# Patient Record
Sex: Female | Born: 1945 | Race: Black or African American | Hispanic: No | Smoking: Never smoker
Health system: Southern US, Community
[De-identification: ages and names within clinical notes are randomized; demographics above are authoritative.]

## PROBLEM LIST (undated history)

## (undated) DIAGNOSIS — I493 Ventricular premature depolarization: Secondary | ICD-10-CM

## (undated) DIAGNOSIS — I491 Atrial premature depolarization: Secondary | ICD-10-CM

## (undated) DIAGNOSIS — Z9289 Personal history of other medical treatment: Secondary | ICD-10-CM

## (undated) DIAGNOSIS — E785 Hyperlipidemia, unspecified: Secondary | ICD-10-CM

## (undated) DIAGNOSIS — I050 Rheumatic mitral stenosis: Secondary | ICD-10-CM

## (undated) DIAGNOSIS — M1990 Unspecified osteoarthritis, unspecified site: Secondary | ICD-10-CM

## (undated) DIAGNOSIS — I340 Nonrheumatic mitral (valve) insufficiency: Secondary | ICD-10-CM

## (undated) DIAGNOSIS — E11319 Type 2 diabetes mellitus with unspecified diabetic retinopathy without macular edema: Secondary | ICD-10-CM

## (undated) DIAGNOSIS — R937 Abnormal findings on diagnostic imaging of other parts of musculoskeletal system: Secondary | ICD-10-CM

## (undated) DIAGNOSIS — N200 Calculus of kidney: Secondary | ICD-10-CM

## (undated) DIAGNOSIS — E669 Obesity, unspecified: Secondary | ICD-10-CM

## (undated) DIAGNOSIS — I421 Obstructive hypertrophic cardiomyopathy: Secondary | ICD-10-CM

## (undated) DIAGNOSIS — E1165 Type 2 diabetes mellitus with hyperglycemia: Secondary | ICD-10-CM

## (undated) DIAGNOSIS — I5032 Chronic diastolic (congestive) heart failure: Secondary | ICD-10-CM

## (undated) DIAGNOSIS — K219 Gastro-esophageal reflux disease without esophagitis: Secondary | ICD-10-CM

## (undated) DIAGNOSIS — I10 Essential (primary) hypertension: Secondary | ICD-10-CM

## (undated) DIAGNOSIS — J309 Allergic rhinitis, unspecified: Secondary | ICD-10-CM

## (undated) DIAGNOSIS — C539 Malignant neoplasm of cervix uteri, unspecified: Secondary | ICD-10-CM

## (undated) HISTORY — DX: Obesity, unspecified: E66.9

## (undated) HISTORY — DX: Personal history of other medical treatment: Z92.89

## (undated) HISTORY — DX: Abnormal findings on diagnostic imaging of other parts of musculoskeletal system: R93.7

## (undated) HISTORY — DX: Hyperlipidemia, unspecified: E78.5

## (undated) HISTORY — PX: OTHER SURGICAL HISTORY: SHX169

## (undated) HISTORY — DX: Allergic rhinitis, unspecified: J30.9

## (undated) HISTORY — DX: Gastro-esophageal reflux disease without esophagitis: K21.9

## (undated) HISTORY — DX: Chronic diastolic (congestive) heart failure: I50.32

## (undated) HISTORY — DX: Essential (primary) hypertension: I10

## (undated) HISTORY — DX: Obstructive hypertrophic cardiomyopathy: I42.1

## (undated) HISTORY — PX: JOINT REPLACEMENT: SHX530

## (undated) HISTORY — DX: Unspecified osteoarthritis, unspecified site: M19.90

## (undated) HISTORY — DX: Calculus of kidney: N20.0

## (undated) HISTORY — PX: ABDOMINAL HYSTERECTOMY: SHX81

## (undated) HISTORY — DX: Type 2 diabetes mellitus with hyperglycemia: E11.65

---

## 1978-05-15 DIAGNOSIS — C539 Malignant neoplasm of cervix uteri, unspecified: Secondary | ICD-10-CM

## 1978-05-15 HISTORY — DX: Malignant neoplasm of cervix uteri, unspecified: C53.9

## 2011-07-24 ENCOUNTER — Ambulatory Visit: Payer: Self-pay | Admitting: Internal Medicine

## 2011-09-10 ENCOUNTER — Encounter: Payer: Self-pay | Admitting: Internal Medicine

## 2011-09-10 DIAGNOSIS — Z0001 Encounter for general adult medical examination with abnormal findings: Secondary | ICD-10-CM | POA: Insufficient documentation

## 2011-09-13 ENCOUNTER — Other Ambulatory Visit (INDEPENDENT_AMBULATORY_CARE_PROVIDER_SITE_OTHER): Payer: Medicare HMO

## 2011-09-13 ENCOUNTER — Encounter: Payer: Self-pay | Admitting: Internal Medicine

## 2011-09-13 ENCOUNTER — Ambulatory Visit (INDEPENDENT_AMBULATORY_CARE_PROVIDER_SITE_OTHER)
Admission: RE | Admit: 2011-09-13 | Discharge: 2011-09-13 | Disposition: A | Discharge status: Home / Self Care | Payer: Medicare HMO | Source: Ambulatory Visit | Attending: Internal Medicine | Admitting: Internal Medicine

## 2011-09-13 ENCOUNTER — Ambulatory Visit (INDEPENDENT_AMBULATORY_CARE_PROVIDER_SITE_OTHER): Payer: Medicare HMO | Admitting: Internal Medicine

## 2011-09-13 ENCOUNTER — Other Ambulatory Visit: Payer: Self-pay | Admitting: Internal Medicine

## 2011-09-13 VITALS — BP 112/82 | HR 94 | Temp 98.3°F | Ht 60.0 in | Wt 226.0 lb

## 2011-09-13 DIAGNOSIS — M1990 Unspecified osteoarthritis, unspecified site: Secondary | ICD-10-CM | POA: Insufficient documentation

## 2011-09-13 DIAGNOSIS — R059 Cough, unspecified: Secondary | ICD-10-CM

## 2011-09-13 DIAGNOSIS — Z0000 Encounter for general adult medical examination without abnormal findings: Secondary | ICD-10-CM

## 2011-09-13 DIAGNOSIS — J4530 Mild persistent asthma, uncomplicated: Secondary | ICD-10-CM | POA: Insufficient documentation

## 2011-09-13 DIAGNOSIS — I10 Essential (primary) hypertension: Secondary | ICD-10-CM

## 2011-09-13 DIAGNOSIS — R05 Cough: Secondary | ICD-10-CM

## 2011-09-13 DIAGNOSIS — I509 Heart failure, unspecified: Secondary | ICD-10-CM

## 2011-09-13 DIAGNOSIS — N200 Calculus of kidney: Secondary | ICD-10-CM | POA: Insufficient documentation

## 2011-09-13 DIAGNOSIS — C539 Malignant neoplasm of cervix uteri, unspecified: Secondary | ICD-10-CM | POA: Insufficient documentation

## 2011-09-13 DIAGNOSIS — M25561 Pain in right knee: Secondary | ICD-10-CM

## 2011-09-13 DIAGNOSIS — K219 Gastro-esophageal reflux disease without esophagitis: Secondary | ICD-10-CM | POA: Insufficient documentation

## 2011-09-13 DIAGNOSIS — I519 Heart disease, unspecified: Secondary | ICD-10-CM | POA: Insufficient documentation

## 2011-09-13 DIAGNOSIS — M25569 Pain in unspecified knee: Secondary | ICD-10-CM

## 2011-09-13 LAB — HEPATIC FUNCTION PANEL
ALT: 20 U/L (ref 0–35)
AST: 18 U/L (ref 0–37)
Albumin: 4.2 g/dL (ref 3.5–5.2)
Alkaline Phosphatase: 66 U/L (ref 39–117)
Total Bilirubin: 0.7 mg/dL (ref 0.3–1.2)

## 2011-09-13 LAB — URINALYSIS, ROUTINE W REFLEX MICROSCOPIC
Bilirubin Urine: NEGATIVE
Hgb urine dipstick: NEGATIVE
Ketones, ur: NEGATIVE
Leukocytes, UA: NEGATIVE
Nitrite: NEGATIVE
Urobilinogen, UA: 0.2 (ref 0.0–1.0)

## 2011-09-13 LAB — CBC WITH DIFFERENTIAL/PLATELET
Basophils Absolute: 0 10*3/uL (ref 0.0–0.1)
Eosinophils Relative: 2.5 % (ref 0.0–5.0)
HCT: 36.7 % (ref 36.0–46.0)
Lymphs Abs: 1.6 10*3/uL (ref 0.7–4.0)
MCV: 83.2 fl (ref 78.0–100.0)
Monocytes Absolute: 0.4 10*3/uL (ref 0.1–1.0)
Neutrophils Relative %: 61.9 % (ref 43.0–77.0)
Platelets: 235 10*3/uL (ref 150.0–400.0)
RDW: 16.6 % — ABNORMAL HIGH (ref 11.5–14.6)
WBC: 5.8 10*3/uL (ref 4.5–10.5)

## 2011-09-13 LAB — LIPID PANEL
Cholesterol: 198 mg/dL (ref 0–200)
HDL: 60.6 mg/dL (ref >39.00)
LDL Cholesterol: 124 mg/dL — ABNORMAL HIGH (ref 0–99)
Total CHOL/HDL Ratio: 3
Triglycerides: 69 mg/dL (ref 0.0–149.0)
VLDL: 13.8 mg/dL (ref 0.0–40.0)

## 2011-09-13 LAB — BASIC METABOLIC PANEL
BUN: 12 mg/dL (ref 6–23)
CO2: 28 mEq/L (ref 19–32)
Chloride: 108 mEq/L (ref 96–112)
Creatinine, Ser: 0.8 mg/dL (ref 0.4–1.2)
Glucose, Bld: 103 mg/dL — ABNORMAL HIGH (ref 70–99)

## 2011-09-13 LAB — TSH: TSH: 0.61 u[IU]/mL (ref 0.35–5.50)

## 2011-09-13 MED ORDER — LOVASTATIN 40 MG PO TABS: 40.0000 mg | ORAL_TABLET | Freq: Every day | ORAL | Status: DC

## 2011-09-13 MED ORDER — MECLIZINE HCL 25 MG PO TABS: 25.0000 mg | ORAL_TABLET | Freq: Three times a day (TID) | ORAL | Status: DC | PRN

## 2011-09-13 MED ORDER — METOPROLOL TARTRATE 50 MG PO TABS: 50.0000 mg | ORAL_TABLET | Freq: Two times a day (BID) | ORAL | Status: DC

## 2011-09-13 MED ORDER — FUROSEMIDE 40 MG PO TABS: 40.0000 mg | ORAL_TABLET | Freq: Two times a day (BID) | ORAL | Status: DC

## 2011-09-13 MED ORDER — ALBUTEROL SULFATE HFA 108 (90 BASE) MCG/ACT IN AERS: 2.0000 | INHALATION_SPRAY | Freq: Two times a day (BID) | RESPIRATORY_TRACT | Status: DC

## 2011-09-13 MED ORDER — VALSARTAN 320 MG PO TABS: 320.0000 mg | ORAL_TABLET | Freq: Every day | ORAL | Status: DC

## 2011-09-13 NOTE — Assessment & Plan Note (Addendum)
Unclear etiology, for cxr today, exam benign

## 2011-09-13 NOTE — Assessment & Plan Note (Signed)
S/p right TKR, last seen per ortho about 2009, severe limp to walk with cane, for ortho eval as well

## 2011-09-13 NOTE — Assessment & Plan Note (Signed)

## 2011-09-13 NOTE — Patient Instructions (Addendum)
Please remember to followup with your GYN for the yearly pap smear and/or mammogram - please consider [facility] (Dr Audie Box and others) as well as [facility] on [address] for the mammogram You will be contacted regarding the referral for: cardiology, orthopedic, and colonoscopy Please go to XRAY in the Basement for the x-ray test Please go to LAB in the Basement for the blood and/or urine tests to be done today You will be contacted by phone if any changes need to be made immediately.  Otherwise, you will receive a letter about your results with an explanation. OK to stop the benicar Please start the diovan 320 mg per day Please increase the lasix to 40 mg twice per day, with the second dose being as needed for persistent swelling Continue all other medications as before You are given the refills today - all sent to the pharmacy Please also consider getting compression stockings at the [facility] on [address], to help the leg swelling Please return in 6 months, or sooner if needed

## 2011-09-13 NOTE — Assessment & Plan Note (Signed)
benicar too expensive - to change to diovan 320 qd, o/w stable BP Readings from Last 3 Encounters:  09/13/11 112/82

## 2011-09-13 NOTE — Assessment & Plan Note (Signed)
With periph edema  - to change the lasix 40 bid with second dose per day prn; refer cardiology

## 2011-09-13 NOTE — Progress Notes (Signed)
Subjective:    Patient ID: Alyssa Ingram, female    DOB: Jul 30, 1945, 66 y.o.   MRN: [id number]  HPI  Here for wellness and to establish as new pt, moved from [facility] [place], there had PCP, cardiologist;  Overall doing ok;  Pt denies CP, worsening SOB, DOE, wheezing, orthopnea, PND, worsening LE edema, palpitations, dizziness or syncope, except has bilat persistent LE swelling.  Pt denies neurological change such as new Headache, facial or extremity weakness.  Pt denies polydipsia, polyuria, or low sugar symptoms. Pt states overall good compliance with treatment and medications, good tolerability, and trying to follow lower cholesterol diet.  Pt denies worsening depressive symptoms, suicidal ideation or panic. No fever, wt loss, night sweats, loss of appetite, or other constitutional symptoms.  Pt states good ability with ADL's, low fall risk, home safety reviewed and adequate, no significant changes in hearing or vision, and occasionally active with exercise. Walks with cane. No other acute complaints.  Pt denies fever, wt loss, night sweats, loss of appetite, or other constitutional . Benicar too expensive - asks for change.   Has hx of GERD but Denies worsening reflux, dysphagia, abd pain, n/v, bowel change or blood.  Does have ongoing nonprod cough however, not clear if related.  No worsening allergy symptoms in recent weeks.   Past Medical History  Diagnosis Date  . Blood in stool   . Heart disease   . Heart murmur   . Arthritis   . Asthma   . Cancer   . GERD (gastroesophageal reflux disease)   . Hypertension   . Kidney stones   . CHF (congestive heart failure)    Past Surgical History  Procedure Date  . Abdominal hysterectomy   . Rotater cuff     right, 2005  . Joint replacement     right knee 2006    reports that she has never smoked. She does not have any smokeless tobacco history on file. She reports that she does not drink alcohol or use illicit drugs. family history includes Alcohol  abuse in her other; Arthritis in her others; Cancer in her other; Diabetes in her others; Heart disease in her others; Hypertension in her others; Kidney disease in her other; Mental illness in her other; and Sudden death in her other. No Known Allergies Current Outpatient Prescriptions on File Prior to Visit  Medication Sig Dispense Refill  . albuterol (VENTOLIN HFA) 108 (90 BASE) MCG/ACT inhaler Inhale 2 puffs into the lungs 2 (two) times daily.  3 Inhaler  3  . furosemide (LASIX) 40 MG tablet Take 1 tablet (40 mg total) by mouth 2 (two) times daily. With second dose prn persistent swelling  180 tablet  3  . metoprolol (LOPRESSOR) 50 MG tablet Take 1 tablet (50 mg total) by mouth 2 (two) times daily.  180 tablet  3  . valsartan (DIOVAN) 320 MG tablet Take 1 tablet (320 mg total) by mouth daily.  90 tablet  3   Review of Systems Review of Systems  Constitutional: Negative for diaphoresis, activity change, appetite change and unexpected weight change.  HENT: Negative for hearing loss, ear pain, facial swelling, mouth sores and neck stiffness.   Eyes: Negative for pain, redness and visual disturbance.  Respiratory: Negative for shortness of breath and wheezing.   Cardiovascular: Negative for chest pain and palpitations.  Gastrointestinal: Negative for diarrhea, blood in stool, abdominal distention and rectal pain.  Genitourinary: Negative for hematuria, flank pain and decreased urine volume.  Musculoskeletal: Negative  for myalgias and joint swelling.  Skin: Negative for color change and wound.  Neurological: Negative for syncope and numbness.  Hematological: Negative for adenopathy.  Psychiatric/Behavioral: Negative for hallucinations, self-injury, decreased concentration and agitation.     Objective:   Physical Exam BP 112/82  Pulse 94  Temp(Src) 98.3 F (36.8 C) (Oral)  Ht 5' (1.524 m)  Wt 226 lb (102.513 kg)  BMI 44.14 kg/m2  SpO2 95% Physical Exam  VS noted Constitutional: Pt  appears well-developed and well-nourished.  HENT: Head: Normocephalic.  Right Ear: External ear normal.  Left Ear: External ear normal.  Eyes: Conjunctivae and EOM are normal. Pupils are equal, round, and reactive to light.  Neck: Normal range of motion. Neck supple.  Cardiovascular: Normal rate and regular rhythm.   Pulmonary/Chest: Effort normal and breath sounds normal.  Abd:  Soft, NT, non-distended, + BS Neurological: Pt is alert. Not confused, motor/sens/dtr intact Skin: Skin is warm. No erythema. Bilat 1+ edema to mid calves Right knee with small effusion, and decreased ROM, very difficult to bear wt and ascend exam table step Psychiatric: Pt behavior is normal. Thought content normal. not overly nervous or depressed affect    Assessment & Plan:

## 2011-10-05 ENCOUNTER — Ambulatory Visit (INDEPENDENT_AMBULATORY_CARE_PROVIDER_SITE_OTHER): Payer: Medicare HMO | Admitting: Cardiology

## 2011-10-05 ENCOUNTER — Encounter: Payer: Self-pay | Admitting: Cardiology

## 2011-10-05 VITALS — BP 116/79 | HR 61 | Wt 220.0 lb

## 2011-10-05 DIAGNOSIS — R0602 Shortness of breath: Secondary | ICD-10-CM

## 2011-10-05 DIAGNOSIS — I10 Essential (primary) hypertension: Secondary | ICD-10-CM

## 2011-10-05 DIAGNOSIS — I509 Heart failure, unspecified: Secondary | ICD-10-CM

## 2011-10-05 MED ORDER — FUROSEMIDE 20 MG PO TABS: 20.0000 mg | ORAL_TABLET | Freq: Two times a day (BID) | ORAL | Status: DC

## 2011-10-05 MED ORDER — FUROSEMIDE 40 MG PO TABS: ORAL_TABLET | ORAL | Status: DC

## 2011-10-05 MED ORDER — FUROSEMIDE 40 MG PO TABS: 40.0000 mg | ORAL_TABLET | Freq: Two times a day (BID) | ORAL | Status: DC

## 2011-10-05 NOTE — Assessment & Plan Note (Signed)
Patient carries the diagnosis of congestive heart failure. We will have all of her previous records forwarded to [place] for review. Continue present medications. She appears mildly volume overloaded. Increase Lasix to 60 mg by mouth twice a day. Check potassium, renal function and BNP in one week.

## 2011-10-05 NOTE — Assessment & Plan Note (Signed)
Blood pressure controlled. Continue present medications. 

## 2011-10-05 NOTE — Patient Instructions (Addendum)
Your physician recommends that you schedule a follow-up appointment in: 8 WEEKS  FUROSEMIDE 40 MG ONE TABLET TWICE DAILY  FUROSEMIDE 20 MG ONE TABLET TWICE DAILY  Your physician recommends that you return for lab work in: ONE WEEK

## 2011-10-05 NOTE — Progress Notes (Signed)
HPI: 66 year old female for evaluation of congestive heart failure. Laboratories May 1 of 2013 revealed normal renal function, potassium 4.2, normal liver functions, hemoglobin 11.8. TSH normal at 0.61. Chest x-ray showed cardiac enlargement and pulmonary vascular congestion. Patient previously lived in [place]. She moved here approximately one month ago. She carries the diagnosis of congestive heart failure. I do not have those records available. She states she had a weak heart muscle previously. She has not had a cardiac catheterization by her report. She has mild dyspnea on exertion but no orthopnea or PND. She has chronic pedal edema which is slightly worse recently. She occasionally has brief chest pain for which she takes an aspirin. No exertional symptoms.  Current Outpatient Prescriptions  Medication Sig Dispense Refill  . albuterol (VENTOLIN HFA) 108 (90 BASE) MCG/ACT inhaler Inhale 2 puffs into the lungs 2 (two) times daily.  3 Inhaler  3  . aspirin 81 MG tablet Take 81 mg by mouth daily.      . furosemide (LASIX) 40 MG tablet Take 1 tablet (40 mg total) by mouth 2 (two) times daily. With second dose prn persistent swelling  180 tablet  3  . lovastatin (MEVACOR) 20 MG tablet Take 40 mg by mouth at bedtime.      . meclizine (ANTIVERT) 25 MG tablet Take 1 tablet (25 mg total) by mouth 3 (three) times daily as needed.  60 tablet  2  . metoprolol (LOPRESSOR) 50 MG tablet Take 1 tablet (50 mg total) by mouth 2 (two) times daily.  180 tablet  3  . olmesartan (BENICAR) 40 MG tablet Take 40 mg by mouth daily.        No Known Allergies  Past Medical History  Diagnosis Date  . Arthritis   . Asthma   . Cancer   . GERD (gastroesophageal reflux disease)   . Hypertension   . Kidney stones   . CHF (congestive heart failure)   . Hyperlipidemia     Past Surgical History  Procedure Date  . Abdominal hysterectomy   . Rotater cuff     right, 2005  . Joint replacement     right knee 2006    . Nephrolithiasis     History   Social History  . Marital Status: Married    Spouse Name: N/A    Number of Children: 5  . Years of Education: 14   Occupational History  . Retired Airline pilot    Social History Main Topics  . Smoking status: Never Smoker   . Smokeless tobacco: Not on file  . Alcohol Use: No  . Drug Use: No  . Sexually Active: Not on file   Other Topics Concern  . Not on file   Social History Narrative  . No narrative on file    Family History  Problem Relation Age of Onset  . Arthritis Other   . Heart disease Other   . Hypertension Other   . Diabetes Other   . Alcohol abuse Other   . Arthritis Other   . Cancer Other     Lung Cancer  . Heart disease Other   . Hypertension Other   . Sudden death Other   . Kidney disease Other   . Mental illness Other   . Diabetes Other     ROS: no fevers or chills, productive cough, hemoptysis, dysphasia, odynophagia, melena, hematochezia, dysuria, hematuria, rash, seizure activity, orthopnea, PND, claudication. Remaining systems are negative.  Physical Exam:   Blood pressure 116/79, pulse  61, weight 99.791 kg (220 lb).  General:  Well developed/obese in NAD Skin warm/dry Patient not depressed No peripheral clubbing Back-normal HEENT-normal/normal eyelids Neck supple/normal carotid upstroke bilaterally; no bruits; no JVD; no thyromegaly chest - CTA/ normal expansion CV - RRR/normal S1 and S2; no rubs or gallops;  PMI nondisplaced, 2/6 systolic murmur left sternal border. Increases with Valsalva. Abdomen -NT/ND, no HSM, no mass, + bowel sounds, no bruit 2+ femoral pulses, no bruits Ext-no chords, 2+ DP, 1+ pedal edema bilaterally, chronic skin discoloration. Neuro-grossly nonfocal  ECG normal sinus rhythm at a rate of 61. Right axis deviation. Left ventricular hypertrophy with repolarization abnormality. Inferior T-wave inversion.

## 2011-10-12 ENCOUNTER — Other Ambulatory Visit (INDEPENDENT_AMBULATORY_CARE_PROVIDER_SITE_OTHER): Payer: Medicare HMO

## 2011-10-12 DIAGNOSIS — R0602 Shortness of breath: Secondary | ICD-10-CM

## 2011-10-12 LAB — BASIC METABOLIC PANEL
BUN: 33 mg/dL — ABNORMAL HIGH (ref 6–23)
CO2: 28 mEq/L (ref 19–32)
Chloride: 105 mEq/L (ref 96–112)
Creatinine, Ser: 1.1 mg/dL (ref 0.4–1.2)
Potassium: 4.3 mEq/L (ref 3.5–5.1)

## 2011-10-12 LAB — BRAIN NATRIURETIC PEPTIDE: Pro B Natriuretic peptide (BNP): 167 pg/mL — ABNORMAL HIGH (ref 0.0–100.0)

## 2011-10-17 NOTE — Progress Notes (Signed)
Addended by: Reine Just on: 10/17/2011 07:45 PM   Modules accepted: Orders

## 2011-10-20 ENCOUNTER — Encounter: Payer: Self-pay | Admitting: Internal Medicine

## 2011-11-13 DIAGNOSIS — R937 Abnormal findings on diagnostic imaging of other parts of musculoskeletal system: Secondary | ICD-10-CM

## 2011-11-13 HISTORY — DX: Abnormal findings on diagnostic imaging of other parts of musculoskeletal system: R93.7

## 2011-11-14 ENCOUNTER — Ambulatory Visit (AMBULATORY_SURGERY_CENTER): Payer: Medicare HMO

## 2011-11-14 VITALS — Ht 60.0 in | Wt 218.5 lb

## 2011-11-14 DIAGNOSIS — Z1211 Encounter for screening for malignant neoplasm of colon: Secondary | ICD-10-CM

## 2011-11-14 MED ORDER — MOVIPREP 100 G PO SOLR: ORAL | Status: DC

## 2011-11-14 NOTE — Progress Notes (Signed)
Pt came into office for her PV today.I advised pt to bring her inhaler on the day of her procedure. She understood.Ulis Rias RN

## 2011-11-15 ENCOUNTER — Encounter: Payer: Self-pay | Admitting: Internal Medicine

## 2011-11-20 ENCOUNTER — Telehealth: Payer: Self-pay | Admitting: Internal Medicine

## 2011-11-20 NOTE — Telephone Encounter (Signed)
Caller: Alyssa Ingram/Patient; PCP: Oliver Barre; CB#: [phone];  Call regarding Aching Pain L Lower back Side above hip- onset 11/18/11. Afebrile. No urinary sx. She is taking Tylenol 2 tab PO every 4 hours during the day but not really helping. Only thing that helps is to lay down. Pain is not waking her up at night. Started on Lovastatin- 2 months ago. Pain is worse with standing or walking and pain is severe when she gets up and sits down - low back. SHE IS WONDERING IF PAIN MED CAN BE CALLED IN USES [facility] ON [address]. Advised that MD would want to see her first. Triage per Back Sx Protocol and appnt advised within 72 hours "for Moderate pain in the back with normal activity or rest AND not responding to 72 hours of home care". Appnt scheduled for 11/22/11 @ 1600. Advised that if pain worsens to call back.

## 2011-11-20 NOTE — Telephone Encounter (Signed)
Agree with need for OV. 

## 2011-11-21 NOTE — Telephone Encounter (Signed)
Called the patient and she is scheduled on July 10 at 4.  Informed would call if any canceled appts. Today or tomorrow in order to be seen sooner.

## 2011-11-22 ENCOUNTER — Ambulatory Visit (INDEPENDENT_AMBULATORY_CARE_PROVIDER_SITE_OTHER): Payer: Medicare HMO | Admitting: Internal Medicine

## 2011-11-22 ENCOUNTER — Encounter: Payer: Self-pay | Admitting: Internal Medicine

## 2011-11-22 VITALS — BP 114/78 | HR 64 | Temp 101.3°F | Wt 215.0 lb

## 2011-11-22 DIAGNOSIS — IMO0002 Reserved for concepts with insufficient information to code with codable children: Secondary | ICD-10-CM

## 2011-11-22 DIAGNOSIS — I10 Essential (primary) hypertension: Secondary | ICD-10-CM

## 2011-11-22 DIAGNOSIS — M5416 Radiculopathy, lumbar region: Secondary | ICD-10-CM

## 2011-11-22 DIAGNOSIS — K219 Gastro-esophageal reflux disease without esophagitis: Secondary | ICD-10-CM

## 2011-11-22 MED ORDER — CYCLOBENZAPRINE HCL 5 MG PO TABS: 5.0000 mg | ORAL_TABLET | Freq: Three times a day (TID) | ORAL | Status: AC | PRN

## 2011-11-22 MED ORDER — OXYCODONE HCL 5 MG PO TABS: ORAL_TABLET | ORAL | Status: DC

## 2011-11-22 MED ORDER — PREDNISONE 10 MG PO TABS: ORAL_TABLET | ORAL | Status: DC

## 2011-11-22 MED ORDER — ESOMEPRAZOLE MAGNESIUM 40 MG PO CPDR: 40.0000 mg | DELAYED_RELEASE_CAPSULE | Freq: Every day | ORAL | Status: DC

## 2011-11-22 NOTE — Assessment & Plan Note (Signed)
Ok for PPI - tx with nexium per pt preference,  to f/u any worsening symptoms or concerns

## 2011-11-22 NOTE — Progress Notes (Addendum)
Subjective:    Patient ID: Alyssa Ingram, female    DOB: [birth year], 66 y.o.   MRN: [id number]  HPI Here to f/u with acute onset 4 day severe persistent left lower back pain, may have started after moving mattress at home;  With any little movement such as shifting in the chair while sitting causes severe flare, with radiation of pain to below the left knee, with weakness and tingling to the foot, but no bowel or bladder change, fever, wt loss, or falls.  Friend drove her her.  A bit desparate today for pain control and further eval and tx asap.  Pain 10/10 at worst, only less pain to sitting as still as possible.  Nothing else makes better or worse.  No prior hx of lumbar problem.  Has seen cardiology, edema now resolved, no orthostasis symptoms.  Has colonoscopy scheduled soon. Past Medical History  Diagnosis Date  . Arthritis   . Asthma   . Cancer   . GERD (gastroesophageal reflux disease)   . Hypertension   . Kidney stones   . CHF (congestive heart failure)   . Hyperlipidemia    Past Surgical History  Procedure Date  . Abdominal hysterectomy   . Rotater cuff     right, 2005  . Joint replacement     right knee 2006  . Nephrolithiasis     reports that she has never smoked. She has never used smokeless tobacco. She reports that she does not drink alcohol or use illicit drugs. family history includes Alcohol abuse in her other; Arthritis in her others; Cancer in her other; Diabetes in her others; Heart disease in her father and others; Hypertension in her others; Kidney disease in her other; Mental illness in her other; and Sudden death in her other. No Known Allergies Current Outpatient Prescriptions on File Prior to Visit  Medication Sig Dispense Refill  . albuterol (VENTOLIN HFA) 108 (90 BASE) MCG/ACT inhaler Inhale 2 puffs into the lungs 2 (two) times daily.  3 Inhaler  3  . aspirin 81 MG tablet Take 81 mg by mouth. Take 2 pills daily      . furosemide (LASIX) 20 MG tablet Take 1  tablet (20 mg total) by mouth 2 (two) times daily.  60 tablet  12  . furosemide (LASIX) 40 MG tablet Take 40 mg by mouth 2 (two) times daily.      Marland Kitchen lovastatin (MEVACOR) 20 MG tablet Take 40 mg by mouth at bedtime.      . meclizine (ANTIVERT) 25 MG tablet Take by mouth. Take 2 tablets tid prn      . metoprolol (LOPRESSOR) 50 MG tablet Take 1 tablet (50 mg total) by mouth 2 (two) times daily.  180 tablet  3  . MOVIPREP 100 G SOLR Moviprep as directed /no substitutions  1 kit  0  . valsartan (DIOVAN) 320 MG tablet Take 320 mg by mouth daily.      Marland Kitchen esomeprazole (NEXIUM) 40 MG capsule Take 1 capsule (40 mg total) by mouth daily.  90 capsule  3  . olmesartan (BENICAR) 40 MG tablet Take 40 mg by mouth daily.       Review of Systems Review of Systems  Constitutional: Negative for diaphoresis and unexpected weight change.  HENT: Negative for drooling and tinnitus.   Eyes: Negative for photophobia and visual disturbance.  Respiratory: Negative for choking and stridor.   Gastrointestinal: Negative for vomiting and blood in stool. , but has had worsening reflux in  the past month, but Denies dysphagia, abd pain, n/v, bowel change or blood. Genitourinary: Negative for hematuria and decreased urine volume.  Musculoskeletal: Negative for acute joint swelling Skin: Negative for color change and wound.  Neurological: Negative for tremor.  Psychiatric/Behavioral: Negative for decreased concentration. The patient is not hyperactive.      Objective:   Physical Exam BP 114/78  Pulse 64  Temp 101.3 F (38.5 C) (Oral)  Wt 215 lb (97.523 kg)  SpO2 93% Physical Exam  VS noted, not ill appearing but in pain sitting in wheelchair to avoid walking or standing Constitutional: Pt appears well-developed and well-nourished.  HENT: Head: Normocephalic.  Right Ear: External ear normal.  Left Ear: External ear normal.  Eyes: Conjunctivae and EOM are normal. Pupils are equal, round, and reactive to light.  Neck:  Normal range of motion. Neck supple.  Cardiovascular: Normal rate and regular rhythm.   Pulmonary/Chest: Effort normal and breath sounds normal.  Abd:  Soft, NT, non-distended, + BS Spine: nontender, no lumbar paravertebral tender Neurological: Pt is alert. No cranial nerve deficit. motor/dtr/sens intact except for LLE 4+/5 distal LLE weakness; slr and gait not tested  - deferred per pt Skin: Skin is warm. No erythema. No rash LE edema  - none Psychiatric: Pt behavior is normal. Thought content normal. Not depressed affect     Assessment & Plan:

## 2011-11-22 NOTE — Progress Notes (Deleted)
  Subjective:    Patient ID: Alyssa Ingram, female    DOB: 27-Dec-1945, 66 y.o.   MRN: [id number]  HPI    Review of Systems     Objective:   Physical Exam        Assessment & Plan:

## 2011-11-22 NOTE — Patient Instructions (Addendum)
Take all new medications as prescribed - the oxycodone, muscle relaxer, prednisone, and nexium Continue all other medications as before You will be contacted regarding the referral for: MRI for the lower back, and Neurosurgury referral You will probably want to re-schedule the Colonoscopy, unless your symptoms rapidly improve

## 2011-11-22 NOTE — Assessment & Plan Note (Signed)
stable overall by hx and exam, most recent data reviewed with pt, and pt to continue medical treatment as before BP Readings from Last 3 Encounters:  11/22/11 114/78  10/05/11 116/79  09/13/11 112/82

## 2011-11-22 NOTE — Assessment & Plan Note (Signed)
Acute new onset with severe pain, for pain control, empiric muscle relaxer prn, predpack trial, MRI LS spine and NS referral,  to f/u any worsening symptoms or concerns

## 2011-11-24 ENCOUNTER — Telehealth: Payer: Self-pay | Admitting: Internal Medicine

## 2011-11-24 DIAGNOSIS — M5416 Radiculopathy, lumbar region: Secondary | ICD-10-CM

## 2011-11-24 NOTE — Telephone Encounter (Signed)
Called the patient informed of all information.  She is agreeable with an ortho referral.

## 2011-11-24 NOTE — Telephone Encounter (Signed)
Robin to let pt know -  Her insurance is not accepted by the [facility] group in town (the only group in town), and we may have problem getting the MRI due to her insurance is based in [place] and we are "out of network" with her evaluation and tx here  I will refer to Orthopedic, who much more likely to accept her insurance, even if she is unable to have the MRI first - new order done

## 2011-11-28 ENCOUNTER — Encounter: Payer: Medicare HMO | Admitting: Internal Medicine

## 2011-12-05 ENCOUNTER — Ambulatory Visit (HOSPITAL_COMMUNITY)
Admission: RE | Admit: 2011-12-05 | Discharge: 2011-12-05 | Disposition: A | Discharge status: Home / Self Care | Payer: Medicare HMO | Source: Ambulatory Visit | Attending: Internal Medicine | Admitting: Internal Medicine

## 2011-12-05 ENCOUNTER — Other Ambulatory Visit: Payer: Self-pay | Admitting: Internal Medicine

## 2011-12-05 DIAGNOSIS — R29898 Other symptoms and signs involving the musculoskeletal system: Secondary | ICD-10-CM | POA: Insufficient documentation

## 2011-12-05 DIAGNOSIS — M4646 Discitis, unspecified, lumbar region: Secondary | ICD-10-CM

## 2011-12-05 DIAGNOSIS — Q762 Congenital spondylolisthesis: Secondary | ICD-10-CM | POA: Insufficient documentation

## 2011-12-05 DIAGNOSIS — M48061 Spinal stenosis, lumbar region without neurogenic claudication: Secondary | ICD-10-CM | POA: Insufficient documentation

## 2011-12-05 DIAGNOSIS — M5416 Radiculopathy, lumbar region: Secondary | ICD-10-CM

## 2011-12-05 DIAGNOSIS — M545 Low back pain, unspecified: Secondary | ICD-10-CM | POA: Insufficient documentation

## 2011-12-05 DIAGNOSIS — I509 Heart failure, unspecified: Secondary | ICD-10-CM | POA: Insufficient documentation

## 2011-12-05 NOTE — Telephone Encounter (Signed)
MRI with prob l5-S1 infectious discitis, also ? Abnormal marrow reaction                                            1) blood cultures x 2 bottles  - NOW                                           2)  Blood tests  - esr, crp, cbc, bmet, lft's, Urine with cx, spep                                           3)  CT guided needle biopsy per IR                                            3)  ID consult;  Hold IV or oral antibx at this time pending results Robin to inform pt, I will do orders

## 2011-12-06 ENCOUNTER — Other Ambulatory Visit: Payer: Self-pay | Admitting: Internal Medicine

## 2011-12-06 ENCOUNTER — Telehealth (HOSPITAL_COMMUNITY): Payer: Self-pay

## 2011-12-06 DIAGNOSIS — M4646 Discitis, unspecified, lumbar region: Secondary | ICD-10-CM

## 2011-12-06 NOTE — Telephone Encounter (Signed)
Received a call from [facility] in regards to scheduling pt in IR for Bone bx.  Dr. Corliss Skains agreed to proceed with procedure// I have called all the numbers listed for the pt. I left a vm on her cell asking to call me back.  I have scheduled for Friday

## 2011-12-07 ENCOUNTER — Other Ambulatory Visit: Payer: Self-pay | Admitting: Internal Medicine

## 2011-12-07 ENCOUNTER — Telehealth (HOSPITAL_COMMUNITY): Payer: Self-pay

## 2011-12-07 ENCOUNTER — Encounter: Payer: Self-pay | Admitting: Internal Medicine

## 2011-12-07 ENCOUNTER — Encounter: Payer: Self-pay | Admitting: Cardiology

## 2011-12-07 ENCOUNTER — Ambulatory Visit: Payer: Medicare HMO

## 2011-12-07 ENCOUNTER — Ambulatory Visit (INDEPENDENT_AMBULATORY_CARE_PROVIDER_SITE_OTHER): Payer: Medicare HMO | Admitting: Cardiology

## 2011-12-07 VITALS — BP 104/72 | HR 66 | Wt 217.0 lb

## 2011-12-07 DIAGNOSIS — M4646 Discitis, unspecified, lumbar region: Secondary | ICD-10-CM

## 2011-12-07 DIAGNOSIS — I509 Heart failure, unspecified: Secondary | ICD-10-CM

## 2011-12-07 DIAGNOSIS — I10 Essential (primary) hypertension: Secondary | ICD-10-CM

## 2011-12-07 LAB — CBC WITH DIFFERENTIAL/PLATELET
Basophils Absolute: 0 10*3/uL (ref 0.0–0.1)
Eosinophils Absolute: 0.2 10*3/uL (ref 0.0–0.7)
Lymphocytes Relative: 30.9 % (ref 12.0–46.0)
MCHC: 32 g/dL (ref 30.0–36.0)
Neutro Abs: 3.2 10*3/uL (ref 1.4–7.7)
Neutrophils Relative %: 59.3 % (ref 43.0–77.0)
RDW: 15.6 % — ABNORMAL HIGH (ref 11.5–14.6)

## 2011-12-07 LAB — URINALYSIS, ROUTINE W REFLEX MICROSCOPIC
Hgb urine dipstick: NEGATIVE
Total Protein, Urine: NEGATIVE
Urine Glucose: NEGATIVE

## 2011-12-07 LAB — HEPATIC FUNCTION PANEL
Albumin: 3.8 g/dL (ref 3.5–5.2)
Alkaline Phosphatase: 64 U/L (ref 39–117)
Bilirubin, Direct: 0 mg/dL (ref 0.0–0.3)

## 2011-12-07 LAB — BASIC METABOLIC PANEL
CO2: 26 mEq/L (ref 19–32)
Chloride: 103 mEq/L (ref 96–112)
Creatinine, Ser: 0.9 mg/dL (ref 0.4–1.2)

## 2011-12-07 NOTE — Patient Instructions (Addendum)
Your physician recommends that you schedule a follow-up appointment in: 3 MONTHS WITH DR CRENSHAW  Your physician has requested that you have an echocardiogram. Echocardiography is a painless test that uses sound waves to create images of your heart. It provides your doctor with information about the size and shape of your heart and how well your heart's chambers and valves are working. This procedure takes approximately one hour. There are no restrictions for this procedure.    

## 2011-12-07 NOTE — Telephone Encounter (Signed)
I have called the pt again in regards to setting up the apt.

## 2011-12-07 NOTE — Progress Notes (Signed)
HPI: 66 year old female I initially saw in May of 2013 for evaluation of congestive heart failure. Laboratories May 1 of 2013 revealed normal renal function, potassium 4.2, normal liver functions, hemoglobin 11.8. TSH normal at 0.61. Chest x-ray showed cardiac enlargement and pulmonary vascular congestion. Patient previously lived in [place]. Records are now available for review. Nuclear study in November of 2011 showed an ejection fraction of 62% and normal perfusion. Last echocardiogram in May 2012 showed mild left atrial enlargement, asymmetric septal hypertrophy with normal LV function, and mild mitral regurgitation. There was 2+ tricuspid regurgitation and moderate pulmonary hypertension. There was mild LV dysfunction. There is a sub-basilar aortic outflow gradient of 35-40 mm of mercury. She carries a diagnosis of hypertrophic nonobstructive cardiomyopathy. When I saw her previously we increased her Lasix to 60 mg by mouth twice a day. Since then, she has some dyspnea on exertion but improved. No orthopnea or PND. No chest pain. Her pedal edema has improved.   Current Outpatient Prescriptions  Medication Sig Dispense Refill  . albuterol (VENTOLIN HFA) 108 (90 BASE) MCG/ACT inhaler Inhale 2 puffs into the lungs 2 (two) times daily.  3 Inhaler  3  . aspirin 81 MG tablet Take 81 mg by mouth. Take 2 pills daily      . esomeprazole (NEXIUM) 40 MG capsule Take 1 capsule (40 mg total) by mouth daily.  90 capsule  3  . furosemide (LASIX) 20 MG tablet Take 1 tablet (20 mg total) by mouth 2 (two) times daily.  60 tablet  12  . furosemide (LASIX) 40 MG tablet Take 40 mg by mouth 2 (two) times daily.      Marland Kitchen lovastatin (MEVACOR) 20 MG tablet Take 40 mg by mouth at bedtime.      . meclizine (ANTIVERT) 25 MG tablet Take by mouth. Take 2 tablets tid prn      . metoprolol (LOPRESSOR) 50 MG tablet Take 1 tablet (50 mg total) by mouth 2 (two) times daily.  180 tablet  3  . olmesartan (BENICAR) 40 MG tablet  Take 40 mg by mouth daily.      Marland Kitchen oxyCODONE (OXY IR/ROXICODONE) 5 MG immediate release tablet 1-2 tabs by mouth every 4-6 hours, with maximum 8 tabs per day  60 tablet  0  . valsartan (DIOVAN) 320 MG tablet Take 320 mg by mouth daily.         Past Medical History  Diagnosis Date  . Arthritis   . Asthma   . Cancer   . GERD (gastroesophageal reflux disease)   . Hypertension   . Kidney stones   . CHF (congestive heart failure)   . Hyperlipidemia   . Hypertrophic nonobstructive cardiomyopathy     Past Surgical History  Procedure Date  . Abdominal hysterectomy   . Rotater cuff     right, 2005  . Joint replacement     right knee 2006  . Nephrolithiasis     History   Social History  . Marital Status: Married    Spouse Name: N/A    Number of Children: 5  . Years of Education: 14   Occupational History  . Retired Airline pilot    Social History Main Topics  . Smoking status: Never Smoker   . Smokeless tobacco: Never Used  . Alcohol Use: No  . Drug Use: No  . Sexually Active: Not on file   Other Topics Concern  . Not on file   Social History Narrative  . No narrative on  file    ROS: patient having problems with back pain which is presently being evaluated but no fevers or chills, productive cough, hemoptysis, dysphasia, odynophagia, melena, hematochezia, dysuria, hematuria, rash, seizure activity, orthopnea, PND,  claudication. Remaining systems are negative.  Physical Exam: Well-developed obese in no acute distress.  Skin is warm and dry.  HEENT is normal.  Neck is supple.  Chest is clear to auscultation with normal expansion.  Cardiovascular exam is regular rate and rhythm. 2/6 systolic murmur left sternal border. Abdominal exam nontender or distended. No masses palpated. Extremities show trace edema. neuro grossly intact

## 2011-12-07 NOTE — Assessment & Plan Note (Signed)
Blood pressure controlled. Continue present medications. 

## 2011-12-07 NOTE — Assessment & Plan Note (Addendum)
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 Plan repeat echocardiogram. Her symptoms are much improved on higher dose Lasix. Continue 60 mg twice a day. Check potassium and renal function. Continue beta blocker. If she does have hypertrophic cardiomyopathy she will need further risk stratification for sudden death such as ETT, holter, etc.

## 2011-12-07 NOTE — Telephone Encounter (Signed)
I checked the pt's chart to get any other info to contact her.  I saw that she has an apt at [facility] this morning.  I called and spoke to [place].  They are going to ask the pt to give me a call if she shows up there in order to get the apt scheduled for Friday morning.

## 2011-12-08 ENCOUNTER — Telehealth (HOSPITAL_COMMUNITY): Payer: Self-pay

## 2011-12-08 ENCOUNTER — Other Ambulatory Visit: Payer: Self-pay | Admitting: Radiology

## 2011-12-08 NOTE — Telephone Encounter (Signed)
I just tried to call Alyssa Ingram again at 9:29.  I got her VM.  I left another message for her to call me.

## 2011-12-08 NOTE — Telephone Encounter (Signed)
Alyssa Ingram finally called me back.  I have her scheduled for Tuesday.  She agreed.  She asked me to call Dr. Tresa Endo office to find out if she needed to go to the ortho apt today.  i called the office and was transferred to Renee's vm.  I left a vm in regards to this.

## 2011-12-08 NOTE — Telephone Encounter (Signed)
I rec'd a vm from [place] with [facility] in regards to pt.  She wanted to know if I was handling the BX.  I called her back and left her a message stating that I have been trying to reach the pt.  Please the the chart notes in regards to the steps taken to reach her.  Corrie Dandy just called back and I let her know the same info as the vm.

## 2011-12-11 LAB — SPEP & IFE WITH QIG
Albumin ELP: 53.8 % — ABNORMAL LOW (ref 55.8–66.1)
Beta Globulin: 5.4 % (ref 4.7–7.2)
IgA: 404 mg/dL — ABNORMAL HIGH (ref 69–380)
IgG (Immunoglobin G), Serum: 1460 mg/dL (ref 690–1700)
IgM, Serum: 81 mg/dL (ref 52–322)
Total Protein, Serum Electrophoresis: 7.1 g/dL (ref 6.0–8.3)

## 2011-12-12 ENCOUNTER — Ambulatory Visit (HOSPITAL_COMMUNITY)
Admission: RE | Admit: 2011-12-12 | Discharge: 2011-12-12 | Disposition: A | Discharge status: Home / Self Care | Payer: Medicare HMO | Source: Ambulatory Visit | Attending: Internal Medicine | Admitting: Internal Medicine

## 2011-12-12 ENCOUNTER — Encounter (HOSPITAL_COMMUNITY): Payer: Self-pay

## 2011-12-12 DIAGNOSIS — M4646 Discitis, unspecified, lumbar region: Secondary | ICD-10-CM

## 2011-12-12 DIAGNOSIS — E785 Hyperlipidemia, unspecified: Secondary | ICD-10-CM | POA: Insufficient documentation

## 2011-12-12 DIAGNOSIS — M545 Low back pain, unspecified: Secondary | ICD-10-CM | POA: Insufficient documentation

## 2011-12-12 DIAGNOSIS — K219 Gastro-esophageal reflux disease without esophagitis: Secondary | ICD-10-CM | POA: Insufficient documentation

## 2011-12-12 DIAGNOSIS — I10 Essential (primary) hypertension: Secondary | ICD-10-CM | POA: Insufficient documentation

## 2011-12-12 LAB — CBC
MCV: 82.4 fL (ref 78.0–100.0)
Platelets: 209 10*3/uL (ref 150–400)
RBC: 4.72 MIL/uL (ref 3.87–5.11)
WBC: 5.7 10*3/uL (ref 4.0–10.5)

## 2011-12-12 LAB — PROTIME-INR
INR: 1.03 (ref 0.00–1.49)
Prothrombin Time: 13.7 seconds (ref 11.6–15.2)

## 2011-12-12 LAB — APTT: aPTT: 32 seconds (ref 24–37)

## 2011-12-12 MED ORDER — MIDAZOLAM HCL 5 MG/5ML IJ SOLN
INTRAMUSCULAR | Status: AC | PRN
  Administered 2011-12-12 (×3): 1 mg via INTRAVENOUS

## 2011-12-12 MED ORDER — ONDANSETRON HCL 4 MG/2ML IJ SOLN
INTRAMUSCULAR | Status: AC
  Administered 2011-12-12: 4 mg via INTRAVENOUS
  Filled 2011-12-12: qty 2

## 2011-12-12 MED ORDER — SODIUM CHLORIDE 0.9 % IV SOLN
Freq: Once | INTRAVENOUS | Status: AC
  Administered 2011-12-12: 20 mL/h via INTRAVENOUS

## 2011-12-12 MED ORDER — PROMETHAZINE HCL 25 MG/ML IJ SOLN
12.5000 mg | Freq: Once | INTRAMUSCULAR | Status: AC
  Administered 2011-12-12: 12.5 mg via INTRAVENOUS
  Filled 2011-12-12: qty 1

## 2011-12-12 MED ORDER — NALOXONE HCL 0.4 MG/ML IJ SOLN
INTRAMUSCULAR | Status: AC
  Filled 2011-12-12: qty 1

## 2011-12-12 MED ORDER — FLUMAZENIL 0.5 MG/5ML IV SOLN
INTRAVENOUS | Status: AC
  Filled 2011-12-12: qty 5

## 2011-12-12 MED ORDER — FENTANYL CITRATE 0.05 MG/ML IJ SOLN
INTRAMUSCULAR | Status: AC | PRN
  Administered 2011-12-12 (×3): 25 ug via INTRAVENOUS

## 2011-12-12 MED ORDER — HYDROMORPHONE HCL PF 1 MG/ML IJ SOLN
INTRAMUSCULAR | Status: AC
  Filled 2011-12-12: qty 1

## 2011-12-12 MED ORDER — HYDROMORPHONE HCL PF 1 MG/ML IJ SOLN: 1.0000 mg | Freq: Once | INTRAMUSCULAR | Status: DC

## 2011-12-12 MED ORDER — MIDAZOLAM HCL 2 MG/2ML IJ SOLN
INTRAMUSCULAR | Status: AC
  Filled 2011-12-12: qty 6

## 2011-12-12 MED ORDER — SODIUM CHLORIDE 0.9 % IV SOLN: INTRAVENOUS | Status: AC

## 2011-12-12 MED ORDER — HYDROMORPHONE HCL PF 1 MG/ML IJ SOLN
INTRAMUSCULAR | Status: AC | PRN
  Administered 2011-12-12: 1 mg via INTRAVENOUS
  Administered 2011-12-12: 1 mg

## 2011-12-12 MED ORDER — PANTOPRAZOLE SODIUM 40 MG PO TBEC
40.0000 mg | DELAYED_RELEASE_TABLET | ORAL | Status: AC
  Administered 2011-12-12: 40 mg via ORAL
  Filled 2011-12-12: qty 1

## 2011-12-12 MED ORDER — NALOXONE HCL 1 MG/ML IJ SOLN
INTRAMUSCULAR | Status: AC
  Filled 2011-12-12: qty 2

## 2011-12-12 MED ORDER — ONDANSETRON HCL 4 MG/2ML IJ SOLN
4.0000 mg | Freq: Once | INTRAMUSCULAR | Status: AC
  Administered 2011-12-12: 4 mg via INTRAVENOUS

## 2011-12-12 MED ORDER — FENTANYL CITRATE 0.05 MG/ML IJ SOLN
INTRAMUSCULAR | Status: AC
  Filled 2011-12-12: qty 6

## 2011-12-12 NOTE — H&P (Signed)
Chief Complaint: BAck pain Referring Physician:James John HPI: Alyssa Ingram is an 66 y.o. female who has been worked up for back pain. She had an MRI which showed evidence of L5-S1 diskitis/osteomyelitis and disc degeneration. She has been referred for disc aspiration/biopsy. She has also been seen by Cardiology for cardiomyopathy and CHF and has an Echo next week. She denies current CP or SOB. She does have some DOE. She reports a mild intermittent dry cough recently but no fever or associated sxs. She otherwise has no c/o this am.  Past Medical History:  Past Medical History  Diagnosis Date  . Arthritis   . Asthma   . Cancer   . GERD (gastroesophageal reflux disease)   . Hypertension   . Kidney stones   . CHF (congestive heart failure)   . Hyperlipidemia   . Hypertrophic nonobstructive cardiomyopathy     Past Surgical History:  Past Surgical History  Procedure Date  . Abdominal hysterectomy   . Rotater cuff     right, 2005  . Joint replacement     right knee 2006  . Nephrolithiasis     Family History:  Family History  Problem Relation Age of Onset  . Arthritis Other   . Heart disease Other   . Hypertension Other   . Diabetes Other   . Alcohol abuse Other   . Arthritis Other   . Cancer Other     Lung Cancer  . Heart disease Other   . Hypertension Other   . Sudden death Other   . Kidney disease Other   . Mental illness Other   . Diabetes Other   . Heart disease Father     Social History:  reports that she has never smoked. She has never used smokeless tobacco. She reports that she does not drink alcohol or use illicit drugs.  Allergies: No Known Allergies  Medications: Current Outpatient Prescriptions   Medication  Sig  Dispense  Refill   .  albuterol (VENTOLIN HFA) 108 (90 BASE) MCG/ACT inhaler  Inhale 2 puffs into the lungs 2 (two) times daily.  3 Inhaler  3   .  aspirin 81 MG tablet  Take 81 mg by mouth. Take 2 pills daily     .  esomeprazole (NEXIUM)  40 MG capsule  Take 1 capsule (40 mg total) by mouth daily.  90 capsule  3   .  furosemide (LASIX) 20 MG tablet  Take 1 tablet (20 mg total) by mouth 2 (two) times daily.  60 tablet  12   .  furosemide (LASIX) 40 MG tablet  Take 40 mg by mouth 2 (two) times daily.     Marland Kitchen  lovastatin (MEVACOR) 20 MG tablet  Take 40 mg by mouth at bedtime.     .  meclizine (ANTIVERT) 25 MG tablet  Take by mouth. Take 2 tablets tid prn     .  metoprolol (LOPRESSOR) 50 MG tablet  Take 1 tablet (50 mg total) by mouth 2 (two) times daily.  180 tablet  3   .  olmesartan (BENICAR) 40 MG tablet  Take 40 mg by mouth daily.     Marland Kitchen  oxyCODONE (OXY IR/ROXICODONE) 5 MG immediate release tablet  1-2 tabs by mouth every 4-6 hours, with maximum 8 tabs per day  60 tablet  0   .  valsartan (DIOVAN) 320 MG tablet  Take 320 mg by mouth daily.        Please HPI for pertinent positives,  otherwise complete 10 system ROS negative.  Physical Exam: Blood pressure 146/82, pulse 92, temperature 98 F (36.7 C), temperature source Oral, resp. rate 18, SpO2 93.00%. There is no height or weight on file to calculate BMI.   General Appearance:  Alert, cooperative, no distress, appears stated age, obese  Head:  Normocephalic, without obvious abnormality, atraumatic  ENT: Unremarkable  Neck: Supple, symmetrical, trachea midline, no adenopathy, thyroid: not enlarged, symmetric, no tenderness/mass/nodules  Lungs:   Clear to auscultation bilaterally, no w/r/r, respirations unlabored without use of accessory muscles.  Heart:  Regular rate and rhythm, 2/6 systolic murmur  Abdomen:   Soft, non-tender, non distended. Bowel sounds active all four quadrants,  no masses, no organomegaly.  Extremities: Extremities normal, atraumatic, no cyanosis or edema  Neurologic: Normal affect, no gross deficits.   Results for orders placed in visit on 12/07/11 (from the past 48 hour(s))  SPEP & IFE WITH QIG     Status: Abnormal   Collection Time   12/11/11  2:03  PM      Component Value Range Comment   IgG (Immunoglobin G), Serum 1460  690 - 1700 mg/dL    IgA 401 (*) 69 - 027 mg/dL    IgM, Serum 81  52 - 322 mg/dL    Immunofix Electr Int        Total Protein, serum electrophor 7.1  6.0 - 8.3 g/dL    Albumin ELP 25.3 (*) 55.8 - 66.1 %    Alpha-1-Globulin 4.4  2.9 - 4.9 %    Alpha-2-Globulin 9.9  7.1 - 11.8 %    Beta Globulin 5.4  4.7 - 7.2 %    Beta 2 6.9 (*) 3.2 - 6.5 %    Gamma Globulin 19.6 (*) 11.1 - 18.8 %    M-Spike, % NOT DET      SPE Interp.        COMMENT (PROTEIN ELECTROPHOR)        No results found.  Assessment/Plan Back pain secondary to L5-S1 discitis/osteomyelitis Labs pending For L5-S1 disc aspiration/possible bone biopsy. Discussed procedure as well as risks involved including sedation. Consent signed in chart.  Brayton El PA-C 12/12/2011, 9:03 AM

## 2011-12-12 NOTE — Progress Notes (Addendum)
Patient c/o nausea. PA called. Order received.

## 2011-12-12 NOTE — Progress Notes (Signed)
C/O NAUSEA AND VOMITED 300CC CLEAR LIQUIDS; DR DEVESHWAR NOTIFIED OF N&V AND ORDERS NOTED

## 2011-12-12 NOTE — Progress Notes (Signed)
STATES FEELS BETTER NOW STATES NO NAUSEA  AND STOMACH NOT HURTING NOW

## 2011-12-12 NOTE — Procedures (Signed)
S/P L5/S1 fluoro guided disc aspiration. 

## 2011-12-12 NOTE — Progress Notes (Signed)
CALLED CLIENT AT HOME AND GIVEN D/C INSTRUCTIONS AND VOICED UNDERSTANDING

## 2011-12-13 ENCOUNTER — Telehealth (HOSPITAL_COMMUNITY): Payer: Self-pay | Admitting: *Deleted

## 2011-12-13 ENCOUNTER — Ambulatory Visit: Payer: Medicare HMO | Admitting: Internal Medicine

## 2011-12-13 NOTE — Telephone Encounter (Signed)
Post procedure phone call attempted.  Left message to call for any problems or questions.

## 2011-12-15 ENCOUNTER — Ambulatory Visit (INDEPENDENT_AMBULATORY_CARE_PROVIDER_SITE_OTHER): Payer: Medicare HMO | Admitting: Internal Medicine

## 2011-12-15 ENCOUNTER — Encounter: Payer: Self-pay | Admitting: Internal Medicine

## 2011-12-15 VITALS — BP 155/88 | HR 60 | Temp 98.6°F | Resp 16 | Wt 210.0 lb

## 2011-12-15 DIAGNOSIS — M4647 Discitis, unspecified, lumbosacral region: Secondary | ICD-10-CM

## 2011-12-15 DIAGNOSIS — M545 Low back pain: Secondary | ICD-10-CM

## 2011-12-15 DIAGNOSIS — M519 Unspecified thoracic, thoracolumbar and lumbosacral intervertebral disc disorder: Secondary | ICD-10-CM

## 2011-12-15 LAB — WOUND CULTURE: Culture: NO GROWTH

## 2011-12-15 MED ORDER — OXYCODONE HCL 5 MG PO TABS: ORAL_TABLET | ORAL | Status: DC

## 2011-12-15 NOTE — Progress Notes (Signed)
Subjective:    Patient ID: Alyssa Ingram, female    DOB: Oct 02, 1945, 66 y.o.   MRN: [id number]  HPI  Here for NEW R side buttock pain Onset 3 days ago - day after CT bx for eval of ?L5-S1 discitis No fall or new injury Different than prior kidney stone pain and L side back pain earlier this month Completed pred and oxycodone last week - tx for radicular symptoms  Little relief with muscle relaxer robaxin  Past Medical History  Diagnosis Date  . Arthritis   . Asthma   . Cancer   . GERD (gastroesophageal reflux disease)   . Hypertension   . Kidney stones   . CHF (congestive heart failure)   . Hyperlipidemia   . Hypertrophic nonobstructive cardiomyopathy   . Abnormal MRI, spine 11/2011    ?discitis L5-S1, s/p CT bx 12/12/11     Review of Systems  Constitutional: Negative for fever and fatigue.  Respiratory: Negative for cough and shortness of breath.   Cardiovascular: Negative for chest pain and palpitations.  Gastrointestinal: Positive for constipation. Negative for abdominal pain and blood in stool.  Musculoskeletal: Positive for back pain.       Objective:   Physical Exam BP 155/88  Pulse 60  Temp 98.6 F (37 C) (Oral)  Resp 16  Wt 210 lb (95.255 kg)  SpO2 96% Constitutional: She is obese, but appears well-developed and well-nourished. No distress.  Neck: Normal range of motion. Neck supple. No JVD present. No thyromegaly present.  Cardiovascular: Normal rate, regular rhythm and normal heart sounds.  No murmur heard. No BLE edema. Pulmonary/Chest: Effort normal and breath sounds normal. No respiratory distress. She has no wheezes.  Abdominal: Soft. Bowel sounds are normal. She exhibits no distension. There is no tenderness. no masses Musculoskeletal: Back: full range of motion of thoracic and lumbar spine.  tender to palpation over L spine and R paraspinal region. Positive ipsilateral straight leg raise. DTR's are symmetrically intact. Sensation intact in all  dermatomes of the lower extremities. Full strength to manual muscle testing. patient is able to heel toe walk without difficulty and ambulates with antalgic gait.  Skin: Skin is warm and dry. No rash noted. No erythema.  Psychiatric: She has a normal mood and affect. Her behavior is normal. Judgment and thought content normal.   Lab Results  Component Value Date   WBC 5.7 12/12/2011   HGB 12.7 12/12/2011   HCT 38.9 12/12/2011   PLT 209 12/12/2011   GLUCOSE 109* 12/07/2011   CHOL 198 09/13/2011   TRIG 69.0 09/13/2011   HDL 60.60 09/13/2011   LDLCALC 124* 09/13/2011   ALT 12 12/07/2011   AST 15 12/07/2011   NA 139 12/07/2011   K 3.8 12/07/2011   CL 103 12/07/2011   CREATININE 0.9 12/07/2011   BUN 27* 12/07/2011   CO2 26 12/07/2011   TSH 0.61 09/13/2011   INR 1.03 12/12/2011   No results found for this basename: ESRSEDRATE, SEDRATE, POCTSEDRATE   No results found for this basename: CRP   Ir Fluoro Guide Ndl Plmt / Bx  12/13/2011  *RADIOLOGY REPORT*  Clinical Data: Patient with low back pain.  Abnormal MRI scan at L5- S1 suggestive of diskitis.  FLUOROSCOPIC-GUIDED NEEDLE PLACEMENT FOR DISK ASPIRATION AT L5-S1:  Comparison: MRI of the lumbosacral spine of 12/05/2011.  Following a full explanation of the procedure along with the potential associated complications, an informed witnessed consent was obtained.  The patient was placed prone on the fluoroscopic  table.  The skin overlying the lumbosacral region was then prepped and draped in the usual sterile fashion.  The skin entry site in the right paramedian region was then infiltrated with 0.25% bupivacaine, and extended as deep as possible.  Using biplane intermittent fluoroscopy, a 20-gauge Franseen biopsy needle was then advanced into the L5-S1 disc space into the junction of the posterior and middle one thirds to the right of midline.  Using a 20 ml syringe, approximately 1 IU of blood stained fluid was obtained and sent for microbiological analysis.  A second  pass also resulted in a similar amount of blood stained fluid obtained.  The needle was then removed.  Hemostasis was achieved at the skin entry site.  The patient tolerated the procedure well.  There were no acute complications.  Medications utilized: Versed 3 mg IV.  Fentanyl 75 mcg IV.  IMPRESSION: Status post fluoroscopic-guided needle placement at L5-S1 for disk aspiration for diskitis.  Original Report Authenticated By: Oneal Grout, M.D.      Assessment & Plan:  New R buttock pain - ?referred from ongoing problems -  Pt feels this new pain related to "usual arthritis" ?Discitis L5-S1 - interval hx and MRI reviewed  Check ESR and CRP - but pt declines labs today prelim bx results appear not infx but will hold on additional pred Resume oxycodone prn and continue robaxin

## 2011-12-15 NOTE — Patient Instructions (Signed)
It was good to see you today. We have reviewed your prior records including labs and tests today Consider having additional blood tests as discussed Refill on your pain pills as requested Continue to work with Dr Jonny Ruiz and your other specialists as ongoing, call if pain worse or unimproved

## 2011-12-19 ENCOUNTER — Ambulatory Visit (HOSPITAL_COMMUNITY): Payer: Medicare HMO | Attending: Cardiology

## 2011-12-19 DIAGNOSIS — I509 Heart failure, unspecified: Secondary | ICD-10-CM | POA: Insufficient documentation

## 2011-12-19 DIAGNOSIS — I517 Cardiomegaly: Secondary | ICD-10-CM | POA: Insufficient documentation

## 2011-12-19 DIAGNOSIS — I10 Essential (primary) hypertension: Secondary | ICD-10-CM | POA: Insufficient documentation

## 2011-12-19 DIAGNOSIS — I421 Obstructive hypertrophic cardiomyopathy: Secondary | ICD-10-CM | POA: Insufficient documentation

## 2011-12-19 DIAGNOSIS — I059 Rheumatic mitral valve disease, unspecified: Secondary | ICD-10-CM | POA: Insufficient documentation

## 2011-12-19 NOTE — Progress Notes (Signed)
Echocardiogram performed.  

## 2011-12-20 ENCOUNTER — Other Ambulatory Visit: Payer: Self-pay | Admitting: *Deleted

## 2011-12-20 DIAGNOSIS — I421 Obstructive hypertrophic cardiomyopathy: Secondary | ICD-10-CM

## 2011-12-20 NOTE — Progress Notes (Signed)
Spoke with pt, aware of echo results. Orders placed for further testing per dr Jens Som

## 2011-12-26 ENCOUNTER — Ambulatory Visit: Payer: Medicare HMO | Admitting: Internal Medicine

## 2011-12-27 ENCOUNTER — Telehealth: Payer: Self-pay | Admitting: *Deleted

## 2011-12-27 NOTE — Telephone Encounter (Signed)
Called patient to try and reschedule her missed appt and she advised that she was not sure she needed to come here, she has to many doctors to keep up with. Advised she will call her referring provider and check and give [place] a call back. Gave her the number and advised her to call if she finds she still needs the appt.

## 2012-01-10 ENCOUNTER — Encounter: Payer: Medicare HMO | Admitting: Physician Assistant

## 2012-01-23 ENCOUNTER — Ambulatory Visit (INDEPENDENT_AMBULATORY_CARE_PROVIDER_SITE_OTHER): Payer: Medicare HMO | Admitting: Physician Assistant

## 2012-01-23 ENCOUNTER — Encounter: Payer: Self-pay | Admitting: Physician Assistant

## 2012-01-23 ENCOUNTER — Encounter (INDEPENDENT_AMBULATORY_CARE_PROVIDER_SITE_OTHER): Payer: Medicare HMO

## 2012-01-23 VITALS — BP 160/78 | HR 48 | Ht 60.0 in | Wt 212.6 lb

## 2012-01-23 DIAGNOSIS — I421 Obstructive hypertrophic cardiomyopathy: Secondary | ICD-10-CM

## 2012-01-23 DIAGNOSIS — I5033 Acute on chronic diastolic (congestive) heart failure: Secondary | ICD-10-CM

## 2012-01-23 MED ORDER — FUROSEMIDE 20 MG PO TABS: ORAL_TABLET | ORAL | Status: DC

## 2012-01-23 NOTE — Patient Instructions (Addendum)
Your physician has requested that you have an exercise tolerance test DX HEART FAILURE, HOCM. For further information please visit [email or web address] Please also follow instruction sheet, as given.  Your physician recommends that you return for lab work in: 1 WEEK FOR BMET  Your physician has recommended you make the following change in your medication: INCREASE LASIX TO 80 MG TWICE DAILY FOR 3 DAYS THEN RESUME PREVIOUS DOSE FROM BEFORE; PLEASE CALL [phone] [facility], PAC IF YOUR SWELLING IS NOT BETTER IN 3 DAYS WITH THE INCREASED DOSE OF LASIX.  KEEP LEGS ELEVATED   DECREASE SALT INTAKE

## 2012-01-23 NOTE — Progress Notes (Signed)
[address] [place], [place]  [address] Phone: [phone] Fax:  [phone]  Date:  01/23/2012   Name:  Alyssa Ingram   DOB:  Jul 31, 1945   MRN:  [id number]  PCP:  Oliver Barre, MD  Primary Cardiologist:  Dr. Olga Millers  Primary Electrophysiologist:  None    History of Present Illness: Alyssa Ingram is a 66 y.o. female who presents to the office today for an exercise treadmill test. She saw Dr. Jens Som in July. She has a history of CHF with preserved LV function. Nuclear study in 2011 was negative for ischemia. She has had asymmetric septal hypertrophy on echocardiogram. She is on chronic furosemide therapy at 60 mg twice a day. When she last saw Dr. Jens Som, an echocardiogram was scheduled. Echo 12/19/11: Severe LVH, EF 55-65%, dynamic obstruction, wall motion normal, grade 1 diastolic dysfunction, systolic anterior motion of the mitral valve with mild MS and mild MR, mild LAE, PASP 34. Patient presents today with increasing lower extremity edema. This is limiting her mobility quite a bit. She uses a cane to walk.  She does not feel that she can walk on the treadmill. She denies chest pain. Her dyspnea is fairly stable. She denies orthopnea or PND. She denies syncope. She does admit to some increased salt in her diet recently.   Past Medical History  Diagnosis Date  . Arthritis   . Asthma   . Cancer   . GERD (gastroesophageal reflux disease)   . Hypertension   . Kidney stones   . Chronic diastolic heart failure   . Hyperlipidemia   . HOCM (hypertrophic obstructive cardiomyopathy)     Echo 12/19/11: Severe LVH, EF 55-65%, dynamic obstruction, wall motion normal, grade 1 diastolic dysfunction, systolic anterior motion of the mitral valve with mild MS and mild MR, mild LAE, PASP 34  . Abnormal MRI, spine 11/2011    ?discitis L5-S1, s/p CT bx 12/12/11  . H/O cardiovascular stress test     Nuclear study in 2011 normal perfusion    Current Outpatient  Prescriptions  Medication Sig Dispense Refill  . albuterol (VENTOLIN HFA) 108 (90 BASE) MCG/ACT inhaler Inhale 2 puffs into the lungs 2 (two) times daily.  3 Inhaler  3  . aspirin 81 MG tablet Take 81 mg by mouth. Take 2 pills daily      . esomeprazole (NEXIUM) 40 MG capsule Take 1 capsule (40 mg total) by mouth daily.  90 capsule  3  . furosemide (LASIX) 20 MG tablet Take 1 tablet (20 mg total) by mouth 2 (two) times daily.  60 tablet  12  . furosemide (LASIX) 40 MG tablet Take 40 mg by mouth 2 (two) times daily.      Marland Kitchen lovastatin (MEVACOR) 20 MG tablet Take 40 mg by mouth at bedtime.      . meclizine (ANTIVERT) 25 MG tablet Take by mouth. Take 2 tablets tid prn      . metoprolol (LOPRESSOR) 50 MG tablet Take 1 tablet (50 mg total) by mouth 2 (two) times daily.  180 tablet  3  . oxyCODONE (OXY IR/ROXICODONE) 5 MG immediate release tablet 1-2 tabs by mouth every 4-6 hours, with maximum 8 tabs per day  60 tablet  0  . valsartan (DIOVAN) 320 MG tablet Take 320 mg by mouth daily.        Allergies: No Known Allergies  History  Substance Use Topics  . Smoking status: Never Smoker   . Smokeless tobacco: Never  Used  . Alcohol Use: No     ROS:  Please see the history of present illness.   No fevers, chills. She does have a nonproductive cough.  All other systems reviewed and negative.   PHYSICAL EXAM: VS:  There were no vitals taken for this visit. Well nourished, well developed, in no acute distress HEENT: normal Neck: no JVD at 90 Cardiac:  normal S1, S2; RRR Lungs:  clear to auscultation bilaterally, no wheezing, rhonchi or rales Abd: soft, nontender, no hepatomegaly Ext: 1-2+ bilateral LE edema Skin: warm and dry Neuro:  CNs 2-12 intact, no focal abnormalities noted   ASSESSMENT AND PLAN:  1. Acute on Chronic Diastolic CHF: She has increasing lower extremity edema. She does not feel that she can walk on the treadmill safely today. Her stress test was arranged for risk  stratification in the setting of HOCM. She will have her Lasix increased to 80 mg twice a day for 3 days. We will check a basic metabolic panel in one week. She will contact us if her edema is not improving. We discussed limiting her salt and keeping her legs elevated.  2. Hypertrophic Obstructive Cardiomyopathy: Holter monitor was placed today. As noted, her ETT will be rescheduled for risk stratification.  Luna Glasgow, PA-C  11:38 AM 01/23/2012

## 2012-01-24 LAB — AFB CULTURE WITH SMEAR (NOT AT ARMC)

## 2012-01-26 ENCOUNTER — Telehealth: Payer: Self-pay | Admitting: *Deleted

## 2012-01-26 NOTE — Telephone Encounter (Signed)
Spoke with pt, aware monitor shows sinus with PAC's and PVC's

## 2012-02-05 ENCOUNTER — Encounter: Payer: Medicare HMO | Admitting: Physician Assistant

## 2012-02-05 ENCOUNTER — Other Ambulatory Visit: Payer: Medicare HMO

## 2012-02-21 ENCOUNTER — Other Ambulatory Visit: Payer: Medicare HMO

## 2012-02-21 ENCOUNTER — Encounter: Payer: Medicare HMO | Admitting: Physician Assistant

## 2012-02-26 ENCOUNTER — Ambulatory Visit: Payer: Medicare HMO | Admitting: Cardiology

## 2012-03-07 ENCOUNTER — Other Ambulatory Visit: Payer: Medicare HMO

## 2012-03-07 ENCOUNTER — Encounter: Payer: Medicare HMO | Admitting: Physician Assistant

## 2012-03-15 ENCOUNTER — Ambulatory Visit: Payer: Medicare Other | Admitting: Internal Medicine

## 2012-03-15 DIAGNOSIS — Z0289 Encounter for other administrative examinations: Secondary | ICD-10-CM

## 2012-04-16 ENCOUNTER — Encounter: Payer: Self-pay | Admitting: Internal Medicine

## 2012-04-16 ENCOUNTER — Ambulatory Visit: Payer: Medicare HMO | Admitting: Internal Medicine

## 2012-04-16 ENCOUNTER — Ambulatory Visit (INDEPENDENT_AMBULATORY_CARE_PROVIDER_SITE_OTHER): Payer: Medicare HMO | Admitting: Internal Medicine

## 2012-04-16 VITALS — BP 138/84 | HR 66 | Temp 97.0°F | Ht 60.0 in | Wt 232.0 lb

## 2012-04-16 DIAGNOSIS — L0390 Cellulitis, unspecified: Secondary | ICD-10-CM

## 2012-04-16 DIAGNOSIS — M5380 Other specified dorsopathies, site unspecified: Secondary | ICD-10-CM

## 2012-04-16 DIAGNOSIS — M62830 Muscle spasm of back: Secondary | ICD-10-CM

## 2012-04-16 DIAGNOSIS — M25519 Pain in unspecified shoulder: Secondary | ICD-10-CM

## 2012-04-16 MED ORDER — CEPHALEXIN 500 MG PO CAPS: 500.0000 mg | ORAL_CAPSULE | Freq: Two times a day (BID) | ORAL | Status: DC

## 2012-04-16 MED ORDER — DICLOFENAC SODIUM 75 MG PO TBEC: 75.0000 mg | DELAYED_RELEASE_TABLET | Freq: Two times a day (BID) | ORAL | Status: DC

## 2012-04-16 MED ORDER — CYCLOBENZAPRINE HCL 10 MG PO TABS: 10.0000 mg | ORAL_TABLET | Freq: Three times a day (TID) | ORAL | Status: DC | PRN

## 2012-04-16 NOTE — Patient Instructions (Addendum)
Shoulder Exercises  EXERCISES   RANGE OF MOTION (ROM) AND STRETCHING EXERCISES  These exercises may help you when beginning to rehabilitate your injury. Your symptoms may resolve with or without further involvement from your physician, physical therapist or athletic trainer. While completing these exercises, remember:   · Restoring tissue flexibility helps normal motion to return to the joints. This allows healthier, less painful movement and activity.  · An effective stretch should be held for at least 30 seconds.  · A stretch should never be painful. You should only feel a gentle lengthening or release in the stretched tissue.  ROM - Pendulum  · Bend at the waist so that your right / left arm falls away from your body. Support yourself with your opposite hand on a solid surface, such as a table or a countertop.  · Your right / left arm should be perpendicular to the ground. If it is not perpendicular, you need to lean over farther. Relax the muscles in your right / left arm and shoulder as much as possible.  · Gently sway your hips and trunk so they move your right / left arm without any use of your right / left shoulder muscles.  · Progress your movements so that your right / left arm moves side to side, then forward and backward, and finally, both clockwise and counterclockwise.  · Complete __________ repetitions in each direction. Many people use this exercise to relieve discomfort in their shoulder as well as to gain range of motion.  Repeat __________ times. Complete this exercise __________ times per day.  STRETCH - Flexion, Standing  · Stand with good posture. With an underhand grip on your right / left hand and an overhand grip on the opposite hand, grasp a broomstick or cane so that your hands are a little more than shoulder-width apart.  · Keeping your right / left elbow straight and shoulder muscles relaxed, push the stick with your opposite hand to raise your right / left arm in front of your body and  then overhead. Raise your arm until you feel a stretch in your right / left shoulder, but before you have increased shoulder pain.  · Try to avoid shrugging your right / left shoulder as your arm rises by keeping your shoulder blade tucked down and toward your mid-back spine. Hold __________ seconds.  · Slowly return to the starting position.  Repeat __________ times. Complete this exercise __________ times per day.  STRETCH - Internal Rotation  · Place your right / left hand behind your back, palm-up.  · Throw a towel or belt over your opposite shoulder. Grasp the towel/belt with your right / left hand.  · While keeping an upright posture, gently pull up on the towel/belt until you feel a stretch in the front of your right / left shoulder.  · Avoid shrugging your right / left shoulder as your arm rises by keeping your shoulder blade tucked down and toward your mid-back spine.  · Hold __________. Release the stretch by lowering your opposite hand.  Repeat __________ times. Complete this exercise __________ times per day.  STRETCH - External Rotation and Abduction  · Stagger your stance through a doorframe. It does not matter which foot is forward.  · As instructed by your physician, physical therapist or athletic trainer, place your hands:  · And forearms above your head and on the door frame.  · And forearms at head-height and on the door frame.  · At elbow-height and   on the door frame.  · Keeping your head and chest upright and your stomach muscles tight to prevent over-extending your low-back, slowly shift your weight onto your front foot until you feel a stretch across your chest and/or in the front of your shoulders.  · Hold __________ seconds. Shift your weight to your back foot to release the stretch.  Repeat __________ times. Complete this stretch __________ times per day.   STRENGTHENING EXERCISES   These exercises may help you when beginning to rehabilitate your injury. They may resolve your symptoms with  or without further involvement from your physician, physical therapist or athletic trainer. While completing these exercises, remember:   · Muscles can gain both the endurance and the strength needed for everyday activities through controlled exercises.  · Complete these exercises as instructed by your physician, physical therapist or athletic trainer. Progress the resistance and repetitions only as guided.  · You may experience muscle soreness or fatigue, but the pain or discomfort you are trying to eliminate should never worsen during these exercises. If this pain does worsen, stop and make certain you are following the directions exactly. If the pain is still present after adjustments, discontinue the exercise until you can discuss the trouble with your clinician.  · If advised by your physician, during your recovery, avoid activity or exercises which involve actions that place your right / left hand or elbow above your head or behind your back or head. These positions stress the tissues which are trying to heal.  STRENGTH - Scapular Depression and Adduction  · With good posture, sit on a firm chair. Supported your arms in front of you with pillows, arm rests or a table top. Have your elbows in line with the sides of your body.  · Gently draw your shoulder blades down and toward your mid-back spine. Gradually increase the tension without tensing the muscles along the top of your shoulders and the back of your neck.  · Hold for __________ seconds. Slowly release the tension and relax your muscles completely before completing the next repetition.  · After you have practiced this exercise, remove the arm support and complete it in standing as well as sitting.  Repeat __________ times. Complete this exercise __________ times per day.   STRENGTH - External Rotators  · Secure a rubber exercise band/tubing to a fixed object so that it is at the same height as your right / left elbow when you are standing or sitting on a  firm surface.  · Stand or sit so that the secured exercise band/tubing is at your side that is not injured.  · Bend your elbow 90 degrees. Place a folded towel or small pillow under your right / left arm so that your elbow is a few inches away from your side.  · Keeping the tension on the exercise band/tubing, pull it away from your body, as if pivoting on your elbow. Be sure to keep your body steady so that the movement is only coming from your shoulder rotating.  · Hold __________ seconds. Release the tension in a controlled manner as you return to the starting position.  Repeat __________ times. Complete this exercise __________ times per day.   STRENGTH - Supraspinatus  · Stand or sit with good posture. Grasp a __________ weight or an exercise band/tubing so that your hand is "thumbs-up," like when you shake hands.  · Slowly lift your right / left hand from your thigh into the air, traveling about 30   degrees from straight out at your side. Lift your hand to shoulder height or as far as you can without increasing any shoulder pain. Initially, many people do not lift their hands above shoulder height.  · Avoid shrugging your right / left shoulder as your arm rises by keeping your shoulder blade tucked down and toward your mid-back spine.  · Hold for __________ seconds. Control the descent of your hand as you slowly return to your starting position.  Repeat __________ times. Complete this exercise __________ times per day.   STRENGTH - Shoulder Extensors  · Secure a rubber exercise band/tubing so that it is at the height of your shoulders when you are either standing or sitting on a firm arm-less chair.  · With a thumbs-up grip, grasp an end of the band/tubing in each hand. Straighten your elbows and lift your hands straight in front of you at shoulder height. Step back away from the secured end of band/tubing until it becomes tense.  · Squeezing your shoulder blades together, pull your hands down to the sides of  your thighs. Do not allow your hands to go behind you.  · Hold for __________ seconds. Slowly ease the tension on the band/tubing as you reverse the directions and return to the starting position.  Repeat __________ times. Complete this exercise __________ times per day.   STRENGTH - Scapular Retractors  · Secure a rubber exercise band/tubing so that it is at the height of your shoulders when you are either standing or sitting on a firm arm-less chair.  · With a palm-down grip, grasp an end of the band/tubing in each hand. Straighten your elbows and lift your hands straight in front of you at shoulder height. Step back away from the secured end of band/tubing until it becomes tense.  · Squeezing your shoulder blades together, draw your elbows back as you bend them. Keep your upper arm lifted away from your body throughout the exercise.  · Hold __________ seconds. Slowly ease the tension on the band/tubing as you reverse the directions and return to the starting position.  Repeat __________ times. Complete this exercise __________ times per day.  STRENGTH - Scapular Depressors  · Find a sturdy chair without wheels, such as a from a dining room table.  · Keeping your feet on the floor, lift your bottom from the seat and lock your elbows.  · Keeping your elbows straight, allow gravity to pull your body weight down. Your shoulders will rise toward your ears.  · Raise your body against gravity by drawing your shoulder blades down your back, shortening the distance between your shoulders and ears. Although your feet should always maintain contact with the floor, your feet should progressively support less body weight as you get stronger.  · Hold __________ seconds. In a controlled and slow manner, lower your body weight to begin the next repetition.  Repeat __________ times. Complete this exercise __________ times per day.   Document Released: 03/15/2005 Document Revised: 07/24/2011 Document Reviewed:  08/13/2008  [employer]® Patient Information ©2013 [employer].

## 2012-04-16 NOTE — Progress Notes (Signed)
HPI  Pt presents to the clinic today with c/o muscle spasms in her back. This is an ongoing problem for her. She has taken a muscle relaxer in the past but has ran out of her medication. Addtionally, she c/o right shoulder pain. This is new. It hurts worse when she tries to bend her arm up or lit it above her head. She also c/o redness of both of her lower extremeties. This is an ongoing problem an she if frequently treated for cellulitis of the lower extrememtis.  Past Medical History  Diagnosis Date  . Arthritis   . Asthma   . Cancer   . GERD (gastroesophageal reflux disease)   . Hypertension   . Kidney stones   . Chronic diastolic heart failure   . Hyperlipidemia   . HOCM (hypertrophic obstructive cardiomyopathy)     Echo 12/19/11: Severe LVH, EF 55-65%, dynamic obstruction, wall motion normal, grade 1 diastolic dysfunction, systolic anterior motion of the mitral valve with mild MS and mild MR, mild LAE, PASP 34  . Abnormal MRI, spine 11/2011    ?discitis L5-S1, s/p CT bx 12/12/11  . H/O cardiovascular stress test     Nuclear study in 2011 normal perfusion    Current Outpatient Prescriptions  Medication Sig Dispense Refill  . albuterol (VENTOLIN HFA) 108 (90 BASE) MCG/ACT inhaler Inhale 2 puffs into the lungs 2 (two) times daily.  3 Inhaler  3  . aspirin 81 MG tablet Take 81 mg by mouth. Take 2 pills daily      . esomeprazole (NEXIUM) 40 MG capsule Take 1 capsule (40 mg total) by mouth daily.  90 capsule  3  . furosemide (LASIX) 20 MG tablet TAKE 80 MG TWICE DAILY FOR 3 DAYS THE RESUME PREVIOUS DOSE  90 tablet  12  . furosemide (LASIX) 40 MG tablet Take 40 mg by mouth 2 (two) times daily.      Marland Kitchen lovastatin (MEVACOR) 20 MG tablet Take 40 mg by mouth at bedtime.      . meclizine (ANTIVERT) 25 MG tablet Take by mouth. Take 2 tablets tid prn      . valsartan (DIOVAN) 320 MG tablet Take 320 mg by mouth daily.      . cephALEXin (KEFLEX) 500 MG capsule Take 1 capsule (500 mg total) by mouth 2  (two) times daily.  20 capsule  0  . cyclobenzaprine (FLEXERIL) 10 MG tablet Take 1 tablet (10 mg total) by mouth 3 (three) times daily as needed for muscle spasms.  30 tablet  1  . diclofenac (VOLTAREN) 75 MG EC tablet Take 1 tablet (75 mg total) by mouth 2 (two) times daily.  30 tablet  1  . metoprolol (LOPRESSOR) 50 MG tablet Take 1 tablet (50 mg total) by mouth 2 (two) times daily.  180 tablet  3    No Known Allergies  Family History  Problem Relation Age of Onset  . Arthritis Other   . Heart disease Other   . Hypertension Other   . Diabetes Other   . Alcohol abuse Other   . Arthritis Other   . Cancer Other     Lung Cancer  . Heart disease Other   . Hypertension Other   . Sudden death Other   . Kidney disease Other   . Mental illness Other   . Diabetes Other   . Heart disease Father     History   Social History  . Marital Status: Married    Spouse  Name: N/A    Number of Children: 5  . Years of Education: 14   Occupational History  . Retired Airline pilot    Social History Main Topics  . Smoking status: Never Smoker   . Smokeless tobacco: Never Used  . Alcohol Use: No  . Drug Use: No  . Sexually Active: Not on file   Other Topics Concern  . Not on file   Social History Narrative  . No narrative on file     Constitutional: Denies fever, malaise, fatigue, headache or abrupt weight changes.  Respiratory: Denies difficulty breathing, shortness of breath, cough or sputum production.   Cardiovascular: Pt reports bilateral feet and ankle swelling. Denies chest pain, chest tightness, palpitations or swelling in the hands.  Musculoskeletal: Pt reports right shoulder pain and muscle spasm in her back. Denies decrease in range of motion, difficulty with gait,  or joint pain and swelling.  Skin: Denies redness, rashes, lesions or ulcercations.  Neurological: Denies dizziness, difficulty with memory, difficulty with speech or problems with balance and coordination.   No  other specific complaints in a complete review of systems (except as listed in HPI above).  BP 138/84  Pulse 66  Temp 97 F (36.1 C) (Oral)  Ht 5' (1.524 m)  Wt 232 lb (105.235 kg)  BMI 45.31 kg/m2  SpO2 95% Wt Readings from Last 3 Encounters:  04/16/12 232 lb (105.235 kg)  01/23/12 212 lb 9.6 oz (96.435 kg)  12/15/11 210 lb (95.255 kg)    General: Appears her stated age, obese butwell developed, well nourished in NAD. Skin: Cellulitis noted on bilateral lower extremeties. Warm to touch but skin intact.   present.  Cardiovascular: Normal rate and rhythm. S1,S2 noted.  No murmur, rubs or gallops noted. No JVD or BLE edema. No carotid bruits noted. Pulmonary/Chest: Normal effort and positive vesicular breath sounds. No respiratory distress. No wheezes, rales or ronchi noted.  Musculoskeletal: Normal range of motion. No signs of joint swelling. No difficulty with gait.  Neurological: Alert and oriented. Cranial nerves II-XII intact. Coordination normal. +DTRs bilaterally. Psychiatric: Mood and affect normal. Behavior is normal. Judgment and thought content normal.        Assessment & Plan:  Muscle spasms:  eRx given for flexeril.  Stretching exercises given per patient handout.  Shoulder pain, likely due to bursitis:  eRx given for doclofenac Stretching exercises given per patient handout  Cellulitis bilateral lower extremeties:  eRx given for Keflex x 10 days  Follow up with Dr. Jonny Ruiz at your next scheduled visit

## 2012-04-29 ENCOUNTER — Telehealth: Payer: Self-pay | Admitting: Cardiology

## 2012-04-29 NOTE — Telephone Encounter (Signed)
New Problem:    Patient's daughter called in wanting to have her seen sometime this week because she had a stroke in her left eye.  Please call back.

## 2012-04-29 NOTE — Telephone Encounter (Signed)
appt made

## 2012-05-03 ENCOUNTER — Encounter: Payer: Self-pay | Admitting: Cardiology

## 2012-05-03 ENCOUNTER — Ambulatory Visit (INDEPENDENT_AMBULATORY_CARE_PROVIDER_SITE_OTHER): Payer: Medicare HMO | Admitting: Cardiology

## 2012-05-03 VITALS — BP 154/79 | HR 93 | Wt 232.0 lb

## 2012-05-03 DIAGNOSIS — I6350 Cerebral infarction due to unspecified occlusion or stenosis of unspecified cerebral artery: Secondary | ICD-10-CM

## 2012-05-03 DIAGNOSIS — I10 Essential (primary) hypertension: Secondary | ICD-10-CM

## 2012-05-03 DIAGNOSIS — I639 Cerebral infarction, unspecified: Secondary | ICD-10-CM | POA: Insufficient documentation

## 2012-05-03 DIAGNOSIS — I421 Obstructive hypertrophic cardiomyopathy: Secondary | ICD-10-CM

## 2012-05-03 LAB — SEDIMENTATION RATE: Sed Rate: 34 mm/hr — ABNORMAL HIGH (ref 0–22)

## 2012-05-03 LAB — C-REACTIVE PROTEIN: CRP: <0.5 mg/dL (ref 0.5–20.0)

## 2012-05-03 NOTE — Assessment & Plan Note (Addendum)
Previous monitor showed no nonsustained ventricular tachycardia. No history of syncope. No family history of some death. Schedule exercise treadmill to evaluate systolic pressure with exercise. Continue beta blocker. Patient instructed that she must have her children screened. She appears to be euvolemic on examination.continue present dose of Lasix.

## 2012-05-03 NOTE — Patient Instructions (Addendum)
Your physician wants you to follow-up in: 6 MONTHS WITH DR Jens Som You will receive a reminder letter in the mail two months in advance. If you don't receive a letter, please call our office to schedule the follow-up appointment.   Your physician has requested that you have an echocardiogram. Echocardiography is a painless test that uses sound waves to create images of your heart. It provides your doctor with information about the size and shape of your heart and how well your heart's chambers and valves are working. This procedure takes approximately one hour. There are no restrictions for this procedure.   Your physician has requested that you have a carotid duplex. This test is an ultrasound of the carotid arteries in your neck. It looks at blood flow through these arteries that supply the brain with blood. Allow one hour for this exam. There are no restrictions or special instructions.   Your physician recommends that you HAVE LAB WORK TODAY  Your physician has requested that you have an exercise tolerance test. For further information please visit [email or web address] Please also follow instruction sheet, as given.

## 2012-05-03 NOTE — Assessment & Plan Note (Signed)
Patient's ophthalmologist is concerned that she may have had an embolic event to her left eye. However it may also be a retinal problem. They have asked to rule out source of embolus. Plan repeat echocardiogram and check carotid Dopplers. They have also asked for sedimentation rate and CRP.

## 2012-05-03 NOTE — Assessment & Plan Note (Signed)
Continue present blood pressure medications. 

## 2012-05-03 NOTE — Progress Notes (Signed)
HPI: Pleasant female for fu of HCM. Laboratories May 1 of 2013 revealed normal renal function, potassium 4.2, normal liver functions, hemoglobin 11.8. TSH normal at 0.61. Chest x-ray showed cardiac enlargement and pulmonary vascular congestion. Patient previously lived in [place]. Records from there include: Nuclear study in November of 2011 showed an ejection fraction of 62% and normal perfusion. She carries a diagnosis of hypertrophic nonobstructive cardiomyopathy. Repeat echocardiogram in August of 2013 showed normal LV function, severe left ventricular hypertrophy and grade 1 diastolic dysfunction. There was systolic anterior motion with mild mitral regurgitation. Mild left atrial enlargement. There was dynamic obstruction. Holter monitor in September 2013 showed sinus rhythm with PAC and PVC. Exercise treadmill for risk stratification was arranged but patient canceled because of lower extremity edema. Since I last saw her in July of 2013, she has some dyspnea on exertion but no orthopnea or PND. She has chronic pedal edema. No chest pain or syncope. 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 There was concern this may be a CVA when she saw her ophthalmologist. However it may also be a retinal problem.  Current Outpatient Prescriptions  Medication Sig Dispense Refill  . albuterol (VENTOLIN HFA) 108 (90 BASE) MCG/ACT inhaler Inhale 2 puffs into the lungs 2 (two) times daily.  3 Inhaler  3  . aspirin 81 MG tablet Take 81 mg by mouth. Take 2 pills daily      . cyclobenzaprine (FLEXERIL) 10 MG tablet Take 1 tablet (10 mg total) by mouth 3 (three) times daily as needed for muscle spasms.  30 tablet  1  . furosemide (LASIX) 40 MG tablet Take 40 mg by mouth 2 (two) times daily.      Marland Kitchen lovastatin (MEVACOR) 20 MG tablet Take 40 mg by mouth at bedtime.      . meclizine (ANTIVERT) 25 MG tablet Take by mouth. Take 2 tablets tid prn      . metoprolol (LOPRESSOR) 50 MG tablet Take 1 tablet  (50 mg total) by mouth 2 (two) times daily.  180 tablet  3  . valsartan (DIOVAN) 320 MG tablet Take 320 mg by mouth daily.         Past Medical History  Diagnosis Date  . Arthritis   . Asthma   . Cancer   . GERD (gastroesophageal reflux disease)   . Hypertension   . Kidney stones   . Chronic diastolic heart failure   . Hyperlipidemia   . HOCM (hypertrophic obstructive cardiomyopathy)     Echo 12/19/11: Severe LVH, EF 55-65%, dynamic obstruction, wall motion normal, grade 1 diastolic dysfunction, systolic anterior motion of the mitral valve with mild MS and mild MR, mild LAE, PASP 34  . Abnormal MRI, spine 11/2011    ?discitis L5-S1, s/p CT bx 12/12/11  . H/O cardiovascular stress test     Nuclear study in 2011 normal perfusion    Past Surgical History  Procedure Date  . Abdominal hysterectomy   . Rotater cuff     right, 2005  . Joint replacement     right knee 2006  . Nephrolithiasis     History   Social History  . Marital Status: Married    Spouse Name: N/A    Number of Children: 5  . Years of Education: 14   Occupational History  . Retired Airline pilot    Social History Main Topics  . Smoking status: Never Smoker   . Smokeless tobacco: Never Used  . Alcohol Use:  No  . Drug Use: No  . Sexually Active: Not on file   Other Topics Concern  . Not on file   Social History Narrative  . No narrative on file    ROS: no fevers or chills, productive cough, hemoptysis, dysphasia, odynophagia, melena, hematochezia, dysuria, hematuria, rash, seizure activity, orthopnea, PND, pedal edema, claudication. Remaining systems are negative.  Physical Exam: Well-developed well-nourished in no acute distress.  Skin is warm and dry.  HEENT is normal.  Neck is supple.  Chest is clear to auscultation with normal expansion.  Cardiovascular exam is regular rate and rhythm. 2/6 systolic murmur left sternal border. Increases with Valsalva. Abdominal exam nontender or distended. No  masses palpated. Extremities show 1+ edema. neuro grossly intact  ECG sinus rhythm at a rate of 93, left anterior fascicular block, no ST changes.

## 2012-05-14 ENCOUNTER — Encounter (HOSPITAL_COMMUNITY): Payer: Medicare HMO

## 2012-05-17 ENCOUNTER — Other Ambulatory Visit (HOSPITAL_COMMUNITY): Payer: Medicare HMO

## 2012-05-20 ENCOUNTER — Ambulatory Visit (HOSPITAL_COMMUNITY): Payer: Medicare Other | Attending: Cardiology | Admitting: Radiology

## 2012-05-20 DIAGNOSIS — I059 Rheumatic mitral valve disease, unspecified: Secondary | ICD-10-CM | POA: Insufficient documentation

## 2012-05-20 DIAGNOSIS — I369 Nonrheumatic tricuspid valve disorder, unspecified: Secondary | ICD-10-CM | POA: Insufficient documentation

## 2012-05-20 DIAGNOSIS — I509 Heart failure, unspecified: Secondary | ICD-10-CM | POA: Insufficient documentation

## 2012-05-20 DIAGNOSIS — R609 Edema, unspecified: Secondary | ICD-10-CM | POA: Insufficient documentation

## 2012-05-20 DIAGNOSIS — I639 Cerebral infarction, unspecified: Secondary | ICD-10-CM

## 2012-05-20 DIAGNOSIS — I10 Essential (primary) hypertension: Secondary | ICD-10-CM | POA: Insufficient documentation

## 2012-05-20 NOTE — Progress Notes (Signed)
Echocardiogram performed.  

## 2012-05-24 ENCOUNTER — Encounter (INDEPENDENT_AMBULATORY_CARE_PROVIDER_SITE_OTHER): Payer: Medicare Other

## 2012-05-24 DIAGNOSIS — I6350 Cerebral infarction due to unspecified occlusion or stenosis of unspecified cerebral artery: Secondary | ICD-10-CM

## 2012-05-24 DIAGNOSIS — I639 Cerebral infarction, unspecified: Secondary | ICD-10-CM

## 2012-05-29 ENCOUNTER — Encounter: Payer: Medicare HMO | Admitting: Physician Assistant

## 2012-06-11 ENCOUNTER — Ambulatory Visit: Payer: Medicare Other | Admitting: Internal Medicine

## 2012-06-11 DIAGNOSIS — Z0289 Encounter for other administrative examinations: Secondary | ICD-10-CM

## 2012-06-14 ENCOUNTER — Ambulatory Visit (INDEPENDENT_AMBULATORY_CARE_PROVIDER_SITE_OTHER)
Admission: RE | Admit: 2012-06-14 | Discharge: 2012-06-14 | Disposition: A | Discharge status: Home / Self Care | Payer: Medicare Other | Source: Ambulatory Visit | Attending: Internal Medicine | Admitting: Internal Medicine

## 2012-06-14 ENCOUNTER — Encounter: Payer: Self-pay | Admitting: Internal Medicine

## 2012-06-14 ENCOUNTER — Ambulatory Visit (INDEPENDENT_AMBULATORY_CARE_PROVIDER_SITE_OTHER): Payer: Medicare Other | Admitting: Internal Medicine

## 2012-06-14 ENCOUNTER — Other Ambulatory Visit (INDEPENDENT_AMBULATORY_CARE_PROVIDER_SITE_OTHER): Payer: Medicare Other

## 2012-06-14 VITALS — BP 112/68 | HR 66 | Temp 97.1°F | Ht 60.0 in | Wt 228.0 lb

## 2012-06-14 DIAGNOSIS — M25519 Pain in unspecified shoulder: Secondary | ICD-10-CM

## 2012-06-14 DIAGNOSIS — M25511 Pain in right shoulder: Secondary | ICD-10-CM

## 2012-06-14 DIAGNOSIS — R7309 Other abnormal glucose: Secondary | ICD-10-CM

## 2012-06-14 DIAGNOSIS — R739 Hyperglycemia, unspecified: Secondary | ICD-10-CM

## 2012-06-14 MED ORDER — PREDNISONE 20 MG PO TABS: ORAL_TABLET | ORAL | Status: DC

## 2012-06-14 NOTE — Progress Notes (Signed)
Subjective:    Patient ID: Unnamed Hino, female    DOB: Jul 17, 1945, 67 y.o.   MRN: [id number]  HPI  Pt presents to the clinic today with c/o right lower leg pain and right shoulder pain. This is chronic for her. She was seen about a month ago for the same. She was given diclofenac which she feels did help. She is still having trouble lifting her right are above her hand. She denies numbness or tingling. She also would like a blood test to see if she is a diabetic.She states that she has put on a low tof weight and that diabetes runs in her family and she is concerned she may have diabetes. She would also like a flu shot today.  Review of Systems      Past Medical History  Diagnosis Date  . Arthritis   . Asthma   . Cancer   . GERD (gastroesophageal reflux disease)   . Hypertension   . Kidney stones   . Chronic diastolic heart failure   . Hyperlipidemia   . HOCM (hypertrophic obstructive cardiomyopathy)     Echo 12/19/11: Severe LVH, EF 55-65%, dynamic obstruction, wall motion normal, grade 1 diastolic dysfunction, systolic anterior motion of the mitral valve with mild MS and mild MR, mild LAE, PASP 34  . Abnormal MRI, spine 11/2011    ?discitis L5-S1, s/p CT bx 12/12/11  . H/O cardiovascular stress test     Nuclear study in 2011 normal perfusion    Current Outpatient Prescriptions  Medication Sig Dispense Refill  . albuterol (VENTOLIN HFA) 108 (90 BASE) MCG/ACT inhaler Inhale 2 puffs into the lungs 2 (two) times daily.  3 Inhaler  3  . aspirin 81 MG tablet Take 81 mg by mouth. Take 2 pills daily      . cyclobenzaprine (FLEXERIL) 10 MG tablet Take 1 tablet (10 mg total) by mouth 3 (three) times daily as needed for muscle spasms.  30 tablet  1  . furosemide (LASIX) 40 MG tablet Take 40 mg by mouth 2 (two) times daily.      Marland Kitchen lovastatin (MEVACOR) 20 MG tablet Take 40 mg by mouth at bedtime.      . meclizine (ANTIVERT) 25 MG tablet Take by mouth. Take 2 tablets tid prn      .  metoprolol (LOPRESSOR) 50 MG tablet Take 1 tablet (50 mg total) by mouth 2 (two) times daily.  180 tablet  3  . valsartan (DIOVAN) 320 MG tablet Take 320 mg by mouth daily.        No Known Allergies  Family History  Problem Relation Age of Onset  . Arthritis Other   . Heart disease Other   . Hypertension Other   . Diabetes Other   . Alcohol abuse Other   . Arthritis Other   . Cancer Other     Lung Cancer  . Heart disease Other   . Hypertension Other   . Sudden death Other   . Kidney disease Other   . Mental illness Other   . Diabetes Other   . Heart disease Father     History   Social History  . Marital Status: Married    Spouse Name: N/A    Number of Children: 5  . Years of Education: 14   Occupational History  . Retired Airline pilot    Social History Main Topics  . Smoking status: Never Smoker   . Smokeless tobacco: Never Used  . Alcohol Use: No  .  Drug Use: No  . Sexually Active: Not on file   Other Topics Concern  . Not on file   Social History Narrative  . No narrative on file     Constitutional: Denies fever, malaise, fatigue, headache or abrupt weight changes.  Musculoskeletal: Pt reports right shoulder pain and right leg pain. Denies decrease in range of motion, difficulty with gait, muscle pain or joint swelling.  Skin: Denies redness, rashes, lesions or ulcercations.  Neurological: Denies dizziness, difficulty with memory, difficulty with speech or problems with balance and coordination.   No other specific complaints in a complete review of systems (except as listed in HPI above).  Objective:   Physical Exam  BP 112/68  Pulse 66  Temp 97.1 F (36.2 C) (Oral)  Ht 5' (1.524 m)  Wt 228 lb (103.42 kg)  BMI 44.53 kg/m2  SpO2 95% Wt Readings from Last 3 Encounters:  06/14/12 228 lb (103.42 kg)  05/03/12 232 lb (105.235 kg)  04/16/12 232 lb (105.235 kg)    General: Appears her stated age, well developed, well nourished in  NAD. Cardiovascular: Normal rate and rhythm. S1,S2 noted.  No murmur, rubs or gallops noted. No JVD or BLE edema. No carotid bruits noted. Pulmonary/Chest: Normal effort and positive vesicular breath sounds. No respiratory distress. Musculoskeletal: decreased shoulder range of motion. No signs of joint swelling. No difficulty with gait.  Neurological: Alert and oriented. Cranial nerves II-XII intact. Coordination normal. +DTRs bilaterally.        Assessment & Plan:   Shoulder pain, new onset with additional workup required:  eRx for pred taper Will obtain xray of right shoulder  Will check Hgb a1c per patient request Flu shot today  RTC as needed or if symptoms persist

## 2012-06-14 NOTE — Patient Instructions (Signed)
Impingement Syndrome, Rotator Cuff, Bursitis with Rehab Impingement syndrome is a condition that involves inflammation of the tendons of the rotator cuff and the subacromial bursa, that causes pain in the shoulder. The rotator cuff consists of four tendons and muscles that control much of the shoulder and upper arm function. The subacromial bursa is a fluid filled sac that helps reduce friction between the rotator cuff and one of the bones of the shoulder (acromion). Impingement syndrome is usually an overuse injury that causes swelling of the bursa (bursitis), swelling of the tendon (tendonitis), and/or a tear of the tendon (strain). Strains are classified into three categories. Grade 1 strains cause pain, but the tendon is not lengthened. Grade 2 strains include a lengthened ligament, due to the ligament being stretched or partially ruptured. With grade 2 strains there is still function, although the function may be decreased. Grade 3 strains include a complete tear of the tendon or muscle, and function is usually impaired. SYMPTOMS   Pain around the shoulder, often at the outer portion of the upper arm.  Pain that gets worse with shoulder function, especially when reaching overhead or lifting.  Sometimes, aching when not using the arm.  Pain that wakes you up at night.  Sometimes, tenderness, swelling, warmth, or redness over the affected area.  Loss of strength.  Limited motion of the shoulder, especially reaching behind the back (to the back pocket or to unhook bra) or across your body.  Crackling sound (crepitation) when moving the arm.  Biceps tendon pain and inflammation (in the front of the shoulder). Worse when bending the elbow or lifting. CAUSES  Impingement syndrome is often an overuse injury, in which chronic (repetitive) motions cause the tendons or bursa to become inflamed. A strain occurs when a force is paced on the tendon or muscle that is greater than it can withstand.  Common mechanisms of injury include: Stress from sudden increase in duration, frequency, or intensity of training.  Direct hit (trauma) to the shoulder.  Aging, erosion of the tendon with normal use.  Bony bump on shoulder (acromial spur). RISK INCREASES WITH:  Contact sports (football, wrestling, boxing).  Throwing sports (baseball, tennis, volleyball).  Weightlifting and bodybuilding.  Heavy labor.  Previous injury to the rotator cuff, including impingement.  Poor shoulder strength and flexibility.  Failure to warm up properly before activity.  Inadequate protective equipment.  Old age.  Bony bump on shoulder (acromial spur). PREVENTION   Warm up and stretch properly before activity.  Allow for adequate recovery between workouts.  Maintain physical fitness:  Strength, flexibility, and endurance.  Cardiovascular fitness.  Learn and use proper exercise technique. PROGNOSIS  If treated properly, impingement syndrome usually goes away within 6 weeks. Sometimes surgery is required.  RELATED COMPLICATIONS   Longer healing time if not properly treated, or if not given enough time to heal.  Recurring symptoms, that result in a chronic condition.  Shoulder stiffness, frozen shoulder, or loss of motion.  Rotator cuff tendon tear.  Recurring symptoms, especially if activity is resumed too soon, with overuse, with a direct blow, or when using poor technique. TREATMENT  Treatment first involves the use of ice and medicine, to reduce pain and inflammation. The use of strengthening and stretching exercises may help reduce pain with activity. These exercises may be performed at home or with a therapist. If non-surgical treatment is unsuccessful after more than 6 months, surgery may be advised. After surgery and rehabilitation, activity is usually possible in 3 months.    MEDICATION  If pain medicine is needed, nonsteroidal anti-inflammatory medicines (aspirin and  ibuprofen), or other minor pain relievers (acetaminophen), are often advised.  Do not take pain medicine for 7 days before surgery.  Prescription pain relievers may be given, if your caregiver thinks they are needed. Use only as directed and only as much as you need.  Corticosteroid injections may be given by your caregiver. These injections should be reserved for the most serious cases, because they may only be given a certain number of times. HEAT AND COLD  Cold treatment (icing) should be applied for 10 to 15 minutes every 2 to 3 hours for inflammation and pain, and immediately after activity that aggravates your symptoms. Use ice packs or an ice massage.  Heat treatment may be used before performing stretching and strengthening activities prescribed by your caregiver, physical therapist, or athletic trainer. Use a heat pack or a warm water soak. SEEK MEDICAL CARE IF:   Symptoms get worse or do not improve in 4 to 6 weeks, despite treatment.  New, unexplained symptoms develop. (Drugs used in treatment may produce side effects.) EXERCISES  RANGE OF MOTION (ROM) AND STRETCHING EXERCISES - Impingement Syndrome (Rotator Cuff  Tendinitis, Bursitis) These exercises may help you when beginning to rehabilitate your injury. Your symptoms may go away with or without further involvement from your physician, physical therapist or athletic trainer. While completing these exercises, remember:   Restoring tissue flexibility helps normal motion to return to the joints. This allows healthier, less painful movement and activity.  An effective stretch should be held for at least 30 seconds.  A stretch should never be painful. You should only feel a gentle lengthening or release in the stretched tissue. STRETCH  Flexion, Standing  Stand with good posture. With an underhand grip on your right / left hand, and an overhand grip on the opposite hand, grasp a broomstick or cane so that your hands are a little  more than shoulder width apart.  Keeping your right / left elbow straight and shoulder muscles relaxed, push the stick with your opposite hand, to raise your right / left arm in front of your body and then overhead. Raise your arm until you feel a stretch in your right / left shoulder, but before you have increased shoulder pain.  Try to avoid shrugging your right / left shoulder as your arm rises, by keeping your shoulder blade tucked down and toward your mid-back spine. Hold for __________ seconds.  Slowly return to the starting position. Repeat __________ times. Complete this exercise __________ times per day. STRETCH  Abduction, Supine  Lie on your back. With an underhand grip on your right / left hand and an overhand grip on the opposite hand, grasp a broomstick or cane so that your hands are a little more than shoulder width apart.  Keeping your right / left elbow straight and your shoulder muscles relaxed, push the stick with your opposite hand, to raise your right / left arm out to the side of your body and then overhead. Raise your arm until you feel a stretch in your right / left shoulder, but before you have increased shoulder pain.  Try to avoid shrugging your right / left shoulder as your arm rises, by keeping your shoulder blade tucked down and toward your mid-back spine. Hold for __________ seconds.  Slowly return to the starting position. Repeat __________ times. Complete this exercise __________ times per day. ROM  Flexion, Active-Assisted  Lie on your back.   You may bend your knees for comfort.  Grasp a broomstick or cane so your hands are about shoulder width apart. Your right / left hand should grip the end of the stick, so that your hand is positioned "thumbs-up," as if you were about to shake hands.  Using your healthy arm to lead, raise your right / left arm overhead, until you feel a gentle stretch in your shoulder. Hold for __________ seconds.  Use the stick to  assist in returning your right / left arm to its starting position. Repeat __________ times. Complete this exercise __________ times per day.  ROM - Internal Rotation, Supine   Lie on your back on a firm surface. Place your right / left elbow about 60 degrees away from your side. Elevate your elbow with a folded towel, so that the elbow and shoulder are the same height.  Using a broomstick or cane and your strong arm, pull your right / left hand toward your body until you feel a gentle stretch, but no increase in your shoulder pain. Keep your shoulder and elbow in place throughout the exercise.  Hold for __________ seconds. Slowly return to the starting position. Repeat __________ times. Complete this exercise __________ times per day. STRETCH - Internal Rotation  Place your right / left hand behind your back, palm up.  Throw a towel or belt over your opposite shoulder. Grasp the towel with your right / left hand.  While keeping an upright posture, gently pull up on the towel, until you feel a stretch in the front of your right / left shoulder.  Avoid shrugging your right / left shoulder as your arm rises, by keeping your shoulder blade tucked down and toward your mid-back spine.  Hold for __________ seconds. Release the stretch, by lowering your healthy hand. Repeat __________ times. Complete this exercise __________ times per day. ROM - Internal Rotation   Using an underhand grip, grasp a stick behind your back with both hands.  While standing upright with good posture, slide the stick up your back until you feel a mild stretch in the front of your shoulder.  Hold for __________ seconds. Slowly return to your starting position. Repeat __________ times. Complete this exercise __________ times per day.  STRETCH  Posterior Shoulder Capsule   Stand or sit with good posture. Grasp your right / left elbow and draw it across your chest, keeping it at the same height as your  shoulder.  Pull your elbow, so your upper arm comes in closer to your chest. Pull until you feel a gentle stretch in the back of your shoulder.  Hold for __________ seconds. Repeat __________ times. Complete this exercise __________ times per day. STRENGTHENING EXERCISES - Impingement Syndrome (Rotator Cuff Tendinitis, Bursitis) These exercises may help you when beginning to rehabilitate your injury. They may resolve your symptoms with or without further involvement from your physician, physical therapist or athletic trainer. While completing these exercises, remember:  Muscles can gain both the endurance and the strength needed for everyday activities through controlled exercises.  Complete these exercises as instructed by your physician, physical therapist or athletic trainer. Increase the resistance and repetitions only as guided.  You may experience muscle soreness or fatigue, but the pain or discomfort you are trying to eliminate should never worsen during these exercises. If this pain does get worse, stop and make sure you are following the directions exactly. If the pain is still present after adjustments, discontinue the exercise until you can discuss   the trouble with your clinician.  During your recovery, avoid activity or exercises which involve actions that place your injured hand or elbow above your head or behind your back or head. These positions stress the tissues which you are trying to heal. STRENGTH - Scapular Depression and Adduction   With good posture, sit on a firm chair. Support your arms in front of you, with pillows, arm rests, or on a table top. Have your elbows in line with the sides of your body.  Gently draw your shoulder blades down and toward your mid-back spine. Gradually increase the tension, without tensing the muscles along the top of your shoulders and the back of your neck.  Hold for __________ seconds. Slowly release the tension and relax your muscles  completely before starting the next repetition.  After you have practiced this exercise, remove the arm support and complete the exercise in standing as well as sitting position. Repeat __________ times. Complete this exercise __________ times per day.  STRENGTH - Shoulder Abductors, Isometric  With good posture, stand or sit about 4-6 inches from a wall, with your right / left side facing the wall.  Bend your right / left elbow. Gently press your right / left elbow into the wall. Increase the pressure gradually, until you are pressing as hard as you can, without shrugging your shoulder or increasing any shoulder discomfort.  Hold for __________ seconds.  Release the tension slowly. Relax your shoulder muscles completely before you begin the next repetition. Repeat __________ times. Complete this exercise __________ times per day.  STRENGTH - External Rotators, Isometric  Keep your right / left elbow at your side and bend it 90 degrees.  Step into a door frame so that the outside of your right / left wrist can press against the door frame without your upper arm leaving your side.  Gently press your right / left wrist into the door frame, as if you were trying to swing the back of your hand away from your stomach. Gradually increase the tension, until you are pressing as hard as you can, without shrugging your shoulder or increasing any shoulder discomfort.  Hold for __________ seconds.  Release the tension slowly. Relax your shoulder muscles completely before you begin the next repetition. Repeat __________ times. Complete this exercise __________ times per day.  STRENGTH - Supraspinatus   Stand or sit with good posture. Grasp a __________ weight, or an exercise band or tubing, so that your hand is "thumbs-up," like you are shaking hands.  Slowly lift your right / left arm in a "V" away from your thigh, diagonally into the space between your side and straight ahead. Lift your hand to  shoulder height or as far as you can, without increasing any shoulder pain. At first, many people do not lift their hands above shoulder height.  Avoid shrugging your right / left shoulder as your arm rises, by keeping your shoulder blade tucked down and toward your mid-back spine.  Hold for __________ seconds. Control the descent of your hand, as you slowly return to your starting position. Repeat __________ times. Complete this exercise __________ times per day.  STRENGTH - External Rotators  Secure a rubber exercise band or tubing to a fixed object (table, pole) so that it is at the same height as your right / left elbow when you are standing or sitting on a firm surface.  Stand or sit so that the secured exercise band is at your uninjured side.  Bend   your right / left elbow 90 degrees. Place a folded towel or small pillow under your right / left arm, so that your elbow is a few inches away from your side.  Keeping the tension on the exercise band, pull it away from your body, as if pivoting on your elbow. Be sure to keep your body steady, so that the movement is coming only from your rotating shoulder.  Hold for __________ seconds. Release the tension in a controlled manner, as you return to the starting position. Repeat __________ times. Complete this exercise __________ times per day.  STRENGTH - Internal Rotators   Secure a rubber exercise band or tubing to a fixed object (table, pole) so that it is at the same height as your right / left elbow when you are standing or sitting on a firm surface.  Stand or sit so that the secured exercise band is at your right / left side.  Bend your elbow 90 degrees. Place a folded towel or small pillow under your right / left arm so that your elbow is a few inches away from your side.  Keeping the tension on the exercise band, pull it across your body, toward your stomach. Be sure to keep your body steady, so that the movement is coming only from  your rotating shoulder.  Hold for __________ seconds. Release the tension in a controlled manner, as you return to the starting position. Repeat __________ times. Complete this exercise __________ times per day.  STRENGTH - Scapular Protractors, Standing   Stand arms length away from a wall. Place your hands on the wall, keeping your elbows straight.  Begin by dropping your shoulder blades down and toward your mid-back spine.  To strengthen your protractors, keep your shoulder blades down, but slide them forward on your rib cage. It will feel as if you are lifting the back of your rib cage away from the wall. This is a subtle motion and can be challenging to complete. Ask your caregiver for further instruction, if you are not sure you are doing the exercise correctly.  Hold for __________ seconds. Slowly return to the starting position, resting the muscles completely before starting the next repetition. Repeat __________ times. Complete this exercise __________ times per day. STRENGTH - Scapular Protractors, Supine  Lie on your back on a firm surface. Extend your right / left arm straight into the air while holding a __________ weight in your hand.  Keeping your head and back in place, lift your shoulder off the floor.  Hold for __________ seconds. Slowly return to the starting position, and allow your muscles to relax completely before starting the next repetition. Repeat __________ times. Complete this exercise __________ times per day. STRENGTH - Scapular Protractors, Quadruped  Get onto your hands and knees, with your shoulders directly over your hands (or as close as you can be, comfortably).  Keeping your elbows locked, lift the back of your rib cage up into your shoulder blades, so your mid-back rounds out. Keep your neck muscles relaxed.  Hold this position for __________ seconds. Slowly return to the starting position and allow your muscles to relax completely before starting the  next repetition. Repeat __________ times. Complete this exercise __________ times per day.  STRENGTH - Scapular Retractors  Secure a rubber exercise band or tubing to a fixed object (table, pole), so that it is at the height of your shoulders when you are either standing, or sitting on a firm armless chair.  With a   palm down grip, grasp an end of the band in each hand. Straighten your elbows and lift your hands straight in front of you, at shoulder height. Step back, away from the secured end of the band, until it becomes tense.  Squeezing your shoulder blades together, draw your elbows back toward your sides, as you bend them. Keep your upper arms lifted away from your body throughout the exercise.  Hold for __________ seconds. Slowly ease the tension on the band, as you reverse the directions and return to the starting position. Repeat __________ times. Complete this exercise __________ times per day. STRENGTH - Shoulder Extensors   Secure a rubber exercise band or tubing to a fixed object (table, pole) so that it is at the height of your shoulders when you are either standing, or sitting on a firm armless chair.  With a thumbs-up grip, grasp an end of the band in each hand. Straighten your elbows and lift your hands straight in front of you, at shoulder height. Step back, away from the secured end of the band, until it becomes tense.  Squeezing your shoulder blades together, pull your hands down to the sides of your thighs. Do not allow your hands to go behind you.  Hold for __________ seconds. Slowly ease the tension on the band, as you reverse the directions and return to the starting position. Repeat __________ times. Complete this exercise __________ times per day.  STRENGTH - Scapular Retractors and External Rotators   Secure a rubber exercise band or tubing to a fixed object (table, pole) so that it is at the height as your shoulders, when you are either standing, or sitting on a  firm armless chair.  With a palm down grip, grasp an end of the band in each hand. Bend your elbows 90 degrees and lift your elbows to shoulder height, at your sides. Step back, away from the secured end of the band, until it becomes tense.  Squeezing your shoulder blades together, rotate your shoulders so that your upper arms and elbows remain stationary, but your fists travel upward to head height.  Hold for __________ seconds. Slowly ease the tension on the band, as you reverse the directions and return to the starting position. Repeat __________ times. Complete this exercise __________ times per day.  STRENGTH - Scapular Retractors and External Rotators, Rowing   Secure a rubber exercise band or tubing to a fixed object (table, pole) so that it is at the height of your shoulders, when you are either standing, or sitting on a firm armless chair.  With a palm down grip, grasp an end of the band in each hand. Straighten your elbows and lift your hands straight in front of you, at shoulder height. Step back, away from the secured end of the band, until it becomes tense.  Step 1: Squeeze your shoulder blades together. Bending your elbows, draw your hands to your chest, as if you are rowing a boat. At the end of this motion, your hands and elbow should be at shoulder height and your elbows should be out to your sides.  Step 2: Rotate your shoulders, to raise your hands above your head. Your forearms should be vertical and your upper arms should be horizontal.  Hold for __________ seconds. Slowly ease the tension on the band, as you reverse the directions and return to the starting position. Repeat __________ times. Complete this exercise __________ times per day.  STRENGTH  Scapular Depressors  Find a sturdy chair   without wheels, such as a dining room chair.  Keeping your feet on the floor, and your hands on the chair arms, lift your bottom up from the seat, and lock your elbows.  Keeping  your elbows straight, allow gravity to pull your body weight down. Your shoulders will rise toward your ears.  Raise your body against gravity by drawing your shoulder blades down your back, shortening the distance between your shoulders and ears. Although your feet should always maintain contact with the floor, your feet should progressively support less body weight, as you get stronger.  Hold for __________ seconds. In a controlled and slow manner, lower your body weight to begin the next repetition. Repeat __________ times. Complete this exercise __________ times per day.  Document Released: 05/01/2005 Document Revised: 07/24/2011 Document Reviewed: 08/13/2008 [employer] Patient Information 2013 [employer].  

## 2012-06-17 ENCOUNTER — Encounter: Payer: Self-pay | Admitting: Physician Assistant

## 2012-06-17 ENCOUNTER — Encounter: Payer: Medicare Other | Admitting: Physician Assistant

## 2012-06-17 ENCOUNTER — Other Ambulatory Visit: Payer: Self-pay | Admitting: Internal Medicine

## 2012-06-17 DIAGNOSIS — E1165 Type 2 diabetes mellitus with hyperglycemia: Secondary | ICD-10-CM

## 2012-06-17 DIAGNOSIS — IMO0002 Reserved for concepts with insufficient information to code with codable children: Secondary | ICD-10-CM

## 2012-06-17 MED ORDER — METFORMIN HCL 500 MG PO TABS: 500.0000 mg | ORAL_TABLET | Freq: Two times a day (BID) | ORAL | Status: DC

## 2012-07-01 ENCOUNTER — Ambulatory Visit (INDEPENDENT_AMBULATORY_CARE_PROVIDER_SITE_OTHER): Payer: Medicare Other | Admitting: Physician Assistant

## 2012-07-01 DIAGNOSIS — I421 Obstructive hypertrophic cardiomyopathy: Secondary | ICD-10-CM

## 2012-07-01 NOTE — Progress Notes (Signed)
Exercise Treadmill Test  Pre-Exercise Testing Evaluation Rhythm: normal sinus  Rate: 72     Test  Exercise Tolerance Test Ordering MD: Olga Millers, MD  Interpreting MD: Tereso Newcomer, PA-C  Unique Test No: 1  Treadmill:  1  Indication for ETT: HOCM  Contraindication to ETT: No   Stress Modality: exercise - treadmill  Cardiac Imaging Performed: non   Protocol: Naughton - symptom limited  Max BP:  204/79  Max MPHR (bpm):  154 85% MPR (bpm):  131  MPHR obtained (bpm):  102 % MPHR obtained:  66%  Reached 85% MPHR (min:sec):  n/a Total Exercise Time (min-sec):  1:43  Workload in METS:  1.5 Borg Scale: 13  Reason ETT Terminated:  patient's desire to stop    ST Segment Analysis At Rest: LVH with repolarization abnormality With Exercise: No change from baseline  Other Information Arrhythmia:  No Angina during ETT:  absent (0) Quality of ETT:  non-diagnostic  ETT Interpretation:  Non-Diagnostic for ischemia.  Comments: Poor exercise tolerance. No chest pain. Patient with significantly impaired mobility. Walks with a cane and has decreased vision from diabetic retinopathy. We attempted exercise with Naughton protocol. She was quite unsteady and required my assistance to keep her upright.  We stopped the test due to concerns of increased risk of patient falling on the treadmill. BP at one minute was not obtained due to artifact.  Immediate BP in first minute of recovery was elevated (165/99 pre-exercise => 204/79 in 1st minute of recovery).  Not certain this truly documents hypertensive response to exercise.  Recommendations: Will review with Dr. Olga Millers. Follow up as planned. Luna Glasgow, PA-C  1:15 PM 07/01/2012

## 2012-07-05 ENCOUNTER — Telehealth: Payer: Self-pay | Admitting: Internal Medicine

## 2012-07-05 NOTE — Telephone Encounter (Signed)
Patient is requesting a call back to discuss some concerns she has about her diabetes testing

## 2012-07-05 NOTE — Telephone Encounter (Signed)
Pt is calling to follow up on referral for nutritionist for new dx of diabetes, she has never heard anything. Pt also wants to know if she needs to come in to have her glucose checked since she is having eye surgery soon and MD wants to know how her CBG's are before her surgery. Pt also inquired about an rx for a glucometer and testing supplies to check her blood sugar.

## 2012-07-08 ENCOUNTER — Other Ambulatory Visit: Payer: Self-pay | Admitting: Internal Medicine

## 2012-07-08 DIAGNOSIS — E119 Type 2 diabetes mellitus without complications: Secondary | ICD-10-CM

## 2012-07-08 MED ORDER — ONETOUCH DELICA LANCETS 33G MISC: 1.0000 | Freq: Every day | Status: DC

## 2012-07-08 MED ORDER — GLUCOSE BLOOD VI STRP: ORAL_STRIP | Status: DC

## 2012-07-08 NOTE — Telephone Encounter (Signed)
Ash, i have replaced the order for diabetes education. Hopefully she will hear about it this week. Also, it is ok to give her a meter and leave it for her at the front desk. Also could you call her in strips and lancets for whichever meter you choose. This way she can check her blood sugar to let her eye surgeon know how her sugars are running. Rene Kocher

## 2012-07-08 NOTE — Telephone Encounter (Signed)
Pt informed of referral and to stop by office to pick up meter to test blood sugar.

## 2012-07-08 NOTE — Telephone Encounter (Signed)
Left message for pt to callback office.  

## 2012-09-13 ENCOUNTER — Telehealth: Payer: Self-pay | Admitting: Internal Medicine

## 2012-09-13 DIAGNOSIS — M62830 Muscle spasm of back: Secondary | ICD-10-CM

## 2012-09-13 MED ORDER — CYCLOBENZAPRINE HCL 10 MG PO TABS: 10.0000 mg | ORAL_TABLET | Freq: Three times a day (TID) | ORAL | Status: DC | PRN

## 2012-09-13 NOTE — Telephone Encounter (Signed)
Pt req refill for Cyclobenzaprine 10mg  to be send to [facility]. Please advise

## 2012-09-13 NOTE — Telephone Encounter (Signed)
Rx sent to [facility].

## 2012-09-27 ENCOUNTER — Other Ambulatory Visit: Payer: Self-pay | Admitting: Internal Medicine

## 2012-11-27 ENCOUNTER — Other Ambulatory Visit: Payer: Self-pay | Admitting: Cardiology

## 2012-11-27 ENCOUNTER — Other Ambulatory Visit: Payer: Self-pay | Admitting: *Deleted

## 2012-11-27 ENCOUNTER — Other Ambulatory Visit: Payer: Self-pay | Admitting: Internal Medicine

## 2012-11-27 MED ORDER — ONETOUCH DELICA LANCETS 33G MISC: 1.0000 | Freq: Every day | Status: DC

## 2012-11-28 ENCOUNTER — Encounter: Payer: Medicare Other | Attending: Internal Medicine | Admitting: Dietician

## 2012-11-28 ENCOUNTER — Encounter: Payer: Self-pay | Admitting: Dietician

## 2012-11-28 VITALS — Ht 60.0 in | Wt 217.7 lb

## 2012-11-28 DIAGNOSIS — E119 Type 2 diabetes mellitus without complications: Secondary | ICD-10-CM | POA: Insufficient documentation

## 2012-11-28 DIAGNOSIS — Z713 Dietary counseling and surveillance: Secondary | ICD-10-CM | POA: Insufficient documentation

## 2012-11-28 NOTE — Progress Notes (Signed)
  Patient was seen on 11/28/2012 for the first of a series of three diabetes self-management courses at the [facility]. The following learning objectives were met by the patient during this course:   Defines the role of glucose and insulin  Identifies type of diabetes and pathophysiology  Defines the diagnostic criteria for diabetes and prediabetes  States the risk factors for Type 2 Diabetes  States the symptoms of Type 2 Diabetes  Defines Type 2 Diabetes treatment goals  Defines Type 2 Diabetes treatment options  States the rationale for glucose monitoring  Identifies A1C, glucose targets, and testing times  Identifies proper sharps disposal  Defines the purpose of a diabetes food plan  Identifies carbohydrate food groups  Defines effects of carbohydrate foods on glucose levels  Identifies carbohydrate choices/grams/food labels  States benefits of physical activity and effect on glucose  Review of suggested activity guidelines  Handouts given during class include:  Type 2 Diabetes: Basics Book  My Food Plan Book  Food and Activity Log  Follow-Up Plan: Core 2 Class

## 2013-01-01 ENCOUNTER — Ambulatory Visit: Payer: Medicare Other

## 2013-01-29 ENCOUNTER — Other Ambulatory Visit: Payer: Self-pay | Admitting: Cardiology

## 2013-01-29 ENCOUNTER — Other Ambulatory Visit: Payer: Self-pay | Admitting: Internal Medicine

## 2013-01-30 ENCOUNTER — Ambulatory Visit: Payer: Medicare Other

## 2013-01-30 MED ORDER — FUROSEMIDE 40 MG PO TABS: 40.0000 mg | ORAL_TABLET | Freq: Two times a day (BID) | ORAL | Status: DC

## 2013-01-30 NOTE — Addendum Note (Signed)
Addended by: Carmela Hurt on: 01/30/2013 03:58 PM   Modules accepted: Orders, Medications

## 2013-01-31 ENCOUNTER — Other Ambulatory Visit (INDEPENDENT_AMBULATORY_CARE_PROVIDER_SITE_OTHER): Payer: Medicare Other

## 2013-01-31 ENCOUNTER — Ambulatory Visit (INDEPENDENT_AMBULATORY_CARE_PROVIDER_SITE_OTHER): Payer: Medicare Other | Admitting: Internal Medicine

## 2013-01-31 ENCOUNTER — Encounter: Payer: Self-pay | Admitting: Internal Medicine

## 2013-01-31 VITALS — BP 110/70 | HR 66 | Temp 97.2°F | Ht 60.0 in | Wt 217.0 lb

## 2013-01-31 DIAGNOSIS — G8929 Other chronic pain: Secondary | ICD-10-CM

## 2013-01-31 DIAGNOSIS — I809 Phlebitis and thrombophlebitis of unspecified site: Secondary | ICD-10-CM

## 2013-01-31 DIAGNOSIS — J309 Allergic rhinitis, unspecified: Secondary | ICD-10-CM

## 2013-01-31 DIAGNOSIS — Z0000 Encounter for general adult medical examination without abnormal findings: Secondary | ICD-10-CM

## 2013-01-31 DIAGNOSIS — M25569 Pain in unspecified knee: Secondary | ICD-10-CM

## 2013-01-31 DIAGNOSIS — E119 Type 2 diabetes mellitus without complications: Secondary | ICD-10-CM | POA: Insufficient documentation

## 2013-01-31 DIAGNOSIS — IMO0001 Reserved for inherently not codable concepts without codable children: Secondary | ICD-10-CM

## 2013-01-31 DIAGNOSIS — Z23 Encounter for immunization: Secondary | ICD-10-CM

## 2013-01-31 HISTORY — DX: Allergic rhinitis, unspecified: J30.9

## 2013-01-31 HISTORY — DX: Reserved for inherently not codable concepts without codable children: IMO0001

## 2013-01-31 LAB — LIPID PANEL
HDL: 48.1 mg/dL (ref >39.00)
LDL Cholesterol: 108 mg/dL — ABNORMAL HIGH (ref 0–99)
Total CHOL/HDL Ratio: 4
VLDL: 18.2 mg/dL (ref 0.0–40.0)

## 2013-01-31 LAB — CBC WITH DIFFERENTIAL/PLATELET
Basophils Absolute: 0 10*3/uL (ref 0.0–0.1)
Lymphocytes Relative: 31.4 % (ref 12.0–46.0)
Lymphs Abs: 1.4 10*3/uL (ref 0.7–4.0)
Monocytes Relative: 7.8 % (ref 3.0–12.0)
Platelets: 228 10*3/uL (ref 150.0–400.0)
RDW: 15.4 % — ABNORMAL HIGH (ref 11.5–14.6)

## 2013-01-31 LAB — HEPATIC FUNCTION PANEL
AST: 14 U/L (ref 0–37)
Albumin: 4 g/dL (ref 3.5–5.2)
Alkaline Phosphatase: 51 U/L (ref 39–117)
Total Protein: 7.2 g/dL (ref 6.0–8.3)

## 2013-01-31 LAB — BASIC METABOLIC PANEL
CO2: 26 mEq/L (ref 19–32)
Glucose, Bld: 96 mg/dL (ref 70–99)
Potassium: 3.8 mEq/L (ref 3.5–5.1)
Sodium: 137 mEq/L (ref 135–145)

## 2013-01-31 LAB — MICROALBUMIN / CREATININE URINE RATIO
Creatinine,U: 148.6 mg/dL
Microalb, Ur: 1.1 mg/dL (ref 0.0–1.9)

## 2013-01-31 LAB — URINALYSIS, ROUTINE W REFLEX MICROSCOPIC
Hgb urine dipstick: NEGATIVE
Ketones, ur: NEGATIVE
Leukocytes, UA: NEGATIVE
RBC / HPF: NONE SEEN (ref 0–?)
Specific Gravity, Urine: 1.025 (ref 1.000–1.030)
Urobilinogen, UA: 0.2 (ref 0.0–1.0)

## 2013-01-31 LAB — TSH: TSH: 0.34 u[IU]/mL — ABNORMAL LOW (ref 0.35–5.50)

## 2013-01-31 MED ORDER — CYCLOBENZAPRINE HCL 10 MG PO TABS: 10.0000 mg | ORAL_TABLET | Freq: Three times a day (TID) | ORAL | Status: DC | PRN

## 2013-01-31 MED ORDER — ALBUTEROL SULFATE HFA 108 (90 BASE) MCG/ACT IN AERS: 2.0000 | INHALATION_SPRAY | Freq: Two times a day (BID) | RESPIRATORY_TRACT | Status: DC

## 2013-01-31 MED ORDER — FLUTICASONE PROPIONATE 50 MCG/ACT NA SUSP: 2.0000 | Freq: Every day | NASAL | Status: DC

## 2013-01-31 MED ORDER — ACETAMINOPHEN-CODEINE 300-30 MG PO TABS: 1.0000 | ORAL_TABLET | Freq: Four times a day (QID) | ORAL | Status: DC | PRN

## 2013-01-31 NOTE — Progress Notes (Signed)
Subjective:    Patient ID: Alyssa Ingram, female    DOB: 04-Aug-1945, 67 y.o.   MRN: [id number]  HPI  Here for wellness and f/u;  Overall doing ok;  Pt denies CP, worsening SOB, DOE, wheezing, orthopnea, PND, worsening LE edema, palpitations, dizziness or syncope.  Pt denies neurological change such as new headache, facial or extremity weakness.  Pt denies polydipsia, polyuria, or low sugar symptoms. Pt states overall good compliance with treatment and medications, good tolerability, and has been trying to follow lower cholesterol diet.  Pt denies worsening depressive symptoms, suicidal ideation or panic. No fever, night sweats, wt loss, loss of appetite, or other constitutional symptoms.  Pt states good ability with ADL's, has low fall risk, home safety reviewed and adequate, no other significant changes in hearing or vision, and only occasionally active with exercise.Has ongoing chronic left knee pain, but also a 1 wk onset tender warm area to left mid post calf.  Also with some throat discomfort assoc with allergies and post nasal gtt.   Past Medical History  Diagnosis Date  . Arthritis   . Asthma   . Cancer   . GERD (gastroesophageal reflux disease)   . Hypertension   . Kidney stones   . Chronic diastolic heart failure   . Hyperlipidemia   . HOCM (hypertrophic obstructive cardiomyopathy)     Echo 12/19/11: Severe LVH, EF 55-65%, dynamic obstruction, wall motion normal, grade 1 diastolic dysfunction, systolic anterior motion of the mitral valve with mild MS and mild MR, mild LAE, PASP 34  . Abnormal MRI, spine 11/2011    ?discitis L5-S1, s/p CT bx 12/12/11  . H/O cardiovascular stress test     Nuclear study in 2011 normal perfusion  . Obesity   . Type II or unspecified type diabetes mellitus without mention of complication, uncontrolled 01/31/2013  . Allergic rhinitis, cause unspecified 01/31/2013   Past Surgical History  Procedure Laterality Date  . Abdominal hysterectomy    . Rotater cuff       right, 2005  . Joint replacement      right knee 2006  . Nephrolithiasis      reports that she has never smoked. She has never used smokeless tobacco. She reports that she does not drink alcohol or use illicit drugs. family history includes Alcohol abuse in her other; Arthritis in her other and other; Cancer in her other; Diabetes in her other and other; Heart disease in her father, other, and other; Hypertension in her other and other; Kidney disease in her other; Mental illness in her other; Sudden death in her other. No Known Allergies Current Outpatient Prescriptions on File Prior to Visit  Medication Sig Dispense Refill  . aspirin 81 MG tablet Take 81 mg by mouth. Take 2 pills daily      . DIOVAN 320 MG tablet TAKE ONE TABLET BY MOUTH EVERY DAY  90 tablet  0  . furosemide (LASIX) 40 MG tablet Take 1 tablet (40 mg total) by mouth 2 (two) times daily.  60 tablet  0  . glucose blood (ONETOUCH VERIO) test strip Use as instructed to check blood sugar once daily  250.00  100 each  1  . lovastatin (MEVACOR) 20 MG tablet Take 40 mg by mouth at bedtime.      . lovastatin (MEVACOR) 40 MG tablet TAKE TWO TABLETS BY MOUTH EVERY DAY AT BEDTIME  60 tablet  0  . meclizine (ANTIVERT) 25 MG tablet TAKE ONE TABLET BY MOUTH THREE TIMES  DAILY AS NEEDED  60 tablet  0  . metFORMIN (GLUCOPHAGE) 500 MG tablet TAKE ONE TABLET BY MOUTH TWICE DAILY WITH MEALS  180 tablet  0  . metoprolol (LOPRESSOR) 50 MG tablet TAKE ONE TABLET BY MOUTH TWICE DAILY  60 tablet  0  . ONETOUCH DELICA LANCETS 33G MISC 1 each by Other route daily. Use as directed to test blood sugar daily dx 250.00  100 each  3   No current facility-administered medications on file prior to visit.   Review of Systems Constitutional: Negative for diaphoresis, activity change, appetite change or unexpected weight change.  HENT: Negative for hearing loss, ear pain, facial swelling, mouth sores and neck stiffness.   Eyes: Negative for pain, redness  and visual disturbance.  Respiratory: Negative for shortness of breath and wheezing.   Cardiovascular: Negative for chest pain and palpitations.  Gastrointestinal: Negative for diarrhea, blood in stool, abdominal distention or other pain Genitourinary: Negative for hematuria, flank pain or change in urine volume.  Musculoskeletal: Negative for myalgias and joint swelling.  Skin: Negative for color change and wound.  Neurological: Negative for syncope and numbness. other than noted Hematological: Negative for adenopathy.  Psychiatric/Behavioral: Negative for hallucinations, self-injury, decreased concentration and agitation.      Objective:   Physical Exam BP 110/70  Pulse 66  Temp(Src) 97.2 F (36.2 C) (Oral)  Ht 5' (1.524 m)  Wt 217 lb (98.431 kg)  BMI 42.38 kg/m2  SpO2 95% VS noted,  Constitutional: Pt is oriented to person, place, and time. Appears well-developed and well-nourished.  Head: Normocephalic and atraumatic.  Right Ear: External ear normal.  Left Ear: External ear normal.  Nose: Nose normal.  Mouth/Throat: Oropharynx is clear and moist.  Eyes: Conjunctivae and EOM are normal. Pupils are equal, round, and reactive to light.  Bilat tm's with mild erythema.  Max sinus areas non tender.  Pharynx with mild erythema, no exudate Neck: Normal range of motion. Neck supple. No JVD present. No tracheal deviation present.  Cardiovascular: Normal rate, regular rhythm, normal heart sounds and intact distal pulses.   Pulmonary/Chest: Effort normal and breath sounds normal.  Abdominal: Soft. Bowel sounds are normal. There is no tenderness. No HSM  Musculoskeletal: Normal range of motion. Exhibits no edema.  Lymphadenopathy:  Has no cervical adenopathy.  Neurological: Pt is alert and oriented to person, place, and time. Pt has normal reflexes. No cranial nerve deficit.  Skin: Skin is warm and dry. No rash noted. Left post calf with 2 cm area post left calf warm/tender phlebitic  area Psychiatric:  Has  normal mood and affect. Behavior is normal.     Assessment & Plan:

## 2013-01-31 NOTE — Patient Instructions (Addendum)
You had the flu shot and Prevnar (pneumonia) shots today Please take all new medication as prescribed  - the pain medication, and the flonase for the allergies Please continue all other medications as before, and refills have been done if requested, including the inhaler and muscle relaxer Please go to the LAB in the Basement (turn left off the elevator) for the tests to be done today You will be contacted by phone if any changes need to be made immediately.  Otherwise, you will receive a letter about your results with an explanation, but please check with MyChart first. You will be contacted regarding the referral for: colonoscopy Please continue your efforts at being more active, low cholesterol diet, and weight control. You are otherwise up to date with prevention measures today.  Please remember to sign up for My Chart if you have not done so, as this will be important to you in the future with finding out test results, communicating by private email, and scheduling acute appointments online when needed.  Please return in 6 months, or sooner if needed, with Lab testing done 3-5 days before

## 2013-02-02 NOTE — Assessment & Plan Note (Signed)

## 2013-02-02 NOTE — Assessment & Plan Note (Signed)
For flonase asd,  to f/u any worsening symptoms or concerns  

## 2013-02-02 NOTE — Assessment & Plan Note (Signed)
D/w pt, cont asa, hot compress prn

## 2013-02-02 NOTE — Assessment & Plan Note (Signed)
Declines further ortho eval

## 2013-02-02 NOTE — Assessment & Plan Note (Signed)
stable overall by history and exam, recent data reviewed with pt, and pt to continue medical treatment as before,  to f/u any worsening symptoms or concerns Lab Results  Component Value Date   HGBA1C 6.4 01/31/2013

## 2013-02-11 ENCOUNTER — Encounter: Payer: Medicare Other | Attending: Internal Medicine

## 2013-02-11 DIAGNOSIS — Z713 Dietary counseling and surveillance: Secondary | ICD-10-CM | POA: Insufficient documentation

## 2013-02-11 DIAGNOSIS — E119 Type 2 diabetes mellitus without complications: Secondary | ICD-10-CM | POA: Insufficient documentation

## 2013-02-12 NOTE — Progress Notes (Signed)
  Patient was seen on 02/11/13 for the second of a series of three diabetes self-management courses at the [facility]. The following learning objectives were met by the patient during this course:   Explain basic nutrition maintenance and quality assurance  Describe causes, symptoms and treatment of hypoglycemia and hyperglycemia  Explain how to manage diabetes during illness  Describe the importance of good nutrition for health and healthy eating strategies  List strategies to follow meal plan when dining out  Describe the effects of alcohol on glucose and how to use it safely  Describe problem solving skills for day-to-day glucose challenges  Describe strategies to use when treatment plan needs to change  Identify important factors involved in successful weight loss  Describe ways to remain physically active  Describe the impact of regular activity on insulin resistance   Follow-Up Plan: Patient will attend the final class of the ADA Diabetes Self-Care Education.    

## 2013-02-18 ENCOUNTER — Other Ambulatory Visit: Payer: Self-pay | Admitting: Internal Medicine

## 2013-02-19 ENCOUNTER — Encounter: Payer: Medicare Other | Attending: Internal Medicine

## 2013-02-19 DIAGNOSIS — Z713 Dietary counseling and surveillance: Secondary | ICD-10-CM | POA: Insufficient documentation

## 2013-02-19 DIAGNOSIS — E119 Type 2 diabetes mellitus without complications: Secondary | ICD-10-CM | POA: Insufficient documentation

## 2013-02-19 NOTE — Progress Notes (Signed)
Patient was seen on 02/19/13 for the third of a series of three diabetes self-management courses at the [facility]. The following learning objectives were met by the patient during this course:    Describe how diabetes changes over time   Identify diabetes complications and ways to prevent them   Describe strategies that can promote heart health including lowering blood pressure and cholesterol   Describe strategies to lower dietary fat and sodium in the diet   Identify physical activities that benefit cardiovascular health   Describe role of stress on blood glucose and develop strategies to address psychosocial issues   Evaluate success in meeting personal goal   Describe the belief that they can live successfully with diabetes day to day   Establish 2-3 goals that they will plan to diligently work on until they return for the free 70-month follow-up visit  The following handouts were given in class:  Goal setting handout  Class evaluation form  Low-sodium seasoning tips  Stress management handout  Your patient has established the following 4 month goals for diabetes self-care:  Improve sleep habits  Test glucose qd  Your patient has identified these potential barriers to change:  inconsistency  Your patient has identified their diabetes self-care support plan as:  [facility] support group  family   Follow-Up Plan: Patient was offered a 4 month follow-up visit for diabetes self-management education.

## 2013-02-28 ENCOUNTER — Telehealth: Payer: Self-pay

## 2013-02-28 MED ORDER — LOVASTATIN 40 MG PO TABS: 40.0000 mg | ORAL_TABLET | Freq: Every day | ORAL | Status: DC

## 2013-02-28 NOTE — Telephone Encounter (Signed)
Pharmacy requested a 90 day refill

## 2013-03-03 ENCOUNTER — Other Ambulatory Visit: Payer: Self-pay | Admitting: *Deleted

## 2013-03-03 MED ORDER — METFORMIN HCL 500 MG PO TABS: ORAL_TABLET | ORAL | Status: DC

## 2013-03-11 ENCOUNTER — Other Ambulatory Visit: Payer: Self-pay | Admitting: Internal Medicine

## 2013-04-01 ENCOUNTER — Encounter: Payer: Self-pay | Admitting: Internal Medicine

## 2013-05-30 ENCOUNTER — Other Ambulatory Visit: Payer: Self-pay | Admitting: Internal Medicine

## 2013-06-09 ENCOUNTER — Other Ambulatory Visit: Payer: Self-pay | Admitting: Internal Medicine

## 2013-06-19 ENCOUNTER — Ambulatory Visit: Payer: Medicare Other | Admitting: Internal Medicine

## 2013-06-23 ENCOUNTER — Encounter: Payer: Self-pay | Admitting: Physician Assistant

## 2013-06-23 ENCOUNTER — Ambulatory Visit (INDEPENDENT_AMBULATORY_CARE_PROVIDER_SITE_OTHER): Payer: Medicare Other | Admitting: Physician Assistant

## 2013-06-23 VITALS — BP 124/80 | HR 85 | Temp 98.5°F | Ht 60.0 in | Wt 215.0 lb

## 2013-06-23 DIAGNOSIS — J329 Chronic sinusitis, unspecified: Secondary | ICD-10-CM

## 2013-06-23 MED ORDER — AMOXICILLIN-POT CLAVULANATE 875-125 MG PO TABS: 1.0000 | ORAL_TABLET | Freq: Two times a day (BID) | ORAL | Status: DC

## 2013-06-23 MED ORDER — PROMETHAZINE-DM 6.25-15 MG/5ML PO SYRP: 5.0000 mL | ORAL_SOLUTION | Freq: Four times a day (QID) | ORAL | Status: DC | PRN

## 2013-06-23 NOTE — Progress Notes (Signed)
Pre visit review using our clinic review tool, if applicable. No additional management support is needed unless otherwise documented below in the visit note. 

## 2013-06-23 NOTE — Patient Instructions (Signed)
It was great to meet you today Alyssa Ingram!  Prescriptions have been sent to your pharmacy, please take as directed.  If no improvement of symptoms please return to office.  Sinusitis Sinusitis is redness, soreness, and puffiness (inflammation) of the air pockets in the bones of your face (sinuses). The redness, soreness, and puffiness can cause air and mucus to get trapped in your sinuses. This can allow germs to grow and cause an infection.  HOME CARE   Drink enough fluids to keep your pee (urine) clear or pale yellow.  Use a humidifier in your home.  Run a hot shower to create steam in the bathroom. Sit in the bathroom with the door closed. Breathe in the steam 3 4 times a day.  Put a warm, moist washcloth on your face 3 4 times a day, or as told by your doctor.  Use salt water sprays (saline sprays) to wet the thick fluid in your nose. This can help the sinuses drain.  Only take medicine as told by your doctor. GET HELP RIGHT AWAY IF:   Your pain gets worse.  You have very bad headaches.  You are sick to your stomach (nauseous).  You throw up (vomit).  You are very sleepy (drowsy) all the time.  Your face is puffy (swollen).  Your vision changes.  You have a stiff neck.  You have trouble breathing. MAKE SURE YOU:   Understand these instructions.  Will watch your condition.  Will get help right away if you are not doing well or get worse. Document Released: 10/18/2007 Document Revised: 01/24/2012 Document Reviewed: 12/05/2011 [facility] Patient Information 2014 [place].

## 2013-06-23 NOTE — Progress Notes (Signed)
Subjective:     Patient ID: Alyssa Ingram is a 68 y.o. female DOB: [phone] MRN [id number]    HPI  complains of head cold symptoms, ?sinusitus Onset >1 week ago, initially improved then relapsing and worse symptoms  First associated with rhinorrhea, sneezing, sore throat, mild headache and low grade subjective fever Now sinus pressure and mild-mod nasal congestion, yellow-green discharge No relief with OTC meds Precipitated by sick contacts and weather change  Past Medical History  Diagnosis Date  . Arthritis   . Asthma   . Cancer   . GERD (gastroesophageal reflux disease)   . Hypertension   . Kidney stones   . Chronic diastolic heart failure   . Hyperlipidemia   . HOCM (hypertrophic obstructive cardiomyopathy)     Echo 12/19/11: Severe LVH, EF 55-65%, dynamic obstruction, wall motion normal, grade 1 diastolic dysfunction, systolic anterior motion of the mitral valve with mild MS and mild MR, mild LAE, PASP 34  . Abnormal MRI, spine 11/2011    ?discitis L5-S1, s/p CT bx 12/12/11  . H/O cardiovascular stress test     Nuclear study in 2011 normal perfusion  . Obesity   . Type II or unspecified type diabetes mellitus without mention of complication, uncontrolled 01/31/2013  . Allergic rhinitis, cause unspecified 01/31/2013    Review of Systems Constitutional: No night sweats, no unexpected weight change Pulmonary: No pleurisy or hemoptysis Abdomen: no N/V/D or constipation Cardiovascular: No chest pain or palpitations, no new edema     Objective:   Physical Exam BP 124/80  Pulse 85  Temp(Src) 98.5 F (36.9 C) (Oral)  Ht 5' (1.524 m)  Wt 215 lb (97.523 kg)  BMI 41.99 kg/m2  SpO2 90% GEN: mildly ill appearing and audible head/chest congestion HENT: NCAT, mild sinus tenderness bilaterally, nares with thick discharge and turbinate swelling, oropharynx mild erythema and PND, no exudate Eyes: Vision grossly intact, no conjunctivitis Lungs: Clear to auscultation without  rhonchi or wheeze, no increased work of breathing Cardiovascular: Regular rate and rhythm, no bilateral edema  Lab Results  Component Value Date   WBC 4.5 01/31/2013   HGB 11.0* 01/31/2013   HCT 33.6* 01/31/2013   PLT 228.0 01/31/2013   GLUCOSE 96 01/31/2013   CHOL 174 01/31/2013   TRIG 91.0 01/31/2013   HDL 48.10 01/31/2013   LDLCALC 108* 01/31/2013   ALT 11 01/31/2013   AST 14 01/31/2013   NA 137 01/31/2013   K 3.8 01/31/2013   CL 105 01/31/2013   CREATININE 0.8 01/31/2013   BUN 20 01/31/2013   CO2 26 01/31/2013   TSH 0.34* 01/31/2013   INR 1.03 12/12/2011   HGBA1C 6.4 01/31/2013   MICROALBUR 1.1 01/31/2013      Assessment & Plan:  Viral URI > progression to acute sinusitis Cough, postnasal drip related to above   Empiric antibiotics prescribed due to symptom duration greater than 7 days and progression despite OTC symptomatic care Prescription cough suppression - Symptomatic care with Tylenol or Advil, hydration and rest -   Patient to RTO if no improvement of symptoms

## 2013-06-24 ENCOUNTER — Ambulatory Visit: Payer: Medicare Other | Admitting: Internal Medicine

## 2013-06-24 ENCOUNTER — Ambulatory Visit: Payer: Medicare Other | Admitting: *Deleted

## 2013-07-02 ENCOUNTER — Other Ambulatory Visit: Payer: Self-pay | Admitting: Cardiology

## 2013-07-14 ENCOUNTER — Other Ambulatory Visit: Payer: Self-pay | Admitting: Internal Medicine

## 2013-07-31 ENCOUNTER — Ambulatory Visit (INDEPENDENT_AMBULATORY_CARE_PROVIDER_SITE_OTHER): Payer: Medicare Other | Admitting: Internal Medicine

## 2013-07-31 ENCOUNTER — Encounter: Payer: Self-pay | Admitting: Internal Medicine

## 2013-07-31 ENCOUNTER — Other Ambulatory Visit (INDEPENDENT_AMBULATORY_CARE_PROVIDER_SITE_OTHER): Payer: Medicare Other

## 2013-07-31 VITALS — BP 120/70 | HR 78 | Temp 98.0°F | Wt 222.0 lb

## 2013-07-31 DIAGNOSIS — K219 Gastro-esophageal reflux disease without esophagitis: Secondary | ICD-10-CM

## 2013-07-31 DIAGNOSIS — I10 Essential (primary) hypertension: Secondary | ICD-10-CM

## 2013-07-31 DIAGNOSIS — J4530 Mild persistent asthma, uncomplicated: Secondary | ICD-10-CM

## 2013-07-31 DIAGNOSIS — Z0000 Encounter for general adult medical examination without abnormal findings: Secondary | ICD-10-CM

## 2013-07-31 DIAGNOSIS — I509 Heart failure, unspecified: Secondary | ICD-10-CM

## 2013-07-31 DIAGNOSIS — IMO0001 Reserved for inherently not codable concepts without codable children: Secondary | ICD-10-CM

## 2013-07-31 DIAGNOSIS — M7650 Patellar tendinitis, unspecified knee: Secondary | ICD-10-CM

## 2013-07-31 DIAGNOSIS — J45909 Unspecified asthma, uncomplicated: Secondary | ICD-10-CM

## 2013-07-31 DIAGNOSIS — E1165 Type 2 diabetes mellitus with hyperglycemia: Secondary | ICD-10-CM

## 2013-07-31 DIAGNOSIS — M7652 Patellar tendinitis, left knee: Secondary | ICD-10-CM | POA: Insufficient documentation

## 2013-07-31 LAB — BASIC METABOLIC PANEL
BUN: 30 mg/dL — ABNORMAL HIGH (ref 6–23)
CO2: 29 meq/L (ref 19–32)
CREATININE: 1 mg/dL (ref 0.4–1.2)
Calcium: 9.6 mg/dL (ref 8.4–10.5)
Chloride: 100 mEq/L (ref 96–112)
GFR: 62.19 mL/min (ref >60.00)
GLUCOSE: 100 mg/dL — AB (ref 70–99)
Potassium: 4.2 mEq/L (ref 3.5–5.1)
Sodium: 138 mEq/L (ref 135–145)

## 2013-07-31 LAB — HEMOGLOBIN A1C: HEMOGLOBIN A1C: 6.3 % (ref 4.6–6.5)

## 2013-07-31 LAB — HEPATIC FUNCTION PANEL
ALT: 10 U/L (ref 0–35)
AST: 14 U/L (ref 0–37)
Albumin: 4.2 g/dL (ref 3.5–5.2)
Alkaline Phosphatase: 53 U/L (ref 39–117)
Bilirubin, Direct: 0 mg/dL (ref 0.0–0.3)
TOTAL PROTEIN: 8 g/dL (ref 6.0–8.3)
Total Bilirubin: 0.6 mg/dL (ref 0.3–1.2)

## 2013-07-31 LAB — LIPID PANEL
Cholesterol: 180 mg/dL (ref 0–200)
HDL: 52.1 mg/dL (ref >39.00)
LDL Cholesterol: 108 mg/dL — ABNORMAL HIGH (ref 0–99)
TRIGLYCERIDES: 101 mg/dL (ref 0.0–149.0)
Total CHOL/HDL Ratio: 3
VLDL: 20.2 mg/dL (ref 0.0–40.0)

## 2013-07-31 LAB — URINALYSIS, ROUTINE W REFLEX MICROSCOPIC
BILIRUBIN URINE: NEGATIVE
Hgb urine dipstick: NEGATIVE
Ketones, ur: NEGATIVE
Leukocytes, UA: NEGATIVE
NITRITE: NEGATIVE
Specific Gravity, Urine: 1.015 (ref 1.000–1.030)
Total Protein, Urine: NEGATIVE
URINE GLUCOSE: NEGATIVE
Urobilinogen, UA: 0.2 (ref 0.0–1.0)
pH: 6 (ref 5.0–8.0)

## 2013-07-31 LAB — TSH: TSH: 0.66 u[IU]/mL (ref 0.35–5.50)

## 2013-07-31 MED ORDER — ALBUTEROL SULFATE HFA 108 (90 BASE) MCG/ACT IN AERS
2.0000 | INHALATION_SPRAY | Freq: Two times a day (BID) | RESPIRATORY_TRACT | Status: DC
Start: 2013-07-31 — End: 2014-10-07

## 2013-07-31 MED ORDER — MECLIZINE HCL 25 MG PO TABS: 25.0000 mg | ORAL_TABLET | Freq: Three times a day (TID) | ORAL | Status: DC

## 2013-07-31 MED ORDER — FUROSEMIDE 40 MG PO TABS: ORAL_TABLET | ORAL | Status: DC

## 2013-07-31 MED ORDER — PANTOPRAZOLE SODIUM 40 MG PO TBEC: 40.0000 mg | DELAYED_RELEASE_TABLET | Freq: Every day | ORAL | Status: DC

## 2013-07-31 MED ORDER — BUDESONIDE-FORMOTEROL FUMARATE 160-4.5 MCG/ACT IN AERO: 2.0000 | INHALATION_SPRAY | Freq: Two times a day (BID) | RESPIRATORY_TRACT | Status: DC

## 2013-07-31 NOTE — Patient Instructions (Addendum)
Please take all new medication as prescribed   - the steroid inhaler, and the protonix 40 mg per day  Use the symbicort at 2 puffs twice per day (starting with the sample)  OK to incr the lasix to 60 mg twice per day, until the swelling is improved, then cut back to 40 mg twice per day again  You will be contacted regarding the referral for: mammogram, and the colonoscopy  Please continue all other medications as before, and refills have been done if requested. Please have the pharmacy call with any other refills you may need.  Please continue your efforts at being more active, low cholesterol diet, and weight control.  Please go to the LAB in the Basement (turn left off the elevator) for the tests to be done today You will be contacted by phone if any changes need to be made immediately.  Otherwise, you will receive a letter about your results with an explanation, but please check with MyChart first.  Please return in 6 months, or sooner if needed, with Lab testing done 3-5 days before

## 2013-07-31 NOTE — Assessment & Plan Note (Signed)
stable overall by history and exam, recent data reviewed with pt, and pt to continue medical treatment as before,  to f/u any worsening symptoms or concerns BP Readings from Last 3 Encounters:  07/31/13 120/70  06/23/13 124/80  01/31/13 110/70

## 2013-07-31 NOTE — Progress Notes (Signed)
Subjective:    Patient ID: Alyssa Ingram, female    DOB: 05-Apr-1946, 68 y.o.   MRN: [id number]  HPI  Here to f/u, c/o tenderness and pain to area ant knee just below the patella without swelling or recent fall or trauma, worse to stand, better to sit, mild for 1-2 wks. Also overall otherwise doing ok,  Pt denies chest pain, increased sob or doe, wheezing, orthopnea, PND, increased LE swelling, palpitations, dizziness or syncope.  Pt denies polydipsia, polyuria, or low sugar symptoms such as weakness or confusion improved with po intake.  Pt denies new neurological symptoms such as new headache, or facial or extremity weakness or numbness.   Pt states overall good compliance with meds, has been trying to follow lower cholesterol, diabetic diet, with wt overall stable,  but little exercise however. Has not ever had colonoscopy to date, willing to do now.  Past Medical History  Diagnosis Date  . Arthritis   . Asthma   . Cancer   . GERD (gastroesophageal reflux disease)   . Hypertension   . Kidney stones   . Chronic diastolic heart failure   . Hyperlipidemia   . HOCM (hypertrophic obstructive cardiomyopathy)     Echo 12/19/11: Severe LVH, EF 55-65%, dynamic obstruction, wall motion normal, grade 1 diastolic dysfunction, systolic anterior motion of the mitral valve with mild MS and mild MR, mild LAE, PASP 34  . Abnormal MRI, spine 11/2011    ?discitis L5-S1, s/p CT bx 12/12/11  . H/O cardiovascular stress test     Nuclear study in 2011 normal perfusion  . Obesity   . Type II or unspecified type diabetes mellitus without mention of complication, uncontrolled 01/31/2013  . Allergic rhinitis, cause unspecified 01/31/2013   Past Surgical History  Procedure Laterality Date  . Abdominal hysterectomy    . Rotater cuff      right, 2005  . Joint replacement      right knee 2006  . Nephrolithiasis      reports that she has never smoked. She has never used smokeless tobacco. She reports that she does  not drink alcohol or use illicit drugs. family history includes Alcohol abuse in her other; Arthritis in her other and other; Cancer in her other; Diabetes in her other and other; Heart disease in her father, other, and other; Hypertension in her other and other; Kidney disease in her other; Mental illness in her other; Sudden death in her other. No Known Allergies Current Outpatient Prescriptions on File Prior to Visit  Medication Sig Dispense Refill  . Acetaminophen-Codeine (TYLENOL/CODEINE #3) 300-30 MG per tablet Take 1 tablet by mouth every 6 (six) hours as needed for pain.  60 tablet  0  . aspirin 81 MG tablet Take 81 mg by mouth. Take 2 pills daily      . cyclobenzaprine (FLEXERIL) 10 MG tablet Take 1 tablet (10 mg total) by mouth 3 (three) times daily as needed for muscle spasms.  90 tablet  6  . DIOVAN 320 MG tablet TAKE ONE TABLET BY MOUTH ONCE DAILY  90 tablet  3  . fluticasone (FLONASE) 50 MCG/ACT nasal spray Place 2 sprays into the nose daily.  16 g  2  . glucose blood (ONETOUCH VERIO) test strip Use as instructed to check blood sugar once daily  250.00  100 each  1  . lovastatin (MEVACOR) 20 MG tablet Take 40 mg by mouth at bedtime.      . lovastatin (MEVACOR) 40 MG tablet  Take 1 tablet (40 mg total) by mouth at bedtime.  180 tablet  3  . metFORMIN (GLUCOPHAGE) 500 MG tablet TAKE ONE TABLET BY MOUTH TWICE DAILY WITH MEALS  180 tablet  3  . metoprolol (LOPRESSOR) 50 MG tablet TAKE ONE TABLET BY MOUTH TWICE DAILY  60 tablet  11  . ONETOUCH DELICA LANCETS 09T MISC 1 each by Other route daily. Use as directed to test blood sugar daily dx 250.00  100 each  3   No current facility-administered medications on file prior to visit.     Review of Systems  Constitutional: Negative for unexpected weight change, or unusual diaphoresis  HENT: Negative for tinnitus.   Eyes: Negative for photophobia and visual disturbance.  Respiratory: Negative for choking and stridor.   Gastrointestinal:  Negative for vomiting and blood in stool.  Genitourinary: Negative for hematuria and decreased urine volume.  Musculoskeletal: Negative for acute joint swelling Skin: Negative for color change and wound.  Neurological: Negative for tremors and numbness other than noted  Psychiatric/Behavioral: Negative for decreased concentration or  hyperactivity.       Objective:   Physical Exam BP 120/70  Pulse 78  Temp(Src) 98 F (36.7 C) (Oral)  Wt 222 lb (100.699 kg)  SpO2 90% VS noted,  Constitutional: Pt appears well-developed and well-nourished.  HENT: Head: NCAT.  Right Ear: External ear normal.  Left Ear: External ear normal.  Eyes: Conjunctivae and EOM are normal. Pupils are equal, round, and reactive to light.  Neck: Normal range of motion. Neck supple.  Cardiovascular: Normal rate and regular rhythm.   Pulmonary/Chest: Effort normal and breath sounds normal.  - no rales or wheezing Abd:  Soft, NT, non-distended, + BS Neurological: Pt is alert. Not confused  Skin: Skin is warm. No erythema. 1-2+ edema to knees bilat Psychiatric: Pt behavior is normal. Thought content normal.  Mild tender left patellar tendon    Assessment & Plan:

## 2013-07-31 NOTE — Assessment & Plan Note (Signed)
Mild uncontrolled, to add symbicort asd,  to f/u any worsening symptoms or concerns 

## 2013-07-31 NOTE — Assessment & Plan Note (Signed)
Mild, d/w pt, for tylenol prn

## 2013-07-31 NOTE — Assessment & Plan Note (Addendum)
For incr lasix to 60 bid, but to return to 40 bid with wt loss and edema resolving, o/w stable overall by history and exam, and pt to continue medical treatment as before,  to f/u any worsening symptoms or concerns  Note:  Total time for pt hx, exam, review of record with pt in the room, determination of diagnoses and plan for further eval and tx is > 40 min, with over 50% spent in coordination and counseling of patient

## 2013-07-31 NOTE — Progress Notes (Signed)
Pre visit review using our clinic review tool, if applicable. No additional management support is needed unless otherwise documented below in the visit note. 

## 2013-07-31 NOTE — Assessment & Plan Note (Signed)
Uncontrolled, mild persistent, for protonix 40 qd,  to f/u any worsening symptoms or concerns

## 2013-07-31 NOTE — Assessment & Plan Note (Signed)
stable overall by history and exam, recent data reviewed with pt, and pt to continue medical treatment as before,  to f/u any worsening symptoms or concerns Lab Results  Component Value Date   HGBA1C 6.3 07/31/2013

## 2013-08-01 ENCOUNTER — Encounter: Payer: Self-pay | Admitting: Internal Medicine

## 2013-08-06 ENCOUNTER — Other Ambulatory Visit: Payer: Self-pay | Admitting: Internal Medicine

## 2013-08-06 DIAGNOSIS — Z1231 Encounter for screening mammogram for malignant neoplasm of breast: Secondary | ICD-10-CM

## 2013-08-29 ENCOUNTER — Ambulatory Visit
Admission: RE | Admit: 2013-08-29 | Discharge: 2013-08-29 | Disposition: A | Discharge status: Home / Self Care | Payer: Medicare Other | Source: Ambulatory Visit | Attending: Internal Medicine | Admitting: Internal Medicine

## 2013-08-29 DIAGNOSIS — Z1231 Encounter for screening mammogram for malignant neoplasm of breast: Secondary | ICD-10-CM

## 2013-09-12 ENCOUNTER — Other Ambulatory Visit: Payer: Self-pay | Admitting: Internal Medicine

## 2013-09-12 ENCOUNTER — Other Ambulatory Visit: Payer: Self-pay

## 2013-09-12 MED ORDER — VALSARTAN 320 MG PO TABS: 320.0000 mg | ORAL_TABLET | Freq: Every day | ORAL | Status: DC

## 2013-09-18 ENCOUNTER — Ambulatory Visit (AMBULATORY_SURGERY_CENTER): Payer: Self-pay

## 2013-09-18 VITALS — Ht 59.0 in | Wt 212.0 lb

## 2013-09-18 DIAGNOSIS — Z1211 Encounter for screening for malignant neoplasm of colon: Secondary | ICD-10-CM

## 2013-09-18 MED ORDER — MOVIPREP 100 G PO SOLR: 1.0000 | Freq: Once | ORAL | Status: DC

## 2013-09-18 NOTE — Progress Notes (Signed)
Pt denies hx of colonoscopy in [place].

## 2013-09-18 NOTE — Progress Notes (Signed)
No allergies to eggs or soy No home oxygen No diet/weight loss meds No past problems with anesthesia  Has email  Emmi instructions given for colonoscopy 

## 2013-09-23 ENCOUNTER — Encounter: Payer: Self-pay | Admitting: Internal Medicine

## 2013-09-25 ENCOUNTER — Encounter: Payer: Self-pay | Admitting: Internal Medicine

## 2013-10-01 ENCOUNTER — Encounter: Payer: Medicare Other | Admitting: Internal Medicine

## 2013-11-26 ENCOUNTER — Encounter: Payer: Medicare Other | Admitting: Internal Medicine

## 2013-11-28 ENCOUNTER — Other Ambulatory Visit: Payer: Self-pay | Admitting: Internal Medicine

## 2014-02-04 ENCOUNTER — Encounter: Payer: Medicare Other | Admitting: Internal Medicine

## 2014-02-04 DIAGNOSIS — Z0289 Encounter for other administrative examinations: Secondary | ICD-10-CM

## 2014-02-07 ENCOUNTER — Other Ambulatory Visit: Payer: Self-pay | Admitting: Internal Medicine

## 2014-03-06 ENCOUNTER — Telehealth: Payer: Self-pay | Admitting: Internal Medicine

## 2014-03-06 ENCOUNTER — Ambulatory Visit: Payer: Medicare Other | Admitting: Internal Medicine

## 2014-03-06 ENCOUNTER — Other Ambulatory Visit: Payer: Self-pay | Admitting: Internal Medicine

## 2014-03-06 MED ORDER — GLUCOSE BLOOD VI STRP: 1.0000 | ORAL_STRIP | Freq: Every day | Status: DC

## 2014-03-06 MED ORDER — ONETOUCH ULTRA 2 W/DEVICE KIT: PACK | Status: DC

## 2014-03-06 MED ORDER — ONETOUCH DELICA LANCETS 33G MISC: 1.0000 | Freq: Every day | Status: DC

## 2014-03-06 NOTE — Telephone Encounter (Signed)
Patient would like a Meter, Strips, and Lancets, called into her pharmacy, it doesn't matter what kind she stated.

## 2014-03-06 NOTE — Telephone Encounter (Signed)
Inform pt sent over monitor w/supplies to [facility]...Alyssa Ingram

## 2014-03-10 ENCOUNTER — Ambulatory Visit: Payer: Medicare Other | Admitting: Internal Medicine

## 2014-03-11 ENCOUNTER — Encounter: Payer: Self-pay | Admitting: Internal Medicine

## 2014-03-11 ENCOUNTER — Ambulatory Visit (INDEPENDENT_AMBULATORY_CARE_PROVIDER_SITE_OTHER): Payer: Medicare Other | Admitting: Internal Medicine

## 2014-03-11 VITALS — BP 112/72 | HR 77 | Temp 98.5°F | Wt 217.0 lb

## 2014-03-11 DIAGNOSIS — Z23 Encounter for immunization: Secondary | ICD-10-CM

## 2014-03-11 DIAGNOSIS — I5032 Chronic diastolic (congestive) heart failure: Secondary | ICD-10-CM

## 2014-03-11 DIAGNOSIS — I10 Essential (primary) hypertension: Secondary | ICD-10-CM

## 2014-03-11 DIAGNOSIS — M79605 Pain in left leg: Secondary | ICD-10-CM

## 2014-03-11 MED ORDER — ACETAMINOPHEN-CODEINE 300-30 MG PO TABS: 1.0000 | ORAL_TABLET | Freq: Four times a day (QID) | ORAL | Status: DC | PRN

## 2014-03-11 NOTE — Patient Instructions (Signed)
Please take all new medication as prescribed - the pain medication  You will be contacted regarding the referral for: left leg doppler test to make sure no blood clot  Please avoid drinking excessive fluids  Please continue all other medications as before  Please have the pharmacy call with any other refills you may need.  Please keep your appointments with your specialists as you may have planned

## 2014-03-11 NOTE — Progress Notes (Signed)
Pre visit review using our clinic review tool, if applicable. No additional management support is needed unless otherwise documented below in the visit note. 

## 2014-03-11 NOTE — Progress Notes (Signed)
Subjective:    Patient ID: Alyssa Ingram, female    DOB: 1945-05-24, 68 y.o.   MRN: [id number]  HPI  Here with 2 wks onset worse persistent LLE swelling and discomfort mostly below the knee, mild to mod, but worse in the past few days mostly to the left calf, assoc with distal LLE/foot not able to wear shoe easily; without fever, no hx of dvt or pe.  Has certainly been drinking plenty of fluids daily as she thought this might help.  Concerned about neuropathy or FMS b/c read about this on the internet.  No fever, ulcers, redness or trauma.  Has known HOCM and diast CHF.  Swelling tends to go down about half at night, worse by end of next day.  Has compression stockings but feels she is not able to wear them. Past Medical History  Diagnosis Date  . Arthritis   . Asthma   . Cancer   . GERD (gastroesophageal reflux disease)   . Hypertension   . Kidney stones   . Chronic diastolic heart failure   . Hyperlipidemia   . HOCM (hypertrophic obstructive cardiomyopathy)     Echo 12/19/11: Severe LVH, EF 55-65%, dynamic obstruction, wall motion normal, grade 1 diastolic dysfunction, systolic anterior motion of the mitral valve with mild MS and mild MR, mild LAE, PASP 34  . Abnormal MRI, spine 11/2011    ?discitis L5-S1, s/p CT bx 12/12/11  . H/O cardiovascular stress test     Nuclear study in 2011 normal perfusion  . Obesity   . Type II or unspecified type diabetes mellitus without mention of complication, uncontrolled 01/31/2013  . Allergic rhinitis, cause unspecified 01/31/2013   Past Surgical History  Procedure Laterality Date  . Abdominal hysterectomy    . Rotater cuff      right, 2005  . Joint replacement      right knee 2006  . Nephrolithiasis      reports that she has never smoked. She has never used smokeless tobacco. She reports that she does not drink alcohol or use illicit drugs. family history includes Alcohol abuse in her other; Arthritis in her other and other; Cancer in her other;  Diabetes in her other and other; Heart disease in her father, other, and other; Hypertension in her other and other; Kidney disease in her other; Mental illness in her other; Sudden death in her other. There is no history of Colon cancer. No Known Allergies Current Outpatient Prescriptions on File Prior to Visit  Medication Sig Dispense Refill  . albuterol (VENTOLIN HFA) 108 (90 BASE) MCG/ACT inhaler Inhale 2 puffs into the lungs 2 (two) times daily.  3 Inhaler  3  . aspirin 81 MG tablet Take 81 mg by mouth. Take 2 pills daily      . Blood Glucose Monitoring Suppl (ONE TOUCH ULTRA 2) W/DEVICE KIT Use to check blood sugars daily Dx  1 each  0  . budesonide-formoterol (SYMBICORT) 160-4.5 MCG/ACT inhaler Inhale 2 puffs into the lungs 2 (two) times daily.  1 Inhaler  11  . cyclobenzaprine (FLEXERIL) 10 MG tablet Take 1 tablet (10 mg total) by mouth 3 (three) times daily as needed for muscle spasms.  90 tablet  6  . cyclobenzaprine (FLEXERIL) 10 MG tablet TAKE ONE TABLET BY MOUTH THREE TIMES DAILY AS NEEDED FOR MUSCLE SPASM  30 tablet  0  . fluticasone (FLONASE) 50 MCG/ACT nasal spray Place 2 sprays into the nose daily.  16 g  2  .  furosemide (LASIX) 40 MG tablet TAKE ONE & ONE-HALF TABLETS BY MOUTH TWICE DAILY  90 tablet  3  . glucose blood (ONE TOUCH ULTRA TEST) test strip 1 each by Other route daily. Use as instructed  30 each  11  . lovastatin (MEVACOR) 20 MG tablet Take 40 mg by mouth at bedtime.      . meclizine (ANTIVERT) 25 MG tablet Take 1 tablet (25 mg total) by mouth 3 (three) times daily.  60 tablet  0  . meclizine (ANTIVERT) 25 MG tablet TAKE ONE TABLET BY MOUTH THREE TIMES DAILY AS NEEDED  60 tablet  0  . metFORMIN (GLUCOPHAGE) 500 MG tablet TAKE ONE TABLET BY MOUTH TWICE DAILY WITH MEALS  180 tablet  3  . metoprolol (LOPRESSOR) 50 MG tablet TAKE ONE TABLET BY MOUTH TWICE DAILY  60 tablet  11  . MOVIPREP 100 G SOLR Take 1 kit (200 g total) by mouth once.  1 kit  0  . ONETOUCH DELICA  LANCETS 33A MISC 1 each by Other route daily. Use as directed to test blood sugar daily dx 250.00  100 each  3  . pantoprazole (PROTONIX) 40 MG tablet Take 1 tablet (40 mg total) by mouth daily.  90 tablet  3  . valsartan (DIOVAN) 320 MG tablet Take 1 tablet (320 mg total) by mouth daily.  90 tablet  3   No current facility-administered medications on file prior to visit.   Review of Systems  Constitutional: Negative for unusual diaphoresis or other sweats  HENT: Negative for ringing in ear Eyes: Negative for double vision or worsening visual disturbance.  Respiratory: Negative for choking and stridor.   Gastrointestinal: Negative for vomiting or other signifcant bowel change Genitourinary: Negative for hematuria or decreased urine volume.  Musculoskeletal: Negative for other MSK pain or swelling Skin: Negative for color change and worsening wound.  Neurological: Negative for tremors and numbness other than noted  Psychiatric/Behavioral: Negative for decreased concentration or agitation other than above       Objective:   Physical Exam BP 112/72  Pulse 77  Temp(Src) 98.5 F (36.9 C) (Oral)  Wt 217 lb (98.431 kg)  SpO2 95% VS noted,  Constitutional: Pt appears well-developed, well-nourished.  HENT: Head: NCAT.  Right Ear: External ear normal.  Left Ear: External ear normal.  Eyes: . Pupils are equal, round, and reactive to light. Conjunctivae and EOM are normal Neck: Normal range of motion. Neck supple.  Cardiovascular: Normal rate and regular rhythm.   Pulmonary/Chest: Effort normal and breath sounds normal.  Neurological: Pt is alert. Not confused , motor grossly intact Skin: Skin is warm. No rash  LLE 2-3+ edema below knee, trace to 1+ RLE below knee, no knee effusions, has tender post calf, no cords, equivocal homans sign, o/w neurovasc intact Psychiatric: Pt behavior is normal. No agitation.     Assessment & Plan:

## 2014-03-12 DIAGNOSIS — M79605 Pain in left leg: Secondary | ICD-10-CM | POA: Insufficient documentation

## 2014-03-12 NOTE — Assessment & Plan Note (Signed)
O/w stable overall by history and exam, and pt to continue medical treatment as before,  to f/u any worsening symptoms or concerns, to cont lasix, consider increase for persistent swelling if other tx not helping, for daily wts

## 2014-03-12 NOTE — Assessment & Plan Note (Signed)
stable overall by history and exam, recent data reviewed with pt, and pt to continue medical treatment as before,  to f/u any worsening symptoms or concerns BP Readings from Last 3 Encounters:  03/11/14 112/72  07/31/13 120/70  06/23/13 124/80

## 2014-03-12 NOTE — Assessment & Plan Note (Signed)
Etiology unclear, cant r/o DVT so for LLE venous doppler, though suspect some element of swelling and pain to LE's aggrevated by forced increased po fluid intake, venous insuff, and possibly diast chf.  ; for low salt diet, no further increased po fluids, leg elevation, compression stocking,s and contd diuretic use with dialy wts.

## 2014-03-16 ENCOUNTER — Encounter: Payer: Self-pay | Admitting: Internal Medicine

## 2014-03-16 ENCOUNTER — Ambulatory Visit (HOSPITAL_COMMUNITY): Payer: Medicare Other | Attending: Cardiovascular Disease | Admitting: Cardiology

## 2014-03-16 DIAGNOSIS — M7989 Other specified soft tissue disorders: Secondary | ICD-10-CM | POA: Diagnosis present

## 2014-03-16 DIAGNOSIS — E119 Type 2 diabetes mellitus without complications: Secondary | ICD-10-CM | POA: Diagnosis not present

## 2014-03-16 DIAGNOSIS — E785 Hyperlipidemia, unspecified: Secondary | ICD-10-CM | POA: Insufficient documentation

## 2014-03-16 DIAGNOSIS — I10 Essential (primary) hypertension: Secondary | ICD-10-CM | POA: Diagnosis not present

## 2014-03-16 DIAGNOSIS — M79605 Pain in left leg: Secondary | ICD-10-CM

## 2014-03-16 NOTE — Progress Notes (Signed)
Left lower venous duplex performed  

## 2014-04-14 ENCOUNTER — Encounter: Payer: Self-pay | Admitting: Internal Medicine

## 2014-04-14 ENCOUNTER — Telehealth: Payer: Self-pay | Admitting: *Deleted

## 2014-04-14 ENCOUNTER — Ambulatory Visit (INDEPENDENT_AMBULATORY_CARE_PROVIDER_SITE_OTHER): Payer: Medicare Other | Admitting: Internal Medicine

## 2014-04-14 VITALS — BP 130/80 | HR 97 | Temp 98.1°F | Ht 60.0 in | Wt 211.5 lb

## 2014-04-14 DIAGNOSIS — I872 Venous insufficiency (chronic) (peripheral): Secondary | ICD-10-CM | POA: Insufficient documentation

## 2014-04-14 DIAGNOSIS — Z0000 Encounter for general adult medical examination without abnormal findings: Secondary | ICD-10-CM

## 2014-04-14 DIAGNOSIS — L03116 Cellulitis of left lower limb: Secondary | ICD-10-CM | POA: Insufficient documentation

## 2014-04-14 DIAGNOSIS — E119 Type 2 diabetes mellitus without complications: Secondary | ICD-10-CM

## 2014-04-14 DIAGNOSIS — J209 Acute bronchitis, unspecified: Secondary | ICD-10-CM

## 2014-04-14 MED ORDER — SULFAMETHOXAZOLE-TRIMETHOPRIM 800-160 MG PO TABS
1.0000 | ORAL_TABLET | Freq: Two times a day (BID) | ORAL | Status: DC
Start: 2014-04-14 — End: 2014-07-07

## 2014-04-14 MED ORDER — HYDROCODONE-HOMATROPINE 5-1.5 MG/5ML PO SYRP: 5.0000 mL | ORAL_SOLUTION | Freq: Four times a day (QID) | ORAL | Status: DC | PRN

## 2014-04-14 NOTE — Telephone Encounter (Signed)
[facility] Triage Call Report Triage Record Num: [id number] Operator: Driscilla Grammes Patient Name: Alyssa Ingram Call Date & Time: 04/09/2014 11:57:24AM Patient Phone: [phone] PCP: Cathlean Cower Patient Gender: Female PCP Fax : [phone] Patient DOB: 02/12/1946 Practice Name: [facility] Reason for Call: Caller: [name]/Patient; PCP: Cathlean Cower (Adults only); CB#: [phone]; Call regarding NOSEBLEED; Pt contacted office for a nosebleed x 1 hour. Pt states that when RN got on the phone, the nosebleed stopped. RN started triage by asking pt if she had a way to take her BP, and pt states that she does. Pt then denied triage, stating that she wanted to clean up, take her morning medications and take her BP, and contact office back if nosebleed recurred or if BP was high. Triaged per PCP Calls, No Triage (Adult) Guideline, Provide Information or Advice Only disposition for "Other nonurgent information for PCP AND does not require PCP response". Call back parameters given. Pt verbalized understanding. Protocol(s) Used: PCP Calls, No Triage (Adult) Recommended Outcome per Protocol: Provide Information or Advice Only Reason for Outcome: [1] Other nonurgent information for PCP AND [2] does not require PCP response Care Advice: ~ 11/

## 2014-04-14 NOTE — Progress Notes (Signed)
Subjective:    Patient ID: Alyssa Ingram, female    DOB: 1945-11-02, 68 y.o.   MRN: [id number]  HPI  Here for wellness and f/u;  Overall doing ok;  Pt denies CP, worsening SOB, DOE, wheezing, orthopnea, PND, worsening LE edema, palpitations, dizziness or syncope.  Pt denies neurological change such as new headache, facial or extremity weakness.  Pt denies polydipsia, polyuria, or low sugar symptoms. Pt states overall good compliance with treatment and medications, good tolerability, and has been trying to follow lower cholesterol diet.  Pt denies worsening depressive symptoms, suicidal ideation or panic. No fever, night sweats, wt loss, loss of appetite, or other constitutional symptoms.  Pt states good ability with ADL's, has low fall risk, home safety reviewed and adequate, no other significant changes in hearing or vision, and only occasionally active with exercise.  Incidentally - Here with acute onset mild to mod 2-3 days ST, HA, general weakness and malaise, with nonprod cough clear sputum.  Also with new onset 3 days tender, red, swelling to leg just above the ankle without ulcer, draainage, red streaks or chills.  Does have ongoing chronic mild periph edema, better in the am after lying down at night, then returns by evening.  Due for podiatry exam with DM hx. Past Medical History  Diagnosis Date  . Arthritis   . Asthma   . Cancer   . GERD (gastroesophageal reflux disease)   . Hypertension   . Kidney stones   . Chronic diastolic heart failure   . Hyperlipidemia   . HOCM (hypertrophic obstructive cardiomyopathy)     Echo 12/19/11: Severe LVH, EF 55-65%, dynamic obstruction, wall motion normal, grade 1 diastolic dysfunction, systolic anterior motion of the mitral valve with mild MS and mild MR, mild LAE, PASP 34  . Abnormal MRI, spine 11/2011    ?discitis L5-S1, s/p CT bx 12/12/11  . H/O cardiovascular stress test     Nuclear study in 2011 normal perfusion  . Obesity   . Type II or  unspecified type diabetes mellitus without mention of complication, uncontrolled 01/31/2013  . Allergic rhinitis, cause unspecified 01/31/2013   Past Surgical History  Procedure Laterality Date  . Abdominal hysterectomy    . Rotater cuff      right, 2005  . Joint replacement      right knee 2006  . Nephrolithiasis      reports that she has never smoked. She has never used smokeless tobacco. She reports that she does not drink alcohol or use illicit drugs. family history includes Alcohol abuse in her other; Arthritis in her other and other; Cancer in her other; Diabetes in her other and other; Heart disease in her father, other, and other; Hypertension in her other and other; Kidney disease in her other; Mental illness in her other; Sudden death in her other. There is no history of Colon cancer. No Known Allergies Current Outpatient Prescriptions on File Prior to Visit  Medication Sig Dispense Refill  . albuterol (VENTOLIN HFA) 108 (90 BASE) MCG/ACT inhaler Inhale 2 puffs into the lungs 2 (two) times daily. 3 Inhaler 3  . aspirin 81 MG tablet Take 81 mg by mouth. Take 2 pills daily    . Blood Glucose Monitoring Suppl (ONE TOUCH ULTRA 2) W/DEVICE KIT Use to check blood sugars daily Dx 1 each 0  . budesonide-formoterol (SYMBICORT) 160-4.5 MCG/ACT inhaler Inhale 2 puffs into the lungs 2 (two) times daily. 1 Inhaler 11  . cyclobenzaprine (FLEXERIL) 10 MG tablet  Take 1 tablet (10 mg total) by mouth 3 (three) times daily as needed for muscle spasms. 90 tablet 6  . fluticasone (FLONASE) 50 MCG/ACT nasal spray Place 2 sprays into the nose daily. 16 g 2  . furosemide (LASIX) 40 MG tablet TAKE ONE & ONE-HALF TABLETS BY MOUTH TWICE DAILY 90 tablet 3  . glucose blood (ONE TOUCH ULTRA TEST) test strip 1 each by Other route daily. Use as instructed 30 each 11  . lovastatin (MEVACOR) 20 MG tablet Take 40 mg by mouth at bedtime.    . meclizine (ANTIVERT) 25 MG tablet Take 1 tablet (25 mg total) by mouth 3  (three) times daily. 60 tablet 0  . metFORMIN (GLUCOPHAGE) 500 MG tablet TAKE ONE TABLET BY MOUTH TWICE DAILY WITH MEALS 180 tablet 3  . metoprolol (LOPRESSOR) 50 MG tablet TAKE ONE TABLET BY MOUTH TWICE DAILY 60 tablet 11  . MOVIPREP 100 G SOLR Take 1 kit (200 g total) by mouth once. 1 kit 0  . ONETOUCH DELICA LANCETS 26S MISC 1 each by Other route daily. Use as directed to test blood sugar daily dx 250.00 100 each 3  . pantoprazole (PROTONIX) 40 MG tablet Take 1 tablet (40 mg total) by mouth daily. 90 tablet 3  . valsartan (DIOVAN) 320 MG tablet Take 1 tablet (320 mg total) by mouth daily. 90 tablet 3   No current facility-administered medications on file prior to visit.   Review of Systems Constitutional: Negative for increased diaphoresis, other activity, appetite or other siginficant weight change  HENT: Negative for worsening hearing loss, ear pain, facial swelling, mouth sores and neck stiffness.   Eyes: Negative for other worsening pain, redness or visual disturbance.  Respiratory: Negative for shortness of breath and wheezing.   Cardiovascular: Negative for chest pain and palpitations.  Gastrointestinal: Negative for diarrhea, blood in stool, abdominal distention or other pain Genitourinary: Negative for hematuria, flank pain or change in urine volume.  Musculoskeletal: Negative for myalgias or other joint complaints.  Skin: Negative for color change and wound.  Neurological: Negative for syncope and numbness. other than noted Hematological: Negative for adenopathy. or other swelling Psychiatric/Behavioral: Negative for hallucinations, self-injury, decreased concentration or other worsening agitation.      Objective:   Physical Exam BP 130/80 mmHg  Pulse 97  Temp(Src) 98.1 F (36.7 C) (Oral)  Ht 5' (1.524 m)  Wt 211 lb 8 oz (95.936 kg)  BMI 41.31 kg/m2  SpO2 95% VS noted, mild ill Constitutional: Pt is oriented to person, place, and time. Appears well-developed and  well-nourished.  Head: Normocephalic and atraumatic.  Right Ear: External ear normal.  Left Ear: External ear normal.  Nose: Nose normal.  Mouth/Throat: Oropharynx is clear and moist.  Eyes: Conjunctivae and EOM are normal. Pupils are equal, round, and reactive to light.  Bilat tm's with mild erythema.  Max sinus areas non tender.  Pharynx with mild erythema, no exudate' Neck: Normal range of motion. Neck supple. No JVD present. No tracheal deviation present.  Cardiovascular: Normal rate, regular rhythm, normal heart sounds and intact distal pulses.   Pulmonary/Chest: Effort normal and breath sounds without rales or wheezing  Abdominal: Soft. Bowel sounds are normal. NT. No HSM  Musculoskeletal: Normal range of motion. Exhibits no edema.  Lymphadenopathy:  Has no cervical adenopathy.  Neurological: Pt is alert and oriented to person, place, and time. Pt has normal reflexes. No cranial nerve deficit. Motor grossly intact Skin: Skin is warm and dry. 3 cm area  red/tedner/swelling to distal ant leg just above ankle without drainage  Psychiatric:  Has normal mood and affect. Behavior is normal.     Assessment & Plan:

## 2014-04-14 NOTE — Patient Instructions (Signed)
Please take all new medication as prescribed  - the cough medicine, and the antibiotic  OK to use the prescription for the compression stockings after the infection is improved  Please continue all other medications as before, and refills have been done if requested.  Please have the pharmacy call with any other refills you may need.  Please continue your efforts at being more active, low cholesterol diet, and weight control.  You are otherwise up to date with prevention measures today.  Please keep your appointments with your specialists as you may have planned  Please go to the LAB in the Basement (turn left off the elevator) for the tests to be done today  You will be contacted by phone if any changes need to be made immediately.  Otherwise, you will receive a letter about your results with an explanation, but please check with MyChart first.  Please remember to sign up for MyChart if you have not done so, as this will be important to you in the future with finding out test results, communicating by private email, and scheduling acute appointments online when needed.  You will be contacted regarding the referral for: podiatry  Please return in 6 months, or sooner if needed

## 2014-04-14 NOTE — Progress Notes (Signed)
Pre visit review using our clinic review tool, if applicable. No additional management support is needed unless otherwise documented below in the visit note. 

## 2014-04-15 ENCOUNTER — Telehealth: Payer: Self-pay | Admitting: Internal Medicine

## 2014-04-15 MED ORDER — ACETAMINOPHEN-CODEINE 300-30 MG PO TABS: 1.0000 | ORAL_TABLET | Freq: Four times a day (QID) | ORAL | Status: DC | PRN

## 2014-04-15 NOTE — Telephone Encounter (Signed)
Pt asking what she should do for her foot pain as she waits for podiatrist, pls advise.

## 2014-04-15 NOTE — Telephone Encounter (Signed)
Ok for tyelnol#3 - Done hardcopy to [facility]

## 2014-04-15 NOTE — Telephone Encounter (Signed)
Faxed hardcopy to [facility] [address] per patient request.  Patient contacted script sent in.

## 2014-04-19 NOTE — Assessment & Plan Note (Signed)
For bilateral compression stockings, leg elevation, low salt diet, wt loss ,  to f/u any worsening symptoms or concerns

## 2014-04-19 NOTE — Assessment & Plan Note (Signed)
Incidental, likely viral, for cough med prn,  to f/u any worsening symptoms or concerns

## 2014-04-19 NOTE — Assessment & Plan Note (Signed)

## 2014-04-19 NOTE — Assessment & Plan Note (Signed)
Mild to mod, for antibx course - septra ds,  to f/u any worsening symptoms or concerns 

## 2014-04-22 ENCOUNTER — Ambulatory Visit: Payer: Self-pay | Admitting: Podiatry

## 2014-04-23 ENCOUNTER — Telehealth: Payer: Self-pay | Admitting: *Deleted

## 2014-04-23 NOTE — Telephone Encounter (Signed)
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 Can she take otc medication to help? PreDisposition Call Doctor Nurse Assessment Nurse: Thad Ranger, RN, Langley Gauss Date/Time Eilene Ghazi Time): 04/22/2014 4:47:10 PM Confirm and document reason for call. If symptomatic, describe symptoms. 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 Can she take otc medication to help. States she has eaten pineapple all of her life but today is the first day she has ever had a reaction. States she also just started taking Sulfa approx 3 days ago for cough and sore throat. States she has a rash on her arms, legs, and feet. Denies diff breathing and swelling of face, lips, mouth/tongue. Has the patient traveled out of the country within the last 30 days? ---Not Applicable Does the patient require triage? ---Yes Related visit to physician within the last 2 weeks? ---Yes Does the PT have any chronic conditions? (i.e. diabetes, asthma, etc.) ---Yes List chronic conditions. ---CHF, DM Guidelines Guideline Title Affirmed Question Affirmed Notes Nurse Date/Time (Eastern Time) Rash - Widespread On Drugs Hives or itching Carmon, RN, Langley Gauss 04/22/2014 4:54:12 PM Disp. Time Eilene Ghazi Time) Disposition Final User 04/22/2014 5:08:26 PM Call Completed Thad Ranger, RN, Langley Gauss 04/22/2014 4:59:43 PM See  Physician within 24 Hours Yes Carmon, RN, Yevette Edwards Understands: Yes PLEASE NOTE: All timestamps contained within this report are represented as [place] Standard Time. CONFIDENTIALTY NOTICE: This fax transmission is intended only for the addressee. It contains information that is legally privileged, confidential or otherwise protected from use or disclosure. If you are not the intended recipient, you are strictly prohibited from reviewing, disclosing, copying using or disseminating any of this information or taking any action in reliance on or regarding this information. If you have received this fax in error, please notify us immediately by telephone so that we can arrange for its return to [place]. Phone: [phone], Toll-Free: [phone], Fax: [phone] Page: 2 of 2 Call Id: [id number] Disagree/Comply: Disagree Disagree/Comply Reason: Unable to find transportation Care Advice Given Per Guideline SEE PHYSICIAN WITHIN 24 HOURS: STOP THE MEDICATION: If the patient has severe hives or itching, suspect a drug allergy and stop the medicine until examined. ANTIHISTAMINE FOR HIVES OR SEVERE ITCHING: * Take an antihistamine by mouth. Diphenhydramine (OTC Benadryl) is a good choice. Adult dose is 25-50 mg. Take it up to 4 times a day. CAUTION - ANTIHISTAMINES: * Examples include diphenhydramine (Benadryl) and chlorpheniramine (Chlortrimeton, Chlor-tripolon) * May cause sleepiness. Do not drink alcohol, drive or operate dangerous machinery while taking antihistamines. COOL BATH FOR ITCHING: * For flare-ups of itching, you can try taking a cool bath for 10 minutes. Caution: avoid any chill. Optional: add 2 oz of baking soda to the tub. CALL BACK IF: * You become worse. CARE ADVICE given per Rash - Widespread on Drugs (Adult) guideline After Care Instructions Given Call Event Type User Date / Time Description Comments User: Romeo Apple, RN Date/Time Eilene Ghazi Time): 04/22/2014 5:08:14 PM Pt reports  she is unable  to get transportation to [facility] within 24 hrs, but will call for appt in the am. Advised she will need to f/u d/t allergic rx to the Sulfa and may need to be placed on different abx. Also states she needs a refill on Valsartan 320mg  po qd. States she has been out of the med x 3 days. Advised to call the pharm this eve for a loaner dose, and have her family member pick up the med for her this eve. Advised to call the [facility] in the am during reg bus hrs for the needed Rx refill. Verb understanding. Referrals GO TO FACILITY REFUSED

## 2014-04-27 ENCOUNTER — Telehealth: Payer: Self-pay | Admitting: Internal Medicine

## 2014-04-27 MED ORDER — VALSARTAN 320 MG PO TABS: 320.0000 mg | ORAL_TABLET | Freq: Every day | ORAL | Status: DC

## 2014-04-27 NOTE — Telephone Encounter (Signed)
Pt request refill for Valsartan 90 day supply to [facility] [address], phone [phone].

## 2014-04-27 NOTE — Telephone Encounter (Signed)
To [facility] for routine refills if appropriate

## 2014-04-27 NOTE — Telephone Encounter (Signed)
Not[facility].../[facility] 

## 2014-04-28 MED ORDER — VALSARTAN 320 MG PO TABS: 320.0000 mg | ORAL_TABLET | Freq: Every day | ORAL | Status: DC

## 2014-04-28 NOTE — Addendum Note (Signed)
Addended by: Sharon Seller B on: 04/28/2014 08:14 AM   Modules accepted: Orders

## 2014-05-01 ENCOUNTER — Ambulatory Visit (INDEPENDENT_AMBULATORY_CARE_PROVIDER_SITE_OTHER): Payer: Medicare Other

## 2014-05-01 ENCOUNTER — Encounter: Payer: Self-pay | Admitting: Podiatry

## 2014-05-01 ENCOUNTER — Ambulatory Visit (INDEPENDENT_AMBULATORY_CARE_PROVIDER_SITE_OTHER): Payer: Medicare Other | Admitting: Podiatry

## 2014-05-01 VITALS — BP 96/57 | HR 67 | Resp 16 | Ht 60.0 in | Wt 199.0 lb

## 2014-05-01 DIAGNOSIS — M79671 Pain in right foot: Secondary | ICD-10-CM

## 2014-05-01 DIAGNOSIS — M779 Enthesopathy, unspecified: Secondary | ICD-10-CM

## 2014-05-01 DIAGNOSIS — M79672 Pain in left foot: Secondary | ICD-10-CM

## 2014-05-01 DIAGNOSIS — B351 Tinea unguium: Secondary | ICD-10-CM

## 2014-05-01 NOTE — Progress Notes (Signed)
   Subjective:    Patient ID: Alyssa Ingram, female    DOB: [birth year], 68 y.o.   MRN: [id number]  HPI Comments: 68 year old female presents the office today with her son for diabetic risk assessment and chronic pain to her bilateral lower chemise. She states that she's had pain to her feet for over 30 years and she has pain particularly after sitting or sleeping for period of time. She states that the left greater than right. She also feels like she has pins and needles into her feet. She said no prior treatment. She states that she has chronic swelling to both her legs which has been ongoing for many years. She has recently seen her primary care physician for cellulitis and chronic venous insufficiency and she was placed on Bactrim for the left lower extremity cellulitis. She states that she only took a couple of the pills and then discontinue the medication on her own. She states that her symptoms have greatly improved the left leg. She states that she recently underwent a duplex of her leg to make sure she did not have a blood clot which was negative. She is diabetic and her blood sugars typically run between 117-130. She denies any intermittent claudication symptoms. No other complaints at this time.  Foot Pain Associated symptoms include headaches and numbness.      Review of Systems  Eyes: Positive for visual disturbance.  Cardiovascular: Positive for leg swelling.       Calf pain  Musculoskeletal: Positive for back pain.       Joint pain Muscle pain  Neurological: Positive for numbness and headaches.  All other systems reviewed and are negative.      Objective:   Physical Exam AAO x3, NAD DP/PT pulses 1/4 b/l, CRT less than 3 seconds Protective sensation intact with Simms Weinstein monofilament, vibratory sensation intact, Achilles tendon reflex intact. There is significant decrease in medial arch height upon weightbearing. Equinus bilaterally. There is mild interdigital  maceration without any open lesions at this time. Nails hypertrophic, dystrophic, elongated, brittle, discolored and subjectively the patient has pain over the nails with shoe gear. No swelling erythema or drainage from the nail sites. Bilateral lower extremity chronic edema with left greater than right. Patient states that this is normal for her and typically they become more swollen. No open lesions at this time. No areas of pinpoint bony tenderness or pain with vibratory sensation. There is no overlying erythema on palpation warmth of bilateral lower extremity's. No pain with calf compression, erythema, warmth.        Assessment and Plan:  68 year old female presents for symptomatic onychomycosis, diabetic risk assessment. -X-rays were obtained and reviewed with the patient. -Treatment options were discussed including alternatives, risks, complications. -Nail sharply debrided 10 without complications. -Patient would likely benefit from diabetic shoes. A prescription for shoes were given to the patient to [employer] to have custom molded insert/she is made. -Patient would likely benefit from compression stockings however we'll defer to PCP/cardiologist before starting this. -Currently there is no signs of infection to bilateral lower extremities. -Discussed the importance of daily foot inspection. Dry between the toes daily/after showering. -Follow-up in 3 months or sooner should any problems arise. In the meantime, call the office with any questions, concerns, change in symptoms. Follow up with PCP for other issues mentioned in the ROS.

## 2014-05-01 NOTE — Patient Instructions (Signed)
Diabetes and Foot Care Diabetes may cause you to have problems because of poor blood supply (circulation) to your feet and legs. This may cause the skin on your feet to become thinner, break easier, and heal more slowly. Your skin may become dry, and the skin may peel and crack. You may also have nerve damage in your legs and feet causing decreased feeling in them. You may not notice minor injuries to your feet that could lead to infections or more serious problems. Taking care of your feet is one of the most important things you can do for yourself.  HOME CARE INSTRUCTIONS  Wear shoes at all times, even in the house. Do not go barefoot. Bare feet are easily injured.  Check your feet daily for blisters, cuts, and redness. If you cannot see the bottom of your feet, use a mirror or ask someone for help.  Wash your feet with warm water (do not use hot water) and mild soap. Then pat your feet and the areas between your toes until they are completely dry. Do not soak your feet as this can dry your skin.  Apply a moisturizing lotion or petroleum jelly (that does not contain alcohol and is unscented) to the skin on your feet and to dry, brittle toenails. Do not apply lotion between your toes.  Trim your toenails straight across. Do not dig under them or around the cuticle. File the edges of your nails with an emery board or nail file.  Do not cut corns or calluses or try to remove them with medicine.  Wear clean socks or stockings every day. Make sure they are not too tight. Do not wear knee-high stockings since they may decrease blood flow to your legs.  Wear shoes that fit properly and have enough cushioning. To break in new shoes, wear them for just a few hours a day. This prevents you from injuring your feet. Always look in your shoes before you put them on to be sure there are no objects inside.  Do not cross your legs. This may decrease the blood flow to your feet.  If you find a minor scrape,  cut, or break in the skin on your feet, keep it and the skin around it clean and dry. These areas may be cleansed with mild soap and water. Do not cleanse the area with peroxide, alcohol, or iodine.  When you remove an adhesive bandage, be sure not to damage the skin around it.  If you have a wound, look at it several times a day to make sure it is healing.  Do not use heating pads or hot water bottles. They may burn your skin. If you have lost feeling in your feet or legs, you may not know it is happening until it is too late.  Make sure your health care provider performs a complete foot exam at least annually or more often if you have foot problems. Report any cuts, sores, or bruises to your health care provider immediately. SEEK MEDICAL CARE IF:   You have an injury that is not healing.  You have cuts or breaks in the skin.  You have an ingrown nail.  You notice redness on your legs or feet.  You feel burning or tingling in your legs or feet.  You have pain or cramps in your legs and feet.  Your legs or feet are numb.  Your feet always feel cold. SEEK IMMEDIATE MEDICAL CARE IF:   There is increasing redness,   swelling, or pain in or around a wound.  There is a red line that goes up your leg.  Pus is coming from a wound.  You develop a fever or as directed by your health care provider.  You notice a bad smell coming from an ulcer or wound. Document Released: 04/28/2000 Document Revised: 01/01/2013 Document Reviewed: 10/08/2012 Exit[employer] Patient Information 2015 [employer]. This information is not intended to replace advice given to you by your health care provider. Make sure you discuss any questions you have with your health care provider.  

## 2014-05-27 DIAGNOSIS — H26491 Other secondary cataract, right eye: Secondary | ICD-10-CM | POA: Diagnosis not present

## 2014-06-09 DIAGNOSIS — H26491 Other secondary cataract, right eye: Secondary | ICD-10-CM | POA: Diagnosis not present

## 2014-06-12 ENCOUNTER — Telehealth: Payer: Self-pay | Admitting: Internal Medicine

## 2014-06-12 MED ORDER — ONETOUCH DELICA LANCETS 33G MISC: 1.0000 | Freq: Every day | Status: DC

## 2014-06-12 MED ORDER — GLUCOSE BLOOD VI STRP: 1.0000 | ORAL_STRIP | Freq: Two times a day (BID) | Status: DC

## 2014-06-12 MED ORDER — ONETOUCH VERIO IQ SYSTEM W/DEVICE KIT: PACK | Status: DC

## 2014-06-12 NOTE — Telephone Encounter (Signed)
Sent diabetic monitor & supplies to [facility].../lmb

## 2014-06-12 NOTE — Telephone Encounter (Signed)
Pt called requesting meter, and supplies sent over to [facility] on [address]    One Touch Verio IQ Lantus  Test strips

## 2014-06-15 ENCOUNTER — Telehealth: Payer: Self-pay | Admitting: Internal Medicine

## 2014-06-15 MED ORDER — METOPROLOL TARTRATE 50 MG PO TABS: 50.0000 mg | ORAL_TABLET | Freq: Two times a day (BID) | ORAL | Status: DC

## 2014-06-15 NOTE — Telephone Encounter (Signed)
Pt called in and is needing a refill on her   metoprolol (LOPRESSOR) 50 MG tablet [[id number]]    [facility] [address]

## 2014-07-02 ENCOUNTER — Telehealth: Payer: Self-pay | Admitting: Internal Medicine

## 2014-07-03 ENCOUNTER — Ambulatory Visit: Payer: Self-pay | Admitting: Internal Medicine

## 2014-07-07 ENCOUNTER — Other Ambulatory Visit (INDEPENDENT_AMBULATORY_CARE_PROVIDER_SITE_OTHER): Payer: Medicare Other

## 2014-07-07 ENCOUNTER — Ambulatory Visit (INDEPENDENT_AMBULATORY_CARE_PROVIDER_SITE_OTHER): Payer: Medicare Other | Admitting: Internal Medicine

## 2014-07-07 ENCOUNTER — Encounter: Payer: Self-pay | Admitting: Internal Medicine

## 2014-07-07 VITALS — BP 124/86 | HR 64 | Temp 98.0°F | Resp 18 | Ht 60.0 in | Wt 211.0 lb

## 2014-07-07 DIAGNOSIS — G8929 Other chronic pain: Secondary | ICD-10-CM

## 2014-07-07 DIAGNOSIS — E119 Type 2 diabetes mellitus without complications: Secondary | ICD-10-CM

## 2014-07-07 DIAGNOSIS — Z0000 Encounter for general adult medical examination without abnormal findings: Secondary | ICD-10-CM

## 2014-07-07 DIAGNOSIS — M25561 Pain in right knee: Secondary | ICD-10-CM

## 2014-07-07 DIAGNOSIS — I10 Essential (primary) hypertension: Secondary | ICD-10-CM | POA: Diagnosis not present

## 2014-07-07 DIAGNOSIS — J309 Allergic rhinitis, unspecified: Secondary | ICD-10-CM

## 2014-07-07 LAB — LIPID PANEL
CHOLESTEROL: 197 mg/dL (ref 0–200)
HDL: 49.4 mg/dL (ref >39.00)
LDL Cholesterol: 124 mg/dL — ABNORMAL HIGH (ref 0–99)
NonHDL: 147.6
Total CHOL/HDL Ratio: 4
Triglycerides: 117 mg/dL (ref 0.0–149.0)
VLDL: 23.4 mg/dL (ref 0.0–40.0)

## 2014-07-07 LAB — CBC WITH DIFFERENTIAL/PLATELET
BASOS PCT: 0.4 % (ref 0.0–3.0)
Basophils Absolute: 0 10*3/uL (ref 0.0–0.1)
EOS ABS: 0.2 10*3/uL (ref 0.0–0.7)
Eosinophils Relative: 4.1 % (ref 0.0–5.0)
HCT: 32 % — ABNORMAL LOW (ref 36.0–46.0)
Hemoglobin: 10.4 g/dL — ABNORMAL LOW (ref 12.0–15.0)
Lymphocytes Relative: 28.7 % (ref 12.0–46.0)
Lymphs Abs: 1.6 10*3/uL (ref 0.7–4.0)
MCHC: 32.5 g/dL (ref 30.0–36.0)
MCV: 81 fl (ref 78.0–100.0)
MONO ABS: 0.5 10*3/uL (ref 0.1–1.0)
Monocytes Relative: 8.8 % (ref 3.0–12.0)
NEUTROS PCT: 58 % (ref 43.0–77.0)
Neutro Abs: 3.3 10*3/uL (ref 1.4–7.7)
PLATELETS: 269 10*3/uL (ref 150.0–400.0)
RBC: 3.95 Mil/uL (ref 3.87–5.11)
RDW: 17.5 % — AB (ref 11.5–15.5)
WBC: 5.7 10*3/uL (ref 4.0–10.5)

## 2014-07-07 LAB — URINALYSIS, ROUTINE W REFLEX MICROSCOPIC
Bilirubin Urine: NEGATIVE
HGB URINE DIPSTICK: NEGATIVE
Ketones, ur: NEGATIVE
Leukocytes, UA: NEGATIVE
NITRITE: NEGATIVE
RBC / HPF: NONE SEEN (ref 0–?)
SPECIFIC GRAVITY, URINE: 1.015 (ref 1.000–1.030)
Total Protein, Urine: NEGATIVE
Urine Glucose: NEGATIVE
Urobilinogen, UA: 0.2 (ref 0.0–1.0)
WBC UA: NONE SEEN (ref 0–?)
pH: 6 (ref 5.0–8.0)

## 2014-07-07 LAB — BASIC METABOLIC PANEL
BUN: 35 mg/dL — AB (ref 6–23)
CHLORIDE: 104 meq/L (ref 96–112)
CO2: 32 meq/L (ref 19–32)
Calcium: 9.6 mg/dL (ref 8.4–10.5)
Creatinine, Ser: 0.98 mg/dL (ref 0.40–1.20)
GFR: 59.83 mL/min — ABNORMAL LOW (ref >60.00)
GLUCOSE: 97 mg/dL (ref 70–99)
POTASSIUM: 4.3 meq/L (ref 3.5–5.1)
Sodium: 141 mEq/L (ref 135–145)

## 2014-07-07 LAB — HEPATIC FUNCTION PANEL
ALT: 7 U/L (ref 0–35)
AST: 13 U/L (ref 0–37)
Albumin: 3.9 g/dL (ref 3.5–5.2)
Alkaline Phosphatase: 57 U/L (ref 39–117)
BILIRUBIN TOTAL: 0.4 mg/dL (ref 0.2–1.2)
Bilirubin, Direct: 0.1 mg/dL (ref 0.0–0.3)
Total Protein: 7.8 g/dL (ref 6.0–8.3)

## 2014-07-07 LAB — MICROALBUMIN / CREATININE URINE RATIO
Creatinine,U: 104.2 mg/dL
MICROALB/CREAT RATIO: 0.7 mg/g (ref 0.0–30.0)
Microalb, Ur: <0.7 mg/dL (ref 0.0–1.9)

## 2014-07-07 LAB — TSH: TSH: 0.43 u[IU]/mL (ref 0.35–4.50)

## 2014-07-07 LAB — HEMOGLOBIN A1C: Hgb A1c MFr Bld: 6.5 % (ref 4.6–6.5)

## 2014-07-07 MED ORDER — MECLIZINE HCL 25 MG PO TABS: 25.0000 mg | ORAL_TABLET | Freq: Three times a day (TID) | ORAL | Status: DC

## 2014-07-07 MED ORDER — FLUTICASONE PROPIONATE 50 MCG/ACT NA SUSP: 2.0000 | Freq: Every day | NASAL | Status: DC

## 2014-07-07 MED ORDER — CETIRIZINE HCL 10 MG PO TABS: 10.0000 mg | ORAL_TABLET | Freq: Every day | ORAL | Status: DC

## 2014-07-07 NOTE — Patient Instructions (Signed)
Please take all new medication as prescribed - the zyrtec and flonase  Please continue all other medications as before, and refills have been done if requested.  Please have the pharmacy call with any other refills you may need.  Please continue your efforts at being more active, low cholesterol diet, and weight control.  You are otherwise up to date with prevention measures today.  Please keep your appointments with your specialists as you may have planned  You will be contacted regarding the referral for: orthopedic and Diabetes dietary education  Please go to the LAB in the Basement (turn left off the elevator) for the tests to be done today  You will be contacted by phone if any changes need to be made immediately.  Otherwise, you will receive a letter about your results with an explanation, but please check with MyChart first.  Please remember to sign up for MyChart if you have not done so, as this will be important to you in the future with finding out test results, communicating by private email, and scheduling acute appointments online when needed.  Please return in 6 months, or sooner if needed, with Lab testing done 3-5 days before

## 2014-07-07 NOTE — Progress Notes (Signed)
Subjective:    Patient ID: Alyssa Ingram, female    DOB: 02-Feb-1946, 69 y.o.   MRN: [id number]  HPI  Here for wellness and f/u;  Overall doing ok;  Pt denies CP, worsening SOB, DOE, wheezing, orthopnea, PND, worsening LE edema, palpitations, dizziness or syncope.  Pt denies neurological change such as new headache, facial or extremity weakness.  Pt denies polydipsia, polyuria, or low sugar symptoms. Pt states overall good compliance with treatment and medications, good tolerability, and has been trying to follow lower cholesterol diet.  Pt denies worsening depressive symptoms, suicidal ideation or panic. No fever, night sweats, wt loss, loss of appetite, or other constitutional symptoms.  Pt states good ability with ADL's, has low fall risk, home safety reviewed and adequate, no other significant changes in hearing or vision, and only occasionally active with exercise, mostly due to ongoing gradually worsening right knee pain. Does have several wks ongoing nasal allergy symptoms with clearish congestion, itch and sneezing, without fever, pain, ST, cough, swelling or wheezing.   Past Medical History  Diagnosis Date  . Arthritis   . Asthma   . Cancer   . GERD (gastroesophageal reflux disease)   . Hypertension   . Kidney stones   . Chronic diastolic heart failure   . Hyperlipidemia   . HOCM (hypertrophic obstructive cardiomyopathy)     Echo 12/19/11: Severe LVH, EF 55-65%, dynamic obstruction, wall motion normal, grade 1 diastolic dysfunction, systolic anterior motion of the mitral valve with mild MS and mild MR, mild LAE, PASP 34  . Abnormal MRI, spine 11/2011    ?discitis L5-S1, s/p CT bx 12/12/11  . H/O cardiovascular stress test     Nuclear study in 2011 normal perfusion  . Obesity   . Type II or unspecified type diabetes mellitus without mention of complication, uncontrolled 01/31/2013  . Allergic rhinitis, cause unspecified 01/31/2013   Past Surgical History  Procedure Laterality Date  .  Abdominal hysterectomy    . Rotater cuff      right, 2005  . Joint replacement      right knee 2006  . Nephrolithiasis      reports that she has never smoked. She has never used smokeless tobacco. She reports that she does not drink alcohol or use illicit drugs. family history includes Alcohol abuse in her other; Arthritis in her other and other; Cancer in her other; Diabetes in her other and other; Heart disease in her father, other, and other; Hypertension in her other and other; Kidney disease in her other; Mental illness in her other; Sudden death in her other. There is no history of Colon cancer. Allergies  Allergen Reactions  . Other Hives    Unknown drug   Current Outpatient Prescriptions on File Prior to Visit  Medication Sig Dispense Refill  . Acetaminophen-Codeine (TYLENOL/CODEINE #3) 300-30 MG per tablet Take 1 tablet by mouth every 6 (six) hours as needed for pain. 60 tablet 0  . albuterol (VENTOLIN HFA) 108 (90 BASE) MCG/ACT inhaler Inhale 2 puffs into the lungs 2 (two) times daily. 3 Inhaler 3  . aspirin 81 MG tablet Take 81 mg by mouth. Take 2 pills daily    . Blood Glucose Monitoring Suppl (ONETOUCH VERIO IQ SYSTEM) W/DEVICE KIT Use to check blood sugars daily E11.9 1 kit 0  . budesonide-formoterol (SYMBICORT) 160-4.5 MCG/ACT inhaler Inhale 2 puffs into the lungs 2 (two) times daily. 1 Inhaler 11  . cyclobenzaprine (FLEXERIL) 10 MG tablet Take 1 tablet (10 mg  total) by mouth 3 (three) times daily as needed for muscle spasms. 90 tablet 6  . furosemide (LASIX) 40 MG tablet TAKE ONE & ONE-HALF TABLETS BY MOUTH TWICE DAILY 90 tablet 3  . glucose blood (ONE TOUCH ULTRA TEST) test strip 1 each by Other route daily. Use as instructed 30 each 11  . glucose blood test strip 1 each by Other route 2 (two) times daily. Use to check blood sugars twice a day E11.9 60 each 11  . HYDROcodone-homatropine (HYCODAN) 5-1.5 MG/5ML syrup Take 5 mLs by mouth every 6 (six) hours as needed for  cough. 180 mL 0  . lovastatin (MEVACOR) 20 MG tablet Take 40 mg by mouth at bedtime.    . metFORMIN (GLUCOPHAGE) 500 MG tablet TAKE ONE TABLET BY MOUTH TWICE DAILY WITH MEALS 180 tablet 3  . metoprolol (LOPRESSOR) 50 MG tablet Take 1 tablet (50 mg total) by mouth 2 (two) times daily. 180 tablet 3  . MOVIPREP 100 G SOLR Take 1 kit (200 g total) by mouth once. 1 kit 0  . ONETOUCH DELICA LANCETS 29U MISC 1 each by Other route daily. Use as directed to test blood sugar daily dx 250.00 60 each 11  . pantoprazole (PROTONIX) 40 MG tablet Take 1 tablet (40 mg total) by mouth daily. 90 tablet 3  . valsartan (DIOVAN) 320 MG tablet Take 1 tablet (320 mg total) by mouth daily. 90 tablet 3   No current facility-administered medications on file prior to visit.   Review of Systems Constitutional: Negative for increased diaphoresis, other activity, appetite or other siginficant weight change  HENT: Negative for worsening hearing loss, ear pain, facial swelling, mouth sores and neck stiffness.   Eyes: Negative for other worsening pain, redness or visual disturbance.  Respiratory: Negative for shortness of breath and wheezing.   Cardiovascular: Negative for chest pain and palpitations.  Gastrointestinal: Negative for diarrhea, blood in stool, abdominal distention or other pain Genitourinary: Negative for hematuria, flank pain or change in urine volume.  Musculoskeletal: Negative for myalgias or other joint complaints.  Skin: Negative for color change and wound.  Neurological: Negative for syncope and numbness. other than noted Hematological: Negative for adenopathy. or other swelling Psychiatric/Behavioral: Negative for hallucinations, self-injury, decreased concentration or other worsening agitation.      Objective:   Physical Exam BP 124/86 mmHg  Pulse 64  Temp(Src) 98 F (36.7 C) (Oral)  Resp 18  Ht 5' (1.524 m)  Wt 211 lb 0.6 oz (95.727 kg)  BMI 41.22 kg/m2  SpO2 97% VS noted,    Constitutional: Pt is oriented to person, place, and time. Appears well-developed and well-nourished.  Head: Normocephalic and atraumatic.  Right Ear: External ear normal.  Left Ear: External ear normal.  Nose: Nose normal.  Mouth/Throat: Oropharynx is clear and moist.  Eyes: Conjunctivae and EOM are normal. Pupils are equal, round, and reactive to light.  Bilat tm's with mild erythema.  Max sinus areas non tender.  Pharynx with mild erythema, no exudate Neck: Normal range of motion. Neck supple. No JVD present. No tracheal deviation present.  Cardiovascular: Normal rate, regular rhythm, normal heart sounds and intact distal pulses.   Pulmonary/Chest: Effort normal and breath sounds without rales or wheezing  Abdominal: Soft. Bowel sounds are normal. NT. No HSM  Musculoskeletal: Normal range of motion. Exhibits no edema.  Lymphadenopathy:  Has no cervical adenopathy.  Neurological: Pt is alert and oriented to person, place, and time. Pt has normal reflexes. No cranial nerve  deficit. Motor grossly intact Skin: Skin is warm and dry. No rash noted.  Psychiatric:  Has normal mood and affect. Behavior is normal.  Right knee with bony deg changes, no effusion, NT    Assessment & Plan:

## 2014-07-07 NOTE — Progress Notes (Signed)
Pre visit review using our clinic review tool, if applicable. No additional management support is needed unless otherwise documented below in the visit note. 

## 2014-07-08 ENCOUNTER — Other Ambulatory Visit (INDEPENDENT_AMBULATORY_CARE_PROVIDER_SITE_OTHER): Payer: Medicare Other

## 2014-07-08 DIAGNOSIS — D649 Anemia, unspecified: Secondary | ICD-10-CM

## 2014-07-08 LAB — IBC PANEL
IRON: 63 ug/dL (ref 42–145)
SATURATION RATIOS: 19.1 % — AB (ref 20.0–50.0)
TRANSFERRIN: 235 mg/dL (ref 212.0–360.0)

## 2014-07-09 ENCOUNTER — Encounter: Payer: Self-pay | Admitting: Internal Medicine

## 2014-07-17 NOTE — Assessment & Plan Note (Signed)
For zyrtec/flonase asd

## 2014-07-17 NOTE — Assessment & Plan Note (Signed)
Gradually worening, for sport med referral, pain control,  to f/u any worsening symptoms or concerns

## 2014-07-17 NOTE — Assessment & Plan Note (Signed)

## 2014-07-17 NOTE — Assessment & Plan Note (Signed)
Refer Dm education, o/w stable overall by history and exam, recent data reviewed with pt, and pt to continue medical treatment as before,  to f/u any worsening symptoms or concerns Lab Results  Component Value Date   HGBA1C 6.5 07/07/2014

## 2014-07-31 ENCOUNTER — Ambulatory Visit: Payer: Medicare Other | Admitting: Podiatry

## 2014-08-06 ENCOUNTER — Encounter: Payer: Self-pay | Admitting: Family Medicine

## 2014-08-06 ENCOUNTER — Ambulatory Visit (INDEPENDENT_AMBULATORY_CARE_PROVIDER_SITE_OTHER)
Admission: RE | Admit: 2014-08-06 | Discharge: 2014-08-06 | Disposition: A | Discharge status: Home / Self Care | Payer: Medicare Other | Source: Ambulatory Visit | Attending: Family Medicine | Admitting: Family Medicine

## 2014-08-06 ENCOUNTER — Ambulatory Visit (INDEPENDENT_AMBULATORY_CARE_PROVIDER_SITE_OTHER): Payer: Medicare Other | Admitting: Family Medicine

## 2014-08-06 VITALS — BP 136/84 | HR 66 | Wt 220.0 lb

## 2014-08-06 DIAGNOSIS — M129 Arthropathy, unspecified: Secondary | ICD-10-CM

## 2014-08-06 DIAGNOSIS — M25561 Pain in right knee: Secondary | ICD-10-CM

## 2014-08-06 DIAGNOSIS — Z96651 Presence of right artificial knee joint: Secondary | ICD-10-CM

## 2014-08-06 DIAGNOSIS — M1712 Unilateral primary osteoarthritis, left knee: Secondary | ICD-10-CM | POA: Diagnosis not present

## 2014-08-06 DIAGNOSIS — IMO0002 Reserved for concepts with insufficient information to code with codable children: Secondary | ICD-10-CM

## 2014-08-06 DIAGNOSIS — M25562 Pain in left knee: Secondary | ICD-10-CM

## 2014-08-06 DIAGNOSIS — Z471 Aftercare following joint replacement surgery: Secondary | ICD-10-CM | POA: Diagnosis not present

## 2014-08-06 NOTE — Progress Notes (Signed)
Pre visit review using our clinic review tool, if applicable. No additional management support is needed unless otherwise documented below in the visit note. 

## 2014-08-06 NOTE — Progress Notes (Signed)
[name] [address], [place] [address] Phone: [phone] Subjective:    I'm seeing this patient by the request  of:  Cathlean Cower, MD   CC: bilateral knee pain  CBJ:[id number] Alyssa Ingram is a 69 y.o. female coming in with complaint of right knee pain.patient does have past medical history significant for a knee replacement in 2006. Patient states over the course last 3 years though she has started having pain especially when she puts her knee down on the ground. Patient states certain activities such as going up or downstairs give her some difficulty in this right knee as well. Sometimes feels like there is a clunking sensation. Denies that the knee is ever giving out on her.  Patient is also complaining of left knee pain. Has known arthritis she states in this left knee. Patient states the pain is mostly on the medial and lateral aspects of the knee. Patient thinks she is favoring her right knee saw has been putting more pressure on this knee. Denies any radiation the pain but does have significant swelling of the lower extremities bilaterally and has had cellulitis multiple times. Patient rates the severity of pain a 6 out of 10. Denies any nighttime awakening. Patient sometimes does need pain medication to help her.     Past medical history, social, surgical and family history all reviewed in electronic medical record.   Review of Systems: No headache, visual changes, nausea, vomiting, diarrhea, constipation, dizziness, abdominal pain, skin rash, fevers, chills, night sweats, weight loss, swollen lymph nodes, body aches, joint swelling, muscle aches, chest pain, shortness of breath, mood changes.   Objective Blood pressure 136/84, pulse 66, weight 220 lb (99.791 kg).  General: No apparent distress alert and oriented x3 mood and affect normal, dressed appropriately. obese HEENT: Pupils equal, extraocular movements intact  Respiratory:  Patient's speak in full sentences and does not appear short of breath  Cardiovascular: 2+ lower extremity edemaSymmetric, non tender, no erythema  Skin: Warm dry intact with no signs of infection or rash on extremities or on axial skeleton.  Abdomen: Soft nontender  Neuro: Cranial nerves II through XII are intact, neurovascularly intact in all extremities with 2+ DTRs and 2+ pulses.  Lymph: 2+ pitting edema of the lower Jimmy's bilaterally Gait Antalgic gait.  MSK:  Non tender with full range of motion and good stability and symmetric strength and tone of shoulders, elbows, wrist, hip, and ankles bilaterally.  Right knee exam shows the patient's incision is well-healed with no signs of infection. Patient has almost full extension lacking the last 5 and flexion of 95. Patient does have somewhat of a positive clunk. Minimally tender to palpation over the anterior aspect of the knee.  Left knee shows patient does have severe crepitus with range of motion is lacking the last 10 of extension as well as the last 10 of flexion. Patient is neurovascularly intact distally. Tender to palpation over the medial joint line  procedure After informed written and verbal consent, patient was seated on exam table. Left knee was prepped with alcohol swab and utilizing anterolateral approach, patient's left knee space was injected with 4:1  marcaine 0.5%: Kenalog 40mg /dL. Patient tolerated the procedure well without immediate complications.  Procedure 60737; 15 minutes spent for Therapeutic exercises as stated in above notes.  This included exercises focusing on stretching, strengthening, with significant focus on eccentric aspects.  We discussed range of motion exercises as well as office strengthening of  the VMO as well as hamstring extensions. Proper technique shown and discussed handout in great detail with ATC.  All questions were discussed and answered.      Impression and Recommendations:     This case  required medical decision making of moderate complexity.

## 2014-08-06 NOTE — Assessment & Plan Note (Signed)
She was given an injection today. Patient did work with her trainer exercises in greater detail. Discussed icing as well as over-the-counter natural supplementation but may be beneficial. Patient does fail this I do think that this could supplementation could be a possibility. Patient was somewhat of a difficult injection in ultrasound guidance may be necessary secondary to her edema as well as obesity. Patient follow-up again in 3-4 weeks for further evaluation and treatment. X-rays ordered.

## 2014-08-06 NOTE — Patient Instructions (Addendum)
Good to meet you Ice 20 minutes 2 times daily. Usually after activity and before bed. Exercises 3 times a week.  Take tylenol 650 mg three times a day is the best evidence based medicine we have for arthritis.  Glucosamine sulfate 1500mg  twice a day is a supplement that has been shown to help moderate to severe arthritis. Vitamin D 2000 IU daily Fish oil 2 grams daily.  Tumeric 500mg  twice daily.  Capsaicin topically up to four times a day may also help with pain. Cortisone injections are an option if these interventions do not seem to make a difference or need more relief.  If cortisone injections do not help, there are different types of shots that may help but they take longer to take effect.  We can discuss this at follow up.  It's important that you continue to stay active. Controlling your weight is important.  Consider physical therapy to strengthen muscles around the joint that hurts to take pressure off of the joint itself. Water aerobics and recumbent with low resistance are the best two types of exercise for arthritis. Dr. Mayer Camel office will be calling you.  Come back and see me in 3 weeks.

## 2014-08-06 NOTE — Assessment & Plan Note (Signed)
With patient's knee pain and history of replacement in 2006 patient will be referred to an orthopedic surgeon for further evaluation. We will get x-rays today to see if there is any type of loosening.discussed possible bracing to give her more security which patient declined. Patient though was given some exercises by athletic trainer today.

## 2014-08-13 ENCOUNTER — Other Ambulatory Visit: Payer: Self-pay | Admitting: Internal Medicine

## 2014-08-25 ENCOUNTER — Ambulatory Visit: Payer: Medicare Other | Admitting: Family Medicine

## 2014-08-27 ENCOUNTER — Encounter: Payer: Self-pay | Admitting: Family Medicine

## 2014-08-27 ENCOUNTER — Ambulatory Visit (INDEPENDENT_AMBULATORY_CARE_PROVIDER_SITE_OTHER): Payer: Medicare Other | Admitting: Family Medicine

## 2014-08-27 VITALS — BP 132/84 | HR 77 | Ht 60.0 in | Wt 211.0 lb

## 2014-08-27 DIAGNOSIS — M25561 Pain in right knee: Secondary | ICD-10-CM | POA: Diagnosis not present

## 2014-08-27 DIAGNOSIS — G8929 Other chronic pain: Secondary | ICD-10-CM | POA: Diagnosis not present

## 2014-08-27 DIAGNOSIS — M129 Arthropathy, unspecified: Secondary | ICD-10-CM | POA: Diagnosis not present

## 2014-08-27 DIAGNOSIS — IMO0002 Reserved for concepts with insufficient information to code with codable children: Secondary | ICD-10-CM

## 2014-08-27 NOTE — Patient Instructions (Signed)
You are doing great!!! Ice is your friend Consider signing up for my chart B12 1033mcg daily B6 200mg  daily Iron 325mg  3 times a week Conitnue the other vitamins See me again in 6-8 weeks if you need another injection or see me when you need me.

## 2014-08-27 NOTE — Progress Notes (Signed)
Pre visit review using our clinic review tool, if applicable. No additional management support is needed unless otherwise documented below in the visit note. 

## 2014-08-27 NOTE — Progress Notes (Signed)
  [name] [address], [place] [address] Phone: [phone] Subjective:    I'm seeing this patient by the request  of:  Cathlean Cower, MD   CC: bilateral knee pain  [id number] Alyssa Ingram is a 69 y.o. female coming in with complaint of right knee pain.patient does have past medical history significant for a knee replacement in 2006. She was referred to an orthopedic surgeon for further evaluation. She has not had an appointment yet.  Patient is also complaining of left knee pain. 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 Patient was given a steroid injection at last exam as well as given a brace, home exercises and icing protocol. Patient states she is doing approximate 65-75% better. Patient states that she can do more of her daily activities without pain. Still in With the aid of a cane the more secondary to the pain of the right knee. Patient states that there is some mild radiation down the lateral aspect the leg but this seems to be minimal. Overall very happy with the results.     Past medical history, social, surgical and family history all reviewed in electronic medical record.   Review of Systems: No headache, visual changes, nausea, vomiting, diarrhea, constipation, dizziness, abdominal pain, skin rash, fevers, chills, night sweats, weight loss, swollen lymph nodes, body aches, joint swelling, muscle aches, chest pain, shortness of breath, mood changes.   Objective Blood pressure 132/84, pulse 77, weight 211 lb (95.709 kg), SpO2 96 %.  General: No apparent distress alert and oriented x3 mood and affect normal, dressed appropriately. obese HEENT: Pupils equal, extraocular movements intact  Respiratory: Patient's speak in full sentences and does not appear short of breath  Cardiovascular: 2+ lower extremity edemaSymmetric, non tender, no erythema  Skin: Warm dry intact with no signs of  infection or rash on extremities or on axial skeleton.  Abdomen: Soft nontender  Neuro: Cranial nerves II through XII are intact, neurovascularly intact in all extremities with 2+ DTRs and 2+ pulses.  Lymph: 2+ pitting edema of the lower extremity bilaterally Gait Antalgic gait.  MSK:  Non tender with full range of motion and good stability and symmetric strength and tone of shoulders, elbows, wrist, hip, and ankles bilaterally.  Right knee exam shows the patient's incision is well-healed with no signs of infection. Patient has almost full extension lacking the last 5 and flexion of 95. Patient does have somewhat of a positive clunk.  No change from previous exam.   Left knee shows patient does have severe crepitus with range of motion is lacking the last 10 of extension as well as the last 10 of flexion. Patient is neurovascularly intact distally. Non tender over the knee. Improved.       Impression and Recommendations:     This case required medical decision making of moderate complexity.

## 2014-08-27 NOTE — Assessment & Plan Note (Signed)
Patient is doing much better after the injection. Encourage patient to continue with the conservative therapy. We discussed again the possibility of formal physical therapy but patient declined due to financial as well as unable to get on a regular basis. Patient will continue with conservative therapy and then will see me again in 6-8 weeks for further evaluation.  Spent  25 minutes with patient face-to-face and had greater than 50% of counseling including as described above in assessment and plan.

## 2014-08-27 NOTE — Assessment & Plan Note (Signed)
History of replacement and will be sent to orthopedic surgeon for further evaluation.

## 2014-08-28 ENCOUNTER — Telehealth: Payer: Self-pay | Admitting: Internal Medicine

## 2014-08-28 MED ORDER — PANTOPRAZOLE SODIUM 40 MG PO TBEC: 40.0000 mg | DELAYED_RELEASE_TABLET | Freq: Every day | ORAL | Status: DC

## 2014-08-28 NOTE — Telephone Encounter (Signed)
Refill sent to [facility].../lmb 

## 2014-08-28 NOTE — Telephone Encounter (Signed)
Pt called in and needs refill for pantoprazole (PROTONIX) 40 MG tablet [[id number]]   [facility] [address]

## 2014-09-17 DIAGNOSIS — T8484XA Pain due to internal orthopedic prosthetic devices, implants and grafts, initial encounter: Secondary | ICD-10-CM | POA: Diagnosis not present

## 2014-10-07 ENCOUNTER — Telehealth: Payer: Self-pay | Admitting: Internal Medicine

## 2014-10-07 MED ORDER — ALBUTEROL SULFATE HFA 108 (90 BASE) MCG/ACT IN AERS: 2.0000 | INHALATION_SPRAY | Freq: Two times a day (BID) | RESPIRATORY_TRACT | Status: DC

## 2014-10-07 NOTE — Telephone Encounter (Signed)
Patient requesting refill for albuterol (VENTOLIN HFA) 108 (90 BASE) MCG/ACT inhaler [[id number]] . Pharmacy is [facility] [address].

## 2014-10-07 NOTE — Telephone Encounter (Signed)
Refill [facility] to [facility]../lmb 

## 2014-10-17 ENCOUNTER — Other Ambulatory Visit: Payer: Self-pay | Admitting: Internal Medicine

## 2014-11-05 ENCOUNTER — Other Ambulatory Visit: Payer: Self-pay | Admitting: Internal Medicine

## 2014-11-12 ENCOUNTER — Telehealth: Payer: Self-pay | Admitting: Internal Medicine

## 2014-11-12 NOTE — Telephone Encounter (Signed)
lmovm for pt to return call.  She should be taking B6 200mg , B12 159mcg, & iron 325mg .

## 2014-11-12 NOTE — Telephone Encounter (Signed)
Patient wanted to verify the the amount on vitamins she is taking B6 & B12 are 200mg  each Iron 325mg  Please advise patient at [phone]

## 2015-01-05 ENCOUNTER — Ambulatory Visit: Payer: Medicare Other | Admitting: Internal Medicine

## 2015-01-12 ENCOUNTER — Ambulatory Visit: Payer: Medicare Other | Admitting: Internal Medicine

## 2015-01-20 ENCOUNTER — Ambulatory Visit: Payer: Medicare Other | Admitting: Internal Medicine

## 2015-01-22 ENCOUNTER — Other Ambulatory Visit: Payer: Self-pay

## 2015-01-22 MED ORDER — GLUCOSE BLOOD VI STRP: 1.0000 | ORAL_STRIP | Freq: Three times a day (TID) | Status: DC

## 2015-01-26 ENCOUNTER — Other Ambulatory Visit (INDEPENDENT_AMBULATORY_CARE_PROVIDER_SITE_OTHER): Payer: Medicare Other

## 2015-01-26 ENCOUNTER — Encounter: Payer: Self-pay | Admitting: Internal Medicine

## 2015-01-26 ENCOUNTER — Ambulatory Visit (INDEPENDENT_AMBULATORY_CARE_PROVIDER_SITE_OTHER): Payer: Medicare Other | Admitting: Internal Medicine

## 2015-01-26 VITALS — BP 130/76 | HR 63 | Temp 98.2°F | Ht 60.0 in | Wt 217.0 lb

## 2015-01-26 DIAGNOSIS — I421 Obstructive hypertrophic cardiomyopathy: Secondary | ICD-10-CM | POA: Diagnosis not present

## 2015-01-26 DIAGNOSIS — M79671 Pain in right foot: Secondary | ICD-10-CM

## 2015-01-26 DIAGNOSIS — M79672 Pain in left foot: Secondary | ICD-10-CM

## 2015-01-26 DIAGNOSIS — Z23 Encounter for immunization: Secondary | ICD-10-CM

## 2015-01-26 DIAGNOSIS — J4530 Mild persistent asthma, uncomplicated: Secondary | ICD-10-CM

## 2015-01-26 DIAGNOSIS — E119 Type 2 diabetes mellitus without complications: Secondary | ICD-10-CM | POA: Diagnosis not present

## 2015-01-26 DIAGNOSIS — I10 Essential (primary) hypertension: Secondary | ICD-10-CM

## 2015-01-26 DIAGNOSIS — Z0189 Encounter for other specified special examinations: Secondary | ICD-10-CM

## 2015-01-26 DIAGNOSIS — Z0000 Encounter for general adult medical examination without abnormal findings: Secondary | ICD-10-CM

## 2015-01-26 LAB — LIPID PANEL
Cholesterol: 227 mg/dL — ABNORMAL HIGH (ref 0–200)
HDL: 54.3 mg/dL (ref >39.00)
LDL Cholesterol: 144 mg/dL — ABNORMAL HIGH (ref 0–99)
NonHDL: 172.76
TRIGLYCERIDES: 144 mg/dL (ref 0.0–149.0)
Total CHOL/HDL Ratio: 4
VLDL: 28.8 mg/dL (ref 0.0–40.0)

## 2015-01-26 LAB — HEMOGLOBIN A1C: HEMOGLOBIN A1C: 6 % (ref 4.6–6.5)

## 2015-01-26 LAB — BASIC METABOLIC PANEL
BUN: 34 mg/dL — AB (ref 6–23)
CHLORIDE: 101 meq/L (ref 96–112)
CO2: 32 meq/L (ref 19–32)
CREATININE: 1.48 mg/dL — AB (ref 0.40–1.20)
Calcium: 9.8 mg/dL (ref 8.4–10.5)
GFR: 37.12 mL/min — ABNORMAL LOW (ref >60.00)
Glucose, Bld: 112 mg/dL — ABNORMAL HIGH (ref 70–99)
Potassium: 4.1 mEq/L (ref 3.5–5.1)
Sodium: 141 mEq/L (ref 135–145)

## 2015-01-26 LAB — HEPATIC FUNCTION PANEL
ALT: 9 U/L (ref 0–35)
AST: 13 U/L (ref 0–37)
Albumin: 4.2 g/dL (ref 3.5–5.2)
Alkaline Phosphatase: 70 U/L (ref 39–117)
BILIRUBIN DIRECT: 0.1 mg/dL (ref 0.0–0.3)
TOTAL PROTEIN: 8.3 g/dL (ref 6.0–8.3)
Total Bilirubin: 0.4 mg/dL (ref 0.2–1.2)

## 2015-01-26 MED ORDER — GABAPENTIN 300 MG PO CAPS: ORAL_CAPSULE | ORAL | Status: DC

## 2015-01-26 NOTE — Assessment & Plan Note (Signed)
stable overall by history and exam, recent data reviewed with pt, and pt to continue medical treatment as before,  to f/u any worsening symptoms or concerns BP Readings from Last 3 Encounters:  01/26/15 130/76  08/27/14 132/84  08/06/14 136/84

## 2015-01-26 NOTE — Progress Notes (Signed)
Pre visit review using our clinic review tool, if applicable. No additional management support is needed unless otherwise documented below in the visit note. 

## 2015-01-26 NOTE — Assessment & Plan Note (Signed)
No volume overload, ok for referral for card f/u

## 2015-01-26 NOTE — Assessment & Plan Note (Signed)
stable overall by history and exam, recent data reviewed with pt, and pt to continue medical treatment as before,  to f/u any worsening symptoms or concerns SpO2 Readings from Last 3 Encounters:  01/26/15 96%  08/27/14 96%  07/07/14 97%   Ok to remain off symbicort, consider restart if needed

## 2015-01-26 NOTE — Progress Notes (Signed)
Subjective:    Patient ID: Alyssa Ingram, female    DOB: 06/14/1945, 69 y.o.   MRN: [id number]  HPI  Here to f/u; overall doing ok,  Pt denies chest pain, increasing sob or doe, wheezing, orthopnea, PND, increased LE swelling, palpitations, dizziness or syncope.  Pt denies new neurological symptoms such as new headache, or facial or extremity weakness or numbness.  Pt denies polydipsia, polyuria, or low sugar episode.   Pt denies new neurological symptoms such as new headache, or facial or extremity weakness or numbness.   Pt states overall good compliance with meds, mostly trying to follow appropriate diet, with wt overall stable,  but little exercise however. Not taking the symbicort as seems to not need for now.  Had visit with Dr Tamala Julian with left knee severe DJD, putting off surgury for now. Has not had card f/u since dec 2013. Has bilat feet burning at night, asks for med trial gabapentin (husbnad had lyrica but too expensive). Past Medical History  Diagnosis Date  . Arthritis   . Asthma   . Cancer   . GERD (gastroesophageal reflux disease)   . Hypertension   . Kidney stones   . Chronic diastolic heart failure   . Hyperlipidemia   . HOCM (hypertrophic obstructive cardiomyopathy)     Echo 12/19/11: Severe LVH, EF 55-65%, dynamic obstruction, wall motion normal, grade 1 diastolic dysfunction, systolic anterior motion of the mitral valve with mild MS and mild MR, mild LAE, PASP 34  . Abnormal MRI, spine 11/2011    ?discitis L5-S1, s/p CT bx 12/12/11  . H/O cardiovascular stress test     Nuclear study in 2011 normal perfusion  . Obesity   . Type II or unspecified type diabetes mellitus without mention of complication, uncontrolled 01/31/2013  . Allergic rhinitis, cause unspecified 01/31/2013   Past Surgical History  Procedure Laterality Date  . Abdominal hysterectomy    . Rotater cuff      right, 2005  . Joint replacement      right knee 2006  . Nephrolithiasis      reports that she  has never smoked. She has never used smokeless tobacco. She reports that she does not drink alcohol or use illicit drugs. family history includes Alcohol abuse in her other; Arthritis in her other and other; Cancer in her other; Diabetes in her other and other; Heart disease in her father, other, and other; Hypertension in her other and other; Kidney disease in her other; Mental illness in her other; Sudden death in her other. There is no history of Colon cancer. Allergies  Allergen Reactions  . Sulfa Antibiotics Hives  . Other Hives    Unknown drug   Current Outpatient Prescriptions on File Prior to Visit  Medication Sig Dispense Refill  . Acetaminophen-Codeine (TYLENOL/CODEINE #3) 300-30 MG per tablet Take 1 tablet by mouth every 6 (six) hours as needed for pain. (Patient not taking: Reported on 01/26/2015) 60 tablet 0  . albuterol (VENTOLIN HFA) 108 (90 BASE) MCG/ACT inhaler Inhale 2 puffs into the lungs 2 (two) times daily. 1 Inhaler 3  . aspirin 81 MG tablet Take 81 mg by mouth. Take 2 pills daily    . Blood Glucose Monitoring Suppl (ONETOUCH VERIO IQ SYSTEM) W/DEVICE KIT Use to check blood sugars daily E11.9 1 kit 0  . cetirizine (ZYRTEC) 10 MG tablet Take 1 tablet (10 mg total) by mouth daily. 30 tablet 11  . cyclobenzaprine (FLEXERIL) 10 MG tablet Take 1 tablet (  10 mg total) by mouth 3 (three) times daily as needed for muscle spasms. 90 tablet 6  . fluticasone (FLONASE) 50 MCG/ACT nasal spray Place 2 sprays into both nostrils daily. 16 g 2  . furosemide (LASIX) 40 MG tablet TAKE 1 AND 1/2 TABLETS TWICE DAILY 90 tablet 3  . glucose blood test strip 1 each by Other route 3 (three) times daily. Use to check blood sugars twice a day E11.9 100 each 11  . lovastatin (MEVACOR) 20 MG tablet Take 40 mg by mouth at bedtime.    . meclizine (ANTIVERT) 25 MG tablet TAKE 1 TABLET (25 MG TOTAL) BY MOUTH 3 (THREE) TIMES DAILY. 60 tablet 0  . metFORMIN (GLUCOPHAGE) 500 MG tablet TAKE ONE TABLET BY MOUTH  TWICE DAILY WITH MEALS 180 tablet 3  . metoprolol (LOPRESSOR) 50 MG tablet Take 1 tablet (50 mg total) by mouth 2 (two) times daily. 180 tablet 3  . ONETOUCH DELICA LANCETS 52Y MISC 1 each by Other route daily. Use as directed to test blood sugar daily dx 250.00 60 each 11  . pantoprazole (PROTONIX) 40 MG tablet Take 1 tablet (40 mg total) by mouth daily. 90 tablet 3  . valsartan (DIOVAN) 320 MG tablet Take 1 tablet (320 mg total) by mouth daily. 90 tablet 3   No current facility-administered medications on file prior to visit.    Review of Systems  Constitutional: Negative for unusual diaphoresis or night sweats HENT: Negative for ringing in ear or discharge Eyes: Negative for double vision or worsening visual disturbance.  Respiratory: Negative for choking and stridor.   Gastrointestinal: Negative for vomiting or other signifcant bowel change Genitourinary: Negative for hematuria or change in urine volume.  Musculoskeletal: Negative for other MSK pain or swelling Skin: Negative for color change and worsening wound.  Neurological: Negative for tremors and numbness other than noted  Psychiatric/Behavioral: Negative for decreased concentration or agitation other than above       Objective:   Physical Exam BP 130/76 mmHg  Pulse 63  Temp(Src) 98.2 F (36.8 C) (Oral)  Ht 5' (1.524 m)  Wt 217 lb (98.431 kg)  BMI 42.38 kg/m2  SpO2 96% VS noted,  Constitutional: Pt appears in no significant distress HENT: Head: NCAT.  Right Ear: External ear normal.  Left Ear: External ear normal.  Eyes: . Pupils are equal, round, and reactive to light. Conjunctivae and EOM are normal Neck: Normal range of motion. Neck supple.  Cardiovascular: Normal rate and regular rhythm.  wth 2-3/6 SEM Pulmonary/Chest: Effort normal and breath sounds without rales or wheezing.  Abd:  Soft, NT, ND, + BS Neurological: Pt is alert. Not confused , motor grossly intact Skin: Skin is warm. No rash, chronic left >  right 2+ edema LE's Psychiatric: Pt behavior is normal. No agitation.     Assessment & Plan:

## 2015-01-26 NOTE — Patient Instructions (Signed)
Please take all new medication as prescribed - the gabapentin at bedtime for feet pain  Please continue all other medications as before, and refills have been done if requested.  Please have the pharmacy call with any other refills you may need.  Please continue your efforts at being more active, low cholesterol diet, and weight control.  You are otherwise up to date with prevention measures today.  You will be contacted regarding the referral for: Cardiology  Please keep your other appointments with your specialists as you may have planned  Please go to the LAB in the Basement (turn left off the elevator) for the tests to be done today  You will be contacted by phone if any changes need to be made immediately.  Otherwise, you will receive a letter about your results with an explanation, but please check with MyChart first.  Please remember to sign up for MyChart if you have not done so, as this will be important to you in the future with finding out test results, communicating by private email, and scheduling acute appointments online when needed.  Please return in 6 months, or sooner if needed, with Lab testing done 3-5 days before

## 2015-01-26 NOTE — Assessment & Plan Note (Signed)
stable overall by history and exam, recent data reviewed with pt, and pt to continue medical treatment as before,  to f/u any worsening symptoms or concerns Lab Results  Component Value Date   HGBA1C 6.5 07/07/2014   For /fu labs

## 2015-01-26 NOTE — Assessment & Plan Note (Signed)
Likely neuritic by xam, for trial gabapentin qhs prn,  to f/u any worsening symptoms or concerns. Urged pt to f/u with podiatry yearly

## 2015-01-27 ENCOUNTER — Telehealth: Payer: Self-pay | Admitting: *Deleted

## 2015-01-27 ENCOUNTER — Encounter: Payer: Self-pay | Admitting: Internal Medicine

## 2015-01-27 MED ORDER — GLUCOSE BLOOD VI STRP: 1.0000 | ORAL_STRIP | Freq: Three times a day (TID) | Status: DC

## 2015-01-27 NOTE — Telephone Encounter (Signed)
Left msg on triage pt is needing refills on her one touch testing supplies.Durene Cal electronically....Johny Chess

## 2015-02-04 ENCOUNTER — Encounter: Payer: Self-pay | Admitting: Internal Medicine

## 2015-02-04 ENCOUNTER — Ambulatory Visit (INDEPENDENT_AMBULATORY_CARE_PROVIDER_SITE_OTHER): Payer: Medicare Other | Admitting: Internal Medicine

## 2015-02-04 ENCOUNTER — Other Ambulatory Visit (INDEPENDENT_AMBULATORY_CARE_PROVIDER_SITE_OTHER): Payer: Medicare Other

## 2015-02-04 VITALS — BP 138/88 | HR 73 | Temp 97.9°F | Ht 60.0 in | Wt 219.0 lb

## 2015-02-04 DIAGNOSIS — I10 Essential (primary) hypertension: Secondary | ICD-10-CM

## 2015-02-04 DIAGNOSIS — E119 Type 2 diabetes mellitus without complications: Secondary | ICD-10-CM

## 2015-02-04 DIAGNOSIS — N179 Acute kidney failure, unspecified: Secondary | ICD-10-CM

## 2015-02-04 LAB — BASIC METABOLIC PANEL
BUN: 25 mg/dL — AB (ref 6–23)
CALCIUM: 9.9 mg/dL (ref 8.4–10.5)
CO2: 31 mEq/L (ref 19–32)
Chloride: 101 mEq/L (ref 96–112)
Creatinine, Ser: 0.97 mg/dL (ref 0.40–1.20)
GFR: 60.44 mL/min (ref >60.00)
GLUCOSE: 104 mg/dL — AB (ref 70–99)
Potassium: 4.2 mEq/L (ref 3.5–5.1)
SODIUM: 139 meq/L (ref 135–145)

## 2015-02-04 MED ORDER — DILTIAZEM HCL ER COATED BEADS 180 MG PO CP24: 180.0000 mg | ORAL_CAPSULE | Freq: Every day | ORAL | Status: DC

## 2015-02-04 NOTE — Patient Instructions (Signed)
OK to stay off the diovan as you have  Please take all new medication as prescribed - the lower dose diltiazem (cardizem) for BP  Please continue all other medications as before, and refills have been done if requested.  Please have the pharmacy call with any other refills you may need.  Please continue your efforts at being more active, low cholesterol diet, and weight control.  Please keep your appointments with your specialists as you may have planned  Please go to the LAB in the Basement (turn left off the elevator) for the tests to be done today  You will be contacted by phone if any changes need to be made immediately.  Otherwise, you will receive a letter about your results with an explanation, but please check with MyChart first.  Please remember to sign up for MyChart if you have not done so, as this will be important to you in the future with finding out test results, communicating by private email, and scheduling acute appointments online when needed.  Please return in 3 months, or sooner if needed

## 2015-02-04 NOTE — Progress Notes (Signed)
Subjective:    Patient ID: Alyssa Ingram, female    DOB: 1946/04/11, 69 y.o.   MRN: [id number]  HPI  Here to f/u AKi, overall doing ok,  Pt denies chest pain, increasing sob or doe, wheezing, orthopnea, PND, increased LE swelling, palpitations, dizziness or syncope.  Pt denies new neurological symptoms such as new headache, or facial or extremity weakness or numbness.  Pt denies polydipsia, polyuria, or low sugar episode.   Pt denies new neurological symptoms such as new headache, or facial or extremity weakness or numbness.   Pt states overall good compliance with meds, mostly trying to follow appropriate diet, with wt overall stable,  Remains off the diovan.. BP Readings from Last 3 Encounters:  02/04/15 146/92  01/26/15 130/76  08/27/14 132/84   Wt Readings from Last 3 Encounters:  02/04/15 219 lb (99.338 kg)  01/26/15 217 lb (98.431 kg)  08/27/14 211 lb (95.709 kg)   Past Medical History  Diagnosis Date  . Arthritis   . Asthma   . Cancer   . GERD (gastroesophageal reflux disease)   . Hypertension   . Kidney stones   . Chronic diastolic heart failure   . Hyperlipidemia   . HOCM (hypertrophic obstructive cardiomyopathy)     Echo 12/19/11: Severe LVH, EF 55-65%, dynamic obstruction, wall motion normal, grade 1 diastolic dysfunction, systolic anterior motion of the mitral valve with mild MS and mild MR, mild LAE, PASP 34  . Abnormal MRI, spine 11/2011    ?discitis L5-S1, s/p CT bx 12/12/11  . H/O cardiovascular stress test     Nuclear study in 2011 normal perfusion  . Obesity   . Type II or unspecified type diabetes mellitus without mention of complication, uncontrolled 01/31/2013  . Allergic rhinitis, cause unspecified 01/31/2013   Past Surgical History  Procedure Laterality Date  . Abdominal hysterectomy    . Rotater cuff      right, 2005  . Joint replacement      right knee 2006  . Nephrolithiasis      reports that she has never smoked. She has never used smokeless  tobacco. She reports that she does not drink alcohol or use illicit drugs. family history includes Alcohol abuse in her other; Arthritis in her other and other; Cancer in her other; Diabetes in her other and other; Heart disease in her father, other, and other; Hypertension in her other and other; Kidney disease in her other; Mental illness in her other; Sudden death in her other. There is no history of Colon cancer. Allergies  Allergen Reactions  . Sulfa Antibiotics Hives  . Other Hives    Unknown drug   Current Outpatient Prescriptions on File Prior to Visit  Medication Sig Dispense Refill  . albuterol (VENTOLIN HFA) 108 (90 BASE) MCG/ACT inhaler Inhale 2 puffs into the lungs 2 (two) times daily. 1 Inhaler 3  . aspirin 81 MG tablet Take 81 mg by mouth. Take 2 pills daily    . Blood Glucose Monitoring Suppl (ONETOUCH VERIO IQ SYSTEM) W/DEVICE KIT Use to check blood sugars daily E11.9 1 kit 0  . cetirizine (ZYRTEC) 10 MG tablet Take 1 tablet (10 mg total) by mouth daily. 30 tablet 11  . cyclobenzaprine (FLEXERIL) 10 MG tablet Take 1 tablet (10 mg total) by mouth 3 (three) times daily as needed for muscle spasms. 90 tablet 6  . fluticasone (FLONASE) 50 MCG/ACT nasal spray Place 2 sprays into both nostrils daily. 16 g 2  . furosemide (LASIX)  40 MG tablet TAKE 1 AND 1/2 TABLETS TWICE DAILY 90 tablet 3  . gabapentin (NEURONTIN) 300 MG capsule 1-2 tabs by mouth at bedtime 60 capsule 5  . glucose blood (ONETOUCH VERIO) test strip 1 each by Other route 3 (three) times daily. Use to check blood sugars three times a day Dx E11.9 270 each 3  . glucose blood test strip 1 each by Other route 3 (three) times daily. Use to check blood sugars twice a day E11.9 100 each 11  . lovastatin (MEVACOR) 20 MG tablet Take 40 mg by mouth at bedtime.    . meclizine (ANTIVERT) 25 MG tablet TAKE 1 TABLET (25 MG TOTAL) BY MOUTH 3 (THREE) TIMES DAILY. 60 tablet 0  . metFORMIN (GLUCOPHAGE) 500 MG tablet TAKE ONE TABLET BY  MOUTH TWICE DAILY WITH MEALS 180 tablet 3  . metoprolol (LOPRESSOR) 50 MG tablet Take 1 tablet (50 mg total) by mouth 2 (two) times daily. 180 tablet 3  . ONETOUCH DELICA LANCETS 81E MISC 1 each by Other route daily. Use as directed to test blood sugar daily dx 250.00 60 each 11  . pantoprazole (PROTONIX) 40 MG tablet Take 1 tablet (40 mg total) by mouth daily. 90 tablet 3  . Acetaminophen-Codeine (TYLENOL/CODEINE #3) 300-30 MG per tablet Take 1 tablet by mouth every 6 (six) hours as needed for pain. (Patient not taking: Reported on 01/26/2015) 60 tablet 0   No current facility-administered medications on file prior to visit.     Review of Systems  Constitutional: Negative for unusual diaphoresis or night sweats HENT: Negative for ringing in ear or discharge Eyes: Negative for double vision or worsening visual disturbance.  Respiratory: Negative for choking and stridor.   Gastrointestinal: Negative for vomiting or other signifcant bowel change Genitourinary: Negative for hematuria or change in urine volume.  Musculoskeletal: Negative for other MSK pain or swelling Skin: Negative for color change and worsening wound.  Neurological: Negative for tremors and numbness other than noted  Psychiatric/Behavioral: Negative for decreased concentration or agitation other than above       Objective:   Physical Exam BP 146/92 mmHg  Pulse 73  Temp(Src) 97.9 F (36.6 C) (Oral)  Ht 5' (1.524 m)  Wt 219 lb (99.338 kg)  BMI 42.77 kg/m2  SpO2 98% VS noted,  Constitutional: Pt appears in no significant distress HENT: Head: NCAT.  Right Ear: External ear normal.  Left Ear: External ear normal.  Eyes: . Pupils are equal, round, and reactive to light. Conjunctivae and EOM are normal Neck: Normal range of motion. Neck supple.  Cardiovascular: Normal rate and regular rhythm.   Pulmonary/Chest: Effort normal and breath sounds without rales or wheezing.  Abd:  Soft, NT, ND, + BS Neurological: Pt is  alert. Not confused , motor grossly intact Skin: Skin is warm. No rash, no LE edema Psychiatric: Pt behavior is normal. No agitation.     Assessment & Plan:

## 2015-02-04 NOTE — Progress Notes (Signed)
Pre visit review using our clinic review tool, if applicable. No additional management support is needed unless otherwise documented below in the visit note. 

## 2015-02-06 NOTE — Assessment & Plan Note (Signed)
stable overall by history and exam, recent data reviewed with pt, and pt to continue medical treatment as before,  to f/u any worsening symptoms or concerns Lab Results  Component Value Date   HGBA1C 6.0 01/26/2015

## 2015-02-06 NOTE — Assessment & Plan Note (Signed)
Pt asympt, exam benign, for f/u lab today, with renal u/s and/or renal referral if persistent\ Lab Results  Component Value Date   WBC 5.7 07/07/2014   HGB 10.4* 07/07/2014   HCT 32.0* 07/07/2014   PLT 269.0 07/07/2014   GLUCOSE 104* 02/04/2015   CHOL 227* 01/26/2015   TRIG 144.0 01/26/2015   HDL 54.30 01/26/2015   LDLCALC 144* 01/26/2015   ALT 9 01/26/2015   AST 13 01/26/2015   NA 139 02/04/2015   K 4.2 02/04/2015   CL 101 02/04/2015   CREATININE 0.97 02/04/2015   BUN 25* 02/04/2015   CO2 31 02/04/2015   TSH 0.43 07/07/2014   INR 1.03 12/12/2011   HGBA1C 6.0 01/26/2015   MICROALBUR <0.7 07/07/2014

## 2015-02-06 NOTE — Assessment & Plan Note (Addendum)
For dilt ER 180 qd as diovan stopped, with goal < 130/90, o/w stable overall by history and exam, recent data reviewed with pt, and pt to continue medical treatment as before,  to f/u any worsening symptoms or concerns BP Readings from Last 3 Encounters:  02/04/15 138/88  01/26/15 130/76  08/27/14 132/84

## 2015-02-23 ENCOUNTER — Telehealth: Payer: Self-pay | Admitting: Cardiology

## 2015-02-25 NOTE — Telephone Encounter (Signed)
Close encounter 

## 2015-03-01 ENCOUNTER — Ambulatory Visit: Payer: Medicare Other | Admitting: Cardiology

## 2015-03-12 ENCOUNTER — Telehealth: Payer: Self-pay | Admitting: Internal Medicine

## 2015-03-12 NOTE — Telephone Encounter (Signed)
Pt called in and states that she is really constipation and wants to know what she can take for it.  She has questions about her allergy meds as well

## 2015-03-14 ENCOUNTER — Other Ambulatory Visit: Payer: Self-pay | Admitting: Internal Medicine

## 2015-03-16 ENCOUNTER — Ambulatory Visit: Payer: Medicare Other | Admitting: Cardiology

## 2015-03-17 NOTE — Telephone Encounter (Signed)
First thing that is easy to tolerate is Miralax OTC - 17 gm po daily as needed  Can also try dulcolox prn, or glycerin suppository if needed otc as well  There is also magnesium citrate otc that could be done

## 2015-03-17 NOTE — Telephone Encounter (Signed)
Pt advised.

## 2015-03-19 ENCOUNTER — Other Ambulatory Visit: Payer: Self-pay | Admitting: Emergency Medicine

## 2015-03-19 MED ORDER — FUROSEMIDE 40 MG PO TABS: ORAL_TABLET | ORAL | Status: DC

## 2015-04-07 NOTE — Progress Notes (Signed)
HPI: FU HCM. Last seen 05/03/12. Patient previously lived in [place]. Records from there include: Nuclear study in November of 2011 showed an ejection fraction of 62% and normal perfusion. She carries a diagnosis of hypertrophic nonobstructive cardiomyopathy. Holter monitor in September 2013 showed sinus rhythm with PAC and PVC. Carotid dopplers 1/16 showed no significant stenosis. Echocardiogram January 2014 showed vigorous LV function, severe asymmetric septal hypertrophy, grade 1 diastolic dysfunction, systolic anterior motion of the mitral valve with mild mitral regurgitation, moderate left atrial enlargement and moderate tricuspid regurgitation. ETT February 2014 showed no drop in systolic blood pressure but poor exercise tolerance. Since I last saw her  Current Outpatient Prescriptions  Medication Sig Dispense Refill  . Acetaminophen-Codeine (TYLENOL/CODEINE #3) 300-30 MG per tablet Take 1 tablet by mouth every 6 (six) hours as needed for pain. (Patient not taking: Reported on 01/26/2015) 60 tablet 0  . albuterol (VENTOLIN HFA) 108 (90 BASE) MCG/ACT inhaler Inhale 2 puffs into the lungs 2 (two) times daily. 1 Inhaler 3  . aspirin 81 MG tablet Take 81 mg by mouth. Take 2 pills daily    . Blood Glucose Monitoring Suppl (ONETOUCH VERIO IQ SYSTEM) W/DEVICE KIT Use to check blood sugars daily E11.9 1 kit 0  . cetirizine (ZYRTEC) 10 MG tablet Take 1 tablet (10 mg total) by mouth daily. 30 tablet 11  . cyclobenzaprine (FLEXERIL) 10 MG tablet Take 1 tablet (10 mg total) by mouth 3 (three) times daily as needed for muscle spasms. 90 tablet 6  . diltiazem (CARDIZEM CD) 180 MG 24 hr capsule Take 1 capsule (180 mg total) by mouth daily. 90 capsule 3  . fluticasone (FLONASE) 50 MCG/ACT nasal spray Place 2 sprays into both nostrils daily. 16 g 2  . furosemide (LASIX) 40 MG tablet TAKE 1 AND 1/2 TABLETS TWICE DAILY 90 tablet 3  . gabapentin (NEURONTIN) 300 MG capsule 1-2 tabs by mouth at bedtime 60  capsule 5  . glucose blood (ONETOUCH VERIO) test strip 1 each by Other route 3 (three) times daily. Use to check blood sugars three times a day Dx E11.9 270 each 3  . glucose blood test strip 1 each by Other route 3 (three) times daily. Use to check blood sugars twice a day E11.9 100 each 11  . lovastatin (MEVACOR) 20 MG tablet Take 40 mg by mouth at bedtime.    . meclizine (ANTIVERT) 25 MG tablet TAKE 1 TABLET (25 MG TOTAL) BY MOUTH 3 (THREE) TIMES DAILY. 60 tablet 0  . metFORMIN (GLUCOPHAGE) 500 MG tablet TAKE ONE TABLET BY MOUTH TWICE DAILY WITH MEALS 180 tablet 3  . metoprolol (LOPRESSOR) 50 MG tablet Take 1 tablet (50 mg total) by mouth 2 (two) times daily. 180 tablet 3  . ONETOUCH DELICA LANCETS 89T MISC 1 each by Other route daily. Use as directed to test blood sugar daily dx 250.00 60 each 11  . pantoprazole (PROTONIX) 40 MG tablet Take 1 tablet (40 mg total) by mouth daily. 90 tablet 3   No current facility-administered medications for this visit.    Allergies  Allergen Reactions  . Sulfa Antibiotics Hives  . Other Hives    Unknown drug    Past Medical History  Diagnosis Date  . Arthritis   . Asthma   . Cancer   . GERD (gastroesophageal reflux disease)   . Hypertension   . Kidney stones   . Chronic diastolic heart failure   . Hyperlipidemia   . HOCM (hypertrophic obstructive  cardiomyopathy)     Echo 12/19/11: Severe LVH, EF 55-65%, dynamic obstruction, wall motion normal, grade 1 diastolic dysfunction, systolic anterior motion of the mitral valve with mild MS and mild MR, mild LAE, PASP 34  . Abnormal MRI, spine 11/2011    ?discitis L5-S1, s/p CT bx 12/12/11  . H/O cardiovascular stress test     Nuclear study in 2011 normal perfusion  . Obesity   . Type II or unspecified type diabetes mellitus without mention of complication, uncontrolled 01/31/2013  . Allergic rhinitis, cause unspecified 01/31/2013    Past Surgical History  Procedure Laterality Date  . Abdominal  hysterectomy    . Rotater cuff      right, 2005  . Joint replacement      right knee 2006  . Nephrolithiasis      Social History   Social History  . Marital Status: Married    Spouse Name: N/A  . Number of Children: 5  . Years of Education: 14   Occupational History  . Retired Optometrist    Social History Main Topics  . Smoking status: Never Smoker   . Smokeless tobacco: Never Used  . Alcohol Use: No  . Drug Use: No  . Sexual Activity: Not on file   Other Topics Concern  . Not on file   Social History Narrative    Family History  Problem Relation Age of Onset  . Arthritis Other   . Heart disease Other   . Hypertension Other   . Diabetes Other   . Alcohol abuse Other   . Arthritis Other   . Cancer Other     Lung Cancer  . Heart disease Other   . Hypertension Other   . Sudden death Other   . Kidney disease Other   . Mental illness Other   . Diabetes Other   . Heart disease Father   . Colon cancer Neg Hx     ROS: no fevers or chills, productive cough, hemoptysis, dysphasia, odynophagia, melena, hematochezia, dysuria, hematuria, rash, seizure activity, orthopnea, PND, pedal edema, claudication. Remaining systems are negative.  Physical Exam:   There were no vitals taken for this visit.  General:  Well developed/well nourished in NAD Skin warm/dry Patient not depressed No peripheral clubbing Back-normal HEENT-normal/normal eyelids Neck supple/normal carotid upstroke bilaterally; no bruits; no JVD; no thyromegaly chest - CTA/ normal expansion CV - RRR/normal S1 and S2; no murmurs, rubs or gallops;  PMI nondisplaced Abdomen -NT/ND, no HSM, no mass, + bowel sounds, no bruit 2+ femoral pulses, no bruits Ext-no edema, chords, 2+ DP Neuro-grossly nonfocal  ECG    This encounter was created in error - please disregard.

## 2015-04-16 ENCOUNTER — Encounter: Payer: Medicare Other | Admitting: Cardiology

## 2015-04-16 ENCOUNTER — Telehealth: Payer: Self-pay | Admitting: Family Medicine

## 2015-04-16 NOTE — Telephone Encounter (Signed)
Patient states she has a no show fee from April 12th.  Patient states she always cancels her appointment.  She is requesting to be waived.

## 2015-04-20 NOTE — Telephone Encounter (Signed)
Animal nutritionist

## 2015-04-27 ENCOUNTER — Other Ambulatory Visit: Payer: Self-pay | Admitting: Internal Medicine

## 2015-05-19 NOTE — Progress Notes (Signed)
HPI: Evaluate HCM. Patient previously lived in [place]. Records from there include: Nuclear study in November of 2011 showed an ejection fraction of 62% and normal perfusion. Holter monitor in September 2013 showed sinus rhythm with PAC and PVC. Exercise treadmill February 2014 showed poor exercise capacity and study was technically difficult. However no hypotension noted. Carotid Dopplers January 2014 showed no significant carotid disease. Echocardiogram January 2014 showed vigorous LV function, severe asymmetric septal hypertrophy, grade 1 diastolic dysfunction, systolic anterior motion with mild mitral regurgitation, moderate left atrial enlargement and moderate tricuspid regurgitation. Patient denies dyspnea, chest pain, palpitations or syncope. She does have chronic pedal edema.  Current Outpatient Prescriptions  Medication Sig Dispense Refill  . Acetaminophen-Codeine (TYLENOL/CODEINE #3) 300-30 MG per tablet Take 1 tablet by mouth every 6 (six) hours as needed for pain. 60 tablet 0  . albuterol (VENTOLIN HFA) 108 (90 BASE) MCG/ACT inhaler Inhale 2 puffs into the lungs 2 (two) times daily. 1 Inhaler 3  . aspirin 81 MG tablet Take 81 mg by mouth. Take 2 pills daily    . Blood Glucose Monitoring Suppl (ONETOUCH VERIO IQ SYSTEM) W/DEVICE KIT Use to check blood sugars daily E11.9 1 kit 0  . cetirizine (ZYRTEC) 10 MG tablet Take 1 tablet (10 mg total) by mouth daily. 30 tablet 11  . cyclobenzaprine (FLEXERIL) 10 MG tablet Take 1 tablet (10 mg total) by mouth 3 (three) times daily as needed for muscle spasms. 90 tablet 6  . diltiazem (CARDIZEM CD) 180 MG 24 hr capsule Take 1 capsule (180 mg total) by mouth daily. 90 capsule 3  . fluticasone (FLONASE) 50 MCG/ACT nasal spray Place 2 sprays into both nostrils daily. 16 g 2  . furosemide (LASIX) 40 MG tablet TAKE 1 AND 1/2 TABLETS TWICE DAILY 90 tablet 3  . furosemide (LASIX) 40 MG tablet TAKE 1 AND 1/2 TABLETS TWICE DAILY 90 tablet 2  .  gabapentin (NEURONTIN) 300 MG capsule 1-2 tabs by mouth at bedtime 60 capsule 5  . glucose blood (ONETOUCH VERIO) test strip 1 each by Other route 3 (three) times daily. Use to check blood sugars three times a day Dx E11.9 270 each 3  . glucose blood test strip 1 each by Other route 3 (three) times daily. Use to check blood sugars twice a day E11.9 100 each 11  . lovastatin (MEVACOR) 20 MG tablet Take 40 mg by mouth at bedtime.    . meclizine (ANTIVERT) 25 MG tablet TAKE 1 TABLET (25 MG TOTAL) BY MOUTH 3 (THREE) TIMES DAILY. 60 tablet 0  . metFORMIN (GLUCOPHAGE) 500 MG tablet TAKE ONE TABLET BY MOUTH TWICE DAILY WITH MEALS 180 tablet 3  . metoprolol (LOPRESSOR) 50 MG tablet Take 1 tablet (50 mg total) by mouth 2 (two) times daily. 180 tablet 3  . ONETOUCH DELICA LANCETS 92K MISC 1 each by Other route daily. Use as directed to test blood sugar daily dx 250.00 60 each 11  . pantoprazole (PROTONIX) 40 MG tablet Take 1 tablet (40 mg total) by mouth daily. 90 tablet 3   No current facility-administered medications for this visit.    Allergies  Allergen Reactions  . Sulfa Antibiotics Hives  . Other Hives    Unknown drug     Past Medical History  Diagnosis Date  . Arthritis   . Asthma   . Cancer ([place])   . GERD (gastroesophageal reflux disease)   . Hypertension   . Kidney stones   . Chronic diastolic  heart failure ([place])   . Hyperlipidemia   . HOCM (hypertrophic obstructive cardiomyopathy) ([place])     Echo 12/19/11: Severe LVH, EF 55-65%, dynamic obstruction, wall motion normal, grade 1 diastolic dysfunction, systolic anterior motion of the mitral valve with mild MS and mild MR, mild LAE, PASP 34  . Abnormal MRI, spine 11/2011    ?discitis L5-S1, s/p CT bx 12/12/11  . H/O cardiovascular stress test     Nuclear study in 2011 normal perfusion  . Obesity   . Type II or unspecified type diabetes mellitus without mention of complication, uncontrolled 01/31/2013  . Allergic rhinitis, cause  unspecified 01/31/2013    Past Surgical History  Procedure Laterality Date  . Abdominal hysterectomy    . Rotater cuff      right, 2005  . Joint replacement      right knee 2006  . Nephrolithiasis      Social History   Social History  . Marital Status: Widowed    Spouse Name: N/A  . Number of Children: 5  . Years of Education: 14   Occupational History  . Retired Optometrist    Social History Main Topics  . Smoking status: Never Smoker   . Smokeless tobacco: Never Used  . Alcohol Use: No  . Drug Use: No  . Sexual Activity: Not on file   Other Topics Concern  . Not on file   Social History Narrative    Family History  Problem Relation Age of Onset  . Arthritis Other   . Heart disease Other   . Hypertension Other   . Diabetes Other   . Alcohol abuse Other   . Arthritis Other   . Cancer Other     Lung Cancer  . Heart disease Other   . Hypertension Other   . Sudden death Other   . Kidney disease Other   . Mental illness Other   . Diabetes Other   . Heart disease Father   . Colon cancer Neg Hx     ROS: no fevers or chills, productive cough, hemoptysis, dysphasia, odynophagia, melena, hematochezia, dysuria, hematuria, rash, seizure activity, orthopnea, PND, claudication. Remaining systems are negative.  Physical Exam:   Blood pressure 140/82, pulse 70, height 5' (1.524 m), weight 200 lb (90.719 kg).  General:  Well developed/obese in NAD Skin warm/dry Patient not depressed No peripheral clubbing Back-normal HEENT-normal/normal eyelids Neck supple/normal carotid upstroke bilaterally; no bruits; no JVD; no thyromegaly chest - CTA/ normal expansion CV - RRR/normal S1 and S2; no rubs or gallops;  PMI nondisplaced, 1/6 systolic murmur Abdomen -NT/ND, no HSM, no mass, + bowel sounds, no bruit Ext-1-2+ ankle edema, no chords Neuro-grossly nonfocal  ECG Sinus rhythm at a rate of 70.Nonspecific ST changes.

## 2015-05-21 ENCOUNTER — Ambulatory Visit (INDEPENDENT_AMBULATORY_CARE_PROVIDER_SITE_OTHER): Payer: Commercial Managed Care - HMO | Admitting: Cardiology

## 2015-05-21 ENCOUNTER — Encounter: Payer: Self-pay | Admitting: Cardiology

## 2015-05-21 VITALS — BP 140/82 | HR 70 | Ht 60.0 in | Wt 200.0 lb

## 2015-05-21 DIAGNOSIS — I5032 Chronic diastolic (congestive) heart failure: Secondary | ICD-10-CM | POA: Diagnosis not present

## 2015-05-21 DIAGNOSIS — I421 Obstructive hypertrophic cardiomyopathy: Secondary | ICD-10-CM

## 2015-05-21 DIAGNOSIS — I10 Essential (primary) hypertension: Secondary | ICD-10-CM

## 2015-05-21 NOTE — Assessment & Plan Note (Addendum)
Patient with history of hypertrophic obstructive cardiomyopathy. Continue present medications. Repeat echocardiogram. Check 24-hour Holter monitor for nonsustained ventricular tachycardia. Patient instructed to have her children screened. Note no family history of sudden cardiac deathAnd no history of syncope.

## 2015-05-21 NOTE — Assessment & Plan Note (Signed)
History of diastolic congestive heart failure. Continue present dose of Lasix. Check echocardiogram.

## 2015-05-21 NOTE — Assessment & Plan Note (Signed)
Blood pressure controlled. Continue present medications. 

## 2015-05-21 NOTE — Patient Instructions (Signed)
Medication Instructions:   NO CHANGE  Testing/Procedures:  Your physician has requested that you have an echocardiogram. Echocardiography is a painless test that uses sound waves to create images of your heart. It provides your doctor with information about the size and shape of your heart and how well your heart's chambers and valves are working. This procedure takes approximately one hour. There are no restrictions for this procedure.   Your physician has recommended that you wear a 24 HOUR holter monitor. Holter monitors are medical devices that record the heart's electrical activity. Doctors most often use these monitors to diagnose arrhythmias. Arrhythmias are problems with the speed or rhythm of the heartbeat. The monitor is a small, portable device. You can wear one while you do your normal daily activities. This is usually used to diagnose what is causing palpitations/syncope (passing out).    Follow-Up:  Your physician wants you to follow-up in: [place] will receive a reminder letter in the mail two months in advance. If you don't receive a letter, please call our office to schedule the follow-up appointment.   If you need a refill on your cardiac medications before your next appointment, please call your pharmacy.

## 2015-05-31 ENCOUNTER — Other Ambulatory Visit: Payer: Self-pay | Admitting: Internal Medicine

## 2015-06-01 ENCOUNTER — Other Ambulatory Visit (HOSPITAL_COMMUNITY): Payer: Commercial Managed Care - HMO

## 2015-06-09 ENCOUNTER — Other Ambulatory Visit (HOSPITAL_COMMUNITY): Payer: Commercial Managed Care - HMO

## 2015-06-10 ENCOUNTER — Other Ambulatory Visit: Payer: Self-pay | Admitting: Internal Medicine

## 2015-06-14 ENCOUNTER — Telehealth: Payer: Self-pay | Admitting: Internal Medicine

## 2015-06-14 DIAGNOSIS — H538 Other visual disturbances: Secondary | ICD-10-CM

## 2015-06-14 NOTE — Telephone Encounter (Signed)
Patient is requesting a referral to aphthalmologist.  Patient does not want to go to [facility] eye center.  Patient is requesting an urgent referral because she can not see too good out of one eye.

## 2015-06-15 NOTE — Telephone Encounter (Signed)
referral done.

## 2015-06-15 NOTE — Telephone Encounter (Signed)
Notified patient.

## 2015-06-16 NOTE — Telephone Encounter (Signed)
Referral [id number]  start date 06/24/15, exp 12/21/15 good for 6 visits. Dr Jola Schmidt

## 2015-06-23 ENCOUNTER — Other Ambulatory Visit: Payer: Self-pay | Admitting: Cardiology

## 2015-06-23 DIAGNOSIS — I421 Obstructive hypertrophic cardiomyopathy: Secondary | ICD-10-CM

## 2015-06-23 DIAGNOSIS — I493 Ventricular premature depolarization: Secondary | ICD-10-CM

## 2015-06-24 ENCOUNTER — Ambulatory Visit (INDEPENDENT_AMBULATORY_CARE_PROVIDER_SITE_OTHER): Payer: Commercial Managed Care - HMO

## 2015-06-24 ENCOUNTER — Other Ambulatory Visit: Payer: Self-pay

## 2015-06-24 ENCOUNTER — Ambulatory Visit (HOSPITAL_COMMUNITY): Payer: Commercial Managed Care - HMO | Attending: Cardiology

## 2015-06-24 DIAGNOSIS — I493 Ventricular premature depolarization: Secondary | ICD-10-CM

## 2015-06-24 DIAGNOSIS — I071 Rheumatic tricuspid insufficiency: Secondary | ICD-10-CM | POA: Insufficient documentation

## 2015-06-24 DIAGNOSIS — I421 Obstructive hypertrophic cardiomyopathy: Secondary | ICD-10-CM

## 2015-06-24 DIAGNOSIS — I517 Cardiomegaly: Secondary | ICD-10-CM | POA: Insufficient documentation

## 2015-06-24 DIAGNOSIS — E119 Type 2 diabetes mellitus without complications: Secondary | ICD-10-CM | POA: Insufficient documentation

## 2015-06-24 DIAGNOSIS — I059 Rheumatic mitral valve disease, unspecified: Secondary | ICD-10-CM | POA: Diagnosis not present

## 2015-06-24 DIAGNOSIS — E669 Obesity, unspecified: Secondary | ICD-10-CM | POA: Insufficient documentation

## 2015-06-24 DIAGNOSIS — I10 Essential (primary) hypertension: Secondary | ICD-10-CM | POA: Diagnosis not present

## 2015-06-24 DIAGNOSIS — I358 Other nonrheumatic aortic valve disorders: Secondary | ICD-10-CM | POA: Insufficient documentation

## 2015-06-24 DIAGNOSIS — Z6839 Body mass index (BMI) 39.0-39.9, adult: Secondary | ICD-10-CM | POA: Insufficient documentation

## 2015-06-24 DIAGNOSIS — E785 Hyperlipidemia, unspecified: Secondary | ICD-10-CM | POA: Diagnosis not present

## 2015-06-25 DIAGNOSIS — H349 Unspecified retinal vascular occlusion: Secondary | ICD-10-CM | POA: Diagnosis not present

## 2015-06-25 DIAGNOSIS — Z961 Presence of intraocular lens: Secondary | ICD-10-CM | POA: Diagnosis not present

## 2015-06-25 DIAGNOSIS — Z0100 Encounter for examination of eyes and vision without abnormal findings: Secondary | ICD-10-CM | POA: Diagnosis not present

## 2015-06-25 LAB — HM DIABETES EYE EXAM

## 2015-06-28 ENCOUNTER — Encounter: Payer: Self-pay | Admitting: Internal Medicine

## 2015-07-14 ENCOUNTER — Encounter: Payer: Self-pay | Admitting: *Deleted

## 2015-07-27 ENCOUNTER — Ambulatory Visit (INDEPENDENT_AMBULATORY_CARE_PROVIDER_SITE_OTHER): Payer: Commercial Managed Care - HMO | Admitting: Internal Medicine

## 2015-07-27 ENCOUNTER — Other Ambulatory Visit (INDEPENDENT_AMBULATORY_CARE_PROVIDER_SITE_OTHER): Payer: Commercial Managed Care - HMO

## 2015-07-27 ENCOUNTER — Encounter: Payer: Self-pay | Admitting: Internal Medicine

## 2015-07-27 VITALS — BP 138/84 | HR 103 | Temp 97.6°F | Resp 20 | Wt 230.0 lb

## 2015-07-27 DIAGNOSIS — J4530 Mild persistent asthma, uncomplicated: Secondary | ICD-10-CM | POA: Diagnosis not present

## 2015-07-27 DIAGNOSIS — J309 Allergic rhinitis, unspecified: Secondary | ICD-10-CM | POA: Diagnosis not present

## 2015-07-27 DIAGNOSIS — Z0000 Encounter for general adult medical examination without abnormal findings: Secondary | ICD-10-CM

## 2015-07-27 DIAGNOSIS — E119 Type 2 diabetes mellitus without complications: Secondary | ICD-10-CM

## 2015-07-27 DIAGNOSIS — M129 Arthropathy, unspecified: Secondary | ICD-10-CM

## 2015-07-27 DIAGNOSIS — IMO0002 Reserved for concepts with insufficient information to code with codable children: Secondary | ICD-10-CM

## 2015-07-27 LAB — CBC WITH DIFFERENTIAL/PLATELET
BASOS PCT: 0.5 % (ref 0.0–3.0)
Basophils Absolute: 0 10*3/uL (ref 0.0–0.1)
EOS PCT: 2.8 % (ref 0.0–5.0)
Eosinophils Absolute: 0.1 10*3/uL (ref 0.0–0.7)
HEMATOCRIT: 35.8 % — AB (ref 36.0–46.0)
HEMOGLOBIN: 11.4 g/dL — AB (ref 12.0–15.0)
LYMPHS PCT: 30.9 % (ref 12.0–46.0)
Lymphs Abs: 1.4 10*3/uL (ref 0.7–4.0)
MCHC: 32 g/dL (ref 30.0–36.0)
MCV: 79.3 fl (ref 78.0–100.0)
MONO ABS: 0.3 10*3/uL (ref 0.1–1.0)
MONOS PCT: 5.8 % (ref 3.0–12.0)
Neutro Abs: 2.7 10*3/uL (ref 1.4–7.7)
Neutrophils Relative %: 60 % (ref 43.0–77.0)
Platelets: 313 10*3/uL (ref 150.0–400.0)
RBC: 4.51 Mil/uL (ref 3.87–5.11)
RDW: 18 % — AB (ref 11.5–15.5)
WBC: 4.5 10*3/uL (ref 4.0–10.5)

## 2015-07-27 LAB — BASIC METABOLIC PANEL
BUN: 20 mg/dL (ref 6–23)
CALCIUM: 9.6 mg/dL (ref 8.4–10.5)
CHLORIDE: 107 meq/L (ref 96–112)
CO2: 25 mEq/L (ref 19–32)
CREATININE: 0.78 mg/dL (ref 0.40–1.20)
GFR: 77.63 mL/min (ref >60.00)
Glucose, Bld: 121 mg/dL — ABNORMAL HIGH (ref 70–99)
Potassium: 3.8 mEq/L (ref 3.5–5.1)
Sodium: 142 mEq/L (ref 135–145)

## 2015-07-27 LAB — LIPID PANEL
CHOL/HDL RATIO: 4
Cholesterol: 199 mg/dL (ref 0–200)
HDL: 56.1 mg/dL (ref >39.00)
LDL CALC: 126 mg/dL — AB (ref 0–99)
NONHDL: 142.73
TRIGLYCERIDES: 85 mg/dL (ref 0.0–149.0)
VLDL: 17 mg/dL (ref 0.0–40.0)

## 2015-07-27 LAB — HEPATIC FUNCTION PANEL
ALT: 9 U/L (ref 0–35)
AST: 11 U/L (ref 0–37)
Albumin: 4 g/dL (ref 3.5–5.2)
Alkaline Phosphatase: 71 U/L (ref 39–117)
BILIRUBIN DIRECT: 0.1 mg/dL (ref 0.0–0.3)
BILIRUBIN TOTAL: 0.4 mg/dL (ref 0.2–1.2)
Total Protein: 8.2 g/dL (ref 6.0–8.3)

## 2015-07-27 LAB — HEPATITIS C ANTIBODY: HCV AB: NEGATIVE

## 2015-07-27 LAB — TSH: TSH: 0.81 u[IU]/mL (ref 0.35–4.50)

## 2015-07-27 LAB — HEMOGLOBIN A1C: Hgb A1c MFr Bld: 6.7 % — ABNORMAL HIGH (ref 4.6–6.5)

## 2015-07-27 MED ORDER — BUDESONIDE-FORMOTEROL FUMARATE 160-4.5 MCG/ACT IN AERO: 2.0000 | INHALATION_SPRAY | Freq: Two times a day (BID) | RESPIRATORY_TRACT | Status: DC

## 2015-07-27 MED ORDER — TRIAMCINOLONE ACETONIDE 55 MCG/ACT NA AERO: 2.0000 | INHALATION_SPRAY | Freq: Every day | NASAL | Status: DC

## 2015-07-27 NOTE — Assessment & Plan Note (Signed)

## 2015-07-27 NOTE — Assessment & Plan Note (Signed)
Mild to mod, for change flonase to nasacort to reduce risk of nosebleed,  to f/u any worsening symptoms or concerns

## 2015-07-27 NOTE — Assessment & Plan Note (Signed)
Also with pain to right, as well as pain and swelling to both knees, for f/u sport medicine , cont cane use

## 2015-07-27 NOTE — Patient Instructions (Signed)
You had the steroid shot today  Please take all new medication as prescribed - the symbicort, and the nasacort  OK to stop the flonase  Please continue all other medications as before, and refills have been done if requested.  Please have the pharmacy call with any other refills you may need.  Please continue your efforts at being more active, low cholesterol diet, and weight control.  You are otherwise up to date with prevention measures today.  Please keep your appointments with your specialists as you may have planned  Please go to the LAB in the Basement (turn left off the elevator) for the tests to be done today  You will be contacted by phone if any changes need to be made immediately.  Otherwise, you will receive a letter about your results with an explanation, but please check with MyChart first.  Please remember to sign up for MyChart if you have not done so, as this will be important to you in the future with finding out test results, communicating by private email, and scheduling acute appointments online when needed.  Please return in 6 months, or sooner if needed, with Lab testing done 3-5 days before

## 2015-07-27 NOTE — Progress Notes (Signed)
Subjective:    Patient ID: Alyssa Ingram, female    DOB: 29-Sep-1945, 70 y.o.   MRN: [id number]  HPI  Here for wellness and f/u;  Overall doing ok;  Pt denies Chest pain, worsening SOB, DOE, orthopnea, PND, worsening LE edema, palpitations, dizziness or syncope, but has had worsening wheezing in the past 3 wks with mild sob intermittent, better with Albut MDI but has to use so much..  Does have several wks ongoing nasal allergy symptoms with clearish congestion, itch and sneezing, without fever, pain, ST, cough, swelling or wheezing.  Pt denies neurological change such as new headache, facial or extremity weakness.  Pt denies polydipsia, polyuria, or low sugar symptoms. Pt states overall good compliance with treatment and medications, good tolerability, and has been trying to follow appropriate diet.  Pt denies worsening depressive symptoms, suicidal ideation or panic. No fever, night sweats, wt loss, loss of appetite, or other constitutional symptoms.  Pt states good ability with ADL's, has low fall risk, home safety reviewed and adequate, no other significant changes in hearing or vision, and only occasionally active with exercise, due to bilat knee pain and persistent swelling, required cortisone 3 mo ago, but now worse again, plans to f/u with sports medicine Past Medical History  Diagnosis Date  . Arthritis   . Asthma   . Cancer ([place])   . GERD (gastroesophageal reflux disease)   . Hypertension   . Kidney stones   . Chronic diastolic heart failure ([place])   . Hyperlipidemia   . HOCM (hypertrophic obstructive cardiomyopathy) ([place])     Echo 12/19/11: Severe LVH, EF 55-65%, dynamic obstruction, wall motion normal, grade 1 diastolic dysfunction, systolic anterior motion of the mitral valve with mild MS and mild MR, mild LAE, PASP 34  . Abnormal MRI, spine 11/2011    ?discitis L5-S1, s/p CT bx 12/12/11  . H/O cardiovascular stress test     Nuclear study in 2011 normal perfusion  . Obesity   . Type  II or unspecified type diabetes mellitus without mention of complication, uncontrolled 01/31/2013  . Allergic rhinitis, cause unspecified 01/31/2013   Past Surgical History  Procedure Laterality Date  . Abdominal hysterectomy    . Rotater cuff      right, 2005  . Joint replacement      right knee 2006  . Nephrolithiasis      reports that she has never smoked. She has never used smokeless tobacco. She reports that she does not drink alcohol or use illicit drugs. family history includes Alcohol abuse in her other; Arthritis in her other and other; Cancer in her other; Diabetes in her other and other; Heart disease in her father, other, and other; Hypertension in her other and other; Kidney disease in her other; Mental illness in her other; Sudden death in her other. There is no history of Colon cancer. Allergies  Allergen Reactions  . Sulfa Antibiotics Hives  . Other Hives    Unknown drug   Current Outpatient Prescriptions on File Prior to Visit  Medication Sig Dispense Refill  . Acetaminophen-Codeine (TYLENOL/CODEINE #3) 300-30 MG per tablet Take 1 tablet by mouth every 6 (six) hours as needed for pain. 60 tablet 0  . albuterol (VENTOLIN HFA) 108 (90 BASE) MCG/ACT inhaler Inhale 2 puffs into the lungs 2 (two) times daily. 1 Inhaler 3  . aspirin 81 MG tablet Take 81 mg by mouth. Take 2 pills daily    . Blood Glucose Monitoring Suppl (ONETOUCH VERIO IQ  SYSTEM) W/DEVICE KIT Use to check blood sugars daily E11.9 1 kit 0  . cetirizine (ZYRTEC) 10 MG tablet Take 1 tablet (10 mg total) by mouth daily. 30 tablet 11  . cyclobenzaprine (FLEXERIL) 10 MG tablet Take 1 tablet (10 mg total) by mouth 3 (three) times daily as needed for muscle spasms. 90 tablet 6  . diltiazem (CARDIZEM CD) 180 MG 24 hr capsule Take 1 capsule (180 mg total) by mouth daily. 90 capsule 3  . furosemide (LASIX) 40 MG tablet TAKE 1 AND 1/2 TABLETS TWICE DAILY 90 tablet 3  . furosemide (LASIX) 40 MG tablet TAKE 1 AND 1/2  TABLETS TWICE DAILY 90 tablet 2  . gabapentin (NEURONTIN) 300 MG capsule 1-2 tabs by mouth at bedtime 60 capsule 5  . glucose blood (ONETOUCH VERIO) test strip 1 each by Other route 3 (three) times daily. Use to check blood sugars three times a day Dx E11.9 270 each 3  . glucose blood test strip 1 each by Other route 3 (three) times daily. Use to check blood sugars twice a day E11.9 100 each 11  . lovastatin (MEVACOR) 20 MG tablet Take 40 mg by mouth at bedtime.    . meclizine (ANTIVERT) 25 MG tablet TAKE 1 TABLET (25 MG TOTAL) BY MOUTH 3 (THREE) TIMES DAILY. 60 tablet 0  . metFORMIN (GLUCOPHAGE) 500 MG tablet TAKE 1 TABLET BY MOUTH TWICE A DAY WITH MEALS 180 tablet 3  . metoprolol (LOPRESSOR) 50 MG tablet Take 1 tablet (50 mg total) by mouth 2 (two) times daily. 180 tablet 3  . ONETOUCH DELICA LANCETS 69G MISC 1 each by Other route daily. Use as directed to test blood sugar daily dx 250.00 60 each 11  . pantoprazole (PROTONIX) 40 MG tablet Take 1 tablet (40 mg total) by mouth daily. 90 tablet 3   No current facility-administered medications on file prior to visit.   Review of Systems Constitutional: Negative for increased diaphoresis, other activity, appetite or siginficant weight change other than noted HENT: Negative for worsening hearing loss, ear pain, facial swelling, mouth sores and neck stiffness.   Eyes: Negative for other worsening pain, redness or visual disturbance.  Respiratory: Negative for shortness of breath and wheezing  Cardiovascular: Negative for chest pain and palpitations.  Gastrointestinal: Negative for diarrhea, blood in stool, abdominal distention or other pain Genitourinary: Negative for hematuria, flank pain or change in urine volume.  Musculoskeletal: Negative for myalgias or other joint complaints.  Skin: Negative for color change and wound or drainage.  Neurological: Negative for syncope and numbness. other than noted Hematological: Negative for adenopathy. or  other swelling Psychiatric/Behavioral: Negative for hallucinations, SI, self-injury, decreased concentration or other worsening agitation.      Objective:   Physical Exam BP 138/84 mmHg  Pulse 103  Temp(Src) 97.6 F (36.4 C) (Oral)  Resp 20  Wt 230 lb (104.327 kg)  SpO2 94% VS noted, walks with cane, unable to manage up on exam table at all as unable to step up due to pain and off balance Constitutional: Pt is oriented to person, place, and time. Appears well-developed and well-nourished, in no significant distress Head: Normocephalic and atraumatic.  Right Ear: External ear normal.  Left Ear: External ear normal.  Nose: Nose normal.  Bilat tm's with mild erythema.  Max sinus areas non tender.  Pharynx with mild erythema, no exudate Mouth/Throat: Oropharynx is o/w clear and moist.  Eyes: Conjunctivae and EOM are normal. Pupils are equal, round, and reactive to  light.  Neck: Normal range of motion. Neck supple. No JVD present. No tracheal deviation present or significant neck LA or mass Cardiovascular: Normal rate, regular rhythm, normal heart sounds and intact distal pulses.   Pulmonary/Chest: Effort normal and breath sounds without rales or wheezing  Abdominal: Soft. Bowel sounds are normal. NT. No HSM  Musculoskeletal: Normal range of motion. Exhibits no edema.  Lymphadenopathy:  Has no cervical adenopathy.  Neurological: Pt is alert and oriented to person, place, and time. Pt has normal reflexes. No cranial nerve deficit. Motor grossly intact Skin: Skin is warm and dry. No rash noted.  Psychiatric:  Has normal mood and affect. Behavior is normal.  Bilat knees with mild bilat effusions, NT but reduced ROM left ot 100 degrees    Assessment & Plan:

## 2015-07-27 NOTE — Assessment & Plan Note (Signed)
Mild to mod, uncontrolled, for add symbicort asd, cont albut MDi prn,  to f/u any worsening symptoms or concerns

## 2015-07-27 NOTE — Progress Notes (Signed)
Pre visit review using our clinic review tool, if applicable. No additional management support is needed unless otherwise documented below in the visit note. 

## 2015-07-27 NOTE — Assessment & Plan Note (Signed)
stable overall by history and exam, recent data reviewed with pt, and pt to continue medical treatment as before,  to f/u any worsening symptoms or concerns Lab Results  Component Value Date   HGBA1C 6.0 01/26/2015    

## 2015-07-28 ENCOUNTER — Encounter: Payer: Self-pay | Admitting: Internal Medicine

## 2015-07-28 ENCOUNTER — Other Ambulatory Visit: Payer: Self-pay | Admitting: Internal Medicine

## 2015-07-28 MED ORDER — ROSUVASTATIN CALCIUM 20 MG PO TABS: 20.0000 mg | ORAL_TABLET | Freq: Every day | ORAL | Status: DC

## 2015-08-03 ENCOUNTER — Other Ambulatory Visit: Payer: Self-pay | Admitting: Internal Medicine

## 2015-08-28 ENCOUNTER — Other Ambulatory Visit: Payer: Self-pay | Admitting: Internal Medicine

## 2015-10-20 ENCOUNTER — Ambulatory Visit: Payer: Commercial Managed Care - HMO | Admitting: Family

## 2015-10-28 ENCOUNTER — Ambulatory Visit: Payer: Commercial Managed Care - HMO | Admitting: Internal Medicine

## 2015-11-03 ENCOUNTER — Ambulatory Visit: Payer: Commercial Managed Care - HMO | Admitting: Internal Medicine

## 2015-11-23 ENCOUNTER — Encounter: Payer: Self-pay | Admitting: Family Medicine

## 2015-11-23 ENCOUNTER — Ambulatory Visit (INDEPENDENT_AMBULATORY_CARE_PROVIDER_SITE_OTHER): Payer: Commercial Managed Care - HMO | Admitting: Family Medicine

## 2015-11-23 VITALS — BP 136/82 | HR 82 | Wt 216.0 lb

## 2015-11-23 DIAGNOSIS — I872 Venous insufficiency (chronic) (peripheral): Secondary | ICD-10-CM | POA: Diagnosis not present

## 2015-11-23 DIAGNOSIS — M1712 Unilateral primary osteoarthritis, left knee: Secondary | ICD-10-CM | POA: Diagnosis not present

## 2015-11-23 MED ORDER — ALLOPURINOL 100 MG PO TABS: 100.0000 mg | ORAL_TABLET | Freq: Every day | ORAL | Status: DC

## 2015-11-23 NOTE — Assessment & Plan Note (Signed)
Discussed patient is needing stronger compression socks. Patient well talked her primary care provider. Patient has stopped her Lasix secondary to stating that did not agree with her stomach.

## 2015-11-23 NOTE — Patient Instructions (Addendum)
Good to see you  Please figure out the diuretic.  ONce you get the fluid off it will help Injected knee today  pennsaid pinkie amount topically 2 times daily as needed.  Allopurinol 100mg  daily could help with the joint pains.  Vitamin D 2000 IU daily  They will call you on the brace.  See me again in 4 weeks and if knee is still pain ful then would consider the other injection.  So happy to see you!

## 2015-11-23 NOTE — Progress Notes (Signed)
[name] [address], [place] [address] Phone: [phone] Subjective:    I'm seeing this patient by the request  of:  Cathlean Cower, MD   CC: bilateral knee pain  [id number] Alyssa Ingram is a 70 y.o. female coming in with complaint of right knee pain.patient does have past medical history significant for a knee replacement in 2006. She was referred to an orthopedic surgeon for further evaluation. She has not had an appointment yet.  Patient is also complaining of left knee pain. 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 Last injection was greater than year ago. Patient did respond very well. Having worsening symptoms again. Worsening swelling in the legs as well. Patient is working with her primary care provider for this. Patient has been using a cane to help with ambulation. Noticing more instability of the knee as well. No new injury. Just worsening of previous symptoms.  Past Medical History  Diagnosis Date  . Arthritis   . Asthma   . Cancer ([place])   . GERD (gastroesophageal reflux disease)   . Hypertension   . Kidney stones   . Chronic diastolic heart failure ([place])   . Hyperlipidemia   . HOCM (hypertrophic obstructive cardiomyopathy) ([place])     Echo 12/19/11: Severe LVH, EF 55-65%, dynamic obstruction, wall motion normal, grade 1 diastolic dysfunction, systolic anterior motion of the mitral valve with mild MS and mild MR, mild LAE, PASP 34  . Abnormal MRI, spine 11/2011    ?discitis L5-S1, s/p CT bx 12/12/11  . H/O cardiovascular stress test     Nuclear study in 2011 normal perfusion  . Obesity   . Type II or unspecified type diabetes mellitus without mention of complication, uncontrolled 01/31/2013  . Allergic rhinitis, cause unspecified 01/31/2013   Past Surgical History  Procedure Laterality Date  . Abdominal hysterectomy    . Rotater cuff      right, 2005  . Joint replacement     right knee 2006  . Nephrolithiasis     Social History  Substance Use Topics  . Smoking status: Never Smoker   . Smokeless tobacco: Never Used  . Alcohol Use: No   Allergies  Allergen Reactions  . Sulfa Antibiotics Hives  . Other Hives    Unknown drug   Family History  Problem Relation Age of Onset  . Arthritis Other   . Heart disease Other   . Hypertension Other   . Diabetes Other   . Alcohol abuse Other   . Arthritis Other   . Cancer Other     Lung Cancer  . Heart disease Other   . Hypertension Other   . Sudden death Other   . Kidney disease Other   . Mental illness Other   . Diabetes Other   . Heart disease Father   . Colon cancer Neg Hx    . Review of Systems: No headache, visual changes, nausea, vomiting, diarrhea, constipation, dizziness, abdominal pain, skin rash, fevers, chills, night sweats, weight loss, swollen lymph nodes,  chest pain, shortness of breath, mood changes.   Objective Blood pressure 136/82, pulse 82, weight 216 lb (97.977 kg).  General: No apparent distress alert and oriented x3 mood and affect normal, dressed appropriately. obese HEENT: Pupils equal, extraocular movements intact  Respiratory: Patient's speak in full sentences and does not appear short of breath  Cardiovascular: 2+ lower extremity edemaworse than previous exam Symmetric, non tender,  no erythema  Skin: Warm dry intact with no signs of infection or rash on extremities or on axial skeleton.  Abdomen: Soft nontender  Neuro: Cranial nerves II through XII are intact, neurovascularly intact in all extremities with 2+ DTRs and 2+ pulses.  Lymph: 2+ pitting edema of the lower extremity bilaterally Gait Antalgic gait.  MSK:  Non tender with full range of motion and good stability and symmetric strength and tone of shoulders, elbows, wrist, hip, and ankles bilaterally.  Right knee exam shows the patient's incision is well-healed with no signs of infection. Patient has almost full  extension lacking the last 5 and flexion of 95. Patient does have somewhat of a positive clunk.  No change from previous exam.   Left knee shows significant To try ratio. Instability noted on exam. patient does have severe crepitus with range of motion is lacking the last 10 of extension as well as the last 10 of flexion. Patient is neurovascularly intact distally. Severe tenderness to palpation over the medial joint line  After informed written and verbal consent, patient was seated on exam table. Left knee was prepped with alcohol swab and utilizing anterolateral approach, patient's left knee space was injected with 4:1  marcaine 0.5%: Kenalog 40mg /dL. Patient tolerated the procedure well without immediate complications.      Impression and Recommendations:     This case required medical decision making of moderate complexity.

## 2015-11-23 NOTE — Assessment & Plan Note (Signed)
Patient responded very well to injection today. We discussed icing regimen. Patient is having swelling of multiple different area she states and has had difficulty stenosing improvement. But on a very low dose of allopurinol which will think will be beneficial.kidney functions have been stable. Patient and will come back and see me again in 4 weeks. She would be a candidate for viscous supplementation if necessary. Patient will also be fitted for a stability brace.  Spent  25 minutes with patient face-to-face and had greater than 50% of counseling including as described above in assessment and plan.

## 2015-12-01 ENCOUNTER — Ambulatory Visit: Payer: Commercial Managed Care - HMO | Admitting: Internal Medicine

## 2015-12-07 ENCOUNTER — Ambulatory Visit: Payer: Commercial Managed Care - HMO | Admitting: Internal Medicine

## 2015-12-09 ENCOUNTER — Ambulatory Visit: Payer: Commercial Managed Care - HMO | Admitting: Family

## 2015-12-09 ENCOUNTER — Observation Stay (HOSPITAL_COMMUNITY)
Admission: EM | Admit: 2015-12-09 | Discharge: 2015-12-09 | Disposition: A | Discharge status: Home / Self Care | Payer: Commercial Managed Care - HMO | Attending: Internal Medicine | Admitting: Internal Medicine

## 2015-12-09 ENCOUNTER — Emergency Department (HOSPITAL_COMMUNITY): Payer: Commercial Managed Care - HMO

## 2015-12-09 ENCOUNTER — Encounter (HOSPITAL_COMMUNITY): Payer: Self-pay | Admitting: *Deleted

## 2015-12-09 DIAGNOSIS — M79671 Pain in right foot: Secondary | ICD-10-CM | POA: Insufficient documentation

## 2015-12-09 DIAGNOSIS — Z79899 Other long term (current) drug therapy: Secondary | ICD-10-CM | POA: Diagnosis not present

## 2015-12-09 DIAGNOSIS — I509 Heart failure, unspecified: Secondary | ICD-10-CM | POA: Diagnosis not present

## 2015-12-09 DIAGNOSIS — M1990 Unspecified osteoarthritis, unspecified site: Secondary | ICD-10-CM | POA: Insufficient documentation

## 2015-12-09 DIAGNOSIS — I5032 Chronic diastolic (congestive) heart failure: Secondary | ICD-10-CM

## 2015-12-09 DIAGNOSIS — K219 Gastro-esophageal reflux disease without esophagitis: Secondary | ICD-10-CM | POA: Diagnosis not present

## 2015-12-09 DIAGNOSIS — E669 Obesity, unspecified: Secondary | ICD-10-CM | POA: Insufficient documentation

## 2015-12-09 DIAGNOSIS — Z7982 Long term (current) use of aspirin: Secondary | ICD-10-CM | POA: Diagnosis not present

## 2015-12-09 DIAGNOSIS — I5033 Acute on chronic diastolic (congestive) heart failure: Secondary | ICD-10-CM | POA: Diagnosis not present

## 2015-12-09 DIAGNOSIS — R51 Headache: Secondary | ICD-10-CM | POA: Diagnosis not present

## 2015-12-09 DIAGNOSIS — Z6839 Body mass index (BMI) 39.0-39.9, adult: Secondary | ICD-10-CM | POA: Insufficient documentation

## 2015-12-09 DIAGNOSIS — Z9114 Patient's other noncompliance with medication regimen: Secondary | ICD-10-CM | POA: Insufficient documentation

## 2015-12-09 DIAGNOSIS — E11319 Type 2 diabetes mellitus with unspecified diabetic retinopathy without macular edema: Secondary | ICD-10-CM | POA: Diagnosis not present

## 2015-12-09 DIAGNOSIS — I421 Obstructive hypertrophic cardiomyopathy: Secondary | ICD-10-CM | POA: Diagnosis not present

## 2015-12-09 DIAGNOSIS — Z96651 Presence of right artificial knee joint: Secondary | ICD-10-CM | POA: Diagnosis not present

## 2015-12-09 DIAGNOSIS — Z794 Long term (current) use of insulin: Secondary | ICD-10-CM | POA: Insufficient documentation

## 2015-12-09 DIAGNOSIS — Z8673 Personal history of transient ischemic attack (TIA), and cerebral infarction without residual deficits: Secondary | ICD-10-CM | POA: Insufficient documentation

## 2015-12-09 DIAGNOSIS — M79672 Pain in left foot: Secondary | ICD-10-CM | POA: Diagnosis not present

## 2015-12-09 DIAGNOSIS — I872 Venous insufficiency (chronic) (peripheral): Secondary | ICD-10-CM | POA: Diagnosis not present

## 2015-12-09 DIAGNOSIS — E785 Hyperlipidemia, unspecified: Secondary | ICD-10-CM | POA: Diagnosis not present

## 2015-12-09 DIAGNOSIS — R21 Rash and other nonspecific skin eruption: Secondary | ICD-10-CM | POA: Diagnosis not present

## 2015-12-09 DIAGNOSIS — J4530 Mild persistent asthma, uncomplicated: Secondary | ICD-10-CM | POA: Insufficient documentation

## 2015-12-09 DIAGNOSIS — M7022 Olecranon bursitis, left elbow: Secondary | ICD-10-CM | POA: Insufficient documentation

## 2015-12-09 DIAGNOSIS — R519 Headache, unspecified: Secondary | ICD-10-CM | POA: Diagnosis present

## 2015-12-09 DIAGNOSIS — S5002XA Contusion of left elbow, initial encounter: Secondary | ICD-10-CM | POA: Diagnosis not present

## 2015-12-09 DIAGNOSIS — I110 Hypertensive heart disease with heart failure: Secondary | ICD-10-CM | POA: Diagnosis not present

## 2015-12-09 DIAGNOSIS — Z8541 Personal history of malignant neoplasm of cervix uteri: Secondary | ICD-10-CM | POA: Diagnosis not present

## 2015-12-09 DIAGNOSIS — Z7689 Persons encountering health services in other specified circumstances: Secondary | ICD-10-CM

## 2015-12-09 DIAGNOSIS — R0602 Shortness of breath: Secondary | ICD-10-CM | POA: Diagnosis not present

## 2015-12-09 DIAGNOSIS — M719 Bursopathy, unspecified: Secondary | ICD-10-CM

## 2015-12-09 HISTORY — DX: Malignant neoplasm of cervix uteri, unspecified: C53.9

## 2015-12-09 HISTORY — DX: Type 2 diabetes mellitus with unspecified diabetic retinopathy without macular edema: E11.319

## 2015-12-09 LAB — I-STAT TROPONIN, ED: TROPONIN I, POC: 0.01 ng/mL (ref 0.00–0.08)

## 2015-12-09 LAB — CBC
HEMATOCRIT: 37.7 % (ref 36.0–46.0)
HEMOGLOBIN: 12 g/dL (ref 12.0–15.0)
MCH: 26.2 pg (ref 26.0–34.0)
MCHC: 31.8 g/dL (ref 30.0–36.0)
MCV: 82.3 fL (ref 78.0–100.0)
Platelets: 222 10*3/uL (ref 150–400)
RBC: 4.58 MIL/uL (ref 3.87–5.11)
RDW: 16.2 % — ABNORMAL HIGH (ref 11.5–15.5)
WBC: 5.8 10*3/uL (ref 4.0–10.5)

## 2015-12-09 LAB — BASIC METABOLIC PANEL
ANION GAP: 6 (ref 5–15)
BUN: 15 mg/dL (ref 6–20)
CO2: 27 mmol/L (ref 22–32)
Calcium: 9.2 mg/dL (ref 8.9–10.3)
Chloride: 105 mmol/L (ref 101–111)
Creatinine, Ser: 0.85 mg/dL (ref 0.44–1.00)
GFR calc non Af Amer: >60 mL/min (ref >60)
GLUCOSE: 95 mg/dL (ref 65–99)
POTASSIUM: 4.1 mmol/L (ref 3.5–5.1)
Sodium: 138 mmol/L (ref 135–145)

## 2015-12-09 LAB — BRAIN NATRIURETIC PEPTIDE: B Natriuretic Peptide: 158.3 pg/mL — ABNORMAL HIGH (ref 0.0–100.0)

## 2015-12-09 MED ORDER — ALBUTEROL SULFATE (2.5 MG/3ML) 0.083% IN NEBU
5.0000 mg | INHALATION_SOLUTION | Freq: Once | RESPIRATORY_TRACT | Status: AC
  Administered 2015-12-09: 5 mg via RESPIRATORY_TRACT
  Filled 2015-12-09: qty 6

## 2015-12-09 MED ORDER — FUROSEMIDE 10 MG/ML IJ SOLN
40.0000 mg | Freq: Once | INTRAMUSCULAR | Status: AC
  Administered 2015-12-09: 40 mg via INTRAVENOUS
  Filled 2015-12-09: qty 4

## 2015-12-09 MED ORDER — IBUPROFEN 800 MG PO TABS: 800.0000 mg | ORAL_TABLET | Freq: Three times a day (TID) | ORAL | 0 refills | Status: DC

## 2015-12-09 NOTE — ED Provider Notes (Signed)
[facility] DEPT Provider Note   CSN: [id number] Arrival date & time: 12/09/15  1237  First Provider Contact:  First MD Initiated Contact with Patient 12/09/15 1435        History   Chief Complaint Chief Complaint  Patient presents with  . Shortness of Breath  . Hypertension    HPI Alyssa Ingram is a 70 y.o. female.  The history is provided by the patient. No language interpreter was used.  Shortness of Breath  Associated symptoms include shortness of breath.  Hypertension  Associated symptoms include shortness of breath.    Alyssa Ingram is a 70 y.o. female who presents to the Emergency Department complaining of multiple complaints. She presents for left elbow pain, shortness of breath, leg swelling. She reports progressive bilateral lower extremity swelling for several months. This swelling is waxing and waning. She does state that she is not been compliant with her Lasix and usually takes less than what is prescribes. She also endorses several weeks of shortness of breath, worse over the last 2 days. She has chest pain with coughing. She denies any fevers but does not take her temperature. She also reports left elbow pain for the last month that is worse with movement of the arm. It takes her a long time to get into bed because of the swelling in her elbow.   Past Medical History:  Diagnosis Date  . Abnormal MRI, spine 11/2011   ?discitis L5-S1, s/p CT bx 12/12/11  . Allergic rhinitis, cause unspecified 01/31/2013  . Arthritis   . Asthma   . Cervical cancer ([place])   . Chronic diastolic heart failure ([place])   . Diabetic retinopathy ([place])   . GERD (gastroesophageal reflux disease)   . H/O cardiovascular stress test    Nuclear study in 2011 normal perfusion  . HOCM (hypertrophic obstructive cardiomyopathy) ([place])    Echo 12/19/11: Severe LVH, EF 55-65%, dynamic obstruction, wall motion normal, grade 1 diastolic dysfunction, systolic anterior motion of the mitral valve  with mild MS and mild MR, mild LAE, PASP 34  . Hyperlipidemia   . Hypertension   . Kidney stones   . Obesity   . Type II or unspecified type diabetes mellitus without mention of complication, uncontrolled 01/31/2013    Patient Active Problem List   Diagnosis Date Noted  . Headache 12/09/2015  . Degenerative arthritis of left knee 11/23/2015  . Acute renal failure syndrome ([place]) 02/04/2015  . Bilateral foot pain 01/26/2015  . History of knee replacement procedure of right knee 08/06/2014  . Arthritis of left lower extremity 08/06/2014  . Cellulitis of leg, left 04/14/2014  . Acute bronchitis 04/14/2014  . Chronic venous insufficiency 04/14/2014  . Left leg pain 03/12/2014  . Patellar tendonitis of left knee 07/31/2013  . Superficial phlebitis 01/31/2013  . Chronic pain of right knee 01/31/2013  . Diabetes ([place]) 01/31/2013  . Allergic rhinitis 01/31/2013  . CVA (cerebral vascular accident) ([place]) 05/03/2012  . Hypertrophic obstructive cardiomyopathy ([place]) 05/03/2012  . Left lumbar radiculopathy 11/22/2011  . Right knee pain 09/13/2011  . Cough 09/13/2011  . Arthritis   . Mild persistent asthma   . Cervical cancer ([place])   . GERD (gastroesophageal reflux disease)   . Hypertension   . Kidney stones   . CHF (congestive heart failure) ([place])   . Preventative health care 09/10/2011    Past Surgical History:  Procedure Laterality Date  . ABDOMINAL HYSTERECTOMY    . JOINT REPLACEMENT  right knee 2006  . rotater cuff     right, 2005    OB History    No data available       Home Medications    Prior to Admission medications   Medication Sig Start Date End Date Taking? Authorizing Provider  albuterol (VENTOLIN HFA) 108 (90 BASE) MCG/ACT inhaler Inhale 2 puffs into the lungs 2 (two) times daily. Patient taking differently: Inhale 2 puffs into the lungs every 6 (six) hours as needed for wheezing or shortness of breath.  10/07/14  Yes [name], MD  aspirin 81 MG tablet  Take 162 mg by mouth daily.    Yes Historical Provider, MD  Blood Glucose Monitoring Suppl (ONETOUCH VERIO IQ SYSTEM) W/DEVICE KIT Use to check blood sugars daily E11.9 06/12/14  Yes [name], MD  cetirizine (ZYRTEC) 10 MG tablet TAKE 1 TABLET (10 MG TOTAL) BY MOUTH DAILY. Patient taking differently: TAKE 1 TABLET (10 MG TOTAL) BY MOUTH DAILY AS NEEDED FOR ALLERGIES 08/03/15  Yes [name], MD  diltiazem (CARDIZEM CD) 180 MG 24 hr capsule Take 1 capsule (180 mg total) by mouth daily. 02/04/15  Yes [name], MD  furosemide (LASIX) 40 MG tablet TAKE 1 AND 1/2 TABLETS TWICE DAILY Patient taking differently: TAKE 60 MG BY MOUTH TWICE DAILY AS NEEDED FOR FLUID/SWELLING 04/27/15  Yes [name], MD  gabapentin (NEURONTIN) 300 MG capsule 1-2 tabs by mouth at bedtime Patient taking differently: Take 300-600 mg by mouth at bedtime as needed (PAIN).  01/26/15  Yes [name], MD  glucose blood [facility] VERIO) test strip 1 each by Other route 3 (three) times daily. Use to check blood sugars three times a day Dx E11.9 01/27/15  Yes [name], MD  glucose blood test strip 1 each by Other route 3 (three) times daily. Use to check blood sugars twice a day E11.9 01/22/15  Yes [name], MD  meclizine (ANTIVERT) 25 MG tablet TAKE 1 TABLET (25 MG TOTAL) BY MOUTH 3 (THREE) TIMES DAILY. Patient taking differently: TAKE 1 TABLET (25 MG TOTAL) BY MOUTH 3 (THREE) TIMES DAILY AS NEEDED FOR DIZZINESS 06/10/15  Yes [name], MD  metFORMIN (GLUCOPHAGE) 500 MG tablet TAKE 1 TABLET BY MOUTH TWICE A DAY WITH MEALS Patient taking differently: TAKE 500 MG BY MOUTH TWICE A DAY WITH MEALS 05/31/15  Yes [name], MD  metoprolol (LOPRESSOR) 50 MG tablet Take 1 tablet (50 mg total) by mouth 2 (two) times daily. 06/15/14  Yes [name], MD  [facility] DELICA LANCETS 83G MISC 1 each by Other route daily. Use as directed to test blood sugar daily dx 250.00 06/12/14  Yes [name], MD  pantoprazole (PROTONIX) 40 MG  tablet Take 1 tablet (40 mg total) by mouth daily. Patient taking differently: Take 40 mg by mouth daily as needed (acid reflux).  08/28/14  Yes [name], MD  triamcinolone (NASACORT AQ) 55 MCG/ACT AERO nasal inhaler Place 2 sprays into the nose daily. Patient taking differently: Place 2 sprays into the nose daily as needed (congestion).  07/27/15  Yes [name], MD  allopurinol (ZYLOPRIM) 100 MG tablet Take 1 tablet (100 mg total) by mouth daily. Patient not taking: Reported on 12/09/2015 11/23/15   Lyndal Pulley, DO  budesonide-formoterol [facility]) 160-4.5 MCG/ACT inhaler Inhale 2 puffs into the lungs 2 (two) times daily. Patient not taking: Reported on 12/09/2015 07/27/15   [name],  MD  cyclobenzaprine (FLEXERIL) 10 MG tablet Take 1 tablet (10 mg total) by mouth 3 (three) times daily as needed for muscle spasms. Patient not taking: Reported on 12/09/2015 01/31/13   [name], MD  ibuprofen (ADVIL,MOTRIN) 800 MG tablet Take 1 tablet (800 mg total) by mouth 3 (three) times daily. 12/09/15   Courteney Lyn Mackuen, MD  rosuvastatin (CRESTOR) 20 MG tablet Take 1 tablet (20 mg total) by mouth daily. Patient not taking: Reported on 12/09/2015 07/28/15   [name], MD    Family History Family History  Problem Relation Age of Onset  . Heart disease Father   . Arthritis Other   . Heart disease Other   . Hypertension Other   . Diabetes Other   . Alcohol abuse Other   . Arthritis Other   . Cancer Other     Lung Cancer  . Heart disease Other   . Hypertension Other   . Sudden death Other   . Kidney disease Other   . Mental illness Other   . Diabetes Other   . Colon cancer Neg Hx     Social History Social History  Substance Use Topics  . Smoking status: Never Smoker  . Smokeless tobacco: Never Used  . Alcohol use No     Allergies   Sulfa antibiotics and Other   Review of Systems Review of Systems  Respiratory: Positive for shortness of breath.   All other systems  reviewed and are negative.    Physical Exam Updated Vital Signs BP 173/96   Pulse (!) 55   Temp 97.8 F (36.6 C) (Oral)   Resp 17   Ht 5' (1.524 m)   Wt 200 lb (90.7 kg)   SpO2 100%   BMI 39.06 kg/m   Physical Exam  Constitutional: She is oriented to person, place, and time. She appears well-developed and well-nourished.  HENT:  Head: Normocephalic and atraumatic.  Cardiovascular: Normal rate and regular rhythm.   No murmur heard. Pulmonary/Chest: Effort normal and breath sounds normal. No respiratory distress.  Abdominal: Soft. There is no tenderness. There is no rebound and no guarding.  Musculoskeletal: She exhibits no tenderness.  3+ pitting edema of bilateral lower extremities. There are scattered vesicles on bilateral feet with clear fluid on a pink base. No cellulitis, no abscess.  Left elbow with large, mildly tender bursa.  Flexion/extension intact at the elbow.    Neurological: She is alert and oriented to person, place, and time.  Skin: Skin is warm and dry.  Psychiatric: She has a normal mood and affect. Her behavior is normal.  Nursing note and vitals reviewed.    ED Treatments / Results  Labs (all labs ordered are listed, but only abnormal results are displayed) Labs Reviewed  CBC - Abnormal; Notable for the following:       Result Value   RDW 16.2 (*)    All other components within normal limits  BRAIN NATRIURETIC PEPTIDE - Abnormal; Notable for the following:    B Natriuretic Peptide 158.3 (*)    All other components within normal limits  BASIC METABOLIC PANEL  I-STAT TROPOININ, ED    EKG  EKG Interpretation  Date/Time:  Thursday December 09 2015 13:21:57 EDT Ventricular Rate:  56 PR Interval:    QRS Duration: 88 QT Interval:  484 QTC Calculation: 468 R Axis:   14 Text Interpretation:  Sinus rhythm Probable left atrial enlargement LVH with secondary repolarization abnormality Confirmed by Hazle Coca [phone]) on  12/09/2015 2:31:46 PM        Radiology Dg Chest 2 View  Result Date: 12/09/2015 CLINICAL DATA:  Shortness of breath for 1 week. EXAM: CHEST  2 VIEW COMPARISON:  09/13/2011 FINDINGS: Cardiomegaly. Mild vascular congestion. No overt edema. No confluent opacities or effusions. No acute bony abnormality. IMPRESSION: Cardiomegaly with vascular congestion. Electronically Signed   By: Rolm Baptise M.D.   On: 12/09/2015 15:43   Procedures Procedures (including critical care time)  Medications Ordered in ED Medications  albuterol (PROVENTIL) (2.5 MG/3ML) 0.083% nebulizer solution 5 mg (5 mg Nebulization Given 12/09/15 1431)  furosemide (LASIX) injection 40 mg (40 mg Intravenous Given 12/09/15 1649)     Initial Impression / Assessment and Plan / ED Course  I have reviewed the triage vital signs and the nursing notes.  Pertinent labs & imaging results that were available during my care of the patient were reviewed by me and considered in my medical decision making (see chart for details).  Clinical Course    Patient here with multiple complaints but predominantly increased shortness of breath March edema, noncompliant with her Lasix. The presentation is consistent with mild CHF exacerbation with pulmonary edema and considerable bilateral lower extremity edema. Providing Lasix with observation admission for diuresis. In terms of her elbow pain examination is consistent with bursitis, no evidence of infection.   Final Clinical Impressions(s) / ED Diagnoses   Final diagnoses:  Acute on chronic congestive heart failure, unspecified congestive heart failure type (HCC)  Bursitis  Congestive heart failure, unspecified congestive heart failure chronicity, unspecified congestive heart failure type [facility])    New Prescriptions Discharge Medication List as of 12/09/2015  6:12 PM       Quintella Reichert, MD 12/10/15 [phone]

## 2015-12-09 NOTE — ED Triage Notes (Addendum)
Pt reports SOB for a week, rash and blisters on bila feet.  Pt is diabetic.  Pt is A&O x 4.  Has hx of asthma.  Swelling noted to bila swelling, which is not new per pt.

## 2015-12-09 NOTE — H&P (Signed)
History and Physical    Alyssa Ingram QTM:[id number] DOB: Jul 03, 1945 DOA: 12/09/2015  PCP: Cathlean Cower, MD Consultants:  [name] Breed - cardiologist; ophthalmologist Patient coming from: home; lives with son  Chief Complaint: pain in various places  HPI: Alyssa Ingram is a 70 y.o. female with medical history significant of HTN, HLD, DM, grade 1 diastolic dysfunction.  Patient reports headache, facial swelling, in the process of getting knee braces.  Today, her feet have been very swollen, also with little red spots on her feet.  Breathing was off.  L elbow/forearm pain.  Took over 20 minutes to get into bed.  Breathing problem earlier today but it subsided.  Difficulty lying in any position with arm/elbow hurting.   ED Course: Evaluation of multiple areas of concern; note of non-compliance with medication with possible CHF symptoms so hospitalist called for admission  Review of Systems: As per HPI; otherwise 10 point review of systems reviewed and negative.   Ambulatory Status: Ambulates with cane, mostly  Past Medical History:  Diagnosis Date  . Abnormal MRI, spine 11/2011   ?discitis L5-S1, s/p CT bx 12/12/11  . Allergic rhinitis, cause unspecified 01/31/2013  . Arthritis   . Asthma   . Cervical cancer ([place])   . Chronic diastolic heart failure ([place])   . Diabetic retinopathy ([place])   . GERD (gastroesophageal reflux disease)   . H/O cardiovascular stress test    Nuclear study in 2011 normal perfusion  . HOCM (hypertrophic obstructive cardiomyopathy) ([place])    Echo 12/19/11: Severe LVH, EF 55-65%, dynamic obstruction, wall motion normal, grade 1 diastolic dysfunction, systolic anterior motion of the mitral valve with mild MS and mild MR, mild LAE, PASP 34  . Hyperlipidemia   . Hypertension   . Kidney stones   . Obesity   . Type II or unspecified type diabetes mellitus without mention of complication, uncontrolled 01/31/2013    Past Surgical History:  Procedure Laterality Date  .  ABDOMINAL HYSTERECTOMY    . JOINT REPLACEMENT     right knee 2006  . rotater cuff     right, 2005    Social History   Social History  . Marital status: Widowed    Spouse name: N/A  . Number of children: 5  . Years of education: 14   Occupational History  . Retired Optometrist    Social History Main Topics  . Smoking status: Never Smoker  . Smokeless tobacco: Never Used  . Alcohol use No  . Drug use: No  . Sexual activity: Not on file   Other Topics Concern  . Not on file   Social History Narrative  . No narrative on file    Allergies  Allergen Reactions  . Sulfa Antibiotics Hives  . Other Hives    Unknown drug    Family History  Problem Relation Age of Onset  . Heart disease Father   . Arthritis Other   . Heart disease Other   . Hypertension Other   . Diabetes Other   . Alcohol abuse Other   . Arthritis Other   . Cancer Other     Lung Cancer  . Heart disease Other   . Hypertension Other   . Sudden death Other   . Kidney disease Other   . Mental illness Other   . Diabetes Other   . Colon cancer Neg Hx     Prior to Admission medications   Medication Sig Start Date End Date Taking? Authorizing Provider  albuterol (  VENTOLIN HFA) 108 (90 BASE) MCG/ACT inhaler Inhale 2 puffs into the lungs 2 (two) times daily. Patient taking differently: Inhale 2 puffs into the lungs every 6 (six) hours as needed for wheezing or shortness of breath.  10/07/14  Yes [name], MD  aspirin 81 MG tablet Take 162 mg by mouth daily.    Yes Historical Provider, MD  Blood Glucose Monitoring Suppl (ONETOUCH VERIO IQ SYSTEM) W/DEVICE KIT Use to check blood sugars daily E11.9 06/12/14  Yes [name], MD  cetirizine (ZYRTEC) 10 MG tablet TAKE 1 TABLET (10 MG TOTAL) BY MOUTH DAILY. Patient taking differently: TAKE 1 TABLET (10 MG TOTAL) BY MOUTH DAILY AS NEEDED FOR ALLERGIES 08/03/15  Yes [name], MD  diltiazem (CARDIZEM CD) 180 MG 24 hr capsule Take 1 capsule (180 mg total) by  mouth daily. 02/04/15  Yes [name], MD  furosemide (LASIX) 40 MG tablet TAKE 1 AND 1/2 TABLETS TWICE DAILY Patient taking differently: TAKE 60 MG BY MOUTH TWICE DAILY AS NEEDED FOR FLUID/SWELLING 04/27/15  Yes [name], MD  gabapentin (NEURONTIN) 300 MG capsule 1-2 tabs by mouth at bedtime Patient taking differently: Take 300-600 mg by mouth at bedtime as needed (PAIN).  01/26/15  Yes [name], MD  glucose blood [facility] VERIO) test strip 1 each by Other route 3 (three) times daily. Use to check blood sugars three times a day Dx E11.9 01/27/15  Yes [name], MD  glucose blood test strip 1 each by Other route 3 (three) times daily. Use to check blood sugars twice a day E11.9 01/22/15  Yes [name], MD  ibuprofen (ADVIL,MOTRIN) 200 MG tablet Take 400 mg by mouth every 6 (six) hours as needed for headache, mild pain or moderate pain.   Yes Historical Provider, MD  meclizine (ANTIVERT) 25 MG tablet TAKE 1 TABLET (25 MG TOTAL) BY MOUTH 3 (THREE) TIMES DAILY. Patient taking differently: TAKE 1 TABLET (25 MG TOTAL) BY MOUTH 3 (THREE) TIMES DAILY AS NEEDED FOR DIZZINESS 06/10/15  Yes [name], MD  metFORMIN (GLUCOPHAGE) 500 MG tablet TAKE 1 TABLET BY MOUTH TWICE A DAY WITH MEALS Patient taking differently: TAKE 500 MG BY MOUTH TWICE A DAY WITH MEALS 05/31/15  Yes [name], MD  metoprolol (LOPRESSOR) 50 MG tablet Take 1 tablet (50 mg total) by mouth 2 (two) times daily. 06/15/14  Yes [name], MD  [facility] DELICA LANCETS 69C MISC 1 each by Other route daily. Use as directed to test blood sugar daily dx 250.00 06/12/14  Yes [name], MD  pantoprazole (PROTONIX) 40 MG tablet Take 1 tablet (40 mg total) by mouth daily. Patient taking differently: Take 40 mg by mouth daily as needed (acid reflux).  08/28/14  Yes [name], MD  triamcinolone (NASACORT AQ) 55 MCG/ACT AERO nasal inhaler Place 2 sprays into the nose daily. Patient taking differently: Place 2 sprays into the nose daily  as needed (congestion).  07/27/15  Yes [name], MD  allopurinol (ZYLOPRIM) 100 MG tablet Take 1 tablet (100 mg total) by mouth daily. Patient not taking: Reported on 12/09/2015 11/23/15   Lyndal Pulley, DO  budesonide-formoterol [facility]) 160-4.5 MCG/ACT inhaler Inhale 2 puffs into the lungs 2 (two) times daily. Patient not taking: Reported on 12/09/2015 07/27/15   [name], MD  cyclobenzaprine (FLEXERIL) 10 MG tablet Take 1 tablet (10 mg total) by mouth 3 (three) times daily as needed for  muscle spasms. Patient not taking: Reported on 12/09/2015 01/31/13   [name], MD  rosuvastatin (CRESTOR) 20 MG tablet Take 1 tablet (20 mg total) by mouth daily. Patient not taking: Reported on 12/09/2015 07/28/15   [name], MD    Physical Exam: Vitals:   12/09/15 1453 12/09/15 1455 12/09/15 1711 12/09/15 1818  BP:  180/79 154/91 173/96  Pulse:  64 66 (!) 55  Resp:  '18 18 17  ' Temp:  97.8 F (36.6 C)    TempSrc:  Oral    SpO2: 98% 98% 100% 100%  Weight:      Height:         General:  Appears calm and comfortable and is NAD; morbidly obese, sitting in wheelchair instead of bed Eyes:  PERRL, EOMI, normal lids, iris ENT:  grossly normal hearing, lips & tongue, mmm Neck:  no LAD, masses or thyromegaly Cardiovascular:  RRR, no m/r/g. No LE edema.  Respiratory:  CTA bilaterally, no w/r/r. Normal respiratory effort. Abdomen:  soft, ntnd, NABS Skin:  Scattered small vesicles on bilateral feet without surrounding erythema Musculoskeletal: morbidly obese, venous stasis, chronic 3+ pitting LE edema Psychiatric:  grossly normal mood and affect, speech fluent and appropriate, AOx3 Neurologic:  CN 2-12 grossly intact, moves all extremities in coordinated fashion, sensation intact  Labs on Admission: I have personally reviewed following labs and imaging studies  CBC:  Recent Labs Lab 12/09/15 1309  WBC 5.8  HGB 12.0  HCT 37.7  MCV 82.3  PLT 389   Basic Metabolic Panel:  Recent  Labs Lab 12/09/15 1309  NA 138  K 4.1  CL 105  CO2 27  GLUCOSE 95  BUN 15  CREATININE 0.85  CALCIUM 9.2   GFR: Estimated Creatinine Clearance: 61.8 mL/min (by C-G formula based on SCr of 0.85 mg/dL). Liver Function Tests: No results for input(s): AST, ALT, ALKPHOS, BILITOT, PROT, ALBUMIN in the last 168 hours. No results for input(s): LIPASE, AMYLASE in the last 168 hours. No results for input(s): AMMONIA in the last 168 hours. Coagulation Profile: No results for input(s): INR, PROTIME in the last 168 hours. Cardiac Enzymes: No results for input(s): CKTOTAL, CKMB, CKMBINDEX, TROPONINI in the last 168 hours. BNP (last 3 results) No results for input(s): PROBNP in the last 8760 hours. HbA1C: No results for input(s): HGBA1C in the last 72 hours. CBG: No results for input(s): GLUCAP in the last 168 hours. Lipid Profile: No results for input(s): CHOL, HDL, LDLCALC, TRIG, CHOLHDL, LDLDIRECT in the last 72 hours. Thyroid Function Tests: No results for input(s): TSH, T4TOTAL, FREET4, T3FREE, THYROIDAB in the last 72 hours. Anemia Panel: No results for input(s): VITAMINB12, FOLATE, FERRITIN, TIBC, IRON, RETICCTPCT in the last 72 hours. Urine analysis:    Component Value Date/Time   COLORURINE YELLOW 07/07/2014 1215   APPEARANCEUR CLEAR 07/07/2014 1215   LABSPEC 1.015 07/07/2014 1215   PHURINE 6.0 07/07/2014 1215   GLUCOSEU NEGATIVE 07/07/2014 1215   HGBUR NEGATIVE 07/07/2014 1215   BILIRUBINUR NEGATIVE 07/07/2014 1215   KETONESUR NEGATIVE 07/07/2014 1215   UROBILINOGEN 0.2 07/07/2014 1215   NITRITE NEGATIVE 07/07/2014 1215   LEUKOCYTESUR NEGATIVE 07/07/2014 1215    Creatinine Clearance: Estimated Creatinine Clearance: 61.8 mL/min (by C-G formula based on SCr of 0.85 mg/dL).  Sepsis Labs: '@LABRCNTIP' (procalcitonin:4,lacticidven:4) )No results found for this or any previous visit (from the past 240 hour(s)).   Radiological Exams on Admission: Dg Chest 2 View  Result  Date: 12/09/2015 CLINICAL DATA:  Shortness of breath  for 1 week. EXAM: CHEST  2 VIEW COMPARISON:  09/13/2011 FINDINGS: Cardiomegaly. Mild vascular congestion. No overt edema. No confluent opacities or effusions. No acute bony abnormality. IMPRESSION: Cardiomegaly with vascular congestion. Electronically Signed   By: Rolm Baptise M.D.   On: 12/09/2015 15:43   EKG: Independently reviewed.  NSR with rate 56;  no evidence of acute ischemia  Assessment/Plan Principal Problem:   CHF (congestive heart failure) (HCC) Active Problems:   Arthritis   Chronic venous insufficiency   Bilateral foot pain   Headache    CHF -2/17 Echo showed EF 85% with grade 1 diastolic dysfunction -Current complaints include difficulty with breathing, but this is far down patient's list of complaints -She acknowledges non-compliance, only taking Lasix once every few days because she doesn't think it works -CXR and BNP reassuring, no O2 requirement -Patient appears to be safe for discharge from this perspective -Recommend at least once daily Lasix dosing until PCP follow-up  -Recommend elevation of legs above the level of the heart while at rest (suspect that she does a great deal of sitting while at home)  -Other medical problems - headache, bursitis of elbow (diagnosed by EDP), foot rash also do not require inpatient evaluation and management at this time. -Flow manager notified of decision to discharge patient -Dr. Ralene Bathe was gone by the time the evaluation was complete and so Dr. Julious Oka was notified of plan for discharge and recommendations.  Karmen Bongo MD [facility]  If 7PM-7AM, please contact night-coverage [email or web address] Password [facility]  12/09/2015, 7:03 PM

## 2015-12-09 NOTE — Progress Notes (Signed)
[facility] consulted for pharmacies that deliver.  EDCM went to speak to patient at bedside, however two nurses at bedside.

## 2015-12-09 NOTE — ED Provider Notes (Addendum)
Patietn initially seen by prior provider accepted as admission, now seen by inpatient team who feels discharge is apporpriate. Will follow their recs including encourage patient to take lasix to every day.  Patient had left elbow pain, normal ROM and PE. ? Bursitis, will treat with short course of ibuprofen. Has stable vital signs on discharge, very well appearing. Seen by inpatient and by case management.    Dirck Butch Julio Alm, MD 12/09/15 [place], MD 12/09/15 [id number]

## 2015-12-09 NOTE — Discharge Instructions (Signed)
Take your lasix EVERY DAY.  You have refills on the persciprtion at your pharmacy.  And take ibuprofen for the next week to see if it helps your elbow pain, it should reduce inflammation, take with food.

## 2015-12-09 NOTE — Progress Notes (Signed)
EDCM spoke to patient at bedside.  Patient reports she lives at home with her son.  She reports her son cooks and cleans for her and takes very good care of her.  Patient also has daughter at bedside.  EDCM asked patient if she would be interested in home health services.  Patient politely refused.  EDCM provided patient with list of home health agencies in [facility] and explained services.  EDCM informed patient that she may call her pcp Dr. Cathlean Cower if she had interest in home health services in the future.  Patient verbalized understanding.  Patient requested list of adult day care centers in [place].  [facility] provided patient with this list.  EDCM also provided patient with printed information for [facility] [place].  EDCM also provided patient with contact information for the [facility], as they have a delivery service.  Patient very thankful for resources.  No further EDCM needs at this time.  Discussed with EDP.

## 2015-12-10 ENCOUNTER — Telehealth: Payer: Self-pay | Admitting: Family

## 2015-12-10 NOTE — Telephone Encounter (Signed)
Patient no showed on 7/27 for an acute visit for breathing issues with Marya Amsler.  Please advise.

## 2015-12-30 ENCOUNTER — Telehealth: Payer: Self-pay | Admitting: Internal Medicine

## 2015-12-30 NOTE — Telephone Encounter (Signed)
Pt request to speak to the assistant concern about medication. Please call her back

## 2015-12-31 NOTE — Telephone Encounter (Signed)
Called unable to reach left message to give [place] a call back

## 2016-01-04 ENCOUNTER — Other Ambulatory Visit: Payer: Self-pay | Admitting: Internal Medicine

## 2016-02-02 ENCOUNTER — Ambulatory Visit: Payer: Commercial Managed Care - HMO | Admitting: Internal Medicine

## 2016-02-08 ENCOUNTER — Telehealth: Payer: Self-pay

## 2016-02-08 MED ORDER — GABAPENTIN 300 MG PO CAPS: ORAL_CAPSULE | ORAL | 5 refills | Status: DC

## 2016-02-08 MED ORDER — FUROSEMIDE 40 MG PO TABS: ORAL_TABLET | ORAL | 2 refills | Status: DC

## 2016-02-08 NOTE — Telephone Encounter (Signed)
Medication refills sent to pharmacy 

## 2016-05-16 ENCOUNTER — Ambulatory Visit: Payer: Commercial Managed Care - HMO | Admitting: Internal Medicine

## 2016-05-23 ENCOUNTER — Ambulatory Visit: Payer: Commercial Managed Care - HMO | Admitting: Family Medicine

## 2016-05-28 NOTE — Progress Notes (Deleted)
[name] [address], [place] [address] Phone: [phone] Subjective:    I'm seeing this patient by the request  of:  Cathlean Cower, MD   CC: bilateral knee pain f/u  [id number]  Alyssa Ingram is a 71 y.o. female coming in with complaint of right knee pain.patient does have past medical history significant for a knee replacement in 2006. She was referred to an orthopedic surgeon for further evaluation. She has not had an appointment yet.  Patient was seen for right knee pain and left knee pain. Was given an injection in the left knee nearly 5 months ago. Patient states  Past Medical History:  Diagnosis Date  . Abnormal MRI, spine 11/2011   ?discitis L5-S1, s/p CT bx 12/12/11  . Allergic rhinitis, cause unspecified 01/31/2013  . Arthritis   . Asthma   . Cervical cancer ([place])   . Chronic diastolic heart failure ([place])   . Diabetic retinopathy ([place])   . GERD (gastroesophageal reflux disease)   . H/O cardiovascular stress test    Nuclear study in 2011 normal perfusion  . HOCM (hypertrophic obstructive cardiomyopathy) ([place])    Echo 12/19/11: Severe LVH, EF 55-65%, dynamic obstruction, wall motion normal, grade 1 diastolic dysfunction, systolic anterior motion of the mitral valve with mild MS and mild MR, mild LAE, PASP 34  . Hyperlipidemia   . Hypertension   . Kidney stones   . Obesity   . Type II or unspecified type diabetes mellitus without mention of complication, uncontrolled 01/31/2013   Past Surgical History:  Procedure Laterality Date  . ABDOMINAL HYSTERECTOMY    . JOINT REPLACEMENT     right knee 2006  . rotater cuff     right, 2005   Social History  Substance Use Topics  . Smoking status: Never Smoker  . Smokeless tobacco: Never Used  . Alcohol use No   Allergies  Allergen Reactions  . Sulfa Antibiotics Hives  . Other Hives    Unknown drug   Family History  Problem Relation Age of Onset  . Heart disease Father   .  Arthritis Other   . Heart disease Other   . Hypertension Other   . Diabetes Other   . Alcohol abuse Other   . Arthritis Other   . Cancer Other     Lung Cancer  . Heart disease Other   . Hypertension Other   . Sudden death Other   . Kidney disease Other   . Mental illness Other   . Diabetes Other   . Colon cancer Neg Hx    . Review of Systems: No headache, visual changes, nausea, vomiting, diarrhea, constipation, dizziness, abdominal pain, skin rash, fevers, chills, night sweats, weight loss, swollen lymph nodes,  chest pain, shortness of breath, mood changes.   Objective  There were no vitals taken for this visit.  General: No apparent distress alert and oriented x3 mood and affect normal, dressed appropriately. obese HEENT: Pupils equal, extraocular movements intact  Respiratory: Patient's speak in full sentences and does not appear short of breath  Cardiovascular: 2+ lower extremity edemaworse than previous exam Symmetric, non tender, no erythema  Skin: Warm dry intact with no signs of infection or rash on extremities or on axial skeleton.  Abdomen: Soft nontender  Neuro: Cranial nerves II through XII are intact, neurovascularly intact in all extremities with 2+ DTRs and 2+ pulses.  Lymph: 2+ pitting edema of the lower extremity bilaterally Gait Antalgic gait.  MSK:  Non tender with full range of motion and good stability and symmetric strength and tone of shoulders, elbows, wrist, hip, and ankles bilaterally.  Right knee exam shows the patient's incision is well-healed with no signs of infection. Patient has almost full extension lacking the last 5 and flexion of 95. Patient does have somewhat of a positive clunk.  No change from previous exam.   Left knee shows significant To try ratio. Instability noted on exam. patient does have severe crepitus with range of motion is lacking the last 10 of extension as well as the last 10 of flexion. Patient is neurovascularly intact  distally. Severe tenderness to palpation over the medial joint line  After informed written and verbal consent, patient was seated on exam table. Left knee was prepped with alcohol swab and utilizing anterolateral approach, patient's left knee space was injected with 4:1  marcaine 0.5%: Kenalog 40mg /dL. Patient tolerated the procedure well without immediate complications.      Impression and Recommendations:     This case required medical decision making of moderate complexity.

## 2016-05-29 ENCOUNTER — Ambulatory Visit: Payer: Commercial Managed Care - HMO | Admitting: Family Medicine

## 2016-06-08 ENCOUNTER — Ambulatory Visit: Payer: Commercial Managed Care - HMO | Admitting: Internal Medicine

## 2016-06-13 ENCOUNTER — Ambulatory Visit: Payer: Commercial Managed Care - HMO | Admitting: Internal Medicine

## 2016-06-13 ENCOUNTER — Ambulatory Visit (INDEPENDENT_AMBULATORY_CARE_PROVIDER_SITE_OTHER): Payer: Medicare HMO | Admitting: Internal Medicine

## 2016-06-13 VITALS — BP 140/80 | HR 74 | Temp 98.3°F | Resp 20

## 2016-06-13 DIAGNOSIS — B349 Viral infection, unspecified: Secondary | ICD-10-CM | POA: Diagnosis not present

## 2016-06-13 DIAGNOSIS — I10 Essential (primary) hypertension: Secondary | ICD-10-CM

## 2016-06-13 DIAGNOSIS — Z0001 Encounter for general adult medical examination with abnormal findings: Secondary | ICD-10-CM

## 2016-06-13 DIAGNOSIS — Z0000 Encounter for general adult medical examination without abnormal findings: Secondary | ICD-10-CM

## 2016-06-13 DIAGNOSIS — M25561 Pain in right knee: Secondary | ICD-10-CM | POA: Diagnosis not present

## 2016-06-13 DIAGNOSIS — E119 Type 2 diabetes mellitus without complications: Secondary | ICD-10-CM

## 2016-06-13 DIAGNOSIS — G8929 Other chronic pain: Secondary | ICD-10-CM

## 2016-06-13 MED ORDER — CETIRIZINE HCL 10 MG PO TABS: ORAL_TABLET | ORAL | 3 refills | Status: DC

## 2016-06-13 MED ORDER — GABAPENTIN 300 MG PO CAPS: ORAL_CAPSULE | ORAL | 5 refills | Status: DC

## 2016-06-13 MED ORDER — FUROSEMIDE 40 MG PO TABS: ORAL_TABLET | ORAL | 11 refills | Status: DC

## 2016-06-13 MED ORDER — GLIPIZIDE ER 2.5 MG PO TB24: 2.5000 mg | ORAL_TABLET | Freq: Every day | ORAL | 3 refills | Status: DC

## 2016-06-13 MED ORDER — DILTIAZEM HCL ER COATED BEADS 180 MG PO CP24: 180.0000 mg | ORAL_CAPSULE | Freq: Every day | ORAL | 3 refills | Status: DC

## 2016-06-13 MED ORDER — METOPROLOL TARTRATE 50 MG PO TABS: 50.0000 mg | ORAL_TABLET | Freq: Two times a day (BID) | ORAL | 3 refills | Status: DC

## 2016-06-13 MED ORDER — PANTOPRAZOLE SODIUM 40 MG PO TBEC: 40.0000 mg | DELAYED_RELEASE_TABLET | Freq: Every day | ORAL | 3 refills | Status: DC

## 2016-06-13 MED ORDER — ALBUTEROL SULFATE HFA 108 (90 BASE) MCG/ACT IN AERS: 2.0000 | INHALATION_SPRAY | Freq: Two times a day (BID) | RESPIRATORY_TRACT | 2 refills | Status: AC

## 2016-06-13 MED ORDER — MECLIZINE HCL 25 MG PO TABS: ORAL_TABLET | ORAL | 2 refills | Status: DC

## 2016-06-13 MED ORDER — TRAMADOL HCL 50 MG PO TABS: 50.0000 mg | ORAL_TABLET | Freq: Three times a day (TID) | ORAL | 1 refills | Status: DC | PRN

## 2016-06-13 MED ORDER — TRIAMCINOLONE ACETONIDE 55 MCG/ACT NA AERO: 2.0000 | INHALATION_SPRAY | Freq: Every day | NASAL | 12 refills | Status: DC

## 2016-06-13 MED ORDER — ALLOPURINOL 100 MG PO TABS: 100.0000 mg | ORAL_TABLET | Freq: Every day | ORAL | 3 refills | Status: DC

## 2016-06-13 MED ORDER — ROSUVASTATIN CALCIUM 20 MG PO TABS: 20.0000 mg | ORAL_TABLET | Freq: Every day | ORAL | 3 refills | Status: DC

## 2016-06-13 MED ORDER — CYCLOBENZAPRINE HCL 10 MG PO TABS: 10.0000 mg | ORAL_TABLET | Freq: Three times a day (TID) | ORAL | 6 refills | Status: DC | PRN

## 2016-06-13 MED ORDER — BUDESONIDE-FORMOTEROL FUMARATE 160-4.5 MCG/ACT IN AERO: 2.0000 | INHALATION_SPRAY | Freq: Two times a day (BID) | RESPIRATORY_TRACT | 3 refills | Status: DC

## 2016-06-13 NOTE — Progress Notes (Signed)
Subjective:    Patient ID: Alyssa Ingram, female    DOB: 25-Jul-1945, 71 y.o.   MRN: [id number]  HPI  Here for wellness and f/u;  Overall doing ok;  Pt denies Chest pain, worsening SOB, DOE, wheezing, orthopnea, PND, worsening LE edema, palpitations, dizziness or syncope.  Pt denies neurological change such as new headache, facial or extremity weakness. Pt states overall good compliance with treatment and medications, good tolerability, and has been trying to follow appropriate diet.  Pt denies worsening depressive symptoms, suicidal ideation or panic. No fever, night sweats, wt loss, loss of appetite, or other constitutional symptoms.  Pt states good ability with ADL's, has low fall risk, home safety reviewed and adequate, no other significant changes in hearing or vision  Declines colonoscopy, has not yet done the cologuard.  Wt Readings from Last 3 Encounters:  12/09/15 200 lb (90.7 kg)  11/23/15 216 lb (98 kg)  07/27/15 230 lb (104.3 kg)   BP Readings from Last 3 Encounters:  06/13/16 140/80  12/09/15 173/96  11/23/15 136/82   Pt denies polydipsia, polyuria, or low sugar symptoms such as weakness or confusion improved with po intake.  Pt states overall good compliance with meds, trying to follow lower cholesterol, diabetic diet, wt overall stable but little exercise however.    Has not taken metformin for several months due to GI upset.  Has ongoing bilat knee pain, needs cortisone but soonest appt is feb 6.  Did also have recent URI symptoms in the past 2 wks but now that she is here, ST has resolved, without fever.  No other new history Past Medical History:  Diagnosis Date  . Abnormal MRI, spine 11/2011   ?discitis L5-S1, s/p CT bx 12/12/11  . Allergic rhinitis, cause unspecified 01/31/2013  . Arthritis   . Asthma   . Cervical cancer ([place])   . Chronic diastolic heart failure ([place])   . Diabetic retinopathy ([place])   . GERD (gastroesophageal reflux disease)   . H/O cardiovascular stress  test    Nuclear study in 2011 normal perfusion  . HOCM (hypertrophic obstructive cardiomyopathy) ([place])    Echo 12/19/11: Severe LVH, EF 55-65%, dynamic obstruction, wall motion normal, grade 1 diastolic dysfunction, systolic anterior motion of the mitral valve with mild MS and mild MR, mild LAE, PASP 34  . Hyperlipidemia   . Hypertension   . Kidney stones   . Obesity   . Type II or unspecified type diabetes mellitus without mention of complication, uncontrolled 01/31/2013   Past Surgical History:  Procedure Laterality Date  . ABDOMINAL HYSTERECTOMY    . JOINT REPLACEMENT     right knee 2006  . rotater cuff     right, 2005    reports that she has never smoked. She has never used smokeless tobacco. She reports that she does not drink alcohol or use drugs. family history includes Alcohol abuse in her other; Arthritis in her other and other; Cancer in her other; Diabetes in her other and other; Heart disease in her father, other, and other; Hypertension in her other and other; Kidney disease in her other; Mental illness in her other; Sudden death in her other. Allergies  Allergen Reactions  . Sulfa Antibiotics Hives  . Metformin And Related   . Other Hives    Unknown drug   Current Outpatient Prescriptions on File Prior to Visit  Medication Sig Dispense Refill  . aspirin 81 MG tablet Take 162 mg by mouth daily.     Marland Kitchen  Blood Glucose Monitoring Suppl (ONETOUCH VERIO IQ SYSTEM) w/Device KIT USE AS DIRECTED TO CHECK BLOOD SUGAR DAILY 1 kit 0  . glucose blood (ONETOUCH VERIO) test strip 1 each by Other route 3 (three) times daily. Use to check blood sugars three times a day Dx E11.9 270 each 3  . glucose blood test strip 1 each by Other route 3 (three) times daily. Use to check blood sugars twice a day E11.9 100 each 11  . ibuprofen (ADVIL,MOTRIN) 800 MG tablet Take 1 tablet (800 mg total) by mouth 3 (three) times daily. 21 tablet 0  . ONETOUCH DELICA LANCETS 33G MISC USE TO CHECK BLOOD SUGAR  ONCE DAILY 100 each 1  . ONETOUCH VERIO test strip USE 2 TIMES DAILY TO CHECK BLOOD SUGARS 200 each 2   No current facility-administered medications on file prior to visit.      Review of Systems Constitutional: Negative for increased diaphoresis, or other activity, appetite or siginficant weight change other than noted HENT: Negative for worsening hearing loss, ear pain, facial swelling, mouth sores and neck stiffness.   Eyes: Negative for other worsening pain, redness or visual disturbance.  Respiratory: Negative for choking or stridor Cardiovascular: Negative for other chest pain and palpitations.  Gastrointestinal: Negative for worsening diarrhea, blood in stool, or abdominal distention Genitourinary: Negative for hematuria, flank pain or change in urine volume.  Musculoskeletal: Negative for myalgias or other joint complaints.  Skin: Negative for other color change and wound or drainage.  Neurological: Negative for syncope and numbness. other than noted Hematological: Negative for adenopathy. or other swelling Psychiatric/Behavioral: Negative for hallucinations, SI, self-injury, decreased concentration or other worsening agitation.  All other system neg per pt    Objective:   Physical Exam BP 140/80   Pulse 74   Temp 98.3 F (36.8 C) (Oral)   Resp 20   SpO2 97%  VS noted, not ill appaering Constitutional: Pt is oriented to person, place, and time. Appears well-developed and well-nourished, in no significant distress Head: Normocephalic and atraumatic  Eyes: Conjunctivae and EOM are normal. Pupils are equal, round, and reactive to light Right Ear: External ear normal.  Left Ear: External ear normal Nose: Nose normal.  Bilat tm's with mild erythema.  Max sinus areas non tender.  Pharynx with mild erythema, no exudate Neck: Normal range of motion. Neck supple. No JVD present. No tracheal deviation present or significant neck LA or mass Cardiovascular: Normal rate, regular  rhythm, normal heart sounds and intact distal pulses.   Pulmonary/Chest: Effort normal and breath sounds without rales or wheezing  Abdominal: Soft. Bowel sounds are normal. NT. No HSM  Musculoskeletal: Normal range of motion except for bilat knee with 1+ effusions, decreased ROM, non tender. Exhibits no edema Lymphadenopathy: Has no cervical adenopathy.  Neurological: Pt is alert and oriented to person, place, and time. Pt has normal reflexes. No cranial nerve deficit. Motor grossly intact Skin: Skin is warm and dry. No rash noted or new ulcers Psychiatric:  Has normal mood and affect. Behavior is normal.  No other new exam findings     Assessment & Plan:   

## 2016-06-13 NOTE — Assessment & Plan Note (Signed)
stable overall by history and exam, recent data reviewed with pt, and pt to continue medical treatment as before,  to f/u any worsening symptoms or concerns BP Readings from Last 3 Encounters:  06/13/16 140/80  12/09/15 173/96  11/23/15 136/82

## 2016-06-13 NOTE — Assessment & Plan Note (Signed)

## 2016-06-13 NOTE — Assessment & Plan Note (Addendum)
Mod to severe flare, for tramadol prn, f/u Dr Smith/sport med as planned  In addition to the time spent performing CPE, I spent an additional 25 minutes face to face,in which greater than 50% of this time was spent in counseling and coordination of care for patient's acute illness as documented.

## 2016-06-13 NOTE — Assessment & Plan Note (Addendum)
?   Control, has not taken the metformin for several months, not really watching diet, o/w stable overall by history and exam, recent data reviewed with pt, and pt to start glipizide ER 2.5 qd, for f/u lab today,  to f/u any worsening symptoms or concerns Lab Results  Component Value Date   HGBA1C 6.7 (H) 07/27/2015

## 2016-06-13 NOTE — Assessment & Plan Note (Signed)
Mild, likely viral URI, now improved, no further tx needed, meclizine refilled in case of dizziness

## 2016-06-13 NOTE — Progress Notes (Signed)
Pre visit review using our clinic review tool, if applicable. No additional management support is needed unless otherwise documented below in the visit note. 

## 2016-06-13 NOTE — Patient Instructions (Signed)
Please take all new medication as prescribed - the pain medication, and the glipizide ER 2.5 mg per day  Please continue all other medications as before, and refills have been done if requested - all meds, including meclizine  Please have the pharmacy call with any other refills you may need.  Please continue your efforts at being more active, low cholesterol diet, and weight control.  You are otherwise up to date with prevention measures today.  Please keep your appointments with your specialists as you may have planned - Dr Tamala Julian on Feb 6  Please go to the LAB in the Basement (turn left off the elevator) for the tests to be done at your convenience  You will be contacted by phone if any changes need to be made immediately.  Otherwise, you will receive a letter about your results with an explanation, but please check with MyChart first.  Please remember to sign up for MyChart if you have not done so, as this will be important to you in the future with finding out test results, communicating by private email, and scheduling acute appointments online when needed.  Please return in 6 months, or sooner if needed, with Lab testing done 3-5 days before

## 2016-06-14 ENCOUNTER — Ambulatory Visit: Payer: Commercial Managed Care - HMO | Admitting: Internal Medicine

## 2016-06-16 ENCOUNTER — Telehealth: Payer: Self-pay | Admitting: Internal Medicine

## 2016-06-16 NOTE — Telephone Encounter (Signed)
Pt called in and said that she forgot to ask for something for constipation .  She would like something called in to the pharmacy on file

## 2016-06-17 NOTE — Telephone Encounter (Signed)
Start colace 100 mg which is over the counter - it is a stool softener - she can take 1-3 tabs daily or as needed.   If this is not effective she can take miralax (also over the counter) daily or as needed.   If no improvement she should come in or follow up with Dr Jenny Reichmann

## 2016-06-17 NOTE — Telephone Encounter (Signed)
Patient aware.

## 2016-06-20 ENCOUNTER — Ambulatory Visit: Payer: Commercial Managed Care - HMO | Admitting: Family Medicine

## 2016-06-25 NOTE — Progress Notes (Deleted)
[name] [address] [place], [place] [address] Phone: [phone] Subjective:    I'm seeing this patient by the request  of:  Cathlean Cower, MD   CC: bilateral knee pain f/u  [id number]  Alyssa Ingram is a 71 y.o. female coming in with complaint of right knee pain.patient does have past medical history significant for a knee replacement in 2006. She was referred to an orthopedic surgeon for further evaluation. She has not had an appointment yet.  Patient was seen for right knee pain and left knee pain. Was given an injection in the left knee nearly 7 months ago. Patient states  Past Medical History:  Diagnosis Date  . Abnormal MRI, spine 11/2011   ?discitis L5-S1, s/p CT bx 12/12/11  . Allergic rhinitis, cause unspecified 01/31/2013  . Arthritis   . Asthma   . Cervical cancer ([place])   . Chronic diastolic heart failure ([place])   . Diabetic retinopathy ([place])   . GERD (gastroesophageal reflux disease)   . H/O cardiovascular stress test    Nuclear study in 2011 normal perfusion  . HOCM (hypertrophic obstructive cardiomyopathy) ([place])    Echo 12/19/11: Severe LVH, EF 55-65%, dynamic obstruction, wall motion normal, grade 1 diastolic dysfunction, systolic anterior motion of the mitral valve with mild MS and mild MR, mild LAE, PASP 34  . Hyperlipidemia   . Hypertension   . Kidney stones   . Obesity   . Type II or unspecified type diabetes mellitus without mention of complication, uncontrolled 01/31/2013   Past Surgical History:  Procedure Laterality Date  . ABDOMINAL HYSTERECTOMY    . JOINT REPLACEMENT     right knee 2006  . rotater cuff     right, 2005   Social History  Substance Use Topics  . Smoking status: Never Smoker  . Smokeless tobacco: Never Used  . Alcohol use No   Allergies  Allergen Reactions  . Sulfa Antibiotics Hives  . Metformin And Related   . Other Hives    Unknown drug   Family History  Problem Relation Age of Onset    . Heart disease Father   . Arthritis Other   . Heart disease Other   . Hypertension Other   . Diabetes Other   . Alcohol abuse Other   . Arthritis Other   . Cancer Other     Lung Cancer  . Heart disease Other   . Hypertension Other   . Sudden death Other   . Kidney disease Other   . Mental illness Other   . Diabetes Other   . Colon cancer Neg Hx    . Review of Systems: No headache, visual changes, nausea, vomiting, diarrhea, constipation, dizziness, abdominal pain, skin rash, fevers, chills, night sweats, weight loss, swollen lymph nodes,  chest pain, shortness of breath, mood changes.   Objective  There were no vitals taken for this visit.  General: No apparent distress alert and oriented x3 mood and affect normal, dressed appropriately. obese HEENT: Pupils equal, extraocular movements intact  Respiratory: Patient's speak in full sentences and does not appear short of breath  Cardiovascular: 2+ lower extremity edemaworse than previous exam Symmetric, non tender, no erythema  Skin: Warm dry intact with no signs of infection or rash on extremities or on axial skeleton.  Abdomen: Soft nontender  Neuro: Cranial nerves II through XII are intact, neurovascularly intact in all extremities with 2+ DTRs and 2+ pulses.  Lymph: 2+ pitting edema of  the lower extremity bilaterally Gait Antalgic gait.  MSK:  Non tender with full range of motion and good stability and symmetric strength and tone of shoulders, elbows, wrist, hip, and ankles bilaterally.  Right knee exam shows the patient's incision is well-healed with no signs of infection. Patient has almost full extension lacking the last 5 and flexion of 95. Patient does have somewhat of a positive clunk.  No change from previous exam.   Left knee shows significant To try ratio. Instability noted on exam. patient does have severe crepitus with range of motion is lacking the last 10 of extension as well as the last 10 of flexion. Patient  is neurovascularly intact distally. Severe tenderness to palpation over the medial joint line       Impression and Recommendations:     This case required medical decision making of moderate complexity.

## 2016-06-26 ENCOUNTER — Ambulatory Visit: Payer: Commercial Managed Care - HMO | Admitting: Family Medicine

## 2016-06-27 ENCOUNTER — Telehealth: Payer: Self-pay

## 2016-06-27 MED ORDER — MECLIZINE HCL 25 MG PO TABS: ORAL_TABLET | ORAL | 2 refills | Status: DC

## 2016-06-27 NOTE — Telephone Encounter (Signed)
Patient is requesting refill of meclizine to be sent to [facility]

## 2016-08-03 ENCOUNTER — Ambulatory Visit: Payer: Medicare HMO | Admitting: Internal Medicine

## 2016-08-03 DIAGNOSIS — Z0289 Encounter for other administrative examinations: Secondary | ICD-10-CM

## 2016-08-09 ENCOUNTER — Ambulatory Visit (INDEPENDENT_AMBULATORY_CARE_PROVIDER_SITE_OTHER): Payer: Medicare HMO | Admitting: Internal Medicine

## 2016-08-09 ENCOUNTER — Encounter: Payer: Self-pay | Admitting: Internal Medicine

## 2016-08-09 VITALS — BP 124/70 | HR 80 | Temp 97.9°F | Ht 60.0 in | Wt 226.2 lb

## 2016-08-09 DIAGNOSIS — R6889 Other general symptoms and signs: Secondary | ICD-10-CM | POA: Diagnosis not present

## 2016-08-09 DIAGNOSIS — I5032 Chronic diastolic (congestive) heart failure: Secondary | ICD-10-CM | POA: Diagnosis not present

## 2016-08-09 DIAGNOSIS — J4531 Mild persistent asthma with (acute) exacerbation: Secondary | ICD-10-CM | POA: Diagnosis not present

## 2016-08-09 DIAGNOSIS — R5381 Other malaise: Secondary | ICD-10-CM | POA: Diagnosis not present

## 2016-08-09 MED ORDER — CEFTRIAXONE SODIUM 1 G IJ SOLR
1.0000 g | Freq: Once | INTRAMUSCULAR | Status: AC
Start: 2016-08-09 — End: 2016-08-09
  Administered 2016-08-09: 1 g via INTRAMUSCULAR

## 2016-08-09 MED ORDER — PREDNISONE 10 MG PO TABS: ORAL_TABLET | ORAL | 0 refills | Status: DC

## 2016-08-09 MED ORDER — METHYLPREDNISOLONE ACETATE 80 MG/ML IJ SUSP
80.0000 mg | Freq: Once | INTRAMUSCULAR | Status: AC
  Administered 2016-08-09: 80 mg via INTRAMUSCULAR

## 2016-08-09 MED ORDER — LEVOFLOXACIN 250 MG PO TABS: 250.0000 mg | ORAL_TABLET | Freq: Every day | ORAL | 0 refills | Status: AC

## 2016-08-09 MED ORDER — ALBUTEROL SULFATE 0.63 MG/3ML IN NEBU: 1.0000 | INHALATION_SOLUTION | Freq: Four times a day (QID) | RESPIRATORY_TRACT | 12 refills | Status: DC | PRN

## 2016-08-09 MED ORDER — FUROSEMIDE 40 MG PO TABS: ORAL_TABLET | ORAL | 11 refills | Status: DC

## 2016-08-09 NOTE — Progress Notes (Signed)
Pre visit review using our clinic review tool, if applicable. No additional management support is needed unless otherwise documented below in the visit note. 

## 2016-08-09 NOTE — Patient Instructions (Addendum)
You had the steroid shot today (depomedrol), and the antibiotic shot  OK to increase the lasix to 2 pills twice per day  Please take all new medication as prescribed - the prednisone, and pill antibiotics, as well as the nebulizer medication to use inhaler medications  Please continue all other medications as before, and refills have been done if requested.  Please have the pharmacy call with any other refills you may need.  Please continue your efforts at being more active, low cholesterol diet, and weight control.  Please keep your appointments with your specialists as you may have planned  You will be contacted regarding the referral for: Home Health, as well as [facility]  Please go to the XRAY Department in the Basement (go straight as you get off the elevator) for the x-ray testing at your convenience  Please go to the LAB in the Basement (turn left off the elevator) for the tests to be done at your convenience  You will be contacted by phone if any changes need to be made immediately.  Otherwise, you will receive a letter about your results with an explanation, but please check with MyChart first.  Please remember to sign up for MyChart if you have not done so, as this will be important to you in the future with finding out test results, communicating by private email, and scheduling acute appointments online when needed.  Please return in 2 weeks, or sooner if needed  Please go to the ER if you have any worsening Shortness of breath or weakness or falls

## 2016-08-09 NOTE — Progress Notes (Signed)
Subjective:    Patient ID: Alyssa Ingram, female    DOB: 19-Oct-1945, 71 y.o.   MRN: [id number]  HPI  Here with 3-4 days worsening sob, wheezing and tightness with scant prod cough, assoc with general weakness, very slow gait, using cane to avoid falling, helped her by her son Inhaler helped earlier today, and asks for nebulizer to use at home in addition to her meds.  Has also felt ST, feverish, but not sure how high, and no chills.  Pt denies chest pain, orthopnea, PND, palpitations, dizziness or syncope, but has gained wt though she is not sure her LE swelling is actually worse than usual. Wt Readings from Last 3 Encounters:  08/09/16 226 lb 4 oz (102.6 kg)  12/09/15 200 lb (90.7 kg)  11/23/15 216 lb (98 kg)  Blind left eye, Pt denies new neurological symptoms such as new headache, or facial or extremity weakness or numbness   Pt denies polydipsia, polyuria Past Medical History:  Diagnosis Date  . Abnormal MRI, spine 11/2011   ?discitis L5-S1, s/p CT bx 12/12/11  . Allergic rhinitis, cause unspecified 01/31/2013  . Arthritis   . Asthma   . Cervical cancer ([place])   . Chronic diastolic heart failure ([place])   . Diabetic retinopathy ([place])   . GERD (gastroesophageal reflux disease)   . H/O cardiovascular stress test    Nuclear study in 2011 normal perfusion  . HOCM (hypertrophic obstructive cardiomyopathy) ([place])    Echo 12/19/11: Severe LVH, EF 55-65%, dynamic obstruction, wall motion normal, grade 1 diastolic dysfunction, systolic anterior motion of the mitral valve with mild MS and mild MR, mild LAE, PASP 34  . Hyperlipidemia   . Hypertension   . Kidney stones   . Obesity   . Type II or unspecified type diabetes mellitus without mention of complication, uncontrolled 01/31/2013   Past Surgical History:  Procedure Laterality Date  . ABDOMINAL HYSTERECTOMY    . JOINT REPLACEMENT     right knee 2006  . rotater cuff     right, 2005    reports that she has never smoked. She has never  used smokeless tobacco. She reports that she does not drink alcohol or use drugs. family history includes Alcohol abuse in her other; Arthritis in her other and other; Cancer in her other; Diabetes in her other and other; Heart disease in her father, other, and other; Hypertension in her other and other; Kidney disease in her other; Mental illness in her other; Sudden death in her other. Allergies  Allergen Reactions  . Sulfa Antibiotics Hives  . Metformin And Related   . Other Hives    Unknown drug   Current Outpatient Prescriptions on File Prior to Visit  Medication Sig Dispense Refill  . albuterol (PROAIR HFA) 108 (90 Base) MCG/ACT inhaler Inhale 2 puffs into the lungs 2 (two) times daily. 8.5 Inhaler 2  . allopurinol (ZYLOPRIM) 100 MG tablet Take 1 tablet (100 mg total) by mouth daily. 90 tablet 3  . aspirin 81 MG tablet Take 162 mg by mouth daily.     . Blood Glucose Monitoring Suppl (ONETOUCH VERIO IQ SYSTEM) w/Device KIT USE AS DIRECTED TO CHECK BLOOD SUGAR DAILY 1 kit 0  . budesonide-formoterol (SYMBICORT) 160-4.5 MCG/ACT inhaler Inhale 2 puffs into the lungs 2 (two) times daily. 3 Inhaler 3  . cetirizine (ZYRTEC) 10 MG tablet TAKE 1 TABLET (10 MG TOTAL) BY MOUTH DAILY as needed 90 tablet 3  . cyclobenzaprine (FLEXERIL) 10 MG tablet  Take 1 tablet (10 mg total) by mouth 3 (three) times daily as needed for muscle spasms. 90 tablet 6  . diltiazem (CARDIZEM CD) 180 MG 24 hr capsule Take 1 capsule (180 mg total) by mouth daily. 90 capsule 3  . gabapentin (NEURONTIN) 300 MG capsule 1-2 tabs by mouth at bedtime 60 capsule 5  . glipiZIDE (GLIPIZIDE XL) 2.5 MG 24 hr tablet Take 1 tablet (2.5 mg total) by mouth daily with breakfast. 90 tablet 3  . glucose blood (ONETOUCH VERIO) test strip 1 each by Other route 3 (three) times daily. Use to check blood sugars three times a day Dx E11.9 270 each 3  . glucose blood test strip 1 each by Other route 3 (three) times daily. Use to check blood sugars  twice a day E11.9 100 each 11  . ibuprofen (ADVIL,MOTRIN) 800 MG tablet Take 1 tablet (800 mg total) by mouth 3 (three) times daily. 21 tablet 0  . meclizine (ANTIVERT) 25 MG tablet TAKE 1 TABLET (25 MG TOTAL) BY MOUTH 3 (THREE) TIMES DAILY. 60 tablet 2  . metoprolol (LOPRESSOR) 50 MG tablet Take 1 tablet (50 mg total) by mouth 2 (two) times daily. 180 tablet 3  . ONETOUCH DELICA LANCETS 54G MISC USE TO CHECK BLOOD SUGAR ONCE DAILY 100 each 1  . ONETOUCH VERIO test strip USE 2 TIMES DAILY TO CHECK BLOOD SUGARS 200 each 2  . pantoprazole (PROTONIX) 40 MG tablet Take 1 tablet (40 mg total) by mouth daily. 90 tablet 3  . rosuvastatin (CRESTOR) 20 MG tablet Take 1 tablet (20 mg total) by mouth daily. 90 tablet 3  . traMADol (ULTRAM) 50 MG tablet Take 1 tablet (50 mg total) by mouth every 8 (eight) hours as needed. 60 tablet 1  . triamcinolone (NASACORT AQ) 55 MCG/ACT AERO nasal inhaler Place 2 sprays into the nose daily. 1 Inhaler 12   No current facility-administered medications on file prior to visit.    Review of Systems  Constitutional: Negative for unusual diaphoresis or night sweats HENT: Negative for ear swelling or discharge Eyes: Negative for worsening visual haziness  Respiratory: Negative for choking and stridor.   Gastrointestinal: Negative for distension or worsening eructation Genitourinary: Negative for retention or change in urine volume.  Musculoskeletal: Negative for other MSK pain or swelling Skin: Negative for color change and worsening wound Neurological: Negative for tremors and numbness other than noted  Psychiatric/Behavioral: Negative for decreased concentration or agitation other than above   All other system neg per pt, helped by son    Objective:   Physical Exam  BP 124/70 (BP Location: Right Arm, Patient Position: Sitting, Cuff Size: Large)   Pulse 80   Temp 97.9 F (36.6 C) (Oral)   Ht 5' (1.524 m)   Wt 226 lb 4 oz (102.6 kg)   SpO2 (!) 89%   BMI 44.19  kg/m  VS noted, mild to mod ill appearing, fatigued, general weak Constitutional: Pt appears in no apparent distress HENT: Head: NCAT.  Right Ear: External ear normal.  Left Ear: External ear normal.  Eyes: . Pupils are equal, round, and reactive to light. Conjunctivae and EOM are normal Bilat tm's with mild erythema.  Max sinus areas non tender.  Pharynx with mild erythema, no exudate Neck: Normal range of motion. Neck supple.  Cardiovascular: Normal rate and regular rhythm.   Pulmonary/Chest: Effort normal and breath sounds decresaed without rales but with mild bilat wheezing.  Abd:  Soft, NT, ND, + BS Neurological:  Pt is alert. Not confused , motor grossly intact Skin: Skin is warm. No rash, 2+ leg edema to knees with morbid obesity Psychiatric: Pt behavior is normal. No agitation.  No other exam findings Lab Results  Component Value Date   WBC 5.8 12/09/2015   HGB 12.0 12/09/2015   HCT 37.7 12/09/2015   PLT 222 12/09/2015   GLUCOSE 95 12/09/2015   CHOL 199 07/27/2015   TRIG 85.0 07/27/2015   HDL 56.10 07/27/2015   LDLCALC 126 (H) 07/27/2015   ALT 9 07/27/2015   AST 11 07/27/2015   NA 138 12/09/2015   K 4.1 12/09/2015   CL 105 12/09/2015   CREATININE 0.85 12/09/2015   BUN 15 12/09/2015   CO2 27 12/09/2015   TSH 0.81 07/27/2015   INR 1.03 12/12/2011   HGBA1C 6.7 (H) 07/27/2015   MICROALBUR <0.7 07/07/2014       Assessment & Plan:

## 2016-08-10 DIAGNOSIS — R5381 Other malaise: Secondary | ICD-10-CM | POA: Insufficient documentation

## 2016-08-10 NOTE — Assessment & Plan Note (Addendum)
With mild hypoxia today, d/w pt she should go to ER but she adamantly declines; with fever may be infectious related as well, c/w bronchitis vs pna, today for depomedrol IM 80, rocephin IM 1 gm, predpac asd, antibiotic asd, cough med prn, cont inhalers and add nebulizer prn; she agrees to go to ER for ANY worsening breathing; o/w for cxr and labs in am (both closed now and declines to go to [facility] for these tonight  Note:  Total time for pt hx, exam, review of record with pt in the room, determination of diagnoses and plan for further eval and tx is > 40 min, with over 50% spent in coordination and counseling of patient, regarding exam and counseling of hypoxia, asthma exacerbation, CHF management and need for [facility] services

## 2016-08-10 NOTE — Assessment & Plan Note (Addendum)
With wt gain and increased swelling, for incresaed lasix to 80 bid, f/u with next visit 2 wks, to check home daily wts

## 2016-08-10 NOTE — Assessment & Plan Note (Addendum)
likely acute on chronic, with hypoxia today and should improve some with tx, but will also need HH RN, PT, as well as [facility] referral

## 2016-08-21 ENCOUNTER — Other Ambulatory Visit: Payer: Self-pay

## 2016-08-21 NOTE — Patient Outreach (Signed)
[place] [facility]) Care Management  08/21/2016  SHANAIYA BENE 1945-12-14 [id number]  REFERRAL DATE; 08/21/16  REFERRAL SOURCE; Primary MD REFERRAL REASON; Consult reason, asthma, congestive heart failure  Telephone call to patient regarding primary MD referral. HIPAA compliant voice message left with call back phone number.   PLAN: RNCM will attempt 2nd telephone call to patient within 1 week.   [name] RN,BSN,CCM [facility] Telephonic  [phone]

## 2016-08-23 ENCOUNTER — Ambulatory Visit: Payer: Self-pay

## 2016-08-23 ENCOUNTER — Ambulatory Visit: Payer: Medicare HMO | Admitting: Internal Medicine

## 2016-08-24 ENCOUNTER — Encounter: Payer: Self-pay | Admitting: Family Medicine

## 2016-08-24 ENCOUNTER — Ambulatory Visit (INDEPENDENT_AMBULATORY_CARE_PROVIDER_SITE_OTHER): Payer: Medicare HMO | Admitting: Internal Medicine

## 2016-08-24 ENCOUNTER — Other Ambulatory Visit (INDEPENDENT_AMBULATORY_CARE_PROVIDER_SITE_OTHER): Payer: Medicare HMO

## 2016-08-24 ENCOUNTER — Ambulatory Visit (INDEPENDENT_AMBULATORY_CARE_PROVIDER_SITE_OTHER): Payer: Medicare HMO | Admitting: Family Medicine

## 2016-08-24 ENCOUNTER — Encounter: Payer: Self-pay | Admitting: Internal Medicine

## 2016-08-24 VITALS — BP 126/76 | HR 57 | Temp 97.6°F | Ht 60.0 in | Wt 217.0 lb

## 2016-08-24 DIAGNOSIS — Z0000 Encounter for general adult medical examination without abnormal findings: Secondary | ICD-10-CM

## 2016-08-24 DIAGNOSIS — G8929 Other chronic pain: Secondary | ICD-10-CM

## 2016-08-24 DIAGNOSIS — M25511 Pain in right shoulder: Secondary | ICD-10-CM | POA: Diagnosis not present

## 2016-08-24 DIAGNOSIS — J4531 Mild persistent asthma with (acute) exacerbation: Secondary | ICD-10-CM | POA: Diagnosis not present

## 2016-08-24 DIAGNOSIS — E119 Type 2 diabetes mellitus without complications: Secondary | ICD-10-CM

## 2016-08-24 DIAGNOSIS — I10 Essential (primary) hypertension: Secondary | ICD-10-CM | POA: Diagnosis not present

## 2016-08-24 DIAGNOSIS — E785 Hyperlipidemia, unspecified: Secondary | ICD-10-CM

## 2016-08-24 DIAGNOSIS — I5032 Chronic diastolic (congestive) heart failure: Secondary | ICD-10-CM | POA: Diagnosis not present

## 2016-08-24 DIAGNOSIS — M1712 Unilateral primary osteoarthritis, left knee: Secondary | ICD-10-CM

## 2016-08-24 DIAGNOSIS — R6889 Other general symptoms and signs: Secondary | ICD-10-CM | POA: Diagnosis not present

## 2016-08-24 LAB — HEPATIC FUNCTION PANEL
ALBUMIN: 3.8 g/dL (ref 3.5–5.2)
ALK PHOS: 93 U/L (ref 39–117)
ALT: 8 U/L (ref 0–35)
AST: 11 U/L (ref 0–37)
Bilirubin, Direct: 0.1 mg/dL (ref 0.0–0.3)
Total Bilirubin: 0.3 mg/dL (ref 0.2–1.2)
Total Protein: 8 g/dL (ref 6.0–8.3)

## 2016-08-24 LAB — CBC WITH DIFFERENTIAL/PLATELET
BASOS ABS: 0.1 10*3/uL (ref 0.0–0.1)
Basophils Relative: 0.9 % (ref 0.0–3.0)
EOS PCT: 2.8 % (ref 0.0–5.0)
Eosinophils Absolute: 0.2 10*3/uL (ref 0.0–0.7)
HCT: 39.1 % (ref 36.0–46.0)
Hemoglobin: 12.2 g/dL (ref 12.0–15.0)
Lymphocytes Relative: 30.4 % (ref 12.0–46.0)
Lymphs Abs: 1.9 10*3/uL (ref 0.7–4.0)
MCHC: 31.2 g/dL (ref 30.0–36.0)
MCV: 82.7 fl (ref 78.0–100.0)
MONOS PCT: 9.1 % (ref 3.0–12.0)
Monocytes Absolute: 0.6 10*3/uL (ref 0.1–1.0)
NEUTROS PCT: 56.8 % (ref 43.0–77.0)
Neutro Abs: 3.6 10*3/uL (ref 1.4–7.7)
PLATELETS: 263 10*3/uL (ref 150.0–400.0)
RBC: 4.72 Mil/uL (ref 3.87–5.11)
RDW: 17 % — AB (ref 11.5–15.5)
WBC: 6.3 10*3/uL (ref 4.0–10.5)

## 2016-08-24 LAB — LIPID PANEL
CHOLESTEROL: 183 mg/dL (ref 0–200)
HDL: 51.7 mg/dL (ref >39.00)
LDL Cholesterol: 117 mg/dL — ABNORMAL HIGH (ref 0–99)
NonHDL: 130.86
Total CHOL/HDL Ratio: 4
Triglycerides: 69 mg/dL (ref 0.0–149.0)
VLDL: 13.8 mg/dL (ref 0.0–40.0)

## 2016-08-24 LAB — BASIC METABOLIC PANEL
BUN: 31 mg/dL — AB (ref 6–23)
CO2: 28 mEq/L (ref 19–32)
Calcium: 9.5 mg/dL (ref 8.4–10.5)
Chloride: 104 mEq/L (ref 96–112)
Creatinine, Ser: 0.88 mg/dL (ref 0.40–1.20)
GFR: 67.33 mL/min (ref >60.00)
GLUCOSE: 100 mg/dL — AB (ref 70–99)
Potassium: 4.3 mEq/L (ref 3.5–5.1)
SODIUM: 137 meq/L (ref 135–145)

## 2016-08-24 LAB — HEMOGLOBIN A1C: HEMOGLOBIN A1C: 6.4 % (ref 4.6–6.5)

## 2016-08-24 MED ORDER — FUROSEMIDE 20 MG PO TABS: 60.0000 mg | ORAL_TABLET | Freq: Two times a day (BID) | ORAL | 11 refills | Status: DC

## 2016-08-24 MED ORDER — ATORVASTATIN CALCIUM 20 MG PO TABS: 20.0000 mg | ORAL_TABLET | Freq: Every day | ORAL | 3 refills | Status: DC

## 2016-08-24 NOTE — Patient Instructions (Signed)
Good to see you.  Ice 20 minutes 2 times daily. Usually after activity and before bed. pennsaid pinkie amount topically 2 times daily as needed.  If the injections work we can repeat every 3 months.  Stay active.  We will try the compression stockings as well.   See me again 3 months or sooner if the pain come back and want to try the other injections.

## 2016-08-24 NOTE — Progress Notes (Signed)
Subjective:    Patient ID: Alyssa Ingram, female    DOB: 15-Aug-1945, 71 y.o.   MRN: [id number]  HPI  Here for wellness and f/u;  Overall doing ok;  Pt denies Chest pain, worsening SOB, DOE, wheezing, orthopnea, PND, worsening LE edema, palpitations, dizziness or syncope.  Pt denies neurological change such as new headache, facial or extremity weakness.  Pt denies polydipsia, polyuria, or low sugar symptoms. Pt states overall good compliance with treatment and medications, good tolerability, and has been trying to follow appropriate diet.  Pt denies worsening depressive symptoms, suicidal ideation or panic. No fever, night sweats, wt loss, loss of appetite, or other constitutional symptoms.  Pt states good ability with ADL's, has low fall risk, home safety reviewed and adequate, no other significant changes in hearing or vision, and only occasionally active with exercise.  Here to f/u recent wheezing with tx for copd exac and chf exacerbation;  Lost 9 lbs with increased diuretic Insurance no longer covers crestor, so needs changed.  Declines DM education, colonoscopy, dxa, tetanus. Wt Readings from Last 3 Encounters:  08/24/16 217 lb (98.4 kg)  08/24/16 217 lb (98.4 kg)  08/09/16 226 lb 4 oz (102.6 kg)   BP Readings from Last 3 Encounters:  08/24/16 126/76  08/24/16 138/82  08/09/16 124/70   Past Medical History:  Diagnosis Date  . Abnormal MRI, spine 11/2011   ?discitis L5-S1, s/p CT bx 12/12/11  . Allergic rhinitis, cause unspecified 01/31/2013  . Arthritis   . Asthma   . Cervical cancer ([place])   . Chronic diastolic heart failure ([place])   . Diabetic retinopathy ([place])   . GERD (gastroesophageal reflux disease)   . H/O cardiovascular stress test    Nuclear study in 2011 normal perfusion  . HOCM (hypertrophic obstructive cardiomyopathy) ([place])    Echo 12/19/11: Severe LVH, EF 55-65%, dynamic obstruction, wall motion normal, grade 1 diastolic dysfunction, systolic anterior motion of the mitral  valve with mild MS and mild MR, mild LAE, PASP 34  . Hyperlipidemia   . Hypertension   . Kidney stones   . Obesity   . Type II or unspecified type diabetes mellitus without mention of complication, uncontrolled 01/31/2013   Past Surgical History:  Procedure Laterality Date  . ABDOMINAL HYSTERECTOMY    . JOINT REPLACEMENT     right knee 2006  . rotater cuff     right, 2005    reports that she has never smoked. She has never used smokeless tobacco. She reports that she does not drink alcohol or use drugs. family history includes Alcohol abuse in her other; Arthritis in her other and other; Cancer in her other; Diabetes in her other and other; Heart disease in her father, other, and other; Hypertension in her other and other; Kidney disease in her other; Mental illness in her other; Sudden death in her other. Allergies  Allergen Reactions  . Sulfa Antibiotics Hives  . Metformin And Related   . Other Hives    Unknown drug  . Prednisone    Current Outpatient Prescriptions on File Prior to Visit  Medication Sig Dispense Refill  . albuterol (ACCUNEB) 0.63 MG/3ML nebulizer solution Take 3 mLs (0.63 mg total) by nebulization every 6 (six) hours as needed for wheezing. 75 mL 12  . albuterol (PROAIR HFA) 108 (90 Base) MCG/ACT inhaler Inhale 2 puffs into the lungs 2 (two) times daily. 8.5 Inhaler 2  . allopurinol (ZYLOPRIM) 100 MG tablet Take 1 tablet (100 mg total)  by mouth daily. 90 tablet 3  . aspirin 81 MG tablet Take 162 mg by mouth daily.     . Blood Glucose Monitoring Suppl (ONETOUCH VERIO IQ SYSTEM) w/Device KIT USE AS DIRECTED TO CHECK BLOOD SUGAR DAILY 1 kit 0  . cetirizine (ZYRTEC) 10 MG tablet TAKE 1 TABLET (10 MG TOTAL) BY MOUTH DAILY as needed 90 tablet 3  . cyclobenzaprine (FLEXERIL) 10 MG tablet Take 1 tablet (10 mg total) by mouth 3 (three) times daily as needed for muscle spasms. 90 tablet 6  . diltiazem (CARDIZEM CD) 180 MG 24 hr capsule Take 1 capsule (180 mg total) by mouth  daily. 90 capsule 3  . furosemide (LASIX) 40 MG tablet TAKE 2 TABLETS TWICE DAILY 120 tablet 11  . gabapentin (NEURONTIN) 300 MG capsule 1-2 tabs by mouth at bedtime 60 capsule 5  . glipiZIDE (GLIPIZIDE XL) 2.5 MG 24 hr tablet Take 1 tablet (2.5 mg total) by mouth daily with breakfast. 90 tablet 3  . glucose blood (ONETOUCH VERIO) test strip 1 each by Other route 3 (three) times daily. Use to check blood sugars three times a day Dx E11.9 270 each 3  . glucose blood test strip 1 each by Other route 3 (three) times daily. Use to check blood sugars twice a day E11.9 100 each 11  . ibuprofen (ADVIL,MOTRIN) 800 MG tablet Take 1 tablet (800 mg total) by mouth 3 (three) times daily. 21 tablet 0  . meclizine (ANTIVERT) 25 MG tablet TAKE 1 TABLET (25 MG TOTAL) BY MOUTH 3 (THREE) TIMES DAILY. 60 tablet 2  . metoprolol (LOPRESSOR) 50 MG tablet Take 1 tablet (50 mg total) by mouth 2 (two) times daily. 180 tablet 3  . ONETOUCH DELICA LANCETS 56Y MISC USE TO CHECK BLOOD SUGAR ONCE DAILY 100 each 1  . ONETOUCH VERIO test strip USE 2 TIMES DAILY TO CHECK BLOOD SUGARS 200 each 2  . pantoprazole (PROTONIX) 40 MG tablet Take 1 tablet (40 mg total) by mouth daily. 90 tablet 3  . rosuvastatin (CRESTOR) 20 MG tablet Take 1 tablet (20 mg total) by mouth daily. 90 tablet 3  . traMADol (ULTRAM) 50 MG tablet Take 1 tablet (50 mg total) by mouth every 8 (eight) hours as needed. 60 tablet 1  . triamcinolone (NASACORT AQ) 55 MCG/ACT AERO nasal inhaler Place 2 sprays into the nose daily. 1 Inhaler 12   No current facility-administered medications on file prior to visit.    Review of Systems Constitutional: Negative for other unusual diaphoresis, sweats, appetite or weight changes HENT: Negative for other worsening hearing loss, ear pain, facial swelling, mouth sores or neck stiffness.   Eyes: Negative for other worsening pain, redness or other visual disturbance.  Respiratory: Negative for other stridor or  swelling Cardiovascular: Negative for other palpitations or other chest pain  Gastrointestinal: Negative for worsening diarrhea or loose stools, blood in stool, distention or other pain Genitourinary: Negative for hematuria, flank pain or other change in urine volume.  Musculoskeletal: Negative for myalgias or other joint swelling.  Skin: Negative for other color change, or other wound or worsening drainage.  Neurological: Negative for other syncope or numbness. Hematological: Negative for other adenopathy or swelling Psychiatric/Behavioral: Negative for hallucinations, other worsening agitation, SI, self-injury, or new decreased concentration All other system neg per pt    Objective:   Physical Exam BP 126/76 (BP Location: Left Arm, Patient Position: Sitting, Cuff Size: Large)   Pulse (!) 57   Temp 97.6 F (36.4  C) (Oral)   Ht 5' (1.524 m)   Wt 217 lb (98.4 kg)   SpO2 98%   BMI 42.38 kg/m  VS noted,  Constitutional: Pt is oriented to person, place, and time. Appears well-developed and well-nourished, in no significant distress and comfortable Head: Normocephalic and atraumatic  Eyes: Conjunctivae and EOM are normal. Pupils are equal, round, and reactive to light Right Ear: External ear normal without discharge Left Ear: External ear normal without discharge Nose: Nose without discharge or deformity Mouth/Throat: Oropharynx is without other ulcerations and moist  Neck: Normal range of motion. Neck supple. No JVD present. No tracheal deviation present or significant neck LA or mass Cardiovascular: Normal rate, regular rhythm, normal heart sounds and intact distal pulses.   Pulmonary/Chest: WOB normal and breath sounds without rales or wheezing  Abdominal: Soft. Bowel sounds are normal. NT. No HSM  Musculoskeletal: Normal range of motion. Exhibits chronic 1-2+ bilat LE edema, left > right Lymphadenopathy: Has no other cervical adenopathy.  Neurological: Pt is alert and oriented to  person, place, and time. Pt has normal reflexes. No cranial nerve deficit. Motor grossly intact, Gait intact Skin: Skin is warm and dry. No rash noted or new ulcerations Psychiatric:  Has normal mood and affect. Behavior is normal without agitation No other exam findings Did not have labs done last visit as requested Lab Results  Component Value Date   WBC 5.8 12/09/2015   HGB 12.0 12/09/2015   HCT 37.7 12/09/2015   PLT 222 12/09/2015   GLUCOSE 95 12/09/2015   CHOL 199 07/27/2015   TRIG 85.0 07/27/2015   HDL 56.10 07/27/2015   LDLCALC 126 (H) 07/27/2015   ALT 9 07/27/2015   AST 11 07/27/2015   NA 138 12/09/2015   K 4.1 12/09/2015   CL 105 12/09/2015   CREATININE 0.85 12/09/2015   BUN 15 12/09/2015   CO2 27 12/09/2015   TSH 0.81 07/27/2015   INR 1.03 12/12/2011   HGBA1C 6.7 (H) 07/27/2015   MICROALBUR <0.7 07/07/2014       Assessment & Plan:

## 2016-08-24 NOTE — Assessment & Plan Note (Signed)
stable overall by history and exam, recent data reviewed with pt, and pt to continue medical treatment as before,  to f/u any worsening symptoms or concerns Lab Results  Component Value Date   HGBA1C 6.7 (H) 07/27/2015

## 2016-08-24 NOTE — Progress Notes (Signed)
Pre visit review using our clinic review tool, if applicable. No additional management support is needed unless otherwise documented below in the visit note. 

## 2016-08-24 NOTE — Assessment & Plan Note (Signed)
stable overall by history and exam, recent data reviewed with pt, and pt to continue medical treatment as before,  to f/u any worsening symptoms or concerns  

## 2016-08-24 NOTE — Assessment & Plan Note (Signed)
Patient likely has more of a subacromial bursitis. Patient and due to the other comorbidities likely intra-articular injection would be the most beneficial. Patient had this done today. I do not feel that further workup is necessary cousin would not change medical management at this time. Patient will monitor for blood sugar. Given some mild range of motion exercises. Follow-up again in 4-6 weeks

## 2016-08-24 NOTE — Assessment & Plan Note (Signed)

## 2016-08-24 NOTE — Patient Instructions (Signed)
OK to change the Crestor to Lipitor which is likely to be covered better by your insurance  OK to decrease the Lasix (furosemide) to a total of 60 mg twice per day (by taking 20 mg tablets at 3 pills twice per day)  Please check your daily weights, and call if your weight increases by 5 lbs or more in the next week  Please continue all other medications as before, and refills have been done if requested.  Please have the pharmacy call with any other refills you may need.  Please continue your efforts at being more active, low cholesterol diet, and weight control.  You are otherwise up to date with prevention measures today.  Please keep your appointments with your specialists as you may have planned  Please go to the LAB in the Basement (turn left off the elevator) for the tests to be done today  You will be contacted by phone if any changes need to be made immediately.  Otherwise, you will receive a letter about your results with an explanation, but please check with MyChart first.  Please remember to sign up for MyChart if you have not done so, as this will be important to you in the future with finding out test results, communicating by private email, and scheduling acute appointments online when needed.  Please return in 6 months, or sooner if needed, with Lab testing done 3-5 days before

## 2016-08-24 NOTE — Assessment & Plan Note (Signed)
Patient given injection today. Tolerated the procedure well. We discussed icing regimen and home exercises. Discussed watching blood sugars closely for the next 3 days. Discussed icing regimen. Topical anti-inflammatory's given. Follow-up again in 4 weeks. Worsening symptoms could be a candidate for viscous supplementation. Due to the instability and patient's thigh to calf ratio we will have patient be referred for custom brace.

## 2016-08-24 NOTE — Assessment & Plan Note (Signed)
Now appears euvolemic, for f/u labs today, ok to decrease the lasix to 60 mg bid (she prefers the 20 mg tabs), daily wts and call for increase > 5 lbs on reduced lasix

## 2016-08-24 NOTE — Assessment & Plan Note (Signed)
Ok to change the crestor to lipitor due to insurance coverage

## 2016-08-24 NOTE — Progress Notes (Signed)
Pre-visit discussion using our clinic review tool. No additional management support is needed unless otherwise documented below in the visit note.  

## 2016-08-24 NOTE — Progress Notes (Signed)
[name] [address] [place], [place] [address] Phone: [phone] Subjective:    I'm seeing this patient by the request  of:  Cathlean Cower, MD   CC: bilateral knee pain f/u   [id number]  Alyssa Ingram is a 71 y.o. female coming in with complaint of right knee pain.patient does have past medical history significant for a knee replacement in 2006. There is concern for patient having instability. Patient was referred but has not going.  Patient is also complaining of left knee pain. 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 Last injection was 9 months ago. States that the injection seemed to help for approximately 6 months and then the pain started coming back. Patient is having increasing instability of the knee. Unable to walk greater than 100 feet without having to stop. Some of it is secondary to the shortness of breath she states that her congestive heart failure but some of it is because the weakness in the knee.  Patient is also complaining of right shoulder pain. Seems to be a dull, throbbing, some mild weakness noted. No radiation of pain. Hurts with movement and seems better at rest. If she rolls on that side at night it can occur up. Seems to stay localized mostly to the posterior aspect of the shoulder. Has not tried any home medicine at this time.  Past Medical History:  Diagnosis Date  . Abnormal MRI, spine 11/2011   ?discitis L5-S1, s/p CT bx 12/12/11  . Allergic rhinitis, cause unspecified 01/31/2013  . Arthritis   . Asthma   . Cervical cancer ([place])   . Chronic diastolic heart failure ([place])   . Diabetic retinopathy ([place])   . GERD (gastroesophageal reflux disease)   . H/O cardiovascular stress test    Nuclear study in 2011 normal perfusion  . HOCM (hypertrophic obstructive cardiomyopathy) ([place])    Echo 12/19/11: Severe LVH, EF 55-65%, dynamic obstruction, wall motion normal, grade 1 diastolic  dysfunction, systolic anterior motion of the mitral valve with mild MS and mild MR, mild LAE, PASP 34  . Hyperlipidemia   . Hypertension   . Kidney stones   . Obesity   . Type II or unspecified type diabetes mellitus without mention of complication, uncontrolled 01/31/2013   Past Surgical History:  Procedure Laterality Date  . ABDOMINAL HYSTERECTOMY    . JOINT REPLACEMENT     right knee 2006  . rotater cuff     right, 2005   Social History  Substance Use Topics  . Smoking status: Never Smoker  . Smokeless tobacco: Never Used  . Alcohol use No   Allergies  Allergen Reactions  . Sulfa Antibiotics Hives  . Metformin And Related   . Other Hives    Unknown drug   Family History  Problem Relation Age of Onset  . Heart disease Father   . Arthritis Other   . Heart disease Other   . Hypertension Other   . Diabetes Other   . Alcohol abuse Other   . Arthritis Other   . Cancer Other     Lung Cancer  . Heart disease Other   . Hypertension Other   . Sudden death Other   . Kidney disease Other   . Mental illness Other   . Diabetes Other   . Colon cancer Neg Hx    . Review of Systems: No headache, visual changes, nausea, vomiting, diarrhea, constipation, dizziness, abdominal pain,  skin rash, fevers, chills, night sweats, weight loss, swollen lymph nodes, mood changes.   Positive muscle aches, body aches, lower extremity swelling, shortness of breath,  Objective  There were no vitals taken for this visit.  General: No apparent distress alert and oriented x3 mood and affect normal, dressed appropriately. obese HEENT: Pupils equal, extraocular movements intact  Respiratory: Patient's speak in full sentences and does not appear short of breath  Cardiovascular: 2+ lower extremity edema continues to worsen Symmetric, non tender, no erythema  Skin: Warm dry intact with no signs of infection or rash on extremities or on axial skeleton.  Abdomen: Soft nontender  Neuro: Cranial nerves  II through XII are intact, neurovascularly intact in all extremities with 2+ DTRs and 2+ pulses.  Lymph: 2+ pitting edema of the lower extremity bilaterally Gait patient is in a wheelchair today. MSK:  Mild tender with arthritic changes of multiple joints. Right knee exam shows the patient's incision is well-healed with no signs of infection. Patient has almost full extension lacking the last 5 and flexion of 95. Patient does have somewhat of a positive clunk.  No change from previous exam.  Knee: Left valgus deformity noted. Large thigh to calf ratio.  Tender to palpation over medial and PF joint line.  Patient only has 20 of flexion instability with valgus force.  painful patellar compression. Patellar glide with severe crepitus. Patellar and quadriceps tendons unremarkable. Hamstring and quadriceps strength is normal.  Shoulder: Right Inspection reveals no abnormalities, atrophy or asymmetry. Diffuse tenderness to palpation Patient has 4 out of 5 strength. Rotator cuff strength normal throughout. Mild impingement noted Speeds and Yergason's tests normal. No labral pathology noted with negative Obrien's, negative clunk and good stability. Normal scapular function observed. Mild painful arc but negative drop arm No apprehension sign     After informed written and verbal consent, patient was seated on exam table. Left knee was prepped with alcohol swab and utilizing anterolateral approach, patient's left knee space was injected with 4:1  marcaine 0.5%: Kenalog 40mg /dL. Patient tolerated the procedure well without immediate complications.  After informed written and verbal consent, patient was seated on exam table. Right shoulder was prepped with alcohol swab and utilizing posterior approach, patient's right glenohumeral space was injected with 4:1  marcaine 0.5%: Kenalog 40mg /dL. Patient tolerated the procedure well without immediate complications    Impression and  Recommendations:     This case required medical decision making of moderate complexity.

## 2016-08-25 LAB — TSH: TSH: 0.5 u[IU]/mL (ref 0.35–4.50)

## 2016-08-26 ENCOUNTER — Encounter: Payer: Self-pay | Admitting: Internal Medicine

## 2016-08-26 ENCOUNTER — Other Ambulatory Visit: Payer: Self-pay | Admitting: Internal Medicine

## 2016-08-26 MED ORDER — ATORVASTATIN CALCIUM 40 MG PO TABS
40.0000 mg | ORAL_TABLET | Freq: Every day | ORAL | 3 refills | Status: DC
Start: 2016-08-26 — End: 2018-07-19

## 2016-08-28 ENCOUNTER — Other Ambulatory Visit: Payer: Self-pay

## 2016-08-28 NOTE — Patient Outreach (Signed)
[place] [facility]) Care Management  08/28/2016  Alyssa Ingram October 15, 1945 [id number]  Telephone screening REFERRAL DATE; 08/21/16       REFERRAL SOURCE; Primary MD REFERRAL REASON; Consult reason, asthma, congestive heart failure  Call to patient regarding telephone screening. HIPAA verified with patient. Patient states she is unable to talk at this time. Patient took [facility] contact phone number and stated she would call back. Patient states she is celebrating her birthday this week.  PLAN; RNCM will await return call from patient. If no return call RNCM will attempt 3rd telephone outreach within 1 week.   [name] RN,BSN,CCM [facility] Telephonic  [phone]

## 2016-08-29 ENCOUNTER — Telehealth: Payer: Self-pay

## 2016-08-29 NOTE — Progress Notes (Signed)
Attempted to reach patient and unable to leave a message on any of the numbers that we have on file.

## 2016-08-29 NOTE — Telephone Encounter (Signed)
Pt has been informed and expressed understanding.  

## 2016-08-29 NOTE — Telephone Encounter (Signed)
-----   Message from [name], MD sent at 08/26/2016  4:03 PM EDT ----- Letter sent, cont same tx except  The test results show that your current treatment is OK, except the LDL cholesterol is still elevated more than 70.  Please increase the generic Lipitor to 40 mg per day..    There is no other need for change of treatment or further evaluation based on these results, at this time.    Shirron to please inform pt, I will do rx

## 2016-08-29 NOTE — Telephone Encounter (Signed)
Called pt, she was still sleeping. Left a msg with her son for her to give me a call back so we can discuss lab results.

## 2016-09-04 ENCOUNTER — Ambulatory Visit: Payer: Self-pay

## 2016-09-04 ENCOUNTER — Other Ambulatory Visit: Payer: Self-pay | Admitting: Internal Medicine

## 2016-09-05 ENCOUNTER — Other Ambulatory Visit: Payer: Self-pay

## 2016-09-05 NOTE — Patient Outreach (Signed)
[place] [facility]) Care Management  09/05/2016  Alyssa Ingram [birth year] [id number]  TELEPHONE SCREENING Referral date: 08/10/16 Referral source: Primary MD Referral reason: asthma/ heart failure Insurance: [facility]  Third telephone call to patient regarding primary MD referral. Unable to reach patient. HIPAA compliant voice message left with call back phone number.    PLAN: RNCM will send patient outreach letter to attempt contact.   [name] RN,BSN,CCM [facility] Telephonic  [phone]

## 2016-09-06 ENCOUNTER — Ambulatory Visit: Payer: Medicare HMO | Admitting: Endocrinology

## 2016-09-11 NOTE — Progress Notes (Signed)
HPI: FU HCM. Patient previously lived in [place]. Records from there include: Nuclear study in November of 2011 showed an ejection fraction of 62% and normal perfusion. Exercise treadmill February 2014 showed poor exercise capacity and study was technically difficult. However no hypotension noted. Carotid Dopplers January 2014 showed no significant carotid disease. 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 Echo 2/17 vigorous LV function; severe asymmetric LVH, grade 1 DD; severe LAE; findings cw HOCM. Since last seen, she denies dyspnea, chest pain or syncope. She has chronic pedal edema.  Current Outpatient Prescriptions  Medication Sig Dispense Refill  . albuterol (ACCUNEB) 0.63 MG/3ML nebulizer solution Take 3 mLs (0.63 mg total) by nebulization every 6 (six) hours as needed for wheezing. 75 mL 12  . albuterol (PROAIR HFA) 108 (90 Base) MCG/ACT inhaler Inhale 2 puffs into the lungs 2 (two) times daily. 8.5 Inhaler 2  . allopurinol (ZYLOPRIM) 100 MG tablet Take 1 tablet (100 mg total) by mouth daily. 90 tablet 3  . aspirin 81 MG tablet Take 162 mg by mouth daily.     Marland Kitchen atorvastatin (LIPITOR) 40 MG tablet Take 1 tablet (40 mg total) by mouth daily. 90 tablet 3  . Blood Glucose Monitoring Suppl (ONETOUCH VERIO IQ SYSTEM) w/Device KIT USE AS DIRECTED TO CHECK BLOOD SUGAR DAILY 1 kit 0  . cetirizine (ZYRTEC) 10 MG tablet TAKE 1 TABLET (10 MG TOTAL) BY MOUTH DAILY as needed 90 tablet 3  . cyclobenzaprine (FLEXERIL) 10 MG tablet Take 1 tablet (10 mg total) by mouth 3 (three) times daily as needed for muscle spasms. 90 tablet 6  . diltiazem (CARDIZEM CD) 180 MG 24 hr capsule Take 1 capsule (180 mg total) by mouth daily. 90 capsule 3  . furosemide (LASIX) 20 MG tablet Take 3 tablets (60 mg total) by mouth 2 (two) times daily. 180 tablet 11  . gabapentin (NEURONTIN) 300 MG capsule 1-2 tabs by mouth at bedtime 60 capsule 5  . glipiZIDE (GLIPIZIDE XL) 2.5 MG 24 hr tablet Take  1 tablet (2.5 mg total) by mouth daily with breakfast. 90 tablet 3  . glucose blood (ONETOUCH VERIO) test strip 1 each by Other route 3 (three) times daily. Use to check blood sugars three times a day Dx E11.9 270 each 3  . glucose blood test strip 1 each by Other route 3 (three) times daily. Use to check blood sugars twice a day E11.9 100 each 11  . ibuprofen (ADVIL,MOTRIN) 800 MG tablet Take 1 tablet (800 mg total) by mouth 3 (three) times daily. 21 tablet 0  . meclizine (ANTIVERT) 25 MG tablet TAKE 1 TABLET (25 MG TOTAL) BY MOUTH 3 (THREE) TIMES DAILY. 60 tablet 2  . metoprolol (LOPRESSOR) 50 MG tablet Take 1 tablet (50 mg total) by mouth 2 (two) times daily. 180 tablet 3  . ONETOUCH DELICA LANCETS 46E MISC USE TO CHECK BLOOD SUGAR ONCE DAILY 100 each 1  . ONETOUCH VERIO test strip USE 2 TIMES DAILY TO CHECK BLOOD SUGARS 200 each 2  . pantoprazole (PROTONIX) 40 MG tablet Take 1 tablet (40 mg total) by mouth daily. 90 tablet 3  . traMADol (ULTRAM) 50 MG tablet Take 1 tablet (50 mg total) by mouth every 8 (eight) hours as needed. 60 tablet 1  . triamcinolone (NASACORT AQ) 55 MCG/ACT AERO nasal inhaler Place 2 sprays into the nose daily. 1 Inhaler 12  . VENTOLIN HFA 108 (90 Base) MCG/ACT inhaler INHALE 2 PUFFS INTO  THE LUNGS 2 (TWO) TIMES DAILY. 18 Inhaler 2   No current facility-administered medications for this visit.      Past Medical History:  Diagnosis Date  . Abnormal MRI, spine 11/2011   ?discitis L5-S1, s/p CT bx 12/12/11  . Allergic rhinitis, cause unspecified 01/31/2013  . Arthritis   . Asthma   . Cervical cancer ([place])   . Chronic diastolic heart failure ([place])   . Diabetic retinopathy ([place])   . GERD (gastroesophageal reflux disease)   . H/O cardiovascular stress test    Nuclear study in 2011 normal perfusion  . HOCM (hypertrophic obstructive cardiomyopathy) ([place])    Echo 12/19/11: Severe LVH, EF 55-65%, dynamic obstruction, wall motion normal, grade 1 diastolic dysfunction,  systolic anterior motion of the mitral valve with mild MS and mild MR, mild LAE, PASP 34  . Hyperlipidemia   . Hypertension   . Kidney stones   . Obesity   . Type II or unspecified type diabetes mellitus without mention of complication, uncontrolled 01/31/2013    Past Surgical History:  Procedure Laterality Date  . ABDOMINAL HYSTERECTOMY    . JOINT REPLACEMENT     right knee 2006  . rotater cuff     right, 2005    Social History   Social History  . Marital status: Widowed    Spouse name: N/A  . Number of children: 5  . Years of education: 14   Occupational History  . Retired Optometrist    Social History Main Topics  . Smoking status: Never Smoker  . Smokeless tobacco: Never Used  . Alcohol use No  . Drug use: No  . Sexual activity: Not on file   Other Topics Concern  . Not on file   Social History Narrative  . No narrative on file    Family History  Problem Relation Age of Onset  . Heart disease Father   . Arthritis Other   . Heart disease Other   . Hypertension Other   . Diabetes Other   . Alcohol abuse Other   . Arthritis Other   . Cancer Other     Lung Cancer  . Heart disease Other   . Hypertension Other   . Sudden death Other   . Kidney disease Other   . Mental illness Other   . Diabetes Other   . Colon cancer Neg Hx     ROS: no fevers or chills, productive cough, hemoptysis, dysphasia, odynophagia, melena, hematochezia, dysuria, hematuria, rash, seizure activity, orthopnea, PND, pedal edema, claudication. Remaining systems are negative.  Physical Exam: Well-developed obese in no acute distress.  Skin is warm and dry.  HEENT is normal.  Neck is supple.  Chest is clear to auscultation with normal expansion.  Cardiovascular exam is regular rate and rhythm. 2/6 systolic murmur Abdominal exam nontender or distended. No masses palpated. Extremities show 3+ lymphedema. neuro grossly intact  ECG- Sinus rhythm at a rate of 60, left ventricular  hypertrophy, no ST changes. personally reviewed  A/P  1 hypertrophic obstructive cardiomyopathy-plan repeat echocardiogram. We cannot perform an exercise treadmill to evaluate systolic blood pressure response as she cannot walk on a treadmill. There is no family history of sudden death and she has no history of syncope. I have instructed her to have her children screened as well. Given hypertrophic cardiomyopathy I will discontinue Cardizem and increase metoprolol to 75 mg twice a day. Titrate as tolerated.   2 hypertension-blood pressure mildly elevated but she states typically controlled. I  am discontinuing Cardizem and increasing metoprolol for hypertrophic cardiomyopathy. Follow blood pressure at home and adjust regimen as needed.  3 chronic diastolic congestive heart failure-patient is euvolemic on examination (she has chronic lymphedema). Continue present dose of diuretic.   Kirk Ruths, MD

## 2016-09-14 ENCOUNTER — Encounter: Payer: Self-pay | Admitting: Cardiology

## 2016-09-14 ENCOUNTER — Ambulatory Visit (INDEPENDENT_AMBULATORY_CARE_PROVIDER_SITE_OTHER): Payer: Medicare HMO | Admitting: Cardiology

## 2016-09-14 VITALS — BP 136/90 | HR 60 | Ht 60.0 in | Wt 214.0 lb

## 2016-09-14 DIAGNOSIS — I421 Obstructive hypertrophic cardiomyopathy: Secondary | ICD-10-CM

## 2016-09-14 DIAGNOSIS — I5032 Chronic diastolic (congestive) heart failure: Secondary | ICD-10-CM

## 2016-09-14 DIAGNOSIS — I422 Other hypertrophic cardiomyopathy: Secondary | ICD-10-CM

## 2016-09-14 DIAGNOSIS — I10 Essential (primary) hypertension: Secondary | ICD-10-CM | POA: Diagnosis not present

## 2016-09-14 DIAGNOSIS — R6889 Other general symptoms and signs: Secondary | ICD-10-CM | POA: Diagnosis not present

## 2016-09-14 MED ORDER — METOPROLOL TARTRATE 50 MG PO TABS: 75.0000 mg | ORAL_TABLET | Freq: Two times a day (BID) | ORAL | 3 refills | Status: DC

## 2016-09-14 NOTE — Patient Instructions (Addendum)
Medication Instructions:   INCREASE METOPROLOL TO 75 MG TWICE DAILY= 1 AND 1/2 OF THE 50 MG TABLETS TWICE DAILY  STOP DILTIAZEM  Testing/Procedures:  Your physician has requested that you have an echocardiogram. Echocardiography is a painless test that uses sound waves to create images of your heart. It provides your doctor with information about the size and shape of your heart and how well your heart's chambers and valves are working. This procedure takes approximately one hour. There are no restrictions for this procedure.    Follow-Up:  Your physician wants you to follow-up in: [place] will receive a reminder letter in the mail two months in advance. If you don't receive a letter, please call our office to schedule the follow-up appointment.   If you need a refill on your cardiac medications before your next appointment, please call your pharmacy.

## 2016-09-19 ENCOUNTER — Other Ambulatory Visit: Payer: Self-pay

## 2016-09-19 NOTE — Patient Outreach (Signed)
[facility]) Care Management  09/19/2016  Alyssa Ingram 13-Jul-1945 [id number]  Case closure: No response after 3 telephone calls and outreach letter attempt. PLAN:  RNCM will refer patient to care management assistant to close due to not being able to establish contact. RNCM will notify patients primary MD of closure.   [name] RN,BSN,CCM [facility] Telephonic  [phone]

## 2016-09-27 ENCOUNTER — Other Ambulatory Visit (HOSPITAL_COMMUNITY): Payer: Medicare HMO

## 2016-10-06 ENCOUNTER — Other Ambulatory Visit: Payer: Self-pay

## 2016-10-06 ENCOUNTER — Ambulatory Visit (HOSPITAL_COMMUNITY): Payer: Medicare HMO | Attending: Cardiology

## 2016-10-06 DIAGNOSIS — I422 Other hypertrophic cardiomyopathy: Secondary | ICD-10-CM

## 2016-10-06 DIAGNOSIS — R6889 Other general symptoms and signs: Secondary | ICD-10-CM | POA: Diagnosis not present

## 2016-10-10 ENCOUNTER — Telehealth: Payer: Self-pay | Admitting: Endocrinology

## 2016-10-10 NOTE — Telephone Encounter (Signed)
Called the [place] number and verified patients appointment on 10/11/2016 at 4pm. They are going to try their best to have her here on time.   Also called patient on home phone and not able to leave a voice message. Called her cell phone and a man answered and advised to me that I had the wrong number.

## 2016-10-10 NOTE — Telephone Encounter (Signed)
Patient called to advise that [employer] is not able to provide her transportation to her appt tomorrow. She states that they need the office to call them to emphasize medical necessity so that she can get transportation.   Ph # is [phone]

## 2016-10-11 ENCOUNTER — Other Ambulatory Visit: Payer: Self-pay

## 2016-10-11 ENCOUNTER — Ambulatory Visit (INDEPENDENT_AMBULATORY_CARE_PROVIDER_SITE_OTHER): Payer: Medicare HMO | Admitting: Endocrinology

## 2016-10-11 ENCOUNTER — Encounter: Payer: Self-pay | Admitting: Endocrinology

## 2016-10-11 ENCOUNTER — Ambulatory Visit: Payer: Medicare HMO | Admitting: Endocrinology

## 2016-10-11 VITALS — BP 138/84 | HR 64 | Ht 60.0 in | Wt 213.0 lb

## 2016-10-11 DIAGNOSIS — E785 Hyperlipidemia, unspecified: Secondary | ICD-10-CM

## 2016-10-11 DIAGNOSIS — R6889 Other general symptoms and signs: Secondary | ICD-10-CM | POA: Diagnosis not present

## 2016-10-11 DIAGNOSIS — E1142 Type 2 diabetes mellitus with diabetic polyneuropathy: Secondary | ICD-10-CM | POA: Diagnosis not present

## 2016-10-11 LAB — GLUCOSE, POCT (MANUAL RESULT ENTRY): POC Glucose: 86 mg/dl (ref 70–99)

## 2016-10-11 MED ORDER — ACCU-CHEK AVIVA PLUS W/DEVICE KIT: PACK | 2 refills | Status: DC

## 2016-10-11 MED ORDER — GLUCOSE BLOOD VI STRP: ORAL_STRIP | 3 refills | Status: DC

## 2016-10-11 NOTE — Patient Instructions (Addendum)
Check blood sugars on waking up  weekly   Also check blood sugars about 2 hours after a meal and do this after different meals by rotation  Recommended blood sugar levels on waking up is 90-130 and about 2 hours after meal is 130-160  Please bring your blood sugar monitor to each visit, thank you  [facility]  Take Gabapentin 2x daily as needed

## 2016-10-11 NOTE — Progress Notes (Signed)
Patient ID: Alyssa Ingram, female   DOB: 12/10/1945, 71 y.o.   MRN: [id number]            Reason for Appointment: Consultation for Type 2 Diabetes   History of Present Illness:          Date of diagnosis of type 2 diabetes mellitus: 2012       Background history:  She was diagnosed to have diabetes before she came to [place] with the blood sugar about 170-180 Not clear when she was started on metformin but she was taking 500 mg twice a day for about 4 years at least without side effects Prior A1c has been generally ranging 6-6.7 She has not had any significant diabetes education in the past  Recent history:     Non-insulin hypoglycemic drugs the patient is taking are: Glipizide ER 2.5 mg daily  Her most recent A1c was 6.4 in April, previously 6.7 a year ago  Current management, blood sugar patterns and problems identified:  She was apparently complaining of abdominal cramping and nonspecific GI symptoms in 1/18 and she was changed from metformin to glipizide ER 2.5 mg daily, this relieved her GI symptoms  However she has not monitored her blood sugar for at least 3 months as she does not have a functioning meter, previously using One Touch  She was wanting to come to specialist for general opinions and other related and unrelated problems  She also wants meal planning to learn about diet and is interested in reducing her medications  Lab glucose done in April was 100 in the afternoon  She does not report any hypoglycemic symptoms           Side effects from medications have been: Metformin causes cramps, occasional diarrhea.  Compliance with the medical regimen: Fair Hypoglycemia:   none  Glucose monitoring: not done since 2/18 Glucometer:   none     Self-care: The diet that the patient has been following is: tries to limit high-fat foods and drinks with sugar .     Typical meal intake: Breakfast is usually cereal like Cheerios, lunch is usually a sandwich                Dietician visit, most recent:?  At diagnosis               Exercise:  unable to do any   Weight history:  Wt Readings from Last 3 Encounters:  10/11/16 213 lb (96.6 kg)  09/14/16 214 lb (97.1 kg)  08/24/16 217 lb (98.4 kg)    Glycemic control:   Lab Results  Component Value Date   HGBA1C 6.4 08/24/2016   HGBA1C 6.7 (H) 07/27/2015   HGBA1C 6.0 01/26/2015   Lab Results  Component Value Date   MICROALBUR <0.7 07/07/2014   LDLCALC 117 (H) 08/24/2016   CREATININE 0.88 08/24/2016   Lab Results  Component Value Date   MICRALBCREAT 0.7 07/07/2014    No results found for: FRUCTOSAMINE    Allergies as of 10/11/2016      Reactions   Sulfa Antibiotics Hives   Metformin And Related    Other Hives   Unknown drug   Prednisone       Medication List       Accurate as of 10/11/16  8:27 PM. Always use your most recent med list.          ACCU-CHEK AVIVA PLUS w/Device Kit Use to check blood sugar three times daily  albuterol 108 (90 Base) MCG/ACT inhaler Commonly known as:  PROAIR HFA Inhale 2 puffs into the lungs 2 (two) times daily.   albuterol 0.63 MG/3ML nebulizer solution Commonly known as:  ACCUNEB Take 3 mLs (0.63 mg total) by nebulization every 6 (six) hours as needed for wheezing.   VENTOLIN HFA 108 (90 Base) MCG/ACT inhaler Generic drug:  albuterol INHALE 2 PUFFS INTO THE LUNGS 2 (TWO) TIMES DAILY.   allopurinol 100 MG tablet Commonly known as:  ZYLOPRIM Take 1 tablet (100 mg total) by mouth daily.   aspirin 81 MG tablet Take 162 mg by mouth daily.   atorvastatin 40 MG tablet Commonly known as:  LIPITOR Take 1 tablet (40 mg total) by mouth daily.   cetirizine 10 MG tablet Commonly known as:  ZYRTEC TAKE 1 TABLET (10 MG TOTAL) BY MOUTH DAILY as needed   cyclobenzaprine 10 MG tablet Commonly known as:  FLEXERIL Take 1 tablet (10 mg total) by mouth 3 (three) times daily as needed for muscle spasms.   furosemide 20 MG tablet Commonly  known as:  LASIX Take 3 tablets (60 mg total) by mouth 2 (two) times daily.   gabapentin 300 MG capsule Commonly known as:  NEURONTIN 1-2 tabs by mouth at bedtime   glipiZIDE 2.5 MG 24 hr tablet Commonly known as:  GLIPIZIDE XL Take 1 tablet (2.5 mg total) by mouth daily with breakfast.   glucose blood test strip Commonly known as:  ACCU-CHEK AVIVA PLUS Use to check blood sugar three times daily   ibuprofen 800 MG tablet Commonly known as:  ADVIL,MOTRIN Take 1 tablet (800 mg total) by mouth 3 (three) times daily.   meclizine 25 MG tablet Commonly known as:  ANTIVERT TAKE 1 TABLET (25 MG TOTAL) BY MOUTH 3 (THREE) TIMES DAILY.   metoprolol tartrate 50 MG tablet Commonly known as:  LOPRESSOR Take 1.5 tablets (75 mg total) by mouth 2 (two) times daily.   ONETOUCH DELICA LANCETS 33G Misc USE TO CHECK BLOOD SUGAR ONCE DAILY   pantoprazole 40 MG tablet Commonly known as:  PROTONIX Take 1 tablet (40 mg total) by mouth daily.   traMADol 50 MG tablet Commonly known as:  ULTRAM Take 1 tablet (50 mg total) by mouth every 8 (eight) hours as needed.   triamcinolone 55 MCG/ACT Aero nasal inhaler Commonly known as:  NASACORT AQ Place 2 sprays into the nose daily.       Allergies:  Allergies  Allergen Reactions  . Sulfa Antibiotics Hives  . Metformin And Related   . Other Hives    Unknown drug  . Prednisone     Past Medical History:  Diagnosis Date  . Abnormal MRI, spine 11/2011   ?discitis L5-S1, s/p CT bx 12/12/11  . Allergic rhinitis, cause unspecified 01/31/2013  . Arthritis   . Asthma   . Cervical cancer ([facility])   . Chronic diastolic heart failure ([facility])   . Diabetic retinopathy ([facility])   . GERD (gastroesophageal reflux disease)   . H/O cardiovascular stress test    Nuclear study in 2011 normal perfusion  . HOCM (hypertrophic obstructive cardiomyopathy) ([facility])    Echo 12/19/11: Severe LVH, EF 55-65%, dynamic obstruction, wall motion normal, grade 1 diastolic dysfunction,  systolic anterior motion of the mitral valve with mild MS and mild MR, mild LAE, PASP 34  . Hyperlipidemia   . Hypertension   . Kidney stones   . Obesity   . Type II or unspecified type diabetes mellitus without mention of  complication, uncontrolled 01/31/2013    Past Surgical History:  Procedure Laterality Date  . ABDOMINAL HYSTERECTOMY    . JOINT REPLACEMENT     right knee 2006  . rotater cuff     right, 2005    Family History  Problem Relation Age of Onset  . Heart disease Father   . Arthritis Other   . Heart disease Other   . Hypertension Other   . Diabetes Other   . Alcohol abuse Other   . Arthritis Other   . Cancer Other        Lung Cancer  . Heart disease Other   . Hypertension Other   . Sudden death Other   . Kidney disease Other   . Mental illness Other   . Diabetes Other   . Colon cancer Neg Hx     Social History:  reports that she has never smoked. She has never used smokeless tobacco. She reports that she does not drink alcohol or use drugs.   Review of Systems  Constitutional: Negative for weight gain and reduced appetite.  HENT: Negative for trouble swallowing.   Eyes:       She had bleeding in the left eye in 2013 minimal residual vision  Respiratory: Negative for shortness of breath.   Cardiovascular: Positive for leg swelling. Negative for chest pain.       Has had persistent leg edema for 20+ yrs, recommended elastic stockings by cardiologist  Gastrointestinal: Negative for diarrhea and abdominal pain.  Endocrine: Negative for fatigue and polydipsia.  Genitourinary: Negative for frequency.  Musculoskeletal: Positive for joint pain.       She has a significant knee joint pains and has been recommended a brace  Skin: Positive for rash.  Neurological: Positive for tingling and balance difficulty. Negative for weakness.       Pains and some tingling in her lower legs and feet, better at night with gabapentin but may have symptoms during the day  also Symptoms started a year ago  Psychiatric/Behavioral: Negative for insomnia.     Lipid history: Last LDL still high despite taking Lipitor 40 mg daily, followed by PCP    Lab Results  Component Value Date   CHOL 183 08/24/2016   HDL 51.70 08/24/2016   LDLCALC 117 (H) 08/24/2016   TRIG 69.0 08/24/2016   CHOLHDL 4 08/24/2016           Hypertension:She is on metoprolol only  Most recent eye exam was 2017  Most recent foot exam: 5/18    LABS:  Office Visit on 10/11/2016  Component Date Value Ref Range Status  . POC Glucose 10/11/2016 86  70 - 99 mg/dl Final    Physical Examination:  BP 138/84   Pulse 64   Ht 5' (1.524 m)   Wt 213 lb (96.6 kg)   SpO2 98%   BMI 41.60 kg/m   GENERAL:         Patient has Marked generalized obesity.   HEENT:         Eye exam shows normal external appearance. Fundus exam shows no retinopathy, Left fundus not clearly seen.  Oral exam shows normal mucosa .  NECK:   There is no lymphadenopathy  Thyroid is not enlarged and no nodules felt.  Carotids are normal to palpation and no bruit heard LUNGS:         Chest is symmetrical. Lungs are clear to auscultation.Marland Kitchen   HEART:  Heart sounds:  S1 and S2 are normal. No murmur or click heard., no S3 or S4.   ABDOMEN:   There is no distention present. Liver and spleen are not palpable. No other mass or tenderness present.   NEUROLOGICAL:   Ankle jerks are absent bilaterally.    Diabetic Foot Exam - Simple   Simple Foot Form Diabetic Foot exam was performed with the following findings:  Yes   Visual Inspection See comments:  Yes Sensation Testing Intact to touch and monofilament testing bilaterally:  Yes Pulse Check Posterior Tibialis and Dorsalis pulse intact bilaterally:  Yes See comments:  Yes Comments Edema and skin changes in feet: See rest of physical exam Posterior tibialis not palpable because of edema               Vibration sense is Mildly reduced in distal first  toes. MUSCULOSKELETAL:  There is Swelling of the knees, detailed exam not done She has great difficulty standing and ambulating on her legs  EXTREMITIES:   She has marked edema of the feet bilaterally She has indurated skin on the lower legs with swelling and stasis changes of the skin SKIN:  skin on the dorsum of feet shows a regular reddish rash on the right and more brownish on the left She has significant on onychomycosis of the first toes bilaterally    ASSESSMENT:  Diabetes type 2, obese with BMI 42 Her last A1c was 6.4 with using glipizide since January in a low-dose This indicates very mild diabetes Today glucose is 86 in the lab before supper Since she does not monitor at home not clear what her blood sugars at various times especially postprandially Her diabetes has not progressed since onset Currently she is unable to exercise or lose weight Her diet appears to be reasonably good although does need to have more balanced breakfast with protein      Complications of diabetes: Peripheral neuropathy with mild symptoms although no significant objective findings No history of retinopathy or nephropathy However is overdue for urine microalbumin test  EDEMA: She has long-standing lower leg edema related to venous insufficiency with chronic stasis changes She needs to discuss with her PCP about the rash on her feet especially right  PLAN:    She will start monitoring her blood sugar, Accu-Chek prescription sent  Discussed when to check her blood sugars and blood sugar targets at various times  Because of her mild diabetes she can check her blood sugar once a day or less  She will be seeing the dietitian for meal planning  Will need repeat A1c in 2 months.  Since she has control symptoms of neuropathy at night but not doing today she can take gabapentin as needed during the day also  Recommended annual eye exams  She will have urine microalbumin checked on her next  visit  For her lipids will recheck on the next visit and consider switching Lipitor to Crestor if LDL still over 100  Patient Instructions  Check blood sugars on waking up  weekly   Also check blood sugars about 2 hours after a meal and do this after different meals by rotation  Recommended blood sugar levels on waking up is 90-130 and about 2 hours after meal is 130-160  Please bring your blood sugar monitor to each visit, thank you  [facility]  Take Gabapentin 2x daily as needed    Consultation note has been sent to the referring physician  [facility] 10/11/2016, 8:27  PM   Note: This office note was prepared with Estate agent. Any transcriptional errors that result from this process are unintentional.

## 2017-01-09 ENCOUNTER — Telehealth: Payer: Self-pay | Admitting: Internal Medicine

## 2017-01-09 NOTE — Telephone Encounter (Signed)
Patient Name: Alyssa Ingram  DOB: 1945/09/18    Initial Comment Caller states that she is having right arm pain, knee pain   Nurse Assessment  Nurse: Yolanda Bonine, RN, [place] Date/Time (Eastern Time): 01/09/2017 12:44:13 PM  Confirm and document reason for call. If symptomatic, describe symptoms. ---Caller states that she is having right arm pain, knee pain on the left. NO fever  Does the patient have any new or worsening symptoms? ---Yes  Will a triage be completed? ---Yes  Related visit to physician within the last 2 weeks? ---No  Does the PT have any chronic conditions? (i.e. diabetes, asthma, etc.) ---Yes  List chronic conditions. ---DM HTN CHF  Is this a behavioral health or substance abuse call? ---No     Guidelines    Guideline Title Affirmed Question Affirmed Notes  Arm Pain Weakness (i.e., loss of strength) in hand or fingers (Exception: not truly weak; hand feels weak because of pain)    Final Disposition User   See Physician within 4 Hours (or PCP triage) Yolanda Bonine, RN, [place]    Comments  caller has asthma  caller stated she had no ride to go to the doctor and no one to take her. Nurse mentioned for her to take a taxi and she refused. She did not want nurse to call and make office appt due to not being able to get to the appt. Nurse told her to follow up as soon as possible with MD. She verbalized understanding   Referrals  GO TO [facility] UNDECIDED   Disagree/Comply: Comply

## 2017-03-14 ENCOUNTER — Other Ambulatory Visit: Payer: Self-pay | Admitting: Internal Medicine

## 2017-04-03 ENCOUNTER — Ambulatory Visit: Payer: Medicare HMO | Admitting: Family Medicine

## 2017-04-03 ENCOUNTER — Ambulatory Visit: Payer: Medicare HMO | Admitting: Internal Medicine

## 2017-04-09 ENCOUNTER — Other Ambulatory Visit: Payer: Self-pay | Admitting: Internal Medicine

## 2017-04-16 ENCOUNTER — Other Ambulatory Visit: Payer: Self-pay

## 2017-04-16 MED ORDER — ALBUTEROL SULFATE HFA 108 (90 BASE) MCG/ACT IN AERS: 2.0000 | INHALATION_SPRAY | Freq: Two times a day (BID) | RESPIRATORY_TRACT | 2 refills | Status: DC

## 2017-05-16 ENCOUNTER — Ambulatory Visit: Payer: Medicare HMO | Admitting: Internal Medicine

## 2017-05-16 ENCOUNTER — Ambulatory Visit: Payer: Medicare HMO | Admitting: Family Medicine

## 2017-05-16 NOTE — Progress Notes (Deleted)
[name] [address] [place], [place] [address] Phone: [phone] Subjective:    I'm seeing this patient by the request  of:  [name], MD   CC: Left knee pain follow-up  PYP:[id number]  Alyssa Ingram is a 72 y.o. female coming in with complaint of left knee pain.  Patient was found to have severe osteoarthritic changes.  Last injection was August 24, 2016.  Patient was to continue with conservative therapy including icing regimen, bracing, topical anti-inflammatories and home exercises.  Patient states  Onset-  Location Duration-  Character- Aggravating factors- Reliving factors-  Therapies tried-  Severity-     Past Medical History:  Diagnosis Date  . Abnormal MRI, spine 11/2011   ?discitis L5-S1, s/p CT bx 12/12/11  . Allergic rhinitis, cause unspecified 01/31/2013  . Arthritis   . Asthma   . Cervical cancer ([facility])   . Chronic diastolic heart failure ([facility])   . Diabetic retinopathy ([facility])   . GERD (gastroesophageal reflux disease)   . H/O cardiovascular stress test    Nuclear study in 2011 normal perfusion  . HOCM (hypertrophic obstructive cardiomyopathy) ([facility])    Echo 12/19/11: Severe LVH, EF 55-65%, dynamic obstruction, wall motion normal, grade 1 diastolic dysfunction, systolic anterior motion of the mitral valve with mild MS and mild MR, mild LAE, PASP 34  . Hyperlipidemia   . Hypertension   . Kidney stones   . Obesity   . Type II or unspecified type diabetes mellitus without mention of complication, uncontrolled 01/31/2013   Past Surgical History:  Procedure Laterality Date  . ABDOMINAL HYSTERECTOMY    . JOINT REPLACEMENT     right knee 2006  . rotater cuff     right, 2005   Social History   Socioeconomic History  . Marital status: Widowed    Spouse name: Not on file  . Number of children: 5  . Years of education: 86  . Highest education level: Not on file  Social Needs  . Financial resource strain: Not on file    . Food insecurity - worry: Not on file  . Food insecurity - inability: Not on file  . Transportation needs - medical: Not on file  . Transportation needs - non-medical: Not on file  Occupational History  . Occupation: Retired Optometrist  Tobacco Use  . Smoking status: Never Smoker  . Smokeless tobacco: Never Used  Substance and Sexual Activity  . Alcohol use: No    Alcohol/week: 0.0 oz  . Drug use: No  . Sexual activity: Not on file  Other Topics Concern  . Not on file  Social History Narrative  . Not on file   Allergies  Allergen Reactions  . Sulfa Antibiotics Hives  . Metformin And Related   . Other Hives    Unknown drug  . Prednisone    Family History  Problem Relation Age of Onset  . Heart disease Father   . Arthritis Other   . Heart disease Other   . Hypertension Other   . Diabetes Other   . Alcohol abuse Other   . Arthritis Other   . Cancer Other        Lung Cancer  . Heart disease Other   . Hypertension Other   . Sudden death Other   . Kidney disease Other   . Mental illness Other   . Diabetes Other   . Colon cancer Neg Hx      Past medical  history, social, surgical and family history all reviewed in electronic medical record.  No pertanent information unless stated regarding to the chief complaint.   Review of Systems:Review of systems updated and as accurate as of 05/16/17  No headache, visual changes, nausea, vomiting, diarrhea, constipation, dizziness, abdominal pain, skin rash, fevers, chills, night sweats, weight loss, swollen lymph nodes, body aches, joint swelling, muscle aches, chest pain, shortness of breath, mood changes.   Objective  There were no vitals taken for this visit. Systems examined below as of 05/16/17   General: No apparent distress alert and oriented x3 mood and affect normal, dressed appropriately.  HEENT: Pupils equal, extraocular movements intact  Respiratory: Patient's speak in full sentences and does not appear short of  breath  Cardiovascular: No lower extremity edema, non tender, no erythema  Skin: Warm dry intact with no signs of infection or rash on extremities or on axial skeleton.  Abdomen: Soft nontender  Neuro: Cranial nerves II through XII are intact, neurovascularly intact in all extremities with 2+ DTRs and 2+ pulses.  Lymph: No lymphadenopathy of posterior or anterior cervical chain or axillae bilaterally.  Gait normal with good balance and coordination.  MSK:  Non tender with full range of motion and good stability and symmetric strength and tone of shoulders, elbows, wrist, hip, and ankles bilaterally.  Knee: valgus deformity noted. Large thigh to calf ratio.  Tender to palpation over medial and PF joint line.  ROM full in flexion and extension and lower leg rotation. instability with valgus force.  painful patellar compression. Patellar glide with moderate crepitus. Patellar and quadriceps tendons unremarkable. Hamstring and quadriceps strength is normal. Contralateral knee shows    Impression and Recommendations:     This case required medical decision making of moderate complexity.      Note: This dictation was prepared with Dragon dictation along with smaller phrase technology. Any transcriptional errors that result from this process are unintentional.

## 2017-06-12 NOTE — Progress Notes (Deleted)
[name] [place], [place] [address] Phone: [phone] Subjective:    I'm seeing this patient by the request  of:    CC: Left knee follow-up  [id number]  Alyssa Ingram is a 72 y.o. female coming in with complaint of left knee pain.  Found to have degenerative arthritis of the knee.  Last seen 9 months ago.  Given injection at that time.  Patient states    Past Medical History:  Diagnosis Date  . Abnormal MRI, spine 11/2011   ?discitis L5-S1, s/p CT bx 12/12/11  . Allergic rhinitis, cause unspecified 01/31/2013  . Arthritis   . Asthma   . Cervical cancer ([facility])   . Chronic diastolic heart failure ([facility])   . Diabetic retinopathy ([facility])   . GERD (gastroesophageal reflux disease)   . H/O cardiovascular stress test    Nuclear study in 2011 normal perfusion  . HOCM (hypertrophic obstructive cardiomyopathy) ([facility])    Echo 12/19/11: Severe LVH, EF 55-65%, dynamic obstruction, wall motion normal, grade 1 diastolic dysfunction, systolic anterior motion of the mitral valve with mild MS and mild MR, mild LAE, PASP 34  . Hyperlipidemia   . Hypertension   . Kidney stones   . Obesity   . Type II or unspecified type diabetes mellitus without mention of complication, uncontrolled 01/31/2013   Past Surgical History:  Procedure Laterality Date  . ABDOMINAL HYSTERECTOMY    . JOINT REPLACEMENT     right knee 2006  . rotater cuff     right, 2005   Social History   Socioeconomic History  . Marital status: Widowed    Spouse name: Not on file  . Number of children: 5  . Years of education: 11  . Highest education level: Not on file  Social Needs  . Financial resource strain: Not on file  . Food insecurity - worry: Not on file  . Food insecurity - inability: Not on file  . Transportation needs - medical: Not on file  . Transportation needs - non-medical: Not on file  Occupational History  . Occupation: Retired Optometrist  Tobacco Use  .  Smoking status: Never Smoker  . Smokeless tobacco: Never Used  Substance and Sexual Activity  . Alcohol use: No    Alcohol/week: 0.0 oz  . Drug use: No  . Sexual activity: Not on file  Other Topics Concern  . Not on file  Social History Narrative  . Not on file   Allergies  Allergen Reactions  . Sulfa Antibiotics Hives  . Metformin And Related   . Other Hives    Unknown drug  . Prednisone    Family History  Problem Relation Age of Onset  . Heart disease Father   . Arthritis Other   . Heart disease Other   . Hypertension Other   . Diabetes Other   . Alcohol abuse Other   . Arthritis Other   . Cancer Other        Lung Cancer  . Heart disease Other   . Hypertension Other   . Sudden death Other   . Kidney disease Other   . Mental illness Other   . Diabetes Other   . Colon cancer Neg Hx      Past medical history, social, surgical and family history all reviewed in electronic medical record.  No pertanent information unless stated regarding to the chief complaint.   Review of Systems:Review of systems updated and as accurate as  of 06/12/17  No headache, visual changes, nausea, vomiting, diarrhea, constipation, dizziness, abdominal pain, skin rash, fevers, chills, night sweats, weight loss, swollen lymph nodes, body aches, joint swelling, muscle aches, chest pain, shortness of breath, mood changes.   Objective  There were no vitals taken for this visit. Systems examined below as of 06/12/17   General: No apparent distress alert and oriented x3 mood and affect normal, dressed appropriately.  HEENT: Pupils equal, extraocular movements intact  Respiratory: Patient's speak in full sentences and does not appear short of breath  Cardiovascular: No lower extremity edema, non tender, no erythema  Skin: Warm dry intact with no signs of infection or rash on extremities or on axial skeleton.  Abdomen: Soft nontender  Neuro: Cranial nerves II through XII are intact,  neurovascularly intact in all extremities with 2+ DTRs and 2+ pulses.  Lymph: No lymphadenopathy of posterior or anterior cervical chain or axillae bilaterally.  Gait normal with good balance and coordination.  MSK:  Non tender with full range of motion and good stability and symmetric strength and tone of shoulders, elbows, wrist, hip, knee and ankles bilaterally.     Impression and Recommendations:     This case required medical decision making of moderate complexity.      Note: This dictation was prepared with Dragon dictation along with smaller phrase technology. Any transcriptional errors that result from this process are unintentional.

## 2017-06-13 ENCOUNTER — Ambulatory Visit: Payer: Medicare HMO | Admitting: Internal Medicine

## 2017-06-13 ENCOUNTER — Ambulatory Visit: Payer: Medicare HMO | Admitting: Family Medicine

## 2017-06-14 ENCOUNTER — Ambulatory Visit: Payer: Self-pay | Admitting: Podiatry

## 2017-06-20 ENCOUNTER — Ambulatory Visit: Payer: Medicare HMO | Admitting: Family Medicine

## 2017-06-20 ENCOUNTER — Other Ambulatory Visit (INDEPENDENT_AMBULATORY_CARE_PROVIDER_SITE_OTHER): Payer: Medicare HMO

## 2017-06-20 ENCOUNTER — Encounter: Payer: Self-pay | Admitting: Internal Medicine

## 2017-06-20 ENCOUNTER — Encounter: Payer: Self-pay | Admitting: Family Medicine

## 2017-06-20 ENCOUNTER — Ambulatory Visit (INDEPENDENT_AMBULATORY_CARE_PROVIDER_SITE_OTHER): Payer: Medicare HMO | Admitting: Internal Medicine

## 2017-06-20 VITALS — BP 134/82 | HR 60 | Temp 98.2°F | Ht 60.0 in | Wt 225.0 lb

## 2017-06-20 DIAGNOSIS — I872 Venous insufficiency (chronic) (peripheral): Secondary | ICD-10-CM | POA: Diagnosis not present

## 2017-06-20 DIAGNOSIS — E119 Type 2 diabetes mellitus without complications: Secondary | ICD-10-CM

## 2017-06-20 DIAGNOSIS — M545 Low back pain: Secondary | ICD-10-CM | POA: Diagnosis not present

## 2017-06-20 DIAGNOSIS — J309 Allergic rhinitis, unspecified: Secondary | ICD-10-CM

## 2017-06-20 DIAGNOSIS — I10 Essential (primary) hypertension: Secondary | ICD-10-CM

## 2017-06-20 DIAGNOSIS — M1712 Unilateral primary osteoarthritis, left knee: Secondary | ICD-10-CM

## 2017-06-20 DIAGNOSIS — Z23 Encounter for immunization: Secondary | ICD-10-CM | POA: Diagnosis not present

## 2017-06-20 DIAGNOSIS — R6889 Other general symptoms and signs: Secondary | ICD-10-CM | POA: Diagnosis not present

## 2017-06-20 DIAGNOSIS — Z0001 Encounter for general adult medical examination with abnormal findings: Secondary | ICD-10-CM

## 2017-06-20 DIAGNOSIS — M7711 Lateral epicondylitis, right elbow: Secondary | ICD-10-CM | POA: Insufficient documentation

## 2017-06-20 DIAGNOSIS — E785 Hyperlipidemia, unspecified: Secondary | ICD-10-CM

## 2017-06-20 LAB — URINALYSIS, ROUTINE W REFLEX MICROSCOPIC
Hgb urine dipstick: NEGATIVE
KETONES UR: NEGATIVE
Leukocytes, UA: NEGATIVE
NITRITE: NEGATIVE
RBC / HPF: NONE SEEN (ref 0–?)
SPECIFIC GRAVITY, URINE: 1.025 (ref 1.000–1.030)
URINE GLUCOSE: NEGATIVE
UROBILINOGEN UA: 1 (ref 0.0–1.0)
pH: 6 (ref 5.0–8.0)

## 2017-06-20 LAB — CBC WITH DIFFERENTIAL/PLATELET
Basophils Absolute: 0 10*3/uL (ref 0.0–0.1)
Basophils Relative: 0.5 % (ref 0.0–3.0)
EOS PCT: 2.9 % (ref 0.0–5.0)
Eosinophils Absolute: 0.2 10*3/uL (ref 0.0–0.7)
HCT: 36.2 % (ref 36.0–46.0)
HEMOGLOBIN: 11.7 g/dL — AB (ref 12.0–15.0)
Lymphocytes Relative: 29.2 % (ref 12.0–46.0)
Lymphs Abs: 1.5 10*3/uL (ref 0.7–4.0)
MCHC: 32.4 g/dL (ref 30.0–36.0)
MCV: 83.7 fl (ref 78.0–100.0)
MONOS PCT: 11.6 % (ref 3.0–12.0)
Monocytes Absolute: 0.6 10*3/uL (ref 0.1–1.0)
Neutro Abs: 2.9 10*3/uL (ref 1.4–7.7)
Neutrophils Relative %: 55.8 % (ref 43.0–77.0)
Platelets: 238 10*3/uL (ref 150.0–400.0)
RBC: 4.33 Mil/uL (ref 3.87–5.11)
RDW: 16.6 % — ABNORMAL HIGH (ref 11.5–15.5)
WBC: 5.2 10*3/uL (ref 4.0–10.5)

## 2017-06-20 LAB — BASIC METABOLIC PANEL
BUN: 17 mg/dL (ref 6–23)
CALCIUM: 9.3 mg/dL (ref 8.4–10.5)
CO2: 28 mEq/L (ref 19–32)
CREATININE: 0.82 mg/dL (ref 0.40–1.20)
Chloride: 104 mEq/L (ref 96–112)
GFR: 72.87 mL/min (ref >60.00)
Glucose, Bld: 94 mg/dL (ref 70–99)
Potassium: 3.8 mEq/L (ref 3.5–5.1)
Sodium: 139 mEq/L (ref 135–145)

## 2017-06-20 LAB — TSH: TSH: 0.52 u[IU]/mL (ref 0.35–4.50)

## 2017-06-20 LAB — LIPID PANEL
CHOL/HDL RATIO: 3
CHOLESTEROL: 141 mg/dL (ref 0–200)
HDL: 45 mg/dL (ref >39.00)
LDL Cholesterol: 75 mg/dL (ref 0–99)
NonHDL: 96.28
TRIGLYCERIDES: 105 mg/dL (ref 0.0–149.0)
VLDL: 21 mg/dL (ref 0.0–40.0)

## 2017-06-20 LAB — HEPATIC FUNCTION PANEL
ALBUMIN: 3.8 g/dL (ref 3.5–5.2)
ALK PHOS: 85 U/L (ref 39–117)
ALT: 24 U/L (ref 0–35)
AST: 24 U/L (ref 0–37)
Bilirubin, Direct: 0.1 mg/dL (ref 0.0–0.3)
TOTAL PROTEIN: 7.6 g/dL (ref 6.0–8.3)
Total Bilirubin: 0.7 mg/dL (ref 0.2–1.2)

## 2017-06-20 LAB — MICROALBUMIN / CREATININE URINE RATIO
Creatinine,U: 177.3 mg/dL
MICROALB UR: 4.5 mg/dL — AB (ref 0.0–1.9)
Microalb Creat Ratio: 2.5 mg/g (ref 0.0–30.0)

## 2017-06-20 LAB — HEMOGLOBIN A1C: Hgb A1c MFr Bld: 6.7 % — ABNORMAL HIGH (ref 4.6–6.5)

## 2017-06-20 MED ORDER — TIZANIDINE HCL 4 MG PO TABS: 4.0000 mg | ORAL_TABLET | Freq: Four times a day (QID) | ORAL | 1 refills | Status: DC | PRN

## 2017-06-20 MED ORDER — MECLIZINE HCL 25 MG PO TABS: ORAL_TABLET | ORAL | 2 refills | Status: DC

## 2017-06-20 MED ORDER — TRAMADOL HCL 50 MG PO TABS: 50.0000 mg | ORAL_TABLET | Freq: Three times a day (TID) | ORAL | 1 refills | Status: DC | PRN

## 2017-06-20 MED ORDER — CETIRIZINE HCL 10 MG PO TABS: ORAL_TABLET | ORAL | 3 refills | Status: DC

## 2017-06-20 MED ORDER — PANTOPRAZOLE SODIUM 40 MG PO TBEC: 40.0000 mg | DELAYED_RELEASE_TABLET | Freq: Every day | ORAL | 3 refills | Status: DC

## 2017-06-20 NOTE — Progress Notes (Signed)
 Subjective:    Patient ID: Alyssa Ingram, female    DOB: 09/09/1945, 72 y.o.   MRN: [id number]  HPI  Here for wellness and f/u;  Overall doing ok;  Pt denies Chest pain, worsening SOB, DOE, wheezing, orthopnea, PND, worsening LE edema, palpitations, dizziness or syncope.  Pt denies neurological change such as new headache, facial or extremity weakness.  Pt denies polydipsia, polyuria, or low sugar symptoms. Pt states overall good compliance with treatment and medications, good tolerability, and has been trying to follow appropriate diet.  Pt denies worsening depressive symptoms, suicidal ideation or panic. No fever, night sweats, wt loss, loss of appetite, or other constitutional symptoms.  Pt states good ability with ADL's, has low fall risk, home safety reviewed and adequate, no other significant changes in hearing or vision, and not active with exercise but plans to try being more active.  Due for optho f/u.   Also, Does have several wks ongoing nasal allergy symptoms with clearish congestion, itch and sneezing, without fever, pain, ST, cough, swelling or wheezing.   Also pt continues to have recurring right LBP without bowel or bladder change, fever, wt loss,  worsening LE pain/numbness/weakness, gait change or falls. Also has other pain to both knees, right elbow. No other complaints or interval hx Past Medical History:  Diagnosis Date  . Abnormal MRI, spine 11/2011   ?discitis L5-S1, s/p CT bx 12/12/11  . Allergic rhinitis, cause unspecified 01/31/2013  . Arthritis   . Asthma   . Cervical cancer ([facility])   . Chronic diastolic heart failure ([facility])   . Diabetic retinopathy ([facility])   . GERD (gastroesophageal reflux disease)   . H/O cardiovascular stress test    Nuclear study in 2011 normal perfusion  . HOCM (hypertrophic obstructive cardiomyopathy) ([facility])    Echo 12/19/11: Severe LVH, EF 55-65%, dynamic obstruction, wall motion normal, grade 1 diastolic dysfunction, systolic anterior motion of the  mitral valve with mild MS and mild MR, mild LAE, PASP 34  . Hyperlipidemia   . Hypertension   . Kidney stones   . Obesity   . Type II or unspecified type diabetes mellitus without mention of complication, uncontrolled 01/31/2013   Past Surgical History:  Procedure Laterality Date  . ABDOMINAL HYSTERECTOMY    . JOINT REPLACEMENT     right knee 2006  . rotater cuff     right, 2005    reports that  has never smoked. she has never used smokeless tobacco. She reports that she does not drink alcohol or use drugs. family history includes Alcohol abuse in her other; Arthritis in her other and other; Cancer in her other; Diabetes in her other and other; Heart disease in her father, other, and other; Hypertension in her other and other; Kidney disease in her other; Mental illness in her other; Sudden death in her other. Allergies  Allergen Reactions  . Sulfa Antibiotics Hives  . Metformin And Related   . Other Hives    Unknown drug  . Prednisone    Current Outpatient Medications on File Prior to Visit  Medication Sig Dispense Refill  . albuterol (ACCUNEB) 0.63 MG/3ML nebulizer solution Take 3 mLs (0.63 mg total) by nebulization every 6 (six) hours as needed for wheezing. 75 mL 12  . albuterol (PROAIR HFA) 108 (90 Base) MCG/ACT inhaler Inhale 2 puffs into the lungs 2 (two) times daily. 8.5 Inhaler 2  . albuterol (VENTOLIN HFA) 108 (90 Base) MCG/ACT inhaler Inhale 2 puffs into the lungs 2 (  two) times daily. 18 Inhaler 2  . allopurinol (ZYLOPRIM) 100 MG tablet Take 1 tablet (100 mg total) by mouth daily. 90 tablet 3  . aspirin 81 MG tablet Take 162 mg by mouth daily.     . atorvastatin (LIPITOR) 40 MG tablet Take 1 tablet (40 mg total) by mouth daily. 90 tablet 3  . Blood Glucose Monitoring Suppl (ACCU-CHEK AVIVA PLUS) w/Device KIT Use to check blood sugar three times daily 1 kit 2  . gabapentin (NEURONTIN) 300 MG capsule TAKE 1 TO 2 TABLETS BY MOUTH AT BEDTIME 60 capsule 5  . glipiZIDE  (GLIPIZIDE XL) 2.5 MG 24 hr tablet Take 1 tablet (2.5 mg total) by mouth daily with breakfast. 90 tablet 3  . glucose blood (ACCU-CHEK AVIVA PLUS) test strip Use to check blood sugar three times daily 300 each 3  . metoprolol (LOPRESSOR) 50 MG tablet Take 1.5 tablets (75 mg total) by mouth 2 (two) times daily. 270 tablet 3  . ONETOUCH DELICA LANCETS 33G MISC USE TO CHECK BLOOD SUGAR ONCE DAILY 100 each 1  . triamcinolone (NASACORT AQ) 55 MCG/ACT AERO nasal inhaler Place 2 sprays into the nose daily. 1 Inhaler 12   No current facility-administered medications on file prior to visit.    Review of Systems Constitutional: Negative for other unusual diaphoresis, sweats, appetite or weight changes HENT: Negative for other worsening hearing loss, ear pain, facial swelling, mouth sores or neck stiffness.   Eyes: Negative for other worsening pain, redness or other visual disturbance.  Respiratory: Negative for other stridor or swelling Cardiovascular: Negative for other palpitations or other chest pain  Gastrointestinal: Negative for worsening diarrhea or loose stools, blood in stool, distention or other pain Genitourinary: Negative for hematuria, flank pain or other change in urine volume.  Musculoskeletal: Negative for myalgias or other joint swelling.  Skin: Negative for other color change, or other wound or worsening drainage.  Neurological: Negative for other syncope or numbness. Hematological: Negative for other adenopathy or swelling Psychiatric/Behavioral: Negative for hallucinations, other worsening agitation, SI, self-injury, or new decreased concentration\ All other system neg per pt    Objective:   Physical Exam BP 134/82   Pulse 60   Temp 98.2 F (36.8 C) (Oral)   Ht 5' (1.524 m)   Wt 225 lb (102.1 kg)   SpO2 97%   BMI 43.94 kg/m  VS noted,  Constitutional: Pt is oriented to person, place, and time. Appears well-developed and well-nourished, in no significant distress and  comfortable Head: Normocephalic and atraumatic  Eyes: Conjunctivae and EOM are normal. Pupils are equal, round, and reactive to light Right Ear: External ear normal without discharge Left Ear: External ear normal without discharge Nose: Nose without discharge or deformity Bilat tm's with mild erythema.  Max sinus areas non tender.  Pharynx with mild erythema, no exudate  Mouth/Throat: Oropharynx is without other ulcerations and moist  Neck: Normal range of motion. Neck supple. No JVD present. No tracheal deviation present or significant neck LA or mass Cardiovascular: Normal rate, regular rhythm, normal heart sounds and intact distal pulses.   Pulmonary/Chest: WOB normal and breath sounds without rales or wheezing  Abdominal: Soft. Bowel sounds are normal. NT. No HSM  Musculoskeletal: Normal range of motion. Exhibits trace bilat edema, spine nontender Lymphadenopathy: Has no other cervical adenopathy.  Neurological: Pt is alert and oriented to person, place, and time. Pt has normal reflexes. No cranial nerve deficit. Motor grossly intact, Gait intact Skin: Skin is warm and dry.   No rash noted or new ulcerations Psychiatric:  Has normal mood and affect. Behavior is normal without agitation No other exam findings  Lab Results  Component Value Date   HGBA1C 6.4 08/24/2016      Assessment & Plan:

## 2017-06-20 NOTE — Progress Notes (Signed)
[name] [place], [place] [address] Phone: [phone] Subjective:    I'm seeing this patient by the request  of:    CC: Left knee pain and right elbow pain  [id number]  Alyssa Ingram is a 72 y.o. female coming in with complaint of left knee pain and right elbow pain Left knee pain is been going on for quite some time.  Found to have degenerative arthritis.  Patient has had worsening symptoms recently.  Describes it as a dull, throbbing aching sensation.  States that there is some increasing instability.  Notices swelling.  Decreasing range of motion.  Right elbow pain.  Worse with picking certain things up.  States that it can be very sore in the morning as well.  Points to the lateral epicondylar region.    Past Medical History:  Diagnosis Date  . Abnormal MRI, spine 11/2011   ?discitis L5-S1, s/p CT bx 12/12/11  . Allergic rhinitis, cause unspecified 01/31/2013  . Arthritis   . Asthma   . Cervical cancer ([facility])   . Chronic diastolic heart failure ([facility])   . Diabetic retinopathy ([facility])   . GERD (gastroesophageal reflux disease)   . H/O cardiovascular stress test    Nuclear study in 2011 normal perfusion  . HOCM (hypertrophic obstructive cardiomyopathy) ([facility])    Echo 12/19/11: Severe LVH, EF 55-65%, dynamic obstruction, wall motion normal, grade 1 diastolic dysfunction, systolic anterior motion of the mitral valve with mild MS and mild MR, mild LAE, PASP 34  . Hyperlipidemia   . Hypertension   . Kidney stones   . Obesity   . Type II or unspecified type diabetes mellitus without mention of complication, uncontrolled 01/31/2013   Past Surgical History:  Procedure Laterality Date  . ABDOMINAL HYSTERECTOMY    . JOINT REPLACEMENT     right knee 2006  . rotater cuff     right, 2005   Social History   Socioeconomic History  . Marital status: Widowed    Spouse name: Not on file  . Number of children: 5  . Years of education: 43    . Highest education level: Not on file  Social Needs  . Financial resource strain: Not on file  . Food insecurity - worry: Not on file  . Food insecurity - inability: Not on file  . Transportation needs - medical: Not on file  . Transportation needs - non-medical: Not on file  Occupational History  . Occupation: Retired Optometrist  Tobacco Use  . Smoking status: Never Smoker  . Smokeless tobacco: Never Used  Substance and Sexual Activity  . Alcohol use: No    Alcohol/week: 0.0 oz  . Drug use: No  . Sexual activity: Not on file  Other Topics Concern  . Not on file  Social History Narrative  . Not on file   Allergies  Allergen Reactions  . Sulfa Antibiotics Hives  . Metformin And Related   . Other Hives    Unknown drug  . Prednisone    Family History  Problem Relation Age of Onset  . Heart disease Father   . Arthritis Other   . Heart disease Other   . Hypertension Other   . Diabetes Other   . Alcohol abuse Other   . Arthritis Other   . Cancer Other        Lung Cancer  . Heart disease Other   . Hypertension Other   . Sudden death Other   .  Kidney disease Other   . Mental illness Other   . Diabetes Other   . Colon cancer Neg Hx      Past medical history, social, surgical and family history all reviewed in electronic medical record.  No pertanent information unless stated regarding to the chief complaint.   Review of Systems:Review of systems updated and as accurate as of 06/20/17  No headache, visual changes, nausea, vomiting, diarrhea, constipation, dizziness, abdominal pain, skin rash, fevers, chills, night sweats, weight loss, swollen lymph nodes, body aches, joint swelling, muscle aches, chest pain, shortness of breath, mood changes.   Objective  Blood pressure (!) 146/86, pulse 60, height 5' (1.524 m), weight 225 lb (102.1 kg), SpO2 97 %. Systems examined below as of 06/20/17   General: No apparent distress alert and oriented x3 mood and affect normal,  dressed appropriately.  HEENT: Pupils equal, extraocular movements intact  Respiratory: Patient's speak in full sentences and does not appear short of breath  Cardiovascular: 3+ lower extremity edema, tender, no erythema  Abdomen: Soft obese Neuro: Cranial nerves II through XII are intact, neurovascularly intact in all extremities with 2+ DTRs and 2+ pulses.  Lymph: No lymphadenopathy of posterior or anterior cervical chain or axillae bilaterally.  Gait patient is in a wheelchair and nonweightbearing MSK:  Non tender with decreased range of motion aand symmetric strength and tone of shoulders, wrist, hip, and ankles bilaterally.  Moderate arthritic changes of multiple joints Knee: Left valgus deformity noted. Large thigh to calf ratio.  Tender to palpation over medial and PF joint line.  Severe decreasing range of motion in all planes instability with valgus force.  painful patellar compression. Patellar glide with moderate crepitus. Patellar and quadriceps tendons unremarkable. Hamstring and quadriceps strength is normal. Contralateral knee shows knee replacement.  Right elbow has near full range of motion but does have tenderness over the lateral.  Pain with resisted wrist extension  After informed written and verbal consent, patient was seated on exam table. Left knee was prepped with alcohol swab and utilizing anterolateral approach, patient's left knee space was injected with 4:1  marcaine 0.5%: Kenalog 40mg /dL. Patient tolerated the procedure well without immediate complications.   Impression and Recommendations:     This case required medical decision making of moderate complexity.      Note: This dictation was prepared with Dragon dictation along with smaller phrase technology. Any transcriptional errors that result from this process are unintentional.

## 2017-06-20 NOTE — Patient Instructions (Signed)
Good to see you  Alyssa Ingram is your friend. Ice 20 minutes 2 times daily. Usually after activity and before bed. Add tart cherry extract any dose at night Can help with gout and with arthritis.  pennsaid pinkie amount topically 2 times daily as needed.   Keep trying to be active See me again in 6 weeks if you need me.

## 2017-06-20 NOTE — Assessment & Plan Note (Signed)
Injection given today.  Tolerated procedure well.  Discussed home exercises and topical anti-inflammatories.  Do not believe that patient would do well with formal physical therapy at this time.  Follow-up again in 4 weeks

## 2017-06-20 NOTE — Assessment & Plan Note (Signed)
Repeat injection given today.  Tolerated the procedure well.  Will monitor blood glucose closely.  Topical anti-inflammatories given.  May need more aggressive therapy for patient's gout but has many comorbidities.  We discussed icing regimen.  Home exercises.  Follow-up again 4 weeks  Could be a candidate for Visco supplementation

## 2017-06-20 NOTE — Patient Instructions (Addendum)
You had the flu shot today  You should be signed up by [facility] for the Cologuard  Your repeat BP was 134/82  Please take all new medication as prescribed - the zyrtec and nasacort for the allergies and sore throat  Please take all new medication as prescribed - the muscle relaxer as needed   Please continue all other medications as before, and refills have been done if requested.  Please have the pharmacy call with any other refills you may need.  Please continue your efforts at being more active, low cholesterol diet, and weight control.  You are otherwise up to date with prevention measures today.  Please keep your appointments with your specialists as you may have planned  Please go to the LAB in the Basement (turn left off the elevator) for the tests to be done today  You will be contacted by phone if any changes need to be made immediately.  Otherwise, you will receive a letter about your results with an explanation, but please check with MyChart first.  Please remember to sign up for MyChart if you have not done so, as this will be important to you in the future with finding out test results, communicating by private email, and scheduling acute appointments online when needed.  Please return in 6 months, or sooner if needed, with Lab testing done 3-5 days before, if possible

## 2017-06-20 NOTE — Assessment & Plan Note (Signed)
Severe discussed the importance of compression and elevation

## 2017-06-21 DIAGNOSIS — M545 Low back pain, unspecified: Secondary | ICD-10-CM | POA: Insufficient documentation

## 2017-06-21 NOTE — Assessment & Plan Note (Signed)
BP Readings from Last 3 Encounters:  06/20/17 (!) 146/86  06/20/17 134/82  10/11/16 138/84  mild elevated likely situaitional, o/w stable overall by history and exam, recent data reviewed with pt, and pt to continue medical treatment as before,  to f/u any worsening symptoms or concerns

## 2017-06-21 NOTE — Assessment & Plan Note (Signed)
Lab Results  Component Value Date   LDLCALC 75 06/20/2017  stable overall by history and exam, recent data reviewed with pt, and pt to continue medical treatment as before,  to f/u any worsening symptoms or concerns

## 2017-06-21 NOTE — Assessment & Plan Note (Signed)
Mild to mod, etiology unclear, no worsening neuro changes, for zanaflex prn, to f/u any worsening symptoms or concerns

## 2017-06-21 NOTE — Assessment & Plan Note (Signed)

## 2017-06-21 NOTE — Assessment & Plan Note (Signed)
Lab Results  Component Value Date   HGBA1C 6.7 (H) 06/20/2017   stable overall by history and exam, recent data reviewed with pt, and pt to continue medical treatment as before,  to f/u any worsening symptoms or concerns

## 2017-06-21 NOTE — Assessment & Plan Note (Addendum)
Mild to mod, for zyrtec and nasacort asd, to f/u any worsening symptoms or concerns  In addition to the time spent performing CPE, I spent an additional 15 minutes face to face,in which greater than 50% of this time was spent in counseling and coordination of care for patient's illness as documented, including the differential dx, treatment, further evaluation and other management of allergic rhinitis, low back pain, HLD, DM and HTN

## 2017-07-04 ENCOUNTER — Other Ambulatory Visit: Payer: Self-pay | Admitting: Internal Medicine

## 2017-07-05 ENCOUNTER — Encounter: Payer: Self-pay | Admitting: Podiatry

## 2017-07-05 ENCOUNTER — Ambulatory Visit: Payer: Medicare HMO

## 2017-07-05 ENCOUNTER — Ambulatory Visit: Payer: Medicare HMO | Admitting: Podiatry

## 2017-07-05 DIAGNOSIS — M2142 Flat foot [pes planus] (acquired), left foot: Secondary | ICD-10-CM | POA: Diagnosis not present

## 2017-07-05 DIAGNOSIS — M79674 Pain in right toe(s): Secondary | ICD-10-CM | POA: Diagnosis not present

## 2017-07-05 DIAGNOSIS — M79675 Pain in left toe(s): Secondary | ICD-10-CM

## 2017-07-05 DIAGNOSIS — B351 Tinea unguium: Secondary | ICD-10-CM | POA: Diagnosis not present

## 2017-07-05 DIAGNOSIS — E1149 Type 2 diabetes mellitus with other diabetic neurological complication: Secondary | ICD-10-CM

## 2017-07-05 DIAGNOSIS — M2141 Flat foot [pes planus] (acquired), right foot: Secondary | ICD-10-CM | POA: Diagnosis not present

## 2017-07-05 DIAGNOSIS — R6889 Other general symptoms and signs: Secondary | ICD-10-CM | POA: Diagnosis not present

## 2017-07-05 NOTE — Progress Notes (Signed)
Subjective:    Patient ID: Alyssa Ingram, female    DOB: [birth year], 72 y.o.   MRN: [id number]  HPI  Chief Complaint  Patient presents with  . Diabetes    [place]br[place]  . Peripheral Neuropathy    B/L bottom of feet    72 year old female presents the office today for concerns of thick, painful, elongated toenails that she could not trim herself.  She states the nails get irritated with shoes.  She also states that she is supposed be taking gabapentin 3 times a day but she takes it once a day.  She still gets burning and tingling to her feet.  The one gabapentin it does not help and she states that she does not take it as scheduled because she forgets to take it.  She has no side effects the medication.  She denies any open sores.  She is requesting diabetic shoes.  She has no other concerns today.    Review of Systems  All other systems reviewed and are negative.  Past Medical History:  Diagnosis Date  . Abnormal MRI, spine 11/2011   ?discitis L5-S1, s/p CT bx 12/12/11  . Allergic rhinitis, cause unspecified 01/31/2013  . Arthritis   . Asthma   . Cervical cancer ([place])   . Chronic diastolic heart failure ([place])   . Diabetic retinopathy ([place])   . GERD (gastroesophageal reflux disease)   . H/O cardiovascular stress test    Nuclear study in 2011 normal perfusion  . HOCM (hypertrophic obstructive cardiomyopathy) ([place])    Echo 12/19/11: Severe LVH, EF 55-65%, dynamic obstruction, wall motion normal, grade 1 diastolic dysfunction, systolic anterior motion of the mitral valve with mild MS and mild MR, mild LAE, PASP 34  . Hyperlipidemia   . Hypertension   . Kidney stones   . Obesity   . Type II or unspecified type diabetes mellitus without mention of complication, uncontrolled 01/31/2013    Past Surgical History:  Procedure Laterality Date  . ABDOMINAL HYSTERECTOMY    . JOINT REPLACEMENT     right knee 2006  . rotater cuff     right, 2005     Current Outpatient Medications:  .   albuterol (ACCUNEB) 0.63 MG/3ML nebulizer solution, Take 3 mLs (0.63 mg total) by nebulization every 6 (six) hours as needed for wheezing., Disp: 75 mL, Rfl: 12 .  albuterol (PROAIR HFA) 108 (90 Base) MCG/ACT inhaler, Inhale 2 puffs into the lungs 2 (two) times daily., Disp: 8.5 Inhaler, Rfl: 2 .  albuterol (VENTOLIN HFA) 108 (90 Base) MCG/ACT inhaler, Inhale 2 puffs into the lungs 2 (two) times daily., Disp: 18 Inhaler, Rfl: 2 .  allopurinol (ZYLOPRIM) 100 MG tablet, Take 1 tablet (100 mg total) by mouth daily., Disp: 90 tablet, Rfl: 3 .  aspirin 81 MG tablet, Take 162 mg by mouth daily. , Disp: , Rfl:  .  atorvastatin (LIPITOR) 40 MG tablet, Take 1 tablet (40 mg total) by mouth daily., Disp: 90 tablet, Rfl: 3 .  Blood Glucose Monitoring Suppl (ACCU-CHEK AVIVA PLUS) w/Device KIT, Use to check blood sugar three times daily, Disp: 1 kit, Rfl: 2 .  cetirizine (ZYRTEC) 10 MG tablet, TAKE 1 TABLET (10 MG TOTAL) BY MOUTH DAILY as needed, Disp: 90 tablet, Rfl: 3 .  gabapentin (NEURONTIN) 300 MG capsule, TAKE 1 TO 2 TABLETS BY MOUTH AT BEDTIME, Disp: 60 capsule, Rfl: 5 .  glipiZIDE (GLUCOTROL XL) 2.5 MG 24 hr tablet, TAKE 1 TABLET BY MOUTH DAILY WITH BREAKFAST.,  Disp: 90 tablet, Rfl: 3 .  glucose blood (ACCU-CHEK AVIVA PLUS) test strip, Use to check blood sugar three times daily, Disp: 300 each, Rfl: 3 .  meclizine (ANTIVERT) 25 MG tablet, TAKE 1 TABLET (25 MG TOTAL) BY MOUTH 3 (THREE) TIMES DAILY., Disp: 60 tablet, Rfl: 2 .  metoprolol (LOPRESSOR) 50 MG tablet, Take 1.5 tablets (75 mg total) by mouth 2 (two) times daily., Disp: 270 tablet, Rfl: 3 .  ONETOUCH DELICA LANCETS 17P MISC, USE TO CHECK BLOOD SUGAR ONCE DAILY, Disp: 100 each, Rfl: 1 .  pantoprazole (PROTONIX) 40 MG tablet, Take 1 tablet (40 mg total) by mouth daily., Disp: 90 tablet, Rfl: 3 .  tiZANidine (ZANAFLEX) 4 MG tablet, Take 1 tablet (4 mg total) by mouth every 6 (six) hours as needed for muscle spasms., Disp: 50 tablet, Rfl: 1 .  traMADol  (ULTRAM) 50 MG tablet, Take 1 tablet (50 mg total) by mouth every 8 (eight) hours as needed., Disp: 60 tablet, Rfl: 1 .  triamcinolone (NASACORT AQ) 55 MCG/ACT AERO nasal inhaler, Place 2 sprays into the nose daily., Disp: 1 Inhaler, Rfl: 12  Allergies  Allergen Reactions  . Sulfa Antibiotics Hives  . Metformin And Related   . Other Hives    Unknown drug  . Prednisone     Social History   Socioeconomic History  . Marital status: Widowed    Spouse name: Not on file  . Number of children: 5  . Years of education: 58  . Highest education level: Not on file  Social Needs  . Financial resource strain: Not on file  . Food insecurity - worry: Not on file  . Food insecurity - inability: Not on file  . Transportation needs - medical: Not on file  . Transportation needs - non-medical: Not on file  Occupational History  . Occupation: Retired Optometrist  Tobacco Use  . Smoking status: Never Smoker  . Smokeless tobacco: Never Used  Substance and Sexual Activity  . Alcohol use: No    Alcohol/week: 0.0 oz  . Drug use: No  . Sexual activity: Not on file  Other Topics Concern  . Not on file  Social History Narrative  . Not on file        Objective:   Physical Exam General: AAO x3, NAD  Dermatological: Nails are hypertrophic, dystrophic, brittle, discolored, elongated 10. No surrounding redness or drainage. Tenderness nails 1-5 bilaterally. No open lesions or pre-ulcerative lesions are identified today.  Vascular: Dorsalis Pedis artery and Posterior Tibial artery pedal pulses are 1/4 bilateral with immedate capillary fill time.  There is normal proximal distal cooling.  She denies any claudication symptoms.  Bilateral ankle edema.  There is no pain with calf compression, swelling, warmth, erythema.   Neruologic: Sensation decreased with Derrel Nip monofilament.  Musculoskeletal: Flatfoot deformity is present.  Muscular strength 5/5 in all groups tested bilateral.  Gait:  Unassisted, Nonantalgic.     Assessment & Plan:  72 year old female with symptomatic onychomycosis, requesting diabetic shoes -Treatment options discussed including all alternatives, risks, and complications -Etiology of symptoms were discussed -Nails debrided 10 without complications or bleeding. -Paperwork was completed today for precertification diabetic shoes. -Daily foot inspection -Follow-up in 3 months or sooner if any problems arise. In the meantime, encouraged to call the office with any questions, concerns, change in symptoms.   Celesta Gentile, DPM

## 2017-07-13 ENCOUNTER — Telehealth: Payer: Self-pay | Admitting: Internal Medicine

## 2017-07-13 MED ORDER — MELOXICAM 15 MG PO TABS: 15.0000 mg | ORAL_TABLET | Freq: Every day | ORAL | 1 refills | Status: DC | PRN

## 2017-07-13 NOTE — Telephone Encounter (Signed)
Ok to change to mobic 15 qd prn - done erx

## 2017-07-13 NOTE — Telephone Encounter (Signed)
Copied from [place] [id number]. Topic: Quick Communication - See Telephone Encounter >> Jul 13, 2017  3:00 PM Arletha Grippe wrote: CRM for notification. See Telephone encounter for:   07/13/17. Pt learned that tramadol contains opioids, and pt does not want to take opioids.  She still needs a pain medicine, but would like to have a different medication.  [facility] [address]

## 2017-07-16 NOTE — Telephone Encounter (Signed)
Called pt, LVM informing her that med has been changed

## 2017-07-17 ENCOUNTER — Other Ambulatory Visit: Payer: Medicare HMO

## 2017-07-25 ENCOUNTER — Other Ambulatory Visit: Payer: Medicare HMO | Admitting: Orthotics

## 2017-07-26 ENCOUNTER — Telehealth: Payer: Self-pay | Admitting: Internal Medicine

## 2017-07-26 ENCOUNTER — Telehealth: Payer: Self-pay | Admitting: Podiatry

## 2017-07-26 DIAGNOSIS — B351 Tinea unguium: Secondary | ICD-10-CM

## 2017-07-26 NOTE — Telephone Encounter (Signed)
Copied from [place] [id number]. Topic: Referral - Request >> Jul 26, 2017  1:38 PM Scherrie Gerlach wrote: Reason for CRM: Dawn with [facility] states pt needs a referral for his appt 07/30/17 with Dr Celesta Gentile She states [facility] medicare is requiring referral again this year.from their PCP

## 2017-07-26 NOTE — Telephone Encounter (Signed)
Spoke to [facility] @ Dr Gwynn Burly office requesting a referral to Dr Jacqualyn Posey from Dr Jenny Reichmann.[employer] hmo requiring for this yr.

## 2017-07-30 ENCOUNTER — Other Ambulatory Visit: Payer: Medicare HMO | Admitting: Orthotics

## 2017-07-30 NOTE — Telephone Encounter (Signed)
Referral done

## 2017-07-30 NOTE — Addendum Note (Signed)
Addended by: Binnie Rail on: 07/30/2017 09:00 AM   Modules accepted: Orders

## 2017-07-30 NOTE — Telephone Encounter (Signed)
Please advise in Dr. Judi Cong absence. Thank you!

## 2017-08-06 ENCOUNTER — Other Ambulatory Visit: Payer: Medicare HMO | Admitting: Orthotics

## 2017-08-13 ENCOUNTER — Ambulatory Visit: Payer: Medicare HMO | Admitting: Orthotics

## 2017-08-13 DIAGNOSIS — M2141 Flat foot [pes planus] (acquired), right foot: Secondary | ICD-10-CM

## 2017-08-13 DIAGNOSIS — M2142 Flat foot [pes planus] (acquired), left foot: Secondary | ICD-10-CM

## 2017-08-13 DIAGNOSIS — E1149 Type 2 diabetes mellitus with other diabetic neurological complication: Secondary | ICD-10-CM

## 2017-08-13 NOTE — Progress Notes (Signed)
Patient was seen today for diabetic shoes and custom inserts.   Patient has several conditions that puts her foot at risk to conditions secondary to DM2.  She has a lot of fluid build up b/l and is wearing ill fitting shoes.  I advised her that her footwear selection puts her at risk for skin breakdown that could lead to ucleration/amputation.   She measures an 8 xw but I am ordering an 9.5 XW healing type of shoe; if this is problematic, then I recommend casting for custom shoe.  [id number]

## 2017-08-16 ENCOUNTER — Other Ambulatory Visit: Payer: Self-pay | Admitting: Internal Medicine

## 2017-09-04 ENCOUNTER — Other Ambulatory Visit: Payer: Self-pay | Admitting: Internal Medicine

## 2017-09-07 ENCOUNTER — Telehealth: Payer: Self-pay | Admitting: Podiatry

## 2017-09-07 NOTE — Telephone Encounter (Signed)
This is [employer]. I was there around 3 weeks ago to be fitted for a shoe and I wanted to know when will the shoe be ready? Also, I need some type of ointment or cream for the bottom of my feet and ankles as the pain really gets severe. Thank you very much. Bye bye.

## 2017-09-10 ENCOUNTER — Telehealth: Payer: Self-pay | Admitting: Podiatry

## 2017-09-10 NOTE — Telephone Encounter (Signed)
Pt left voicemail [phone] asking about diabetic shoes..   Returned call to home number and it went to a fax and then called cell and lvm for pt that shoes are in and to call and schedule an appt to pick them up.Marland KitchenMarland Kitchen

## 2017-09-14 ENCOUNTER — Other Ambulatory Visit: Payer: Self-pay | Admitting: Internal Medicine

## 2017-10-04 ENCOUNTER — Encounter: Payer: Self-pay | Admitting: Podiatry

## 2017-10-04 ENCOUNTER — Ambulatory Visit: Payer: Medicare HMO | Admitting: Podiatry

## 2017-10-04 DIAGNOSIS — M79674 Pain in right toe(s): Secondary | ICD-10-CM | POA: Diagnosis not present

## 2017-10-04 DIAGNOSIS — E1149 Type 2 diabetes mellitus with other diabetic neurological complication: Secondary | ICD-10-CM

## 2017-10-04 DIAGNOSIS — B351 Tinea unguium: Secondary | ICD-10-CM

## 2017-10-04 DIAGNOSIS — M79675 Pain in left toe(s): Secondary | ICD-10-CM

## 2017-10-09 NOTE — Progress Notes (Signed)
Subjective: 72 y.o. returns the office today for painful, elongated, thickened toenails which she cannot trim herself. Denies any redness or drainage around the nails.  She also presents to pick up diabetic shoes.  She states that she has a lot of swelling to her feet today but she did not take her fluid pill and she will take this when she gets home.  Denies any acute changes since last appointment and no new complaints today. Denies any systemic complaints such as fevers, chills, nausea, vomiting.   PCP: [name], MD  Objective: AAO 3, NAD DP/PT pulses palpable, CRT less than 3 seconds Significant increase in swelling to both feet today but she states this is not new for her and she has not taken her water pill today. Nails hypertrophic, dystrophic, elongated, brittle, discolored 10. There is tenderness overlying the nails 1-5 bilaterally. There is no surrounding erythema or drainage along the nail sites. No open lesions or pre-ulcerative lesions are identified. No other areas of tenderness bilateral lower extremities. No overlying edema, erythema, increased warmth. No pain with calf compression, warmth, erythema.  Assessment: Patient presents with symptomatic onychomycosis  Plan: -Treatment options including alternatives, risks, complications were discussed -Nails sharply debrided 10 without complication/bleeding. -Diabetic shoes were tried to be dispensed today however they were not fitting due to the swelling.  October to evaluate her shoes custom shoes and she is in a come back and see him to get measured for this. -At this point is not improving with the pill today is 100 and let me know her to call her primary care doctor. -Discussed daily foot inspection. If there are any changes, to call the office immediately.  -Follow-up in 3 months or sooner if any problems are to arise. In the meantime, encouraged to call the office with any questions, concerns, changes  symptoms.  Celesta Gentile, DPM

## 2017-10-10 ENCOUNTER — Other Ambulatory Visit: Payer: Medicare HMO | Admitting: Orthotics

## 2017-10-16 ENCOUNTER — Other Ambulatory Visit: Payer: Medicare HMO | Admitting: Orthotics

## 2017-10-23 ENCOUNTER — Telehealth: Payer: Self-pay

## 2017-10-23 DIAGNOSIS — I509 Heart failure, unspecified: Secondary | ICD-10-CM

## 2017-10-23 NOTE — Telephone Encounter (Signed)
Copied from [place] [id number]. Topic: Referral - Request >> Oct 23, 2017  2:24 PM Hewitt Shorts wrote: Pt is requesting that a referral be sent to phone [phone] and fax number is [phone] ref -[facility]  For a wheel chair and a walker  Best number [phone]

## 2017-10-24 NOTE — Telephone Encounter (Signed)
Done hardcopy to shirron 

## 2017-10-25 ENCOUNTER — Ambulatory Visit: Payer: Medicare HMO | Admitting: Orthotics

## 2017-10-25 DIAGNOSIS — M79674 Pain in right toe(s): Secondary | ICD-10-CM

## 2017-10-25 DIAGNOSIS — M2141 Flat foot [pes planus] (acquired), right foot: Secondary | ICD-10-CM

## 2017-10-25 DIAGNOSIS — M2142 Flat foot [pes planus] (acquired), left foot: Secondary | ICD-10-CM

## 2017-10-25 DIAGNOSIS — M79675 Pain in left toe(s): Principal | ICD-10-CM

## 2017-10-25 NOTE — Addendum Note (Signed)
Addended by: Juliet Rude on: 10/25/2017 07:31 AM   Modules accepted: Orders

## 2017-10-26 NOTE — Telephone Encounter (Signed)
Faxed

## 2017-10-26 NOTE — Progress Notes (Signed)
Patient cast b/l for custom diabetic shoes; she presents with serious edema which negates being able to fit into an OTS shoe. She tolerated casting procedure well and [place] as shoe.

## 2017-10-26 NOTE — Telephone Encounter (Signed)
Please sign off on Epic order for record keeping.

## 2017-10-26 NOTE — Addendum Note (Signed)
Addended by: [name] on: 10/26/2017 07:54 AM   Modules accepted: Orders

## 2017-10-26 NOTE — Telephone Encounter (Signed)
Done hardcopy to shirron 

## 2017-11-06 ENCOUNTER — Telehealth: Payer: Self-pay | Admitting: Family Medicine

## 2017-11-06 NOTE — Telephone Encounter (Signed)
Copied from [place] [id number]. Topic: Appointment Scheduling - Scheduling Inquiry for Clinic >> Nov 06, 2017  4:43 PM Cecelia Byars, [place] wrote: Reason for CRM: Patient has an  appointment on 11/08/17 at 58 with Dr Ronnald Ramp and she would like a cortisone injection after or before  that visit if possible ,due to transportation issues she is unable to make more than one  trip to the office , and she is in severe pain please advise  [phone] or [phone] it is ok to leave a message .

## 2017-11-07 NOTE — Telephone Encounter (Signed)
Appointment is with Dr Jenny Reichmann tomorrow (11/08/2017). Is this something we can do?

## 2017-11-07 NOTE — Telephone Encounter (Signed)
lmovm to return call.  Dr. Tamala Julian has an opening at 145pm on 11/08/17.

## 2017-11-08 ENCOUNTER — Encounter: Payer: Self-pay | Admitting: Internal Medicine

## 2017-11-08 ENCOUNTER — Encounter: Payer: Self-pay | Admitting: Family Medicine

## 2017-11-08 ENCOUNTER — Other Ambulatory Visit (INDEPENDENT_AMBULATORY_CARE_PROVIDER_SITE_OTHER): Payer: Medicare HMO

## 2017-11-08 ENCOUNTER — Ambulatory Visit (INDEPENDENT_AMBULATORY_CARE_PROVIDER_SITE_OTHER): Payer: Medicare HMO | Admitting: Internal Medicine

## 2017-11-08 ENCOUNTER — Ambulatory Visit: Payer: Medicare HMO | Admitting: Family Medicine

## 2017-11-08 VITALS — BP 124/84 | HR 53 | Ht 60.0 in

## 2017-11-08 DIAGNOSIS — I10 Essential (primary) hypertension: Secondary | ICD-10-CM

## 2017-11-08 DIAGNOSIS — M25511 Pain in right shoulder: Secondary | ICD-10-CM

## 2017-11-08 DIAGNOSIS — G8929 Other chronic pain: Secondary | ICD-10-CM | POA: Diagnosis not present

## 2017-11-08 DIAGNOSIS — E785 Hyperlipidemia, unspecified: Secondary | ICD-10-CM

## 2017-11-08 DIAGNOSIS — M1712 Unilateral primary osteoarthritis, left knee: Secondary | ICD-10-CM

## 2017-11-08 DIAGNOSIS — I872 Venous insufficiency (chronic) (peripheral): Secondary | ICD-10-CM

## 2017-11-08 DIAGNOSIS — E119 Type 2 diabetes mellitus without complications: Secondary | ICD-10-CM | POA: Diagnosis not present

## 2017-11-08 LAB — LIPID PANEL
CHOLESTEROL: 202 mg/dL — AB (ref 0–200)
HDL: 46.1 mg/dL (ref >39.00)
LDL Cholesterol: 131 mg/dL — ABNORMAL HIGH (ref 0–99)
NONHDL: 156.25
Total CHOL/HDL Ratio: 4
Triglycerides: 124 mg/dL (ref 0.0–149.0)
VLDL: 24.8 mg/dL (ref 0.0–40.0)

## 2017-11-08 LAB — BASIC METABOLIC PANEL
BUN: 27 mg/dL — ABNORMAL HIGH (ref 6–23)
CO2: 30 mEq/L (ref 19–32)
Calcium: 9.8 mg/dL (ref 8.4–10.5)
Chloride: 104 mEq/L (ref 96–112)
Creatinine, Ser: 0.96 mg/dL (ref 0.40–1.20)
GFR: 60.69 mL/min (ref >60.00)
GLUCOSE: 96 mg/dL (ref 70–99)
POTASSIUM: 4.5 meq/L (ref 3.5–5.1)
SODIUM: 141 meq/L (ref 135–145)

## 2017-11-08 LAB — HEPATIC FUNCTION PANEL
ALT: 6 U/L (ref 0–35)
AST: 9 U/L (ref 0–37)
Albumin: 4 g/dL (ref 3.5–5.2)
Alkaline Phosphatase: 84 U/L (ref 39–117)
Bilirubin, Direct: 0.1 mg/dL (ref 0.0–0.3)
Total Bilirubin: 0.3 mg/dL (ref 0.2–1.2)
Total Protein: 7.9 g/dL (ref 6.0–8.3)

## 2017-11-08 LAB — HEMOGLOBIN A1C: Hgb A1c MFr Bld: 6.2 % (ref 4.6–6.5)

## 2017-11-08 MED ORDER — ONETOUCH DELICA LANCETS 33G MISC: 11 refills | Status: DC

## 2017-11-08 MED ORDER — ACCU-CHEK AVIVA PLUS W/DEVICE KIT: PACK | 2 refills | Status: DC

## 2017-11-08 MED ORDER — GLUCOSE BLOOD VI STRP: ORAL_STRIP | 3 refills | Status: DC

## 2017-11-08 NOTE — Progress Notes (Signed)
Subjective:    Patient ID: Alyssa Ingram, female    DOB: [birth year], 72 y.o.   MRN: [id number]  HPI  Here to f/u; overall doing ok,  Pt denies chest pain, increasing sob or doe, wheezing, orthopnea, PND, increased LE swelling, palpitations, dizziness or syncope.  Pt denies new neurological symptoms such as new headache, or facial or extremity weakness or numbness.  Pt denies polydipsia, polyuria, or low sugar episode.  Pt states overall good compliance with meds, mostly trying to follow appropriate diet, with wt overall stable,  but little exercise however.   Pt continues to have recurring LBP without change in severity, bowel or bladder change, fever, wt loss,  worsening LE pain/numbness/weakness,but has fallen x 6 times without injury, she thinks due to knee issues. Past Medical History:  Diagnosis Date  . Abnormal MRI, spine 11/2011   ?discitis L5-S1, s/p CT bx 12/12/11  . Allergic rhinitis, cause unspecified 01/31/2013  . Arthritis   . Asthma   . Cervical cancer ([place])   . Chronic diastolic heart failure ([place])   . Diabetic retinopathy ([place])   . GERD (gastroesophageal reflux disease)   . H/O cardiovascular stress test    Nuclear study in 2011 normal perfusion  . HOCM (hypertrophic obstructive cardiomyopathy) ([place])    Echo 12/19/11: Severe LVH, EF 55-65%, dynamic obstruction, wall motion normal, grade 1 diastolic dysfunction, systolic anterior motion of the mitral valve with mild MS and mild MR, mild LAE, PASP 34  . Hyperlipidemia   . Hypertension   . Kidney stones   . Obesity   . Type II or unspecified type diabetes mellitus without mention of complication, uncontrolled 01/31/2013   Past Surgical History:  Procedure Laterality Date  . ABDOMINAL HYSTERECTOMY    . JOINT REPLACEMENT     right knee 2006  . rotater cuff     right, 2005    reports that she has never smoked. She has never used smokeless tobacco. She reports that she does not drink alcohol or use drugs. family history  includes Alcohol abuse in her other; Arthritis in her other and other; Cancer in her other; Diabetes in her other and other; Heart disease in her father, other, and other; Hypertension in her other and other; Kidney disease in her other; Mental illness in her other; Sudden death in her other. Allergies  Allergen Reactions  . Sulfa Antibiotics Hives  . Metformin And Related   . Other Hives    Unknown drug  . Prednisone    Current Outpatient Medications on File Prior to Visit  Medication Sig Dispense Refill  . albuterol (PROAIR HFA) 108 (90 Base) MCG/ACT inhaler Inhale 2 puffs into the lungs 2 (two) times daily. 8.5 Inhaler 2  . albuterol (VENTOLIN HFA) 108 (90 Base) MCG/ACT inhaler Inhale 2 puffs into the lungs 2 (two) times daily. 18 Inhaler 2  . allopurinol (ZYLOPRIM) 100 MG tablet Take 1 tablet (100 mg total) by mouth daily. 90 tablet 3  . aspirin 81 MG tablet Take 162 mg by mouth daily.     Marland Kitchen atorvastatin (LIPITOR) 40 MG tablet Take 1 tablet (40 mg total) by mouth daily. 90 tablet 3  . cetirizine (ZYRTEC) 10 MG tablet TAKE 1 TABLET (10 MG TOTAL) BY MOUTH DAILY as needed 90 tablet 3  . diltiazem (CARDIZEM CD) 180 MG 24 hr capsule TAKE 1 CAPSULE (180 MG TOTAL) BY MOUTH DAILY. 90 capsule 2  . furosemide (LASIX) 20 MG tablet TAKE 3 TABLETS (60 MG TOTAL)  BY MOUTH 2 TIMES DAILY. 180 tablet 2  . gabapentin (NEURONTIN) 300 MG capsule TAKE 1 TO 2 TABLETS BY MOUTH AT BEDTIME 60 capsule 5  . glipiZIDE (GLUCOTROL XL) 2.5 MG 24 hr tablet TAKE 1 TABLET BY MOUTH DAILY WITH BREAKFAST. 90 tablet 3  . meclizine (ANTIVERT) 25 MG tablet TAKE 1 TABLET (25 MG TOTAL) BY MOUTH 3 (THREE) TIMES DAILY. 60 tablet 2  . meloxicam (MOBIC) 15 MG tablet Take 1 tablet (15 mg total) by mouth daily as needed for pain. 90 tablet 1  . metoprolol tartrate (LOPRESSOR) 50 MG tablet TAKE 1 TABLET BY MOUTH TWICE A DAY 180 tablet 3  . pantoprazole (PROTONIX) 40 MG tablet Take 1 tablet (40 mg total) by mouth daily. 90 tablet 3  .  tiZANidine (ZANAFLEX) 4 MG tablet Take 1 tablet (4 mg total) by mouth every 6 (six) hours as needed for muscle spasms. 50 tablet 1  . triamcinolone (NASACORT AQ) 55 MCG/ACT AERO nasal inhaler Place 2 sprays into the nose daily. 1 Inhaler 12   No current facility-administered medications on file prior to visit.    Review of Systems  Constitutional: Negative for other unusual diaphoresis or sweats HENT: Negative for ear discharge or swelling Eyes: Negative for other worsening visual disturbances Respiratory: Negative for stridor or other swelling  Gastrointestinal: Negative for worsening distension or other blood Genitourinary: Negative for retention or other urinary change Musculoskeletal: Negative for other MSK pain or swelling Skin: Negative for color change or other new lesions Neurological: Negative for worsening tremors and other numbness  Psychiatric/Behavioral: Negative for worsening agitation or other fatigue All other system neg per pt    Objective:   Physical Exam BP 124/84   Pulse (!) 53   Ht 5' (1.524 m)   SpO2 94%   BMI 43.94 kg/m  VS noted,  Constitutional: Pt appears in NAD HENT: Head: NCAT.  Right Ear: External ear normal.  Left Ear: External ear normal.  Eyes: . Pupils are equal, round, and reactive to light. Conjunctivae and EOM are normal Nose: without d/c or deformity Neck: Neck supple. Gross normal ROM Cardiovascular: Normal rate and regular rhythm.   Pulmonary/Chest: Effort normal and breath sounds without rales or wheezing.  Abd:  Soft, NT, ND, + BS, no organomegaly Neurological: Pt is alert. At baseline orientation, motor grossly intact Skin: Skin is warm. No rashes, other new lesions, no LE edema Psychiatric: Pt behavior is normal without agitation  No other exam findings    Assessment & Plan:

## 2017-11-08 NOTE — Assessment & Plan Note (Signed)
stable overall by history and exam, recent data reviewed with pt, and pt to continue medical treatment as before,  to f/u any worsening symptoms or concerns Lab Results  Component Value Date   HGBA1C 6.2 11/08/2017

## 2017-11-08 NOTE — Telephone Encounter (Signed)
Pt was seen today by Dr Smith.

## 2017-11-08 NOTE — Progress Notes (Signed)
[name] [place],  [address] Phone: [phone] Subjective:     CC: Knee pain, arm pain  TFT:[id number]  Alyssa Ingram is a 72 y.o. female coming in with complaint of right arm pain and left knee pain.  Known to have significant arthritic changes of the knees bilaterally right and left.  States that it is very uncomfortable.  Started given her severe pain.  Last time was nearly 5 months ago and was given an injection in the left knee.  Noticing more swelling in the legs and has seen primary care provider.  Right arm pain as well.  Was having elbow pain previously.  Seems to radiate more towards the shoulder now.  Wakes her up at night.  Patient has to use her arm to help her with transferring with her not walking so much at this time.  Does not remember any true injury.     Past Medical History:  Diagnosis Date  . Abnormal MRI, spine 11/2011   ?discitis L5-S1, s/p CT bx 12/12/11  . Allergic rhinitis, cause unspecified 01/31/2013  . Arthritis   . Asthma   . Cervical cancer ([place])   . Chronic diastolic heart failure ([place])   . Diabetic retinopathy ([place])   . GERD (gastroesophageal reflux disease)   . H/O cardiovascular stress test    Nuclear study in 2011 normal perfusion  . HOCM (hypertrophic obstructive cardiomyopathy) ([place])    Echo 12/19/11: Severe LVH, EF 55-65%, dynamic obstruction, wall motion normal, grade 1 diastolic dysfunction, systolic anterior motion of the mitral valve with mild MS and mild MR, mild LAE, PASP 34  . Hyperlipidemia   . Hypertension   . Kidney stones   . Obesity   . Type II or unspecified type diabetes mellitus without mention of complication, uncontrolled 01/31/2013   Past Surgical History:  Procedure Laterality Date  . ABDOMINAL HYSTERECTOMY    . JOINT REPLACEMENT     right knee 2006  . rotater cuff     right, 2005   Social History   Socioeconomic History  . Marital status: Widowed    Spouse  name: Not on file  . Number of children: 5  . Years of education: 29  . Highest education level: Not on file  Occupational History  . Occupation: Retired Mining engineer  . Financial resource strain: Not on file  . Food insecurity:    Worry: Not on file    Inability: Not on file  . Transportation needs:    Medical: Not on file    Non-medical: Not on file  Tobacco Use  . Smoking status: Never Smoker  . Smokeless tobacco: Never Used  Substance and Sexual Activity  . Alcohol use: No    Alcohol/week: 0.0 oz  . Drug use: No  . Sexual activity: Not on file  Lifestyle  . Physical activity:    Days per week: Not on file    Minutes per session: Not on file  . Stress: Not on file  Relationships  . Social connections:    Talks on phone: Not on file    Gets together: Not on file    Attends religious service: Not on file    Active member of club or organization: Not on file    Attends meetings of clubs or organizations: Not on file    Relationship status: Not on file  Other Topics Concern  . Not on file  Social History Narrative  .  Not on file   Allergies  Allergen Reactions  . Sulfa Antibiotics Hives  . Metformin And Related   . Other Hives    Unknown drug  . Prednisone    Family History  Problem Relation Age of Onset  . Heart disease Father   . Arthritis Other   . Heart disease Other   . Hypertension Other   . Diabetes Other   . Alcohol abuse Other   . Arthritis Other   . Cancer Other        Lung Cancer  . Heart disease Other   . Hypertension Other   . Sudden death Other   . Kidney disease Other   . Mental illness Other   . Diabetes Other   . Colon cancer Neg Hx      Past medical history, social, surgical and family history all reviewed in electronic medical record.  No pertanent information unless stated regarding to the chief complaint.   Review of Systems:Review of systems updated and as accurate as of 11/08/17  No headache, visual changes,  nausea, vomiting, diarrhea, constipation, dizziness, abdominal pain, skin rash, fevers, chills, night sweats, weight loss, swollen lymph nodes,chest pain, shortness of breath, mood changes.  Positive muscle aches, body aches, joint swelling  Objective  Blood pressure 124/84, pulse (!) 53, height 5' (1.524 m), SpO2 94 %. Systems examined below as of 11/08/17   General: No apparent distress alert and oriented x3 mood and affect normal, dressed appropriately.  Sitting in a wheelchair HEENT: Pupils equal, extraocular movements intact  Respiratory: Patient's speak in full sentences and does not appear short of breath  Cardiovascular: 4+lower extremity edema with severe hemosiderin deposits, tender, no erythema  Abdomen: Soft nontender distended Neuro: Cranial nerves II through XII are intact, neurovascularly intact in all extremities with 2+ DTRs and 2+ pulses.  Lymph: No lymphadenopathy of posterior or anterior cervical chain or axillae bilaterally.  Gait patient is in a wheelchair MSK:  Non tender with full range of motion and good stability and symmetric strength and tone of  elbows, wrist, hip, and ankles bilaterally.  Right shoulder exam full range of motion with positive impingement.  4 out of 5 strength compared to contralateral side.  Positive crossover test. Knee: Left valgus deformity noted. Large thigh to calf ratio.  Tender to palpation over medial and PF joint line.  Limited range of motion in all planes lacking last 10 degrees of extension and only flexion of 90 degrees instability with valgus force.  painful patellar compression. Patellar glide with moderate crepitus. Patellar and quadriceps tendons unremarkable. Hamstring and quadriceps strength is normal. Contralateral knee shows replacement  After informed written and verbal consent, patient was seated on exam table. Right shoulder was prepped with alcohol swab and utilizing posterior approach, patient's right glenohumeral  space was injected with 4:1  marcaine 0.5%: Kenalog 40mg /dL. Patient tolerated the procedure well without immediate complications.   After informed written and verbal consent, patient was seated on exam table. Left knee was prepped with alcohol swab and utilizing anterolateral approach, patient's left knee space was injected with 4:1  marcaine 0.5%: Kenalog 40mg /dL. Patient tolerated the procedure well without immediate complications.    Impression and Recommendations:     This case required medical decision making of moderate complexity.      Note: This dictation was prepared with Dragon dictation along with smaller phrase technology. Any transcriptional errors that result from this process are unintentional.

## 2017-11-08 NOTE — Assessment & Plan Note (Signed)
stable overall by history and exam, recent data reviewed with pt, and pt to continue medical treatment as before,  to f/u any worsening symptoms or concerns BP Readings from Last 3 Encounters:  11/08/17 124/84  11/08/17 124/84  06/20/17 (!) 146/86

## 2017-11-08 NOTE — Assessment & Plan Note (Signed)
Severe.  Following up with primary care provider

## 2017-11-08 NOTE — Assessment & Plan Note (Signed)
Patient given injection.  Tolerated procedure well.  Discussed icing regimen.  Patient does have significant arthritic changes and instability.  Chronic venous insufficiency units likely contributing to the pain as well.  Given injection discussed the possibility of Visco supplementation.  Otherwise follow-up in 3 to 4 months

## 2017-11-08 NOTE — Assessment & Plan Note (Signed)
stable overall by history and exam, recent data reviewed with pt, and pt to continue medical treatment as before,  to f/u any worsening symptoms or concerns, for f/u lab today 

## 2017-11-08 NOTE — Patient Instructions (Addendum)
Good to see you  Injected the left knee and the shoulder  Stay active as you can  Ice is your friend.  I can repeat every 3 months  See me again in 3-4 months

## 2017-11-08 NOTE — Patient Instructions (Signed)

## 2017-11-08 NOTE — Assessment & Plan Note (Signed)
Patient given injection and tolerated procedure well.  We discussed icing regimen.  Discussed avoiding certain activities.  Discussed proper transferring out of the wheelchair.  Follow-up again in 3 months

## 2017-11-09 ENCOUNTER — Telehealth: Payer: Self-pay | Admitting: Internal Medicine

## 2017-11-09 MED ORDER — FREESTYLE LIBRE 14 DAY SENSOR MISC: 1.0000 | 3 refills | Status: DC

## 2017-11-09 MED ORDER — FREESTYLE LIBRE READER DEVI: 1.0000 | Freq: Four times a day (QID) | 0 refills | Status: DC | PRN

## 2017-11-09 NOTE — Addendum Note (Signed)
Addended by: [name] on: 11/09/2017 01:03 PM   Modules accepted: Orders

## 2017-11-09 NOTE — Telephone Encounter (Signed)
Copied from [place] [id number]. Topic: Quick Communication - See Telephone Encounter >> Nov 09, 2017 11:47 AM Antonieta Iba C wrote: CRM for notification. See Telephone encounter for: 11/09/17.   Pt says that she changed her mind she would like to have the patch for her diabetes instead of using the needles. Pt would like to have a Rx sent in to pharmacy  Pharmacy: [id number] [address], [place] [phone] (Phone) [phone] (Fax)

## 2017-11-09 NOTE — Telephone Encounter (Signed)
Pt actually means the "sensor"  - not the "patch"  [place] for [employer] and supplies - done erx

## 2017-11-12 ENCOUNTER — Other Ambulatory Visit: Payer: Self-pay

## 2017-11-12 MED ORDER — ONETOUCH DELICA PLUS LANCETS MISC: 1.0000 | Freq: Every day | 3 refills | Status: DC

## 2017-12-03 ENCOUNTER — Ambulatory Visit: Payer: Medicare HMO | Admitting: Orthotics

## 2017-12-04 ENCOUNTER — Ambulatory Visit: Payer: Medicare HMO | Admitting: Orthotics

## 2017-12-11 ENCOUNTER — Ambulatory Visit: Payer: Medicare HMO | Admitting: Orthotics

## 2017-12-11 DIAGNOSIS — M2142 Flat foot [pes planus] (acquired), left foot: Principal | ICD-10-CM

## 2017-12-11 DIAGNOSIS — E1149 Type 2 diabetes mellitus with other diabetic neurological complication: Secondary | ICD-10-CM

## 2017-12-11 DIAGNOSIS — M2141 Flat foot [pes planus] (acquired), right foot: Secondary | ICD-10-CM

## 2017-12-11 NOTE — Progress Notes (Signed)
Need to send back to [facility]...too big in toe box.

## 2017-12-18 ENCOUNTER — Telehealth: Payer: Self-pay | Admitting: Emergency Medicine

## 2017-12-18 NOTE — Telephone Encounter (Signed)
Called patient to schedule AWV. Pt will call back to schedule at a later date. Pt can make appt at anytime.

## 2018-01-06 ENCOUNTER — Other Ambulatory Visit: Payer: Self-pay | Admitting: Internal Medicine

## 2018-01-10 ENCOUNTER — Encounter: Payer: Self-pay | Admitting: Podiatry

## 2018-01-10 ENCOUNTER — Ambulatory Visit: Payer: Medicare HMO | Admitting: Podiatry

## 2018-01-10 DIAGNOSIS — E1149 Type 2 diabetes mellitus with other diabetic neurological complication: Secondary | ICD-10-CM

## 2018-01-10 DIAGNOSIS — B351 Tinea unguium: Secondary | ICD-10-CM | POA: Diagnosis not present

## 2018-01-10 DIAGNOSIS — M79674 Pain in right toe(s): Secondary | ICD-10-CM

## 2018-01-10 DIAGNOSIS — M79675 Pain in left toe(s): Secondary | ICD-10-CM | POA: Diagnosis not present

## 2018-01-10 MED ORDER — AMMONIUM LACTATE 12 % EX CREA: TOPICAL_CREAM | CUTANEOUS | 0 refills | Status: DC | PRN

## 2018-01-10 NOTE — Progress Notes (Signed)
Subjective: 72 y.o. returns the office today for painful, elongated, thickened toenails which she cannot trim herself.  She is also think this will be due to put on her dry skin for her feet and legs.  Denies any open sores or drainage.  She is wearing custom shoes but she did get some temporary shoes from [facility] has been helpful.  She is on gabapentin for neuropathy.  She has no other concerns today.  PCP: [name], MD  Objective: AAO 3, NAD DP/PT pulses palpable, CRT less than 3 seconds Chronic bilateral lower extremity edema.  Venous insufficiency changes are present. Nails hypertrophic, dystrophic, elongated, brittle, discolored 10. There is tenderness overlying the nails 1-5 bilaterally. There is no surrounding erythema or drainage along the nail sites. Dry skin to the heels.  No skin fissures. No open lesions or pre-ulcerative lesions are identified. No other areas of tenderness bilateral lower extremities. No overlying edema, erythema, increased warmth. No pain with calf compression, warmth, erythema.  Assessment: Patient presents with symptomatic onychomycosis  Plan: -Treatment options including alternatives, risks, complications were discussed -Nails sharply debrided 10 without complication/bleeding. -Continue gabapentin for neuropathy. -Discussed daily foot inspection. If there are any changes, to call the office immediately.  -Follow-up in 3 months or sooner if any problems are to arise. In the meantime, encouraged to call the office with any questions, concerns, changes symptoms.  Celesta Gentile, DPM

## 2018-01-23 ENCOUNTER — Telehealth: Payer: Self-pay | Admitting: Internal Medicine

## 2018-01-23 NOTE — Telephone Encounter (Signed)
Copied from [place] [id number]. Topic: Quick Communication - See Telephone Encounter >> Jan 23, 2018  4:41 PM Neva Seat wrote: Pt needing an appt w/ Dr. Tamala Julian before Sept 30, which was his 1st available for her knees.   Pt is also needing an appt w/ Dr. Jenny Reichmann for her throat and back on the same day due to her transportation issues. Please call pt back to schedule.

## 2018-01-28 NOTE — Telephone Encounter (Signed)
Patient scheduled 03/01/18 @ 1045am.

## 2018-01-29 NOTE — Progress Notes (Signed)
[name] [place], [place] [address] Phone: [phone] Subjective:    I Alyssa Ingram am serving as a Education administrator for Dr. Hulan Saas.   I'm seeing this patient by the request  of:  [name], MD   CC: Left knee left hip pain  LDJ:[id number]  Alyssa Ingram is a 72 y.o. female coming in with complaint of left knee pain.  Severe arthritic changes of the knee.  Has been injected every 3 months.  Patient is mostly wheelchair-bound at this time.  Multiple other comorbidities.  Continues to have severe chronic venous insufficiency.  Congestive heart failure is also concerned.  Patient states that even walking short distances become more difficult recently.  Rates the severity of pain is 9 out of 10.  Pain on the lateral aspect of the left hip.  Worse with laying down.  States that after couple steps pain seems to resolve a little bit but then after sitting again seems to worsen again.  Has had back pain and left lumbar radiculopathy previously but states that this seems more localized.     Past Medical History:  Diagnosis Date  . Abnormal MRI, spine 11/2011   ?discitis L5-S1, s/p CT bx 12/12/11  . Allergic rhinitis, cause unspecified 01/31/2013  . Arthritis   . Asthma   . Cervical cancer ([place])   . Chronic diastolic heart failure ([place])   . Diabetic retinopathy ([place])   . GERD (gastroesophageal reflux disease)   . H/O cardiovascular stress test    Nuclear study in 2011 normal perfusion  . HOCM (hypertrophic obstructive cardiomyopathy) ([place])    Echo 12/19/11: Severe LVH, EF 55-65%, dynamic obstruction, wall motion normal, grade 1 diastolic dysfunction, systolic anterior motion of the mitral valve with mild MS and mild MR, mild LAE, PASP 34  . Hyperlipidemia   . Hypertension   . Kidney stones   . Obesity   . Type II or unspecified type diabetes mellitus without mention of complication, uncontrolled 01/31/2013   Past Surgical History:    Procedure Laterality Date  . ABDOMINAL HYSTERECTOMY    . JOINT REPLACEMENT     right knee 2006  . rotater cuff     right, 2005   Social History   Socioeconomic History  . Marital status: Widowed    Spouse name: Not on file  . Number of children: 5  . Years of education: 79  . Highest education level: Not on file  Occupational History  . Occupation: Retired Mining engineer  . Financial resource strain: Not on file  . Food insecurity:    Worry: Not on file    Inability: Not on file  . Transportation needs:    Medical: Not on file    Non-medical: Not on file  Tobacco Use  . Smoking status: Never Smoker  . Smokeless tobacco: Never Used  Substance and Sexual Activity  . Alcohol use: No    Alcohol/week: 0.0 standard drinks  . Drug use: No  . Sexual activity: Not on file  Lifestyle  . Physical activity:    Days per week: Not on file    Minutes per session: Not on file  . Stress: Not on file  Relationships  . Social connections:    Talks on phone: Not on file    Gets together: Not on file    Attends religious service: Not on file    Active member of club or organization: Not on file  Attends meetings of clubs or organizations: Not on file    Relationship status: Not on file  Other Topics Concern  . Not on file  Social History Narrative  . Not on file   Allergies  Allergen Reactions  . Sulfa Antibiotics Hives  . Metformin And Related   . Other Hives    Unknown drug  . Prednisone    Family History  Problem Relation Age of Onset  . Heart disease Father   . Arthritis Other   . Heart disease Other   . Hypertension Other   . Diabetes Other   . Alcohol abuse Other   . Arthritis Other   . Cancer Other        Lung Cancer  . Heart disease Other   . Hypertension Other   . Sudden death Other   . Kidney disease Other   . Mental illness Other   . Diabetes Other   . Colon cancer Neg Hx     Current Outpatient Medications (Endocrine & Metabolic):  .   glipiZIDE (GLUCOTROL XL) 2.5 MG 24 hr tablet, TAKE 1 TABLET BY MOUTH DAILY WITH BREAKFAST.  Current Outpatient Medications (Cardiovascular):  .  atorvastatin (LIPITOR) 40 MG tablet, Take 1 tablet (40 mg total) by mouth daily. Marland Kitchen  diltiazem (CARDIZEM CD) 180 MG 24 hr capsule, TAKE 1 CAPSULE (180 MG TOTAL) BY MOUTH DAILY. .  furosemide (LASIX) 20 MG tablet, TAKE 3 TABLETS (60 MG TOTAL) BY MOUTH 2 TIMES DAILY. .  metoprolol tartrate (LOPRESSOR) 50 MG tablet, TAKE 1 TABLET BY MOUTH TWICE A DAY  Current Outpatient Medications (Respiratory):  .  albuterol (PROAIR HFA) 108 (90 Base) MCG/ACT inhaler, Inhale 2 puffs into the lungs 2 (two) times daily. Marland Kitchen  albuterol (VENTOLIN HFA) 108 (90 Base) MCG/ACT inhaler, Inhale 2 puffs into the lungs 2 (two) times daily. .  cetirizine (ZYRTEC) 10 MG tablet, TAKE 1 TABLET (10 MG TOTAL) BY MOUTH DAILY as needed .  triamcinolone (NASACORT AQ) 55 MCG/ACT AERO nasal inhaler, Place 2 sprays into the nose daily.  Current Outpatient Medications (Analgesics):  .  allopurinol (ZYLOPRIM) 100 MG tablet, Take 1 tablet (100 mg total) by mouth daily. Marland Kitchen  aspirin 81 MG tablet, Take 162 mg by mouth daily.  .  meloxicam (MOBIC) 15 MG tablet, TAKE 1 TABLET (15 MG TOTAL) BY MOUTH DAILY AS NEEDED FOR PAIN. .  traMADol (ULTRAM) 50 MG tablet, Take 1 tablet (50 mg total) by mouth every 6 (six) hours as needed.   Current Outpatient Medications (Other):  .  ammonium lactate (AMLACTIN) 12 % cream, Apply topically as needed for dry skin. .  Blood Glucose Monitoring Suppl (ACCU-CHEK AVIVA PLUS) w/Device KIT, Use to check blood sugar three times daily .  Continuous Blood Gluc Receiver (FREESTYLE LIBRE READER) DEVI, Apply 1 Device topically 4 (four) times daily as needed. E11.9 .  Continuous Blood Gluc Sensor (FREESTYLE LIBRE 14 DAY SENSOR) MISC, Apply 1 Device topically every 14 (fourteen) days. E11.9 .  gabapentin (NEURONTIN) 300 MG capsule, TAKE 1 TO 2 TABLETS BY MOUTH AT BEDTIME .   glucose blood (ACCU-CHEK AVIVA PLUS) test strip, Use to check blood sugar three times daily .  lidocaine (LIDODERM) 5 %, Place 1 patch onto the skin daily. Remove & Discard patch within 12 hours or as directed by MD .  meclizine (ANTIVERT) 25 MG tablet, TAKE 1 TABLET (25 MG TOTAL) BY MOUTH 3 (THREE) TIMES DAILY. Marland Kitchen  ONETOUCH DELICA PLUS LANCETS MISC, Apply 1 Device topically  daily. Use to check lood sugar once daily E11.9 .  pantoprazole (PROTONIX) 40 MG tablet, Take 1 tablet (40 mg total) by mouth daily. Marland Kitchen  tiZANidine (ZANAFLEX) 2 MG tablet, Take 1 tablet (2 mg total) by mouth every 6 (six) hours as needed for muscle spasms.    Past medical history, social, surgical and family history all reviewed in electronic medical record.  No pertanent information unless stated regarding to the chief complaint.   Review of Systems:  No headache, visual changes, nausea, vomiting, diarrhea, constipation, dizziness, abdominal pain, skin rash, fevers, chills, night sweats, weight loss, swollen lymph nodes,  chest pain, shortness of breath, mood changes.  Positive muscle aches, body aches, joint swelling and lower extremity swelling  Objective  Blood pressure 124/86, pulse 69, height 5' (1.524 m), weight 225 lb (102.1 kg).    General: No apparent distress alert and oriented x3 mood and affect normal, dressed appropriately.  Patient is in a wheelchair. HEENT: Pupils equal, extraocular movements intact  Respiratory: Patient's speak in full sentences and does not appear short of breath  Cardiovascular: 4+ lower extremity edema, tender with hemosiderin deposits Skin: Warm dry intact with no signs of infection or rash on extremities or on axial skeleton.  Abdomen: Soft nontender mildly distended Neuro: Cranial nerves II through XII are intact, neurovascularly intact in all extremities with 2+ DTRs and 2+ pulses.  Lymph: No lymphadenopathy of posterior or anterior cervical chain or axillae bilaterally.  Gait  wheelchair bound but patient was able to stand MSK:  tender with limited range of motion but good stability and symmetric strength and tone of shoulders, elbows, wrist,  Right knee has replacement of the left knee has significant crepitus with range of motion.  Lacks last 15 degrees of extension and only flexion of 95 degrees.  Severe tenderness diffusely.  Left hip shows tenderness over the greater trochanteric area.  Unable to do significant amount of testing secondary to patient's body habitus and pain  After informed written and verbal consent, patient was seated on exam table. Right knee was prepped with alcohol swab and utilizing anterolateral approach, patient's right knee space was injected with 4:1  marcaine 0.5%: Kenalog 61m/dL. Patient tolerated the procedure well without immediate complications.  After informed written and verbal consent patient was prepped with alcohol swab.  With a 21-gauge 2 inch needle patient was injected in the left greater trochanteric area with a total of 2 cc of 0.5% Marcaine and 1 cc of Kenalog 40 mg/mL no blood loss.  Postinjection instructions given.    Impression and Recommendations:     This case required medical decision making of moderate complexity. The above documentation has been reviewed and is accurate and complete ZLyndal Pulley DO       Note: This dictation was prepared with Dragon dictation along with smaller phrase technology. Any transcriptional errors that result from this process are unintentional.

## 2018-01-30 ENCOUNTER — Encounter: Payer: Self-pay | Admitting: Internal Medicine

## 2018-01-30 ENCOUNTER — Encounter: Payer: Self-pay | Admitting: Family Medicine

## 2018-01-30 ENCOUNTER — Ambulatory Visit: Payer: Medicare HMO | Admitting: Family Medicine

## 2018-01-30 ENCOUNTER — Encounter

## 2018-01-30 ENCOUNTER — Ambulatory Visit (INDEPENDENT_AMBULATORY_CARE_PROVIDER_SITE_OTHER): Payer: Medicare HMO | Admitting: Internal Medicine

## 2018-01-30 ENCOUNTER — Ambulatory Visit: Payer: Medicare HMO | Admitting: Internal Medicine

## 2018-01-30 VITALS — BP 124/86 | HR 69 | Temp 97.7°F | Ht 60.0 in

## 2018-01-30 DIAGNOSIS — M7062 Trochanteric bursitis, left hip: Secondary | ICD-10-CM | POA: Insufficient documentation

## 2018-01-30 DIAGNOSIS — E119 Type 2 diabetes mellitus without complications: Secondary | ICD-10-CM | POA: Diagnosis not present

## 2018-01-30 DIAGNOSIS — J309 Allergic rhinitis, unspecified: Secondary | ICD-10-CM | POA: Diagnosis not present

## 2018-01-30 DIAGNOSIS — M545 Low back pain: Secondary | ICD-10-CM

## 2018-01-30 DIAGNOSIS — M1712 Unilateral primary osteoarthritis, left knee: Secondary | ICD-10-CM

## 2018-01-30 DIAGNOSIS — I872 Venous insufficiency (chronic) (peripheral): Secondary | ICD-10-CM | POA: Diagnosis not present

## 2018-01-30 DIAGNOSIS — Z23 Encounter for immunization: Secondary | ICD-10-CM

## 2018-01-30 MED ORDER — TRAMADOL HCL 50 MG PO TABS: 50.0000 mg | ORAL_TABLET | Freq: Four times a day (QID) | ORAL | 0 refills | Status: DC | PRN

## 2018-01-30 MED ORDER — CETIRIZINE HCL 10 MG PO TABS: ORAL_TABLET | ORAL | 3 refills | Status: DC

## 2018-01-30 MED ORDER — FREESTYLE LIBRE 14 DAY SENSOR MISC: 1.0000 | 3 refills | Status: DC

## 2018-01-30 MED ORDER — FREESTYLE LIBRE READER DEVI: 1.0000 | Freq: Four times a day (QID) | 0 refills | Status: DC | PRN

## 2018-01-30 MED ORDER — LIDOCAINE 5 % EX PTCH: 1.0000 | MEDICATED_PATCH | CUTANEOUS | 1 refills | Status: DC

## 2018-01-30 MED ORDER — TIZANIDINE HCL 2 MG PO TABS: 2.0000 mg | ORAL_TABLET | Freq: Four times a day (QID) | ORAL | 1 refills | Status: DC | PRN

## 2018-01-30 NOTE — Progress Notes (Signed)
Subjective:    Patient ID: Alyssa Ingram, female    DOB: November 22, 1945, 72 y.o.   MRN: [id number]  HPI  Here to f/u with c/o LBP, chronic with acute worsening in the past week, constant, sharp and dull, worse to lie down for more than an hour, has been better with lying on her stomach with legs hanging off the side of the bed to sleep that way; nothing else makes better or worse.  Pt denies bowel or bladder change, fever, wt loss,  worsening LE pain/numbness/weakness, or falls.  Also Does have several wks ongoing nasal allergy symptoms with clearish congestion, itch and sneezing, without fever, pain, ST, cough, swelling or wheezing.  Pt denies new neurological symptoms such as new headache, or facial or extremity weakness or numbness   Pt denies polydipsia, polyuria, but asks again for [facility] glucometer device and sensor.  Plans also to see Dr Tamala Julian later today for joint pain Past Medical History:  Diagnosis Date  . Abnormal MRI, spine 11/2011   ?discitis L5-S1, s/p CT bx 12/12/11  . Allergic rhinitis, cause unspecified 01/31/2013  . Arthritis   . Asthma   . Cervical cancer ([place])   . Chronic diastolic heart failure ([place])   . Diabetic retinopathy ([place])   . GERD (gastroesophageal reflux disease)   . H/O cardiovascular stress test    Nuclear study in 2011 normal perfusion  . HOCM (hypertrophic obstructive cardiomyopathy) ([place])    Echo 12/19/11: Severe LVH, EF 55-65%, dynamic obstruction, wall motion normal, grade 1 diastolic dysfunction, systolic anterior motion of the mitral valve with mild MS and mild MR, mild LAE, PASP 34  . Hyperlipidemia   . Hypertension   . Kidney stones   . Obesity   . Type II or unspecified type diabetes mellitus without mention of complication, uncontrolled 01/31/2013   Past Surgical History:  Procedure Laterality Date  . ABDOMINAL HYSTERECTOMY    . JOINT REPLACEMENT     right knee 2006  . rotater cuff     right, 2005    reports that she has never smoked. She has  never used smokeless tobacco. She reports that she does not drink alcohol or use drugs. family history includes Alcohol abuse in her other; Arthritis in her other and other; Cancer in her other; Diabetes in her other and other; Heart disease in her father, other, and other; Hypertension in her other and other; Kidney disease in her other; Mental illness in her other; Sudden death in her other. Allergies  Allergen Reactions  . Sulfa Antibiotics Hives  . Metformin And Related   . Other Hives    Unknown drug  . Prednisone    Current Outpatient Medications on File Prior to Visit  Medication Sig Dispense Refill  . albuterol (PROAIR HFA) 108 (90 Base) MCG/ACT inhaler Inhale 2 puffs into the lungs 2 (two) times daily. 8.5 Inhaler 2  . albuterol (VENTOLIN HFA) 108 (90 Base) MCG/ACT inhaler Inhale 2 puffs into the lungs 2 (two) times daily. 18 Inhaler 2  . allopurinol (ZYLOPRIM) 100 MG tablet Take 1 tablet (100 mg total) by mouth daily. 90 tablet 3  . ammonium lactate (AMLACTIN) 12 % cream Apply topically as needed for dry skin. 385 g 0  . aspirin 81 MG tablet Take 162 mg by mouth daily.     Marland Kitchen atorvastatin (LIPITOR) 40 MG tablet Take 1 tablet (40 mg total) by mouth daily. 90 tablet 3  . Blood Glucose Monitoring Suppl (ACCU-CHEK AVIVA PLUS) w/Device  KIT Use to check blood sugar three times daily 1 kit 2  . diltiazem (CARDIZEM CD) 180 MG 24 hr capsule TAKE 1 CAPSULE (180 MG TOTAL) BY MOUTH DAILY. 90 capsule 2  . furosemide (LASIX) 20 MG tablet TAKE 3 TABLETS (60 MG TOTAL) BY MOUTH 2 TIMES DAILY. 180 tablet 2  . gabapentin (NEURONTIN) 300 MG capsule TAKE 1 TO 2 TABLETS BY MOUTH AT BEDTIME 60 capsule 5  . glipiZIDE (GLUCOTROL XL) 2.5 MG 24 hr tablet TAKE 1 TABLET BY MOUTH DAILY WITH BREAKFAST. 90 tablet 3  . glucose blood (ACCU-CHEK AVIVA PLUS) test strip Use to check blood sugar three times daily 300 each 3  . meclizine (ANTIVERT) 25 MG tablet TAKE 1 TABLET (25 MG TOTAL) BY MOUTH 3 (THREE) TIMES DAILY.  60 tablet 2  . meloxicam (MOBIC) 15 MG tablet TAKE 1 TABLET (15 MG TOTAL) BY MOUTH DAILY AS NEEDED FOR PAIN. 90 tablet 1  . metoprolol tartrate (LOPRESSOR) 50 MG tablet TAKE 1 TABLET BY MOUTH TWICE A DAY 180 tablet 3  . ONETOUCH DELICA PLUS LANCETS MISC Apply 1 Device topically daily. Use to check lood sugar once daily E11.9 100 each 3  . pantoprazole (PROTONIX) 40 MG tablet Take 1 tablet (40 mg total) by mouth daily. 90 tablet 3  . triamcinolone (NASACORT AQ) 55 MCG/ACT AERO nasal inhaler Place 2 sprays into the nose daily. 1 Inhaler 12   No current facility-administered medications on file prior to visit.    Review of Systems  Constitutional: Negative for other unusual diaphoresis or sweats HENT: Negative for ear discharge or swelling Eyes: Negative for other worsening visual disturbances Respiratory: Negative for stridor or other swelling  Gastrointestinal: Negative for worsening distension or other blood Genitourinary: Negative for retention or other urinary change Musculoskeletal: Negative for other MSK pain or swelling Skin: Negative for color change or other new lesions Neurological: Negative for worsening tremors and other numbness  Psychiatric/Behavioral: Negative for worsening agitation or other fatigue ALl other system neg per pt    Objective:   Physical Exam BP 124/86   Pulse 69   Temp 97.7 F (36.5 C) (Oral)   Ht 5' (1.524 m)   SpO2 93%   BMI 43.94 kg/m  VS noted,  Constitutional: Pt appears in NAD HENT: Head: NCAT.  Right Ear: External ear normal.  Left Ear: External ear normal.  Eyes: . Pupils are equal, round, and reactive to light. Conjunctivae and EOM are normal Nose: without d/c or deformity Neck: Neck supple. Gross normal ROM Cardiovascular: Normal rate and regular rhythm.   Pulmonary/Chest: Effort normal and breath sounds without rales or wheezing.  Abd:  Soft, NT, ND, + BS, no organomegaly Neurological: Pt is alert. At baseline orientation, motor  grossly intact Skin: Skin is warm. No rashes, other new lesions, no LE edema Psychiatric: Pt behavior is normal without agitation  No other exam findings Lab Results  Component Value Date   WBC 5.2 06/20/2017   HGB 11.7 (L) 06/20/2017   HCT 36.2 06/20/2017   PLT 238.0 06/20/2017   GLUCOSE 96 11/08/2017   CHOL 202 (H) 11/08/2017   TRIG 124.0 11/08/2017   HDL 46.10 11/08/2017   LDLCALC 131 (H) 11/08/2017   ALT 6 11/08/2017   AST 9 11/08/2017   NA 141 11/08/2017   K 4.5 11/08/2017   CL 104 11/08/2017   CREATININE 0.96 11/08/2017   BUN 27 (H) 11/08/2017   CO2 30 11/08/2017   TSH 0.52 06/20/2017   INR  1.03 12/12/2011   HGBA1C 6.2 11/08/2017   MICROALBUR 4.5 (H) 06/20/2017   MRI LUMBAR SPINE WITHOUT CONTRAST  Technique:  Multiplanar and multiecho pulse sequences of the lumbar spine were obtained without intravenous contrast.  Comparison: None.  Findings: Lumbar segmentation appears to be normal.  There is grade 1 spondylolisthesis at L4-L5 and L5-S1.  Anterolisthesis of L4 on L5 measures 6 mm.  Anterolisthesis of L5-1 S1 measures 3-4 mm.  Bone marrow signal is diffusely heterogeneous throughout the visible spine and pelvis.  There is mild marrow edema at the L5-S1 endplates.  There is severe loss of the L5-S1 disc with height T2 and STIR signal within the disc space.  There is L5 and presacral mild inflammatory STIR signal.  There is heterogeneous STIR signal dorsal to the S1 level and disc space.  No discrete epidural collection.  No paraspinal muscle inflammation is identified.  No other marrow edema or acute osseous abnormality.  Normal vertebral height and alignment above L4-L5.   Visualized lower thoracic spinal cord is normal with conus medularis at L2-L3.  Left renal lower pole probable simple cyst. Otherwise negative visualized abdominal viscera.  T9-T10:  Posterior small disc protrusion.  No significant stenosis.  T10-T11:  Mild facet hypertrophy and  bilateral T10 foraminal stenosis.  T11-T12:  Moderate facet hypertrophy.  Mild right T11 foraminal stenosis.  T12-L1:  Facet and ligament flavum hypertrophy.  No significant stenosis.  L1-L2:  Negative.  L2-L3:  Mild disc bulge.  Moderate facet and ligament flavum hypertrophy.  No significant spinal stenosis.  Moderate bilateral L2 foraminal stenosis.  L3-L4:  Mild disc bulge.  Moderate facet and ligament flavum hypertrophy.  Some epidural lipomatosis is superimposed.  There is mild to moderate spinal stenosis and bilateral L3 foraminal stenosis.  L4-L5:  Spondylolisthesis.  Severe facet and moderate ligament flavum hypertrophy.  Circumferential disc bulge, posterior broad- based disc/pseudo disc protrusion.  Overall moderate spinal stenosis.  Bilateral lateral recess stenosis.  Moderate to severe bilateral L4 foraminal stenosis.  L5-S1:  Spondylolisthesis.  Severe loss of the disc space with circumferential disc osteophyte complex.  Severe facet hypertrophy. Epidural lipomatosis.  Overall moderate spinal stenosis.  No definite lateral recess stenosis.  Moderate bilateral L5 foraminal stenosis.  IMPRESSION: 1.  Signal changes around the L5-S1 disc space suspicious for early infectious diskitis osteomyelitis.  Correlation with hematologic markers of inflammation/infection is recommended.  It is possible that these changes represent severe sterile reaction to the advanced L5-S1 disc degeneration. If the clinical course is equivocal, repeat short interval repeat lumbar MRI (e.g. 2 weeks) may be valuable.  There is also chronic spondylolisthesis and facet arthropathy at this level. 2.  Abnormal bone marrow signal is nonspecific and could reflect benign or malignant etiology (myelodysplasia, other chronic disease, less likely multiple myeloma). 3.  Spondylolisthesis at L4-L5 also associated with disc and facet degeneration.  Subsequent moderate spinal stenosis with  bilateral lateral recess stenosis and moderate to severe bilateral L4 foraminal stenosis. 4.  Multifactorial L3-L4 mild to moderate spinal and bilateral L3 foraminal stenosis.  Salient findings on this study discussed by telephone with Dr. Cathlean Cower on 12/05/2011 at 1135 hours.  Original Report Authenticated By: Randall An, M.D.    Assessment & Plan:

## 2018-01-30 NOTE — Patient Instructions (Addendum)
You had the flu shot today  Please take all new medication as prescribed - the pain pill medication as needed, the pain patch as needed, and the muscle relaxer as needed  Please continue all other medications as before, and refills have been done if requested - the Zyrtec, and the LIBRE glucometer and sensor  Please have the pharmacy call with any other refills you may need.  Please keep your appointments with your specialists as you may have planned  You will be contacted regarding the referral for: Dr Nelva Bush for back pain  You will be contacted regarding the referral for: Physical Therapy  Please see also Dr Tamala Julian today as you have planned

## 2018-01-30 NOTE — Assessment & Plan Note (Addendum)
Patient given injection today.  Tolerated the procedure well.  Do not feel that more medications would be beneficial with patient's other comorbidities.  Patient needs to try to be active to help with some of the other chronic comorbidities.  We did discuss with her about possible aquatic therapy which patient declined.  Patient will continue seeing me every 10 weeks

## 2018-01-30 NOTE — Assessment & Plan Note (Signed)
Patient given injection.  Tolerated the procedure well.  Discussed icing regimen and home exercises.  Discussed which activities to doing which wants to avoid.  Increase activity slowly over the course the next several days.  Discussed icing regimen and home exercises.  Discussed which activities to do which was to avoid.  Follow-up again in 10 weeks

## 2018-01-30 NOTE — Assessment & Plan Note (Signed)
Likely contributing to some of the pain.  Patient is on diuretics.  Discussed compression and elevation.

## 2018-01-30 NOTE — Assessment & Plan Note (Addendum)
Etiology unclear but suspect flare of underlying DJD/DDD, pt declines f/u MRI for now, ok for referral Dr Ramos/ortho pain management, and trial of tramadol prn, muscle relaxer prn, and lidoderm prn  Note:  Total time for pt hx, exam, review of record with pt in the room, determination of diagnoses and plan for further eval and tx is > 40 min, with over 50% spent in coordination and counseling of patient including the differential dx, tx, further evaluation and other management of LBP, DM and allergic rhiniits

## 2018-01-30 NOTE — Assessment & Plan Note (Signed)
stable overall by history and exam, recent data reviewed with pt, and pt to continue medical treatment as before,  to f/u any worsening symptoms or concerns, for [place] meter and supplies to pharmacy

## 2018-01-30 NOTE — Assessment & Plan Note (Signed)
Ok to restart allegra prn

## 2018-01-30 NOTE — Patient Instructions (Signed)
See you again in 10 weeks

## 2018-02-04 ENCOUNTER — Other Ambulatory Visit: Payer: Medicare HMO | Admitting: Orthotics

## 2018-02-11 ENCOUNTER — Other Ambulatory Visit: Payer: Medicare HMO | Admitting: Orthotics

## 2018-02-12 ENCOUNTER — Ambulatory Visit: Payer: Medicare HMO | Admitting: Family Medicine

## 2018-02-18 NOTE — Progress Notes (Deleted)
HPI: FU HCM. Patient previously lived in [place]. Records from there include: Nuclear study in November of 2011 showed an ejection fraction of 62% and normal perfusion. Exercise treadmill February 2014 showed poor exercise capacity and study was technically difficult. However no hypotension noted. Carotid Dopplers January 2014 showed no significant carotid disease. 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 Last echocardiogram May 2018 showed normal LV function LVH with severe focal basal septal hypertrophy no LVOT gradient, mild mitral stenosis moderate to severe left atrial enlargement; findings could be consistent with hypertrophic cardiomyopathy.  Since last seen,   Current Outpatient Medications  Medication Sig Dispense Refill  . albuterol (PROAIR HFA) 108 (90 Base) MCG/ACT inhaler Inhale 2 puffs into the lungs 2 (two) times daily. 8.5 Inhaler 2  . albuterol (VENTOLIN HFA) 108 (90 Base) MCG/ACT inhaler Inhale 2 puffs into the lungs 2 (two) times daily. 18 Inhaler 2  . allopurinol (ZYLOPRIM) 100 MG tablet Take 1 tablet (100 mg total) by mouth daily. 90 tablet 3  . ammonium lactate (AMLACTIN) 12 % cream Apply topically as needed for dry skin. 385 g 0  . aspirin 81 MG tablet Take 162 mg by mouth daily.     Marland Kitchen atorvastatin (LIPITOR) 40 MG tablet Take 1 tablet (40 mg total) by mouth daily. 90 tablet 3  . Blood Glucose Monitoring Suppl (ACCU-CHEK AVIVA PLUS) w/Device KIT Use to check blood sugar three times daily 1 kit 2  . cetirizine (ZYRTEC) 10 MG tablet TAKE 1 TABLET (10 MG TOTAL) BY MOUTH DAILY as needed 90 tablet 3  . Continuous Blood Gluc Receiver (FREESTYLE LIBRE READER) DEVI Apply 1 Device topically 4 (four) times daily as needed. E11.9 1 Device 0  . Continuous Blood Gluc Sensor (FREESTYLE LIBRE 14 DAY SENSOR) MISC Apply 1 Device topically every 14 (fourteen) days. E11.9 6 each 3  . diltiazem (CARDIZEM CD) 180 MG 24 hr capsule TAKE 1 CAPSULE (180 MG TOTAL) BY  MOUTH DAILY. 90 capsule 2  . furosemide (LASIX) 20 MG tablet TAKE 3 TABLETS (60 MG TOTAL) BY MOUTH 2 TIMES DAILY. 180 tablet 2  . gabapentin (NEURONTIN) 300 MG capsule TAKE 1 TO 2 TABLETS BY MOUTH AT BEDTIME 60 capsule 5  . glipiZIDE (GLUCOTROL XL) 2.5 MG 24 hr tablet TAKE 1 TABLET BY MOUTH DAILY WITH BREAKFAST. 90 tablet 3  . glucose blood (ACCU-CHEK AVIVA PLUS) test strip Use to check blood sugar three times daily 300 each 3  . lidocaine (LIDODERM) 5 % Place 1 patch onto the skin daily. Remove & Discard patch within 12 hours or as directed by MD 40 patch 1  . meclizine (ANTIVERT) 25 MG tablet TAKE 1 TABLET (25 MG TOTAL) BY MOUTH 3 (THREE) TIMES DAILY. 60 tablet 2  . meloxicam (MOBIC) 15 MG tablet TAKE 1 TABLET (15 MG TOTAL) BY MOUTH DAILY AS NEEDED FOR PAIN. 90 tablet 1  . metoprolol tartrate (LOPRESSOR) 50 MG tablet TAKE 1 TABLET BY MOUTH TWICE A DAY 180 tablet 3  . ONETOUCH DELICA PLUS LANCETS MISC Apply 1 Device topically daily. Use to check lood sugar once daily E11.9 100 each 3  . pantoprazole (PROTONIX) 40 MG tablet Take 1 tablet (40 mg total) by mouth daily. 90 tablet 3  . tiZANidine (ZANAFLEX) 2 MG tablet Take 1 tablet (2 mg total) by mouth every 6 (six) hours as needed for muscle spasms. 40 tablet 1  . traMADol (ULTRAM) 50 MG tablet Take 1 tablet (50 mg  total) by mouth every 6 (six) hours as needed. 30 tablet 0  . triamcinolone (NASACORT AQ) 55 MCG/ACT AERO nasal inhaler Place 2 sprays into the nose daily. 1 Inhaler 12   No current facility-administered medications for this visit.      Past Medical History:  Diagnosis Date  . Abnormal MRI, spine 11/2011   ?discitis L5-S1, s/p CT bx 12/12/11  . Allergic rhinitis, cause unspecified 01/31/2013  . Arthritis   . Asthma   . Cervical cancer ([place])   . Chronic diastolic heart failure ([place])   . Diabetic retinopathy ([place])   . GERD (gastroesophageal reflux disease)   . H/O cardiovascular stress test    Nuclear study in 2011 normal  perfusion  . HOCM (hypertrophic obstructive cardiomyopathy) ([place])    Echo 12/19/11: Severe LVH, EF 55-65%, dynamic obstruction, wall motion normal, grade 1 diastolic dysfunction, systolic anterior motion of the mitral valve with mild MS and mild MR, mild LAE, PASP 34  . Hyperlipidemia   . Hypertension   . Kidney stones   . Obesity   . Type II or unspecified type diabetes mellitus without mention of complication, uncontrolled 01/31/2013    Past Surgical History:  Procedure Laterality Date  . ABDOMINAL HYSTERECTOMY    . JOINT REPLACEMENT     right knee 2006  . rotater cuff     right, 2005    Social History   Socioeconomic History  . Marital status: Widowed    Spouse name: Not on file  . Number of children: 5  . Years of education: 73  . Highest education level: Not on file  Occupational History  . Occupation: Retired Mining engineer  . Financial resource strain: Not on file  . Food insecurity:    Worry: Not on file    Inability: Not on file  . Transportation needs:    Medical: Not on file    Non-medical: Not on file  Tobacco Use  . Smoking status: Never Smoker  . Smokeless tobacco: Never Used  Substance and Sexual Activity  . Alcohol use: No    Alcohol/week: 0.0 standard drinks  . Drug use: No  . Sexual activity: Not on file  Lifestyle  . Physical activity:    Days per week: Not on file    Minutes per session: Not on file  . Stress: Not on file  Relationships  . Social connections:    Talks on phone: Not on file    Gets together: Not on file    Attends religious service: Not on file    Active member of club or organization: Not on file    Attends meetings of clubs or organizations: Not on file    Relationship status: Not on file  . Intimate partner violence:    Fear of current or ex partner: Not on file    Emotionally abused: Not on file    Physically abused: Not on file    Forced sexual activity: Not on file  Other Topics Concern  . Not on file    Social History Narrative  . Not on file    Family History  Problem Relation Age of Onset  . Heart disease Father   . Arthritis Other   . Heart disease Other   . Hypertension Other   . Diabetes Other   . Alcohol abuse Other   . Arthritis Other   . Cancer Other        Lung Cancer  . Heart disease Other   .  Hypertension Other   . Sudden death Other   . Kidney disease Other   . Mental illness Other   . Diabetes Other   . Colon cancer Neg Hx     ROS: no fevers or chills, productive cough, hemoptysis, dysphasia, odynophagia, melena, hematochezia, dysuria, hematuria, rash, seizure activity, orthopnea, PND, pedal edema, claudication. Remaining systems are negative.  Physical Exam: Well-developed well-nourished in no acute distress.  Skin is warm and dry.  HEENT is normal.  Neck is supple.  Chest is clear to auscultation with normal expansion.  Cardiovascular exam is regular rate and rhythm.  Abdominal exam nontender or distended. No masses palpated. Extremities show no edema. neuro grossly intact  ECG- personally reviewed  A/P  1  Kirk Ruths, MD

## 2018-02-21 ENCOUNTER — Other Ambulatory Visit: Payer: Medicare HMO | Admitting: Orthotics

## 2018-02-22 ENCOUNTER — Ambulatory Visit: Payer: Medicare HMO | Admitting: Cardiology

## 2018-03-07 ENCOUNTER — Other Ambulatory Visit: Payer: Self-pay

## 2018-03-07 ENCOUNTER — Ambulatory Visit: Payer: Medicare HMO | Attending: Internal Medicine

## 2018-03-07 DIAGNOSIS — M6281 Muscle weakness (generalized): Secondary | ICD-10-CM | POA: Diagnosis not present

## 2018-03-07 DIAGNOSIS — G8929 Other chronic pain: Secondary | ICD-10-CM | POA: Diagnosis not present

## 2018-03-07 DIAGNOSIS — M5442 Lumbago with sciatica, left side: Secondary | ICD-10-CM | POA: Diagnosis not present

## 2018-03-07 DIAGNOSIS — R2689 Other abnormalities of gait and mobility: Secondary | ICD-10-CM | POA: Insufficient documentation

## 2018-03-07 NOTE — Therapy (Signed)
[facility] [address] [place], [place], [address] Phone: [phone]   Fax:  [phone]  Physical Therapy Evaluation  Patient Details  Name: Alyssa Ingram MRN: [id number] Date of Birth: 09/05/1945 Referring Provider (PT): Cathlean Cower, MD   Encounter Date: 03/07/2018  PT End of Session - 03/07/18 1327    Visit Number  1    Date for PT Re-Evaluation  05/02/18    Authorization Type  Medicare    PT Start Time  1230    PT Stop Time  1325    PT Time Calculation (min)  55 min    Activity Tolerance  Patient tolerated treatment well    Behavior During Therapy  [facility] for tasks assessed/performed       Past Medical History:  Diagnosis Date  . Abnormal MRI, spine 11/2011   ?discitis L5-S1, s/p CT bx 12/12/11  . Allergic rhinitis, cause unspecified 01/31/2013  . Arthritis   . Asthma   . Cervical cancer ([place])   . Chronic diastolic heart failure ([place])   . Diabetic retinopathy ([place])   . GERD (gastroesophageal reflux disease)   . H/O cardiovascular stress test    Nuclear study in 2011 normal perfusion  . HOCM (hypertrophic obstructive cardiomyopathy) ([place])    Echo 12/19/11: Severe LVH, EF 55-65%, dynamic obstruction, wall motion normal, grade 1 diastolic dysfunction, systolic anterior motion of the mitral valve with mild MS and mild MR, mild LAE, PASP 34  . Hyperlipidemia   . Hypertension   . Kidney stones   . Obesity   . Type II or unspecified type diabetes mellitus without mention of complication, uncontrolled 01/31/2013    Past Surgical History:  Procedure Laterality Date  . ABDOMINAL HYSTERECTOMY    . JOINT REPLACEMENT     right knee 2006  . rotater cuff     right, 2005    There were no vitals filed for this visit.   Subjective Assessment - 03/07/18 1245    Subjective  Pt presents to PT with complaints of LBP and Lt LE radiculopathy of a chronic nature.  Pt has limited mobility wiht a quad cane and has  multiple areas of OA in knees, hips and spine.      Pertinent History  Rt TKA 2006, injections in Lt knee, hip and shoulder regularly    How long can you sit comfortably?  no limitation    Diagnostic tests  MRI:   grade 1 spondylosisthesis L4-5 and L5-S1, anterolisthesis L4 on L5 and L5 on S1, spinal stenosis and facet arthosis.    Patient Stated Goals  reduce LBP- not able to describe a particular activity    Currently in Pain?  Yes    Pain Score  7    no pain today   Pain Location  Back    Pain Orientation  Left    Pain Descriptors / Indicators  Sore;Aching    Pain Type  Chronic pain    Pain Radiating Towards  Lt LE radiculopathy    Pain Onset  More than a month ago    Pain Frequency  Intermittent    Aggravating Factors   "moving the wrong way", bending to pick something from the floor    Pain Relieving Factors  pain medication, Tiger balm         [facility] PT Assessment - 03/07/18 0001      Assessment   Medical Diagnosis  Low back pain with sciatica    Referring  Provider (PT)  Cathlean Cower, MD    Onset Date/Surgical Date  03/07/17    Next MD Visit  none scheduled    Prior Therapy  none      Precautions   Precautions  Fall      Restrictions   Weight Bearing Restrictions  No      Balance Screen   Has the patient fallen in the past 6 months  Yes    How many times?  2   fall out of the chair-not when standing or walking   Has the patient had a decrease in activity level because of a fear of falling?   No    Is the patient reluctant to leave their home because of a fear of falling?   No      Home Environment   Living Environment  Private residence    Living Arrangements  Other relatives    Type of Home  Apartment    Home Access  Level entry    [place] - quad      Prior Function   Level of Independence  Independent with basic ADLs    Vocation  Retired    Leisure  crossword puzzles, reading, TV, active in [place]   Overall Cognitive Status   Within Functional Limits for tasks assessed      Observation/Other Assessments   Focus on Therapeutic Outcomes (FOTO)   51% limitation      Posture/Postural Control   Posture/Postural Control  Postural limitations    Postural Limitations  Flexed trunk;Forward head;Rounded Shoulders      ROM / Strength   AROM / PROM / Strength  AROM;Strength      AROM   Overall AROM   Deficits    Overall AROM Comments  Lumbar A/ROM is limited by 50% in all directions except flexion is normal      Strength   Overall Strength  Deficits    Overall Strength Comments  gross testing to arms 4-/5, hips 3+/5, knees 4-/5      Palpation   Palpation comment  palpable tenderness over Lt lumbar paraspinals and gluteals      Transfers   Transfers  Sit to Stand;Stand to Sit    Sit to Stand  6: Modified independent (Device/Increase time);Without upper extremity assist    Stand to Sit  6: Modified independent (Device/Increase time);With upper extremity assist      Ambulation/Gait   Ambulation/Gait  Yes    Ambulation/Gait Assistance  4: Min guard    Assistive device  Small based quad cane    Gait Pattern  Step-to pattern;Decreased stride length;Trunk flexed                Objective measurements completed on examination: See above findings.              PT Education - 03/07/18 1319    Education Details   Access Code: [email or web address]     Person(s) Educated  Patient    Methods  Explanation;Demonstration;Handout    Comprehension  Verbalized understanding;Returned demonstration       PT Short Term Goals - 03/07/18 1336      PT SHORT TERM GOAL #1   Title  be independent in initial HEP    Time  4    Status  New    Target Date  04/04/18      PT SHORT TERM GOAL #2   Title  report regular episodes  of exercise throughout the day to reduce long stretches in the recliner    Time  4    Period  Weeks    Status  New    Target Date  04/04/18      PT SHORT TERM GOAL #3   Title  report a 25%  reduction in LBP with daily tasks    Time  4    Period  Weeks    Status  New    Target Date  04/04/18        PT Long Term Goals - 03/07/18 1337      PT LONG TERM GOAL #1   Title  be independent in advanced HEP    Time  8    Period  Weeks    Status  New    Target Date  05/02/18      PT LONG TERM GOAL #2   Title  reduce FOTO to < or = to 4% limitation    Time  8    Period  Weeks    Target Date  05/02/18      PT LONG TERM GOAL #3   Title  report a 50% reduction in LBP with daily tasks    Time  8    Period  Weeks    Status  New    Target Date  05/02/18      PT LONG TERM GOAL #4   Title  improve LE strength to ambulate from front lobby to treatment room (~50 ft) in < or = to 1.5  minutes    Time  8    Period  Weeks    Status  New    Target Date  05/02/18             Plan - 03/07/18 1333    Clinical Impression Statement  Pt presents to PT with a complaints of LBP and Lt LE radiculopathy. Pt with chronic immobility and knee and hip OA in addition to congestive heart failure. Recent MRI of the lumbar spine showed grade 1 spondylosisthesis L4-5 and L5-S1, anterolisthesis L4 on L5 and L5 on S1, spinal stenosis and facet arthosis.  Pt received an injection in the Lt trochanteric bursa by Dr Charlann Boxer recently.  Pt demonstrates slow mobility with small base quad cane and flexed trunk.  Pt utilizes max UE support with sit to stand and stand to sit.  Lt knee with limited A/ROM, not formally tested due to pain.  Pt with 3+/5 to 4-/5 bil UE and LE strength.  Pt will benefit from skilled PT to address LE stiffness, weakness, limited mobility and LBP.    History and Personal Factors relevant to plan of care:  CHF, diabetes, widespread OA, cancer, Rt TKA    Clinical Presentation  Evolving    Clinical Presentation due to:  Lt LE radiculopathy, Lt LE edema, decline in mobility    Clinical Decision Making  Moderate    Rehab Potential  Good    Clinical Impairments Affecting Rehab  Potential  Lt LE edema, limited functional mobility, multiple comorbidities    PT Frequency  1x / week    PT Duration  8 weeks    PT Treatment/Interventions  ADLs/Self Care Home Management;Cryotherapy;Electrical Stimulation;Moist Heat;Ultrasound;Therapeutic exercise;Therapeutic activities;Functional mobility training;Gait training;Stair training;Neuromuscular re-education;Patient/family education;Passive range of motion;Manual techniques;Taping    PT Next Visit Plan  review HEP, improve mobility, e-stim and heat for low back    PT Home Exercise Plan   Access Code: [email or web address]  Consulted and Agree with Plan of Care  Patient;Family member/caregiver    Family Member Consulted  adult family members       Patient will benefit from skilled therapeutic intervention in order to improve the following deficits and impairments:  Impaired flexibility, Decreased safety awareness, Decreased activity tolerance, Decreased endurance, Decreased range of motion, Decreased strength, Postural dysfunction, Decreased mobility, Abnormal gait, Improper body mechanics, Pain  Visit Diagnosis: Chronic bilateral low back pain with left-sided sciatica - Plan: PT plan of care cert/re-cert  Muscle weakness (generalized) - Plan: PT plan of care cert/re-cert  Other abnormalities of gait and mobility - Plan: PT plan of care cert/re-cert     Problem List Patient Active Problem List   Diagnosis Date Noted  . Greater trochanteric bursitis of left hip 01/30/2018  . Low back pain 06/21/2017  . Lateral epicondylitis of right elbow 06/20/2017  . HLD (hyperlipidemia) 08/24/2016  . Right shoulder pain 08/24/2016  . Debility 08/10/2016  . Viral illness 06/13/2016  . Headache 12/09/2015  . Degenerative arthritis of left knee 11/23/2015  . Acute renal failure syndrome ([place]) 02/04/2015  . Bilateral foot pain 01/26/2015  . History of knee replacement procedure of right knee 08/06/2014  . Arthritis of left lower extremity  08/06/2014  . Cellulitis of leg, left 04/14/2014  . Acute bronchitis 04/14/2014  . Chronic venous insufficiency 04/14/2014  . Left leg pain 03/12/2014  . Patellar tendonitis of left knee 07/31/2013  . Superficial phlebitis 01/31/2013  . Chronic pain of right knee 01/31/2013  . Diabetes ([place]) 01/31/2013  . Allergic rhinitis 01/31/2013  . Hypertrophic obstructive cardiomyopathy ([place]) 05/03/2012  . Left lumbar radiculopathy 11/22/2011  . Right knee pain 09/13/2011  . Cough 09/13/2011  . Arthritis   . Mild persistent asthma   . Cervical cancer ([place])   . GERD (gastroesophageal reflux disease)   . Hypertension   . Kidney stones   . CHF (congestive heart failure) ([place])   . Encounter for well adult exam with abnormal findings 09/10/2011   Sigurd Sos, PT 03/07/18 1:45 PM  [facility] [address], [place], [place], [address] Phone: [phone]   Fax:  [phone]  Name: Alyssa Ingram MRN: [id number] Date of Birth: 02-Mar-1946

## 2018-03-07 NOTE — Patient Instructions (Signed)
Access Code: [email or web address]  URL: [email or web address]  Date: 03/07/2018  Prepared by: Sigurd Sos   Exercises  Seated Long Arc Quad - 10 reps - 2 sets - 5 hold - 3x daily - 7x weekly  Seated March - 10 reps - 3 sets - 3x daily - 7x weekly  Seated Heel Toe Raises - 10 reps - 2 sets - 3x daily - 7x weekly  Seated Hamstring Stretch - 3 reps - 2 sets - 20 hold - 3x daily - 7x weekly

## 2018-03-12 ENCOUNTER — Other Ambulatory Visit: Payer: Self-pay | Admitting: Internal Medicine

## 2018-03-18 ENCOUNTER — Ambulatory Visit: Payer: Medicare HMO | Admitting: Physical Therapy

## 2018-03-20 ENCOUNTER — Ambulatory Visit: Payer: Medicare HMO | Attending: Internal Medicine | Admitting: Physical Therapy

## 2018-03-20 DIAGNOSIS — R2689 Other abnormalities of gait and mobility: Secondary | ICD-10-CM | POA: Diagnosis not present

## 2018-03-20 DIAGNOSIS — M6281 Muscle weakness (generalized): Secondary | ICD-10-CM

## 2018-03-20 DIAGNOSIS — M5442 Lumbago with sciatica, left side: Secondary | ICD-10-CM | POA: Insufficient documentation

## 2018-03-20 DIAGNOSIS — G8929 Other chronic pain: Secondary | ICD-10-CM

## 2018-03-20 NOTE — Therapy (Signed)
[facility] [address] [place], [place], [address] Phone: [phone]   Fax:  [phone]  Physical Therapy Treatment  Patient Details  Name: Alyssa Ingram MRN: [id number] Date of Birth: 1946/05/08 Referring Provider (PT): Cathlean Cower, MD   Encounter Date: 03/20/2018  PT End of Session - 03/20/18 1616    Visit Number  2    Date for PT Re-Evaluation  05/02/18    Authorization Type  Medicare    PT Start Time  1615    PT Stop Time  1704    PT Time Calculation (min)  49 min    Activity Tolerance  Patient tolerated treatment well    Behavior During Therapy  [facility] for tasks assessed/performed       Past Medical History:  Diagnosis Date  . Abnormal MRI, spine 11/2011   ?discitis L5-S1, s/p CT bx 12/12/11  . Allergic rhinitis, cause unspecified 01/31/2013  . Arthritis   . Asthma   . Cervical cancer ([place])   . Chronic diastolic heart failure ([place])   . Diabetic retinopathy ([place])   . GERD (gastroesophageal reflux disease)   . H/O cardiovascular stress test    Nuclear study in 2011 normal perfusion  . HOCM (hypertrophic obstructive cardiomyopathy) ([place])    Echo 12/19/11: Severe LVH, EF 55-65%, dynamic obstruction, wall motion normal, grade 1 diastolic dysfunction, systolic anterior motion of the mitral valve with mild MS and mild MR, mild LAE, PASP 34  . Hyperlipidemia   . Hypertension   . Kidney stones   . Obesity   . Type II or unspecified type diabetes mellitus without mention of complication, uncontrolled 01/31/2013    Past Surgical History:  Procedure Laterality Date  . ABDOMINAL HYSTERECTOMY    . JOINT REPLACEMENT     right knee 2006  . rotater cuff     right, 2005    There were no vitals filed for this visit.  Subjective Assessment - 03/20/18 1616    Subjective  Pt ambulates slow with quad cane.    Currently in Pain?  Yes    Pain Score  8     Pain Location  Leg    Pain Orientation  Left    Pain  Descriptors / Indicators  Throbbing    Multiple Pain Sites  No                       [facility] Adult PT Treatment/Exercise - 03/20/18 0001      Ambulation/Gait   Ambulation/Gait  Yes    Ambulation/Gait Assistance  6: Modified independent (Device/Increase time)    Ambulation Distance (Feet)  80 Feet    Assistive device  Rolling walker    Gait Pattern  Step-through pattern   small step through   Curb  3: Mod assist   with cane and walker +1 son   Gait Comments  VC for posture and how to use RW      Knee/Hip Exercises: Aerobic   Nustep  L1 x 4 min   PTA assist pt on/off     Knee/Hip Exercises: Seated   Long Arc Quad  AROM;Strengthening;Right;Left;1 set;10 reps   VC to slow speed   Marching  Strengthening;Both;1 set;10 reps    Sit to [employer]  2 sets;5 reps;with UE support   VC to use  LE > UE              PT Short Term Goals - 03/07/18 1336  PT SHORT TERM GOAL #1   Title  be independent in initial HEP    Time  4    Status  New    Target Date  04/04/18      PT SHORT TERM GOAL #2   Title  report regular episodes of exercise throughout the day to reduce long stretches in the recliner    Time  4    Period  Weeks    Status  New    Target Date  04/04/18      PT SHORT TERM GOAL #3   Title  report a 25% reduction in LBP with daily tasks    Time  4    Period  Weeks    Status  New    Target Date  04/04/18        PT Long Term Goals - 03/07/18 1337      PT LONG TERM GOAL #1   Title  be independent in advanced HEP    Time  8    Period  Weeks    Status  New    Target Date  05/02/18      PT LONG TERM GOAL #2   Title  reduce FOTO to < or = to 4% limitation    Time  8    Period  Weeks    Target Date  05/02/18      PT LONG TERM GOAL #3   Title  report a 50% reduction in LBP with daily tasks    Time  8    Period  Weeks    Status  New    Target Date  05/02/18      PT LONG TERM GOAL #4   Title  improve LE strength to ambulate from front lobby  to treatment room (~50 ft) in < or = to 1.5  minutes    Time  8    Period  Weeks    Status  New    Target Date  05/02/18            Plan - 03/20/18 1618    Clinical Impression Statement  Pt presents with reported high pain in LT leg. Pt very slow ambulating with her quad cane. PTA gave pt to practice with rolling walker. This extra support increased pt's speed, supported her back, and leg better. Family may look into using a RW for days her leg is bothering her or she feels weak. She is compliant inher initial HEP with the help of family. No pain reported when pt performed her exercises or walked with RW.     Rehab Potential  Good    Clinical Impairments Affecting Rehab Potential  Lt LE edema, limited functional mobility, multiple comorbidities    PT Frequency  1x / week    PT Duration  8 weeks    PT Treatment/Interventions  ADLs/Self Care Home Management;Cryotherapy;Electrical Stimulation;Moist Heat;Ultrasound;Therapeutic exercise;Therapeutic activities;Functional mobility training;Gait training;Stair training;Neuromuscular re-education;Patient/family education;Passive range of motion;Manual techniques;Taping    PT Next Visit Plan  review HEP, improve mobility,     PT Home Exercise Plan   Access Code: [email or web address]     Consulted and Agree with Plan of Care  Patient;Family member/caregiver    Family Member Consulted  adult family members       Patient will benefit from skilled therapeutic intervention in order to improve the following deficits and impairments:  Impaired flexibility, Decreased safety awareness, Decreased activity tolerance, Decreased endurance, Decreased range of motion, Decreased strength, Postural dysfunction,  Decreased mobility, Abnormal gait, Improper body mechanics, Pain  Visit Diagnosis: Chronic bilateral low back pain with left-sided sciatica  Muscle weakness (generalized)  Other abnormalities of gait and mobility     Problem List Patient Active Problem List    Diagnosis Date Noted  . Greater trochanteric bursitis of left hip 01/30/2018  . Low back pain 06/21/2017  . Lateral epicondylitis of right elbow 06/20/2017  . HLD (hyperlipidemia) 08/24/2016  . Right shoulder pain 08/24/2016  . Debility 08/10/2016  . Viral illness 06/13/2016  . Headache 12/09/2015  . Degenerative arthritis of left knee 11/23/2015  . Acute renal failure syndrome ([place]) 02/04/2015  . Bilateral foot pain 01/26/2015  . History of knee replacement procedure of right knee 08/06/2014  . Arthritis of left lower extremity 08/06/2014  . Cellulitis of leg, left 04/14/2014  . Acute bronchitis 04/14/2014  . Chronic venous insufficiency 04/14/2014  . Left leg pain 03/12/2014  . Patellar tendonitis of left knee 07/31/2013  . Superficial phlebitis 01/31/2013  . Chronic pain of right knee 01/31/2013  . Diabetes ([place]) 01/31/2013  . Allergic rhinitis 01/31/2013  . Hypertrophic obstructive cardiomyopathy ([place]) 05/03/2012  . Left lumbar radiculopathy 11/22/2011  . Right knee pain 09/13/2011  . Cough 09/13/2011  . Arthritis   . Mild persistent asthma   . Cervical cancer ([place])   . GERD (gastroesophageal reflux disease)   . Hypertension   . Kidney stones   . CHF (congestive heart failure) ([place])   . Encounter for well adult exam with abnormal findings 09/10/2011    Ashleyann Shoun, PTA 03/20/2018, 5:09 PM  [facility] [address], [place], [place], [address] Phone: [phone]   Fax:  [phone]  Name: MADISSEN WYSE MRN: [id number] Date of Birth: [birth year]

## 2018-03-28 NOTE — Progress Notes (Deleted)
HPI: FU HCM. Patient previously lived in [place]. Records from there include: Nuclear study in November of 2011 showed an ejection fraction of 62% and normal perfusion. Exercise treadmill February 2014 showed poor exercise capacity and study was technically difficult. However no hypotension noted. Carotid Dopplers January 2014 showed no significant carotid disease. 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 Last echocardiogram May 2018 showed vigorous LV function, severe asymmetric basal septal hypertrophy, grade 1 diastolic dysfunction, mild mitral stenosis, mild mitral regurgitation, moderate to severe left atrial enlargement.  Note left ventricular outflow tract gradient minimally increased at 17 mmHg.  Since last seen,   Current Outpatient Medications  Medication Sig Dispense Refill  . albuterol (PROAIR HFA) 108 (90 Base) MCG/ACT inhaler Inhale 2 puffs into the lungs 2 (two) times daily. 8.5 Inhaler 2  . albuterol (VENTOLIN HFA) 108 (90 Base) MCG/ACT inhaler Inhale 2 puffs into the lungs 2 (two) times daily. 18 Inhaler 2  . allopurinol (ZYLOPRIM) 100 MG tablet Take 1 tablet (100 mg total) by mouth daily. 90 tablet 3  . ammonium lactate (AMLACTIN) 12 % cream Apply topically as needed for dry skin. 385 g 0  . aspirin 81 MG tablet Take 162 mg by mouth daily.     Marland Kitchen atorvastatin (LIPITOR) 40 MG tablet Take 1 tablet (40 mg total) by mouth daily. 90 tablet 3  . Blood Glucose Monitoring Suppl (ACCU-CHEK AVIVA PLUS) w/Device KIT Use to check blood sugar three times daily 1 kit 2  . cetirizine (ZYRTEC) 10 MG tablet TAKE 1 TABLET (10 MG TOTAL) BY MOUTH DAILY as needed 90 tablet 3  . Continuous Blood Gluc Receiver (FREESTYLE LIBRE READER) DEVI Apply 1 Device topically 4 (four) times daily as needed. E11.9 1 Device 0  . Continuous Blood Gluc Sensor (FREESTYLE LIBRE 14 DAY SENSOR) MISC Apply 1 Device topically every 14 (fourteen) days. E11.9 6 each 3  . diltiazem (CARDIZEM CD)  180 MG 24 hr capsule TAKE 1 CAPSULE (180 MG TOTAL) BY MOUTH DAILY. 90 capsule 2  . furosemide (LASIX) 20 MG tablet TAKE 3 TABLETS (60 MG TOTAL) BY MOUTH 2 TIMES DAILY. 180 tablet 2  . gabapentin (NEURONTIN) 300 MG capsule TAKE 1 TO 2 TABLETS BY MOUTH AT BEDTIME 60 capsule 5  . glipiZIDE (GLUCOTROL XL) 2.5 MG 24 hr tablet TAKE 1 TABLET BY MOUTH DAILY WITH BREAKFAST. 90 tablet 3  . glucose blood (ACCU-CHEK AVIVA PLUS) test strip Use to check blood sugar three times daily 300 each 3  . lidocaine (LIDODERM) 5 % Place 1 patch onto the skin daily. Remove & Discard patch within 12 hours or as directed by MD 40 patch 1  . meclizine (ANTIVERT) 25 MG tablet TAKE 1 TABLET (25 MG TOTAL) BY MOUTH 3 (THREE) TIMES DAILY. 60 tablet 2  . meloxicam (MOBIC) 15 MG tablet TAKE 1 TABLET (15 MG TOTAL) BY MOUTH DAILY AS NEEDED FOR PAIN. 90 tablet 1  . metoprolol tartrate (LOPRESSOR) 50 MG tablet TAKE 1 TABLET BY MOUTH TWICE A DAY 180 tablet 3  . ONETOUCH DELICA PLUS LANCETS MISC Apply 1 Device topically daily. Use to check lood sugar once daily E11.9 100 each 3  . pantoprazole (PROTONIX) 40 MG tablet Take 1 tablet (40 mg total) by mouth daily. 90 tablet 3  . tiZANidine (ZANAFLEX) 2 MG tablet Take 1 tablet (2 mg total) by mouth every 6 (six) hours as needed for muscle spasms. 40 tablet 1  . traMADol (ULTRAM) 50  MG tablet Take 1 tablet (50 mg total) by mouth every 6 (six) hours as needed. 30 tablet 0  . triamcinolone (NASACORT AQ) 55 MCG/ACT AERO nasal inhaler Place 2 sprays into the nose daily. 1 Inhaler 12   No current facility-administered medications for this visit.      Past Medical History:  Diagnosis Date  . Abnormal MRI, spine 11/2011   ?discitis L5-S1, s/p CT bx 12/12/11  . Allergic rhinitis, cause unspecified 01/31/2013  . Arthritis   . Asthma   . Cervical cancer ([place])   . Chronic diastolic heart failure ([place])   . Diabetic retinopathy ([place])   . GERD (gastroesophageal reflux disease)   . H/O cardiovascular  stress test    Nuclear study in 2011 normal perfusion  . HOCM (hypertrophic obstructive cardiomyopathy) ([place])    Echo 12/19/11: Severe LVH, EF 55-65%, dynamic obstruction, wall motion normal, grade 1 diastolic dysfunction, systolic anterior motion of the mitral valve with mild MS and mild MR, mild LAE, PASP 34  . Hyperlipidemia   . Hypertension   . Kidney stones   . Obesity   . Type II or unspecified type diabetes mellitus without mention of complication, uncontrolled 01/31/2013    Past Surgical History:  Procedure Laterality Date  . ABDOMINAL HYSTERECTOMY    . JOINT REPLACEMENT     right knee 2006  . rotater cuff     right, 2005    Social History   Socioeconomic History  . Marital status: Widowed    Spouse name: Not on file  . Number of children: 5  . Years of education: 79  . Highest education level: Not on file  Occupational History  . Occupation: Retired Mining engineer  . Financial resource strain: Not on file  . Food insecurity:    Worry: Not on file    Inability: Not on file  . Transportation needs:    Medical: Not on file    Non-medical: Not on file  Tobacco Use  . Smoking status: Never Smoker  . Smokeless tobacco: Never Used  Substance and Sexual Activity  . Alcohol use: No    Alcohol/week: 0.0 standard drinks  . Drug use: No  . Sexual activity: Not on file  Lifestyle  . Physical activity:    Days per week: Not on file    Minutes per session: Not on file  . Stress: Not on file  Relationships  . Social connections:    Talks on phone: Not on file    Gets together: Not on file    Attends religious service: Not on file    Active member of club or organization: Not on file    Attends meetings of clubs or organizations: Not on file    Relationship status: Not on file  . Intimate partner violence:    Fear of current or ex partner: Not on file    Emotionally abused: Not on file    Physically abused: Not on file    Forced sexual activity: Not on  file  Other Topics Concern  . Not on file  Social History Narrative  . Not on file    Family History  Problem Relation Age of Onset  . Heart disease Father   . Arthritis Other   . Heart disease Other   . Hypertension Other   . Diabetes Other   . Alcohol abuse Other   . Arthritis Other   . Cancer Other        Lung Cancer  .  Heart disease Other   . Hypertension Other   . Sudden death Other   . Kidney disease Other   . Mental illness Other   . Diabetes Other   . Colon cancer Neg Hx     ROS: no fevers or chills, productive cough, hemoptysis, dysphasia, odynophagia, melena, hematochezia, dysuria, hematuria, rash, seizure activity, orthopnea, PND, pedal edema, claudication. Remaining systems are negative.  Physical Exam: Well-developed well-nourished in no acute distress.  Skin is warm and dry.  HEENT is normal.  Neck is supple.  Chest is clear to auscultation with normal expansion.  Cardiovascular exam is regular rate and rhythm.  Abdominal exam nontender or distended. No masses palpated. Extremities show no edema. neuro grossly intact  ECG- personally reviewed  A/P  1  Kirk Ruths, MD

## 2018-03-29 ENCOUNTER — Other Ambulatory Visit: Payer: Self-pay | Admitting: Internal Medicine

## 2018-04-03 ENCOUNTER — Other Ambulatory Visit: Payer: Self-pay | Admitting: Internal Medicine

## 2018-04-05 ENCOUNTER — Ambulatory Visit: Payer: Medicare HMO | Admitting: Cardiology

## 2018-04-05 ENCOUNTER — Telehealth: Payer: Self-pay | Admitting: Internal Medicine

## 2018-04-05 NOTE — Telephone Encounter (Signed)
Copied from [place] [id number]. Topic: Quick Communication - See Telephone Encounter >> Apr 05, 2018  4:47 PM Bea Graff, NT wrote: CRM for notification. See Telephone encounter for: 04/05/18. Pt calling to check status on the order she states she requested about a month ago for a walker. She states she would like to see if this can be ordered before Thanksgiving as she will be doing a lot of walking during the holiday. Please advise.

## 2018-04-08 ENCOUNTER — Ambulatory Visit: Payer: Medicare HMO | Admitting: Internal Medicine

## 2018-04-08 NOTE — Telephone Encounter (Signed)
Dr. Jenny Reichmann I checked back in her chart and do not see where she asked for a walker. Please advise.

## 2018-04-09 ENCOUNTER — Telehealth: Payer: Self-pay | Admitting: Internal Medicine

## 2018-04-09 DIAGNOSIS — R269 Unspecified abnormalities of gait and mobility: Secondary | ICD-10-CM

## 2018-04-09 NOTE — Telephone Encounter (Signed)
There was no specific request on the patient part, but the PT note recently nov 2019 did indicate a need for the rolling walker - ok for this, done hardcopyt to [facility]

## 2018-04-09 NOTE — Telephone Encounter (Signed)
Pt has been informed. Will back back with where she wants it faxed or to have someone pick it up.

## 2018-04-09 NOTE — Telephone Encounter (Signed)
See note - ok for walker

## 2018-04-10 ENCOUNTER — Ambulatory Visit: Payer: Medicare HMO | Admitting: Physical Therapy

## 2018-04-15 NOTE — Telephone Encounter (Signed)
Called pt to follow up, LVM on mobile number.

## 2018-04-16 NOTE — Telephone Encounter (Addendum)
Pt calling back to provide info for rolling walker [place] Phone [phone] Fax [phone]  Thank you!  Pt also would like to inquire about getting a shower chair.  Would like to know if Dr Jenny Reichmann will order this too?

## 2018-04-16 NOTE — Telephone Encounter (Signed)
Both orders faxed to [facility]

## 2018-04-16 NOTE — Addendum Note (Signed)
Addended by: Juliet Rude on: 04/16/2018 01:12 PM   Modules accepted: Orders

## 2018-04-17 ENCOUNTER — Ambulatory Visit: Payer: Medicare HMO | Admitting: Physical Therapy

## 2018-04-18 ENCOUNTER — Ambulatory Visit: Payer: Medicare HMO | Admitting: Podiatry

## 2018-04-18 ENCOUNTER — Other Ambulatory Visit: Payer: Medicare HMO | Admitting: Orthotics

## 2018-04-19 ENCOUNTER — Ambulatory Visit: Payer: Medicare HMO | Admitting: Internal Medicine

## 2018-04-22 ENCOUNTER — Ambulatory Visit: Payer: Medicare HMO | Admitting: Physical Therapy

## 2018-04-23 ENCOUNTER — Ambulatory Visit: Payer: Medicare HMO | Admitting: Podiatry

## 2018-04-23 ENCOUNTER — Ambulatory Visit (INDEPENDENT_AMBULATORY_CARE_PROVIDER_SITE_OTHER): Payer: Medicare HMO | Admitting: Orthotics

## 2018-04-23 DIAGNOSIS — M79675 Pain in left toe(s): Secondary | ICD-10-CM | POA: Diagnosis not present

## 2018-04-23 DIAGNOSIS — M2141 Flat foot [pes planus] (acquired), right foot: Secondary | ICD-10-CM

## 2018-04-23 DIAGNOSIS — M2142 Flat foot [pes planus] (acquired), left foot: Secondary | ICD-10-CM | POA: Diagnosis not present

## 2018-04-23 DIAGNOSIS — M79674 Pain in right toe(s): Secondary | ICD-10-CM

## 2018-04-23 DIAGNOSIS — B351 Tinea unguium: Secondary | ICD-10-CM | POA: Diagnosis not present

## 2018-04-23 DIAGNOSIS — E1149 Type 2 diabetes mellitus with other diabetic neurological complication: Secondary | ICD-10-CM

## 2018-04-24 ENCOUNTER — Ambulatory Visit: Payer: Medicare HMO | Admitting: Internal Medicine

## 2018-04-24 DIAGNOSIS — Z0289 Encounter for other administrative examinations: Secondary | ICD-10-CM

## 2018-04-24 NOTE — Progress Notes (Signed)
Subjective: 72 y.o. returns the office today for painful, elongated, thickened toenails which she cannot trim herself.  She is also think this will be due to put on her dry skin for her feet and legs.  Denies any open sores or drainage.  She has some dry skin to her feet as well.  She has some occasional numbness and she is on gabapentin for neuropathy.  This is not getting any worse.  She was going to physical therapy which is helpful however she has not been going recently.  She has no other concerns today.  PCP: [name], MD  Objective: AAO 3, NAD DP/PT pulses palpable, CRT less than 3 seconds Chronic bilateral lower extremity edema.  Venous insufficiency changes are present. Nails hypertrophic, dystrophic, elongated, brittle, discolored 10. There is tenderness overlying the nails 1-5 bilaterally. There is no surrounding erythema or drainage along the nail sites. Dry skin to the heels.  No skin fissures. No open lesions or pre-ulcerative lesions are identified. No pain with calf compression, warmth, erythema.  Assessment: Patient presents with symptomatic onychomycosis  Plan: -Treatment options including alternatives, risks, complications were discussed -Nails sharply debrided 10 without complication/bleeding. -Continue gabapentin for neuropathy. -I recommend her to get back to physical therapy for range of motion as well as the swelling.  She is to call the office to reschedule her appointment. -Discussed daily foot inspection. If there are any changes, to call the office immediately.  -Follow-up in 3 months or sooner if any problems are to arise. In the meantime, encouraged to call the office with any questions, concerns, changes symptoms.  Celesta Gentile, DPM

## 2018-04-26 ENCOUNTER — Other Ambulatory Visit: Payer: Self-pay | Admitting: Internal Medicine

## 2018-04-29 ENCOUNTER — Ambulatory Visit: Payer: Medicare HMO

## 2018-05-01 ENCOUNTER — Ambulatory Visit: Payer: Medicare HMO | Attending: Internal Medicine

## 2018-05-01 ENCOUNTER — Ambulatory Visit: Payer: Medicare HMO

## 2018-05-01 DIAGNOSIS — M5442 Lumbago with sciatica, left side: Secondary | ICD-10-CM | POA: Diagnosis not present

## 2018-05-01 DIAGNOSIS — M6281 Muscle weakness (generalized): Secondary | ICD-10-CM | POA: Diagnosis not present

## 2018-05-01 DIAGNOSIS — R2689 Other abnormalities of gait and mobility: Secondary | ICD-10-CM | POA: Insufficient documentation

## 2018-05-01 DIAGNOSIS — G8929 Other chronic pain: Secondary | ICD-10-CM | POA: Diagnosis not present

## 2018-05-01 NOTE — Therapy (Signed)
[facility] [address] [place], [place], [address] Phone: [phone]   Fax:  [phone]  Physical Therapy Treatment  Patient Details  Name: Alyssa Ingram MRN: [id number] Date of Birth: 1945-06-10 Referring Provider (PT): Cathlean Cower, MD   Encounter Date: 05/01/2018  PT End of Session - 05/01/18 1310    Visit Number  3    Date for PT Re-Evaluation  06/26/18    Authorization Type  Medicare    PT Start Time  1222    PT Stop Time  1312    PT Time Calculation (min)  50 min    Activity Tolerance  Patient tolerated treatment well    Behavior During Therapy  [facility] for tasks assessed/performed       Past Medical History:  Diagnosis Date  . Abnormal MRI, spine 11/2011   ?discitis L5-S1, s/p CT bx 12/12/11  . Allergic rhinitis, cause unspecified 01/31/2013  . Arthritis   . Asthma   . Cervical cancer ([place])   . Chronic diastolic heart failure ([place])   . Diabetic retinopathy ([place])   . GERD (gastroesophageal reflux disease)   . H/O cardiovascular stress test    Nuclear study in 2011 normal perfusion  . HOCM (hypertrophic obstructive cardiomyopathy) ([place])    Echo 12/19/11: Severe LVH, EF 55-65%, dynamic obstruction, wall motion normal, grade 1 diastolic dysfunction, systolic anterior motion of the mitral valve with mild MS and mild MR, mild LAE, PASP 34  . Hyperlipidemia   . Hypertension   . Kidney stones   . Obesity   . Type II or unspecified type diabetes mellitus without mention of complication, uncontrolled 01/31/2013    Past Surgical History:  Procedure Laterality Date  . ABDOMINAL HYSTERECTOMY    . JOINT REPLACEMENT     right knee 2006  . rotater cuff     right, 2005    There were no vitals filed for this visit.  Subjective Assessment - 05/01/18 1226    Subjective  I have my new orthopedic shoes now.      Diagnostic tests  MRI:   grade 1 spondylosisthesis L4-5 and L5-S1, anterolisthesis L4 on L5 and L5 on  S1, spinal stenosis and facet arthosis.    Patient Stated Goals  reduce LBP- not able to describe a particular activity    Pain Location  Leg    Pain Orientation  Left    Pain Descriptors / Indicators  Tightness    Pain Onset  More than a month ago    Pain Frequency  Intermittent    Aggravating Factors   standing and walking    Pain Relieving Factors  pain medication, rest         [facility] PT Assessment - 05/01/18 0001      Assessment   Medical Diagnosis  Low back pain with sciatica    Onset Date/Surgical Date  03/07/17      Precautions   Precautions  [place] residence    Living Arrangements  Other relatives      Prior Function   Level of Independence  Independent with basic ADLs    Vocation  Retired    Leisure  crossword puzzles, reading, TV, active in church      Observation/Other Assessments   Focus on Therapeutic Outcomes (FOTO)   51% limitation      Ambulation/Gait   Ambulation/Gait  Yes    Ambulation/Gait Assistance  6: Modified independent (Device/Increase time)    Ambulation Distance (Feet)  80 Feet    Assistive device  Rolling walker    Gait Pattern  Step-through pattern   small step through   Curb  3: Mod assist   with cane and walker +1 son                  [facility] Adult PT Treatment/Exercise - 05/01/18 0001      Transfers   Transfers  Sit to Stand;Stand to Sit    Sit to Stand  6: Modified independent (Device/Increase time);Without upper extremity assist    Stand to Sit  6: Modified independent (Device/Increase time);With upper extremity assist      Ambulation/Gait   Gait Comments  ambulation on level surface with rolling walker x 3 minutes      Posture/Postural Control   Posture/Postural Control  Postural limitations    Postural Limitations  Flexed trunk;Forward head;Rounded Shoulders      Exercises   Exercises  Shoulder;Lumbar;Knee/Hip;Ankle      Knee/Hip Exercises: Aerobic   Nustep  L1  x 5 min   PTA assist pt on/off     Knee/Hip Exercises: Seated   Long Arc Quad  AROM;Strengthening;Right;Left;2 sets;5 reps   hold x 5 seconds   Marching  Strengthening;Both;1 set;10 reps      Shoulder Exercises: Seated   Flexion  Strengthening;Both;20 reps    Other Seated Exercises  biceps curls 2x10             PT Education - 05/01/18 1306    Education Details  Access Code: [email or web address]    Person(s) Educated  Patient;Child(ren)    Methods  Explanation;Demonstration;Handout    Comprehension  Verbalized understanding;Returned demonstration       PT Short Term Goals - 03/07/18 1336      PT SHORT TERM GOAL #1   Title  be independent in initial HEP    Time  4    Status  New    Target Date  04/04/18      PT SHORT TERM GOAL #2   Title  report regular episodes of exercise throughout the day to reduce long stretches in the recliner    Time  4    Period  Weeks    Status  New    Target Date  04/04/18      PT SHORT TERM GOAL #3   Title  report a 25% reduction in LBP with daily tasks    Time  4    Period  Weeks    Status  New    Target Date  04/04/18        PT Long Term Goals - 05/01/18 1240      PT LONG TERM GOAL #1   Title  be independent in advanced HEP    Time  8    Period  Weeks    Status  On-going    Target Date  06/26/18      PT LONG TERM GOAL #2   Title  reduce FOTO to < or = to 40% limitation    Time  8    Period  Weeks    Status  On-going    Target Date  06/26/18      PT LONG TERM GOAL #3   Title  report a 50% reduction in LBP with daily tasks    Baseline  >50% reduction in LBP    Status  Achieved  PT LONG TERM GOAL #4   Title  improve LE strength to ambulate from front lobby to treatment room (~50 ft) in < or = to 1.5  minutes    Time  8    Period  Weeks    Status  On-going            Plan - 05/01/18 1246    Clinical Impression Statement  Pt has only attended 2 visits since the start of care.  Pt has had problem with  transportation.  Pt with a complex medical history that limits her mobility.  Pt has new orthopedic shoes that have helped with her foot pain.  Pt demonstrated improved A/ROM of the LEs with seated exercise.  Pt denies any LBP at this time.  Mobility remains slow and pt will continue to benefit from skilled PT to improve independence to allow for community function and to go to the [facility] regularly for exercise and socialization.      PT Frequency  1x / week    PT Duration  8 weeks    PT Treatment/Interventions  ADLs/Self Care Home Management;Cryotherapy;Electrical Stimulation;Moist Heat;Ultrasound;Therapeutic exercise;Therapeutic activities;Functional mobility training;Gait training;Stair training;Neuromuscular re-education;Patient/family education;Passive range of motion;Manual techniques;Taping    PT Next Visit Plan  work on [place] and core strength, gait    PT Home Exercise Plan   Access Code: [email or web address]     Recommended Other Services  initial cert signed, recert sent 07/37/10    Consulted and Agree with Plan of Care  Patient;Family member/caregiver    Family Member Consulted  pt's son       Patient will benefit from skilled therapeutic intervention in order to improve the following deficits and impairments:  Impaired flexibility, Decreased safety awareness, Decreased activity tolerance, Decreased endurance, Decreased range of motion, Decreased strength, Postural dysfunction, Decreased mobility, Abnormal gait, Improper body mechanics, Pain  Visit Diagnosis: Chronic bilateral low back pain with left-sided sciatica - Plan: PT plan of care cert/re-cert  Muscle weakness (generalized) - Plan: PT plan of care cert/re-cert  Other abnormalities of gait and mobility - Plan: PT plan of care cert/re-cert     Problem List Patient Active Problem List   Diagnosis Date Noted  . Greater trochanteric bursitis of left hip 01/30/2018  . Low back pain 06/21/2017  . Lateral epicondylitis of  right elbow 06/20/2017  . HLD (hyperlipidemia) 08/24/2016  . Right shoulder pain 08/24/2016  . Debility 08/10/2016  . Viral illness 06/13/2016  . Headache 12/09/2015  . Degenerative arthritis of left knee 11/23/2015  . Acute renal failure syndrome ([place]) 02/04/2015  . Bilateral foot pain 01/26/2015  . History of knee replacement procedure of right knee 08/06/2014  . Arthritis of left lower extremity 08/06/2014  . Cellulitis of leg, left 04/14/2014  . Acute bronchitis 04/14/2014  . Chronic venous insufficiency 04/14/2014  . Left leg pain 03/12/2014  . Patellar tendonitis of left knee 07/31/2013  . Superficial phlebitis 01/31/2013  . Chronic pain of right knee 01/31/2013  . Diabetes ([place]) 01/31/2013  . Allergic rhinitis 01/31/2013  . Hypertrophic obstructive cardiomyopathy ([place]) 05/03/2012  . Left lumbar radiculopathy 11/22/2011  . Right knee pain 09/13/2011  . Cough 09/13/2011  . Arthritis   . Mild persistent asthma   . Cervical cancer ([place])   . GERD (gastroesophageal reflux disease)   . Hypertension   . Kidney stones   . CHF (congestive heart failure) ([place])   . Encounter for well adult exam with abnormal findings 09/10/2011  Sigurd Sos, PT 05/01/18 1:13 PM   [facility] [address], [place], [place], [address] Phone: [phone]   Fax:  [phone]  Name: Alyssa Ingram MRN: [id number] Date of Birth: 11/05/1945

## 2018-05-01 NOTE — Patient Instructions (Signed)
Access Code: [email or web address]  URL: [email or web address]  Date: 05/01/2018  Prepared by: Sigurd Sos   Exercises Seated Hip Adduction Isometrics with Diona Foley - 10 reps - 2 sets - 5 hold - 3x daily - 7x weekly Seated Shoulder Flexion - 10 reps - 2 sets - 2x daily - 7x weekly Seated Biceps Curl - 10 reps - 2 sets - 2x daily - 7x weekly

## 2018-05-12 ENCOUNTER — Other Ambulatory Visit: Payer: Self-pay | Admitting: Internal Medicine

## 2018-05-16 ENCOUNTER — Other Ambulatory Visit: Payer: Self-pay | Admitting: Podiatry

## 2018-05-22 ENCOUNTER — Encounter: Payer: Self-pay | Admitting: Physical Therapy

## 2018-05-22 ENCOUNTER — Ambulatory Visit: Payer: Medicare HMO | Attending: Internal Medicine

## 2018-05-22 ENCOUNTER — Ambulatory Visit: Payer: Medicare HMO | Admitting: Physical Therapy

## 2018-05-22 DIAGNOSIS — R2689 Other abnormalities of gait and mobility: Secondary | ICD-10-CM | POA: Insufficient documentation

## 2018-05-22 DIAGNOSIS — M6281 Muscle weakness (generalized): Secondary | ICD-10-CM | POA: Insufficient documentation

## 2018-05-22 DIAGNOSIS — M5442 Lumbago with sciatica, left side: Principal | ICD-10-CM

## 2018-05-22 DIAGNOSIS — G8929 Other chronic pain: Secondary | ICD-10-CM | POA: Diagnosis present

## 2018-05-22 NOTE — Progress Notes (Signed)
Patient came in today to pick up CUSTOMdiabetic shoes and custom inserts.  Same was well pleased with fit and function.   The patient could ambulate without any discomfort; there were no signs of any quality issues. The foot ortheses offered full contact with plantar surface and contoured the arch well.   The shoes fit well with no heel slippage and areas of pressure concern.   Patient advised to contact us if any problems arise.  Patient also advised on how to report any issues. 

## 2018-05-22 NOTE — Therapy (Signed)
[facility] [address] [place], [place], [address] Phone: [phone]   Fax:  [phone]  Physical Therapy Treatment  Patient Details  Name: Alyssa Ingram MRN: [id number] Date of Birth: 02-04-1946 Referring Provider (PT): Cathlean Cower, MD   Encounter Date: 05/22/2018  PT End of Session - 05/22/18 1620    Visit Number  4    Date for PT Re-Evaluation  06/26/18    Authorization Type  Medicare    PT Start Time  1525    PT Stop Time  1610    PT Time Calculation (min)  45 min    Activity Tolerance  Patient tolerated treatment well    Behavior During Therapy  [facility] for tasks assessed/performed       Past Medical History:  Diagnosis Date  . Abnormal MRI, spine 11/2011   ?discitis L5-S1, s/p CT bx 12/12/11  . Allergic rhinitis, cause unspecified 01/31/2013  . Arthritis   . Asthma   . Cervical cancer ([place])   . Chronic diastolic heart failure ([place])   . Diabetic retinopathy ([place])   . GERD (gastroesophageal reflux disease)   . H/O cardiovascular stress test    Nuclear study in 2011 normal perfusion  . HOCM (hypertrophic obstructive cardiomyopathy) ([place])    Echo 12/19/11: Severe LVH, EF 55-65%, dynamic obstruction, wall motion normal, grade 1 diastolic dysfunction, systolic anterior motion of the mitral valve with mild MS and mild MR, mild LAE, PASP 34  . Hyperlipidemia   . Hypertension   . Kidney stones   . Obesity   . Type II or unspecified type diabetes mellitus without mention of complication, uncontrolled 01/31/2013    Past Surgical History:  Procedure Laterality Date  . ABDOMINAL HYSTERECTOMY    . JOINT REPLACEMENT     right knee 2006  . rotater cuff     right, 2005    There were no vitals filed for this visit.  Subjective Assessment - 05/22/18 1523    Subjective  I am picking up my walker today. I felt good after last visit. Today my left knee and leg is bothering me. I have trouble getting into bed more.      Patient is accompained by:  Family member   son   Pertinent History  Rt TKA 2006, injections in Lt knee, hip and shoulder regularly    How long can you sit comfortably?  no limitation    Diagnostic tests  MRI:   grade 1 spondylosisthesis L4-5 and L5-S1, anterolisthesis L4 on L5 and L5 on S1, spinal stenosis and facet arthosis.    Patient Stated Goals  reduce LBP- not able to describe a particular activity    Currently in Pain?  Yes    Pain Score  9     Pain Location  Leg    Pain Orientation  Left    Pain Descriptors / Indicators  Tightness    Pain Type  Chronic pain    Pain Radiating Towards  left lower extremity reaiculopathy    Pain Onset  More than a month ago    Aggravating Factors   standing and walking    Pain Relieving Factors  pain medication, rest    Multiple Pain Sites  No                       [facility] Adult PT Treatment/Exercise - 05/22/18 0001      Ambulation/Gait   Ambulation/Gait  Yes  Ambulation/Gait Assistance  6: Modified independent (Device/Increase time)    Ambulation Distance (Feet)  40 Feet   2 times   Assistive device  Rolling walker    Gait Pattern  Step-through pattern   small step through   Curb  3: Mod assist   with cane and walker +1 son     Knee/Hip Exercises: Aerobic   Nustep  L1 x 7 min   PT assist pt on/off; PT assessed patient     Knee/Hip Exercises: Standing   Hip Flexion  Stengthening;Right;Left;2 sets;10 reps;Knee bent   holding onto walker     Knee/Hip Exercises: Seated   Long Arc Quad  AROM;Strengthening;Right;Left;5 reps;3 sets   hold x 5 seconds   Marching  Strengthening;Both;1 set;10 reps    Abduction/Adduction   Both;2 sets;10 reps   yellow theraband   Sit to Sand  5 reps;with UE support;1 set   VC to use  LE > UE     Shoulder Exercises: Seated   Flexion  Strengthening;Both;10 reps   2 sets   Abduction  Strengthening;Right;Left;10 reps;Weights   to 90 degrees abduction   ABduction Weight (lbs)  1#     Other Seated Exercises  biceps curls 1x10 2#      Ankle Exercises: Seated   Toe Raise  20 reps;1 second             PT Education - 05/22/18 1615    Education Details  information on a bed bar to assist her getting into the bed.     Person(s) Educated  Patient;Child(ren)    Methods  Explanation;Handout    Comprehension  Verbalized understanding       PT Short Term Goals - 03/07/18 1336      PT SHORT TERM GOAL #1   Title  be independent in initial HEP    Time  4    Status  New    Target Date  04/04/18      PT SHORT TERM GOAL #2   Title  report regular episodes of exercise throughout the day to reduce long stretches in the recliner    Time  4    Period  Weeks    Status  New    Target Date  04/04/18      PT SHORT TERM GOAL #3   Title  report a 25% reduction in LBP with daily tasks    Time  4    Period  Weeks    Status  New    Target Date  04/04/18        PT Long Term Goals - 05/01/18 1240      PT LONG TERM GOAL #1   Title  be independent in advanced HEP    Time  8    Period  Weeks    Status  On-going    Target Date  06/26/18      PT LONG TERM GOAL #2   Title  reduce FOTO to < or = to 40% limitation    Time  8    Period  Weeks    Status  On-going    Target Date  06/26/18      PT LONG TERM GOAL #3   Title  report a 50% reduction in LBP with daily tasks    Baseline  >50% reduction in LBP    Status  Achieved      PT LONG TERM GOAL #4   Title  improve LE strength to ambulate from  front lobby to treatment room (~50 ft) in < or = to 1.5  minutes    Time  8    Period  Weeks    Status  On-going            Plan - 05/22/18 1621    Clinical Impression Statement  Patient was having trouble with her asthma prior to coming and had to take medication. Patient worked hard, Patient needed reminders she is to breath with exercise. Patient continues to need 2 people to help her on and off the nustep. Patient is able to push up with her hands to stand. Patient  has trouble getting in and out of bed so suggested a bed bar. Patient will benefit from skilled therapy to improve independence to allow for community function and to go to the [facility] regularly for exercise and socialization.     Rehab Potential  Good    Clinical Impairments Affecting Rehab Potential  Lt LE edema, limited functional mobility, multiple comorbidities    PT Frequency  1x / week    PT Duration  8 weeks    PT Treatment/Interventions  ADLs/Self Care Home Management;Cryotherapy;Electrical Stimulation;Moist Heat;Ultrasound;Therapeutic exercise;Therapeutic activities;Functional mobility training;Gait training;Stair training;Neuromuscular re-education;Patient/family education;Passive range of motion;Manual techniques;Taping    PT Next Visit Plan  work on UE/LE mobiltiy and core strength, gait    PT Home Exercise Plan   Access Code: [email or web address]     Consulted and Agree with Plan of Care  Patient;Family member/caregiver    Family Member Consulted  pt's son       Patient will benefit from skilled therapeutic intervention in order to improve the following deficits and impairments:  Impaired flexibility, Decreased safety awareness, Decreased activity tolerance, Decreased endurance, Decreased range of motion, Decreased strength, Postural dysfunction, Decreased mobility, Abnormal gait, Improper body mechanics, Pain  Visit Diagnosis: Chronic bilateral low back pain with left-sided sciatica  Muscle weakness (generalized)  Other abnormalities of gait and mobility     Problem List Patient Active Problem List   Diagnosis Date Noted  . Greater trochanteric bursitis of left hip 01/30/2018  . Low back pain 06/21/2017  . Lateral epicondylitis of right elbow 06/20/2017  . HLD (hyperlipidemia) 08/24/2016  . Right shoulder pain 08/24/2016  . Debility 08/10/2016  . Viral illness 06/13/2016  . Headache 12/09/2015  . Degenerative arthritis of left knee 11/23/2015  . Acute renal failure  syndrome ([place]) 02/04/2015  . Bilateral foot pain 01/26/2015  . History of knee replacement procedure of right knee 08/06/2014  . Arthritis of left lower extremity 08/06/2014  . Cellulitis of leg, left 04/14/2014  . Acute bronchitis 04/14/2014  . Chronic venous insufficiency 04/14/2014  . Left leg pain 03/12/2014  . Patellar tendonitis of left knee 07/31/2013  . Superficial phlebitis 01/31/2013  . Chronic pain of right knee 01/31/2013  . Diabetes ([place]) 01/31/2013  . Allergic rhinitis 01/31/2013  . Hypertrophic obstructive cardiomyopathy ([place]) 05/03/2012  . Left lumbar radiculopathy 11/22/2011  . Right knee pain 09/13/2011  . Cough 09/13/2011  . Arthritis   . Mild persistent asthma   . Cervical cancer ([place])   . GERD (gastroesophageal reflux disease)   . Hypertension   . Kidney stones   . CHF (congestive heart failure) ([place])   . Encounter for well adult exam with abnormal findings 09/10/2011    Earlie Counts, PT 05/22/18 4:24 PM    [facility] [address], [place], [place], [address] Phone: [phone]   Fax:  [phone]  Name: Alyssa Ingram MRN: [id number] Date of Birth: 05/02/1946

## 2018-05-22 NOTE — Patient Instructions (Addendum)
   [facility] [address], [address] [place], [place] [address] Phone # [phone] Fax [phone]  

## 2018-05-28 ENCOUNTER — Other Ambulatory Visit: Payer: Medicare HMO | Admitting: Orthotics

## 2018-05-29 ENCOUNTER — Ambulatory Visit: Payer: Medicare HMO | Admitting: Physical Therapy

## 2018-06-05 ENCOUNTER — Ambulatory Visit: Payer: Medicare HMO | Admitting: Physical Therapy

## 2018-06-05 ENCOUNTER — Encounter: Payer: Self-pay | Admitting: Physical Therapy

## 2018-06-05 DIAGNOSIS — M5442 Lumbago with sciatica, left side: Principal | ICD-10-CM

## 2018-06-05 DIAGNOSIS — G8929 Other chronic pain: Secondary | ICD-10-CM

## 2018-06-05 DIAGNOSIS — M6281 Muscle weakness (generalized): Secondary | ICD-10-CM

## 2018-06-05 DIAGNOSIS — R2689 Other abnormalities of gait and mobility: Secondary | ICD-10-CM

## 2018-06-05 NOTE — Therapy (Addendum)
[facility] [address], [place], [place], [address] Phone: [phone]   Fax:  [phone]  Physical Therapy Treatment  Patient Details  Name: Alyssa Ingram MRN: [id number] Date of Birth: 07-05-1945 Referring Provider (PT): Cathlean Cower, MD   Encounter Date: 06/05/2018  PT End of Session - 06/05/18 1518    Visit Number  5    Date for PT Re-Evaluation  06/26/18    Authorization Type  Medicare    PT Start Time  1510   Pt 25 min late   PT Stop Time  1550    PT Time Calculation (min)  40 min    Activity Tolerance  Treatment limited secondary to medical complications (Comment)   Mobility poor, cannot bend LT knee very well   Behavior During Therapy  [facility] for tasks assessed/performed       Past Medical History:  Diagnosis Date  . Abnormal MRI, spine 11/2011   ?discitis L5-S1, s/p CT bx 12/12/11  . Allergic rhinitis, cause unspecified 01/31/2013  . Arthritis   . Asthma   . Cervical cancer ([place])   . Chronic diastolic heart failure ([place])   . Diabetic retinopathy ([place])   . GERD (gastroesophageal reflux disease)   . H/O cardiovascular stress test    Nuclear study in 2011 normal perfusion  . HOCM (hypertrophic obstructive cardiomyopathy) ([place])    Echo 12/19/11: Severe LVH, EF 55-65%, dynamic obstruction, wall motion normal, grade 1 diastolic dysfunction, systolic anterior motion of the mitral valve with mild MS and mild MR, mild LAE, PASP 34  . Hyperlipidemia   . Hypertension   . Kidney stones   . Obesity   . Type II or unspecified type diabetes mellitus without mention of complication, uncontrolled 01/31/2013    Past Surgical History:  Procedure Laterality Date  . ABDOMINAL HYSTERECTOMY    . JOINT REPLACEMENT     right knee 2006  . rotater cuff     right, 2005    There were no vitals filed for this visit.  Subjective Assessment - 06/05/18 1520    Subjective  Pt arrived to therapy with her new rolling walker.      Diagnostic tests  MRI:   grade 1 spondylosisthesis L4-5 and L5-S1, anterolisthesis L4 on L5 and L5 on S1, spinal stenosis and facet arthosis.    Currently in Pain?  Yes   Lt knee hurts like typical   Pain Score  5     Pain Location  Knee    Pain Orientation  Left    Pain Descriptors / Indicators  Tightness;Sore    Aggravating Factors   bending the knee too much, too much standing & walking    Pain Relieving Factors  meds                       [facility] Adult PT Treatment/Exercise - 06/05/18 0001      Ambulation/Gait   Ambulation/Gait  Yes    Ambulation/Gait Assistance  6: Modified independent (Device/Increase time)    Ambulation Distance (Feet)  80 Feet   3x   Assistive device  Rolling walker    Gait Pattern  Step-through pattern   small step through   Gait Comments  VC for sit to stand to RW and vice versa.       Knee/Hip Exercises: Aerobic   Nustep  L1 x 5 min   Took 5 min to get pt into the chair  PT Education - 06/05/18 1551    Education Details  Home walking program with new rolling walker ( in her home): parameters are to walk as often as she can 25-50 feet.     Person(s) Educated  Patient;Child(ren)    Methods  Explanation;Verbal cues    Comprehension  Verbalized understanding       PT Short Term Goals - 03/07/18 1336      PT SHORT TERM GOAL #1   Title  be independent in initial HEP    Time  4    Status  New    Target Date  04/04/18      PT SHORT TERM GOAL #2   Title  report regular episodes of exercise throughout the day to reduce long stretches in the recliner    Time  4    Period  Weeks    Status  New    Target Date  04/04/18      PT SHORT TERM GOAL #3   Title  report a 25% reduction in LBP with daily tasks    Time  4    Period  Weeks    Status  New    Target Date  04/04/18        PT Long Term Goals - 05/01/18 1240      PT LONG TERM GOAL #1   Title  be independent in advanced HEP    Time  8    Period  Weeks     Status  On-going    Target Date  06/26/18      PT LONG TERM GOAL #2   Title  reduce FOTO to < or = to 40% limitation    Time  8    Period  Weeks    Status  On-going    Target Date  06/26/18      PT LONG TERM GOAL #3   Title  report a 50% reduction in LBP with daily tasks    Baseline  >50% reduction in LBP    Status  Achieved      PT LONG TERM GOAL #4   Title  improve LE strength to ambulate from front lobby to treatment room (~50 ft) in < or = to 1.5  minutes    Time  8    Period  Weeks    Status  On-going            Plan - 06/05/18 1519    Clinical Impression Statement  Pt arrives 25 min late for her session today. She purchased her rolling walker and brings it with her today. She demonstrates faster gait speed and  fair step lengths. ( she has pretty short legs so her steps are reasonable for her).  We worked on establishing a home walking program with son's input.  She was able to walk 80 feet 3x.  We worked on safety when sitting ans standing from the walker. This got better with repetition.     Rehab Potential  Good    Clinical Impairments Affecting Rehab Potential  Lt LE edema, limited functional mobility, multiple comorbidities    PT Frequency  1x / week    PT Duration  8 weeks    PT Treatment/Interventions  ADLs/Self Care Home Management;Cryotherapy;Electrical Stimulation;Moist Heat;Ultrasound;Therapeutic exercise;Therapeutic activities;Functional mobility training;Gait training;Stair training;Neuromuscular re-education;Patient/family education;Passive range of motion;Manual techniques;Taping    PT Next Visit Plan  Consider walking outdoors next    PT Home Exercise Plan   Access Code: [email or web address]  Consulted and Agree with Plan of Care  Patient;Family member/caregiver    Family Member Consulted  pt's son       Patient will benefit from skilled therapeutic intervention in order to improve the following deficits and impairments:  Impaired flexibility, Decreased  safety awareness, Decreased activity tolerance, Decreased endurance, Decreased range of motion, Decreased strength, Postural dysfunction, Decreased mobility, Abnormal gait, Improper body mechanics, Pain  Visit Diagnosis: Chronic bilateral low back pain with left-sided sciatica  Muscle weakness (generalized)  Other abnormalities of gait and mobility     Problem List Patient Active Problem List   Diagnosis Date Noted  . Greater trochanteric bursitis of left hip 01/30/2018  . Low back pain 06/21/2017  . Lateral epicondylitis of right elbow 06/20/2017  . HLD (hyperlipidemia) 08/24/2016  . Right shoulder pain 08/24/2016  . Debility 08/10/2016  . Viral illness 06/13/2016  . Headache 12/09/2015  . Degenerative arthritis of left knee 11/23/2015  . Acute renal failure syndrome ([place]) 02/04/2015  . Bilateral foot pain 01/26/2015  . History of knee replacement procedure of right knee 08/06/2014  . Arthritis of left lower extremity 08/06/2014  . Cellulitis of leg, left 04/14/2014  . Acute bronchitis 04/14/2014  . Chronic venous insufficiency 04/14/2014  . Left leg pain 03/12/2014  . Patellar tendonitis of left knee 07/31/2013  . Superficial phlebitis 01/31/2013  . Chronic pain of right knee 01/31/2013  . Diabetes ([place]) 01/31/2013  . Allergic rhinitis 01/31/2013  . Hypertrophic obstructive cardiomyopathy ([place]) 05/03/2012  . Left lumbar radiculopathy 11/22/2011  . Right knee pain 09/13/2011  . Cough 09/13/2011  . Arthritis   . Mild persistent asthma   . Cervical cancer ([place])   . GERD (gastroesophageal reflux disease)   . Hypertension   . Kidney stones   . CHF (congestive heart failure) ([place])   . Encounter for well adult exam with abnormal findings 09/10/2011    Lilliana Turner, PTA 06/05/2018, 4:02 PM PHYSICAL THERAPY DISCHARGE SUMMARY  Visits from Start of Care: 5  Current functional level related to goals / functional outcomes: Pt attended 5 PT sessions with inconsistent  attendance for strength and gait.  Pt has HEP and was advised to walk with her rolling walker for exercise.     Remaining deficits: See above for current status.  Pt had a hardship getting to PT appointments due to transportation and slow mobility.     Education / Equipment: HEP Plan: Patient agrees to discharge.  Patient goals were not met. Patient is being discharged due to not returning since the last visit.  ?????        Sigurd Sos, PT 07/03/18 2:29 PM  [facility] [address] [place], [place], [address] Phone: [phone]   Fax:  [phone]  Name: ANJALI MANZELLA MRN: [id number] Date of Birth: April 29, 1946

## 2018-06-06 ENCOUNTER — Ambulatory Visit: Payer: Medicare HMO | Admitting: Orthotics

## 2018-06-06 DIAGNOSIS — M79674 Pain in right toe(s): Secondary | ICD-10-CM

## 2018-06-06 DIAGNOSIS — M79675 Pain in left toe(s): Secondary | ICD-10-CM

## 2018-06-06 DIAGNOSIS — E1149 Type 2 diabetes mellitus with other diabetic neurological complication: Secondary | ICD-10-CM

## 2018-06-06 DIAGNOSIS — M2141 Flat foot [pes planus] (acquired), right foot: Secondary | ICD-10-CM

## 2018-06-06 DIAGNOSIS — M2142 Flat foot [pes planus] (acquired), left foot: Secondary | ICD-10-CM

## 2018-06-06 NOTE — Progress Notes (Signed)
Patient was measured for med necessity CUSTOMshoes (x2) w/ 2pair) custom f/o. Patient was measured w/ brannock to determine size and width.  Foam impression mold was achieved and deemed appropriate for fabrication of  cmfo.   See DPM chart notes for further documentation and dx codes for determination of medical necessity.  Appropriate forms will be sent to PCP to verify and sign off on medical necessity.

## 2018-06-10 NOTE — Progress Notes (Deleted)
HPI: FU HCM. Patient previously lived in [place]. Records from there include: Nuclear study in November of 2011 showed an ejection fraction of 62% and normal perfusion. Exercise treadmill February 2014 showed poor exercise capacity and study was technically difficult. However no hypotension noted. Carotid Dopplers January 2014 showed no significant carotid disease. 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 Echo May 2018 showed vigorous LV systolic function, severe asymmetric septal hypertrophy, left ventricular outflow tract gradient of 17 mmHg, grade 1 diastolic dysfunction, mild mitral stenosis, mild mitral regurgitation or moderate to severe left atrial enlargement.  Since last seen,   Current Outpatient Medications  Medication Sig Dispense Refill  . albuterol (ACCUNEB) 0.63 MG/3ML nebulizer solution INHALE 1 VIAL BY MOUTH VIA NEBULIZATION EVERY 6 HOURS AS NEEDED FOR WHEEZING. 75 mL 12  . albuterol (PROAIR HFA) 108 (90 Base) MCG/ACT inhaler Inhale 2 puffs into the lungs 2 (two) times daily. 8.5 Inhaler 2  . albuterol (VENTOLIN HFA) 108 (90 Base) MCG/ACT inhaler Inhale 2 puffs into the lungs 2 (two) times daily. 18 Inhaler 2  . allopurinol (ZYLOPRIM) 100 MG tablet Take 1 tablet (100 mg total) by mouth daily. 90 tablet 3  . ammonium lactate (AMLACTIN) 12 % cream APPLY TOPICALLY AS NEEDED FOR DRY SKIN. 385 g 0  . aspirin 81 MG tablet Take 162 mg by mouth daily.     Marland Kitchen atorvastatin (LIPITOR) 40 MG tablet Take 1 tablet (40 mg total) by mouth daily. 90 tablet 3  . Blood Glucose Monitoring Suppl (ACCU-CHEK AVIVA PLUS) w/Device KIT Use to check blood sugar three times daily 1 kit 2  . cetirizine (ZYRTEC) 10 MG tablet TAKE 1 TABLET (10 MG TOTAL) BY MOUTH DAILY as needed 90 tablet 3  . Continuous Blood Gluc Receiver (FREESTYLE LIBRE READER) DEVI Apply 1 Device topically 4 (four) times daily as needed. E11.9 1 Device 0  . Continuous Blood Gluc Sensor (FREESTYLE LIBRE 14 DAY  SENSOR) MISC Apply 1 Device topically every 14 (fourteen) days. E11.9 6 each 3  . diltiazem (CARDIZEM CD) 180 MG 24 hr capsule TAKE 1 CAPSULE (180 MG TOTAL) BY MOUTH DAILY. 90 capsule 2  . furosemide (LASIX) 20 MG tablet TAKE 3 TABLETS (60 MG TOTAL) BY MOUTH 2 TIMES DAILY. 180 tablet 2  . gabapentin (NEURONTIN) 300 MG capsule TAKE 1 TO 2 CAPSULES BY MOUTH EVERY DAY AT BEDTIME **ANNUAL APPOINTMENT NEEDED FOR REFILLS** 180 capsule 0  . glipiZIDE (GLUCOTROL XL) 2.5 MG 24 hr tablet TAKE 1 TABLET BY MOUTH DAILY WITH BREAKFAST. 90 tablet 3  . glucose blood (ACCU-CHEK AVIVA PLUS) test strip Use to check blood sugar three times daily 300 each 3  . lidocaine (LIDODERM) 5 % Place 1 patch onto the skin daily. Remove & Discard patch within 12 hours or as directed by MD 40 patch 1  . meclizine (ANTIVERT) 25 MG tablet TAKE 1 TABLET (25 MG TOTAL) BY MOUTH 3 (THREE) TIMES DAILY. 60 tablet 2  . meloxicam (MOBIC) 15 MG tablet TAKE 1 TABLET (15 MG TOTAL) BY MOUTH DAILY AS NEEDED FOR PAIN. 90 tablet 1  . metoprolol tartrate (LOPRESSOR) 50 MG tablet TAKE 1 TABLET BY MOUTH TWICE A DAY 180 tablet 3  . ONETOUCH DELICA PLUS LANCETS MISC Apply 1 Device topically daily. Use to check lood sugar once daily E11.9 100 each 3  . pantoprazole (PROTONIX) 40 MG tablet Take 1 tablet (40 mg total) by mouth daily. 90 tablet 3  . tiZANidine (ZANAFLEX)  2 MG tablet Take 1 tablet (2 mg total) by mouth every 6 (six) hours as needed for muscle spasms. 40 tablet 1  . traMADol (ULTRAM) 50 MG tablet Take 1 tablet (50 mg total) by mouth every 6 (six) hours as needed. 30 tablet 0  . triamcinolone (NASACORT AQ) 55 MCG/ACT AERO nasal inhaler Place 2 sprays into the nose daily. 1 Inhaler 12   No current facility-administered medications for this visit.      Past Medical History:  Diagnosis Date  . Abnormal MRI, spine 11/2011   ?discitis L5-S1, s/p CT bx 12/12/11  . Allergic rhinitis, cause unspecified 01/31/2013  . Arthritis   . Asthma   .  Cervical cancer ([place])   . Chronic diastolic heart failure ([place])   . Diabetic retinopathy ([place])   . GERD (gastroesophageal reflux disease)   . H/O cardiovascular stress test    Nuclear study in 2011 normal perfusion  . HOCM (hypertrophic obstructive cardiomyopathy) ([place])    Echo 12/19/11: Severe LVH, EF 55-65%, dynamic obstruction, wall motion normal, grade 1 diastolic dysfunction, systolic anterior motion of the mitral valve with mild MS and mild MR, mild LAE, PASP 34  . Hyperlipidemia   . Hypertension   . Kidney stones   . Obesity   . Type II or unspecified type diabetes mellitus without mention of complication, uncontrolled 01/31/2013    Past Surgical History:  Procedure Laterality Date  . ABDOMINAL HYSTERECTOMY    . JOINT REPLACEMENT     right knee 2006  . rotater cuff     right, 2005    Social History   Socioeconomic History  . Marital status: Widowed    Spouse name: Not on file  . Number of children: 5  . Years of education: 89  . Highest education level: Not on file  Occupational History  . Occupation: Retired Mining engineer  . Financial resource strain: Not on file  . Food insecurity:    Worry: Not on file    Inability: Not on file  . Transportation needs:    Medical: Not on file    Non-medical: Not on file  Tobacco Use  . Smoking status: Never Smoker  . Smokeless tobacco: Never Used  Substance and Sexual Activity  . Alcohol use: No    Alcohol/week: 0.0 standard drinks  . Drug use: No  . Sexual activity: Not on file  Lifestyle  . Physical activity:    Days per week: Not on file    Minutes per session: Not on file  . Stress: Not on file  Relationships  . Social connections:    Talks on phone: Not on file    Gets together: Not on file    Attends religious service: Not on file    Active member of club or organization: Not on file    Attends meetings of clubs or organizations: Not on file    Relationship status: Not on file  . Intimate partner  violence:    Fear of current or ex partner: Not on file    Emotionally abused: Not on file    Physically abused: Not on file    Forced sexual activity: Not on file  Other Topics Concern  . Not on file  Social History Narrative  . Not on file    Family History  Problem Relation Age of Onset  . Heart disease Father   . Arthritis Other   . Heart disease Other   . Hypertension Other   .  Diabetes Other   . Alcohol abuse Other   . Arthritis Other   . Cancer Other        Lung Cancer  . Heart disease Other   . Hypertension Other   . Sudden death Other   . Kidney disease Other   . Mental illness Other   . Diabetes Other   . Colon cancer Neg Hx     ROS: no fevers or chills, productive cough, hemoptysis, dysphasia, odynophagia, melena, hematochezia, dysuria, hematuria, rash, seizure activity, orthopnea, PND, pedal edema, claudication. Remaining systems are negative.  Physical Exam: Well-developed well-nourished in no acute distress.  Skin is warm and dry.  HEENT is normal.  Neck is supple.  Chest is clear to auscultation with normal expansion.  Cardiovascular exam is regular rate and rhythm.  Abdominal exam nontender or distended. No masses palpated. Extremities show no edema. neuro grossly intact  ECG- personally reviewed  A/P  1  Kirk Ruths, MD

## 2018-06-12 ENCOUNTER — Ambulatory Visit: Payer: Medicare HMO | Admitting: Physical Therapy

## 2018-06-19 ENCOUNTER — Telehealth: Payer: Self-pay | Admitting: Internal Medicine

## 2018-06-19 ENCOUNTER — Ambulatory Visit: Payer: Managed Care, Other (non HMO)

## 2018-06-19 ENCOUNTER — Ambulatory Visit: Payer: Medicare HMO | Admitting: Cardiology

## 2018-06-19 ENCOUNTER — Other Ambulatory Visit: Payer: Self-pay | Admitting: Internal Medicine

## 2018-06-19 NOTE — Telephone Encounter (Signed)
Called patient to let her know that she has a prescription for the [facility] Reader at her pharmacy. She did not realize this.  Also called the pharmacy to make sure that she did not need another prescription for. Does not need a new prescription.  Notified her that she the diabetes supplies would be ready today after 5 pm and the cost quoted from the pharmacy.  Pt voiced understanding.

## 2018-06-19 NOTE — Telephone Encounter (Signed)
Copied from [place] [phone]. Topic: Quick Communication - Rx Refill/Question >> Jun 19, 2018 11:28 AM Virl Axe D wrote: Medication: Freestyle Libre Meter / Pt stated that she has a hard time drawing her blood and this meter will make it easier.   Has the patient contacted their pharmacy? Yes.   (Agent: If no, request that the patient contact the pharmacy for the refill.) (Agent: If yes, when and what did the pharmacy advise?)  Preferred Pharmacy (with phone number or street name): [id number] [address], [place] [phone] (Phone) [phone] (Fax)  Agent: Please be advised that RX refills may take up to 3 business days. We ask that you follow-up with your pharmacy.

## 2018-06-20 ENCOUNTER — Encounter: Payer: Self-pay | Admitting: *Deleted

## 2018-06-26 ENCOUNTER — Ambulatory Visit: Payer: Managed Care, Other (non HMO)

## 2018-07-05 ENCOUNTER — Other Ambulatory Visit: Payer: Self-pay | Admitting: Internal Medicine

## 2018-07-09 ENCOUNTER — Ambulatory Visit: Payer: Medicare Other | Admitting: Internal Medicine

## 2018-07-16 ENCOUNTER — Ambulatory Visit: Payer: Medicare Other | Admitting: Family Medicine

## 2018-07-16 ENCOUNTER — Other Ambulatory Visit: Payer: Self-pay | Admitting: Internal Medicine

## 2018-07-17 ENCOUNTER — Encounter: Payer: Self-pay | Admitting: Internal Medicine

## 2018-07-17 ENCOUNTER — Ambulatory Visit (INDEPENDENT_AMBULATORY_CARE_PROVIDER_SITE_OTHER): Payer: Medicare Other | Admitting: Internal Medicine

## 2018-07-17 ENCOUNTER — Other Ambulatory Visit (INDEPENDENT_AMBULATORY_CARE_PROVIDER_SITE_OTHER): Payer: Medicare Other

## 2018-07-17 VITALS — BP 124/82 | HR 61 | Temp 97.7°F | Ht 60.0 in

## 2018-07-17 DIAGNOSIS — E119 Type 2 diabetes mellitus without complications: Secondary | ICD-10-CM

## 2018-07-17 DIAGNOSIS — E785 Hyperlipidemia, unspecified: Secondary | ICD-10-CM

## 2018-07-17 DIAGNOSIS — R202 Paresthesia of skin: Secondary | ICD-10-CM

## 2018-07-17 DIAGNOSIS — Z23 Encounter for immunization: Secondary | ICD-10-CM | POA: Diagnosis not present

## 2018-07-17 DIAGNOSIS — J029 Acute pharyngitis, unspecified: Secondary | ICD-10-CM | POA: Diagnosis not present

## 2018-07-17 DIAGNOSIS — Z0001 Encounter for general adult medical examination with abnormal findings: Secondary | ICD-10-CM

## 2018-07-17 DIAGNOSIS — Z0000 Encounter for general adult medical examination without abnormal findings: Secondary | ICD-10-CM

## 2018-07-17 DIAGNOSIS — R400 Somnolence: Secondary | ICD-10-CM | POA: Insufficient documentation

## 2018-07-17 DIAGNOSIS — I10 Essential (primary) hypertension: Secondary | ICD-10-CM

## 2018-07-17 LAB — CBC WITH DIFFERENTIAL/PLATELET
BASOS PCT: 2.6 % (ref 0.0–3.0)
Basophils Absolute: 0.1 10*3/uL (ref 0.0–0.1)
Eosinophils Absolute: 0.2 10*3/uL (ref 0.0–0.7)
Eosinophils Relative: 4 % (ref 0.0–5.0)
HCT: 38.6 % (ref 36.0–46.0)
Hemoglobin: 12.5 g/dL (ref 12.0–15.0)
Lymphocytes Relative: 37.9 % (ref 12.0–46.0)
Lymphs Abs: 2.1 10*3/uL (ref 0.7–4.0)
MCHC: 32.3 g/dL (ref 30.0–36.0)
MCV: 83.8 fl (ref 78.0–100.0)
Monocytes Absolute: 0.6 10*3/uL (ref 0.1–1.0)
Monocytes Relative: 10.6 % (ref 3.0–12.0)
Neutro Abs: 2.4 10*3/uL (ref 1.4–7.7)
Neutrophils Relative %: 44.9 % (ref 43.0–77.0)
Platelets: 266 10*3/uL (ref 150.0–400.0)
RBC: 4.61 Mil/uL (ref 3.87–5.11)
RDW: 16.6 % — ABNORMAL HIGH (ref 11.5–15.5)
WBC: 5.4 10*3/uL (ref 4.0–10.5)

## 2018-07-17 LAB — HEPATIC FUNCTION PANEL
ALT: 7 U/L (ref 0–35)
AST: 9 U/L (ref 0–37)
Albumin: 4.3 g/dL (ref 3.5–5.2)
Alkaline Phosphatase: 77 U/L (ref 39–117)
Bilirubin, Direct: 0 mg/dL (ref 0.0–0.3)
Total Bilirubin: 0.4 mg/dL (ref 0.2–1.2)
Total Protein: 8.1 g/dL (ref 6.0–8.3)

## 2018-07-17 LAB — BASIC METABOLIC PANEL
BUN: 21 mg/dL (ref 6–23)
CO2: 31 mEq/L (ref 19–32)
Calcium: 9.9 mg/dL (ref 8.4–10.5)
Chloride: 102 mEq/L (ref 96–112)
Creatinine, Ser: 0.83 mg/dL (ref 0.40–1.20)
GFR: 67.41 mL/min (ref >60.00)
GLUCOSE: 89 mg/dL (ref 70–99)
Potassium: 3.9 mEq/L (ref 3.5–5.1)
Sodium: 140 mEq/L (ref 135–145)

## 2018-07-17 LAB — VITAMIN B12: Vitamin B-12: 1064 pg/mL — ABNORMAL HIGH (ref 211–911)

## 2018-07-17 LAB — HEMOGLOBIN A1C: Hgb A1c MFr Bld: 6.1 % (ref 4.6–6.5)

## 2018-07-17 LAB — LIPID PANEL
Cholesterol: 208 mg/dL — ABNORMAL HIGH (ref 0–200)
HDL: 50.4 mg/dL (ref >39.00)
LDL Cholesterol: 128 mg/dL — ABNORMAL HIGH (ref 0–99)
NonHDL: 157.55
Total CHOL/HDL Ratio: 4
Triglycerides: 147 mg/dL (ref 0.0–149.0)
VLDL: 29.4 mg/dL (ref 0.0–40.0)

## 2018-07-17 LAB — TSH: TSH: 0.38 u[IU]/mL (ref 0.35–4.50)

## 2018-07-17 MED ORDER — AZITHROMYCIN 250 MG PO TABS: ORAL_TABLET | ORAL | 1 refills | Status: DC

## 2018-07-17 NOTE — Assessment & Plan Note (Signed)
stable overall by history and exam, recent data reviewed with pt, and pt to continue medical treatment as before,  to f/u any worsening symptoms or concerns  

## 2018-07-17 NOTE — Assessment & Plan Note (Signed)
For b12 level with labs 

## 2018-07-17 NOTE — Assessment & Plan Note (Signed)
For lipids with labs, consider statin

## 2018-07-17 NOTE — Assessment & Plan Note (Signed)

## 2018-07-17 NOTE — Assessment & Plan Note (Signed)
Mild to mod, for antibx course,  to f/u any worsening symptoms or concerns  In addition to the time spent performing CPE, I spent an additional 25 minutes face to face,in which greater than 50% of this time was spent in counseling and coordination of care for patient's acute illness as documented, including the differential dx, treatment, further evaluation and other management of acute pharyngitis, DM, HLD, paresthesias, and daytime somnolence

## 2018-07-17 NOTE — Patient Instructions (Addendum)
You had the Tdap Tetanus shot today  You will be contacted regarding the referral for: mammogram, pulmonary, and eye doctor  Please take all new medication as prescribed - the antibiotic  Please continue all other medications as before, and refills have been done if requested.  Please have the pharmacy call with any other refills you may need.  Please continue your efforts at being more active, low cholesterol diet, and weight control.  You are otherwise up to date with prevention measures today.  Please keep your appointments with your specialists as you may have planned  Please go to the LAB in the Basement (turn left off the elevator) for the tests to be done today  You will be contacted by phone if any changes need to be made immediately.  Otherwise, you will receive a letter about your results with an explanation, but please check with MyChart first.  Please remember to sign up for MyChart if you have not done so, as this will be important to you in the future with finding out test results, communicating by private email, and scheduling acute appointments online when needed.  Please return in 6 months, or sooner if needed

## 2018-07-17 NOTE — Progress Notes (Signed)
Subjective:    Patient ID: Alyssa Ingram, female    DOB: 1945-11-28, 73 y.o.   MRN: [id number]  HPI  Here for wellness and f/u with son with numerous issues;  Overall doing ok;  Pt denies Chest pain, worsening SOB, DOE, wheezing, orthopnea, PND, worsening LE edema, palpitations, dizziness or syncope..  Pt denies polydipsia, polyuria, or low sugar symptoms. Pt states overall good compliance with treatment and medications, good tolerability, and has been trying to follow appropriate diet.  Pt denies worsening depressive symptoms, suicidal ideation or panic. No fever, night sweats, wt loss, loss of appetite, or other constitutional symptoms.  Pt states fair ability with ADL's, has mod fall risk but less now that she is using the walker for the last 2 months, home safety reviewed and adequate, no other significant changes in hearing or vision, and only occasionally active with exercise.  Here with 2-3 days acute onset fever, severe ST pain, pressure, headache, general weakness and malaise, with mild non prod cough.  Pt denies new neurological symptoms such as new headache, or facial or extremity weakness or numbness except now with several months of tingling to all toes.  Also c/o worsening daytime somnolence, not clear why, seems to snore at night, lives with son with her but no definite disorderd breathing, just cant seem to stay awake, nearly all family have sleep apnea, she has gained wt and asking for evaluation Past Medical History:  Diagnosis Date  . Abnormal MRI, spine 11/2011   ?discitis L5-S1, s/p CT bx 12/12/11  . Allergic rhinitis, cause unspecified 01/31/2013  . Arthritis   . Asthma   . Cervical cancer ([place])   . Chronic diastolic heart failure ([employer])   . Diabetic retinopathy ([place])   . GERD (gastroesophageal reflux disease)   . H/O cardiovascular stress test    Nuclear study in 2011 normal perfusion  . HOCM (hypertrophic obstructive cardiomyopathy) ([place])    Echo 12/19/11: Severe LVH, EF  55-65%, dynamic obstruction, wall motion normal, grade 1 diastolic dysfunction, systolic anterior motion of the mitral valve with mild MS and mild MR, mild LAE, PASP 34  . Hyperlipidemia   . Hypertension   . Kidney stones   . Obesity   . Type II or unspecified type diabetes mellitus without mention of complication, uncontrolled 01/31/2013   Past Surgical History:  Procedure Laterality Date  . ABDOMINAL HYSTERECTOMY    . JOINT REPLACEMENT     right knee 2006  . rotater cuff     right, 2005    reports that she has never smoked. She has never used smokeless tobacco. She reports that she does not drink alcohol or use drugs. family history includes Alcohol abuse in an other family member; Arthritis in some other family members; Cancer in an other family member; Diabetes in some other family members; Heart disease in her father and other family members; Hypertension in some other family members; Kidney disease in an other family member; Mental illness in an other family member; Sudden death in an other family member. Allergies  Allergen Reactions  . Sulfa Antibiotics Hives  . Metformin And Related   . Other Hives    Unknown drug  . Prednisone    Current Outpatient Medications on File Prior to Visit  Medication Sig Dispense Refill  . albuterol (ACCUNEB) 0.63 MG/3ML nebulizer solution INHALE 1 VIAL BY MOUTH VIA NEBULIZATION EVERY 6 HOURS AS NEEDED FOR WHEEZING. 75 mL 12  . albuterol (PROAIR HFA) 108 (90 Base) MCG/ACT  inhaler Inhale 2 puffs into the lungs 2 (two) times daily. 8.5 Inhaler 2  . allopurinol (ZYLOPRIM) 100 MG tablet Take 1 tablet (100 mg total) by mouth daily. 90 tablet 3  . ammonium lactate (AMLACTIN) 12 % cream APPLY TOPICALLY AS NEEDED FOR DRY SKIN. 385 g 0  . aspirin 81 MG tablet Take 162 mg by mouth daily.     Marland Kitchen atorvastatin (LIPITOR) 40 MG tablet Take 1 tablet (40 mg total) by mouth daily. 90 tablet 3  . Blood Glucose Monitoring Suppl (ACCU-CHEK AVIVA PLUS) w/Device KIT Use  to check blood sugar three times daily 1 kit 2  . cetirizine (ZYRTEC) 10 MG tablet TAKE 1 TABLET (10 MG TOTAL) BY MOUTH DAILY as needed 90 tablet 3  . Continuous Blood Gluc Receiver (FREESTYLE LIBRE READER) DEVI Apply 1 Device topically 4 (four) times daily as needed. E11.9 1 Device 0  . Continuous Blood Gluc Sensor (FREESTYLE LIBRE 14 DAY SENSOR) MISC Apply 1 Device topically every 14 (fourteen) days. E11.9 6 each 3  . diltiazem (CARDIZEM CD) 180 MG 24 hr capsule TAKE 1 CAPSULE (180 MG TOTAL) BY MOUTH DAILY. 90 capsule 2  . furosemide (LASIX) 20 MG tablet TAKE 3 TABLETS (60 MG TOTAL) BY MOUTH 2 TIMES DAILY. 180 tablet 2  . gabapentin (NEURONTIN) 300 MG capsule TAKE 1 TO 2 CAPSULES BY MOUTH EVERY DAY AT BEDTIME **ANNUAL APPOINTMENT NEEDED FOR REFILLS** 180 capsule 0  . glipiZIDE (GLUCOTROL XL) 2.5 MG 24 hr tablet TAKE 1 TABLET BY MOUTH DAILY WITH BREAKFAST. 90 tablet 3  . glucose blood (ACCU-CHEK AVIVA PLUS) test strip Use to check blood sugar three times daily 300 each 3  . lidocaine (LIDODERM) 5 % Place 1 patch onto the skin daily. Remove & Discard patch within 12 hours or as directed by MD 40 patch 1  . meclizine (ANTIVERT) 25 MG tablet TAKE 1 TABLET BY MOUTH THREE TIMES A DAY 30 tablet 5  . meloxicam (MOBIC) 15 MG tablet TAKE 1 TABLET (15 MG TOTAL) BY MOUTH DAILY AS NEEDED FOR PAIN. 90 tablet 1  . metoprolol tartrate (LOPRESSOR) 50 MG tablet TAKE 1 TABLET BY MOUTH TWICE A DAY 180 tablet 3  . ONETOUCH DELICA PLUS LANCETS MISC Apply 1 Device topically daily. Use to check lood sugar once daily E11.9 100 each 3  . pantoprazole (PROTONIX) 40 MG tablet TAKE 1 TABLET BY MOUTH EVERY DAY 90 tablet 3  . tiZANidine (ZANAFLEX) 2 MG tablet Take 1 tablet (2 mg total) by mouth every 6 (six) hours as needed for muscle spasms. 40 tablet 1  . traMADol (ULTRAM) 50 MG tablet Take 1 tablet (50 mg total) by mouth every 6 (six) hours as needed. 30 tablet 0  . triamcinolone (NASACORT AQ) 55 MCG/ACT AERO nasal inhaler  Place 2 sprays into the nose daily. 1 Inhaler 12  . VENTOLIN HFA 108 (90 Base) MCG/ACT inhaler TAKE 2 PUFFS BY MOUTH TWICE A DAY 18 Inhaler 2   No current facility-administered medications on file prior to visit.    Review of Systems Constitutional: Negative for other unusual diaphoresis, sweats, appetite or weight changes HENT: Negative for other worsening hearing loss, ear pain, facial swelling, mouth sores or neck stiffness.   Eyes: Negative for other worsening pain, redness or other visual disturbance.  Respiratory: Negative for other stridor or swelling Cardiovascular: Negative for other palpitations or other chest pain  Gastrointestinal: Negative for worsening diarrhea or loose stools, blood in stool, distention or other pain Genitourinary: Negative  for hematuria, flank pain or other change in urine volume.  Musculoskeletal: Negative for myalgias or other joint swelling.  Skin: Negative for other color change, or other wound or worsening drainage.  Neurological: Negative for other syncope or numbness. Hematological: Negative for other adenopathy or swelling Psychiatric/Behavioral: Negative for hallucinations, other worsening agitation, SI, self-injury, or new decreased concentration ALl other system neg per pt    Objective:   Physical Exam BP 124/82   Pulse 61   Temp 97.7 F (36.5 C) (Oral)   Ht 5' (1.524 m)   SpO2 91%   BMI 43.94 kg/m  VS noted,  Constitutional: Pt is oriented to person, place, and time. Appears well-developed and well-nourished, in no significant distress and comfortable Head: Normocephalic and atraumatic  Eyes: Conjunctivae and EOM are normal. Pupils are equal, round, and reactive to light Bilat tm's with mild erythema.  Max sinus areas non tender.  Pharynx with severe erythema, no exudate Right Ear: External ear normal without discharge Left Ear: External ear normal without discharge Nose: Nose without discharge or deformity Mouth/Throat: Oropharynx is  without other ulcerations and moist  Neck: Normal range of motion. Neck supple. No JVD present. No tracheal deviation present or significant neck LA or mass Cardiovascular: Normal rate, regular rhythm, normal heart sounds and intact distal pulses.   Pulmonary/Chest: WOB normal and breath sounds without rales or wheezing  Abdominal: Soft. Bowel sounds are normal. NT. No HSM  Musculoskeletal: Normal range of motion. Exhibits no edema Lymphadenopathy: Has no other cervical adenopathy.  Neurological: Pt is alert and oriented to person, place, and time. Pt has normal reflexes. No cranial nerve deficit. Motor grossly intact, Gait intact Skin: Skin is warm and dry. No rash noted or new ulcerations Psychiatric:  Has normal mood and affect. Behavior is normal without agitation No other exam findings Lab Results  Component Value Date   WBC 5.4 07/17/2018   HGB 12.5 07/17/2018   HCT 38.6 07/17/2018   PLT 266.0 07/17/2018   GLUCOSE 89 07/17/2018   CHOL 208 (H) 07/17/2018   TRIG 147.0 07/17/2018   HDL 50.40 07/17/2018   LDLCALC 128 (H) 07/17/2018   ALT 7 07/17/2018   AST 9 07/17/2018   NA 140 07/17/2018   K 3.9 07/17/2018   CL 102 07/17/2018   CREATININE 0.83 07/17/2018   BUN 21 07/17/2018   CO2 31 07/17/2018   TSH 0.38 07/17/2018   INR 1.03 12/12/2011   HGBA1C 6.1 07/17/2018   MICROALBUR 4.5 (H) 06/20/2017       Assessment & Plan:

## 2018-07-17 NOTE — Assessment & Plan Note (Signed)
Cannot r/o osa, for pulm referral

## 2018-07-19 ENCOUNTER — Encounter: Payer: Self-pay | Admitting: Internal Medicine

## 2018-07-19 ENCOUNTER — Other Ambulatory Visit: Payer: Self-pay | Admitting: Internal Medicine

## 2018-07-19 MED ORDER — ATORVASTATIN CALCIUM 80 MG PO TABS: 80.0000 mg | ORAL_TABLET | Freq: Every day | ORAL | 3 refills | Status: DC

## 2018-07-22 ENCOUNTER — Telehealth: Payer: Self-pay

## 2018-07-22 NOTE — Telephone Encounter (Signed)
Tried to contact pt, phone continuously rings. No VM to leave a msg.    CRM created.

## 2018-07-22 NOTE — Telephone Encounter (Signed)
-----   Message from [name], MD sent at 07/19/2018  5:01 PM EST ----- Letter sent, cont same tx except  The test results show that your current treatment is OK, except the LDL cholesterol is still too high.  We should increase the lipitor to 80 mg per day.,  I will send the prescription, and you should hear from the office as well.Alyssa Ingram to please inform pt, I will do rx

## 2018-07-23 ENCOUNTER — Encounter: Payer: Self-pay | Admitting: Podiatry

## 2018-07-23 ENCOUNTER — Ambulatory Visit: Payer: Medicare Other | Admitting: Podiatry

## 2018-07-23 DIAGNOSIS — M79674 Pain in right toe(s): Secondary | ICD-10-CM

## 2018-07-23 DIAGNOSIS — B351 Tinea unguium: Secondary | ICD-10-CM

## 2018-07-23 DIAGNOSIS — M79675 Pain in left toe(s): Secondary | ICD-10-CM | POA: Diagnosis not present

## 2018-07-23 DIAGNOSIS — E1149 Type 2 diabetes mellitus with other diabetic neurological complication: Secondary | ICD-10-CM | POA: Diagnosis not present

## 2018-07-29 ENCOUNTER — Ambulatory Visit: Payer: Medicare Other

## 2018-07-30 ENCOUNTER — Ambulatory Visit: Payer: Medicare Other | Admitting: Family Medicine

## 2018-08-01 NOTE — Progress Notes (Signed)
Subjective: 73 y.o. returns the office today for painful, elongated, thickened toenails which she cannot trim herself.  She denies any redness or drainage or any swelling to the toenail sites.  She also states that she feels the neuropathy may be getting somewhat worse.  She is on gabapentin.  She has no other concerns today.  PCP: [name], MD  Objective: AAO 3, NAD DP/PT pulses palpable, CRT less than 3 seconds Chronic bilateral lower extremity edema.  Venous insufficiency changes are present. Nails hypertrophic, dystrophic, elongated, brittle, discolored 10. There is tenderness overlying the nails 1-5 bilaterally. There is no surrounding erythema or drainage along the nail sites. Dry skin to the heels.  No skin fissures.  There is no drainage or signs of infection No open lesions or pre-ulcerative lesions are identified. No pain with calf compression, warmth, erythema.  Assessment: Patient presents with symptomatic onychomycosis; type 2 diabetes with neuropathy  Plan: -Treatment options including alternatives, risks, complications were discussed -Nails sharply debrided 10 without complication/bleeding. -Clinically doing well gabapentin for the neuropathy.  I also ordered a compound cream today through [facility] for neuropathy that she can apply as needed. -Discussed daily foot inspection. If there are any changes, to call the office immediately.  -Follow-up in 3 months or sooner if any problems are to arise. In the meantime, encouraged to call the office with any questions, concerns, changes symptoms.  Celesta Gentile, DPM

## 2018-08-07 ENCOUNTER — Ambulatory Visit: Payer: Medicare Other

## 2018-09-27 ENCOUNTER — Other Ambulatory Visit: Payer: Self-pay | Admitting: Internal Medicine

## 2018-10-01 ENCOUNTER — Other Ambulatory Visit: Payer: Self-pay | Admitting: Internal Medicine

## 2018-10-22 ENCOUNTER — Ambulatory Visit: Payer: Medicare Other | Admitting: Podiatry

## 2018-11-11 ENCOUNTER — Ambulatory Visit: Payer: Medicare Other | Admitting: Podiatry

## 2018-11-21 ENCOUNTER — Ambulatory Visit: Payer: Medicare Other

## 2018-12-02 ENCOUNTER — Telehealth: Payer: Self-pay | Admitting: Internal Medicine

## 2018-12-02 NOTE — Telephone Encounter (Signed)
Called patient to schedule AWV, but patient did not answer and no voicemail set up.  Will try to call patient at a later time. SF

## 2018-12-10 ENCOUNTER — Ambulatory Visit: Payer: Medicare Other | Admitting: Podiatry

## 2018-12-13 ENCOUNTER — Other Ambulatory Visit: Payer: Self-pay | Admitting: Internal Medicine

## 2018-12-31 ENCOUNTER — Encounter: Payer: Self-pay | Admitting: Podiatry

## 2018-12-31 ENCOUNTER — Ambulatory Visit (INDEPENDENT_AMBULATORY_CARE_PROVIDER_SITE_OTHER): Payer: Medicare Other | Admitting: Podiatry

## 2018-12-31 ENCOUNTER — Ambulatory Visit (INDEPENDENT_AMBULATORY_CARE_PROVIDER_SITE_OTHER): Payer: Medicare Other | Admitting: Internal Medicine

## 2018-12-31 ENCOUNTER — Encounter: Payer: Self-pay | Admitting: Internal Medicine

## 2018-12-31 ENCOUNTER — Other Ambulatory Visit (INDEPENDENT_AMBULATORY_CARE_PROVIDER_SITE_OTHER): Payer: Medicare Other

## 2018-12-31 ENCOUNTER — Other Ambulatory Visit: Payer: Self-pay

## 2018-12-31 VITALS — BP 142/88 | HR 54 | Temp 97.8°F | Ht 60.0 in | Wt 213.0 lb

## 2018-12-31 VITALS — Temp 97.4°F

## 2018-12-31 DIAGNOSIS — E785 Hyperlipidemia, unspecified: Secondary | ICD-10-CM | POA: Diagnosis not present

## 2018-12-31 DIAGNOSIS — B351 Tinea unguium: Secondary | ICD-10-CM | POA: Diagnosis not present

## 2018-12-31 DIAGNOSIS — J309 Allergic rhinitis, unspecified: Secondary | ICD-10-CM

## 2018-12-31 DIAGNOSIS — M79675 Pain in left toe(s): Secondary | ICD-10-CM | POA: Diagnosis not present

## 2018-12-31 DIAGNOSIS — M79674 Pain in right toe(s): Secondary | ICD-10-CM

## 2018-12-31 DIAGNOSIS — E1149 Type 2 diabetes mellitus with other diabetic neurological complication: Secondary | ICD-10-CM | POA: Diagnosis not present

## 2018-12-31 DIAGNOSIS — J4530 Mild persistent asthma, uncomplicated: Secondary | ICD-10-CM | POA: Diagnosis not present

## 2018-12-31 DIAGNOSIS — I10 Essential (primary) hypertension: Secondary | ICD-10-CM

## 2018-12-31 LAB — LIPID PANEL
Cholesterol: 206 mg/dL — ABNORMAL HIGH (ref 0–200)
HDL: 55.6 mg/dL (ref >39.00)
LDL Cholesterol: 129 mg/dL — ABNORMAL HIGH (ref 0–99)
NonHDL: 149.98
Total CHOL/HDL Ratio: 4
Triglycerides: 106 mg/dL (ref 0.0–149.0)
VLDL: 21.2 mg/dL (ref 0.0–40.0)

## 2018-12-31 LAB — HEPATIC FUNCTION PANEL
ALT: 8 U/L (ref 0–35)
AST: 14 U/L (ref 0–37)
Albumin: 4.1 g/dL (ref 3.5–5.2)
Alkaline Phosphatase: 86 U/L (ref 39–117)
Bilirubin, Direct: 0.1 mg/dL (ref 0.0–0.3)
Total Bilirubin: 0.4 mg/dL (ref 0.2–1.2)
Total Protein: 8.1 g/dL (ref 6.0–8.3)

## 2018-12-31 MED ORDER — CETIRIZINE HCL 10 MG PO TABS: ORAL_TABLET | ORAL | 3 refills | Status: DC

## 2018-12-31 MED ORDER — TRIAMCINOLONE ACETONIDE 55 MCG/ACT NA AERO: 2.0000 | INHALATION_SPRAY | Freq: Every day | NASAL | 3 refills | Status: AC

## 2018-12-31 NOTE — Assessment & Plan Note (Signed)
Mild to mod, for zyrtec, nasacort asd,  to f/u any worsening symptoms or concerns 

## 2018-12-31 NOTE — Assessment & Plan Note (Signed)
stable overall by history and exam, recent data reviewed with pt, and pt to continue medical treatment as before,  to f/u any worsening symptoms or concerns  

## 2018-12-31 NOTE — Assessment & Plan Note (Signed)
Unclear if pt taking the lipitor 80 as per last visit, for f/u lipids

## 2018-12-31 NOTE — Progress Notes (Signed)
Subjective:    Patient ID: Alyssa Ingram, female    DOB: [birth year], 73 y.o.   MRN: [id number]  HPI  Here to f/u; overall doing ok,  Pt denies chest pain, increasing sob or doe, wheezing, orthopnea, PND, increased LE swelling, palpitations, dizziness or syncope.  Pt denies new neurological symptoms such as new headache, or facial or extremity weakness or numbness.  Pt denies polydipsia, polyuria, or low sugar episode.  Pt states overall good compliance with meds, mostly trying to follow appropriate diet, with wt overall stable.  Does have several wks ongoing nasal allergy symptoms with clearish congestion, itch and sneezing, without fever, pain, ST, cough, swelling or wheezing.   Pt denies fever, wt loss, night sweats, loss of appetite, or other constitutional symptoms  Past Medical History:  Diagnosis Date  . Abnormal MRI, spine 11/2011   ?discitis L5-S1, s/p CT bx 12/12/11  . Allergic rhinitis, cause unspecified 01/31/2013  . Arthritis   . Asthma   . Cervical cancer ([place])   . Chronic diastolic heart failure ([place])   . Diabetic retinopathy ([place])   . GERD (gastroesophageal reflux disease)   . H/O cardiovascular stress test    Nuclear study in 2011 normal perfusion  . HOCM (hypertrophic obstructive cardiomyopathy) ([place])    Echo 12/19/11: Severe LVH, EF 55-65%, dynamic obstruction, wall motion normal, grade 1 diastolic dysfunction, systolic anterior motion of the mitral valve with mild MS and mild MR, mild LAE, PASP 34  . Hyperlipidemia   . Hypertension   . Kidney stones   . Obesity   . Type II or unspecified type diabetes mellitus without mention of complication, uncontrolled 01/31/2013   Past Surgical History:  Procedure Laterality Date  . ABDOMINAL HYSTERECTOMY    . JOINT REPLACEMENT     right knee 2006  . rotater cuff     right, 2005    reports that she has never smoked. She has never used smokeless tobacco. She reports that she does not drink alcohol or use drugs. family history  includes Alcohol abuse in an other family member; Arthritis in some other family members; Cancer in an other family member; Diabetes in some other family members; Heart disease in her father and other family members; Hypertension in some other family members; Kidney disease in an other family member; Mental illness in an other family member; Sudden death in an other family member. Allergies  Allergen Reactions  . Sulfa Antibiotics Hives  . Metformin And Related   . Other Hives    Unknown drug  . Prednisone    Current Outpatient Medications on File Prior to Visit  Medication Sig Dispense Refill  . albuterol (ACCUNEB) 0.63 MG/3ML nebulizer solution INHALE 1 VIAL BY MOUTH VIA NEBULIZATION EVERY 6 HOURS AS NEEDED FOR WHEEZING. 75 mL 12  . albuterol (PROAIR HFA) 108 (90 Base) MCG/ACT inhaler Inhale 2 puffs into the lungs 2 (two) times daily. 8.5 Inhaler 2  . allopurinol (ZYLOPRIM) 100 MG tablet Take 1 tablet (100 mg total) by mouth daily. 90 tablet 3  . ammonium lactate (AMLACTIN) 12 % cream APPLY TOPICALLY AS NEEDED FOR DRY SKIN. 385 g 0  . aspirin 81 MG tablet Take 162 mg by mouth daily.     Marland Kitchen atorvastatin (LIPITOR) 80 MG tablet Take 1 tablet (80 mg total) by mouth daily. 90 tablet 3  . Blood Glucose Monitoring Suppl (ACCU-CHEK AVIVA PLUS) w/Device KIT Use to check blood sugar three times daily 1 kit 2  . Continuous Blood  Gluc Receiver (FREESTYLE LIBRE READER) DEVI Apply 1 Device topically 4 (four) times daily as needed. E11.9 1 Device 0  . Continuous Blood Gluc Sensor (FREESTYLE LIBRE 14 DAY SENSOR) MISC Apply 1 Device topically every 14 (fourteen) days. E11.9 6 each 3  . diltiazem (CARDIZEM CD) 180 MG 24 hr capsule TAKE 1 CAPSULE (180 MG TOTAL) BY MOUTH DAILY. 90 capsule 2  . furosemide (LASIX) 20 MG tablet TAKE 3 TABLETS (60 MG TOTAL) BY MOUTH 2 TIMES DAILY. 180 tablet 1  . gabapentin (NEURONTIN) 300 MG capsule TAKE 1 TO 2 CAPSULES BY MOUTH AT BEDTIME 180 capsule 1  . glipiZIDE (GLUCOTROL XL)  2.5 MG 24 hr tablet TAKE 1 TABLET BY MOUTH DAILY WITH BREAKFAST. 90 tablet 3  . glucose blood (ACCU-CHEK AVIVA PLUS) test strip Use to check blood sugar three times daily 300 each 3  . lidocaine (LIDODERM) 5 % Place 1 patch onto the skin daily. Remove & Discard patch within 12 hours or as directed by MD 40 patch 1  . meclizine (ANTIVERT) 25 MG tablet TAKE 1 TABLET BY MOUTH THREE TIMES A DAY 30 tablet 5  . meloxicam (MOBIC) 15 MG tablet TAKE 1 TABLET (15 MG TOTAL) BY MOUTH DAILY AS NEEDED FOR PAIN. 90 tablet 1  . metoprolol tartrate (LOPRESSOR) 50 MG tablet TAKE 1 TABLET BY MOUTH TWICE A DAY 180 tablet 3  . NON FORMULARY [facility]  CREAMS- #11 (PERIPHERAL NEUROPATHY CREAM)    . ONETOUCH DELICA PLUS LANCETS MISC Apply 1 Device topically daily. Use to check lood sugar once daily E11.9 100 each 3  . pantoprazole (PROTONIX) 40 MG tablet TAKE 1 TABLET BY MOUTH EVERY DAY 90 tablet 3  . tiZANidine (ZANAFLEX) 2 MG tablet Take 1 tablet (2 mg total) by mouth every 6 (six) hours as needed for muscle spasms. 40 tablet 1  . traMADol (ULTRAM) 50 MG tablet Take 1 tablet (50 mg total) by mouth every 6 (six) hours as needed. 30 tablet 0  . VENTOLIN HFA 108 (90 Base) MCG/ACT inhaler TAKE 2 PUFFS BY MOUTH TWICE A DAY 18 Inhaler 2   No current facility-administered medications on file prior to visit.    Review of Systems  Constitutional: Negative for other unusual diaphoresis or sweats HENT: Negative for ear discharge or swelling Eyes: Negative for other worsening visual disturbances Respiratory: Negative for stridor or other swelling  Gastrointestinal: Negative for worsening distension or other blood Genitourinary: Negative for retention or other urinary change Musculoskeletal: Negative for other MSK pain or swelling Skin: Negative for color change or other new lesions Neurological: Negative for worsening tremors and other numbness  Psychiatric/Behavioral: Negative for worsening agitation or other  fatigue All other system neg per pt    Objective:   Physical Exam BP (!) 142/88   Pulse (!) 54   Temp 97.8 F (36.6 C) (Oral)   Ht 5' (1.524 m)   Wt 213 lb (96.6 kg)   SpO2 96%   BMI 41.60 kg/m  VS noted,  Constitutional: Pt appears in NAD HENT: Head: NCAT.  Right Ear: External ear normal.  Left Ear: External ear normal.  Eyes: . Pupils are equal, round, and reactive to light. Conjunctivae and EOM are normal Bilat tm's with mild erythema.  Max sinus areas non tender.  Pharynx with mild erythema, no exudate Nose: without d/c or deformity Neck: Neck supple. Gross normal ROM Cardiovascular: Normal rate and regular rhythm.   Pulmonary/Chest: Effort normal and breath sounds without rales or wheezing.  Abd:  Soft, NT, ND, + BS, no organomegaly Neurological: Pt is alert. At baseline orientation, motor grossly intact Skin: Skin is warm. No rashes, other new lesions, no LE edema Psychiatric: Pt behavior is normal without agitation  No other exam findings Lab Results  Component Value Date   WBC 5.4 07/17/2018   HGB 12.5 07/17/2018   HCT 38.6 07/17/2018   PLT 266.0 07/17/2018   GLUCOSE 89 07/17/2018   CHOL 206 (H) 12/31/2018   TRIG 106.0 12/31/2018   HDL 55.60 12/31/2018   LDLCALC 129 (H) 12/31/2018   ALT 8 12/31/2018   AST 14 12/31/2018   NA 140 07/17/2018   K 3.9 07/17/2018   CL 102 07/17/2018   CREATININE 0.83 07/17/2018   BUN 21 07/17/2018   CO2 31 07/17/2018   TSH 0.38 07/17/2018   INR 1.03 12/12/2011   HGBA1C 6.1 07/17/2018   MICROALBUR 4.5 (H) 06/20/2017      Assessment & Plan:

## 2018-12-31 NOTE — Patient Instructions (Signed)
Please take all new medication as prescribed - the zyrtec and the nasacort for allergies  Please continue all other medications as before, and refills have been done if requested.  Please have the pharmacy call with any other refills you may need.  Please continue your efforts at being more active, low cholesterol diet, and weight control.  Please keep your appointments with your specialists as you may have planned  Please go to the LAB in the Basement (turn left off the elevator) for the tests to be done today  You will be contacted by phone if any changes need to be made immediately.  Otherwise, you will receive a letter about your results with an explanation, but please check with MyChart first.  Please remember to sign up for MyChart if you have not done so, as this will be important to you in the future with finding out test results, communicating by private email, and scheduling acute appointments online when needed.  Please return in 6 months, or sooner if needed, with Lab testing done 3-5 days before

## 2019-01-01 ENCOUNTER — Ambulatory Visit: Payer: Medicare Other

## 2019-01-06 NOTE — Progress Notes (Signed)
Subjective: 73 y.o. returns the office today for painful, elongated, thickened toenails which she cannot trim herself and denies any redness or drainage or any swelling to the toenail sites.  Still has neuropathy symptoms.  Topical compound cream is been helpful. She has no other concerns today.  PCP: [name], MD  Objective: AAO 3, NAD DP/PT pulses palpable, CRT less than 3 seconds Chronic bilateral lower extremity edema.  Venous insufficiency changes are present. Nails hypertrophic, dystrophic, elongated, brittle, discolored 10. There is tenderness overlying the nails 1-5 bilaterally. There is no surrounding erythema or drainage along the nail sites. Dry skin to the heels.  No skin fissures.  There is no drainage or signs of infection No open lesions or pre-ulcerative lesions are identified. No pain with calf compression, warmth, erythema.  Assessment: Patient presents with symptomatic onychomycosis; type 2 diabetes with neuropathy  Plan: -Treatment options including alternatives, risks, complications were discussed -Nails sharply debrided 10 without complication/bleeding. -Continue topical medicine for neuropathy. -Discussed daily foot inspection. If there are any changes, to call the office immediately.  -Follow-up in 3 months or sooner if any problems are to arise. In the meantime, encouraged to call the office with any questions, concerns, changes symptoms.  Celesta Gentile, DPM

## 2019-01-13 NOTE — Progress Notes (Deleted)
[name] [address], [place] [address] Phone: [phone] Subjective:    I'm seeing this patient by the request  of:    CC:   [id number]  Alyssa Ingram is a 73 y.o. female coming in with complaint of ***  Onset-  Location Duration-  Character- Aggravating factors- Reliving factors-  Therapies tried-  Severity-     Past Medical History:  Diagnosis Date   Abnormal MRI, spine 11/2011   ?discitis L5-S1, s/p CT bx 12/12/11   Allergic rhinitis, cause unspecified 01/31/2013   Arthritis    Asthma    Cervical cancer ([place])    Chronic diastolic heart failure ([facility])    Diabetic retinopathy ([facility])    GERD (gastroesophageal reflux disease)    H/O cardiovascular stress test    Nuclear study in 2011 normal perfusion   HOCM (hypertrophic obstructive cardiomyopathy) ([place])    Echo 12/19/11: Severe LVH, EF 55-65%, dynamic obstruction, wall motion normal, grade 1 diastolic dysfunction, systolic anterior motion of the mitral valve with mild MS and mild MR, mild LAE, PASP 34   Hyperlipidemia    Hypertension    Kidney stones    Obesity    Type II or unspecified type diabetes mellitus without mention of complication, uncontrolled 01/31/2013   Past Surgical History:  Procedure Laterality Date   ABDOMINAL HYSTERECTOMY     JOINT REPLACEMENT     right knee 2006   rotater cuff     right, 2005   Social History   Socioeconomic History   Marital status: Widowed    Spouse name: Not on file   Number of children: 5   Years of education: 14   Highest education level: Not on file  Occupational History   Occupation: Retired Scientist, research (life sciences) strain: Not on file   Food insecurity    Worry: Not on file    Inability: Not on Lexicographer needs    Medical: Not on file    Non-medical: Not on file  Tobacco Use   Smoking status: Never Smoker   Smokeless tobacco: Never Used  Substance  and Sexual Activity   Alcohol use: No    Alcohol/week: 0.0 standard drinks   Drug use: No   Sexual activity: Not on file  Lifestyle   Physical activity    Days per week: Not on file    Minutes per session: Not on file   Stress: Not on file  Relationships   Social connections    Talks on phone: Not on file    Gets together: Not on file    Attends religious service: Not on file    Active member of club or organization: Not on file    Attends meetings of clubs or organizations: Not on file    Relationship status: Not on file  Other Topics Concern   Not on file  Social History Narrative   Not on file   Allergies  Allergen Reactions   Sulfa Antibiotics Hives   Metformin And Related    Other Hives    Unknown drug   Prednisone    Family History  Problem Relation Age of Onset   Heart disease Father    Arthritis Other    Heart disease Other    Hypertension Other    Diabetes Other    Alcohol abuse Other    Arthritis Other    Cancer Other  Lung Cancer   Heart disease Other    Hypertension Other    Sudden death Other    Kidney disease Other    Mental illness Other    Diabetes Other    Colon cancer Neg Hx     Current Outpatient Medications (Endocrine & Metabolic):    glipiZIDE (GLUCOTROL XL) 2.5 MG 24 hr tablet, TAKE 1 TABLET BY MOUTH DAILY WITH BREAKFAST.  Current Outpatient Medications (Cardiovascular):    atorvastatin (LIPITOR) 80 MG tablet, Take 1 tablet (80 mg total) by mouth daily.   diltiazem (CARDIZEM CD) 180 MG 24 hr capsule, TAKE 1 CAPSULE (180 MG TOTAL) BY MOUTH DAILY.   furosemide (LASIX) 20 MG tablet, TAKE 3 TABLETS (60 MG TOTAL) BY MOUTH 2 TIMES DAILY.   metoprolol tartrate (LOPRESSOR) 50 MG tablet, TAKE 1 TABLET BY MOUTH TWICE A DAY  Current Outpatient Medications (Respiratory):    albuterol (ACCUNEB) 0.63 MG/3ML nebulizer solution, INHALE 1 VIAL BY MOUTH VIA NEBULIZATION EVERY 6 HOURS AS NEEDED FOR WHEEZING.    albuterol (PROAIR HFA) 108 (90 Base) MCG/ACT inhaler, Inhale 2 puffs into the lungs 2 (two) times daily.   cetirizine (ZYRTEC) 10 MG tablet, TAKE 1 TABLET (10 MG TOTAL) BY MOUTH DAILY as needed   triamcinolone (NASACORT AQ) 55 MCG/ACT AERO nasal inhaler, Place 2 sprays into the nose daily.   VENTOLIN HFA 108 (90 Base) MCG/ACT inhaler, TAKE 2 PUFFS BY MOUTH TWICE A DAY  Current Outpatient Medications (Analgesics):    allopurinol (ZYLOPRIM) 100 MG tablet, Take 1 tablet (100 mg total) by mouth daily.   aspirin 81 MG tablet, Take 162 mg by mouth daily.    meloxicam (MOBIC) 15 MG tablet, TAKE 1 TABLET (15 MG TOTAL) BY MOUTH DAILY AS NEEDED FOR PAIN.   traMADol (ULTRAM) 50 MG tablet, Take 1 tablet (50 mg total) by mouth every 6 (six) hours as needed.   Current Outpatient Medications (Other):    ammonium lactate (AMLACTIN) 12 % cream, APPLY TOPICALLY AS NEEDED FOR DRY SKIN.   Blood Glucose Monitoring Suppl (ACCU-CHEK AVIVA PLUS) w/Device KIT, Use to check blood sugar three times daily   Continuous Blood Gluc Receiver (FREESTYLE LIBRE READER) DEVI, Apply 1 Device topically 4 (four) times daily as needed. E11.9   Continuous Blood Gluc Sensor (FREESTYLE LIBRE 14 DAY SENSOR) MISC, Apply 1 Device topically every 14 (fourteen) days. E11.9   gabapentin (NEURONTIN) 300 MG capsule, TAKE 1 TO 2 CAPSULES BY MOUTH AT BEDTIME   glucose blood (ACCU-CHEK AVIVA PLUS) test strip, Use to check blood sugar three times daily   lidocaine (LIDODERM) 5 %, Place 1 patch onto the skin daily. Remove & Discard patch within 12 hours or as directed by MD   meclizine (ANTIVERT) 25 MG tablet, TAKE 1 TABLET BY MOUTH THREE TIMES A DAY   NON FORMULARY, [facility]  CREAMS- #11 (PERIPHERAL NEUROPATHY CREAM)   ONETOUCH DELICA PLUS LANCETS MISC, Apply 1 Device topically daily. Use to check lood sugar once daily E11.9   pantoprazole (PROTONIX) 40 MG tablet, TAKE 1 TABLET BY MOUTH EVERY DAY   tiZANidine  (ZANAFLEX) 2 MG tablet, Take 1 tablet (2 mg total) by mouth every 6 (six) hours as needed for muscle spasms.    Past medical history, social, surgical and family history all reviewed in electronic medical record.  No pertanent information unless stated regarding to the chief complaint.   Review of Systems:  No headache, visual changes, nausea, vomiting, diarrhea, constipation, dizziness, abdominal pain, skin rash, fevers, chills,  night sweats, weight loss, swollen lymph nodes, body aches, joint swelling, muscle aches, chest pain, shortness of breath, mood changes.   Objective  There were no vitals taken for this visit. Systems examined below as of    General: No apparent distress alert and oriented x3 mood and affect normal, dressed appropriately.  HEENT: Pupils equal, extraocular movements intact  Respiratory: Patient's speak in full sentences and does not appear short of breath  Cardiovascular: No lower extremity edema, non tender, no erythema  Skin: Warm dry intact with no signs of infection or rash on extremities or on axial skeleton.  Abdomen: Soft nontender  Neuro: Cranial nerves II through XII are intact, neurovascularly intact in all extremities with 2+ DTRs and 2+ pulses.  Lymph: No lymphadenopathy of posterior or anterior cervical chain or axillae bilaterally.  Gait normal with good balance and coordination.  MSK:  Non tender with full range of motion and good stability and symmetric strength and tone of shoulders, elbows, wrist, hip, knee and ankles bilaterally.     Impression and Recommendations:     This case required medical decision making of moderate complexity. The above documentation has been reviewed and is accurate and complete Lyndal Pulley, DO       Note: This dictation was prepared with Dragon dictation along with smaller phrase technology. Any transcriptional errors that result from this process are unintentional.

## 2019-01-14 ENCOUNTER — Ambulatory Visit: Payer: Medicare Other | Admitting: Family Medicine

## 2019-01-22 ENCOUNTER — Ambulatory Visit: Payer: Medicare Other | Admitting: Internal Medicine

## 2019-02-11 ENCOUNTER — Ambulatory Visit: Payer: Medicare Other

## 2019-02-19 ENCOUNTER — Ambulatory Visit: Payer: Medicare Other | Admitting: Internal Medicine

## 2019-03-04 ENCOUNTER — Ambulatory Visit (INDEPENDENT_AMBULATORY_CARE_PROVIDER_SITE_OTHER): Payer: Medicare Other | Admitting: Internal Medicine

## 2019-03-04 ENCOUNTER — Ambulatory Visit (INDEPENDENT_AMBULATORY_CARE_PROVIDER_SITE_OTHER): Payer: Medicare Other | Admitting: Family Medicine

## 2019-03-04 ENCOUNTER — Other Ambulatory Visit: Payer: Self-pay

## 2019-03-04 ENCOUNTER — Encounter: Payer: Self-pay | Admitting: Internal Medicine

## 2019-03-04 ENCOUNTER — Encounter: Payer: Self-pay | Admitting: Family Medicine

## 2019-03-04 VITALS — BP 128/72 | HR 78 | Temp 98.1°F | Ht 60.0 in | Wt 221.0 lb

## 2019-03-04 DIAGNOSIS — R6889 Other general symptoms and signs: Secondary | ICD-10-CM | POA: Diagnosis not present

## 2019-03-04 DIAGNOSIS — M7062 Trochanteric bursitis, left hip: Secondary | ICD-10-CM

## 2019-03-04 DIAGNOSIS — E119 Type 2 diabetes mellitus without complications: Secondary | ICD-10-CM

## 2019-03-04 DIAGNOSIS — Z0000 Encounter for general adult medical examination without abnormal findings: Secondary | ICD-10-CM

## 2019-03-04 DIAGNOSIS — E538 Deficiency of other specified B group vitamins: Secondary | ICD-10-CM

## 2019-03-04 DIAGNOSIS — G4700 Insomnia, unspecified: Secondary | ICD-10-CM

## 2019-03-04 DIAGNOSIS — E611 Iron deficiency: Secondary | ICD-10-CM

## 2019-03-04 DIAGNOSIS — I10 Essential (primary) hypertension: Secondary | ICD-10-CM

## 2019-03-04 DIAGNOSIS — Z23 Encounter for immunization: Secondary | ICD-10-CM | POA: Diagnosis not present

## 2019-03-04 DIAGNOSIS — E559 Vitamin D deficiency, unspecified: Secondary | ICD-10-CM

## 2019-03-04 DIAGNOSIS — M1712 Unilateral primary osteoarthritis, left knee: Secondary | ICD-10-CM | POA: Diagnosis not present

## 2019-03-04 DIAGNOSIS — E785 Hyperlipidemia, unspecified: Secondary | ICD-10-CM | POA: Diagnosis not present

## 2019-03-04 NOTE — Patient Instructions (Addendum)
Great to see you as always.  I am hoping the injections help again for a long time Can repeat every 10 weeks if needed Be safe and healthy See me again in 10 weeks if needed

## 2019-03-04 NOTE — Progress Notes (Signed)
Subjective:    Patient ID: Alyssa Ingram, female    DOB: Sep 12, 1945, 73 y.o.   MRN: [id number]  HPI  Here to f/u; overall doing ok,  Pt denies chest pain, increasing sob or doe, wheezing, orthopnea, PND, increased LE swelling, palpitations, dizziness or syncope.  Pt denies new neurological symptoms such as new headache, or facial or extremity weakness or numbness.  Pt denies polydipsia, polyuria, or low sugar episode.  Pt states overall good compliance with meds, mostly trying to follow appropriate diet, with wt overall stable,  but little exercise however.  Also reminds [place] she has low vision left eye and has not f/u optho in the past yr.  Does also have worsening insomnia for several months, just cant seem to get to sleep and stay there, does not want "strong" medicine, and Denies worsening depressive symptoms, suicidal ideation, or panic. Past Medical History:  Diagnosis Date   Abnormal MRI, spine 11/2011   ?discitis L5-S1, s/p CT bx 12/12/11   Allergic rhinitis, cause unspecified 01/31/2013   Arthritis    Asthma    Cervical cancer ([place])    Chronic diastolic heart failure (HCC)    Diabetic retinopathy (HCC)    GERD (gastroesophageal reflux disease)    H/O cardiovascular stress test    Nuclear study in 2011 normal perfusion   HOCM (hypertrophic obstructive cardiomyopathy) ([place])    Echo 12/19/11: Severe LVH, EF 55-65%, dynamic obstruction, wall motion normal, grade 1 diastolic dysfunction, systolic anterior motion of the mitral valve with mild MS and mild MR, mild LAE, PASP 34   Hyperlipidemia    Hypertension    Kidney stones    Obesity    Type II or unspecified type diabetes mellitus without mention of complication, uncontrolled 01/31/2013   Past Surgical History:  Procedure Laterality Date   ABDOMINAL HYSTERECTOMY     JOINT REPLACEMENT     right knee 2006   rotater cuff     right, 2005    reports that she has never smoked. She has never used smokeless tobacco. She  reports that she does not drink alcohol or use drugs. family history includes Alcohol abuse in an other family member; Arthritis in some other family members; Cancer in an other family member; Diabetes in some other family members; Heart disease in her father and other family members; Hypertension in some other family members; Kidney disease in an other family member; Mental illness in an other family member; Sudden death in an other family member. Allergies  Allergen Reactions   Sulfa Antibiotics Hives   Metformin And Related    Other Hives    Unknown drug   Prednisone    Current Outpatient Medications on File Prior to Visit  Medication Sig Dispense Refill   albuterol (ACCUNEB) 0.63 MG/3ML nebulizer solution INHALE 1 VIAL BY MOUTH VIA NEBULIZATION EVERY 6 HOURS AS NEEDED FOR WHEEZING. 75 mL 12   albuterol (PROAIR HFA) 108 (90 Base) MCG/ACT inhaler Inhale 2 puffs into the lungs 2 (two) times daily. 8.5 Inhaler 2   allopurinol (ZYLOPRIM) 100 MG tablet Take 1 tablet (100 mg total) by mouth daily. 90 tablet 3   ammonium lactate (AMLACTIN) 12 % cream APPLY TOPICALLY AS NEEDED FOR DRY SKIN. 385 g 0   aspirin 81 MG tablet Take 162 mg by mouth daily.      atorvastatin (LIPITOR) 80 MG tablet Take 1 tablet (80 mg total) by mouth daily. 90 tablet 3   Blood Glucose Monitoring Suppl (ACCU-CHEK AVIVA PLUS)  w/Device KIT Use to check blood sugar three times daily 1 kit 2   cetirizine (ZYRTEC) 10 MG tablet TAKE 1 TABLET (10 MG TOTAL) BY MOUTH DAILY as needed 90 tablet 3   Continuous Blood Gluc Receiver (FREESTYLE LIBRE READER) DEVI Apply 1 Device topically 4 (four) times daily as needed. E11.9 1 Device 0   Continuous Blood Gluc Sensor (FREESTYLE LIBRE 14 DAY SENSOR) MISC Apply 1 Device topically every 14 (fourteen) days. E11.9 6 each 3   diltiazem (CARDIZEM CD) 180 MG 24 hr capsule TAKE 1 CAPSULE (180 MG TOTAL) BY MOUTH DAILY. 90 capsule 2   furosemide (LASIX) 20 MG tablet TAKE 3 TABLETS (60  MG TOTAL) BY MOUTH 2 TIMES DAILY. 180 tablet 1   gabapentin (NEURONTIN) 300 MG capsule TAKE 1 TO 2 CAPSULES BY MOUTH AT BEDTIME 180 capsule 1   glipiZIDE (GLUCOTROL XL) 2.5 MG 24 hr tablet TAKE 1 TABLET BY MOUTH DAILY WITH BREAKFAST. 90 tablet 3   glucose blood (ACCU-CHEK AVIVA PLUS) test strip Use to check blood sugar three times daily 300 each 3   lidocaine (LIDODERM) 5 % Place 1 patch onto the skin daily. Remove & Discard patch within 12 hours or as directed by MD 40 patch 1   meclizine (ANTIVERT) 25 MG tablet TAKE 1 TABLET BY MOUTH THREE TIMES A DAY 30 tablet 5   meloxicam (MOBIC) 15 MG tablet TAKE 1 TABLET (15 MG TOTAL) BY MOUTH DAILY AS NEEDED FOR PAIN. 90 tablet 1   metoprolol tartrate (LOPRESSOR) 50 MG tablet TAKE 1 TABLET BY MOUTH TWICE A DAY 180 tablet 3   NON FORMULARY [facility]  CREAMS- #11 (PERIPHERAL NEUROPATHY CREAM)     ONETOUCH DELICA PLUS LANCETS MISC Apply 1 Device topically daily. Use to check lood sugar once daily E11.9 100 each 3   pantoprazole (PROTONIX) 40 MG tablet TAKE 1 TABLET BY MOUTH EVERY DAY 90 tablet 3   tiZANidine (ZANAFLEX) 2 MG tablet Take 1 tablet (2 mg total) by mouth every 6 (six) hours as needed for muscle spasms. 40 tablet 1   traMADol (ULTRAM) 50 MG tablet Take 1 tablet (50 mg total) by mouth every 6 (six) hours as needed. 30 tablet 0   triamcinolone (NASACORT AQ) 55 MCG/ACT AERO nasal inhaler Place 2 sprays into the nose daily. 3 Inhaler 3   VENTOLIN HFA 108 (90 Base) MCG/ACT inhaler TAKE 2 PUFFS BY MOUTH TWICE A DAY 18 Inhaler 2   No current facility-administered medications on file prior to visit.    Review of Systems  Constitutional: Negative for other unusual diaphoresis or sweats HENT: Negative for ear discharge or swelling Eyes: Negative for other worsening visual disturbances Respiratory: Negative for stridor or other swelling  Gastrointestinal: Negative for worsening distension or other blood Genitourinary: Negative  for retention or other urinary change Musculoskeletal: Negative for other MSK pain or swelling Skin: Negative for color change or other new lesions Neurological: Negative for worsening tremors and other numbness  Psychiatric/Behavioral: Negative for worsening agitation or other fatigue All otherwise neg per pt     Objective:   Physical Exam BP 128/72 (BP Location: Left Arm, Patient Position: Sitting, Cuff Size: Large)    Pulse 78    Temp 98.1 F (36.7 C) (Oral)    Ht 5' (1.524 m)    Wt 221 lb (100.2 kg)    SpO2 96%    BMI 43.16 kg/m  VS noted,  Constitutional: Pt appears in NAD HENT: Head: NCAT.  Right Ear:  External ear normal.  Left Ear: External ear normal.  Eyes: . Pupils are equal, round, and reactive to light. Conjunctivae and EOM are normal Nose: without d/c or deformity Neck: Neck supple. Gross normal ROM Cardiovascular: Normal rate and regular rhythm.   Pulmonary/Chest: Effort normal and breath sounds without rales or wheezing.  Abd:  Soft, NT, ND, + BS, no organomegaly Neurological: Pt is alert. At baseline orientation, motor grossly intact Skin: Skin is warm. No rashes, other new lesions, no LE edema Psychiatric: Pt behavior is normal without agitation  No other exam findings Lab Results  Component Value Date   WBC 5.4 07/17/2018   HGB 12.5 07/17/2018   HCT 38.6 07/17/2018   PLT 266.0 07/17/2018   GLUCOSE 89 07/17/2018   CHOL 206 (H) 12/31/2018   TRIG 106.0 12/31/2018   HDL 55.60 12/31/2018   LDLCALC 129 (H) 12/31/2018   ALT 8 12/31/2018   AST 14 12/31/2018   NA 140 07/17/2018   K 3.9 07/17/2018   CL 102 07/17/2018   CREATININE 0.83 07/17/2018   BUN 21 07/17/2018   CO2 31 07/17/2018   TSH 0.38 07/17/2018   INR 1.03 12/12/2011   HGBA1C 6.1 07/17/2018   MICROALBUR 4.5 (H) 06/20/2017         Assessment & Plan:

## 2019-03-04 NOTE — Assessment & Plan Note (Addendum)
Repeat injection given today.  Tolerated the procedure well.  We discussed icing regimen and home exercise, which activities to emotions to avoid.  Can repeat injections every 10 weeks if necessary.

## 2019-03-04 NOTE — Progress Notes (Signed)
[facility] [address] [place], [place] [address] Phone: [phone] Subjective:   Alyssa Ingram, am serving as a scribe for Dr. Hulan Saas.  I'm seeing this patient by the request  of:    CC: Bilateral knee pain  [id number]  Alyssa Ingram is a 73 y.o. female coming in with complaint of bilateral knee pain with significant number of comorbidities. Last seen in 01/2018 for knee and hip pain. Injections provided. Patient states left hip and knee.  Patient was last seen a year ago and the injections helped him significantly.  Over the course last month having worsening pain again.  Starting affect daily activities and waking up at night.  Becoming more reliant on the walker.    Past Medical History:  Diagnosis Date  . Abnormal MRI, spine 11/2011   ?discitis L5-S1, s/p CT bx 12/12/11  . Allergic rhinitis, cause unspecified 01/31/2013  . Arthritis   . Asthma   . Cervical cancer ([place])   . Chronic diastolic heart failure ([place])   . Diabetic retinopathy ([place])   . GERD (gastroesophageal reflux disease)   . H/O cardiovascular stress test    Nuclear study in 2011 normal perfusion  . HOCM (hypertrophic obstructive cardiomyopathy) ([place])    Echo 12/19/11: Severe LVH, EF 55-65%, dynamic obstruction, wall motion normal, grade 1 diastolic dysfunction, systolic anterior motion of the mitral valve with mild MS and mild MR, mild LAE, PASP 34  . Hyperlipidemia   . Hypertension   . Kidney stones   . Obesity   . Type II or unspecified type diabetes mellitus without mention of complication, uncontrolled 01/31/2013   Past Surgical History:  Procedure Laterality Date  . ABDOMINAL HYSTERECTOMY    . JOINT REPLACEMENT     right knee 2006  . rotater cuff     right, 2005   Social History   Socioeconomic History  . Marital status: Widowed    Spouse name: Not on file  . Number of children: 5  . Years of education: 71  . Highest education level: Not on file   Occupational History  . Occupation: Retired Mining engineer  . Financial resource strain: Not on file  . Food insecurity    Worry: Not on file    Inability: Not on file  . Transportation needs    Medical: Not on file    Non-medical: Not on file  Tobacco Use  . Smoking status: Never Smoker  . Smokeless tobacco: Never Used  Substance and Sexual Activity  . Alcohol use: Ingram    Alcohol/week: 0.0 standard drinks  . Drug use: Ingram  . Sexual activity: Not on file  Lifestyle  . Physical activity    Days per week: Not on file    Minutes per session: Not on file  . Stress: Not on file  Relationships  . Social Herbalist on phone: Not on file    Gets together: Not on file    Attends religious service: Not on file    Active member of club or organization: Not on file    Attends meetings of clubs or organizations: Not on file    Relationship status: Not on file  Other Topics Concern  . Not on file  Social History Narrative  . Not on file   Allergies  Allergen Reactions  . Sulfa Antibiotics Hives  . Metformin And Related   . Other Hives    Unknown drug  .  Prednisone    Family History  Problem Relation Age of Onset  . Heart disease Father   . Arthritis Other   . Heart disease Other   . Hypertension Other   . Diabetes Other   . Alcohol abuse Other   . Arthritis Other   . Cancer Other        Lung Cancer  . Heart disease Other   . Hypertension Other   . Sudden death Other   . Kidney disease Other   . Mental illness Other   . Diabetes Other   . Colon cancer Neg Hx     Current Outpatient Medications (Endocrine & Metabolic):  .  glipiZIDE (GLUCOTROL XL) 2.5 MG 24 hr tablet, TAKE 1 TABLET BY MOUTH DAILY WITH BREAKFAST.  Current Outpatient Medications (Cardiovascular):  .  atorvastatin (LIPITOR) 80 MG tablet, Take 1 tablet (80 mg total) by mouth daily. Marland Kitchen  diltiazem (CARDIZEM CD) 180 MG 24 hr capsule, TAKE 1 CAPSULE (180 MG TOTAL) BY MOUTH DAILY. .   furosemide (LASIX) 20 MG tablet, TAKE 3 TABLETS (60 MG TOTAL) BY MOUTH 2 TIMES DAILY. .  metoprolol tartrate (LOPRESSOR) 50 MG tablet, TAKE 1 TABLET BY MOUTH TWICE A DAY  Current Outpatient Medications (Respiratory):  .  albuterol (ACCUNEB) 0.63 MG/3ML nebulizer solution, INHALE 1 VIAL BY MOUTH VIA NEBULIZATION EVERY 6 HOURS AS NEEDED FOR WHEEZING. Marland Kitchen  albuterol (PROAIR HFA) 108 (90 Base) MCG/ACT inhaler, Inhale 2 puffs into the lungs 2 (two) times daily. .  cetirizine (ZYRTEC) 10 MG tablet, TAKE 1 TABLET (10 MG TOTAL) BY MOUTH DAILY as needed .  triamcinolone (NASACORT AQ) 55 MCG/ACT AERO nasal inhaler, Place 2 sprays into the nose daily. .  VENTOLIN HFA 108 (90 Base) MCG/ACT inhaler, TAKE 2 PUFFS BY MOUTH TWICE A DAY  Current Outpatient Medications (Analgesics):  .  allopurinol (ZYLOPRIM) 100 MG tablet, Take 1 tablet (100 mg total) by mouth daily. Marland Kitchen  aspirin 81 MG tablet, Take 162 mg by mouth daily.  .  meloxicam (MOBIC) 15 MG tablet, TAKE 1 TABLET (15 MG TOTAL) BY MOUTH DAILY AS NEEDED FOR PAIN. .  traMADol (ULTRAM) 50 MG tablet, Take 1 tablet (50 mg total) by mouth every 6 (six) hours as needed.   Current Outpatient Medications (Other):  .  ammonium lactate (AMLACTIN) 12 % cream, APPLY TOPICALLY AS NEEDED FOR DRY SKIN. Marland Kitchen  Blood Glucose Monitoring Suppl (ACCU-CHEK AVIVA PLUS) w/Device KIT, Use to check blood sugar three times daily .  Continuous Blood Gluc Receiver (FREESTYLE LIBRE READER) DEVI, Apply 1 Device topically 4 (four) times daily as needed. E11.9 .  Continuous Blood Gluc Sensor (FREESTYLE LIBRE 14 DAY SENSOR) MISC, Apply 1 Device topically every 14 (fourteen) days. E11.9 .  gabapentin (NEURONTIN) 300 MG capsule, TAKE 1 TO 2 CAPSULES BY MOUTH AT BEDTIME .  glucose blood (ACCU-CHEK AVIVA PLUS) test strip, Use to check blood sugar three times daily .  lidocaine (LIDODERM) 5 %, Place 1 patch onto the skin daily. Remove & Discard patch within 12 hours or as directed by MD .   meclizine (ANTIVERT) 25 MG tablet, TAKE 1 TABLET BY MOUTH THREE TIMES A DAY .  NON FORMULARY, [place]  CREAMS- #11 (PERIPHERAL NEUROPATHY CREAM) .  ONETOUCH DELICA PLUS LANCETS MISC, Apply 1 Device topically daily. Use to check lood sugar once daily E11.9 .  pantoprazole (PROTONIX) 40 MG tablet, TAKE 1 TABLET BY MOUTH EVERY DAY .  tiZANidine (ZANAFLEX) 2 MG tablet, Take 1 tablet (2 mg  total) by mouth every 6 (six) hours as needed for muscle spasms.    Past medical history, social, surgical and family history all reviewed in electronic medical record.  Ingram pertanent information unless stated regarding to the chief complaint.   Review of Systems:  Ingram headache, visual changes, nausea, vomiting, diarrhea, constipation, dizziness, abdominal pain, skin rash, fevers, chills, night sweats, weight loss, swollen lymph nodes, muscle aches, chest pain, shortness of breath, mood changes.  Positive muscle aches, joint swelling, body aches  Objective  Blood pressure 128/72, pulse 78, height 5' (1.524 m), SpO2 96 %.    General: Ingram apparent distress alert and oriented x3 mood and affect normal, dressed appropriately.  Morbidly obese HEENT: Pupils equal, extraocular movements intact  Respiratory: Patient's speak in full sentences and does not appear short of breath  Cardiovascular: 3+ lower extremity edema, non tender, Ingram erythema hemosiderin deposits of the legs bilaterally Skin: Warm dry intact with Ingram signs of infection or rash on extremities or on axial skeleton.  Abdomen: Soft nontender  Neuro: Cranial nerves II through XII are intact, neurovascularly intact in all extremities with 2+ DTRs and 2+ pulses.  Lymph: Ingram lymphadenopathy of posterior or anterior cervical chain or axillae bilaterally.  Gait patient is in a rolling walker MSK:    Left hip exam shows the patient has significant difficulty with walking.  Pain seems to be over the lateral aspect of the hip.  Negative straight leg test.   Difficult to assess the rest of exam secondary to patient's body habitus   Knee: Left valgus deformity noted. Large thigh to calf ratio.  Tender to palpation over medial and PF joint line.  ROM full in flexion and extension and lower leg rotation. instability with valgus force.  painful patellar compression. Patellar glide with moderate crepitus. Patellar and quadriceps tendons unremarkable. Hamstring and quadriceps strength is normal. Contralateral knee shows knee replacement  After informed written and verbal consent, patient was seated on exam table. Left knee was prepped with alcohol swab and utilizing anterolateral approach, patient's left knee space was injected with 4:1  marcaine 0.5%: Kenalog 96m/dL. Patient tolerated the procedure well without immediate complications.  After verbal consent patient was prepped with alcohol swabs and with a 21-gauge 2 inch needle injected into the left greater trochanteric area and for total of 2 cc of 0.5% Marcaine and 1 cc of Kenalog 40 mg/mL.  Ingram blood loss.  Band-Aid placed.  Postinjection instructions given.   Impression and Recommendations:     This case required medical decision making of moderate complexity. The above documentation has been reviewed and is accurate and complete ZLyndal Pulley DO       Note: This dictation was prepared with Dragon dictation along with smaller phrase technology. Any transcriptional errors that result from this process are unintentional.

## 2019-03-04 NOTE — Assessment & Plan Note (Addendum)
Repeat injection given today.  Tolerated the procedure very well.  We discussed which activities are doing which wants to avoid.  Discussed icing regimen and home exercises.  Patient will increase activity as tolerated.  Follow-up again for 8 weeks

## 2019-03-04 NOTE — Patient Instructions (Addendum)
You had the flu shot today  OK to try the Melatonin 3 mg at bedtime  Please continue all other medications as before, and refills have been done if requested.  Please have the pharmacy call with any other refills you may need.  Please continue your efforts at being more active, low cholesterol diet, and weight control.  You are otherwise up to date with prevention measures today.  Please keep your appointments with your specialists as you may have planned  Please consider Dr Gershon Crane for eye care (or [facility])  Please return in 3 months, or sooner if needed, with Lab testing done 3-5 days before if possible

## 2019-03-09 DIAGNOSIS — G4700 Insomnia, unspecified: Secondary | ICD-10-CM | POA: Insufficient documentation

## 2019-03-09 NOTE — Assessment & Plan Note (Signed)
stable overall by history and exam, recent data reviewed with pt, and pt to continue medical treatment as before,  to f/u any worsening symptoms or concerns, cont statin 

## 2019-03-09 NOTE — Assessment & Plan Note (Signed)
stable overall by history and exam, recent data reviewed with pt, and pt to continue medical treatment as before,  to f/u any worsening symptoms or concerns  

## 2019-03-09 NOTE — Assessment & Plan Note (Addendum)
stable overall by history and exam, recent data reviewed with pt, and pt to continue medical treatment as before,  to f/u any worsening symptoms or concerns, also for optho referral

## 2019-03-09 NOTE — Assessment & Plan Note (Signed)
[facility] for melatonin asd,  to f/u any worsening symptoms or concerns

## 2019-03-19 ENCOUNTER — Other Ambulatory Visit: Payer: Self-pay | Admitting: Internal Medicine

## 2019-03-19 ENCOUNTER — Telehealth: Payer: Self-pay | Admitting: Internal Medicine

## 2019-03-19 NOTE — Telephone Encounter (Signed)
Medication Refill - Medication: one touch monitor and test strips  Has the patient contacted their pharmacy? Yes.   (Agent: If no, request that the patient contact the pharmacy for the refill.) (Agent: If yes, when and what did the pharmacy advise?)  Preferred Pharmacy (with phone number or street name): [id number] - [place], [place]  Agent: Please be advised that RX refills may take up to 3 business days. We ask that you follow-up with your pharmacy.

## 2019-03-24 ENCOUNTER — Ambulatory Visit: Payer: Medicare Other

## 2019-03-29 ENCOUNTER — Other Ambulatory Visit: Payer: Self-pay | Admitting: Internal Medicine

## 2019-04-01 ENCOUNTER — Ambulatory Visit: Payer: Medicare Other | Admitting: Podiatry

## 2019-04-03 ENCOUNTER — Telehealth: Payer: Self-pay | Admitting: Internal Medicine

## 2019-04-03 MED ORDER — ONETOUCH VERIO W/DEVICE KIT: PACK | 0 refills | Status: DC

## 2019-04-03 MED ORDER — GLUCOSE BLOOD VI STRP: ORAL_STRIP | 12 refills | Status: DC

## 2019-04-03 NOTE — Telephone Encounter (Signed)
Lancets and the meter sent into the pharmacy for [facility]. Please advise.    [id number] [address], [place] [address]  Phone: [phone] Fax: [phone]  Not a 24 hour pharmacy; exact hours not known.

## 2019-04-03 NOTE — Addendum Note (Signed)
Addended by: [name] on: 04/03/2019 05:19 PM   Modules accepted: Orders

## 2019-04-03 NOTE — Telephone Encounter (Signed)
Lancets and the meter sent into the pharmacy for [name]. Please advise

## 2019-04-29 ENCOUNTER — Other Ambulatory Visit: Payer: Self-pay | Admitting: Internal Medicine

## 2019-05-06 ENCOUNTER — Ambulatory Visit: Payer: Medicare Other | Admitting: Family Medicine

## 2019-05-09 ENCOUNTER — Other Ambulatory Visit: Payer: Self-pay | Admitting: Internal Medicine

## 2019-05-11 NOTE — Telephone Encounter (Signed)
Per routine office policy  All routine meds ok for 1 yr, then month to month only for 3 mo until refused after, thanks 

## 2019-05-13 ENCOUNTER — Ambulatory Visit: Payer: Medicare Other

## 2019-06-17 ENCOUNTER — Encounter: Payer: Self-pay | Admitting: Family Medicine

## 2019-06-17 ENCOUNTER — Other Ambulatory Visit: Payer: Self-pay

## 2019-06-17 ENCOUNTER — Ambulatory Visit (INDEPENDENT_AMBULATORY_CARE_PROVIDER_SITE_OTHER): Payer: Medicare Other | Admitting: Family Medicine

## 2019-06-17 ENCOUNTER — Ambulatory Visit (INDEPENDENT_AMBULATORY_CARE_PROVIDER_SITE_OTHER): Payer: Medicare Other | Admitting: Internal Medicine

## 2019-06-17 ENCOUNTER — Encounter: Payer: Self-pay | Admitting: Internal Medicine

## 2019-06-17 VITALS — BP 132/76 | HR 59 | Temp 97.7°F | Ht 61.0 in | Wt 221.0 lb

## 2019-06-17 DIAGNOSIS — E119 Type 2 diabetes mellitus without complications: Secondary | ICD-10-CM | POA: Diagnosis not present

## 2019-06-17 DIAGNOSIS — R6889 Other general symptoms and signs: Secondary | ICD-10-CM | POA: Diagnosis not present

## 2019-06-17 DIAGNOSIS — M7062 Trochanteric bursitis, left hip: Secondary | ICD-10-CM

## 2019-06-17 DIAGNOSIS — M1712 Unilateral primary osteoarthritis, left knee: Secondary | ICD-10-CM | POA: Diagnosis not present

## 2019-06-17 DIAGNOSIS — Z0001 Encounter for general adult medical examination with abnormal findings: Secondary | ICD-10-CM

## 2019-06-17 DIAGNOSIS — Z0000 Encounter for general adult medical examination without abnormal findings: Secondary | ICD-10-CM | POA: Diagnosis not present

## 2019-06-17 DIAGNOSIS — Z1231 Encounter for screening mammogram for malignant neoplasm of breast: Secondary | ICD-10-CM

## 2019-06-17 DIAGNOSIS — I509 Heart failure, unspecified: Secondary | ICD-10-CM | POA: Diagnosis not present

## 2019-06-17 LAB — CBC WITH DIFFERENTIAL/PLATELET
Basophils Absolute: 0 10*3/uL (ref 0.0–0.1)
Basophils Relative: 0.2 % (ref 0.0–3.0)
Eosinophils Absolute: 0.2 10*3/uL (ref 0.0–0.7)
Eosinophils Relative: 3 % (ref 0.0–5.0)
HCT: 37 % (ref 36.0–46.0)
Hemoglobin: 11.8 g/dL — ABNORMAL LOW (ref 12.0–15.0)
Lymphocytes Relative: 30.4 % (ref 12.0–46.0)
Lymphs Abs: 1.8 10*3/uL (ref 0.7–4.0)
MCHC: 31.9 g/dL (ref 30.0–36.0)
MCV: 87.8 fl (ref 78.0–100.0)
Monocytes Absolute: 0.5 10*3/uL (ref 0.1–1.0)
Monocytes Relative: 8.3 % (ref 3.0–12.0)
Neutro Abs: 3.4 10*3/uL (ref 1.4–7.7)
Neutrophils Relative %: 58.1 % (ref 43.0–77.0)
Platelets: 248 10*3/uL (ref 150.0–400.0)
RBC: 4.22 Mil/uL (ref 3.87–5.11)
RDW: 15.8 % — ABNORMAL HIGH (ref 11.5–15.5)
WBC: 5.9 10*3/uL (ref 4.0–10.5)

## 2019-06-17 LAB — HEMOGLOBIN A1C: Hgb A1c MFr Bld: 6.8 % — ABNORMAL HIGH (ref 4.6–6.5)

## 2019-06-17 NOTE — Assessment & Plan Note (Signed)
Repeat injection given again today, tolerated procedure well, discussed icing regimen with home exercise, which activities to do which wants to avoid.  Patient will increase activity slowly over the course the next several weeks.  Follow-up again in 10 weeks.  Chronic problem with exacerbation social determinants including difficulty with transportation and multiple comorbidities

## 2019-06-17 NOTE — Assessment & Plan Note (Signed)
For cards f/u

## 2019-06-17 NOTE — Progress Notes (Signed)
[name] [address] [place] Phone: [phone] Subjective:   I Alyssa Ingram am serving as a Education administrator for Dr. Hulan Saas.  This visit occurred during the SARS-CoV-2 public health emergency.  Safety protocols were in place, including screening questions prior to the visit, additional usage of staff PPE, and extensive cleaning of exam room while observing appropriate contact time as indicated for disinfecting solutions.   I'm seeing this patient by the request  of:  [name], MD  CC: Left knee pain, left hip pain  [id number]   03/04/2019 Repeat injection given today.  Tolerated the procedure well.  We discussed icing regimen and home exercise, which activities to emotions to avoid.  Can repeat injections every 10 weeks if necessary.  Repeat injection given today.  Tolerated the procedure very well.  We discussed which activities are doing which wants to avoid.  Discussed icing regimen and home exercises.  Patient will increase activity as tolerated.  Follow-up again for 8 weeks  Update 06/17/2019 Alyssa Ingram is a 74 y.o. female coming in with complaint of left hip and left knee pain. Patient states she would like injections.  We will send patient for over 3 months.  Started having increasing discomfort and difficulty with walking again.  Has had this problem intermittently for quite some time. 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  Affecting daily activities.    Past Medical History:  Diagnosis Date  . Abnormal MRI, spine 11/2011   ?discitis L5-S1, s/p CT bx 12/12/11  . Allergic rhinitis, cause unspecified 01/31/2013  . Arthritis   . Asthma   . Cervical cancer ([place])   . Chronic diastolic heart failure ([place])   . Diabetic retinopathy ([place])   . GERD (gastroesophageal reflux disease)   . H/O cardiovascular stress test    Nuclear study in 2011 normal perfusion  . 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 Hyperlipidemia   . Hypertension   . Kidney stones   . Obesity   . Type II or unspecified type diabetes mellitus without mention of complication, uncontrolled 01/31/2013   Past Surgical History:  Procedure Laterality Date  . ABDOMINAL HYSTERECTOMY    . JOINT REPLACEMENT     right knee 2006  . rotater cuff     right, 2005   Social History   Socioeconomic History  . Marital status: Widowed    Spouse name: Not on file  . Number of children: 5  . Years of education: 10  . Highest education level: Not on file  Occupational History  . Occupation: Retired Optometrist  Tobacco Use  . Smoking status: Never Smoker  . Smokeless tobacco: Never Used  Substance and Sexual Activity  . Alcohol use: No    Alcohol/week: 0.0 standard drinks  . Drug use: No  . Sexual activity: Not on file  Other Topics Concern  . Not on file  Social History Narrative  . Not on file   Social Determinants of Health   Financial Resource Strain:   . Difficulty of Paying Living Expenses: Not on file  Food Insecurity:   . Worried About Charity fundraiser in the Last Year: Not on file  . Ran Out of Food in the Last  Year: Not on file  Transportation Needs:   . Lack of Transportation (Medical): Not on file  . Lack of Transportation (Non-Medical): Not on file  Physical Activity:   . Days of Exercise per Week: Not on file  . Minutes of Exercise per Session: Not on file  Stress:   . Feeling of Stress : Not on file  Social Connections:   . Frequency of Communication with Friends and Family: Not on file  . Frequency of Social Gatherings with Friends and Family: Not on file  . Attends Religious Services: Not on file  . Active Member of Clubs or Organizations: Not on file  . Attends Theatre manager Meetings: Not on file  . Marital Status: Not on file   Allergies  Allergen Reactions  . Sulfa Antibiotics Hives  . Metformin And Related   . Other Hives    Unknown drug  . Prednisone    Family History  Problem Relation Age of Onset  . Heart disease Father   . Arthritis Other   . Heart disease Other   . Hypertension Other   . Diabetes Other   . Alcohol abuse Other   . Arthritis Other   . Cancer Other        Lung Cancer  . Heart disease Other   . Hypertension Other   . Sudden death Other   . Kidney disease Other   . Mental illness Other   . Diabetes Other   . Colon cancer Neg Hx     Current Outpatient Medications (Endocrine & Metabolic):  .  glipiZIDE (GLUCOTROL XL) 2.5 MG 24 hr tablet, TAKE 1 TABLET BY MOUTH DAILY WITH BREAKFAST.  Current Outpatient Medications (Cardiovascular):  .  atorvastatin (LIPITOR) 80 MG tablet, Take 1 tablet (80 mg total) by mouth daily. Marland Kitchen  diltiazem (CARDIZEM CD) 180 MG 24 hr capsule, TAKE 1 CAPSULE (180 MG TOTAL) BY MOUTH DAILY. .  furosemide (LASIX) 20 MG tablet, TAKE 3 TABLETS (60 MG TOTAL) BY MOUTH 2 TIMES DAILY. .  metoprolol tartrate (LOPRESSOR) 50 MG tablet, TAKE 1 TABLET BY MOUTH TWICE A DAY  Current Outpatient Medications (Respiratory):  .  albuterol (ACCUNEB) 0.63 MG/3ML nebulizer solution, INHALE 1 VIAL BY MOUTH VIA NEBULIZATION EVERY 6 HOURS AS NEEDED FOR WHEEZING. Marland Kitchen  albuterol (PROAIR HFA) 108 (90 Base) MCG/ACT inhaler, Inhale 2 puffs into the lungs 2 (two) times daily. .  cetirizine (ZYRTEC) 10 MG tablet, TAKE 1 TABLET (10 MG TOTAL) BY MOUTH DAILY as needed .  triamcinolone (NASACORT AQ) 55 MCG/ACT AERO nasal inhaler, Place 2 sprays into the nose daily. .  VENTOLIN HFA 108 (90 Base) MCG/ACT inhaler, TAKE 2 PUFFS BY MOUTH TWICE A DAY  Current Outpatient Medications (Analgesics):  .  allopurinol (ZYLOPRIM) 100 MG tablet, Take 1 tablet (100 mg total) by mouth daily. Marland Kitchen  aspirin 81 MG tablet, Take 162 mg by mouth daily.    .  meloxicam (MOBIC) 15 MG tablet, TAKE 1 TABLET (15 MG TOTAL) BY MOUTH DAILY AS NEEDED FOR PAIN. .  traMADol (ULTRAM) 50 MG tablet, Take 1 tablet (50 mg total) by mouth every 6 (six) hours as needed.   Current Outpatient Medications (Other):  .  ammonium lactate (AMLACTIN) 12 % cream, APPLY TOPICALLY AS NEEDED FOR DRY SKIN. Marland Kitchen  Blood Glucose Monitoring Suppl (ONETOUCH VERIO) w/Device KIT, Use as directed once daily E11.9 .  Continuous Blood Gluc Receiver (FREESTYLE LIBRE READER) DEVI, Apply 1 Device topically 4 (four) times daily as needed. E11.9 .  Continuous Blood Gluc Sensor (FREESTYLE LIBRE 14 DAY SENSOR) MISC, Apply 1 Device topically every 14 (fourteen) days. E11.9 .  gabapentin (NEURONTIN) 300 MG capsule, TAKE 1 TO 2 CAPSULES BY MOUTH AT BEDTIME .  glucose blood test strip, Use as instructed once daily E11.9 .  lidocaine (LIDODERM) 5 %, Place 1 patch onto the skin daily. Remove & Discard patch within 12 hours or as directed by MD .  meclizine (ANTIVERT) 25 MG tablet, TAKE 1 TABLET BY MOUTH THREE TIMES A DAY .  NON FORMULARY, [facility]  CREAMS- #11 (PERIPHERAL NEUROPATHY CREAM) .  ONETOUCH DELICA PLUS LANCETS MISC, Apply 1 Device topically daily. Use to check lood sugar once daily E11.9 .  pantoprazole (PROTONIX) 40 MG tablet, TAKE 1 TABLET BY MOUTH EVERY DAY .  tiZANidine (ZANAFLEX) 2 MG tablet, Take 1 tablet (2 mg total) by mouth every 6 (six) hours as needed for muscle spasms.   Reviewed prior external information including notes and imaging from  primary care provider As well as notes that were available from care everywhere and other healthcare systems.  Past medical history, social, surgical and family history all reviewed in electronic medical record.  No pertanent information unless stated regarding to the chief complaint.   Review of Systems:  No headache, visual changes, nausea, vomiting, diarrhea, constipation, dizziness, abdominal pain, skin rash, fevers,  chills, night sweats, weight loss, swollen lymph nodes, body aches, joint swelling, chest pain, shortness of breath, mood changes. POSITIVE muscle aches  Objective  Blood pressure 140/90, pulse 66, height 5' (1.524 m), SpO2 95 %.   General: No apparent distress alert and oriented x3 mood and affect normal, dressed appropriately.  HEENT: Pupils equal, extraocular movements intact  Respiratory: Patient's speak in full sentences and does not appear short of breath  Cardiovascular: 3+ pitting edema in the lower extremities bilaterally Skin: Warm dry intact with no signs of infection or rash on extremities or on axial skeleton.  Abdomen: Soft nontender  Neuro: Cranial nerves II through XII are intact, neurovascularly intact in all extremities with 2+ DTRs and 2+ pulses.  Lymph: No lymphadenopathy of posterior or anterior cervical chain or axillae bilaterally.  Gait severely antalgic using the aid of a walker MSK: Significant arthritic changes of multiple joints Knee: Left valgus deformity noted. Large thigh to calf ratio.  Tender to palpation over medial and PF joint line.  Flexion of only 85 degrees and lacks last 10 degrees of extension. instability with valgus force.  painful patellar compression. Patellar glide with moderate crepitus. Patellar and quadriceps tendons unremarkable. Hamstring and quadriceps strength is normal.   Left hip does have severe tenderness over the greater trochanteric area.  Due to patient's body habitus having difficulty with movements unable to fully evaluate the hip today.  After informed written and verbal consent, patient was seated on exam table. Left knee was prepped with alcohol swab and utilizing anterolateral approach, patient's left knee space was injected with 4:1  marcaine 0.5%: Kenalog 67m/dL. Patient tolerated the procedure well without immediate complications.  After verbal consent patient was seated.  With an alcohol swab patient was prepped and  then with a 22-gauge 2 inch needle was injected into the left greater trochanteric area with a total of 0.5 cc of 0.5% Marcaine and 1 cc of Kenalog 40 mg/mL    Impression and Recommendations:     This case required medical decision making of moderate complexity. The above documentation has been reviewed and is accurate and complete ZAlroy Dust  Koren Bound, DO       Note: This dictation was prepared with Dragon dictation along with smaller phrase technology. Any transcriptional errors that result from this process are unintentional.

## 2019-06-17 NOTE — Assessment & Plan Note (Signed)
stable overall by history and exam, recent data reviewed with pt, and pt to continue medical treatment as before,  to f/u any worsening symptoms or concerns  

## 2019-06-17 NOTE — Patient Instructions (Signed)
You are on the [facility] Vaccine Wait List  Please continue all other medications as before, and refills have been done if requested.  Please have the pharmacy call with any other refills you may need.  Please continue your efforts at being more active, low cholesterol diet, and weight control.  You are otherwise up to date with prevention measures today.  Please keep your appointments with your specialists as you may have planned  You will be contacted regarding the referral for: mammogram, OB/GYN, cardiology and eye doctor  Please go to the LAB at the blood drawing area for the tests to be done  You will be contacted by phone if any changes need to be made immediately.  Otherwise, you will receive a letter about your results with an explanation, but please check with MyChart first.  Please remember to sign up for MyChart if you have not done so, as this will be important to you in the future with finding out test results, communicating by private email, and scheduling acute appointments online when needed.  Please make an Appointment to return in 6 months, or sooner if needed

## 2019-06-17 NOTE — Patient Instructions (Signed)
See me again in 10weeks 

## 2019-06-17 NOTE — Assessment & Plan Note (Signed)

## 2019-06-17 NOTE — Assessment & Plan Note (Signed)
Repeat injection given again today.  Tolerated procedure well.  Discussed that we can repeat every 10 weeks if necessary.  Patient will continue to stay active otherwise.  Patient does walk with the aid of a walker.  Follow-up again in 10 weeks chronic problem with exacerbation patient has had comorbidities as well as social determinants including difficulty with transportation.

## 2019-06-17 NOTE — Progress Notes (Addendum)
Subjective:    Patient ID: Alyssa Ingram, female    DOB: 08/10/1945, 74 y.o.   MRN: [id number]  HPI  Here for wellness and f/u;  Overall doing ok;  Pt denies Chest pain, worsening SOB, DOE, wheezing, orthopnea, PND, worsening LE edema, palpitations, dizziness or syncope.  Pt denies neurological change such as new headache, facial or extremity weakness.  Pt denies polydipsia, polyuria, or low sugar symptoms. Pt states overall good compliance with treatment and medications, good tolerability, and has been trying to follow appropriate diet.  Pt denies worsening depressive symptoms, suicidal ideation or panic. No fever, night sweats, wt loss, loss of appetite, or other constitutional symptoms.  Pt states good ability with ADL's, has low fall risk, home safety reviewed and adequate, no other significant changes in hearing or vision, and only occasionally active with exercise.  No new complaints Past Medical History:  Diagnosis Date  . Abnormal MRI, spine 11/2011   ?discitis L5-S1, s/p CT bx 12/12/11  . Allergic rhinitis, cause unspecified 01/31/2013  . Arthritis   . Asthma   . Cervical cancer ([place])   . Chronic diastolic heart failure ([place])   . Diabetic retinopathy ([place])   . GERD (gastroesophageal reflux disease)   . H/O cardiovascular stress test    Nuclear study in 2011 normal perfusion  . HOCM (hypertrophic obstructive cardiomyopathy) ([place])    Echo 12/19/11: Severe LVH, EF 55-65%, dynamic obstruction, wall motion normal, grade 1 diastolic dysfunction, systolic anterior motion of the mitral valve with mild MS and mild MR, mild LAE, PASP 34  . Hyperlipidemia   . Hypertension   . Kidney stones   . Obesity   . Type II or unspecified type diabetes mellitus without mention of complication, uncontrolled 01/31/2013   Past Surgical History:  Procedure Laterality Date  . ABDOMINAL HYSTERECTOMY    . JOINT REPLACEMENT     right knee 2006  . rotater cuff     right, 2005    reports that she has never  smoked. She has never used smokeless tobacco. She reports that she does not drink alcohol or use drugs. family history includes Alcohol abuse in an other family member; Arthritis in some other family members; Cancer in an other family member; Diabetes in some other family members; Heart disease in her father and other family members; Hypertension in some other family members; Kidney disease in an other family member; Mental illness in an other family member; Sudden death in an other family member. Allergies  Allergen Reactions  . Sulfa Antibiotics Hives  . Metformin And Related   . Other Hives    Unknown drug  . Prednisone    Current Outpatient Medications on File Prior to Visit  Medication Sig Dispense Refill  . albuterol (PROAIR HFA) 108 (90 Base) MCG/ACT inhaler Inhale 2 puffs into the lungs 2 (two) times daily. 8.5 Inhaler 2  . allopurinol (ZYLOPRIM) 100 MG tablet Take 1 tablet (100 mg total) by mouth daily. 90 tablet 3  . ammonium lactate (AMLACTIN) 12 % cream APPLY TOPICALLY AS NEEDED FOR DRY SKIN. 385 g 0  . aspirin 81 MG tablet Take 162 mg by mouth daily.     Marland Kitchen atorvastatin (LIPITOR) 80 MG tablet Take 1 tablet (80 mg total) by mouth daily. 90 tablet 3  . Blood Glucose Monitoring Suppl (ONETOUCH VERIO) w/Device KIT Use as directed once daily E11.9 1 kit 0  . cetirizine (ZYRTEC) 10 MG tablet TAKE 1 TABLET (10 MG TOTAL) BY MOUTH DAILY  as needed 90 tablet 3  . Continuous Blood Gluc Receiver (FREESTYLE LIBRE READER) DEVI Apply 1 Device topically 4 (four) times daily as needed. E11.9 1 Device 0  . Continuous Blood Gluc Sensor (FREESTYLE LIBRE 14 DAY SENSOR) MISC Apply 1 Device topically every 14 (fourteen) days. E11.9 6 each 3  . diltiazem (CARDIZEM CD) 180 MG 24 hr capsule TAKE 1 CAPSULE (180 MG TOTAL) BY MOUTH DAILY. 90 capsule 1  . furosemide (LASIX) 20 MG tablet TAKE 3 TABLETS (60 MG TOTAL) BY MOUTH 2 TIMES DAILY. 180 tablet 1  . gabapentin (NEURONTIN) 300 MG capsule TAKE 1 TO 2 CAPSULES  BY MOUTH AT BEDTIME 180 capsule 1  . NON FORMULARY [facility]  CREAMS- #11 (PERIPHERAL NEUROPATHY CREAM)    . ONETOUCH DELICA PLUS LANCETS MISC Apply 1 Device topically daily. Use to check lood sugar once daily E11.9 100 each 3  . pantoprazole (PROTONIX) 40 MG tablet TAKE 1 TABLET BY MOUTH EVERY DAY 90 tablet 3  . tiZANidine (ZANAFLEX) 2 MG tablet Take 1 tablet (2 mg total) by mouth every 6 (six) hours as needed for muscle spasms. 40 tablet 1  . traMADol (ULTRAM) 50 MG tablet Take 1 tablet (50 mg total) by mouth every 6 (six) hours as needed. 30 tablet 0  . triamcinolone (NASACORT AQ) 55 MCG/ACT AERO nasal inhaler Place 2 sprays into the nose daily. 3 Inhaler 3  . VENTOLIN HFA 108 (90 Base) MCG/ACT inhaler TAKE 2 PUFFS BY MOUTH TWICE A DAY 18 Inhaler 2  . albuterol (ACCUNEB) 0.63 MG/3ML nebulizer solution INHALE 1 VIAL BY MOUTH VIA NEBULIZATION EVERY 6 HOURS AS NEEDED FOR WHEEZING. 75 mL 12  . glipiZIDE (GLUCOTROL XL) 2.5 MG 24 hr tablet TAKE 1 TABLET BY MOUTH DAILY WITH BREAKFAST. 90 tablet 3  . glucose blood test strip Use as instructed once daily E11.9 100 each 12  . lidocaine (LIDODERM) 5 % Place 1 patch onto the skin daily. Remove & Discard patch within 12 hours or as directed by MD 40 patch 1  . meclizine (ANTIVERT) 25 MG tablet TAKE 1 TABLET BY MOUTH THREE TIMES A DAY 30 tablet 5  . meloxicam (MOBIC) 15 MG tablet TAKE 1 TABLET (15 MG TOTAL) BY MOUTH DAILY AS NEEDED FOR PAIN. 90 tablet 1  . metoprolol tartrate (LOPRESSOR) 50 MG tablet TAKE 1 TABLET BY MOUTH TWICE A DAY 180 tablet 3   No current facility-administered medications on file prior to visit.   Review of Systems All otherwise neg per pt     Objective:   Physical Exam BP 132/76 (BP Location: Left Arm, Patient Position: Sitting, Cuff Size: Normal)   Pulse (!) 59   Temp 97.7 F (36.5 C) (Oral)   Ht _0  (1.549 m)   Wt 221 lb (100.2 kg)   SpO2 95%   BMI 41.76 kg/m  VS noted,  Constitutional: Pt appears in NAD  HENT: Head: NCAT.  Right Ear: External ear normal.  Left Ear: External ear normal.  Eyes: . Pupils are equal, round, and reactive to light. Conjunctivae and EOM are normal Nose: without d/c or deformity Neck: Neck supple. Gross normal ROM Cardiovascular: Normal rate and regular rhythm.   Pulmonary/Chest: Effort normal and breath sounds without rales or wheezing.  Abd:  Soft, NT, ND, + BS, no organomegaly Neurological: Pt is alert. At baseline orientation, motor grossly intact Skin: Skin is warm. No rashes, other new lesions, no LE edema Psychiatric: Pt behavior is normal without agitation  All  otherwise neg per pt Lab Results  Component Value Date   WBC 5.4 07/17/2018   HGB 12.5 07/17/2018   HCT 38.6 07/17/2018   PLT 266.0 07/17/2018   GLUCOSE 89 07/17/2018   CHOL 206 (H) 12/31/2018   TRIG 106.0 12/31/2018   HDL 55.60 12/31/2018   LDLCALC 129 (H) 12/31/2018   ALT 8 12/31/2018   AST 14 12/31/2018   NA 140 07/17/2018   K 3.9 07/17/2018   CL 102 07/17/2018   CREATININE 0.83 07/17/2018   BUN 21 07/17/2018   CO2 31 07/17/2018   TSH 0.38 07/17/2018   INR 1.03 12/12/2011   HGBA1C 6.1 07/17/2018   MICROALBUR 4.5 (H) 06/20/2017         Assessment & Plan:

## 2019-06-18 ENCOUNTER — Encounter: Payer: Self-pay | Admitting: Internal Medicine

## 2019-06-18 ENCOUNTER — Encounter: Payer: Self-pay | Admitting: Family Medicine

## 2019-06-18 LAB — LIPID PANEL
Cholesterol: 147 mg/dL (ref 0–200)
HDL: 50.7 mg/dL (ref >39.00)
LDL Cholesterol: 78 mg/dL (ref 0–99)
NonHDL: 96.77
Total CHOL/HDL Ratio: 3
Triglycerides: 93 mg/dL (ref 0.0–149.0)
VLDL: 18.6 mg/dL (ref 0.0–40.0)

## 2019-06-18 LAB — HEPATIC FUNCTION PANEL
ALT: 11 U/L (ref 0–35)
AST: 16 U/L (ref 0–37)
Albumin: 4.1 g/dL (ref 3.5–5.2)
Alkaline Phosphatase: 99 U/L (ref 39–117)
Bilirubin, Direct: 0.1 mg/dL (ref 0.0–0.3)
Total Bilirubin: 0.5 mg/dL (ref 0.2–1.2)
Total Protein: 7.8 g/dL (ref 6.0–8.3)

## 2019-06-18 LAB — BASIC METABOLIC PANEL
BUN: 21 mg/dL (ref 6–23)
CO2: 31 mEq/L (ref 19–32)
Calcium: 9.8 mg/dL (ref 8.4–10.5)
Chloride: 104 mEq/L (ref 96–112)
Creatinine, Ser: 0.95 mg/dL (ref 0.40–1.20)
GFR: 57.54 mL/min — ABNORMAL LOW (ref >60.00)
Glucose, Bld: 92 mg/dL (ref 70–99)
Potassium: 4.3 mEq/L (ref 3.5–5.1)
Sodium: 142 mEq/L (ref 135–145)

## 2019-06-18 LAB — TSH: TSH: 0.39 u[IU]/mL (ref 0.35–4.50)

## 2019-06-19 ENCOUNTER — Encounter: Payer: Self-pay | Admitting: Internal Medicine

## 2019-06-23 NOTE — Progress Notes (Deleted)
HPI: FU HCM. Patient previously lived in [place]. Records from there include: Nuclear study in November of 2011 showed an ejection fraction of 62% and normal perfusion. Exercise treadmill February 2014 showed poor exercise capacity and study was technically difficult. However no hypotension noted. Carotid Dopplers January 2014 showed no significant carotid disease. 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 Echocardiogram May 2018 showed vigorous LV systolic function, asymmetric septal hypertrophy, mild LVOT gradient of 17 mmHg, grade 1 diastolic dysfunction, mild mitral stenosis and mitral regurgitation, moderate to severe left atrial enlargement. Since last seen,   Current Outpatient Medications  Medication Sig Dispense Refill  . albuterol (ACCUNEB) 0.63 MG/3ML nebulizer solution INHALE 1 VIAL BY MOUTH VIA NEBULIZATION EVERY 6 HOURS AS NEEDED FOR WHEEZING. 75 mL 12  . albuterol (PROAIR HFA) 108 (90 Base) MCG/ACT inhaler Inhale 2 puffs into the lungs 2 (two) times daily. 8.5 Inhaler 2  . allopurinol (ZYLOPRIM) 100 MG tablet Take 1 tablet (100 mg total) by mouth daily. 90 tablet 3  . ammonium lactate (AMLACTIN) 12 % cream APPLY TOPICALLY AS NEEDED FOR DRY SKIN. 385 g 0  . aspirin 81 MG tablet Take 162 mg by mouth daily.     Marland Kitchen atorvastatin (LIPITOR) 80 MG tablet Take 1 tablet (80 mg total) by mouth daily. 90 tablet 3  . Blood Glucose Monitoring Suppl (ONETOUCH VERIO) w/Device KIT Use as directed once daily E11.9 1 kit 0  . cetirizine (ZYRTEC) 10 MG tablet TAKE 1 TABLET (10 MG TOTAL) BY MOUTH DAILY as needed 90 tablet 3  . Continuous Blood Gluc Receiver (FREESTYLE LIBRE READER) DEVI Apply 1 Device topically 4 (four) times daily as needed. E11.9 1 Device 0  . Continuous Blood Gluc Sensor (FREESTYLE LIBRE 14 DAY SENSOR) MISC Apply 1 Device topically every 14 (fourteen) days. E11.9 6 each 3  . diltiazem (CARDIZEM CD) 180 MG 24 hr capsule TAKE 1 CAPSULE (180 MG TOTAL) BY  MOUTH DAILY. 90 capsule 1  . furosemide (LASIX) 20 MG tablet TAKE 3 TABLETS (60 MG TOTAL) BY MOUTH 2 TIMES DAILY. 180 tablet 1  . gabapentin (NEURONTIN) 300 MG capsule TAKE 1 TO 2 CAPSULES BY MOUTH AT BEDTIME 180 capsule 1  . glipiZIDE (GLUCOTROL XL) 2.5 MG 24 hr tablet TAKE 1 TABLET BY MOUTH DAILY WITH BREAKFAST. 90 tablet 3  . glucose blood test strip Use as instructed once daily E11.9 100 each 12  . lidocaine (LIDODERM) 5 % Place 1 patch onto the skin daily. Remove & Discard patch within 12 hours or as directed by MD 40 patch 1  . meclizine (ANTIVERT) 25 MG tablet TAKE 1 TABLET BY MOUTH THREE TIMES A DAY 30 tablet 5  . meloxicam (MOBIC) 15 MG tablet TAKE 1 TABLET (15 MG TOTAL) BY MOUTH DAILY AS NEEDED FOR PAIN. 90 tablet 1  . metoprolol tartrate (LOPRESSOR) 50 MG tablet TAKE 1 TABLET BY MOUTH TWICE A DAY 180 tablet 3  . NON FORMULARY [place] [facility]  CREAMS- #11 (PERIPHERAL NEUROPATHY CREAM)    . ONETOUCH DELICA PLUS LANCETS MISC Apply 1 Device topically daily. Use to check lood sugar once daily E11.9 100 each 3  . pantoprazole (PROTONIX) 40 MG tablet TAKE 1 TABLET BY MOUTH EVERY DAY 90 tablet 3  . tiZANidine (ZANAFLEX) 2 MG tablet Take 1 tablet (2 mg total) by mouth every 6 (six) hours as needed for muscle spasms. 40 tablet 1  . traMADol (ULTRAM) 50 MG tablet Take 1 tablet (50  mg total) by mouth every 6 (six) hours as needed. 30 tablet 0  . triamcinolone (NASACORT AQ) 55 MCG/ACT AERO nasal inhaler Place 2 sprays into the nose daily. 3 Inhaler 3  . VENTOLIN HFA 108 (90 Base) MCG/ACT inhaler TAKE 2 PUFFS BY MOUTH TWICE A DAY 18 Inhaler 2   No current facility-administered medications for this visit.     Past Medical History:  Diagnosis Date  . Abnormal MRI, spine 11/2011   ?discitis L5-S1, s/p CT bx 12/12/11  . Allergic rhinitis, cause unspecified 01/31/2013  . Arthritis   . Asthma   . Cervical cancer ([place])   . Chronic diastolic heart failure ([place])   . Diabetic retinopathy ([place])     . GERD (gastroesophageal reflux disease)   . H/O cardiovascular stress test    Nuclear study in 2011 normal perfusion  . HOCM (hypertrophic obstructive cardiomyopathy) ([place])    Echo 12/19/11: Severe LVH, EF 55-65%, dynamic obstruction, wall motion normal, grade 1 diastolic dysfunction, systolic anterior motion of the mitral valve with mild MS and mild MR, mild LAE, PASP 34  . Hyperlipidemia   . Hypertension   . Kidney stones   . Obesity   . Type II or unspecified type diabetes mellitus without mention of complication, uncontrolled 01/31/2013    Past Surgical History:  Procedure Laterality Date  . ABDOMINAL HYSTERECTOMY    . JOINT REPLACEMENT     right knee 2006  . rotater cuff     right, 2005    Social History   Socioeconomic History  . Marital status: Widowed    Spouse name: Not on file  . Number of children: 5  . Years of education: 33  . Highest education level: Not on file  Occupational History  . Occupation: Retired Optometrist  Tobacco Use  . Smoking status: Never Smoker  . Smokeless tobacco: Never Used  Substance and Sexual Activity  . Alcohol use: No    Alcohol/week: 0.0 standard drinks  . Drug use: No  . Sexual activity: Not on file  Other Topics Concern  . Not on file  Social History Narrative  . Not on file   Social Determinants of Health   Financial Resource Strain:   . Difficulty of Paying Living Expenses: Not on file  Food Insecurity:   . Worried About Charity fundraiser in the Last Year: Not on file  . Ran Out of Food in the Last Year: Not on file  Transportation Needs:   . Lack of Transportation (Medical): Not on file  . Lack of Transportation (Non-Medical): Not on file  Physical Activity:   . Days of Exercise per Week: Not on file  . Minutes of Exercise per Session: Not on file  Stress:   . Feeling of Stress : Not on file  Social Connections:   . Frequency of Communication with Friends and Family: Not on file  . Frequency of Social  Gatherings with Friends and Family: Not on file  . Attends Religious Services: Not on file  . Active Member of Clubs or Organizations: Not on file  . Attends Archivist Meetings: Not on file  . Marital Status: Not on file  Intimate Partner Violence:   . Fear of Current or Ex-Partner: Not on file  . Emotionally Abused: Not on file  . Physically Abused: Not on file  . Sexually Abused: Not on file    Family History  Problem Relation Age of Onset  . Heart disease Father   .  Arthritis Other   . Heart disease Other   . Hypertension Other   . Diabetes Other   . Alcohol abuse Other   . Arthritis Other   . Cancer Other        Lung Cancer  . Heart disease Other   . Hypertension Other   . Sudden death Other   . Kidney disease Other   . Mental illness Other   . Diabetes Other   . Colon cancer Neg Hx     ROS: no fevers or chills, productive cough, hemoptysis, dysphasia, odynophagia, melena, hematochezia, dysuria, hematuria, rash, seizure activity, orthopnea, PND, pedal edema, claudication. Remaining systems are negative.  Physical Exam: Well-developed well-nourished in no acute distress.  Skin is warm and dry.  HEENT is normal.  Neck is supple.  Chest is clear to auscultation with normal expansion.  Cardiovascular exam is regular rate and rhythm.  Abdominal exam nontender or distended. No masses palpated. Extremities show no edema. neuro grossly intact  ECG- personally reviewed  A/P  1 hypertrophic cardiomyopathy-we will plan to repeat echocardiogram.  As outlined previously there is no family history of sudden death and no history of syncope.  Continue metoprolol.  We discussed having her family member screened.  2 chronic diastolic congestive heart failure-she is euvolemic.  Continue present dose of diuretic.  3 hypertension-patient's blood pressure is controlled.  Continue present medications and follow.  Kirk Ruths, MD

## 2019-07-01 ENCOUNTER — Ambulatory Visit: Payer: Medicare Other | Admitting: Cardiology

## 2019-07-02 ENCOUNTER — Ambulatory Visit: Payer: Medicare Other | Admitting: Cardiology

## 2019-07-04 ENCOUNTER — Ambulatory Visit: Payer: Medicare Other | Admitting: Cardiology

## 2019-07-11 ENCOUNTER — Other Ambulatory Visit: Payer: Self-pay

## 2019-07-11 ENCOUNTER — Other Ambulatory Visit: Payer: Self-pay | Admitting: Internal Medicine

## 2019-07-11 MED ORDER — GLIPIZIDE ER 2.5 MG PO TB24: 2.5000 mg | ORAL_TABLET | Freq: Every day | ORAL | 3 refills | Status: DC

## 2019-07-14 ENCOUNTER — Ambulatory Visit: Payer: Medicare Other

## 2019-07-17 ENCOUNTER — Ambulatory Visit: Payer: Medicare Other | Attending: Internal Medicine

## 2019-07-17 DIAGNOSIS — R6889 Other general symptoms and signs: Secondary | ICD-10-CM | POA: Diagnosis not present

## 2019-07-17 DIAGNOSIS — Z23 Encounter for immunization: Secondary | ICD-10-CM

## 2019-07-17 NOTE — Progress Notes (Signed)
   Covid-19 Vaccination Clinic  Name:  Alyssa Ingram    MRN: [id number] DOB: 1945/06/30  07/17/2019  [name] was observed post Covid-19 immunization for 30 minutes based on pre-vaccination screening without incident. She was provided with Vaccine Information Sheet and instruction to access the V-Safe system.   [name] was instructed to call 911 with any severe reactions post vaccine: Marland Kitchen Difficulty breathing  . Swelling of face and throat  . A fast heartbeat  . A bad rash all over body  . Dizziness and weakness   Immunizations Administered    Name Date Dose VIS Date Route   Pfizer COVID-19 Vaccine 07/17/2019 10:39 AM 0.3 mL 04/25/2019 Intramuscular   Manufacturer: [place]   Lot: [id number]   [place]: [id number]

## 2019-07-22 ENCOUNTER — Ambulatory Visit: Payer: Medicare Other | Admitting: Podiatry

## 2019-07-23 ENCOUNTER — Telehealth: Payer: Self-pay

## 2019-07-23 NOTE — Telephone Encounter (Signed)
Pt calling to see when her appointment was schedule

## 2019-07-29 ENCOUNTER — Ambulatory Visit: Payer: Medicare Other

## 2019-07-30 ENCOUNTER — Encounter: Payer: Self-pay | Admitting: Internal Medicine

## 2019-08-11 ENCOUNTER — Other Ambulatory Visit: Payer: Self-pay | Admitting: Internal Medicine

## 2019-08-12 ENCOUNTER — Ambulatory Visit: Payer: Medicare Other | Attending: Internal Medicine

## 2019-08-12 DIAGNOSIS — R6889 Other general symptoms and signs: Secondary | ICD-10-CM | POA: Diagnosis not present

## 2019-08-12 DIAGNOSIS — Z23 Encounter for immunization: Secondary | ICD-10-CM

## 2019-08-12 NOTE — Progress Notes (Signed)
   Covid-19 Vaccination Clinic  Name:  Alyssa Ingram    MRN: [id number] DOB: 1946-01-09  08/12/2019  [name] was observed post Covid-19 immunization for 15 minutes without incident. She was provided with Vaccine Information Sheet and instruction to access the V-Safe system.   [name] was instructed to call 911 with any severe reactions post vaccine: Marland Kitchen Difficulty breathing  . Swelling of face and throat  . A fast heartbeat  . A bad rash all over body  . Dizziness and weakness   Immunizations Administered    Name Date Dose VIS Date Route   Pfizer COVID-19 Vaccine 08/12/2019  2:46 PM 0.3 mL 04/25/2019 Intramuscular   Manufacturer: [place]   Lot: [id number]   [place]: [id number]

## 2019-08-19 ENCOUNTER — Ambulatory Visit: Payer: Medicare Other | Admitting: Podiatry

## 2019-08-28 ENCOUNTER — Ambulatory Visit: Payer: Medicare Other | Admitting: Family Medicine

## 2019-08-28 DIAGNOSIS — Z0289 Encounter for other administrative examinations: Secondary | ICD-10-CM

## 2019-08-28 NOTE — Progress Notes (Deleted)
[place] Phone: [phone] Subjective:    I'm seeing this patient by the request  of:  [name], MD  CC:   [id number]   06/17/2019 Repeat injection given again today, tolerated procedure well, discussed icing regimen with home exercise, which activities to do which wants to avoid.  Patient will increase activity slowly over the course the next several weeks.  Follow-up again in 10 weeks.  Chronic problem with exacerbation social determinants including difficulty with transportation and multiple comorbidities  Repeat injection given again today.  Tolerated procedure well.  Discussed that we can repeat every 10 weeks if necessary.  Patient will continue to stay active otherwise.  Patient does walk with the aid of a walker.  Follow-up again in 10 weeks chronic problem with exacerbation patient has had comorbidities as well as social determinants including difficulty with transportation.  Update 08/28/2019 Alyssa Ingram is a 74 y.o. female coming in with complaint of left knee and left hip pain. Patient states   Onset-  Location Duration-  Character- Aggravating factors- Reliving factors-  Therapies tried-  Severity-     Past Medical History:  Diagnosis Date  . Abnormal MRI, spine 11/2011   ?discitis L5-S1, s/p CT bx 12/12/11  . Allergic rhinitis, cause unspecified 01/31/2013  . Arthritis   . Asthma   . Cervical cancer ([place])   . Chronic diastolic heart failure ([place])   . Diabetic retinopathy ([place])   . GERD (gastroesophageal reflux disease)   . H/O cardiovascular stress test    Nuclear study in 2011 normal perfusion  . HOCM (hypertrophic obstructive cardiomyopathy) ([place])    Echo 12/19/11: Severe LVH, EF 55-65%, dynamic obstruction, wall motion normal, grade 1 diastolic dysfunction, systolic anterior motion of the mitral valve with mild MS and mild MR, mild LAE, PASP 34  . Hyperlipidemia   .  Hypertension   . Kidney stones   . Obesity   . Type II or unspecified type diabetes mellitus without mention of complication, uncontrolled 01/31/2013   Past Surgical History:  Procedure Laterality Date  . ABDOMINAL HYSTERECTOMY    . JOINT REPLACEMENT     right knee 2006  . rotater cuff     right, 2005   Social History   Socioeconomic History  . Marital status: Widowed    Spouse name: Not on file  . Number of children: 5  . Years of education: 74  . Highest education level: Not on file  Occupational History  . Occupation: Retired Optometrist  Tobacco Use  . Smoking status: Never Smoker  . Smokeless tobacco: Never Used  Substance and Sexual Activity  . Alcohol use: No    Alcohol/week: 0.0 standard drinks  . Drug use: No  . Sexual activity: Not on file  Other Topics Concern  . Not on file  Social History Narrative  . Not on file   Social Determinants of Health   Financial Resource Strain:   . Difficulty of Paying Living Expenses:   Food Insecurity:   . Worried About Charity fundraiser in the Last Year:   . Arboriculturist in the Last Year:   Transportation Needs:   . Film/video editor (Medical):   Marland Kitchen Lack of Transportation (Non-Medical):   Physical Activity:   . Days of Exercise per Week:   . Minutes of Exercise per Session:   Stress:   . Feeling of Stress :   Social  Connections:   . Frequency of Communication with Friends and Family:   . Frequency of Social Gatherings with Friends and Family:   . Attends Religious Services:   . Active Member of Clubs or Organizations:   . Attends Archivist Meetings:   Marland Kitchen Marital Status:    Allergies  Allergen Reactions  . Sulfa Antibiotics Hives  . Metformin And Related   . Other Hives    Unknown drug  . Prednisone    Family History  Problem Relation Age of Onset  . Heart disease Father   . Arthritis Other   . Heart disease Other   . Hypertension Other   . Diabetes Other   . Alcohol abuse Other   .  Arthritis Other   . Cancer Other        Lung Cancer  . Heart disease Other   . Hypertension Other   . Sudden death Other   . Kidney disease Other   . Mental illness Other   . Diabetes Other   . Colon cancer Neg Hx     Current Outpatient Medications (Endocrine & Metabolic):  .  glipiZIDE (GLUCOTROL XL) 2.5 MG 24 hr tablet, Take 1 tablet (2.5 mg total) by mouth daily with breakfast.  Current Outpatient Medications (Cardiovascular):  .  atorvastatin (LIPITOR) 80 MG tablet, TAKE 1 TABLET BY MOUTH EVERY DAY .  diltiazem (CARDIZEM CD) 180 MG 24 hr capsule, TAKE 1 CAPSULE (180 MG TOTAL) BY MOUTH DAILY. .  furosemide (LASIX) 20 MG tablet, TAKE 3 TABLETS (60 MG TOTAL) BY MOUTH 2 TIMES DAILY. .  metoprolol tartrate (LOPRESSOR) 50 MG tablet, TAKE 1 TABLET BY MOUTH TWICE A DAY  Current Outpatient Medications (Respiratory):  .  albuterol (ACCUNEB) 0.63 MG/3ML nebulizer solution, INHALE 1 VIAL BY MOUTH VIA NEBULIZATION EVERY 6 HOURS AS NEEDED FOR WHEEZING. Marland Kitchen  albuterol (PROAIR HFA) 108 (90 Base) MCG/ACT inhaler, Inhale 2 puffs into the lungs 2 (two) times daily. .  cetirizine (ZYRTEC) 10 MG tablet, TAKE 1 TABLET (10 MG TOTAL) BY MOUTH DAILY as needed .  triamcinolone (NASACORT AQ) 55 MCG/ACT AERO nasal inhaler, Place 2 sprays into the nose daily. .  VENTOLIN HFA 108 (90 Base) MCG/ACT inhaler, TAKE 2 PUFFS BY MOUTH TWICE A DAY  Current Outpatient Medications (Analgesics):  .  allopurinol (ZYLOPRIM) 100 MG tablet, Take 1 tablet (100 mg total) by mouth daily. Marland Kitchen  aspirin 81 MG tablet, Take 162 mg by mouth daily.  .  meloxicam (MOBIC) 15 MG tablet, TAKE 1 TABLET (15 MG TOTAL) BY MOUTH DAILY AS NEEDED FOR PAIN. .  traMADol (ULTRAM) 50 MG tablet, Take 1 tablet (50 mg total) by mouth every 6 (six) hours as needed.   Current Outpatient Medications (Other):  .  ammonium lactate (AMLACTIN) 12 % cream, APPLY TOPICALLY AS NEEDED FOR DRY SKIN. Marland Kitchen  Blood Glucose Monitoring Suppl (ONETOUCH VERIO) w/Device KIT,  Use as directed once daily E11.9 .  Continuous Blood Gluc Receiver (FREESTYLE LIBRE READER) DEVI, Apply 1 Device topically 4 (four) times daily as needed. E11.9 .  Continuous Blood Gluc Sensor (FREESTYLE LIBRE 14 DAY SENSOR) MISC, Apply 1 Device topically every 14 (fourteen) days. E11.9 .  gabapentin (NEURONTIN) 300 MG capsule, TAKE 1 TO 2 CAPSULES BY MOUTH AT BEDTIME .  glucose blood test strip, Use as instructed once daily E11.9 .  lidocaine (LIDODERM) 5 %, Place 1 patch onto the skin daily. Remove & Discard patch within 12 hours or as directed by MD .  meclizine (ANTIVERT) 25 MG tablet, TAKE 1 TABLET BY MOUTH THREE TIMES A DAY .  NON FORMULARY, [place] [facility]  CREAMS- #11 (PERIPHERAL NEUROPATHY CREAM) .  ONETOUCH DELICA PLUS LANCETS MISC, Apply 1 Device topically daily. Use to check lood sugar once daily E11.9 .  pantoprazole (PROTONIX) 40 MG tablet, TAKE 1 TABLET BY MOUTH EVERY DAY .  tiZANidine (ZANAFLEX) 2 MG tablet, Take 1 tablet (2 mg total) by mouth every 6 (six) hours as needed for muscle spasms.   Reviewed prior external information including notes and imaging from  primary care provider As well as notes that were available from care everywhere and other healthcare systems.  Past medical history, social, surgical and family history all reviewed in electronic medical record.  No pertanent information unless stated regarding to the chief complaint.   Review of Systems:  No headache, visual changes, nausea, vomiting, diarrhea, constipation, dizziness, abdominal pain, skin rash, fevers, chills, night sweats, weight loss, swollen lymph nodes, body aches, joint swelling, chest pain, shortness of breath, mood changes. POSITIVE muscle aches  Objective  There were no vitals taken for this visit.   General: No apparent distress alert and oriented x3 mood and affect normal, dressed appropriately.  HEENT: Pupils equal, extraocular movements intact  Respiratory: Patient's speak in full  sentences and does not appear short of breath  Cardiovascular: No lower extremity edema, non tender, no erythema  Neuro: Cranial nerves II through XII are intact, neurovascularly intact in all extremities with 2+ DTRs and 2+ pulses.  Gait normal with good balance and coordination.  MSK:  Non tender with full range of motion and good stability and symmetric strength and tone of shoulders, elbows, wrist, hip, knee and ankles bilaterally.     Impression and Recommendations:     This case required medical decision making of moderate complexity. The above documentation has been reviewed and is accurate and complete Alyssa Pulley, DO       Note: This dictation was prepared with Dragon dictation along with smaller phrase technology. Any transcriptional errors that result from this process are unintentional.

## 2019-09-04 ENCOUNTER — Ambulatory Visit (INDEPENDENT_AMBULATORY_CARE_PROVIDER_SITE_OTHER): Payer: Medicare Other | Admitting: Family Medicine

## 2019-09-04 ENCOUNTER — Other Ambulatory Visit: Payer: Self-pay

## 2019-09-04 VITALS — BP 124/68 | Ht 61.0 in | Wt 226.0 lb

## 2019-09-04 DIAGNOSIS — G8929 Other chronic pain: Secondary | ICD-10-CM | POA: Diagnosis not present

## 2019-09-04 DIAGNOSIS — M25511 Pain in right shoulder: Secondary | ICD-10-CM

## 2019-09-04 DIAGNOSIS — M1712 Unilateral primary osteoarthritis, left knee: Secondary | ICD-10-CM

## 2019-09-04 NOTE — Progress Notes (Signed)
Subjective:     Alyssa Ingram [name] [name]: [phone] Subjective:    I'm seeing this patient by the request  of:  [name], MD This visit occurred during the SARS-CoV-2 public health emergency.  Safety protocols were in place, including screening questions prior to the visit, additional usage of staff PPE, and extensive cleaning of exam room while observing appropriate contact time as indicated for disinfecting solutions.   CC: Left knee pain, right shoulder pain  [id number]   06/17/2019 Repeat injection given again today, tolerated procedure well, discussed icing regimen with home exercise, which activities to do which wants to avoid.  Patient will increase activity slowly over the course the next several weeks.  Follow-up again in 10 weeks.  Chronic problem with exacerbation social determinants including difficulty with transportation and multiple comorbidities  Repeat injection given again today.  Tolerated procedure well.  Discussed that we can repeat every 10 weeks if necessary.  Patient will continue to stay active otherwise.  Patient does walk with the aid of a walker.  Follow-up again in 10 weeks chronic problem with exacerbation patient has had comorbidities as well as social determinants including difficulty with transportation.   Update 09/04/2019 Alyssa Ingram is a 74 y.o. female coming in with complaint of left knee and left hip pain. Patient states that the injections did not help as much as they usually would. Was having some sharp knee pain in anterior aspect of knee. Left hip pain is less but is coming back. 1 Patient is having difficulty with ambulation secondary to the knee and affecting daily activities.  Patient is accompanied with son who states that having more difficulty but it seems to always respond well to the injections.  Right shoulder also hurting her.  Waking her up at night.    Past  Medical History:  Diagnosis Date  . Abnormal MRI, spine 11/2011   ?discitis L5-S1, s/p CT bx 12/12/11  . Allergic rhinitis, cause unspecified 01/31/2013  . Arthritis   . Asthma   . Cervical cancer ([place])   . Chronic diastolic heart failure ([place])   . Diabetic retinopathy ([place])   . GERD (gastroesophageal reflux disease)   . H/O cardiovascular stress test    Nuclear study in 2011 normal perfusion  . HOCM (hypertrophic obstructive cardiomyopathy) ([place])    Echo 12/19/11: Severe LVH, EF 55-65%, dynamic obstruction, wall motion normal, grade 1 diastolic dysfunction, systolic anterior motion of the mitral valve with mild MS and mild MR, mild LAE, PASP 34  . Hyperlipidemia   . Hypertension   . Kidney stones   . Obesity   . Type II or unspecified type diabetes mellitus without mention of complication, uncontrolled 01/31/2013   Past Surgical History:  Procedure Laterality Date  . ABDOMINAL HYSTERECTOMY    . JOINT REPLACEMENT     right knee 2006  . rotater cuff     right, 2005   Social History   Socioeconomic History  . Marital status: Widowed    Spouse name: Not on file  . Number of children: 5  . Years of education: 82  . Highest education level: Not on file  Occupational History  . Occupation: Retired Optometrist  Tobacco Use  . Smoking status: Never Smoker  . Smokeless tobacco: Never Used  Substance and Sexual Activity  . Alcohol use: No    Alcohol/week: 0.0 standard drinks  . Drug use: No  . Sexual activity: Not  on file  Other Topics Concern  . Not on file  Social History Narrative  . Not on file   Social Determinants of Health   Financial Resource Strain:   . Difficulty of Paying Living Expenses:   Food Insecurity:   . Worried About Charity fundraiser in the Last Year:   . Arboriculturist in the Last Year:   Transportation Needs:   . Film/video editor (Medical):   Marland Kitchen Lack of Transportation (Non-Medical):   Physical Activity:   . Days of Exercise per Week:   .  Minutes of Exercise per Session:   Stress:   . Feeling of Stress :   Social Connections:   . Frequency of Communication with Friends and Family:   . Frequency of Social Gatherings with Friends and Family:   . Attends Religious Services:   . Active Member of Clubs or Organizations:   . Attends Archivist Meetings:   Marland Kitchen Marital Status:    Allergies  Allergen Reactions  . Sulfa Antibiotics Hives  . Metformin And Related   . Other Hives    Unknown drug  . Prednisone    Family History  Problem Relation Age of Onset  . Heart disease Father   . Arthritis Other   . Heart disease Other   . Hypertension Other   . Diabetes Other   . Alcohol abuse Other   . Arthritis Other   . Cancer Other        Lung Cancer  . Heart disease Other   . Hypertension Other   . Sudden death Other   . Kidney disease Other   . Mental illness Other   . Diabetes Other   . Colon cancer Neg Hx     Current Outpatient Medications (Endocrine & Metabolic):  .  glipiZIDE (GLUCOTROL XL) 2.5 MG 24 hr tablet, Take 1 tablet (2.5 mg total) by mouth daily with breakfast.  Current Outpatient Medications (Cardiovascular):  .  atorvastatin (LIPITOR) 80 MG tablet, TAKE 1 TABLET BY MOUTH EVERY DAY .  diltiazem (CARDIZEM CD) 180 MG 24 hr capsule, TAKE 1 CAPSULE (180 MG TOTAL) BY MOUTH DAILY. .  furosemide (LASIX) 20 MG tablet, TAKE 3 TABLETS (60 MG TOTAL) BY MOUTH 2 TIMES DAILY. .  metoprolol tartrate (LOPRESSOR) 50 MG tablet, TAKE 1 TABLET BY MOUTH TWICE A DAY  Current Outpatient Medications (Respiratory):  .  albuterol (ACCUNEB) 0.63 MG/3ML nebulizer solution, INHALE 1 VIAL BY MOUTH VIA NEBULIZATION EVERY 6 HOURS AS NEEDED FOR WHEEZING. Marland Kitchen  albuterol (PROAIR HFA) 108 (90 Base) MCG/ACT inhaler, Inhale 2 puffs into the lungs 2 (two) times daily. .  cetirizine (ZYRTEC) 10 MG tablet, TAKE 1 TABLET (10 MG TOTAL) BY MOUTH DAILY as needed .  triamcinolone (NASACORT AQ) 55 MCG/ACT AERO nasal inhaler, Place 2 sprays  into the nose daily. .  VENTOLIN HFA 108 (90 Base) MCG/ACT inhaler, TAKE 2 PUFFS BY MOUTH TWICE A DAY  Current Outpatient Medications (Analgesics):  .  allopurinol (ZYLOPRIM) 100 MG tablet, Take 1 tablet (100 mg total) by mouth daily. Marland Kitchen  aspirin 81 MG tablet, Take 162 mg by mouth daily.  .  meloxicam (MOBIC) 15 MG tablet, TAKE 1 TABLET (15 MG TOTAL) BY MOUTH DAILY AS NEEDED FOR PAIN. .  traMADol (ULTRAM) 50 MG tablet, Take 1 tablet (50 mg total) by mouth every 6 (six) hours as needed.   Current Outpatient Medications (Other):  .  ammonium lactate (AMLACTIN) 12 % cream, APPLY TOPICALLY  AS NEEDED FOR DRY SKIN. Marland Kitchen  Blood Glucose Monitoring Suppl (ONETOUCH VERIO) w/Device KIT, Use as directed once daily E11.9 .  Continuous Blood Gluc Receiver (FREESTYLE LIBRE READER) DEVI, Apply 1 Device topically 4 (four) times daily as needed. E11.9 .  Continuous Blood Gluc Sensor (FREESTYLE LIBRE 14 DAY SENSOR) MISC, Apply 1 Device topically every 14 (fourteen) days. E11.9 .  gabapentin (NEURONTIN) 300 MG capsule, TAKE 1 TO 2 CAPSULES BY MOUTH AT BEDTIME .  glucose blood test strip, Use as instructed once daily E11.9 .  lidocaine (LIDODERM) 5 %, Place 1 patch onto the skin daily. Remove & Discard patch within 12 hours or as directed by MD .  meclizine (ANTIVERT) 25 MG tablet, TAKE 1 TABLET BY MOUTH THREE TIMES A DAY .  NON FORMULARY, [facility]  CREAMS- #11 (PERIPHERAL NEUROPATHY CREAM) .  ONETOUCH DELICA PLUS LANCETS MISC, Apply 1 Device topically daily. Use to check lood sugar once daily E11.9 .  pantoprazole (PROTONIX) 40 MG tablet, TAKE 1 TABLET BY MOUTH EVERY DAY .  tiZANidine (ZANAFLEX) 2 MG tablet, Take 1 tablet (2 mg total) by mouth every 6 (six) hours as needed for muscle spasms.   Reviewed prior external information including notes and imaging from  primary care provider As well as notes that were available from care everywhere and other healthcare systems.  Past medical history, social,  surgical and family history all reviewed in electronic medical record.  No pertanent information unless stated regarding to the chief complaint.   Review of Systems:  No headache, visual changes, nausea, vomiting, diarrhea, constipation, dizziness, abdominal pain, skin rash, fevers, chills, night sweats, weight loss, swollen lymph nodes, body aches, joint swelling, chest pain, shortness of breath, mood changes. POSITIVE muscle aches  Objective  Blood pressure 124/68, height '5\' 1"'  (1.549 m), weight 226 lb (102.5 kg).   General: No apparent distress alert and oriented x3 mood and affect normal, dressed appropriately.  HEENT: Pupils equal, extraocular movements intact  Respiratory: Patient's speak in full sentences and does not appear short of breath  Bilateral lower extremities have severe 3+ pitting edema with hemosiderin deposits. Knee: left  valgus deformity noted. Abnormal thigh to calf ratio.  Tender to palpation over medial and PF joint line.  ROM limited in all range of motion  instability with valgus force.  painful patellar compression. Patellar glide with moderate crepitus. Patellar and quadriceps tendons unremarkable.  Right shoulder has atrophy noted of the shoulder girdle.  Patient does have crepitus.  3-5 strength of the rotator cuff.  Neurovascular intact distally.  After informed written and verbal consent, patient was seated on exam table. Right shoulder was prepped with alcohol swab and utilizing posterior approach, patient's right glenohumeral space was injected with 4:1  marcaine 0.5%: Kenalog 26m/dL. Patient tolerated the procedure well without immediate complications.  After informed written and verbal consent, patient was seated on exam table. Left knee was prepped with alcohol swab and utilizing anterolateral approach, patient's left knee space was injected with 4:1  marcaine 0.5%: Kenalog 473mdL. Patient tolerated the procedure well without immediate  complications.      Impression and Recommendations:     This case required medical decision making of moderate complexity. The above documentation has been reviewed and is accurate and complete ZaLyndal PulleyDO

## 2019-09-04 NOTE — Patient Instructions (Addendum)
Injected shoulder and knee Xray today See me again in 8 weeks

## 2019-09-05 ENCOUNTER — Other Ambulatory Visit: Payer: Self-pay

## 2019-09-05 ENCOUNTER — Encounter: Payer: Self-pay | Admitting: Family Medicine

## 2019-09-05 DIAGNOSIS — K219 Gastro-esophageal reflux disease without esophagitis: Secondary | ICD-10-CM

## 2019-09-05 MED ORDER — PANTOPRAZOLE SODIUM 40 MG PO TBEC: 40.0000 mg | DELAYED_RELEASE_TABLET | Freq: Every day | ORAL | 3 refills | Status: DC

## 2019-09-05 NOTE — Assessment & Plan Note (Signed)
Chronic problem with worsening symptoms.  Secondary to social determinants of health including patient being nearly immobile secondary to comorbidities this is the safest treatment option in this individual.  Test to avoid anti-inflammatories significantly secondary to GI.  Could be a candidate for viscosupplementation but wants to continue with this treatment option.  Follow-up again in 8 to 10 weeks

## 2019-09-05 NOTE — Assessment & Plan Note (Signed)
Rotator cuff arthropathy.  We will repeat injection today.  Chronic problem with exacerbation.  Topical anti-inflammatories sample given today.  Discussed icing regimen, follow-up again in  8 weeks

## 2019-09-09 NOTE — Progress Notes (Deleted)
HPI: FU HCM. Last seen 09/14/16. Patient previously lived in [place]. Records from there include: Nuclear study in November of 2011 showed an ejection fraction of 62% and normal perfusion. Exercise treadmill February 2014 showed poor exercise capacity and study was technically difficult. However no hypotension noted. Carotid Dopplers January 2014 showed no significant carotid disease. 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 Echocardiogram May 2018 showed normal LV systolic function, severe asymmetric basal septal hypertrophy, mild LVOT gradient (17 mmHg peak), grade 1 diastolic dysfunction, mitral annular calcification with mild mitral stenosis (mean gradient 4 mmHg), mild mitral regurgitation; moderate to severe LAE.  Since last seen,   Current Outpatient Medications  Medication Sig Dispense Refill  . albuterol (ACCUNEB) 0.63 MG/3ML nebulizer solution INHALE 1 VIAL BY MOUTH VIA NEBULIZATION EVERY 6 HOURS AS NEEDED FOR WHEEZING. 75 mL 12  . albuterol (PROAIR HFA) 108 (90 Base) MCG/ACT inhaler Inhale 2 puffs into the lungs 2 (two) times daily. 8.5 Inhaler 2  . allopurinol (ZYLOPRIM) 100 MG tablet Take 1 tablet (100 mg total) by mouth daily. 90 tablet 3  . ammonium lactate (AMLACTIN) 12 % cream APPLY TOPICALLY AS NEEDED FOR DRY SKIN. 385 g 0  . aspirin 81 MG tablet Take 162 mg by mouth daily.     Marland Kitchen atorvastatin (LIPITOR) 80 MG tablet TAKE 1 TABLET BY MOUTH EVERY DAY 90 tablet 2  . Blood Glucose Monitoring Suppl (ONETOUCH VERIO) w/Device KIT Use as directed once daily E11.9 1 kit 0  . cetirizine (ZYRTEC) 10 MG tablet TAKE 1 TABLET (10 MG TOTAL) BY MOUTH DAILY as needed 90 tablet 3  . Continuous Blood Gluc Receiver (FREESTYLE LIBRE READER) DEVI Apply 1 Device topically 4 (four) times daily as needed. E11.9 1 Device 0  . Continuous Blood Gluc Sensor (FREESTYLE LIBRE 14 DAY SENSOR) MISC Apply 1 Device topically every 14 (fourteen) days. E11.9 6 each 3  . diltiazem (CARDIZEM  CD) 180 MG 24 hr capsule TAKE 1 CAPSULE (180 MG TOTAL) BY MOUTH DAILY. 90 capsule 1  . furosemide (LASIX) 20 MG tablet TAKE 3 TABLETS (60 MG TOTAL) BY MOUTH 2 TIMES DAILY. 180 tablet 1  . gabapentin (NEURONTIN) 300 MG capsule TAKE 1 TO 2 CAPSULES BY MOUTH AT BEDTIME 180 capsule 1  . glipiZIDE (GLUCOTROL XL) 2.5 MG 24 hr tablet Take 1 tablet (2.5 mg total) by mouth daily with breakfast. 90 tablet 3  . glucose blood test strip Use as instructed once daily E11.9 100 each 12  . lidocaine (LIDODERM) 5 % Place 1 patch onto the skin daily. Remove & Discard patch within 12 hours or as directed by MD 40 patch 1  . meclizine (ANTIVERT) 25 MG tablet TAKE 1 TABLET BY MOUTH THREE TIMES A DAY 30 tablet 5  . meloxicam (MOBIC) 15 MG tablet TAKE 1 TABLET (15 MG TOTAL) BY MOUTH DAILY AS NEEDED FOR PAIN. 90 tablet 1  . metoprolol tartrate (LOPRESSOR) 50 MG tablet TAKE 1 TABLET BY MOUTH TWICE A DAY 180 tablet 3  . NON FORMULARY [place] [facility]  CREAMS- #11 (PERIPHERAL NEUROPATHY CREAM)    . ONETOUCH DELICA PLUS LANCETS MISC Apply 1 Device topically daily. Use to check lood sugar once daily E11.9 100 each 3  . pantoprazole (PROTONIX) 40 MG tablet Take 1 tablet (40 mg total) by mouth daily. 90 tablet 3  . tiZANidine (ZANAFLEX) 2 MG tablet Take 1 tablet (2 mg total) by mouth every 6 (six) hours as needed for muscle  spasms. 40 tablet 1  . traMADol (ULTRAM) 50 MG tablet Take 1 tablet (50 mg total) by mouth every 6 (six) hours as needed. 30 tablet 0  . triamcinolone (NASACORT AQ) 55 MCG/ACT AERO nasal inhaler Place 2 sprays into the nose daily. 3 Inhaler 3  . VENTOLIN HFA 108 (90 Base) MCG/ACT inhaler TAKE 2 PUFFS BY MOUTH TWICE A DAY 18 Inhaler 2   No current facility-administered medications for this visit.     Past Medical History:  Diagnosis Date  . Abnormal MRI, spine 11/2011   ?discitis L5-S1, s/p CT bx 12/12/11  . Allergic rhinitis, cause unspecified 01/31/2013  . Arthritis   . Asthma   . Cervical cancer  ([place])   . Chronic diastolic heart failure ([place])   . Diabetic retinopathy ([place])   . GERD (gastroesophageal reflux disease)   . H/O cardiovascular stress test    Nuclear study in 2011 normal perfusion  . HOCM (hypertrophic obstructive cardiomyopathy) ([place])    Echo 12/19/11: Severe LVH, EF 55-65%, dynamic obstruction, wall motion normal, grade 1 diastolic dysfunction, systolic anterior motion of the mitral valve with mild MS and mild MR, mild LAE, PASP 34  . Hyperlipidemia   . Hypertension   . Kidney stones   . Obesity   . Type II or unspecified type diabetes mellitus without mention of complication, uncontrolled 01/31/2013    Past Surgical History:  Procedure Laterality Date  . ABDOMINAL HYSTERECTOMY    . JOINT REPLACEMENT     right knee 2006  . rotater cuff     right, 2005    Social History   Socioeconomic History  . Marital status: Widowed    Spouse name: Not on file  . Number of children: 5  . Years of education: 98  . Highest education level: Not on file  Occupational History  . Occupation: Retired Optometrist  Tobacco Use  . Smoking status: Never Smoker  . Smokeless tobacco: Never Used  Substance and Sexual Activity  . Alcohol use: No    Alcohol/week: 0.0 standard drinks  . Drug use: No  . Sexual activity: Not on file  Other Topics Concern  . Not on file  Social History Narrative  . Not on file   Social Determinants of Health   Financial Resource Strain:   . Difficulty of Paying Living Expenses:   Food Insecurity:   . Worried About Charity fundraiser in the Last Year:   . Arboriculturist in the Last Year:   Transportation Needs:   . Film/video editor (Medical):   Marland Kitchen Lack of Transportation (Non-Medical):   Physical Activity:   . Days of Exercise per Week:   . Minutes of Exercise per Session:   Stress:   . Feeling of Stress :   Social Connections:   . Frequency of Communication with Friends and Family:   . Frequency of Social Gatherings with Friends  and Family:   . Attends Religious Services:   . Active Member of Clubs or Organizations:   . Attends Archivist Meetings:   Marland Kitchen Marital Status:   Intimate Partner Violence:   . Fear of Current or Ex-Partner:   . Emotionally Abused:   Marland Kitchen Physically Abused:   . Sexually Abused:     Family History  Problem Relation Age of Onset  . Heart disease Father   . Arthritis Other   . Heart disease Other   . Hypertension Other   . Diabetes Other   .  Alcohol abuse Other   . Arthritis Other   . Cancer Other        Lung Cancer  . Heart disease Other   . Hypertension Other   . Sudden death Other   . Kidney disease Other   . Mental illness Other   . Diabetes Other   . Colon cancer Neg Hx     ROS: no fevers or chills, productive cough, hemoptysis, dysphasia, odynophagia, melena, hematochezia, dysuria, hematuria, rash, seizure activity, orthopnea, PND, pedal edema, claudication. Remaining systems are negative.  Physical Exam: Well-developed well-nourished in no acute distress.  Skin is warm and dry.  HEENT is normal.  Neck is supple.  Chest is clear to auscultation with normal expansion.  Cardiovascular exam is regular rate and rhythm.  Abdominal exam nontender or distended. No masses palpated. Extremities show no edema. neuro grossly intact  ECG- personally reviewed  A/P  1 hypertrophic obstructive cardiomyopathy-we will plan repeat echocardiogram.  Note I previously instructed her to have her children screened.  We will continue metoprolol at present dose.  No history of syncope and no family history of sudden death.  2 chronic diastolic congestive heart failure-patient appears to be euvolemic today.  Continue diuretic at present dose.  Note she does have chronic lymphedema.  3 hypertension-patient's blood pressure is controlled.  Continue present medications.  Kirk Ruths, MD

## 2019-09-11 ENCOUNTER — Ambulatory Visit: Payer: Medicare Other | Admitting: Cardiology

## 2019-09-24 ENCOUNTER — Other Ambulatory Visit: Payer: Self-pay | Admitting: Internal Medicine

## 2019-09-24 NOTE — Telephone Encounter (Signed)
Done erx 

## 2019-09-30 ENCOUNTER — Ambulatory Visit: Payer: Medicare Other

## 2019-10-02 ENCOUNTER — Ambulatory Visit: Payer: Medicare Other

## 2019-10-07 ENCOUNTER — Ambulatory Visit: Payer: Medicare Other

## 2019-10-07 ENCOUNTER — Ambulatory Visit
Admission: RE | Admit: 2019-10-07 | Discharge: 2019-10-07 | Disposition: A | Discharge status: Home / Self Care | Payer: Medicare Other | Source: Ambulatory Visit | Attending: Internal Medicine | Admitting: Internal Medicine

## 2019-10-07 ENCOUNTER — Other Ambulatory Visit: Payer: Self-pay

## 2019-10-07 DIAGNOSIS — Z1231 Encounter for screening mammogram for malignant neoplasm of breast: Secondary | ICD-10-CM

## 2019-10-13 ENCOUNTER — Other Ambulatory Visit: Payer: Self-pay | Admitting: Internal Medicine

## 2019-10-13 NOTE — Telephone Encounter (Signed)
Please refill as per office routine med refill policy (all routine meds refilled for 3 mo or monthly per pt preference up to one year from last visit, then month to month grace period for 3 mo, then further med refills will have to be denied)  

## 2019-10-27 ENCOUNTER — Other Ambulatory Visit: Payer: Self-pay | Admitting: Internal Medicine

## 2019-10-27 NOTE — Telephone Encounter (Signed)
Please refill as per office routine med refill policy (all routine meds refilled for 3 mo or monthly per pt preference up to one year from last visit, then month to month grace period for 3 mo, then further med refills will have to be denied)  

## 2019-11-04 ENCOUNTER — Encounter: Payer: Self-pay | Admitting: Family Medicine

## 2019-11-04 ENCOUNTER — Ambulatory Visit (INDEPENDENT_AMBULATORY_CARE_PROVIDER_SITE_OTHER): Payer: Medicare Other | Admitting: Family Medicine

## 2019-11-04 ENCOUNTER — Other Ambulatory Visit: Payer: Self-pay

## 2019-11-04 ENCOUNTER — Encounter: Payer: Self-pay | Admitting: Internal Medicine

## 2019-11-04 ENCOUNTER — Ambulatory Visit (INDEPENDENT_AMBULATORY_CARE_PROVIDER_SITE_OTHER): Payer: Medicare Other | Admitting: Internal Medicine

## 2019-11-04 VITALS — BP 102/80 | HR 50 | Temp 98.6°F | Ht 61.0 in

## 2019-11-04 DIAGNOSIS — J309 Allergic rhinitis, unspecified: Secondary | ICD-10-CM

## 2019-11-04 DIAGNOSIS — M25511 Pain in right shoulder: Secondary | ICD-10-CM

## 2019-11-04 DIAGNOSIS — M1712 Unilateral primary osteoarthritis, left knee: Secondary | ICD-10-CM | POA: Diagnosis not present

## 2019-11-04 DIAGNOSIS — G629 Polyneuropathy, unspecified: Secondary | ICD-10-CM

## 2019-11-04 DIAGNOSIS — E119 Type 2 diabetes mellitus without complications: Secondary | ICD-10-CM | POA: Diagnosis not present

## 2019-11-04 DIAGNOSIS — J4530 Mild persistent asthma, uncomplicated: Secondary | ICD-10-CM

## 2019-11-04 DIAGNOSIS — R070 Pain in throat: Secondary | ICD-10-CM

## 2019-11-04 DIAGNOSIS — G8929 Other chronic pain: Secondary | ICD-10-CM

## 2019-11-04 DIAGNOSIS — I10 Essential (primary) hypertension: Secondary | ICD-10-CM | POA: Diagnosis not present

## 2019-11-04 MED ORDER — FREESTYLE LIBRE READER DEVI: 1.0000 | Freq: Four times a day (QID) | 0 refills | Status: DC | PRN

## 2019-11-04 MED ORDER — DULOXETINE HCL 30 MG PO CPEP
30.0000 mg | ORAL_CAPSULE | Freq: Every day | ORAL | 3 refills | Status: DC
Start: 2019-11-04 — End: 2020-09-28

## 2019-11-04 MED ORDER — FREESTYLE LIBRE 14 DAY SENSOR MISC: 1.0000 | 3 refills | Status: DC

## 2019-11-04 NOTE — Assessment & Plan Note (Signed)
More likely a rotator cuff arthropathy.  Once again secondary to comorbidities not a surgical candidate.  No further imaging is necessary because it would not change medical management.  Discussed the potential for formal physical therapy and patient will consider this.  Following up with me again in 4 to 8 weeks for other ailments.

## 2019-11-04 NOTE — Assessment & Plan Note (Signed)
For restart allegra and nasacort asd

## 2019-11-04 NOTE — Assessment & Plan Note (Addendum)
Ok to restart the Human resources officer and [employer], and refer to ent if not improved in 1 wk  I spent 41 minutes in preparing to see the patient by review of recent labs, imaging and procedures, obtaining and reviewing separately obtained history, communicating with the patient and family or caregiver, ordering medications, tests or procedures, and documenting clinical information in the EHR including the differential Dx, treatment, and any further evaluation and other management of throat pain, allergies, dm, htn, asthma, neuropathy,

## 2019-11-04 NOTE — Assessment & Plan Note (Signed)
stable overall by history and exam, recent data reviewed with pt, and pt to continue medical treatment as before,  to f/u any worsening symptoms or concerns  

## 2019-11-04 NOTE — Progress Notes (Signed)
Subjective:    Patient ID: Alyssa Ingram, female    DOB: 11/26/1945, 74 y.o.   MRN: [id number]  HPI  Here to f/u; overall doing ok,  Pt denies chest pain, increasing sob or doe, wheezing, orthopnea, PND, increased LE swelling, palpitations, dizziness or syncope.  Pt denies new neurological symptoms such as new headache, or facial or extremity weakness or numbness.  Pt denies polydipsia, polyuria, or low sugar episode.  Pt states overall good compliance with meds, except has been taking the gabapentin bid instead of 2 qhs and has some mild daytime sedation, as she is trying to improve her neuritic pain to the legs that are now worsening during the day.  Also c/o 2 mo worsening throat discomfort but has not been taking her allergy meds.  Denies worsening depressive symptoms, suicidal ideation, or panic; has ongoing anxiety. Past Medical History:  Diagnosis Date  . Abnormal MRI, spine 11/2011   ?discitis L5-S1, s/p CT bx 12/12/11  . Allergic rhinitis, cause unspecified 01/31/2013  . Arthritis   . Asthma   . Cervical cancer ([place])   . Chronic diastolic heart failure ([place])   . Diabetic retinopathy ([place])   . GERD (gastroesophageal reflux disease)   . H/O cardiovascular stress test    Nuclear study in 2011 normal perfusion  . HOCM (hypertrophic obstructive cardiomyopathy) ([place])    Echo 12/19/11: Severe LVH, EF 55-65%, dynamic obstruction, wall motion normal, grade 1 diastolic dysfunction, systolic anterior motion of the mitral valve with mild MS and mild MR, mild LAE, PASP 34  . Hyperlipidemia   . Hypertension   . Kidney stones   . Obesity   . Type II or unspecified type diabetes mellitus without mention of complication, uncontrolled 01/31/2013   Past Surgical History:  Procedure Laterality Date  . ABDOMINAL HYSTERECTOMY    . JOINT REPLACEMENT     right knee 2006  . rotater cuff     right, 2005    reports that she has never smoked. She has never used smokeless tobacco. She reports that she  does not drink alcohol and does not use drugs. family history includes Alcohol abuse in an other family member; Arthritis in some other family members; Cancer in an other family member; Diabetes in some other family members; Heart disease in her father and other family members; Hypertension in some other family members; Kidney disease in an other family member; Mental illness in an other family member; Sudden death in an other family member. Allergies  Allergen Reactions  . Sulfa Antibiotics Hives  . Metformin And Related   . Other Hives    Unknown drug  . Prednisone    Current Outpatient Medications on File Prior to Visit  Medication Sig Dispense Refill  . albuterol (ACCUNEB) 0.63 MG/3ML nebulizer solution INHALE 1 VIAL BY MOUTH VIA NEBULIZATION EVERY 6 HOURS AS NEEDED FOR WHEEZING. 75 mL 12  . albuterol (PROAIR HFA) 108 (90 Base) MCG/ACT inhaler Inhale 2 puffs into the lungs 2 (two) times daily. 8.5 Inhaler 2  . allopurinol (ZYLOPRIM) 100 MG tablet Take 1 tablet (100 mg total) by mouth daily. 90 tablet 3  . ammonium lactate (AMLACTIN) 12 % cream APPLY TOPICALLY AS NEEDED FOR DRY SKIN. 385 g 0  . aspirin 81 MG tablet Take 162 mg by mouth daily.     Marland Kitchen atorvastatin (LIPITOR) 80 MG tablet TAKE 1 TABLET BY MOUTH EVERY DAY 90 tablet 2  . Blood Glucose Monitoring Suppl (ONETOUCH VERIO) w/Device KIT Use as  directed once daily E11.9 1 kit 0  . cetirizine (ZYRTEC) 10 MG tablet TAKE 1 TABLET (10 MG TOTAL) BY MOUTH DAILY as needed 90 tablet 3  . diltiazem (CARDIZEM CD) 180 MG 24 hr capsule TAKE 1 CAPSULE (180 MG TOTAL) BY MOUTH DAILY. 90 capsule 2  . furosemide (LASIX) 20 MG tablet TAKE 3 TABLETS (60 MG TOTAL) BY MOUTH 2 TIMES DAILY. 180 tablet 1  . gabapentin (NEURONTIN) 300 MG capsule TAKE 1 TO 2 CAPSULES BY MOUTH AT BEDTIME 180 capsule 1  . glipiZIDE (GLUCOTROL XL) 2.5 MG 24 hr tablet Take 1 tablet (2.5 mg total) by mouth daily with breakfast. 90 tablet 3  . glucose blood test strip Use as  instructed once daily E11.9 100 each 12  . lidocaine (LIDODERM) 5 % Place 1 patch onto the skin daily. Remove & Discard patch within 12 hours or as directed by MD 40 patch 1  . meclizine (ANTIVERT) 25 MG tablet TAKE 1 TABLET BY MOUTH THREE TIMES A DAY 30 tablet 5  . meloxicam (MOBIC) 15 MG tablet TAKE 1 TABLET (15 MG TOTAL) BY MOUTH DAILY AS NEEDED FOR PAIN. 90 tablet 1  . metoprolol tartrate (LOPRESSOR) 50 MG tablet TAKE 1 TABLET BY MOUTH TWICE A DAY 180 tablet 2  . NON FORMULARY [facility]  CREAMS- #11 (PERIPHERAL NEUROPATHY CREAM)    . ONETOUCH DELICA PLUS LANCETS MISC Apply 1 Device topically daily. Use to check lood sugar once daily E11.9 100 each 3  . pantoprazole (PROTONIX) 40 MG tablet Take 1 tablet (40 mg total) by mouth daily. 90 tablet 3  . tiZANidine (ZANAFLEX) 2 MG tablet Take 1 tablet (2 mg total) by mouth every 6 (six) hours as needed for muscle spasms. 40 tablet 1  . traMADol (ULTRAM) 50 MG tablet Take 1 tablet (50 mg total) by mouth every 6 (six) hours as needed. 30 tablet 0  . triamcinolone (NASACORT AQ) 55 MCG/ACT AERO nasal inhaler Place 2 sprays into the nose daily. 3 Inhaler 3  . VENTOLIN HFA 108 (90 Base) MCG/ACT inhaler TAKE 2 PUFFS BY MOUTH TWICE A DAY 18 Inhaler 2   No current facility-administered medications on file prior to visit.   Review of Systems All otherwise neg per pt     Objective:   Physical Exam BP 102/80 (BP Location: Left Arm, Patient Position: Sitting, Cuff Size: Large)   Pulse (!) 50   Temp 98.6 F (37 C) (Oral)   Ht '5\' 1"'  (1.549 m)   SpO2 90%   BMI 39.30 kg/m  VS noted,  Constitutional: Pt appears in NAD HENT: Head: NCAT.  Right Ear: External ear normal.  Left Ear: External ear normal.  Eyes: . Pupils are equal, round, and reactive to light. Conjunctivae and EOM are normal Nose: without d/c or deformity Neck: Neck supple. Gross normal ROM Cardiovascular: Normal rate and regular rhythm.   Pulmonary/Chest: Effort normal and  breath sounds without rales or wheezing.  Abd:  Soft, NT, ND, + BS, no organomegaly Neurological: Pt is alert. At baseline orientation, motor grossly intact Skin: Skin is warm. No rashes, other new lesions, no LE edema Psychiatric: Pt behavior is normal without agitation  All otherwise neg per pt Lab Results  Component Value Date   WBC 5.9 06/17/2019   HGB 11.8 (L) 06/17/2019   HCT 37.0 06/17/2019   PLT 248.0 06/17/2019   GLUCOSE 92 06/17/2019   CHOL 147 06/17/2019   TRIG 93.0 06/17/2019   HDL 50.70 06/17/2019  LDLCALC 78 06/17/2019   ALT 11 06/17/2019   AST 16 06/17/2019   NA 142 06/17/2019   K 4.3 06/17/2019   CL 104 06/17/2019   CREATININE 0.95 06/17/2019   BUN 21 06/17/2019   CO2 31 06/17/2019   TSH 0.39 06/17/2019   INR 1.03 12/12/2011   HGBA1C 6.8 (H) 06/17/2019   MICROALBUR 4.5 (H) 06/20/2017         Assessment & Plan:

## 2019-11-04 NOTE — Assessment & Plan Note (Signed)
Severe arthritic changes of the knee.  Injection given today.  An underlying potential gout is playing a role as well.  Patient does have chronic venous insufficiency and encourage patient to consider the potential for the compression sleeves.  Discussed the potential for viscosupplementation.  Due to other comorbidities patient is not candidate for any surgery.  Follow-up again in 4 to 8 weeks.

## 2019-11-04 NOTE — Progress Notes (Signed)
[facility] [address] [place] Phone: [phone] Subjective:   I Alyssa Ingram am serving as a Education administrator for Dr. Hulan Saas.  This visit occurred during the SARS-CoV-2 public health emergency.  Safety protocols were in place, including screening questions prior to the visit, additional usage of staff PPE, and extensive cleaning of exam room while observing appropriate contact time as indicated for disinfecting solutions.   I'm seeing this patient by the request  of:  [name], MD  CC: Knee pain, shoulder pain follow-up  [id number]   09/04/2019 Chronic problem with worsening symptoms.  Secondary to social determinants of health including patient being nearly immobile secondary to comorbidities this is the safest treatment option in this individual.  Test to avoid anti-inflammatories significantly secondary to GI.  Could be a candidate for viscosupplementation but wants to continue with this treatment option.  Follow-up again in 8 to 10 weeks  Rotator cuff arthropathy.  We will repeat injection today.  Chronic problem with exacerbation.  Topical anti-inflammatories sample given today.  Discussed icing regimen, follow-up again in  8 weeks  Update 11/04/2019 Alyssa Ingram is a 74 y.o. female coming in with complaint of left knee and right shoulder pain. Left knee is painful today. Shoulder and elbow pain today.  Patient states multiple other joints are hurting her as well today.  Known arthritic changes.  Multiple different comorbidities.  Having difficulty with ambulation at all at the moment.      Past Medical History:  Diagnosis Date  . Abnormal MRI, spine 11/2011   ?discitis L5-S1, s/p CT bx 12/12/11  . Allergic rhinitis, cause unspecified 01/31/2013  . Arthritis   . Asthma   . Cervical cancer ([place])   . Chronic diastolic heart failure ([place])   . Diabetic retinopathy ([place])   . GERD (gastroesophageal reflux disease)   . H/O  cardiovascular stress test    Nuclear study in 2011 normal perfusion  . HOCM (hypertrophic obstructive cardiomyopathy) ([place])    Echo 12/19/11: Severe LVH, EF 55-65%, dynamic obstruction, wall motion normal, grade 1 diastolic dysfunction, systolic anterior motion of the mitral valve with mild MS and mild MR, mild LAE, PASP 34  . Hyperlipidemia   . Hypertension   . Kidney stones   . Obesity   . Type II or unspecified type diabetes mellitus without mention of complication, uncontrolled 01/31/2013   Past Surgical History:  Procedure Laterality Date  . ABDOMINAL HYSTERECTOMY    . JOINT REPLACEMENT     right knee 2006  . rotater cuff     right, 2005   Social History   Socioeconomic History  . Marital status: Widowed    Spouse name: Not on file  . Number of children: 5  . Years of education: 66  . Highest education level: Not on file  Occupational History  . Occupation: Retired Optometrist  Tobacco Use  . Smoking status: Never Smoker  . Smokeless tobacco: Never Used  Substance and Sexual Activity  . Alcohol use: No    Alcohol/week: 0.0 standard drinks  . Drug use: No  . Sexual activity: Not on file  Other Topics Concern  . Not on file  Social History Narrative  . Not on file   Social Determinants of Health   Financial Resource Strain:   . Difficulty of Paying Living Expenses:   Food Insecurity:   . Worried About Charity fundraiser in the Last Year:   . Ran  Out of Food in the Last Year:   Transportation Needs:   . Lack of Transportation (Medical):   Marland Kitchen Lack of Transportation (Non-Medical):   Physical Activity:   . Days of Exercise per Week:   . Minutes of Exercise per Session:   Stress:   . Feeling of Stress :   Social Connections:   . Frequency of Communication with Friends and Family:   . Frequency of Social Gatherings with Friends and Family:   . Attends Religious Services:   . Active Member of Clubs or Organizations:   . Attends Archivist Meetings:   Marland Kitchen  Marital Status:    Allergies  Allergen Reactions  . Sulfa Antibiotics Hives  . Metformin And Related   . Other Hives    Unknown drug  . Prednisone    Family History  Problem Relation Age of Onset  . Heart disease Father   . Arthritis Other   . Heart disease Other   . Hypertension Other   . Diabetes Other   . Alcohol abuse Other   . Arthritis Other   . Cancer Other        Lung Cancer  . Heart disease Other   . Hypertension Other   . Sudden death Other   . Kidney disease Other   . Mental illness Other   . Diabetes Other   . Colon cancer Neg Hx     Current Outpatient Medications (Endocrine & Metabolic):  .  glipiZIDE (GLUCOTROL XL) 2.5 MG 24 hr tablet, Take 1 tablet (2.5 mg total) by mouth daily with breakfast.  Current Outpatient Medications (Cardiovascular):  .  atorvastatin (LIPITOR) 80 MG tablet, TAKE 1 TABLET BY MOUTH EVERY DAY .  diltiazem (CARDIZEM CD) 180 MG 24 hr capsule, TAKE 1 CAPSULE (180 MG TOTAL) BY MOUTH DAILY. .  furosemide (LASIX) 20 MG tablet, TAKE 3 TABLETS (60 MG TOTAL) BY MOUTH 2 TIMES DAILY. .  metoprolol tartrate (LOPRESSOR) 50 MG tablet, TAKE 1 TABLET BY MOUTH TWICE A DAY  Current Outpatient Medications (Respiratory):  .  albuterol (ACCUNEB) 0.63 MG/3ML nebulizer solution, INHALE 1 VIAL BY MOUTH VIA NEBULIZATION EVERY 6 HOURS AS NEEDED FOR WHEEZING. Marland Kitchen  albuterol (PROAIR HFA) 108 (90 Base) MCG/ACT inhaler, Inhale 2 puffs into the lungs 2 (two) times daily. .  cetirizine (ZYRTEC) 10 MG tablet, TAKE 1 TABLET (10 MG TOTAL) BY MOUTH DAILY as needed .  triamcinolone (NASACORT AQ) 55 MCG/ACT AERO nasal inhaler, Place 2 sprays into the nose daily. .  VENTOLIN HFA 108 (90 Base) MCG/ACT inhaler, TAKE 2 PUFFS BY MOUTH TWICE A DAY  Current Outpatient Medications (Analgesics):  .  allopurinol (ZYLOPRIM) 100 MG tablet, Take 1 tablet (100 mg total) by mouth daily. Marland Kitchen  aspirin 81 MG tablet, Take 162 mg by mouth daily.  .  meloxicam (MOBIC) 15 MG tablet, TAKE 1  TABLET (15 MG TOTAL) BY MOUTH DAILY AS NEEDED FOR PAIN. .  traMADol (ULTRAM) 50 MG tablet, Take 1 tablet (50 mg total) by mouth every 6 (six) hours as needed.   Current Outpatient Medications (Other):  .  ammonium lactate (AMLACTIN) 12 % cream, APPLY TOPICALLY AS NEEDED FOR DRY SKIN. Marland Kitchen  Blood Glucose Monitoring Suppl (ONETOUCH VERIO) w/Device KIT, Use as directed once daily E11.9 .  gabapentin (NEURONTIN) 300 MG capsule, TAKE 1 TO 2 CAPSULES BY MOUTH AT BEDTIME .  glucose blood test strip, Use as instructed once daily E11.9 .  lidocaine (LIDODERM) 5 %, Place 1 patch onto the  skin daily. Remove & Discard patch within 12 hours or as directed by MD .  meclizine (ANTIVERT) 25 MG tablet, TAKE 1 TABLET BY MOUTH THREE TIMES A DAY .  NON FORMULARY, [facility]  CREAMS- #11 (PERIPHERAL NEUROPATHY CREAM) .  ONETOUCH DELICA PLUS LANCETS MISC, Apply 1 Device topically daily. Use to check lood sugar once daily E11.9 .  pantoprazole (PROTONIX) 40 MG tablet, Take 1 tablet (40 mg total) by mouth daily. Marland Kitchen  tiZANidine (ZANAFLEX) 2 MG tablet, Take 1 tablet (2 mg total) by mouth every 6 (six) hours as needed for muscle spasms. .  Continuous Blood Gluc Receiver (FREESTYLE LIBRE READER) DEVI, Apply 1 Device topically 4 (four) times daily as needed. E11.9 .  Continuous Blood Gluc Sensor (FREESTYLE LIBRE 14 DAY SENSOR) MISC, Apply 1 Device topically every 14 (fourteen) days. E11.9 .  DULoxetine (CYMBALTA) 30 MG capsule, Take 1 capsule (30 mg total) by mouth daily.   Reviewed prior external information including notes and imaging from  primary care provider As well as notes that were available from care everywhere and other healthcare systems.  Past medical history, social, surgical and family history all reviewed in electronic medical record.  No pertanent information unless stated regarding to the chief complaint.   Review of Systems:  No headache, visual changes, nausea, vomiting, diarrhea,  constipation, dizziness, abdominal pain, skin rash, fevers, chills, night sweats, weight loss, swollen lymph nodes, body aches, joint swelling, chest pain, shortness of breath, mood changes. POSITIVE muscle aches  Objective  Blood pressure 100/80, pulse (!) 51, height _0  (1.549 m), weight 208 lb (94.3 kg), SpO2 92 %.   General: No apparent distress alert and oriented x3 mood and affect normal, dressed appropriately.  Patient has significant swelling of the lower extremities bilaterally with hemosiderin deposits noted.  Patient does walk with the aid of a rolling walker.  Patient's left knee exam shows significant arthritic changes with limited range of motion of only between 20 and 30 degrees of extension.  Diffuse tenderness noted.  Right knee does have a replacement Right shoulder exam shows the patient does have some mild crepitus noted.  3+ out of 5 strength of the rotator cuff noted.  Positive impingement noted.  After informed written and verbal consent, patient was seated on exam table. Right shoulder was prepped with alcohol swab and utilizing posterior approach, patient's right glenohumeral space was injected with 4:1  marcaine 0.5%: Kenalog 9m/dL. Patient tolerated the procedure well without immediate complications.  After informed written and verbal consent, patient was seated on exam table. Left knee was prepped with alcohol swab and utilizing anterolateral approach, patient's left knee space was injected with 4:1  marcaine 0.5%: Kenalog 491mdL. Patient tolerated the procedure well without immediate complications.    Impression and Recommendations:     The above documentation has been reviewed and is accurate and complete Alyssa PulleyDO       Note: This dictation was prepared with Dragon dictation along with smaller phrase technology. Any transcriptional errors that result from this process are unintentional.

## 2019-11-04 NOTE — Assessment & Plan Note (Signed)
[place] for gabapentin 300 - 600 qhs prn, but also add cymbalta 30 qd

## 2019-11-04 NOTE — Patient Instructions (Signed)
Please take all new medication as prescribed - the cymbalta 30 mg  Please continue all other medications as before, including the cetirizine and nasacirt  Please call in 1 wk for ENT referral if your throat is not improved  Your prescriptions for the [facility] were sent  Please have the pharmacy call with any other refills you may need.  Please continue your efforts at being more active, low cholesterol diet, and weight control.  You are otherwise up to date with prevention measures today.  Please keep your appointments with your specialists as you may have planned  You will be contacted regarding the referral for: Dr Gershon Crane for eyes

## 2019-11-06 ENCOUNTER — Other Ambulatory Visit: Payer: Self-pay | Admitting: Internal Medicine

## 2019-11-12 ENCOUNTER — Encounter: Payer: Self-pay | Admitting: Internal Medicine

## 2019-11-18 NOTE — Progress Notes (Deleted)
Referring-[name], MD Reason for referral-congestive heart failure  HPI: 74 year old female for evaluation of congestive heart failure [place] of Cathlean Cower, MD. patient seen previously but not since Sep 14, 2016. Patient previously lived in [place]. Records from there include: Nuclear study in November of 2011 showed an ejection fraction of 62% and normal perfusion. Exercise treadmill February 2014 showed poor exercise capacity and study was technically difficult. However no hypotension noted. Carotid Dopplers January 2014 showed no significant carotid disease. 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 Echo 2/17 vigorous LV function; severe asymmetric LVH, grade 1 DD; severe LAE; findings cw HOCM.  Echocardiogram here May 2018 showed severe asymmetric basal septal hypertrophy, vigorous LV function, mild left ventricular outflow tract gradient with peak of 17 mmHg.  Mild mitral stenosis with mean gradient 4 mmHg, mild mitral regurgitation, moderate to severe left atrial enlargement.  Current Outpatient Medications  Medication Sig Dispense Refill  . albuterol (ACCUNEB) 0.63 MG/3ML nebulizer solution INHALE 1 VIAL BY MOUTH VIA NEBULIZATION EVERY 6 HOURS AS NEEDED FOR WHEEZING. 75 mL 12  . albuterol (PROAIR HFA) 108 (90 Base) MCG/ACT inhaler Inhale 2 puffs into the lungs 2 (two) times daily. 8.5 Inhaler 2  . allopurinol (ZYLOPRIM) 100 MG tablet Take 1 tablet (100 mg total) by mouth daily. 90 tablet 3  . ammonium lactate (AMLACTIN) 12 % cream APPLY TOPICALLY AS NEEDED FOR DRY SKIN. 385 g 0  . aspirin 81 MG tablet Take 162 mg by mouth daily.     Marland Kitchen atorvastatin (LIPITOR) 80 MG tablet TAKE 1 TABLET BY MOUTH EVERY DAY 90 tablet 2  . Blood Glucose Monitoring Suppl (ONETOUCH VERIO) w/Device KIT Use as directed once daily E11.9 1 kit 0  . cetirizine (ZYRTEC) 10 MG tablet TAKE 1 TABLET BY MOUTH EVERY DAY AS NEEDED 90 tablet 3  . Continuous Blood Gluc Receiver (FREESTYLE LIBRE READER)  DEVI Apply 1 Device topically 4 (four) times daily as needed. E11.9 1 each 0  . Continuous Blood Gluc Sensor (FREESTYLE LIBRE 14 DAY SENSOR) MISC Apply 1 Device topically every 14 (fourteen) days. E11.9 6 each 3  . diltiazem (CARDIZEM CD) 180 MG 24 hr capsule TAKE 1 CAPSULE (180 MG TOTAL) BY MOUTH DAILY. 90 capsule 2  . DULoxetine (CYMBALTA) 30 MG capsule Take 1 capsule (30 mg total) by mouth daily. 90 capsule 3  . furosemide (LASIX) 20 MG tablet TAKE 3 TABLETS (60 MG TOTAL) BY MOUTH 2 TIMES DAILY. 180 tablet 1  . gabapentin (NEURONTIN) 300 MG capsule TAKE 1 TO 2 CAPSULES BY MOUTH AT BEDTIME 180 capsule 1  . glipiZIDE (GLUCOTROL XL) 2.5 MG 24 hr tablet Take 1 tablet (2.5 mg total) by mouth daily with breakfast. 90 tablet 3  . glucose blood test strip Use as instructed once daily E11.9 100 each 12  . lidocaine (LIDODERM) 5 % Place 1 patch onto the skin daily. Remove & Discard patch within 12 hours or as directed by MD 40 patch 1  . meclizine (ANTIVERT) 25 MG tablet TAKE 1 TABLET BY MOUTH THREE TIMES A DAY 30 tablet 5  . meloxicam (MOBIC) 15 MG tablet TAKE 1 TABLET (15 MG TOTAL) BY MOUTH DAILY AS NEEDED FOR PAIN. 90 tablet 1  . metoprolol tartrate (LOPRESSOR) 50 MG tablet TAKE 1 TABLET BY MOUTH TWICE A DAY 180 tablet 2  . NON FORMULARY  APOTHECARY  CREAMS- #11 (PERIPHERAL NEUROPATHY CREAM)    . ONETOUCH DELICA PLUS LANCETS MISC Apply 1 Device topically  daily. Use to check lood sugar once daily E11.9 100 each 3  . pantoprazole (PROTONIX) 40 MG tablet Take 1 tablet (40 mg total) by mouth daily. 90 tablet 3  . tiZANidine (ZANAFLEX) 2 MG tablet Take 1 tablet (2 mg total) by mouth every 6 (six) hours as needed for muscle spasms. 40 tablet 1  . traMADol (ULTRAM) 50 MG tablet Take 1 tablet (50 mg total) by mouth every 6 (six) hours as needed. 30 tablet 0  . triamcinolone (NASACORT AQ) 55 MCG/ACT AERO nasal inhaler Place 2 sprays into the nose daily. 3 Inhaler 3  . VENTOLIN HFA 108 (90 Base)  MCG/ACT inhaler TAKE 2 PUFFS BY MOUTH TWICE A DAY 18 Inhaler 2   No current facility-administered medications for this visit.    Allergies  Allergen Reactions  . Sulfa Antibiotics Hives  . Metformin And Related   . Other Hives    Unknown drug  . Prednisone     Past Medical History:  Diagnosis Date  . Abnormal MRI, spine 11/2011   ?discitis L5-S1, s/p CT bx 12/12/11  . Allergic rhinitis, cause unspecified 01/31/2013  . Arthritis   . Asthma   . Cervical cancer ([place])   . Chronic diastolic heart failure ([place])   . Diabetic retinopathy ([place])   . GERD (gastroesophageal reflux disease)   . H/O cardiovascular stress test    Nuclear study in 2011 normal perfusion  . HOCM (hypertrophic obstructive cardiomyopathy) ([place])    Echo 12/19/11: Severe LVH, EF 55-65%, dynamic obstruction, wall motion normal, grade 1 diastolic dysfunction, systolic anterior motion of the mitral valve with mild MS and mild MR, mild LAE, PASP 34  . Hyperlipidemia   . Hypertension   . Kidney stones   . Obesity   . Type II or unspecified type diabetes mellitus without mention of complication, uncontrolled 01/31/2013    Past Surgical History:  Procedure Laterality Date  . ABDOMINAL HYSTERECTOMY    . JOINT REPLACEMENT     right knee 2006  . rotater cuff     right, 2005    Social History   Socioeconomic History  . Marital status: Widowed    Spouse name: Not on file  . Number of children: 5  . Years of education: 55  . Highest education level: Not on file  Occupational History  . Occupation: Retired Optometrist  Tobacco Use  . Smoking status: Never Smoker  . Smokeless tobacco: Never Used  Substance and Sexual Activity  . Alcohol use: No    Alcohol/week: 0.0 standard drinks  . Drug use: No  . Sexual activity: Not on file  Other Topics Concern  . Not on file  Social History Narrative  . Not on file   Social Determinants of Health   Financial Resource Strain:   . Difficulty of Paying Living Expenses:    Food Insecurity:   . Worried About Charity fundraiser in the Last Year:   . Arboriculturist in the Last Year:   Transportation Needs:   . Film/video editor (Medical):   Marland Kitchen Lack of Transportation (Non-Medical):   Physical Activity:   . Days of Exercise per Week:   . Minutes of Exercise per Session:   Stress:   . Feeling of Stress :   Social Connections:   . Frequency of Communication with Friends and Family:   . Frequency of Social Gatherings with Friends and Family:   . Attends Religious Services:   . Active Member of Clubs  or Organizations:   . Attends Archivist Meetings:   Marland Kitchen Marital Status:   Intimate Partner Violence:   . Fear of Current or Ex-Partner:   . Emotionally Abused:   Marland Kitchen Physically Abused:   . Sexually Abused:     Family History  Problem Relation Age of Onset  . Heart disease Father   . Arthritis Other   . Heart disease Other   . Hypertension Other   . Diabetes Other   . Alcohol abuse Other   . Arthritis Other   . Cancer Other        Lung Cancer  . Heart disease Other   . Hypertension Other   . Sudden death Other   . Kidney disease Other   . Mental illness Other   . Diabetes Other   . Colon cancer Neg Hx     ROS: no fevers or chills, productive cough, hemoptysis, dysphasia, odynophagia, melena, hematochezia, dysuria, hematuria, rash, seizure activity, orthopnea, PND, pedal edema, claudication. Remaining systems are negative.  Physical Exam:   There were no vitals taken for this visit.  General:  Well developed/well nourished in NAD Skin warm/dry Patient not depressed No peripheral clubbing Back-normal HEENT-normal/normal eyelids Neck supple/normal carotid upstroke bilaterally; no bruits; no JVD; no thyromegaly chest - CTA/ normal expansion CV - RRR/normal S1 and S2; no murmurs, rubs or gallops;  PMI nondisplaced Abdomen -NT/ND, no HSM, no mass, + bowel sounds, no bruit 2+ femoral pulses, no bruits Ext-no edema, chords, 2+  DP Neuro-grossly nonfocal  ECG - personally reviewed  A/P  1 chronic diastolic congestive heart failure-patient is euvolemic today on examination.  We will continue present dose of diuretic.  2 hypertrophic obstructive cardiomyopathy-we will repeat echocardiogram.  Note she does not have a family history of sudden death and no history of syncope.  We previously discussed having her children screened.  Continue metoprolol at present dose.  Note patient cannot ambulate we therefore have not repeated her exercise treadmill to assess systolic blood pressure response with activities.  3 hypertension-blood pressure controlled.  Continue present medications.  Kirk Ruths, MD

## 2019-11-24 ENCOUNTER — Ambulatory Visit: Payer: Medicare Other | Admitting: Cardiology

## 2019-12-09 ENCOUNTER — Ambulatory Visit: Payer: Medicare Other | Admitting: Family Medicine

## 2019-12-09 NOTE — Progress Notes (Deleted)
Alyssa Ingram [place] Phone: [phone] Subjective:    I'm seeing this patient by the request  of:  [name], MD  CC:   [id number]   11/04/2019 Severe arthritic changes of the knee.  Injection given today.  An underlying potential gout is playing a role as well.  Patient does have chronic venous insufficiency and encourage patient to consider the potential for the compression sleeves.  Discussed the potential for viscosupplementation.  Due to other comorbidities patient is not candidate for any surgery.  Follow-up again in 4 to 8 weeks.  More likely a rotator cuff arthropathy.  Once again secondary to comorbidities not a surgical candidate.  No further imaging is necessary because it would not change medical management.  Discussed the potential for formal physical therapy and patient will consider this.  Following up with me again in 4 to 8 weeks for other ailments.  Update 12/09/2019 Alyssa Ingram is a 74 y.o. female coming in with complaint of left knee and right shoulder pain. Patient states       Past Medical History:  Diagnosis Date  . Abnormal MRI, spine 11/2011   ?discitis L5-S1, s/p CT bx 12/12/11  . Allergic rhinitis, cause unspecified 01/31/2013  . Arthritis   . Asthma   . Cervical cancer ([place])   . Chronic diastolic heart failure ([place])   . Diabetic retinopathy ([place])   . GERD (gastroesophageal reflux disease)   . H/O cardiovascular stress test    Nuclear study in 2011 normal perfusion  . HOCM (hypertrophic obstructive cardiomyopathy) ([place])    Echo 12/19/11: Severe LVH, EF 55-65%, dynamic obstruction, wall motion normal, grade 1 diastolic dysfunction, systolic anterior motion of the mitral valve with mild MS and mild MR, mild LAE, PASP 34  . Hyperlipidemia   . Hypertension   . Kidney stones   . Obesity   . Type II or unspecified type diabetes mellitus without mention of complication, uncontrolled  01/31/2013   Past Surgical History:  Procedure Laterality Date  . ABDOMINAL HYSTERECTOMY    . JOINT REPLACEMENT     right knee 2006  . rotater cuff     right, 2005   Social History   Socioeconomic History  . Marital status: Widowed    Spouse name: Not on file  . Number of children: 5  . Years of education: 35  . Highest education level: Not on file  Occupational History  . Occupation: Retired Optometrist  Tobacco Use  . Smoking status: Never Smoker  . Smokeless tobacco: Never Used  Substance and Sexual Activity  . Alcohol use: No    Alcohol/week: 0.0 standard drinks  . Drug use: No  . Sexual activity: Not on file  Other Topics Concern  . Not on file  Social History Narrative  . Not on file   Social Determinants of Health   Financial Resource Strain:   . Difficulty of Paying Living Expenses:   Food Insecurity:   . Worried About Charity fundraiser in the Last Year:   . Arboriculturist in the Last Year:   Transportation Needs:   . Film/video editor (Medical):   Marland Kitchen Lack of Transportation (Non-Medical):   Physical Activity:   . Days of Exercise per Week:   . Minutes of Exercise per Session:   Stress:   . Feeling of Stress :   Social Connections:   . Frequency of Communication with Friends and  Family:   . Frequency of Social Gatherings with Friends and Family:   . Attends Religious Services:   . Active Member of Clubs or Organizations:   . Attends Archivist Meetings:   Marland Kitchen Marital Status:    Allergies  Allergen Reactions  . Sulfa Antibiotics Hives  . Metformin And Related   . Other Hives    Unknown drug  . Prednisone    Family History  Problem Relation Age of Onset  . Heart disease Father   . Arthritis Other   . Heart disease Other   . Hypertension Other   . Diabetes Other   . Alcohol abuse Other   . Arthritis Other   . Cancer Other        Lung Cancer  . Heart disease Other   . Hypertension Other   . Sudden death Other   . Kidney  disease Other   . Mental illness Other   . Diabetes Other   . Colon cancer Neg Hx     Current Outpatient Medications (Endocrine & Metabolic):  .  glipiZIDE (GLUCOTROL XL) 2.5 MG 24 hr tablet, Take 1 tablet (2.5 mg total) by mouth daily with breakfast.  Current Outpatient Medications (Cardiovascular):  .  atorvastatin (LIPITOR) 80 MG tablet, TAKE 1 TABLET BY MOUTH EVERY DAY .  diltiazem (CARDIZEM CD) 180 MG 24 hr capsule, TAKE 1 CAPSULE (180 MG TOTAL) BY MOUTH DAILY. .  furosemide (LASIX) 20 MG tablet, TAKE 3 TABLETS (60 MG TOTAL) BY MOUTH 2 TIMES DAILY. .  metoprolol tartrate (LOPRESSOR) 50 MG tablet, TAKE 1 TABLET BY MOUTH TWICE A DAY  Current Outpatient Medications (Respiratory):  .  albuterol (ACCUNEB) 0.63 MG/3ML nebulizer solution, INHALE 1 VIAL BY MOUTH VIA NEBULIZATION EVERY 6 HOURS AS NEEDED FOR WHEEZING. Marland Kitchen  albuterol (PROAIR HFA) 108 (90 Base) MCG/ACT inhaler, Inhale 2 puffs into the lungs 2 (two) times daily. .  cetirizine (ZYRTEC) 10 MG tablet, TAKE 1 TABLET BY MOUTH EVERY DAY AS NEEDED .  triamcinolone (NASACORT AQ) 55 MCG/ACT AERO nasal inhaler, Place 2 sprays into the nose daily. .  VENTOLIN HFA 108 (90 Base) MCG/ACT inhaler, TAKE 2 PUFFS BY MOUTH TWICE A DAY  Current Outpatient Medications (Analgesics):  .  allopurinol (ZYLOPRIM) 100 MG tablet, Take 1 tablet (100 mg total) by mouth daily. Marland Kitchen  aspirin 81 MG tablet, Take 162 mg by mouth daily.  .  meloxicam (MOBIC) 15 MG tablet, TAKE 1 TABLET (15 MG TOTAL) BY MOUTH DAILY AS NEEDED FOR PAIN. .  traMADol (ULTRAM) 50 MG tablet, Take 1 tablet (50 mg total) by mouth every 6 (six) hours as needed.   Current Outpatient Medications (Other):  .  ammonium lactate (AMLACTIN) 12 % cream, APPLY TOPICALLY AS NEEDED FOR DRY SKIN. Marland Kitchen  Blood Glucose Monitoring Suppl (ONETOUCH VERIO) w/Device KIT, Use as directed once daily E11.9 .  Continuous Blood Gluc Receiver (FREESTYLE LIBRE READER) DEVI, Apply 1 Device topically 4 (four) times daily as  needed. E11.9 .  Continuous Blood Gluc Sensor (FREESTYLE LIBRE 14 DAY SENSOR) MISC, Apply 1 Device topically every 14 (fourteen) days. E11.9 .  DULoxetine (CYMBALTA) 30 MG capsule, Take 1 capsule (30 mg total) by mouth daily. Marland Kitchen  gabapentin (NEURONTIN) 300 MG capsule, TAKE 1 TO 2 CAPSULES BY MOUTH AT BEDTIME .  glucose blood test strip, Use as instructed once daily E11.9 .  lidocaine (LIDODERM) 5 %, Place 1 patch onto the skin daily. Remove & Discard patch within 12 hours or as directed  by MD .  meclizine (ANTIVERT) 25 MG tablet, TAKE 1 TABLET BY MOUTH THREE TIMES A DAY .  NON FORMULARY, [place] APOTHECARY  CREAMS- #11 (PERIPHERAL NEUROPATHY CREAM) .  ONETOUCH DELICA PLUS LANCETS MISC, Apply 1 Device topically daily. Use to check lood sugar once daily E11.9 .  pantoprazole (PROTONIX) 40 MG tablet, Take 1 tablet (40 mg total) by mouth daily. Marland Kitchen  tiZANidine (ZANAFLEX) 2 MG tablet, Take 1 tablet (2 mg total) by mouth every 6 (six) hours as needed for muscle spasms.   Reviewed prior external information including notes and imaging from  primary care provider As well as notes that were available from care everywhere and other healthcare systems.  Past medical history, social, surgical and family history all reviewed in electronic medical record.  No pertanent information unless stated regarding to the chief complaint.   Review of Systems:  No headache, visual changes, nausea, vomiting, diarrhea, constipation, dizziness, abdominal pain, skin rash, fevers, chills, night sweats, weight loss, swollen lymph nodes, body aches, joint swelling, chest pain, shortness of breath, mood changes. POSITIVE muscle aches  Objective  There were no vitals taken for this visit.   General: No apparent distress alert and oriented x3 mood and affect normal, dressed appropriately.  HEENT: Pupils equal, extraocular movements intact  Respiratory: Patient's speak in full sentences and does not appear short of breath    Cardiovascular: No lower extremity edema, non tender, no erythema  Neuro: Cranial nerves II through XII are intact, neurovascularly intact in all extremities with 2+ DTRs and 2+ pulses.  Gait normal with good balance and coordination.  MSK:  Non tender with full range of motion and good stability and symmetric strength and tone of shoulders, elbows, wrist, hip, knee and ankles bilaterally.     Impression and Recommendations:     The above documentation has been reviewed and is accurate and complete Alyssa Ingram       Note: This dictation was prepared with Dragon dictation along with smaller phrase technology. Any transcriptional errors that result from this process are unintentional.

## 2019-12-12 ENCOUNTER — Other Ambulatory Visit: Payer: Self-pay | Admitting: Internal Medicine

## 2019-12-12 NOTE — Telephone Encounter (Signed)
Please refill as per office routine med refill policy (all routine meds refilled for 3 mo or monthly per pt preference up to one year from last visit, then month to month grace period for 3 mo, then further med refills will have to be denied)  

## 2019-12-19 ENCOUNTER — Ambulatory Visit: Payer: Medicare Other | Admitting: Physician Assistant

## 2019-12-31 ENCOUNTER — Telehealth: Payer: Self-pay | Admitting: Internal Medicine

## 2019-12-31 NOTE — Telephone Encounter (Signed)
New Message:   Pt is calling and states she needs a prescription sent for a diabetic kit. Pt states to please send the most simplest meter nothing complicated. Pt states she needs this as soon as possible. Please advise.

## 2020-01-01 NOTE — Telephone Encounter (Signed)
Sent to Dr. John. 

## 2020-01-01 NOTE — Telephone Encounter (Signed)
Ok to please ask pt to call her insurance or maybe ask at the pharmacy about which glucometer would be covered, bc there are so many I would not know this

## 2020-01-09 ENCOUNTER — Other Ambulatory Visit: Payer: Self-pay

## 2020-01-09 MED ORDER — FREESTYLE LIBRE 14 DAY SENSOR MISC: 1.0000 | 3 refills | Status: DC

## 2020-01-09 MED ORDER — FREESTYLE LIBRE READER DEVI: 1.0000 | Freq: Four times a day (QID) | 0 refills | Status: DC | PRN

## 2020-01-09 NOTE — Telephone Encounter (Signed)
Tried calling pt to inform he of the new glucometer that has been re-sent to her pharmacy on file as the [facility] off [address].  **The device sent is the [employer] 2 with 14 day sensors.

## 2020-01-12 NOTE — Progress Notes (Deleted)
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  Patient seen previously but not since May 2018. Patient previously lived in [place]. Records from there include: Nuclear study in November of 2011 showed an ejection fraction of 62% and normal perfusion. Exercise treadmill February 2014 showed poor exercise capacity and study was technically difficult. However no hypotension noted. Carotid Dopplers January 2014 showed no significant carotid disease. 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 Echo 2/17 vigorous LV function; severe asymmetric LVH, grade 1 DD; severe LAE; findings cw HOCM.  Most recent echocardiogram May 2018 showed severe asymmetric basal septal hypertrophy with vigorous LV function, mild LVOT gradient of 17 mmHg peak, grade 1 diastolic dysfunction, mild mitral stenosis with mean gradient 4 mmHg and mild mitral regurgitation, moderate to severe left atrial enlargement.  Current Outpatient Medications  Medication Sig Dispense Refill  . albuterol (ACCUNEB) 0.63 MG/3ML nebulizer solution INHALE 1 VIAL BY MOUTH VIA NEBULIZATION EVERY 6 HOURS AS NEEDED FOR WHEEZING. 75 mL 12  . albuterol (PROAIR HFA) 108 (90 Base) MCG/ACT inhaler Inhale 2 puffs into the lungs 2 (two) times daily. 8.5 Inhaler 2  . albuterol (VENTOLIN HFA) 108 (90 Base) MCG/ACT inhaler TAKE 2 PUFFS BY MOUTH TWICE A DAY 18 g 2  . allopurinol (ZYLOPRIM) 100 MG tablet Take 1 tablet (100 mg total) by mouth daily. 90 tablet 3  . ammonium lactate (AMLACTIN) 12 % cream APPLY TOPICALLY AS NEEDED FOR DRY SKIN. 385 g 0  . aspirin 81 MG tablet Take 162 mg by mouth daily.     Marland Kitchen atorvastatin (LIPITOR) 80 MG tablet TAKE 1 TABLET BY MOUTH EVERY DAY 90 tablet 2  . Blood Glucose Monitoring Suppl (ONETOUCH VERIO) w/Device KIT Use as directed once daily E11.9 1 kit 0  . cetirizine (ZYRTEC) 10  MG tablet TAKE 1 TABLET BY MOUTH EVERY DAY AS NEEDED 90 tablet 3  . Continuous Blood Gluc Receiver (FREESTYLE LIBRE READER) DEVI Apply 1 Device topically 4 (four) times daily as needed. E11.9 1 each 0  . Continuous Blood Gluc Sensor (FREESTYLE LIBRE 14 DAY SENSOR) MISC Apply 1 Device topically every 14 (fourteen) days. E11.9 6 each 3  . diltiazem (CARDIZEM CD) 180 MG 24 hr capsule TAKE 1 CAPSULE (180 MG TOTAL) BY MOUTH DAILY. 90 capsule 2  . DULoxetine (CYMBALTA) 30 MG capsule Take 1 capsule (30 mg total) by mouth daily. 90 capsule 3  . furosemide (LASIX) 20 MG tablet TAKE 3 TABLETS (60 MG TOTAL) BY MOUTH 2 TIMES DAILY. 180 tablet 1  . gabapentin (NEURONTIN) 300 MG capsule TAKE 1 TO 2 CAPSULES BY MOUTH AT BEDTIME 180 capsule 1  . glipiZIDE (GLUCOTROL XL) 2.5 MG 24 hr tablet Take 1 tablet (2.5 mg total) by mouth daily with breakfast. 90 tablet 3  . glucose blood test strip Use as instructed once daily E11.9 100 each 12  . lidocaine (LIDODERM) 5 % Place 1 patch onto the skin daily. Remove & Discard patch within 12 hours or as directed by MD 40 patch 1  . meclizine (ANTIVERT) 25 MG tablet TAKE 1 TABLET BY MOUTH THREE TIMES A DAY 30 tablet 5  . meloxicam (MOBIC) 15 MG tablet TAKE 1 TABLET (15 MG TOTAL) BY MOUTH DAILY AS NEEDED FOR PAIN. 90 tablet 1  . metoprolol tartrate (LOPRESSOR) 50 MG tablet TAKE 1 TABLET BY MOUTH TWICE A DAY 180 tablet 2  . NON  FORMULARY [place] APOTHECARY  CREAMS- #11 (PERIPHERAL NEUROPATHY CREAM)    . ONETOUCH DELICA PLUS LANCETS MISC Apply 1 Device topically daily. Use to check lood sugar once daily E11.9 100 each 3  . pantoprazole (PROTONIX) 40 MG tablet Take 1 tablet (40 mg total) by mouth daily. 90 tablet 3  . tiZANidine (ZANAFLEX) 2 MG tablet Take 1 tablet (2 mg total) by mouth every 6 (six) hours as needed for muscle spasms. 40 tablet 1  . traMADol (ULTRAM) 50 MG tablet Take 1 tablet (50 mg total) by mouth every 6 (six) hours as needed. 30 tablet 0  . triamcinolone  (NASACORT AQ) 55 MCG/ACT AERO nasal inhaler Place 2 sprays into the nose daily. 3 Inhaler 3   No current facility-administered medications for this visit.    Allergies  Allergen Reactions  . Sulfa Antibiotics Hives  . Metformin And Related   . Other Hives    Unknown drug  . Prednisone     Past Medical History:  Diagnosis Date  . Abnormal MRI, spine 11/2011   ?discitis L5-S1, s/p CT bx 12/12/11  . Allergic rhinitis, cause unspecified 01/31/2013  . Arthritis   . Asthma   . Cervical cancer ([place])   . Chronic diastolic heart failure ([place])   . Diabetic retinopathy ([place])   . GERD (gastroesophageal reflux disease)   . H/O cardiovascular stress test    Nuclear study in 2011 normal perfusion  . HOCM (hypertrophic obstructive cardiomyopathy) ([place])    Echo 12/19/11: Severe LVH, EF 55-65%, dynamic obstruction, wall motion normal, grade 1 diastolic dysfunction, systolic anterior motion of the mitral valve with mild MS and mild MR, mild LAE, PASP 34  . Hyperlipidemia   . Hypertension   . Kidney stones   . Obesity   . Type II or unspecified type diabetes mellitus without mention of complication, uncontrolled 01/31/2013    Past Surgical History:  Procedure Laterality Date  . ABDOMINAL HYSTERECTOMY    . JOINT REPLACEMENT     right knee 2006  . rotater cuff     right, 2005    Social History   Socioeconomic History  . Marital status: Widowed    Spouse name: Not on file  . Number of children: 5  . Years of education: 6  . Highest education level: Not on file  Occupational History  . Occupation: Retired Optometrist  Tobacco Use  . Smoking status: Never Smoker  . Smokeless tobacco: Never Used  Substance and Sexual Activity  . Alcohol use: No    Alcohol/week: 0.0 standard drinks  . Drug use: No  . Sexual activity: Not on file  Other Topics Concern  . Not on file  Social History Narrative  . Not on file   Social Determinants of Health   Financial Resource Strain:   .  Difficulty of Paying Living Expenses: Not on file  Food Insecurity:   . Worried About Charity fundraiser in the Last Year: Not on file  . Ran Out of Food in the Last Year: Not on file  Transportation Needs:   . Lack of Transportation (Medical): Not on file  . Lack of Transportation (Non-Medical): Not on file  Physical Activity:   . Days of Exercise per Week: Not on file  . Minutes of Exercise per Session: Not on file  Stress:   . Feeling of Stress : Not on file  Social Connections:   . Frequency of Communication with Friends and Family: Not on file  . Frequency  of Social Gatherings with Friends and Family: Not on file  . Attends Religious Services: Not on file  . Active Member of Clubs or Organizations: Not on file  . Attends Archivist Meetings: Not on file  . Marital Status: Not on file  Intimate Partner Violence:   . Fear of Current or Ex-Partner: Not on file  . Emotionally Abused: Not on file  . Physically Abused: Not on file  . Sexually Abused: Not on file    Family History  Problem Relation Age of Onset  . Heart disease Father   . Arthritis Other   . Heart disease Other   . Hypertension Other   . Diabetes Other   . Alcohol abuse Other   . Arthritis Other   . Cancer Other        Lung Cancer  . Heart disease Other   . Hypertension Other   . Sudden death Other   . Kidney disease Other   . Mental illness Other   . Diabetes Other   . Colon cancer Neg Hx     ROS: no fevers or chills, productive cough, hemoptysis, dysphasia, odynophagia, melena, hematochezia, dysuria, hematuria, rash, seizure activity, orthopnea, PND, pedal edema, claudication. Remaining systems are negative.  Physical Exam:   There were no vitals taken for this visit.  General:  Well developed/well nourished in NAD Skin warm/dry Patient not depressed No peripheral clubbing Back-normal HEENT-normal/normal eyelids Neck supple/normal carotid upstroke bilaterally; no bruits; no JVD; no  thyromegaly chest - CTA/ normal expansion CV - RRR/normal S1 and S2; no murmurs, rubs or gallops;  PMI nondisplaced Abdomen -NT/ND, no HSM, no mass, + bowel sounds, no bruit 2+ femoral pulses, no bruits Ext-no edema, chords, 2+ DP Neuro-grossly nonfocal  ECG - personally reviewed  A/P  1 hypertrophic obstructive cardiomyopathy-patient doing well from a symptomatic standpoint.  We will plan to proceed with an echocardiogram to reassess.  Note there is no family history of sudden death and no history of syncope.  Continue beta-blocker and calcium blocker.  We again discussed the importance of having her children screened.  2 hypertension-patient's blood pressure is controlled.  Continue present medications.  3 chronic diastolic congestive heart failure-patient appears to be euvolemic today.  Continue diuretic at present dose.  Kirk Ruths, MD

## 2020-01-15 ENCOUNTER — Ambulatory Visit: Payer: Medicare Other | Admitting: Cardiology

## 2020-01-29 ENCOUNTER — Ambulatory Visit: Payer: Medicare Other | Admitting: Family Medicine

## 2020-01-29 ENCOUNTER — Ambulatory Visit: Payer: Medicare Other | Admitting: Internal Medicine

## 2020-01-29 NOTE — Progress Notes (Deleted)
    Alyssa Ingram is a 74 y.o. female who presents to [place] at [facility] today for f/u of L knee pain.  She was last seen by Dr. Tamala Ingram on 11/04/19 and had a L knee injection.  Since her last visit w/ Dr. Tamala Ingram, pt reports   Diagnostic imaging: R and L knee XR- 08/06/14  Pertinent review of systems: ***  Relevant historical information: ***   Exam:  There were no vitals taken for this visit. General: Well Developed, well nourished, and in no acute distress.   MSK: ***    Lab and Radiology Results No results found for this or any previous visit (from the past 72 hour(s)). No results found.     Assessment and Plan: 74 y.o. female with ***   PDMP not reviewed this encounter. No orders of the defined types were placed in this encounter.  No orders of the defined types were placed in this encounter.    Discussed warning signs or symptoms. Please see discharge instructions. Patient expresses understanding.   ***

## 2020-02-04 NOTE — Progress Notes (Deleted)
Subjective:    Patient ID: Alyssa Ingram, female    DOB: 1946-04-30, 74 y.o.   MRN: [id number]  HPI The patient is here for an acute visit.   Right shoulder pain:  She has seen Dr Tamala Julian for this in the past and had injections.  She last saw him 10/2019.  He thought it was rotator cuff arthropathy and discussed PT.       Medications and allergies reviewed with patient and updated if appropriate.  Patient Active Problem List   Diagnosis Date Noted  . Throat pain 11/04/2019  . Neuropathy 11/04/2019  . Insomnia 03/09/2019  . Daytime somnolence 07/17/2018  . Paresthesia of bilateral legs 07/17/2018  . Acute pharyngitis 07/17/2018  . Greater trochanteric bursitis of left hip 01/30/2018  . Low back pain 06/21/2017  . Lateral epicondylitis of right elbow 06/20/2017  . HLD (hyperlipidemia) 08/24/2016  . Right shoulder pain 08/24/2016  . Debility 08/10/2016  . Viral illness 06/13/2016  . Headache 12/09/2015  . Degenerative arthritis of left knee 11/23/2015  . Acute renal failure syndrome ([place]) 02/04/2015  . Bilateral foot pain 01/26/2015  . History of knee replacement procedure of right knee 08/06/2014  . Arthritis of left lower extremity 08/06/2014  . Cellulitis of leg, left 04/14/2014  . Acute bronchitis 04/14/2014  . Chronic venous insufficiency 04/14/2014  . Left leg pain 03/12/2014  . Patellar tendonitis of left knee 07/31/2013  . Superficial phlebitis 01/31/2013  . Chronic pain of right knee 01/31/2013  . Diabetes ([place]) 01/31/2013  . Allergic rhinitis 01/31/2013  . Hypertrophic obstructive cardiomyopathy ([place]) 05/03/2012  . Left lumbar radiculopathy 11/22/2011  . Right knee pain 09/13/2011  . Cough 09/13/2011  . Arthritis   . Mild persistent asthma   . Cervical cancer ([place])   . GERD (gastroesophageal reflux disease)   . Hypertension   . Kidney stones   . CHF (congestive heart failure) ([place])   . Preventative health care 09/10/2011    Current Outpatient  Medications on File Prior to Visit  Medication Sig Dispense Refill  . albuterol (ACCUNEB) 0.63 MG/3ML nebulizer solution INHALE 1 VIAL BY MOUTH VIA NEBULIZATION EVERY 6 HOURS AS NEEDED FOR WHEEZING. 75 mL 12  . albuterol (PROAIR HFA) 108 (90 Base) MCG/ACT inhaler Inhale 2 puffs into the lungs 2 (two) times daily. 8.5 Inhaler 2  . albuterol (VENTOLIN HFA) 108 (90 Base) MCG/ACT inhaler TAKE 2 PUFFS BY MOUTH TWICE A DAY 18 g 2  . allopurinol (ZYLOPRIM) 100 MG tablet Take 1 tablet (100 mg total) by mouth daily. 90 tablet 3  . ammonium lactate (AMLACTIN) 12 % cream APPLY TOPICALLY AS NEEDED FOR DRY SKIN. 385 g 0  . aspirin 81 MG tablet Take 162 mg by mouth daily.     Marland Kitchen atorvastatin (LIPITOR) 80 MG tablet TAKE 1 TABLET BY MOUTH EVERY DAY 90 tablet 2  . Blood Glucose Monitoring Suppl (ONETOUCH VERIO) w/Device KIT Use as directed once daily E11.9 1 kit 0  . cetirizine (ZYRTEC) 10 MG tablet TAKE 1 TABLET BY MOUTH EVERY DAY AS NEEDED 90 tablet 3  . Continuous Blood Gluc Receiver (FREESTYLE LIBRE READER) DEVI Apply 1 Device topically 4 (four) times daily as needed. E11.9 1 each 0  . Continuous Blood Gluc Sensor (FREESTYLE LIBRE 14 DAY SENSOR) MISC Apply 1 Device topically every 14 (fourteen) days. E11.9 6 each 3  . diltiazem (CARDIZEM CD) 180 MG 24 hr capsule TAKE 1 CAPSULE (180 MG TOTAL) BY MOUTH DAILY. 90 capsule 2  .  DULoxetine (CYMBALTA) 30 MG capsule Take 1 capsule (30 mg total) by mouth daily. 90 capsule 3  . furosemide (LASIX) 20 MG tablet TAKE 3 TABLETS (60 MG TOTAL) BY MOUTH 2 TIMES DAILY. 180 tablet 1  . gabapentin (NEURONTIN) 300 MG capsule TAKE 1 TO 2 CAPSULES BY MOUTH AT BEDTIME 180 capsule 1  . glipiZIDE (GLUCOTROL XL) 2.5 MG 24 hr tablet Take 1 tablet (2.5 mg total) by mouth daily with breakfast. 90 tablet 3  . glucose blood test strip Use as instructed once daily E11.9 100 each 12  . lidocaine (LIDODERM) 5 % Place 1 patch onto the skin daily. Remove & Discard patch within 12 hours or as  directed by MD 40 patch 1  . meclizine (ANTIVERT) 25 MG tablet TAKE 1 TABLET BY MOUTH THREE TIMES A DAY 30 tablet 5  . meloxicam (MOBIC) 15 MG tablet TAKE 1 TABLET (15 MG TOTAL) BY MOUTH DAILY AS NEEDED FOR PAIN. 90 tablet 1  . metoprolol tartrate (LOPRESSOR) 50 MG tablet TAKE 1 TABLET BY MOUTH TWICE A DAY 180 tablet 2  . NON FORMULARY  APOTHECARY  CREAMS- #11 (PERIPHERAL NEUROPATHY CREAM)    . ONETOUCH DELICA PLUS LANCETS MISC Apply 1 Device topically daily. Use to check lood sugar once daily E11.9 100 each 3  . pantoprazole (PROTONIX) 40 MG tablet Take 1 tablet (40 mg total) by mouth daily. 90 tablet 3  . tiZANidine (ZANAFLEX) 2 MG tablet Take 1 tablet (2 mg total) by mouth every 6 (six) hours as needed for muscle spasms. 40 tablet 1  . traMADol (ULTRAM) 50 MG tablet Take 1 tablet (50 mg total) by mouth every 6 (six) hours as needed. 30 tablet 0  . triamcinolone (NASACORT AQ) 55 MCG/ACT AERO nasal inhaler Place 2 sprays into the nose daily. 3 Inhaler 3   No current facility-administered medications on file prior to visit.    Past Medical History:  Diagnosis Date  . Abnormal MRI, spine 11/2011   ?discitis L5-S1, s/p CT bx 12/12/11  . Allergic rhinitis, cause unspecified 01/31/2013  . Arthritis   . Asthma   . Cervical cancer ([place])   . Chronic diastolic heart failure ([place])   . Diabetic retinopathy ([place])   . GERD (gastroesophageal reflux disease)   . H/O cardiovascular stress test    Nuclear study in 2011 normal perfusion  . HOCM (hypertrophic obstructive cardiomyopathy) ([place])    Echo 12/19/11: Severe LVH, EF 55-65%, dynamic obstruction, wall motion normal, grade 1 diastolic dysfunction, systolic anterior motion of the mitral valve with mild MS and mild MR, mild LAE, PASP 34  . Hyperlipidemia   . Hypertension   . Kidney stones   . Obesity   . Type II or unspecified type diabetes mellitus without mention of complication, uncontrolled 01/31/2013    Past Surgical History:    Procedure Laterality Date  . ABDOMINAL HYSTERECTOMY    . JOINT REPLACEMENT     right knee 2006  . rotater cuff     right, 2005    Social History   Socioeconomic History  . Marital status: Widowed    Spouse name: Not on file  . Number of children: 5  . Years of education: 57  . Highest education level: Not on file  Occupational History  . Occupation: Retired Optometrist  Tobacco Use  . Smoking status: Never Smoker  . Smokeless tobacco: Never Used  Substance and Sexual Activity  . Alcohol use: No    Alcohol/week: 0.0 standard drinks  .  Drug use: No  . Sexual activity: Not on file  Other Topics Concern  . Not on file  Social History Narrative  . Not on file   Social Determinants of Health   Financial Resource Strain:   . Difficulty of Paying Living Expenses: Not on file  Food Insecurity:   . Worried About Charity fundraiser in the Last Year: Not on file  . Ran Out of Food in the Last Year: Not on file  Transportation Needs:   . Lack of Transportation (Medical): Not on file  . Lack of Transportation (Non-Medical): Not on file  Physical Activity:   . Days of Exercise per Week: Not on file  . Minutes of Exercise per Session: Not on file  Stress:   . Feeling of Stress : Not on file  Social Connections:   . Frequency of Communication with Friends and Family: Not on file  . Frequency of Social Gatherings with Friends and Family: Not on file  . Attends Religious Services: Not on file  . Active Member of Clubs or Organizations: Not on file  . Attends Archivist Meetings: Not on file  . Marital Status: Not on file    Family History  Problem Relation Age of Onset  . Heart disease Father   . Arthritis Other   . Heart disease Other   . Hypertension Other   . Diabetes Other   . Alcohol abuse Other   . Arthritis Other   . Cancer Other        Lung Cancer  . Heart disease Other   . Hypertension Other   . Sudden death Other   . Kidney disease Other   .  Mental illness Other   . Diabetes Other   . Colon cancer Neg Hx     Review of Systems     Objective:  There were no vitals filed for this visit. BP Readings from Last 3 Encounters:  11/04/19 102/80  11/04/19 100/80  09/04/19 124/68   Wt Readings from Last 3 Encounters:  11/04/19 208 lb (94.3 kg)  09/04/19 226 lb (102.5 kg)  06/17/19 221 lb (100.2 kg)   There is no height or weight on file to calculate BMI.   Physical Exam    A Right Shoulder exam was performed.   SWELLING: none  EFFUSION: no  WARMTH: no warmth  TENDERNESS: no tenderness on throughout shoulder joint  ROM: full ROM without pain, there is an occasional pop with certain movements  NEUROLOGICAL EXAM: normal sensation and strength  PULSES: normal        Assessment & Plan:    See Problem List for Assessment and Plan of chronic medical problems.    This visit occurred during the SARS-CoV-2 public health emergency.  Safety protocols were in place, including screening questions prior to the visit, additional usage of staff PPE, and extensive cleaning of exam room while observing appropriate contact time as indicated for disinfecting solutions.

## 2020-02-05 ENCOUNTER — Encounter: Payer: Self-pay | Admitting: Family Medicine

## 2020-02-05 ENCOUNTER — Ambulatory Visit (INDEPENDENT_AMBULATORY_CARE_PROVIDER_SITE_OTHER): Payer: Medicare Other | Admitting: Family Medicine

## 2020-02-05 ENCOUNTER — Ambulatory Visit: Payer: Medicare Other | Admitting: Internal Medicine

## 2020-02-05 ENCOUNTER — Other Ambulatory Visit: Payer: Self-pay

## 2020-02-05 ENCOUNTER — Ambulatory Visit: Payer: Self-pay

## 2020-02-05 VITALS — BP 126/80 | HR 61 | Ht 61.0 in | Wt 217.0 lb

## 2020-02-05 DIAGNOSIS — M25511 Pain in right shoulder: Secondary | ICD-10-CM | POA: Diagnosis not present

## 2020-02-05 DIAGNOSIS — G8929 Other chronic pain: Secondary | ICD-10-CM

## 2020-02-05 DIAGNOSIS — R5381 Other malaise: Secondary | ICD-10-CM | POA: Diagnosis not present

## 2020-02-05 DIAGNOSIS — M1712 Unilateral primary osteoarthritis, left knee: Secondary | ICD-10-CM

## 2020-02-05 DIAGNOSIS — Z0289 Encounter for other administrative examinations: Secondary | ICD-10-CM

## 2020-02-05 NOTE — Progress Notes (Signed)
Rito Ehrlich, am serving as a Education administrator for Dr. [name]. Alyssa Ingram is a 74 y.o. female who presents to [place] at [facility] today for f/u of knee pain.  She was last seen by Dr. Tamala Julian on 11/04/19 for her L knee and R shoulder and had injections in both of these areas.  Since her last visit, pt reports the Left knee that she gets injection every three months from dr Tamala Julian.   Right shoulder left knee are painful today.  Diagnostic imaging: R and L knee XR- 08/06/14  Pertinent review of systems: No fevers or chills  Relevant historical information: Multiple significant medical comorbidities including heart failure cervical cancer history diabetes   Exam:  BP 126/80 (BP Location: Left Arm, Patient Position: Sitting, Cuff Size: Large)   Pulse 61   Ht 5\' 1"  (1.549 m)   Wt 217 lb (98.4 kg)   SpO2 93%   BMI 41.00 kg/m  General: Well Developed, well nourished, and in no acute distress.   MSK: Left knee obese difficult to tell of effusion.  No erythema.  Tender palpation needle and laterally. Decreased motion with crepitation.  Right shoulder decreased muscle bulk.  Decreased motion.  Decreased strength.  Very slow gait using a walker to ambulate.  Lab and Radiology Results  Procedure: Real-time Ultrasound Guided Injection of left knee lateral superior patellar space Device: Philips Affiniti 50G Images permanently stored and available for review in PACS Verbal informed consent obtained.  Discussed risks and benefits of procedure. Warned about infection bleeding damage to structures skin hypopigmentation and fat atrophy among others. Patient expresses understanding and agreement Time-out conducted.   Noted no overlying erythema, induration, or other signs of local infection.   Skin prepped in a sterile fashion.   Local anesthesia: Topical Ethyl chloride.   With sterile technique and under real time ultrasound guidance:  40 mg of Kenalog and 2 mL of  Marcaine injected into the knee joint capsule. Fluid seen entering the capsule.   Completed without difficulty   Pain immediately resolved suggesting accurate placement of the medication.   Advised to call if fevers/chills, erythema, induration, drainage, or persistent bleeding.   Images permanently stored and available for review in the ultrasound unit.  Impression: Technically successful ultrasound guided injection.   Procedure: Real-time Ultrasound Guided Injection of right shoulder glenohumeral joint posterior approach Device: Philips Affiniti 50G Images permanently stored and available for review in PACS Verbal informed consent obtained.  Discussed risks and benefits of procedure. Warned about infection bleeding damage to structures skin hypopigmentation and fat atrophy among others. Patient expresses understanding and agreement Time-out conducted.   Noted no overlying erythema, induration, or other signs of local infection.   Skin prepped in a sterile fashion.   Local anesthesia: Topical Ethyl chloride.   With sterile technique and under real time ultrasound guidance:  40 mg of Kenalog and 2 mL of Marcaine injected into the joint. Fluid seen entering the joint.   Completed without difficulty   Pain immediately resolved suggesting accurate placement of the medication.   Advised to call if fevers/chills, erythema, induration, drainage, or persistent bleeding.   Images permanently stored and available for review in the ultrasound unit.  Impression: Technically successful ultrasound guided injection.    Assessment and Plan: 74 y.o. 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  She is done well in the past with recurrent steroid injections and we will repeat those today.  Spent time discussing next steps which would include hyaluronic acid injection for the knee.  She has not a candidate for total knee replacement given her overall poor  health.  In addition to the injections as above today will refer to home health physical therapy.  She may be a reasonable candidate for this for at least some improvement in her gait or functional capacity.   PDMP not reviewed this encounter. Orders Placed This Encounter  Procedures  . [place] LIMITED JOINT SPACE STRUCTURES LOW LEFT(NO LINKED CHARGES)    Order Specific Question:   Reason for Exam (SYMPTOM  OR DIAGNOSIS REQUIRED)    Answer:   [place] inj    Order Specific Question:   Preferred imaging location?    Answer:   [place]  . Ambulatory referral to Home Health    Referral Priority:   Routine    Referral Type:   Home Health Care    Referral Reason:   Specialty Services Required    Requested Specialty:   [place]    Number of Visits Requested:   1   No orders of the defined types were placed in this encounter.    Discussed warning signs or symptoms. Please see discharge instructions. Patient expresses understanding.   The above documentation has been reviewed and is accurate and complete [name], M.D.

## 2020-02-05 NOTE — Patient Instructions (Addendum)
Thank you for coming in today.  Call or go to the ER if you develop a large red swollen joint with extreme pain or oozing puss.   Plan for home health PT.   Recheck with me or Dr Tamala Julian in 3 months or sooner if needed.   We can consider the gel shots for the knee if needed.

## 2020-02-12 ENCOUNTER — Telehealth: Payer: Self-pay | Admitting: Family Medicine

## 2020-02-12 NOTE — Telephone Encounter (Signed)
Agustin Cree from [place] [facility] called. They have called patient multiple times at all #'s given in order to schedule Home Health, pt has not responded.

## 2020-02-12 NOTE — Telephone Encounter (Signed)
Noted  

## 2020-02-19 ENCOUNTER — Telehealth: Payer: Self-pay

## 2020-02-19 ENCOUNTER — Ambulatory Visit: Payer: Medicare Other

## 2020-02-19 NOTE — Telephone Encounter (Signed)
Called patient regarding Phone AWV 02/19/2020 at 12:30p.  No answer and cell # disc.

## 2020-02-20 ENCOUNTER — Telehealth: Payer: Self-pay | Admitting: Family Medicine

## 2020-02-20 MED ORDER — TIZANIDINE HCL 2 MG PO TABS: 2.0000 mg | ORAL_TABLET | Freq: Four times a day (QID) | ORAL | 1 refills | Status: DC | PRN

## 2020-02-20 NOTE — Telephone Encounter (Signed)
Neck step is trial of those gel injections we talked about.  Would you like me to get those authorized for your knees?

## 2020-02-20 NOTE — Telephone Encounter (Signed)
Patient called stating that she has been having a lot of pain in her knees and legs. She asked if something could be prescribed to help with this?   Pharmacy: [place]

## 2020-02-20 NOTE — Telephone Encounter (Signed)
I called Alyssa Ingram about her pain. When discussed her options she elects to continue tylenol arthritis.  She would like to avoid the risks and side effects of NSAIDs and opiates.  She does mention that she has back spasms and pain.  She has been prescribed tizanidine in the past and she would like a refill of that.  That medicine was sent to the pharmacy.  Discussed hyaluronic acid injections.  She would like to see how her knee continues to feel and if it worsens she will let us know and we will authorize the hyaluronic acid injections.

## 2020-02-20 NOTE — Telephone Encounter (Signed)
Pt does want to proceed w/ the gel injections so please work on getting those authorized.  She also would like something for pain for the weekend sent to [place].

## 2020-02-23 DIAGNOSIS — I110 Hypertensive heart disease with heart failure: Secondary | ICD-10-CM | POA: Diagnosis not present

## 2020-02-23 DIAGNOSIS — E1151 Type 2 diabetes mellitus with diabetic peripheral angiopathy without gangrene: Secondary | ICD-10-CM | POA: Diagnosis not present

## 2020-02-23 DIAGNOSIS — Z96651 Presence of right artificial knee joint: Secondary | ICD-10-CM | POA: Diagnosis not present

## 2020-02-23 DIAGNOSIS — E785 Hyperlipidemia, unspecified: Secondary | ICD-10-CM | POA: Diagnosis not present

## 2020-02-23 DIAGNOSIS — I872 Venous insufficiency (chronic) (peripheral): Secondary | ICD-10-CM | POA: Diagnosis not present

## 2020-02-23 DIAGNOSIS — M1712 Unilateral primary osteoarthritis, left knee: Secondary | ICD-10-CM | POA: Diagnosis not present

## 2020-02-23 DIAGNOSIS — G8929 Other chronic pain: Secondary | ICD-10-CM | POA: Diagnosis not present

## 2020-02-23 DIAGNOSIS — I509 Heart failure, unspecified: Secondary | ICD-10-CM | POA: Diagnosis not present

## 2020-02-23 DIAGNOSIS — I421 Obstructive hypertrophic cardiomyopathy: Secondary | ICD-10-CM | POA: Diagnosis not present

## 2020-02-23 DIAGNOSIS — G629 Polyneuropathy, unspecified: Secondary | ICD-10-CM | POA: Diagnosis not present

## 2020-02-23 DIAGNOSIS — Z7982 Long term (current) use of aspirin: Secondary | ICD-10-CM | POA: Diagnosis not present

## 2020-02-23 DIAGNOSIS — K219 Gastro-esophageal reflux disease without esophagitis: Secondary | ICD-10-CM | POA: Diagnosis not present

## 2020-02-23 DIAGNOSIS — M19011 Primary osteoarthritis, right shoulder: Secondary | ICD-10-CM | POA: Diagnosis not present

## 2020-02-24 ENCOUNTER — Telehealth: Payer: Self-pay | Admitting: Family Medicine

## 2020-02-24 NOTE — Telephone Encounter (Signed)
Patient has cancelled PT and is not interested in pursuing at this time. She has had 2 recent deaths in the family related to [place] and does not feel like PT is right for her. She will get back in touch with [place] as needed.

## 2020-02-25 NOTE — Telephone Encounter (Signed)
fyi

## 2020-03-16 NOTE — Progress Notes (Deleted)
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  Patient seen previously but not since May 2018. Patient previously lived in [place]. Records from there include: Nuclear study in November of 2011 showed an ejection fraction of 62% and normal perfusion. Exercise treadmill February 2014 showed poor exercise capacity and study was technically difficult. However no hypotension noted. Carotid Dopplers January 2014 showed no significant carotid disease. 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 Echo 2/17 vigorous LV function; severe asymmetric LVH, grade 1 DD; severe LAE; findings cw HOCM.  Echocardiogram May 2018 showed vigorous LV systolic function, severe asymmetric basal septal hypertrophy with mild LVOT gradient of 17 mmHg, grade 1 diastolic dysfunction, mild mitral stenosis with mean gradient 4 mmHg, mild mitral regurgitation, moderate to severe left atrial enlargement.  Current Outpatient Medications  Medication Sig Dispense Refill  . albuterol (ACCUNEB) 0.63 MG/3ML nebulizer solution INHALE 1 VIAL BY MOUTH VIA NEBULIZATION EVERY 6 HOURS AS NEEDED FOR WHEEZING. 75 mL 12  . albuterol (PROAIR HFA) 108 (90 Base) MCG/ACT inhaler Inhale 2 puffs into the lungs 2 (two) times daily. 8.5 Inhaler 2  . albuterol (VENTOLIN HFA) 108 (90 Base) MCG/ACT inhaler TAKE 2 PUFFS BY MOUTH TWICE A DAY 18 g 2  . allopurinol (ZYLOPRIM) 100 MG tablet Take 1 tablet (100 mg total) by mouth daily. 90 tablet 3  . ammonium lactate (AMLACTIN) 12 % cream APPLY TOPICALLY AS NEEDED FOR DRY SKIN. 385 g 0  . aspirin 81 MG tablet Take 162 mg by mouth daily.     Marland Kitchen atorvastatin (LIPITOR) 80 MG tablet TAKE 1 TABLET BY MOUTH EVERY DAY 90 tablet 2  . Blood Glucose Monitoring Suppl (ONETOUCH VERIO) w/Device KIT Use as directed once daily E11.9 1 kit 0  . cetirizine (ZYRTEC) 10 MG tablet  TAKE 1 TABLET BY MOUTH EVERY DAY AS NEEDED 90 tablet 3  . Continuous Blood Gluc Receiver (FREESTYLE LIBRE READER) DEVI Apply 1 Device topically 4 (four) times daily as needed. E11.9 1 each 0  . Continuous Blood Gluc Sensor (FREESTYLE LIBRE 14 DAY SENSOR) MISC Apply 1 Device topically every 14 (fourteen) days. E11.9 6 each 3  . diltiazem (CARDIZEM CD) 180 MG 24 hr capsule TAKE 1 CAPSULE (180 MG TOTAL) BY MOUTH DAILY. 90 capsule 2  . DULoxetine (CYMBALTA) 30 MG capsule Take 1 capsule (30 mg total) by mouth daily. 90 capsule 3  . furosemide (LASIX) 20 MG tablet TAKE 3 TABLETS (60 MG TOTAL) BY MOUTH 2 TIMES DAILY. 180 tablet 1  . gabapentin (NEURONTIN) 300 MG capsule TAKE 1 TO 2 CAPSULES BY MOUTH AT BEDTIME 180 capsule 1  . glipiZIDE (GLUCOTROL XL) 2.5 MG 24 hr tablet Take 1 tablet (2.5 mg total) by mouth daily with breakfast. 90 tablet 3  . glucose blood test strip Use as instructed once daily E11.9 100 each 12  . lidocaine (LIDODERM) 5 % Place 1 patch onto the skin daily. Remove & Discard patch within 12 hours or as directed by MD 40 patch 1  . meclizine (ANTIVERT) 25 MG tablet TAKE 1 TABLET BY MOUTH THREE TIMES A DAY 30 tablet 5  . meloxicam (MOBIC) 15 MG tablet TAKE 1 TABLET (15 MG TOTAL) BY MOUTH DAILY AS NEEDED FOR PAIN. 90 tablet 1  . metoprolol tartrate (LOPRESSOR) 50 MG tablet TAKE 1 TABLET BY MOUTH TWICE A DAY 180 tablet 2  . NON FORMULARY [facility]  CREAMS- #11 (PERIPHERAL NEUROPATHY CREAM)    . ONETOUCH DELICA PLUS LANCETS MISC Apply 1 Device topically daily. Use to check lood sugar once daily E11.9 100 each 3  . pantoprazole (PROTONIX) 40 MG tablet Take 1 tablet (40 mg total) by mouth daily. 90 tablet 3  . tiZANidine (ZANAFLEX) 2 MG tablet Take 1 tablet (2 mg total) by mouth every 6 (six) hours as needed for muscle spasms. 40 tablet 1  . triamcinolone (NASACORT AQ) 55 MCG/ACT AERO nasal inhaler Place 2 sprays into the nose daily. 3 Inhaler 3   No current facility-administered  medications for this visit.    Allergies  Allergen Reactions  . Sulfa Antibiotics Hives  . Metformin And Related   . Other Hives    Unknown drug  . Prednisone     Past Medical History:  Diagnosis Date  . Abnormal MRI, spine 11/2011   ?discitis L5-S1, s/p CT bx 12/12/11  . Allergic rhinitis, cause unspecified 01/31/2013  . Arthritis   . Asthma   . Cervical cancer ([place])   . Chronic diastolic heart failure ([place])   . Diabetic retinopathy ([place])   . GERD (gastroesophageal reflux disease)   . H/O cardiovascular stress test    Nuclear study in 2011 normal perfusion  . HOCM (hypertrophic obstructive cardiomyopathy) ([place])    Echo 12/19/11: Severe LVH, EF 55-65%, dynamic obstruction, wall motion normal, grade 1 diastolic dysfunction, systolic anterior motion of the mitral valve with mild MS and mild MR, mild LAE, PASP 34  . Hyperlipidemia   . Hypertension   . Kidney stones   . Obesity   . Type II or unspecified type diabetes mellitus without mention of complication, uncontrolled 01/31/2013    Past Surgical History:  Procedure Laterality Date  . ABDOMINAL HYSTERECTOMY    . JOINT REPLACEMENT     right knee 2006  . rotater cuff     right, 2005    Social History   Socioeconomic History  . Marital status: Widowed    Spouse name: Not on file  . Number of children: 5  . Years of education: 53  . Highest education level: Not on file  Occupational History  . Occupation: Retired Optometrist  Tobacco Use  . Smoking status: Never Smoker  . Smokeless tobacco: Never Used  Substance and Sexual Activity  . Alcohol use: No    Alcohol/week: 0.0 standard drinks  . Drug use: No  . Sexual activity: Not on file  Other Topics Concern  . Not on file  Social History Narrative  . Not on file   Social Determinants of Health   Financial Resource Strain:   . Difficulty of Paying Living Expenses: Not on file  Food Insecurity:   . Worried About Charity fundraiser in the Last Year: Not on file   . Ran Out of Food in the Last Year: Not on file  Transportation Needs:   . Lack of Transportation (Medical): Not on file  . Lack of Transportation (Non-Medical): Not on file  Physical Activity:   . Days of Exercise per Week: Not on file  . Minutes of Exercise per Session: Not on file  Stress:   . Feeling of Stress : Not on file  Social Connections:   . Frequency of Communication with Friends and Family: Not on file  . Frequency of Social Gatherings with Friends and Family: Not on file  . Attends Religious Services: Not on file  . Active Member of Clubs or Organizations: Not on  file  . Attends Archivist Meetings: Not on file  . Marital Status: Not on file  Intimate Partner Violence:   . Fear of Current or Ex-Partner: Not on file  . Emotionally Abused: Not on file  . Physically Abused: Not on file  . Sexually Abused: Not on file    Family History  Problem Relation Age of Onset  . Heart disease Father   . Arthritis Other   . Heart disease Other   . Hypertension Other   . Diabetes Other   . Alcohol abuse Other   . Arthritis Other   . Cancer Other        Lung Cancer  . Heart disease Other   . Hypertension Other   . Sudden death Other   . Kidney disease Other   . Mental illness Other   . Diabetes Other   . Colon cancer Neg Hx     ROS: no fevers or chills, productive cough, hemoptysis, dysphasia, odynophagia, melena, hematochezia, dysuria, hematuria, rash, seizure activity, orthopnea, PND, pedal edema, claudication. Remaining systems are negative.  Physical Exam:   There were no vitals taken for this visit.  General:  Well developed/well nourished in NAD Skin warm/dry Patient not depressed No peripheral clubbing Back-normal HEENT-normal/normal eyelids Neck supple/normal carotid upstroke bilaterally; no bruits; no JVD; no thyromegaly chest - CTA/ normal expansion CV - RRR/normal S1 and S2; no murmurs, rubs or gallops;  PMI nondisplaced Abdomen -NT/ND, no  HSM, no mass, + bowel sounds, no bruit 2+ femoral pulses, no bruits Ext-no edema, chords, 2+ DP Neuro-grossly nonfocal  ECG - personally reviewed  A/P  1 hypertrophic obstructive cardiomyopathy-we will plan to repeat echocardiogram.  Note patient has no family history of sudden death and no history of syncope.  Continue beta-blocker at present dose.  2 hypertension-patient's blood pressure is controlled.  Continue metoprolol.  3 chronic diastolic congestive heart failure-patient appears to be euvolemic.  She does have some chronic lymphedema.  Continue diuretic.  Kirk Ruths, MD

## 2020-03-24 ENCOUNTER — Ambulatory Visit: Payer: Medicare Other | Admitting: Cardiology

## 2020-04-01 NOTE — Progress Notes (Deleted)
Referring-[name], MD Reason for referral-hypertrophic cardiomyopathy  HPI: 74 year old female for evaluation of hypertrophic cardiomyopathy at request of Cecilio Asper, MD.  Patient seen previously but not since May 2018. Patient previously lived in [place]. Records from there include: Nuclear study in November of 2011 showed an ejection fraction of 62% and normal perfusion. Exercise treadmill February 2014 showed poor exercise capacity and study was technically difficult. However no hypotension noted. Carotid Dopplers January 2014 showed no significant carotid disease. 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 Echo May 2018 showed vigorous LV function, severe asymmetric septal hypertrophy, LVOT gradient of 17 mmHg peak, grade 1 diastolic dysfunction, mild mitral stenosis with mean gradient 4 mmHg, mild mitral regurgitation, moderate to severe left atrial enlargement.  Since last seen,   Current Outpatient Medications  Medication Sig Dispense Refill  . albuterol (ACCUNEB) 0.63 MG/3ML nebulizer solution INHALE 1 VIAL BY MOUTH VIA NEBULIZATION EVERY 6 HOURS AS NEEDED FOR WHEEZING. 75 mL 12  . albuterol (PROAIR HFA) 108 (90 Base) MCG/ACT inhaler Inhale 2 puffs into the lungs 2 (two) times daily. 8.5 Inhaler 2  . albuterol (VENTOLIN HFA) 108 (90 Base) MCG/ACT inhaler TAKE 2 PUFFS BY MOUTH TWICE A DAY 18 g 2  . allopurinol (ZYLOPRIM) 100 MG tablet Take 1 tablet (100 mg total) by mouth daily. 90 tablet 3  . ammonium lactate (AMLACTIN) 12 % cream APPLY TOPICALLY AS NEEDED FOR DRY SKIN. 385 g 0  . aspirin 81 MG tablet Take 162 mg by mouth daily.     Marland Kitchen atorvastatin (LIPITOR) 80 MG tablet TAKE 1 TABLET BY MOUTH EVERY DAY 90 tablet 2  . Blood Glucose Monitoring Suppl (ONETOUCH VERIO) w/Device KIT Use as directed once daily E11.9 1 kit 0  . cetirizine (ZYRTEC) 10 MG tablet TAKE 1 TABLET BY MOUTH EVERY DAY AS NEEDED 90 tablet 3  . Continuous Blood Gluc Receiver (FREESTYLE LIBRE  READER) DEVI Apply 1 Device topically 4 (four) times daily as needed. E11.9 1 each 0  . Continuous Blood Gluc Sensor (FREESTYLE LIBRE 14 DAY SENSOR) MISC Apply 1 Device topically every 14 (fourteen) days. E11.9 6 each 3  . diltiazem (CARDIZEM CD) 180 MG 24 hr capsule TAKE 1 CAPSULE (180 MG TOTAL) BY MOUTH DAILY. 90 capsule 2  . DULoxetine (CYMBALTA) 30 MG capsule Take 1 capsule (30 mg total) by mouth daily. 90 capsule 3  . furosemide (LASIX) 20 MG tablet TAKE 3 TABLETS (60 MG TOTAL) BY MOUTH 2 TIMES DAILY. 180 tablet 1  . gabapentin (NEURONTIN) 300 MG capsule TAKE 1 TO 2 CAPSULES BY MOUTH AT BEDTIME 180 capsule 1  . glipiZIDE (GLUCOTROL XL) 2.5 MG 24 hr tablet Take 1 tablet (2.5 mg total) by mouth daily with breakfast. 90 tablet 3  . glucose blood test strip Use as instructed once daily E11.9 100 each 12  . lidocaine (LIDODERM) 5 % Place 1 patch onto the skin daily. Remove & Discard patch within 12 hours or as directed by MD 40 patch 1  . meclizine (ANTIVERT) 25 MG tablet TAKE 1 TABLET BY MOUTH THREE TIMES A DAY 30 tablet 5  . meloxicam (MOBIC) 15 MG tablet TAKE 1 TABLET (15 MG TOTAL) BY MOUTH DAILY AS NEEDED FOR PAIN. 90 tablet 1  . metoprolol tartrate (LOPRESSOR) 50 MG tablet TAKE 1 TABLET BY MOUTH TWICE A DAY 180 tablet 2  . NON FORMULARY [place] [facility]  CREAMS- #11 (PERIPHERAL NEUROPATHY CREAM)    . [place]  Apply 1 Device topically daily. Use to check lood sugar once daily E11.9 100 each 3  . pantoprazole (PROTONIX) 40 MG tablet Take 1 tablet (40 mg total) by mouth daily. 90 tablet 3  . tiZANidine (ZANAFLEX) 2 MG tablet Take 1 tablet (2 mg total) by mouth every 6 (six) hours as needed for muscle spasms. 40 tablet 1  . triamcinolone (NASACORT AQ) 55 MCG/ACT AERO nasal inhaler Place 2 sprays into the nose daily. 3 Inhaler 3   No current facility-administered medications for this visit.    Allergies  Allergen Reactions  . Sulfa Antibiotics Hives  . Metformin  And Related   . Other Hives    Unknown drug  . Prednisone     Past Medical History:  Diagnosis Date  . Abnormal MRI, spine 11/2011   ?discitis L5-S1, s/p CT bx 12/12/11  . Allergic rhinitis, cause unspecified 01/31/2013  . Arthritis   . Asthma   . Cervical cancer ([place])   . Chronic diastolic heart failure ([place])   . Diabetic retinopathy ([place])   . GERD (gastroesophageal reflux disease)   . H/O cardiovascular stress test    Nuclear study in 2011 normal perfusion  . HOCM (hypertrophic obstructive cardiomyopathy) ([place])    Echo 12/19/11: Severe LVH, EF 55-65%, dynamic obstruction, wall motion normal, grade 1 diastolic dysfunction, systolic anterior motion of the mitral valve with mild MS and mild MR, mild LAE, PASP 34  . Hyperlipidemia   . Hypertension   . Kidney stones   . Obesity   . Type II or unspecified type diabetes mellitus without mention of complication, uncontrolled 01/31/2013    Past Surgical History:  Procedure Laterality Date  . ABDOMINAL HYSTERECTOMY    . JOINT REPLACEMENT     right knee 2006  . rotater cuff     right, 2005    Social History   Socioeconomic History  . Marital status: Widowed    Spouse name: Not on file  . Number of children: 5  . Years of education: 64  . Highest education level: Not on file  Occupational History  . Occupation: Retired Optometrist  Tobacco Use  . Smoking status: Never Smoker  . Smokeless tobacco: Never Used  Substance and Sexual Activity  . Alcohol use: No    Alcohol/week: 0.0 standard drinks  . Drug use: No  . Sexual activity: Not on file  Other Topics Concern  . Not on file  Social History Narrative  . Not on file   Social Determinants of Health   Financial Resource Strain:   . Difficulty of Paying Living Expenses: Not on file  Food Insecurity:   . Worried About Charity fundraiser in the Last Year: Not on file  . Ran Out of Food in the Last Year: Not on file  Transportation Needs:   . Lack of Transportation  (Medical): Not on file  . Lack of Transportation (Non-Medical): Not on file  Physical Activity:   . Days of Exercise per Week: Not on file  . Minutes of Exercise per Session: Not on file  Stress:   . Feeling of Stress : Not on file  Social Connections:   . Frequency of Communication with Friends and Family: Not on file  . Frequency of Social Gatherings with Friends and Family: Not on file  . Attends Religious Services: Not on file  . Active Member of Clubs or Organizations: Not on file  . Attends Archivist Meetings: Not on file  . Marital  Status: Not on file  Intimate Partner Violence:   . Fear of Current or Ex-Partner: Not on file  . Emotionally Abused: Not on file  . Physically Abused: Not on file  . Sexually Abused: Not on file    Family History  Problem Relation Age of Onset  . Heart disease Father   . Arthritis Other   . Heart disease Other   . Hypertension Other   . Diabetes Other   . Alcohol abuse Other   . Arthritis Other   . Cancer Other        Lung Cancer  . Heart disease Other   . Hypertension Other   . Sudden death Other   . Kidney disease Other   . Mental illness Other   . Diabetes Other   . Colon cancer Neg Hx     ROS: no fevers or chills, productive cough, hemoptysis, dysphasia, odynophagia, melena, hematochezia, dysuria, hematuria, rash, seizure activity, orthopnea, PND, pedal edema, claudication. Remaining systems are negative.  Physical Exam:   There were no vitals taken for this visit.  General:  Well developed/well nourished in NAD Skin warm/dry Patient not depressed No peripheral clubbing Back-normal HEENT-normal/normal eyelids Neck supple/normal carotid upstroke bilaterally; no bruits; no JVD; no thyromegaly chest - CTA/ normal expansion CV - RRR/normal S1 and S2; no murmurs, rubs or gallops;  PMI nondisplaced Abdomen -NT/ND, no HSM, no mass, + bowel sounds, no bruit 2+ femoral pulses, no bruits Ext-no edema, chords, 2+  DP Neuro-grossly nonfocal  ECG - personally reviewed  A/P  1 hypertrophic obstructive cardiomyopathy-plan repeat echocardiogram.  Note there is no family history of sudden death and no history of syncope.  We discussed the importance of having her children screened.  Continue beta-blocker.  2 hypertension-patient's blood pressure is controlled.  Continue present medications and follow.  3 chronic diastolic congestive heart failure-she is euvolemic.  Continue diuretics at present dose.  Kirk Ruths, MD

## 2020-04-03 ENCOUNTER — Other Ambulatory Visit: Payer: Self-pay | Admitting: Internal Medicine

## 2020-04-03 NOTE — Telephone Encounter (Signed)
Please refill as per office routine med refill policy (all routine meds refilled for 3 mo or monthly per pt preference up to one year from last visit, then month to month grace period for 3 mo, then further med refills will have to be denied)  

## 2020-04-13 ENCOUNTER — Ambulatory Visit: Payer: Medicare Other | Admitting: Cardiology

## 2020-04-20 ENCOUNTER — Encounter: Payer: Self-pay | Admitting: General Practice

## 2020-04-21 ENCOUNTER — Ambulatory Visit: Payer: Medicare Other | Admitting: Cardiology

## 2020-05-03 ENCOUNTER — Encounter: Payer: Self-pay | Admitting: Internal Medicine

## 2020-05-03 NOTE — Telephone Encounter (Signed)
error 

## 2020-05-05 NOTE — Progress Notes (Signed)
Alyssa Ingram [place] Phone: [phone] Subjective:   [name], am serving as a scribe for Dr. Hulan Saas. This visit occurred during the SARS-CoV-2 public health emergency.  Safety protocols were in place, including screening questions prior to the visit, additional usage of staff PPE, and extensive cleaning of exam room while observing appropriate contact time as indicated for disinfecting solutions.   I'm seeing this patient by the request  of:  Alyssa Borg, MD  CC: Knee pain, back pain, elbow pain   Patient is accompanied with grandson today.  KGM:[id number]  Alyssa Ingram is a 74 y.o. female coming in with complaint of back pain that radiates down into both hips for past 2 months. No pattern to her pain as it hurts in all positions. Patient was using Zanaflex for pain.   Is having left knee pain as well. Had injection 02/05/2020.  Patient states that did help but now having worsening pain again.  Also right elbow pain after hitting it on something but she cannot recall when. Noticed bruise over olecranon. Pain is intermittent.   MRI lumbar 2013 IMPRESSION:  1. Signal changes around the L5-S1 disc space suspicious for early  infectious diskitis osteomyelitis. Correlation with hematologic  markers of inflammation/infection is recommended. It is possible  that these changes represent severe sterile reaction to the  advanced L5-S1 disc degeneration. If the clinical course is  equivocal, repeat short interval repeat lumbar MRI (e.g. 2 weeks)  may be valuable. There is also chronic spondylolisthesis and facet  arthropathy at this level.  2. Abnormal bone marrow signal is nonspecific and could reflect  benign or malignant etiology (myelodysplasia, other chronic  disease, less likely multiple myeloma).  3. Spondylolisthesis at L4-L5 also associated with disc and facet  degeneration. Subsequent moderate  spinal stenosis with bilateral  lateral recess stenosis and moderate to severe bilateral L4  foraminal stenosis.  4. Multifactorial L3-L4 mild to moderate spinal and bilateral L3  foraminal stenosis.       Past Medical History:  Diagnosis Date  . Abnormal MRI, spine 11/2011   ?discitis L5-S1, s/p CT bx 12/12/11  . Allergic rhinitis, cause unspecified 01/31/2013  . Arthritis   . Asthma   . Cervical cancer ([place])   . Chronic diastolic heart failure ([place])   . Diabetic retinopathy ([place])   . GERD (gastroesophageal reflux disease)   . H/O cardiovascular stress test    Nuclear study in 2011 normal perfusion  . HOCM (hypertrophic obstructive cardiomyopathy) ([place])    Echo 12/19/11: Severe LVH, EF 55-65%, dynamic obstruction, wall motion normal, grade 1 diastolic dysfunction, systolic anterior motion of the mitral valve with mild MS and mild MR, mild LAE, PASP 34  . Hyperlipidemia   . Hypertension   . Kidney stones   . Obesity   . Type II or unspecified type diabetes mellitus without mention of complication, uncontrolled 01/31/2013   Past Surgical History:  Procedure Laterality Date  . ABDOMINAL HYSTERECTOMY    . JOINT REPLACEMENT     right knee 2006  . rotater cuff     right, 2005   Social History   Socioeconomic History  . Marital status: Widowed    Spouse name: Not on file  . Number of children: 5  . Years of education: 40  . Highest education level: Not on file  Occupational History  . Occupation: Retired Optometrist  Tobacco Use  . Smoking status: Never Smoker  .  Smokeless tobacco: Never Used  Substance and Sexual Activity  . Alcohol use: No    Alcohol/week: 0.0 standard drinks  . Drug use: No  . Sexual activity: Not on file  Other Topics Concern  . Not on file  Social History Narrative  . Not on file   Social Determinants of Health   Financial Resource Strain: Not on file  Food Insecurity: Not on file  Transportation Needs: Not on file  Physical Activity: Not on  file  Stress: Not on file  Social Connections: Not on file   Allergies  Allergen Reactions  . Sulfa Antibiotics Hives  . Metformin And Related   . Other Hives    Unknown drug  . Prednisone    Family History  Problem Relation Age of Onset  . Heart disease Father   . Arthritis Other   . Heart disease Other   . Hypertension Other   . Diabetes Other   . Alcohol abuse Other   . Arthritis Other   . Cancer Other        Lung Cancer  . Heart disease Other   . Hypertension Other   . Sudden death Other   . Kidney disease Other   . Mental illness Other   . Diabetes Other   . Colon cancer Neg Hx     Current Outpatient Medications (Endocrine & Metabolic):  .  glipiZIDE (GLUCOTROL XL) 2.5 MG 24 hr tablet, Take 1 tablet (2.5 mg total) by mouth daily with breakfast.  Current Outpatient Medications (Cardiovascular):  .  atorvastatin (LIPITOR) 80 MG tablet, TAKE 1 TABLET BY MOUTH EVERY DAY .  diltiazem (CARDIZEM CD) 180 MG 24 hr capsule, TAKE 1 CAPSULE (180 MG TOTAL) BY MOUTH DAILY. .  furosemide (LASIX) 20 MG tablet, TAKE 3 TABLETS (60 MG TOTAL) BY MOUTH 2 TIMES DAILY. .  metoprolol tartrate (LOPRESSOR) 50 MG tablet, TAKE 1 TABLET BY MOUTH TWICE A DAY  Current Outpatient Medications (Respiratory):  .  albuterol (ACCUNEB) 0.63 MG/3ML nebulizer solution, INHALE 1 VIAL BY MOUTH VIA NEBULIZATION EVERY 6 HOURS AS NEEDED FOR WHEEZING. Marland Kitchen  albuterol (PROAIR HFA) 108 (90 Base) MCG/ACT inhaler, Inhale 2 puffs into the lungs 2 (two) times daily. Marland Kitchen  albuterol (VENTOLIN HFA) 108 (90 Base) MCG/ACT inhaler, TAKE 2 PUFFS BY MOUTH TWICE A DAY .  cetirizine (ZYRTEC) 10 MG tablet, TAKE 1 TABLET BY MOUTH EVERY DAY AS NEEDED .  triamcinolone (NASACORT AQ) 55 MCG/ACT AERO nasal inhaler, Place 2 sprays into the nose daily.  Current Outpatient Medications (Analgesics):  .  allopurinol (ZYLOPRIM) 100 MG tablet, Take 1 tablet (100 mg total) by mouth daily. Marland Kitchen  aspirin 81 MG tablet, Take 162 mg by mouth daily.   .  meloxicam (MOBIC) 15 MG tablet, TAKE 1 TABLET (15 MG TOTAL) BY MOUTH DAILY AS NEEDED FOR PAIN.   Current Outpatient Medications (Other):  Marland Kitchen  ACCU-CHEK AVIVA PLUS test strip, USE TO CHECK BLOOD SUGAR THREE TIMES DAILY .  ammonium lactate (AMLACTIN) 12 % cream, APPLY TOPICALLY AS NEEDED FOR DRY SKIN. Marland Kitchen  Blood Glucose Monitoring Suppl (ONETOUCH VERIO) w/Device KIT, Use as directed once daily E11.9 .  Continuous Blood Gluc Receiver (FREESTYLE LIBRE READER) DEVI, Apply 1 Device topically 4 (four) times daily as needed. E11.9 .  Continuous Blood Gluc Sensor (FREESTYLE LIBRE 14 DAY SENSOR) MISC, Apply 1 Device topically every 14 (fourteen) days. E11.9 .  DULoxetine (CYMBALTA) 30 MG capsule, Take 1 capsule (30 mg total) by mouth daily. Marland Kitchen  gabapentin (  NEURONTIN) 300 MG capsule, TAKE 1 TO 2 CAPSULES BY MOUTH AT BEDTIME .  lidocaine (LIDODERM) 5 %, Place 1 patch onto the skin daily. Remove & Discard patch within 12 hours or as directed by MD .  meclizine (ANTIVERT) 25 MG tablet, TAKE 1 TABLET BY MOUTH THREE TIMES A DAY .  NON FORMULARY, [facility]  CREAMS- #11 (PERIPHERAL NEUROPATHY CREAM) .  ONETOUCH DELICA PLUS LANCETS MISC, Apply 1 Device topically daily. Use to check lood sugar once daily E11.9 .  pantoprazole (PROTONIX) 40 MG tablet, Take 1 tablet (40 mg total) by mouth daily. Marland Kitchen  tiZANidine (ZANAFLEX) 2 MG tablet, Take 1 tablet (2 mg total) by mouth every 6 (six) hours as needed for muscle spasms.   Reviewed prior external information including notes and imaging from  primary care provider As well as notes that were available from care everywhere and other healthcare systems.  Past medical history, social, surgical and family history all reviewed in electronic medical record.  No pertanent information unless stated regarding to the chief complaint.   Review of Systems:  No headache, visual changes, nausea, vomiting, diarrhea, constipation, dizziness, abdominal pain, skin rash,  fevers, chills, night sweats, weight loss, swollen lymph nodes,  joint swelling, chest pain, shortness of breath, mood changes. POSITIVE muscle aches, body aches  Objective  Blood pressure 132/86, pulse (!) 58, height '5\' 1"'  (1.549 m), weight 217 lb (98.4 kg), SpO2 95 %.   General: No apparent distress alert and oriented x3 mood and affect normal, dressed appropriately.  Overweight HEENT: Pupils equal, extraocular movements intact  Respiratory: Patient's speak in full sentences and does not appear short of breath  Cardiovascular: 3+ lower extremity edema but symmetric, mildly tender, no erythema  Gait severely antalgic ambulating with the aid of a rolling walker MSK: Back exam difficult to assess secondary to patient body habitus.  Diffuse tenderness to palpation.  Patient does have more tightness of the left leg compared to contralateral side.  Patient does have some neuropathy noted of both feet.  Significant swelling of the lower extremities also noted pitting edema with no sign of any infectious etiology  Right elbow exam shows the patient does have full extension but difficulty with supination.  Patient does have audible clicking noted.  Patient is diffusely tender.  No significant swelling noted.  Good grip strength.  Mild increase in discomfort though over the lateral epicondylar region.  After informed written and verbal consent, patient was seated on exam table. Left knee was prepped with alcohol swab and utilizing anterolateral approach, patient's left knee space was injected with 4:1  marcaine 0.5%: Kenalog 21m/dL. Patient tolerated the procedure well without immediate complications.    Impression and Recommendations:     The above documentation has been reviewed and is accurate and complete ZLyndal Pulley DO

## 2020-05-06 ENCOUNTER — Other Ambulatory Visit: Payer: Self-pay

## 2020-05-06 ENCOUNTER — Encounter: Payer: Self-pay | Admitting: Family Medicine

## 2020-05-06 ENCOUNTER — Ambulatory Visit (INDEPENDENT_AMBULATORY_CARE_PROVIDER_SITE_OTHER): Payer: Medicare Other | Admitting: Family Medicine

## 2020-05-06 ENCOUNTER — Ambulatory Visit (INDEPENDENT_AMBULATORY_CARE_PROVIDER_SITE_OTHER): Payer: Medicare Other

## 2020-05-06 VITALS — BP 132/86 | HR 58 | Ht 61.0 in | Wt 217.0 lb

## 2020-05-06 DIAGNOSIS — M545 Low back pain, unspecified: Secondary | ICD-10-CM | POA: Diagnosis not present

## 2020-05-06 DIAGNOSIS — M19021 Primary osteoarthritis, right elbow: Secondary | ICD-10-CM | POA: Diagnosis not present

## 2020-05-06 DIAGNOSIS — M5416 Radiculopathy, lumbar region: Secondary | ICD-10-CM | POA: Diagnosis not present

## 2020-05-06 DIAGNOSIS — M25521 Pain in right elbow: Secondary | ICD-10-CM

## 2020-05-06 DIAGNOSIS — M1712 Unilateral primary osteoarthritis, left knee: Secondary | ICD-10-CM

## 2020-05-06 DIAGNOSIS — G629 Polyneuropathy, unspecified: Secondary | ICD-10-CM

## 2020-05-06 NOTE — Assessment & Plan Note (Signed)
Continue the gabapentin

## 2020-05-06 NOTE — Assessment & Plan Note (Addendum)
Patient has had this difficulty previously.  Patient did have an MRI in 2013 showed a potential sacroiliitis and a discitis.  Patient states that is having worsening pain again.  Patient denies any fevers chills or any abnormal weight loss.  We discussed potential laboratory work-up which patient declined.  Patient has been given the muscle relaxer from another provider and I did not feel comfortable increasing dose but she can check with primary care.  Continue the gabapentin for the neuropathy aspect.  Has noticed some more left leg pain.  Patient does not know if the leg pain is secondary to her back or the knee and it is difficult to tell especially with patient's other comorbidities.  At this point I would like to get a repeat x-ray of the lumbar spine to further evaluate and if continuing to have trouble advanced imaging may be warranted.  I do not feel comfortable adding any other medications with her other comorbidities we will refer patient to pain management if they feel that that would be beneficial.  Follow-up with me for the knee in 3 months if helpful.  Patient knows it is well that if any worsening pain and over the holiday weekend she needs to seek medical attention in the emergency department.

## 2020-05-06 NOTE — Assessment & Plan Note (Addendum)
Repeat injection given today patient presented difficult injection.  Patient did not have have any significant pain with it.  Hopefully patient does respond.  So far has been responding every 3 months or so.  Encourage patient to continue to work on weight.  Patient does have significant abnormal thigh to calf ratio and instability of the knee but I do not think that she is a custom brace would fit her.  Due to patient's weight as well as other comorbidities patient is likely not a surgical candidate.  I do believe that some of the lower extremity pain is secondary to the chronic venous insufficiency as well as congestive heart failure.  Discussed with any type of worsening symptoms over the weekend patient would have to go into the emergency room with it being a holiday.  We did address this while patient's grandson was standing at the room door.

## 2020-05-06 NOTE — Assessment & Plan Note (Signed)
Patient does not remember a fall, patient has some decrease in supination of the forearm.  Patient is tender over the olecranon area.  We will get x-rays to rule out any type of fracture but likely some underlying arthritic changes.  Discussed compression and given topical anti-inflammatories.  Depending on findings this could change medical management.  Follow-up again 6 to 12 weeks

## 2020-05-06 NOTE — Patient Instructions (Addendum)
Compression sleeve for elbow Pennsaid 2x a day fingertip sized amount Xray today Will refer to pain management because of your other medications we cannot make big medication changes Injected knee today For the swelling in your leg please talk to cardiologist  Or PCP See me again in 3 months

## 2020-06-03 ENCOUNTER — Telehealth: Payer: Self-pay | Admitting: Internal Medicine

## 2020-06-03 NOTE — Telephone Encounter (Signed)
Patient calling stating she would like for [place] to send in something so she can check her sugars. She said either freestyle libre or detox 6, but would prefer the freestyle libre.  [id number] [place], [place] Phone:  [phone]  Fax:  [phone]

## 2020-06-04 ENCOUNTER — Telehealth: Payer: Self-pay

## 2020-06-04 NOTE — Telephone Encounter (Signed)
Ok but is the [place] covered under her insurance?  I would not want to create multiple emails and pt visits to the pharmacy without knowing this first, thanks

## 2020-06-04 NOTE — Telephone Encounter (Signed)
Called pt to follow up her request phone constantly rung [phone]

## 2020-06-07 MED ORDER — FREESTYLE LIBRE 14 DAY SENSOR MISC
1.0000 | 3 refills | Status: DC
Start: 2020-06-07 — End: 2021-07-28

## 2020-06-07 MED ORDER — FREESTYLE LIBRE READER DEVI
1.0000 | Freq: Four times a day (QID) | 0 refills | Status: DC | PRN
Start: 2020-06-07 — End: 2021-07-28

## 2020-06-07 NOTE — Addendum Note (Signed)
Addended by: [name] on: 06/07/2020 12:46 PM   Modules accepted: Orders

## 2020-06-07 NOTE — Telephone Encounter (Signed)
Patient calling back, checked with insurance and her insurance covers 80% and she is okay with being responsible for the 20%

## 2020-06-07 NOTE — Telephone Encounter (Signed)
Ok this is ordered 

## 2020-06-11 ENCOUNTER — Telehealth: Payer: Self-pay | Admitting: Internal Medicine

## 2020-06-11 MED ORDER — LANCETS MISC: 3 refills | Status: AC

## 2020-06-11 MED ORDER — ONETOUCH ULTRA 2 W/DEVICE KIT: PACK | 0 refills | Status: DC

## 2020-06-11 MED ORDER — ONETOUCH ULTRA VI STRP: ORAL_STRIP | 12 refills | Status: DC

## 2020-06-11 NOTE — Telephone Encounter (Signed)
What???  What changed,  this is the third email now for a glucometer.  The first 2 determined after the pt check with insurance that they WOULD cover the glucometer 80% and she was SOOOO happy to pay the 20%?  Can we ask the patient why this happened?  And how do we know the glucometers mentioned are really covered?  Does the patient really know this because she asked someone?  I dont want to do more than one more email about this,    Thanks

## 2020-06-11 NOTE — Telephone Encounter (Signed)
Done erx 

## 2020-06-11 NOTE — Addendum Note (Signed)
Addended by: [name] on: 06/11/2020 05:01 PM   Modules accepted: Orders

## 2020-06-11 NOTE — Telephone Encounter (Signed)
Patient called and said that her insurance would not cover Continuous Blood Gluc Receiver ([place]) [place]. She said that it would be $300. She said that her insurance would cover Accucheck or One Touch. She was wondering if one of those can be sent to [id number] - [place], [place]

## 2020-06-28 ENCOUNTER — Other Ambulatory Visit: Payer: Self-pay | Admitting: Internal Medicine

## 2020-06-28 NOTE — Telephone Encounter (Signed)
Please refill as per office routine med refill policy (all routine meds refilled for 3 mo or monthly per pt preference up to one year from last visit, then month to month grace period for 3 mo, then further med refills will have to be denied)  

## 2020-07-20 ENCOUNTER — Telehealth: Payer: Self-pay | Admitting: Internal Medicine

## 2020-07-20 NOTE — Telephone Encounter (Signed)
Called pt son back and schedule pt for a in office visit 12 noon

## 2020-07-20 NOTE — Telephone Encounter (Signed)
Patients son, Alyssa Ingram called and said that the patient has been having mobility issues for almost a week. She is also having issues with using her walker. She is not sleeping well, taking medications properly. She also has right shoulder pain to her wrist. Did let the son know that Dr. Jenny Reichmann is out of office. Declined visit. Please advise    Phone: [phone]

## 2020-07-21 ENCOUNTER — Other Ambulatory Visit: Payer: Self-pay

## 2020-07-21 ENCOUNTER — Emergency Department (HOSPITAL_COMMUNITY): Payer: Medicare Other

## 2020-07-21 ENCOUNTER — Telehealth: Payer: Self-pay

## 2020-07-21 ENCOUNTER — Ambulatory Visit: Payer: Medicare Other | Admitting: Family

## 2020-07-21 ENCOUNTER — Inpatient Hospital Stay (HOSPITAL_COMMUNITY)
Admission: EM | Admit: 2020-07-21 | Discharge: 2020-07-27 | DRG: 682 | Disposition: A | Discharge status: Skilled Nursing Facility | Payer: Medicare Other | Attending: Internal Medicine | Admitting: Internal Medicine

## 2020-07-21 ENCOUNTER — Encounter (HOSPITAL_COMMUNITY): Payer: Self-pay | Admitting: Internal Medicine

## 2020-07-21 DIAGNOSIS — I110 Hypertensive heart disease with heart failure: Secondary | ICD-10-CM | POA: Diagnosis present

## 2020-07-21 DIAGNOSIS — R5381 Other malaise: Secondary | ICD-10-CM | POA: Diagnosis not present

## 2020-07-21 DIAGNOSIS — R531 Weakness: Secondary | ICD-10-CM | POA: Diagnosis not present

## 2020-07-21 DIAGNOSIS — Z882 Allergy status to sulfonamides status: Secondary | ICD-10-CM | POA: Diagnosis not present

## 2020-07-21 DIAGNOSIS — Z6841 Body Mass Index (BMI) 40.0 and over, adult: Secondary | ICD-10-CM

## 2020-07-21 DIAGNOSIS — E860 Dehydration: Secondary | ICD-10-CM | POA: Diagnosis not present

## 2020-07-21 DIAGNOSIS — Z79899 Other long term (current) drug therapy: Secondary | ICD-10-CM

## 2020-07-21 DIAGNOSIS — E785 Hyperlipidemia, unspecified: Secondary | ICD-10-CM | POA: Diagnosis not present

## 2020-07-21 DIAGNOSIS — I421 Obstructive hypertrophic cardiomyopathy: Secondary | ICD-10-CM | POA: Diagnosis not present

## 2020-07-21 DIAGNOSIS — Z87442 Personal history of urinary calculi: Secondary | ICD-10-CM | POA: Diagnosis not present

## 2020-07-21 DIAGNOSIS — Z888 Allergy status to other drugs, medicaments and biological substances status: Secondary | ICD-10-CM | POA: Diagnosis not present

## 2020-07-21 DIAGNOSIS — R0602 Shortness of breath: Secondary | ICD-10-CM | POA: Diagnosis not present

## 2020-07-21 DIAGNOSIS — Z8541 Personal history of malignant neoplasm of cervix uteri: Secondary | ICD-10-CM

## 2020-07-21 DIAGNOSIS — I422 Other hypertrophic cardiomyopathy: Secondary | ICD-10-CM | POA: Diagnosis not present

## 2020-07-21 DIAGNOSIS — D72829 Elevated white blood cell count, unspecified: Secondary | ICD-10-CM | POA: Diagnosis present

## 2020-07-21 DIAGNOSIS — I5032 Chronic diastolic (congestive) heart failure: Secondary | ICD-10-CM | POA: Diagnosis present

## 2020-07-21 DIAGNOSIS — Z8249 Family history of ischemic heart disease and other diseases of the circulatory system: Secondary | ICD-10-CM

## 2020-07-21 DIAGNOSIS — R0689 Other abnormalities of breathing: Secondary | ICD-10-CM | POA: Diagnosis not present

## 2020-07-21 DIAGNOSIS — J9601 Acute respiratory failure with hypoxia: Secondary | ICD-10-CM | POA: Diagnosis not present

## 2020-07-21 DIAGNOSIS — J4530 Mild persistent asthma, uncomplicated: Secondary | ICD-10-CM | POA: Diagnosis not present

## 2020-07-21 DIAGNOSIS — M1712 Unilateral primary osteoarthritis, left knee: Secondary | ICD-10-CM | POA: Diagnosis not present

## 2020-07-21 DIAGNOSIS — Z90710 Acquired absence of both cervix and uterus: Secondary | ICD-10-CM | POA: Diagnosis not present

## 2020-07-21 DIAGNOSIS — Z96651 Presence of right artificial knee joint: Secondary | ICD-10-CM | POA: Diagnosis present

## 2020-07-21 DIAGNOSIS — R001 Bradycardia, unspecified: Secondary | ICD-10-CM | POA: Diagnosis not present

## 2020-07-21 DIAGNOSIS — K219 Gastro-esophageal reflux disease without esophagitis: Secondary | ICD-10-CM | POA: Diagnosis present

## 2020-07-21 DIAGNOSIS — E1142 Type 2 diabetes mellitus with diabetic polyneuropathy: Secondary | ICD-10-CM | POA: Diagnosis not present

## 2020-07-21 DIAGNOSIS — I10 Essential (primary) hypertension: Secondary | ICD-10-CM | POA: Diagnosis not present

## 2020-07-21 DIAGNOSIS — E876 Hypokalemia: Secondary | ICD-10-CM | POA: Diagnosis present

## 2020-07-21 DIAGNOSIS — E119 Type 2 diabetes mellitus without complications: Secondary | ICD-10-CM | POA: Diagnosis not present

## 2020-07-21 DIAGNOSIS — R609 Edema, unspecified: Secondary | ICD-10-CM | POA: Diagnosis not present

## 2020-07-21 DIAGNOSIS — N179 Acute kidney failure, unspecified: Secondary | ICD-10-CM | POA: Diagnosis not present

## 2020-07-21 DIAGNOSIS — Z801 Family history of malignant neoplasm of trachea, bronchus and lung: Secondary | ICD-10-CM

## 2020-07-21 DIAGNOSIS — Z833 Family history of diabetes mellitus: Secondary | ICD-10-CM

## 2020-07-21 DIAGNOSIS — Z7984 Long term (current) use of oral hypoglycemic drugs: Secondary | ICD-10-CM

## 2020-07-21 DIAGNOSIS — I509 Heart failure, unspecified: Secondary | ICD-10-CM | POA: Diagnosis not present

## 2020-07-21 DIAGNOSIS — M6281 Muscle weakness (generalized): Secondary | ICD-10-CM | POA: Diagnosis not present

## 2020-07-21 DIAGNOSIS — Z7982 Long term (current) use of aspirin: Secondary | ICD-10-CM

## 2020-07-21 DIAGNOSIS — I248 Other forms of acute ischemic heart disease: Secondary | ICD-10-CM | POA: Diagnosis not present

## 2020-07-21 DIAGNOSIS — Z743 Need for continuous supervision: Secondary | ICD-10-CM | POA: Diagnosis not present

## 2020-07-21 DIAGNOSIS — Z20822 Contact with and (suspected) exposure to covid-19: Secondary | ICD-10-CM | POA: Diagnosis not present

## 2020-07-21 DIAGNOSIS — M545 Low back pain, unspecified: Secondary | ICD-10-CM | POA: Diagnosis not present

## 2020-07-21 DIAGNOSIS — F32A Depression, unspecified: Secondary | ICD-10-CM | POA: Diagnosis not present

## 2020-07-21 DIAGNOSIS — M109 Gout, unspecified: Secondary | ICD-10-CM | POA: Diagnosis not present

## 2020-07-21 DIAGNOSIS — R279 Unspecified lack of coordination: Secondary | ICD-10-CM | POA: Diagnosis not present

## 2020-07-21 DIAGNOSIS — G629 Polyneuropathy, unspecified: Secondary | ICD-10-CM | POA: Diagnosis not present

## 2020-07-21 LAB — COMPREHENSIVE METABOLIC PANEL
ALT: 30 U/L (ref 0–44)
AST: 56 U/L — ABNORMAL HIGH (ref 15–41)
Albumin: 3.2 g/dL — ABNORMAL LOW (ref 3.5–5.0)
Alkaline Phosphatase: 65 U/L (ref 38–126)
Anion gap: 15 (ref 5–15)
BUN: 27 mg/dL — ABNORMAL HIGH (ref 8–23)
CO2: 26 mmol/L (ref 22–32)
Calcium: 8.6 mg/dL — ABNORMAL LOW (ref 8.9–10.3)
Chloride: 99 mmol/L (ref 98–111)
Creatinine, Ser: 1.68 mg/dL — ABNORMAL HIGH (ref 0.44–1.00)
GFR, Estimated: 32 mL/min — ABNORMAL LOW (ref >60)
Glucose, Bld: 134 mg/dL — ABNORMAL HIGH (ref 70–99)
Potassium: 5.1 mmol/L (ref 3.5–5.1)
Sodium: 140 mmol/L (ref 135–145)
Total Bilirubin: 1.9 mg/dL — ABNORMAL HIGH (ref 0.3–1.2)
Total Protein: 7.7 g/dL (ref 6.5–8.1)

## 2020-07-21 LAB — CBC WITH DIFFERENTIAL/PLATELET
Abs Immature Granulocytes: 0.06 10*3/uL (ref 0.00–0.07)
Basophils Absolute: 0 10*3/uL (ref 0.0–0.1)
Basophils Relative: 0 %
Eosinophils Absolute: 0 10*3/uL (ref 0.0–0.5)
Eosinophils Relative: 0 %
HCT: 43.1 % (ref 36.0–46.0)
Hemoglobin: 13.6 g/dL (ref 12.0–15.0)
Immature Granulocytes: 1 %
Lymphocytes Relative: 7 %
Lymphs Abs: 0.9 10*3/uL (ref 0.7–4.0)
MCH: 27.5 pg (ref 26.0–34.0)
MCHC: 31.6 g/dL (ref 30.0–36.0)
MCV: 87.2 fL (ref 80.0–100.0)
Monocytes Absolute: 1 10*3/uL (ref 0.1–1.0)
Monocytes Relative: 8 %
Neutro Abs: 10.6 10*3/uL — ABNORMAL HIGH (ref 1.7–7.7)
Neutrophils Relative %: 84 %
Platelets: 205 10*3/uL (ref 150–400)
RBC: 4.94 MIL/uL (ref 3.87–5.11)
RDW: 14.7 % (ref 11.5–15.5)
WBC: 12.5 10*3/uL — ABNORMAL HIGH (ref 4.0–10.5)
nRBC: 0 % (ref 0.0–0.2)

## 2020-07-21 LAB — URINALYSIS, ROUTINE W REFLEX MICROSCOPIC
Glucose, UA: NEGATIVE mg/dL
Hgb urine dipstick: NEGATIVE
Ketones, ur: 5 mg/dL — AB
Leukocytes,Ua: NEGATIVE
Nitrite: NEGATIVE
Protein, ur: 30 mg/dL — AB
Specific Gravity, Urine: 1.018 (ref 1.005–1.030)
pH: 5 (ref 5.0–8.0)

## 2020-07-21 LAB — RESP PANEL BY RT-PCR (FLU A&B, COVID) ARPGX2
Influenza A by PCR: NEGATIVE
Influenza B by PCR: NEGATIVE
SARS Coronavirus 2 by RT PCR: NEGATIVE

## 2020-07-21 LAB — TROPONIN I (HIGH SENSITIVITY)
Troponin I (High Sensitivity): 62 ng/L — ABNORMAL HIGH (ref <18)
Troponin I (High Sensitivity): 63 ng/L — ABNORMAL HIGH (ref <18)

## 2020-07-21 MED ORDER — ATORVASTATIN CALCIUM 40 MG PO TABS
80.0000 mg | ORAL_TABLET | Freq: Every day | ORAL | Status: DC
  Administered 2020-07-22 – 2020-07-27 (×6): 80 mg via ORAL
  Filled 2020-07-21 (×6): qty 2

## 2020-07-21 MED ORDER — INSULIN ASPART 100 UNIT/ML ~~LOC~~ SOLN
0.0000 [IU] | Freq: Three times a day (TID) | SUBCUTANEOUS | Status: DC
  Administered 2020-07-25 – 2020-07-26 (×3): 1 [IU] via SUBCUTANEOUS
  Filled 2020-07-21: qty 0.09

## 2020-07-21 MED ORDER — SODIUM CHLORIDE 0.9 % IV BOLUS
500.0000 mL | Freq: Once | INTRAVENOUS | Status: AC
  Administered 2020-07-21: 500 mL via INTRAVENOUS

## 2020-07-21 MED ORDER — ENOXAPARIN SODIUM 40 MG/0.4ML ~~LOC~~ SOLN
40.0000 mg | SUBCUTANEOUS | Status: DC
  Administered 2020-07-22 – 2020-07-26 (×6): 40 mg via SUBCUTANEOUS
  Filled 2020-07-21 (×6): qty 0.4

## 2020-07-21 MED ORDER — ALLOPURINOL 100 MG PO TABS
100.0000 mg | ORAL_TABLET | Freq: Every day | ORAL | Status: DC
  Administered 2020-07-22 – 2020-07-27 (×6): 100 mg via ORAL
  Filled 2020-07-21 (×6): qty 1

## 2020-07-21 MED ORDER — ALBUTEROL SULFATE HFA 108 (90 BASE) MCG/ACT IN AERS
2.0000 | INHALATION_SPRAY | Freq: Two times a day (BID) | RESPIRATORY_TRACT | Status: DC
  Administered 2020-07-22 – 2020-07-27 (×10): 2 via RESPIRATORY_TRACT
  Filled 2020-07-21 (×2): qty 6.7

## 2020-07-21 MED ORDER — PANTOPRAZOLE SODIUM 40 MG PO TBEC
40.0000 mg | DELAYED_RELEASE_TABLET | Freq: Every day | ORAL | Status: DC
  Administered 2020-07-22 – 2020-07-27 (×6): 40 mg via ORAL
  Filled 2020-07-21 (×6): qty 1

## 2020-07-21 MED ORDER — GABAPENTIN 300 MG PO CAPS: 300.0000 mg | ORAL_CAPSULE | Freq: Every day | ORAL | Status: DC

## 2020-07-21 MED ORDER — ASPIRIN 81 MG PO CHEW
162.0000 mg | CHEWABLE_TABLET | Freq: Every day | ORAL | Status: DC
  Administered 2020-07-22 – 2020-07-27 (×6): 162 mg via ORAL
  Filled 2020-07-21 (×6): qty 2

## 2020-07-21 MED ORDER — DULOXETINE HCL 30 MG PO CPEP
30.0000 mg | ORAL_CAPSULE | Freq: Every day | ORAL | Status: DC
  Administered 2020-07-22 – 2020-07-27 (×6): 30 mg via ORAL
  Filled 2020-07-21 (×6): qty 1

## 2020-07-21 MED ORDER — METOPROLOL TARTRATE 50 MG PO TABS
50.0000 mg | ORAL_TABLET | Freq: Two times a day (BID) | ORAL | Status: DC
  Administered 2020-07-22 – 2020-07-24 (×6): 50 mg via ORAL
  Filled 2020-07-21 (×8): qty 1

## 2020-07-21 MED ORDER — DILTIAZEM HCL ER COATED BEADS 180 MG PO CP24
180.0000 mg | ORAL_CAPSULE | Freq: Every day | ORAL | Status: DC
  Administered 2020-07-22 – 2020-07-25 (×4): 180 mg via ORAL
  Filled 2020-07-21 (×4): qty 1

## 2020-07-21 MED ORDER — SODIUM CHLORIDE 0.9 % IV SOLN: INTRAVENOUS | Status: DC

## 2020-07-21 NOTE — Telephone Encounter (Signed)
Patient on wanting to reschedule appt....i explained appointments are taken for today, patient will come at the 12 appointment or they states they will go to UC.

## 2020-07-21 NOTE — ED Provider Notes (Signed)
[facility] EMERGENCY DEPARTMENT Provider Note  CSN: [id number] Arrival date & time: 07/21/20 1639    History Chief Complaint  Patient presents with  . Urinary Frequency    HPI  Alyssa Ingram is a 75 y.o. female with history of multiple medical problems presents to the ED via EMS from home. She gives a very disjointed history but states she lives with her son who is on his way here and can provide more history. Patient reports she has not been feeling well for about a week, relates some lower abdominal pain but denies any dysuria or frequency to me. Review of EMR shows phone calls from patient/son to PCP office yesterday and today. From yesterday's phone call, "Patients son, Juanda Crumble called and said that the patient has been having mobility issues for almost a week. She is also having issues with using her walker. She is not sleeping well, taking medications properly. She also has right shoulder pain to her wrist." She denies any arm or shoulder pain to me. She denies fever, CP, N/V/D. She does admit to not sleeping well recently and feeling like her asthma is acting up.    Past Medical History:  Diagnosis Date  . Abnormal MRI, spine 11/2011   ?discitis L5-S1, s/p CT bx 12/12/11  . Allergic rhinitis, cause unspecified 01/31/2013  . Arthritis   . Asthma   . Cervical cancer ([place])   . Chronic diastolic heart failure ([place])   . Diabetic retinopathy ([place])   . GERD (gastroesophageal reflux disease)   . H/O cardiovascular stress test    Nuclear study in 2011 normal perfusion  . HOCM (hypertrophic obstructive cardiomyopathy) ([place])    Echo 12/19/11: Severe LVH, EF 55-65%, dynamic obstruction, wall motion normal, grade 1 diastolic dysfunction, systolic anterior motion of the mitral valve with mild MS and mild MR, mild LAE, PASP 34  . Hyperlipidemia   . Hypertension   . Kidney stones   . Obesity   . Type II or unspecified type diabetes mellitus without mention of complication, uncontrolled  01/31/2013    Past Surgical History:  Procedure Laterality Date  . ABDOMINAL HYSTERECTOMY    . JOINT REPLACEMENT     right knee 2006  . rotater cuff     right, 2005    Family History  Problem Relation Age of Onset  . Heart disease Father   . Arthritis Other   . Heart disease Other   . Hypertension Other   . Diabetes Other   . Alcohol abuse Other   . Arthritis Other   . Cancer Other        Lung Cancer  . Heart disease Other   . Hypertension Other   . Sudden death Other   . Kidney disease Other   . Mental illness Other   . Diabetes Other   . Colon cancer Neg Hx     Social History   Tobacco Use  . Smoking status: Never Smoker  . Smokeless tobacco: Never Used  Substance Use Topics  . Alcohol use: No    Alcohol/week: 0.0 standard drinks  . Drug use: No     Home Medications Prior to Admission medications   Medication Sig Start Date End Date Taking? Authorizing Provider  albuterol (ACCUNEB) 0.63 MG/3ML nebulizer solution INHALE 1 VIAL BY MOUTH VIA NEBULIZATION EVERY 6 HOURS AS NEEDED FOR WHEEZING. 04/03/18   [name], MD  albuterol (PROAIR HFA) 108 (90 Base) MCG/ACT inhaler Inhale 2 puffs into the lungs 2 (  two) times daily. 06/13/16   [name], MD  allopurinol (ZYLOPRIM) 100 MG tablet Take 1 tablet (100 mg total) by mouth daily. 06/13/16   [name], MD  ammonium lactate (AMLACTIN) 12 % cream APPLY TOPICALLY AS NEEDED FOR DRY SKIN. 05/16/18   Trula Slade, DPM  aspirin 81 MG tablet Take 162 mg by mouth daily.     [provider]  atorvastatin (LIPITOR) 80 MG tablet TAKE 1 TABLET BY MOUTH EVERY DAY 08/11/19   [name], MD  Blood Glucose Monitoring Suppl (ONE TOUCH ULTRA 2) w/Device KIT Use as directed three times daily E11.9 06/11/20   [name], MD  cetirizine (ZYRTEC) 10 MG tablet TAKE 1 TABLET BY MOUTH EVERY DAY AS NEEDED 11/06/19   [name], MD  Continuous Blood Gluc Receiver (FREESTYLE LIBRE READER) DEVI Apply 1 Device topically 4  (four) times daily as needed. E11.9 06/07/20   [name], MD  Continuous Blood Gluc Sensor (FREESTYLE LIBRE 14 DAY SENSOR) MISC Apply 1 Device topically every 14 (fourteen) days. E11.9 06/07/20   [name], MD  diltiazem (CARDIZEM CD) 180 MG 24 hr capsule TAKE 1 CAPSULE (180 MG TOTAL) BY MOUTH DAILY. 06/28/20   [name], MD  DULoxetine (CYMBALTA) 30 MG capsule Take 1 capsule (30 mg total) by mouth daily. 11/04/19   [name], MD  furosemide (LASIX) 20 MG tablet TAKE 3 TABLETS (60 MG TOTAL) BY MOUTH 2 TIMES DAILY. 04/06/20   [name], MD  gabapentin (NEURONTIN) 300 MG capsule TAKE 1 TO 2 CAPSULES BY MOUTH AT BEDTIME 09/24/19   [name], MD  glipiZIDE (GLUCOTROL XL) 2.5 MG 24 hr tablet Take 1 tablet (2.5 mg total) by mouth daily with breakfast. 07/11/19   [name], MD  glucose blood [facility] ULTRA) test strip Use as instructed three times daily E11.9 06/11/20   [name], MD  Lancets MISC Use as directed three times daily E11.9 06/11/20   [name], MD  lidocaine (LIDODERM) 5 % Place 1 patch onto the skin daily. Remove & Discard patch within 12 hours or as directed by MD 01/30/18   [name], MD  meclizine (ANTIVERT) 25 MG tablet TAKE 1 TABLET BY MOUTH THREE TIMES A DAY 07/05/18   [name], MD  meloxicam (MOBIC) 15 MG tablet TAKE 1 TABLET (15 MG TOTAL) BY MOUTH DAILY AS NEEDED FOR PAIN. 01/07/18   [name], MD  metoprolol tartrate (LOPRESSOR) 50 MG tablet TAKE 1 TABLET BY MOUTH TWICE A DAY 09/24/19   [name], MD  NON FORMULARY [facility]  CREAMS- #11 (PERIPHERAL NEUROPATHY CREAM)    [provider]  pantoprazole (PROTONIX) 40 MG tablet Take 1 tablet (40 mg total) by mouth daily. 09/05/19   [name], MD  tiZANidine (ZANAFLEX) 2 MG tablet Take 1 tablet (2 mg total) by mouth every 6 (six) hours as needed for muscle spasms. 02/20/20   Gregor Hams, MD  triamcinolone (NASACORT AQ) 55 MCG/ACT AERO nasal inhaler Place 2 sprays into the  nose daily. 12/31/18   [name], MD     Allergies    Sulfa antibiotics, Metformin and related, Other, and Prednisone   Review of Systems   Review of Systems Unable to assess due to mental status.   Physical Exam BP 131/68   Pulse 79   Temp 99.1 F (37.3 C) (Oral)   Resp 17  SpO2 98%   Physical Exam Vitals and nursing note reviewed.  Constitutional:      Appearance: Normal appearance.  HENT:     Head: Normocephalic and atraumatic.     Nose: Nose normal.     Mouth/Throat:     Mouth: Mucous membranes are moist.  Eyes:     Extraocular Movements: Extraocular movements intact.     Conjunctiva/sclera: Conjunctivae normal.  Cardiovascular:     Rate and Rhythm: Normal rate.  Pulmonary:     Effort: Pulmonary effort is normal.     Breath sounds: Normal breath sounds.  Abdominal:     General: Abdomen is flat.     Palpations: Abdomen is soft. There is no mass.     Tenderness: There is no abdominal tenderness. There is no guarding.  Musculoskeletal:        General: No swelling. Normal range of motion.     Cervical back: Neck supple.  Skin:    General: Skin is warm and dry.  Neurological:     General: No focal deficit present.     Mental Status: She is alert. She is disoriented.     Cranial Nerves: No cranial nerve deficit.     Sensory: No sensory deficit.     Motor: No weakness.  Psychiatric:        Mood and Affect: Mood normal.      ED Results / Procedures / Treatments   Labs (all labs ordered are listed, but only abnormal results are displayed) Labs Reviewed  COMPREHENSIVE METABOLIC PANEL - Abnormal; Notable for the following components:      Result Value   Glucose, Bld 134 (*)    BUN 27 (*)    Creatinine, Ser 1.68 (*)    Calcium 8.6 (*)    Albumin 3.2 (*)    AST 56 (*)    Total Bilirubin 1.9 (*)    GFR, Estimated 32 (*)    All other components within normal limits  CBC WITH DIFFERENTIAL/PLATELET - Abnormal; Notable for the following components:    WBC 12.5 (*)    Neutro Abs 10.6 (*)    All other components within normal limits  URINALYSIS, ROUTINE W REFLEX MICROSCOPIC - Abnormal; Notable for the following components:   Color, Urine AMBER (*)    APPearance CLOUDY (*)    Bilirubin Urine SMALL (*)    Ketones, ur 5 (*)    Protein, ur 30 (*)    Bacteria, UA RARE (*)    All other components within normal limits  TROPONIN I (HIGH SENSITIVITY) - Abnormal; Notable for the following components:   Troponin I (High Sensitivity) 62 (*)    All other components within normal limits  TROPONIN I (HIGH SENSITIVITY) - Abnormal; Notable for the following components:   Troponin I (High Sensitivity) 63 (*)    All other components within normal limits  RESP PANEL BY RT-PCR (FLU A&B, COVID) [id number]    EKG EKG Interpretation  Date/Time:  Wednesday July 21 2020 17:46:21 EST Ventricular Rate:  80 PR Interval:    QRS Duration: 88 QT Interval:  407 QTC Calculation: 470 R Axis:   42 Text Interpretation: Sinus rhythm Atrial premature complex Probable left atrial enlargement Probable left ventricular hypertrophy Since last tracing LVH is more apparent Confirmed by Calvert Cantor [phone]) on 07/21/2020 5:54:18 PM   Radiology DG Chest Port 1 View  Result Date: 07/21/2020 CLINICAL DATA:  Shortness of breath EXAM: PORTABLE CHEST 1 VIEW COMPARISON:  12/09/2015 FINDINGS: Cardiac shadow  is enlarged but stable. Mild aortic calcifications are seen. The lungs are clear. Mild central vascular congestion is noted without edema. No bony abnormality is seen. IMPRESSION: Mild central vascular congestion without significant edema. Electronically Signed   By: Inez Catalina M.D.   On: 07/21/2020 18:12    Procedures Procedures  Medications Ordered in the ED Medications  sodium chloride 0.9 % bolus 500 mL (500 mLs Intravenous New Bag/Given 07/21/20 2146)     MDM Rules/Calculators/A&P MDM Patient reported urinary symptoms to EMS, but denies to me. She does have  general weakness and change in mental status based on prior notes. Will check labs, including UA and send for CXR due to reported SOB.  ED Course  I have reviewed the triage vital signs and the nursing notes.  Pertinent labs & imaging results that were available during my care of the patient were reviewed by me and considered in my medical decision making (see chart for details).  Clinical Course as of 07/21/20 2224  Wed Jul 21, 2020  1751 Patient's son is now at bedside. He states the patient has been spending long hours in the bathroom recently, including 20 straight hours sitting on the commode a week ago, she was unable to stand up and so he had to get help from another family member to get her back in bed. She has had this same pattern since then multiple times with prolonged time in the bathroom followed by needing assistance to get up. This is unusual for her. She has been able to ambulate slowly with her walker during that time. He reports she has been complaining of various aches and pains that they have been treating with topical CBD oil with good improvement.  [CS]  3646 CXR with vascular congestion.  [CS]  1856 CBC with mild leukocytosis.  [CS]  1904 CMP with increased Cr compared to previous about a year ago.  [CS]  1905 First Trop is mildly elevated, no previous to compare.  [CS]  2048 Second Trop remains flat.  [CS]  2048 Patient has not had urine output since arrival. Given elevated creatinine may be dehydrated. Will give gentle hydration and ask RN for cath urine.  [CS]  2149 Will discuss admission with hospitalist for AKI, deconditioning and suspected UTI.  [CS]  2214 Spoke with Dr. Hal Hope, Hospitalist who will evaluate for admission.  [CS]  2224 UA without definite signs of infection.  [CS]    Clinical Course User Index [CS] Truddie Hidden, MD    Final Clinical Impression(s) / ED Diagnoses Final diagnoses:  AKI (acute kidney injury) [facility])  Generalized weakness     Rx / DC Orders ED Discharge Orders    None       Truddie Hidden, MD 07/21/20 2224

## 2020-07-21 NOTE — ED Notes (Signed)
Nurse and I cleaned pt peri area, applied barrier cream, vagina and bottom area is very red and raw.  purewick in place

## 2020-07-21 NOTE — ED Triage Notes (Signed)
Pt states she has painful urination for the past 7 days

## 2020-07-21 NOTE — Telephone Encounter (Signed)
Discuss recent calls with Alyssa Ingram, here suggestion is for patient to head over to ED now and patient and son have both be advised.

## 2020-07-21 NOTE — H&P (Signed)
History and Physical    Alyssa Ingram JAS:[id number] DOB: Jun 20, 1945 DOA: 07/21/2020  PCP: [name], MD  Patient coming from: Home.  Chief Complaint: Generalized weakness.  Pain.  HPI: Alyssa Ingram is a 75 y.o. female with history of hypertrophic cardiomyopathy last EF measured in 2018 was 31 to 70% with grade 1 diastolic dysfunction with history of hypertension diabetes mellitus was brought to the ER after patient was found to be increasingly weak difficult to get up off the commode as per the patient's son who provided history to the ER physician.  Over the last 1 week patient has been falling increasingly difficult to ambulate because of weakness finding it difficult to get up from the commode.  Also has been having nonspecific pain all over.  Increase urination.  Denies any chest pain shortness of breath nausea vomiting or diarrhea.  ED Course: In the ER patient appears nonfocal.  Labs are significant for leukocytosis of 12.5 UA unremarkable labs show creatinine is worsened from last 0.9 it is around 1.6.  Chest x-ray shows congestion with ICD troponin of 62 and 63.  Patient was given 5 cc normal saline bolus.  Admitted for generalized weakness dehydration acute renal failure.  Review of Systems: As per HPI, rest all negative.   Past Medical History:  Diagnosis Date  . Abnormal MRI, spine 11/2011   ?discitis L5-S1, s/p CT bx 12/12/11  . Allergic rhinitis, cause unspecified 01/31/2013  . Arthritis   . Asthma   . Cervical cancer ([place])   . Chronic diastolic heart failure ([place])   . Diabetic retinopathy ([place])   . GERD (gastroesophageal reflux disease)   . H/O cardiovascular stress test    Nuclear study in 2011 normal perfusion  . HOCM (hypertrophic obstructive cardiomyopathy) ([place])    Echo 12/19/11: Severe LVH, EF 55-65%, dynamic obstruction, wall motion normal, grade 1 diastolic dysfunction, systolic anterior motion of the mitral valve with mild MS and mild MR, mild LAE, PASP 34   . Hyperlipidemia   . Hypertension   . Kidney stones   . Obesity   . Type II or unspecified type diabetes mellitus without mention of complication, uncontrolled 01/31/2013    Past Surgical History:  Procedure Laterality Date  . ABDOMINAL HYSTERECTOMY    . JOINT REPLACEMENT     right knee 2006  . rotater cuff     right, 2005     reports that she has never smoked. She has never used smokeless tobacco. She reports that she does not drink alcohol and does not use drugs.  Allergies  Allergen Reactions  . Sulfa Antibiotics Hives  . Metformin And Related   . Other Hives    Unknown drug  . Prednisone Swelling    Family History  Problem Relation Age of Onset  . Heart disease Father   . Arthritis Other   . Heart disease Other   . Hypertension Other   . Diabetes Other   . Alcohol abuse Other   . Arthritis Other   . Cancer Other        Lung Cancer  . Heart disease Other   . Hypertension Other   . Sudden death Other   . Kidney disease Other   . Mental illness Other   . Diabetes Other   . Colon cancer Neg Hx     Prior to Admission medications   Medication Sig Start Date End Date Taking? Authorizing Provider  albuterol (PROAIR HFA) 108 (90 Base) MCG/ACT inhaler  Inhale 2 puffs into the lungs 2 (two) times daily. Patient taking differently: Inhale 2 puffs into the lungs 2 (two) times daily. May repeat if needed 06/13/16  Yes [name], MD  allopurinol (ZYLOPRIM) 100 MG tablet Take 1 tablet (100 mg total) by mouth daily. 06/13/16  Yes [name], MD  ammonium lactate (AMLACTIN) 12 % cream APPLY TOPICALLY AS NEEDED FOR DRY SKIN. 05/16/18  Yes Trula Slade, DPM  aspirin 81 MG tablet Take 162 mg by mouth daily.    Yes [provider]  atorvastatin (LIPITOR) 80 MG tablet TAKE 1 TABLET BY MOUTH EVERY DAY 08/11/19  Yes [name], MD  Blood Glucose Monitoring Suppl (ONE TOUCH ULTRA 2) w/Device KIT Use as directed three times daily E11.9 06/11/20  Yes [name],  MD  cetirizine (ZYRTEC) 10 MG tablet TAKE 1 TABLET BY MOUTH EVERY DAY AS NEEDED 11/06/19  Yes [name], MD  Continuous Blood Gluc Receiver (FREESTYLE LIBRE READER) DEVI Apply 1 Device topically 4 (four) times daily as needed. E11.9 06/07/20  Yes [name], MD  Continuous Blood Gluc Sensor (FREESTYLE LIBRE 14 DAY SENSOR) MISC Apply 1 Device topically every 14 (fourteen) days. E11.9 06/07/20  Yes [name], MD  diltiazem (CARDIZEM CD) 180 MG 24 hr capsule TAKE 1 CAPSULE (180 MG TOTAL) BY MOUTH DAILY. 06/28/20  Yes [name], MD  furosemide (LASIX) 20 MG tablet TAKE 3 TABLETS (60 MG TOTAL) BY MOUTH 2 TIMES DAILY. 04/06/20  Yes [name], MD  gabapentin (NEURONTIN) 300 MG capsule TAKE 1 TO 2 CAPSULES BY MOUTH AT BEDTIME 09/24/19  Yes [name], MD  glipiZIDE (GLUCOTROL XL) 2.5 MG 24 hr tablet Take 1 tablet (2.5 mg total) by mouth daily with breakfast. 07/11/19  Yes [name], MD  Lancets MISC Use as directed three times daily E11.9 06/11/20  Yes [name], MD  meclizine (ANTIVERT) 25 MG tablet TAKE 1 TABLET BY MOUTH THREE TIMES A DAY Patient taking differently: Take 25 mg by mouth daily. 07/05/18  Yes [name], MD  metoprolol tartrate (LOPRESSOR) 50 MG tablet TAKE 1 TABLET BY MOUTH TWICE A DAY Patient taking differently: Take 50 mg by mouth 2 (two) times daily. 09/24/19  Yes [name], MD  pantoprazole (PROTONIX) 40 MG tablet Take 1 tablet (40 mg total) by mouth daily. 09/05/19  Yes [name], MD  triamcinolone (NASACORT AQ) 55 MCG/ACT AERO nasal inhaler Place 2 sprays into the nose daily. 12/31/18  Yes [name], MD  albuterol (ACCUNEB) 0.63 MG/3ML nebulizer solution INHALE 1 VIAL BY MOUTH VIA NEBULIZATION EVERY 6 HOURS AS NEEDED FOR WHEEZING. Patient not taking: Reported on 07/21/2020 04/03/18   [name], MD  DULoxetine (CYMBALTA) 30 MG capsule Take 1 capsule (30 mg total) by mouth daily. Patient not taking: Reported on 07/21/2020 11/04/19   [name], MD   glucose blood [facility] ULTRA) test strip Use as instructed three times daily E11.9 Patient not taking: Reported on 07/21/2020 06/11/20   [name], MD  lidocaine (LIDODERM) 5 % Place 1 patch onto the skin daily. Remove & Discard patch within 12 hours or as directed by MD Patient not taking: No sig reported 01/30/18   [name], MD  meloxicam (MOBIC) 15 MG tablet TAKE 1 TABLET (15 MG TOTAL) BY MOUTH DAILY AS NEEDED FOR PAIN. Patient not taking: Reported on 07/21/2020 01/07/18   [name],  MD  tiZANidine (ZANAFLEX) 2 MG tablet Take 1 tablet (2 mg total) by mouth every 6 (six) hours as needed for muscle spasms. Patient not taking: Reported on 07/21/2020 02/20/20   Gregor Hams, MD    Physical Exam: Constitutional: Moderately built and nourished. Vitals:   07/21/20 2115 07/21/20 2130 07/21/20 2145 07/21/20 2200  BP: 127/77 125/86 122/80 131/68  Pulse: 74 71 75 79  Resp: (!) 31 (!) 24 (!) 24 17  Temp:      TempSrc:      SpO2: 97% 98% 99% 98%   Eyes: Anicteric no pallor. ENMT: No discharge from the ears eyes nose or mouth. Neck: No mass felt.  No neck rigidity. Respiratory: No rhonchi or crepitations. Cardiovascular: S1-S2 heard. Abdomen: Soft nontender bowel sounds present. Musculoskeletal: Chronic skin changes in the lower extremity. Skin: Skin excoriations in the groin. Neurologic: Alert awake oriented to time place and person. Psychiatric: Appears normal.  Normal affect.   Labs on Admission: I have personally reviewed following labs and imaging studies  CBC: Recent Labs  Lab 07/21/20 1738  WBC 12.5*  NEUTROABS 10.6*  HGB 13.6  HCT 43.1  MCV 87.2  PLT 622   Basic Metabolic Panel: Recent Labs  Lab 07/21/20 1738  NA 140  K 5.1  CL 99  CO2 26  GLUCOSE 134*  BUN 27*  CREATININE 1.68*  CALCIUM 8.6*   GFR: CrCl cannot be calculated (Unknown ideal weight.). Liver Function Tests: Recent Labs  Lab 07/21/20 1738  AST 56*  ALT 30  ALKPHOS 65  BILITOT 1.9*   PROT 7.7  ALBUMIN 3.2*   No results for input(s): LIPASE, AMYLASE in the last 168 hours. No results for input(s): AMMONIA in the last 168 hours. Coagulation Profile: No results for input(s): INR, PROTIME in the last 168 hours. Cardiac Enzymes: No results for input(s): CKTOTAL, CKMB, CKMBINDEX, TROPONINI in the last 168 hours. BNP (last 3 results) No results for input(s): PROBNP in the last 8760 hours. HbA1C: No results for input(s): HGBA1C in the last 72 hours. CBG: No results for input(s): GLUCAP in the last 168 hours. Lipid Profile: No results for input(s): CHOL, HDL, LDLCALC, TRIG, CHOLHDL, LDLDIRECT in the last 72 hours. Thyroid Function Tests: No results for input(s): TSH, T4TOTAL, FREET4, T3FREE, THYROIDAB in the last 72 hours. Anemia Panel: No results for input(s): VITAMINB12, FOLATE, FERRITIN, TIBC, IRON, RETICCTPCT in the last 72 hours. Urine analysis:    Component Value Date/Time   COLORURINE AMBER (A) 07/21/2020 2006   APPEARANCEUR CLOUDY (A) 07/21/2020 2006   LABSPEC 1.018 07/21/2020 2006   PHURINE 5.0 07/21/2020 2006   GLUCOSEU NEGATIVE 07/21/2020 2006   [place] NEGATIVE 06/20/2017 1543   HGBUR NEGATIVE 07/21/2020 2006   BILIRUBINUR SMALL (A) 07/21/2020 2006   KETONESUR 5 (A) 07/21/2020 2006   PROTEINUR 30 (A) 07/21/2020 2006   UROBILINOGEN 1.0 06/20/2017 1543   NITRITE NEGATIVE 07/21/2020 2006   LEUKOCYTESUR NEGATIVE 07/21/2020 2006   Sepsis Labs: _0 (procalcitonin:4,lacticidven:4) )No results found for this or any previous visit (from the past 240 hour(s)).   Radiological Exams on Admission: DG Chest Port 1 View  Result Date: 07/21/2020 CLINICAL DATA:  Shortness of breath EXAM: PORTABLE CHEST 1 VIEW COMPARISON:  12/09/2015 FINDINGS: Cardiac shadow is enlarged but stable. Mild aortic calcifications are seen. The lungs are clear. Mild central vascular congestion is noted without edema. No bony abnormality is seen. IMPRESSION: Mild central vascular  congestion without significant edema. Electronically Signed   By: Inez Catalina  M.D.   On: 07/21/2020 18:12    EKG: Independently reviewed.  Normal sinus rhythm LVH.  Assessment/Plan Principal Problem:   AKI (acute kidney injury) ([place]) Active Problems:   Mild persistent asthma   GERD (gastroesophageal reflux disease)   Hypertrophic obstructive cardiomyopathy ([place])   Diabetes ([place])    1. Acute renal failure could be from dehydration.  We will gently hydrate and follow metabolic panel.  Hold Lasix for now. 2. Generalized weakness could be from dehydration.  Patient is receiving fluids.  Will also check CK and TSH levels.  Physical therapy consult. 3. History of hypertrophic cardiomyopathy and diastolic CHF presently receiving fluids due to acute renal failure and dehydration.  Last EF measured was in 2018 which showed EF of 65 to 70% with grade 1 diastolic dysfunction.  Closely monitor respiratory status since patient is receiving fluids. 4. Leukocytosis could be reactive.  No signs of infection.  Continue to monitor. 5. Diabetes mellitus type 2 we will keep patient on sliding scale coverage. 6. Hyperlipidemia on statins.  Check CK levels. 7. Multiple skin excoriations for which we have consulted wound team.   DVT prophylaxis: Lovenox. Code Status: Full code. Family Communication: Discussed with patient. Disposition Plan: Home. Consults called: Physical therapy. Admission status: Observation.   Rise Patience MD [facility] Pager [phone].  If 7PM-7AM, please contact night-coverage [email or web address] Password [facility]  07/21/2020, 10:49 PM

## 2020-07-22 ENCOUNTER — Inpatient Hospital Stay (HOSPITAL_COMMUNITY): Payer: Medicare Other

## 2020-07-22 DIAGNOSIS — M545 Low back pain, unspecified: Secondary | ICD-10-CM | POA: Diagnosis not present

## 2020-07-22 DIAGNOSIS — Z8541 Personal history of malignant neoplasm of cervix uteri: Secondary | ICD-10-CM | POA: Diagnosis not present

## 2020-07-22 DIAGNOSIS — E785 Hyperlipidemia, unspecified: Secondary | ICD-10-CM | POA: Diagnosis present

## 2020-07-22 DIAGNOSIS — R001 Bradycardia, unspecified: Secondary | ICD-10-CM | POA: Diagnosis not present

## 2020-07-22 DIAGNOSIS — Z882 Allergy status to sulfonamides status: Secondary | ICD-10-CM | POA: Diagnosis not present

## 2020-07-22 DIAGNOSIS — I5032 Chronic diastolic (congestive) heart failure: Secondary | ICD-10-CM | POA: Diagnosis present

## 2020-07-22 DIAGNOSIS — Z90710 Acquired absence of both cervix and uterus: Secondary | ICD-10-CM | POA: Diagnosis not present

## 2020-07-22 DIAGNOSIS — M1712 Unilateral primary osteoarthritis, left knee: Secondary | ICD-10-CM | POA: Diagnosis not present

## 2020-07-22 DIAGNOSIS — D72829 Elevated white blood cell count, unspecified: Secondary | ICD-10-CM | POA: Diagnosis present

## 2020-07-22 DIAGNOSIS — Z96651 Presence of right artificial knee joint: Secondary | ICD-10-CM | POA: Diagnosis present

## 2020-07-22 DIAGNOSIS — Z87442 Personal history of urinary calculi: Secondary | ICD-10-CM | POA: Diagnosis not present

## 2020-07-22 DIAGNOSIS — R5381 Other malaise: Secondary | ICD-10-CM | POA: Diagnosis not present

## 2020-07-22 DIAGNOSIS — I509 Heart failure, unspecified: Secondary | ICD-10-CM | POA: Diagnosis not present

## 2020-07-22 DIAGNOSIS — E1142 Type 2 diabetes mellitus with diabetic polyneuropathy: Secondary | ICD-10-CM | POA: Diagnosis present

## 2020-07-22 DIAGNOSIS — R609 Edema, unspecified: Secondary | ICD-10-CM | POA: Diagnosis not present

## 2020-07-22 DIAGNOSIS — Z833 Family history of diabetes mellitus: Secondary | ICD-10-CM | POA: Diagnosis not present

## 2020-07-22 DIAGNOSIS — J4530 Mild persistent asthma, uncomplicated: Secondary | ICD-10-CM | POA: Diagnosis present

## 2020-07-22 DIAGNOSIS — I110 Hypertensive heart disease with heart failure: Secondary | ICD-10-CM | POA: Diagnosis present

## 2020-07-22 DIAGNOSIS — I422 Other hypertrophic cardiomyopathy: Secondary | ICD-10-CM | POA: Diagnosis present

## 2020-07-22 DIAGNOSIS — Z20822 Contact with and (suspected) exposure to covid-19: Secondary | ICD-10-CM | POA: Diagnosis present

## 2020-07-22 DIAGNOSIS — R531 Weakness: Secondary | ICD-10-CM | POA: Diagnosis present

## 2020-07-22 DIAGNOSIS — J9601 Acute respiratory failure with hypoxia: Secondary | ICD-10-CM | POA: Diagnosis not present

## 2020-07-22 DIAGNOSIS — R279 Unspecified lack of coordination: Secondary | ICD-10-CM | POA: Diagnosis not present

## 2020-07-22 DIAGNOSIS — E860 Dehydration: Secondary | ICD-10-CM | POA: Diagnosis present

## 2020-07-22 DIAGNOSIS — K219 Gastro-esophageal reflux disease without esophagitis: Secondary | ICD-10-CM | POA: Diagnosis present

## 2020-07-22 DIAGNOSIS — I421 Obstructive hypertrophic cardiomyopathy: Secondary | ICD-10-CM | POA: Diagnosis present

## 2020-07-22 DIAGNOSIS — Z6841 Body Mass Index (BMI) 40.0 and over, adult: Secondary | ICD-10-CM | POA: Diagnosis not present

## 2020-07-22 DIAGNOSIS — Z888 Allergy status to other drugs, medicaments and biological substances status: Secondary | ICD-10-CM | POA: Diagnosis not present

## 2020-07-22 DIAGNOSIS — N179 Acute kidney failure, unspecified: Secondary | ICD-10-CM | POA: Diagnosis not present

## 2020-07-22 DIAGNOSIS — I248 Other forms of acute ischemic heart disease: Secondary | ICD-10-CM | POA: Diagnosis present

## 2020-07-22 DIAGNOSIS — Z743 Need for continuous supervision: Secondary | ICD-10-CM | POA: Diagnosis not present

## 2020-07-22 LAB — BASIC METABOLIC PANEL
Anion gap: 11 (ref 5–15)
BUN: 32 mg/dL — ABNORMAL HIGH (ref 8–23)
CO2: 28 mmol/L (ref 22–32)
Calcium: 8.1 mg/dL — ABNORMAL LOW (ref 8.9–10.3)
Chloride: 101 mmol/L (ref 98–111)
Creatinine, Ser: 1.36 mg/dL — ABNORMAL HIGH (ref 0.44–1.00)
GFR, Estimated: 41 mL/min — ABNORMAL LOW (ref >60)
Glucose, Bld: 120 mg/dL — ABNORMAL HIGH (ref 70–99)
Potassium: 3.1 mmol/L — ABNORMAL LOW (ref 3.5–5.1)
Sodium: 140 mmol/L (ref 135–145)

## 2020-07-22 LAB — GLUCOSE, CAPILLARY
Glucose-Capillary: 99 mg/dL (ref 70–99)
Glucose-Capillary: 86 mg/dL (ref 70–99)
Glucose-Capillary: 103 mg/dL — ABNORMAL HIGH (ref 70–99)
Glucose-Capillary: 88 mg/dL (ref 70–99)

## 2020-07-22 LAB — TSH: TSH: 0.754 u[IU]/mL (ref 0.350–4.500)

## 2020-07-22 LAB — CBC
HCT: 36 % (ref 36.0–46.0)
Hemoglobin: 11.4 g/dL — ABNORMAL LOW (ref 12.0–15.0)
MCH: 27.7 pg (ref 26.0–34.0)
MCHC: 31.7 g/dL (ref 30.0–36.0)
MCV: 87.6 fL (ref 80.0–100.0)
Platelets: 194 10*3/uL (ref 150–400)
RBC: 4.11 MIL/uL (ref 3.87–5.11)
RDW: 14.5 % (ref 11.5–15.5)
WBC: 10.7 10*3/uL — ABNORMAL HIGH (ref 4.0–10.5)
nRBC: 0 % (ref 0.0–0.2)

## 2020-07-22 LAB — CK: Total CK: 271 U/L — ABNORMAL HIGH (ref 38–234)

## 2020-07-22 MED ORDER — ACETAMINOPHEN 500 MG PO TABS
1000.0000 mg | ORAL_TABLET | Freq: Four times a day (QID) | ORAL | Status: DC | PRN
  Administered 2020-07-22 – 2020-07-27 (×7): 1000 mg via ORAL
  Filled 2020-07-22 (×7): qty 2

## 2020-07-22 MED ORDER — SODIUM CHLORIDE 0.9 % IV SOLN: INTRAVENOUS | Status: DC

## 2020-07-22 MED ORDER — ZINC OXIDE 12.8 % EX OINT
TOPICAL_OINTMENT | Freq: Three times a day (TID) | CUTANEOUS | Status: DC
  Filled 2020-07-22 (×2): qty 56.7

## 2020-07-22 MED ORDER — ORAL CARE MOUTH RINSE
15.0000 mL | Freq: Two times a day (BID) | OROMUCOSAL | Status: DC
  Administered 2020-07-22 – 2020-07-27 (×10): 15 mL via OROMUCOSAL

## 2020-07-22 MED ORDER — GABAPENTIN 100 MG PO CAPS
200.0000 mg | ORAL_CAPSULE | Freq: Two times a day (BID) | ORAL | Status: DC
  Administered 2020-07-22 – 2020-07-27 (×11): 200 mg via ORAL
  Filled 2020-07-22 (×11): qty 2

## 2020-07-22 MED ORDER — POTASSIUM CHLORIDE CRYS ER 20 MEQ PO TBCR
40.0000 meq | EXTENDED_RELEASE_TABLET | ORAL | Status: AC
  Administered 2020-07-22 (×2): 40 meq via ORAL
  Filled 2020-07-22 (×2): qty 2

## 2020-07-22 NOTE — Progress Notes (Addendum)
PROGRESS NOTE    Alyssa Ingram  TZG:[id number] DOB: 09-07-1945 DOA: 07/21/2020 PCP: [name], MD     Brief Narrative:  Alyssa Ingram is a 75 y.o. female with history of hypertrophic cardiomyopathy last EF measured in 2018 was 53 to 70% with grade 1 diastolic dysfunction with history of hypertension diabetes mellitus was brought to the ER after patient was found to be increasingly weak difficult to get up off the commode as per the patient's son who provided history to the ER physician.  Over the last 1 week patient has been falling increasingly difficult to ambulate because of weakness finding it difficult to get up from the commode.  Also has been having nonspecific pain all over.  Increase urination.  Denies any chest pain shortness of breath nausea vomiting or diarrhea.  Patient was admitted due to acute kidney injury, generalized weakness.  New events last 24 hours / Subjective: Patient without any acute complaints on examination, states that she feels a little bit better today compared to prior to admission.  No specific complaints to report.  Assessment & Plan:   Principal Problem:   AKI (acute kidney injury) ([place]) Active Problems:   Mild persistent asthma   GERD (gastroesophageal reflux disease)   Hypertrophic obstructive cardiomyopathy (HCC)   Diabetes (HCC)   AKI -Baseline creatinine 0.95 -Likely in setting of dehydration, prerenal etiology -Improving with IV fluid -Hold Lasix  Demand ischemia -Elevated troponin 62 --> 63  Leukocytosis -Without overt sign of infectious process, leukocytosis improving  History of hypertrophic cardiomyopathy, chronic diastolic heart failure -Admitted with dehydration, AKI -Monitor closely while on IV fluid -Continue Cardizem, Lopressor (looks like Dr. [name] Breed discontinued cardizem in 2018, unclear when it was restarted)  -Repeat echo  Diabetes mellitus type 2, well controlled -Sliding scale insulin  LLE  pain -Neuropathy? Increase Neurontin dose today  -Check DVT US   Hyperlipidemia -Continue Lipitor  Generalized weakness -PT OT recommending SNF placement  Hypokalemia -Replace, trend    DVT prophylaxis:  enoxaparin (LOVENOX) injection 40 mg Start: 07/21/20 2300  Code Status: Full code Family Communication: None at bedside, spoke with daughter over the phone, son did not answer call  Disposition Plan:  Status is: Inpatient  Remains inpatient appropriate because:Unsafe d/c plan and IV treatments appropriate due to intensity of illness or inability to take PO   Dispo: The patient is from: Home              Anticipated d/c is to: SNF              Patient currently is not medically stable to d/c.  Remains on IV fluid, continue to watch creatinine.  PT recommending SNF placement   Difficult to place patient No    Antimicrobials:  Anti-infectives (From admission, onward)   None        Objective: Vitals:   07/22/20 0500 07/22/20 0511 07/22/20 0920 07/22/20 0922  BP:  119/67 130/75   Pulse:  63 (!) 59 63  Resp:  18 20 20   Temp:  98 F (36.7 C) 98 F (36.7 C)   TempSrc:      SpO2:  93% 96% 92%  Weight: 86 kg       Intake/Output Summary (Last 24 hours) at 07/22/2020 1213 Last data filed at 07/22/2020 0321 Gross per 24 hour  Intake 504 ml  Output --  Net 504 ml   Filed Weights   07/22/20 0500  Weight: 86 kg    Examination:  General exam: Appears calm and comfortable  Respiratory system: Clear to auscultation. Respiratory effort normal. No respiratory distress. No conversational dyspnea.  Cardiovascular system: S1 & S2 heard, RRR. No murmurs. No pedal edema. Gastrointestinal system: Abdomen is nondistended, soft and nontender. Normal bowel sounds heard. Central nervous system: Alert and oriented. No focal neurological deficits. Speech clear.  Extremities: Symmetric in appearance, Legs TTP ?neuropathy  Skin: No rashes, lesions or ulcers on exposed skin   Psychiatry: Judgement and insight appear normal. Mood & affect appropriate.   Data Reviewed: I have personally reviewed following labs and imaging studies  CBC: Recent Labs  Lab 07/21/20 1738 07/22/20 0404  WBC 12.5* 10.7*  NEUTROABS 10.6*  --   HGB 13.6 11.4*  HCT 43.1 36.0  MCV 87.2 87.6  PLT 205 595   Basic Metabolic Panel: Recent Labs  Lab 07/21/20 1738 07/22/20 0404  NA 140 140  K 5.1 3.1*  CL 99 101  CO2 26 28  GLUCOSE 134* 120*  BUN 27* 32*  CREATININE 1.68* 1.36*  CALCIUM 8.6* 8.1*   GFR: Estimated Creatinine Clearance: 36.2 mL/min (A) (by C-G formula based on SCr of 1.36 mg/dL (H)). Liver Function Tests: Recent Labs  Lab 07/21/20 1738  AST 56*  ALT 30  ALKPHOS 65  BILITOT 1.9*  PROT 7.7  ALBUMIN 3.2*   No results for input(s): LIPASE, AMYLASE in the last 168 hours. No results for input(s): AMMONIA in the last 168 hours. Coagulation Profile: No results for input(s): INR, PROTIME in the last 168 hours. Cardiac Enzymes: Recent Labs  Lab 07/22/20 0626  CKTOTAL 271*   BNP (last 3 results) No results for input(s): PROBNP in the last 8760 hours. HbA1C: No results for input(s): HGBA1C in the last 72 hours. CBG: Recent Labs  Lab 07/22/20 0751 07/22/20 1157  GLUCAP 99 86   Lipid Profile: No results for input(s): CHOL, HDL, LDLCALC, TRIG, CHOLHDL, LDLDIRECT in the last 72 hours. Thyroid Function Tests: Recent Labs    07/22/20 0626  TSH 0.754   Anemia Panel: No results for input(s): VITAMINB12, FOLATE, FERRITIN, TIBC, IRON, RETICCTPCT in the last 72 hours. Sepsis Labs: No results for input(s): PROCALCITON, LATICACIDVEN in the last 168 hours.  Recent Results (from the past 240 hour(s))  Resp Panel by RT-PCR (Flu A&B, Covid) Nasopharyngeal Swab     Status: None   Collection Time: 07/21/20 10:14 PM   Specimen: Nasopharyngeal Swab; Nasopharyngeal(NP) swabs in vial transport medium  Result Value Ref Range Status   SARS Coronavirus 2 by RT  PCR NEGATIVE NEGATIVE Final    Comment: (NOTE) SARS-CoV-2 target nucleic acids are NOT DETECTED.  The SARS-CoV-2 RNA is generally detectable in upper respiratory specimens during the acute phase of infection. The lowest concentration of SARS-CoV-2 viral copies this assay can detect is 138 copies/mL. A negative result does not preclude SARS-Cov-2 infection and should not be used as the sole basis for treatment or other patient management decisions. A negative result may occur with  improper specimen collection/handling, submission of specimen other than nasopharyngeal swab, presence of viral mutation(s) within the areas targeted by this assay, and inadequate number of viral copies(<138 copies/mL). A negative result must be combined with clinical observations, patient history, and epidemiological information. The expected result is Negative.  Fact Sheet for Patients:  EntrepreneurPulse.com.au  Fact Sheet for Healthcare Providers:  IncredibleEmployment.be  This test is no t yet approved or cleared by the [place] FDA and  has been authorized for detection and/or diagnosis of  SARS-CoV-2 by FDA under an Emergency Use Authorization (EUA). This EUA will remain  in effect (meaning this test can be used) for the duration of the COVID-19 declaration under Section 564(b)(1) of the Act, 21 U.S.C.section 360bbb-3(b)(1), unless the authorization is terminated  or revoked sooner.       Influenza A by PCR NEGATIVE NEGATIVE Final   Influenza B by PCR NEGATIVE NEGATIVE Final    Comment: (NOTE) The Xpert Xpress SARS-CoV-2/FLU/RSV plus assay is intended as an aid in the diagnosis of influenza from Nasopharyngeal swab specimens and should not be used as a sole basis for treatment. Nasal washings and aspirates are unacceptable for Xpert Xpress SARS-CoV-2/FLU/RSV testing.  Fact Sheet for Patients: EntrepreneurPulse.com.au  Fact Sheet for  Healthcare Providers: IncredibleEmployment.be  This test is not yet approved or cleared by the [place] FDA and has been authorized for detection and/or diagnosis of SARS-CoV-2 by FDA under an Emergency Use Authorization (EUA). This EUA will remain in effect (meaning this test can be used) for the duration of the COVID-19 declaration under Section 564(b)(1) of the Act, 21 U.S.C. section 360bbb-3(b)(1), unless the authorization is terminated or revoked.  Performed at [facility], [place] [address] [place], [place] [address]       Radiology Studies: [facility] Chest Port 1 View  Result Date: 07/21/2020 CLINICAL DATA:  Shortness of breath EXAM: PORTABLE CHEST 1 VIEW COMPARISON:  12/09/2015 FINDINGS: Cardiac shadow is enlarged but stable. Mild aortic calcifications are seen. The lungs are clear. Mild central vascular congestion is noted without edema. No bony abnormality is seen. IMPRESSION: Mild central vascular congestion without significant edema. Electronically Signed   By: Inez Catalina M.D.   On: 07/21/2020 18:12      Scheduled Meds: . albuterol  2 puff Inhalation BID  . allopurinol  100 mg Oral Daily  . aspirin  162 mg Oral Daily  . atorvastatin  80 mg Oral Daily  . diltiazem  180 mg Oral Daily  . DULoxetine  30 mg Oral Daily  . enoxaparin (LOVENOX) injection  40 mg Subcutaneous Q24H  . gabapentin  300 mg Oral QHS  . insulin aspart  0-9 Units Subcutaneous TID WC  . mouth rinse  15 mL Mouth Rinse BID  . metoprolol tartrate  50 mg Oral BID  . pantoprazole  40 mg Oral Daily  . potassium chloride  40 mEq Oral Q4H  . Zinc Oxide   Topical TID   Continuous Infusions: . sodium chloride       LOS: 0 days      Time spent: 50 minutes including 15 min phone call with daughter   Dessa Phi, DO [facility] 07/22/2020, 12:13 PM   Available via Epic secure chat 7am-7pm After these hours, please refer to coverage provider listed on  [email or web address]

## 2020-07-22 NOTE — Evaluation (Signed)
Physical Therapy Evaluation Patient Details Name: SYRENITY KLEPACKI MRN: [id number] DOB: 1945-08-24 Today's Date: 07/22/2020   History of Present Illness  TUNISHA RULAND is a 75 y.o. female with history of hypertrophic cardiomyopathy last EF measured in 2018 was 55 to 70% with grade 1 diastolic dysfunction with history of hypertension diabetes mellitus was brought to the ER after patient was found to be increasingly weak difficult to get up off the commode as per the patient's son.Admitted for generalized weakness dehydration acute renal failure.  Clinical Impression  The patient is resting in bed, [place] out of nares, SPO2 88%. Replaced  [place], gradual return to 92%.  Patient  Very resistive with attempts  In bed mobility and sitting upright on bed edge. Patient pushing backwards strongly so returned to supine.  Patient not clear in her reports of Prior  Level of function. Per chart, recently difficulty ambulating, lives with son.  Pt admitted with above diagnosis.  Pt currently with functional limitations due to the deficits listed below (see PT Problem List). Pt will benefit from skilled PT to increase their independence and safety with mobility to allow discharge to the venue listed below.       Follow Up Recommendations SNF    Equipment Recommendations  None recommended by PT    Recommendations for Other Services       Precautions / Restrictions Precautions Precautions: Fall Precaution Comments: pain inlegs, skin changes      Mobility  Bed Mobility Overal bed mobility: Needs Assistance Bed Mobility: Rolling;Supine to Sit;Sit to Supine Rolling: +2 for physical assistance;+2 for safety/equipment;Total assist   Supine to sit: Total assist;+2 for physical assistance;+2 for safety/equipment Sit to supine: Total assist;+2 for safety/equipment;+2 for physical assistance   General bed mobility comments: attempted rolling to left, patient resistive. Bed pad and HOB raised , 2 total  assistance  to sit patiernt on bed edge but patient resisting so strongly, unable to sit upright and had to be assisted back to supine.    Transfers                 General transfer comment: unable  Ambulation/Gait                Stairs            Wheelchair Mobility    Modified Rankin (Stroke Patients Only)       Balance Overall balance assessment: History of Falls;Needs assistance Sitting-balance support: Feet unsupported;No upper extremity supported Sitting balance-Leahy Scale: Zero Sitting balance - Comments: patient resisting attempts to sit upright.                                     Pertinent Vitals/Pain Pain Assessment: Faces Faces Pain Scale: Hurts whole lot Pain Location: when moving legs Pain Descriptors / Indicators: Discomfort;Grimacing;Moaning;Guarding Pain Intervention(s): Limited activity within patient's tolerance;Monitored during session;Repositioned    Home Living Family/patient expects to be discharged to:: Private residence Living Arrangements: Children (son) Available Help at Discharge: Family;Available 24 hours/day Type of Home: Other(Comment) (condo) Home Access: Level entry     Home Layout: One level Home Equipment: Cane - single point;Walker - 2 wheels;Shower seat      Prior Function Level of Independence: Independent with assistive device(s)         Comments: uses walker, per chart histaory, patient brought in due to difficulty ambulating and getting off of toilet.  Hand Dominance   Dominant Hand: Right    Extremity/Trunk Assessment        Lower Extremity Assessment Lower Extremity Assessment: RLE deficits/detail;LLE deficits/detail RLE Deficits / Details: noted skin changes, required max assist to move the legs on bed . patient moans when moved. LLE Deficits / Details: similar to right, noted more skin changes vs. right    Cervical / Trunk Assessment Cervical / Trunk Assessment:  Other exceptions Cervical / Trunk Exceptions: patient leaning back, no trunk control due to resistive  Communication   Communication: No difficulties  Cognition Arousal/Alertness: Awake/alert Behavior During Therapy: WFL for tasks assessed/performed;Anxious (when attempted sitting.) Overall Cognitive Status: No family/caregiver present to determine baseline cognitive functioning Area of Impairment: Orientation;Memory                 Orientation Level: Time;Situation Current Attention Level: Sustained           General Comments: patientreports has been ambulating, unsure of relaiability.      General Comments      Exercises     Assessment/Plan    PT Assessment Patient needs continued PT services  PT Problem List Decreased strength;Decreased mobility;Decreased safety awareness;Decreased activity tolerance;Decreased cognition;Decreased coordination;Decreased knowledge of precautions;Decreased range of motion;Decreased balance;Decreased knowledge of use of DME;Pain;Decreased skin integrity;Obesity       PT Treatment Interventions DME instruction;Therapeutic activities;Cognitive remediation;Gait training;Therapeutic exercise;Patient/family education;Functional mobility training;Balance training    PT Goals (Current goals can be found in the Care Plan section)  Acute Rehab PT Goals Patient Stated Goal: agreed to sitting up PT Goal Formulation: Patient unable to participate in goal setting Time For Goal Achievement: 08/05/20 Potential to Achieve Goals: Fair    Frequency Min 2X/week   Barriers to discharge Decreased caregiver support      Co-evaluation PT/OT/SLP Co-Evaluation/Treatment: Yes Reason for Co-Treatment: For patient/therapist safety PT goals addressed during session: Mobility/safety with mobility OT goals addressed during session: ADL's and self-care       AM-PAC PT "6 Clicks" Mobility  Outcome Measure Help needed turning from your back to your side  while in a flat bed without using bedrails?: Total Help needed moving from lying on your back to sitting on the side of a flat bed without using bedrails?: Total Help needed moving to and from a bed to a chair (including a wheelchair)?: Total Help needed standing up from a chair using your arms (e.g., wheelchair or bedside chair)?: Total Help needed to walk in hospital room?: Total Help needed climbing 3-5 steps with a railing? : Total 6 Click Score: 6    End of Session Equipment Utilized During Treatment: Oxygen Activity Tolerance:  (anxious  and resisting mobility) Patient left: in bed;with call bell/phone within reach;with bed alarm set Nurse Communication: Mobility status;Need for lift equipment PT Visit Diagnosis: Unsteadiness on feet (R26.81);History of falling (Z91.81)    Time: 0254-2706 PT Time Calculation (min) (ACUTE ONLY): 23 min   Charges:   PT Evaluation $PT Eval Moderate Complexity: [place] Pager [phone] Office [phone]   Claretha Cooper 07/22/2020, 9:41 AM

## 2020-07-22 NOTE — Consult Note (Signed)
WOC Nurse Consult Note: Reason for Consult: Skin excoriations (scratches and linear openings in skin), partial thickness. Wound type: Trauma, moisture (MASD) ICD-10 CM Codes for Irritant Dermatitis  L24A2 - Due to fecal, urinary or dual incontinence L30.4  - Erythema intertrigo; genital/thigh intertrigo.  Pressure Injury POA: N/A Measurement: N/A Wound OZD:GUYQ, moist Drainage (amount, consistency, odor) scant serous Periwound: intact, macerated Dressing procedure/placement/frequency: Patient reports period of progressive weakness and elongated periods between being able to clean up after incontinence. Patient's Bedside RN last night was Wound Treatment Associate Lissa Morales) Charlsie Merles and her Bedside RN today is [facility]TA H. Yount.  Both identify the lesions as moisture associated skin damage and intertriginous dermatitis.  As patient has a known allergy to prednisone, we will not initiate Gerhart's Butt Cream but instead will implement a treatment POC using cleansing followed by Triple Past, a zinc oxide topical moisture barrier.   A mattress replacement with low air loss feature as well as bilateral pressure redistribution heel boots and a pressure redistribution chair pad for when the patient is OOB to chair are provided.  Turning and repositioning are already in place per house protocols and will continue.  [facility] nursing team will not follow, but will remain available to this patient, the nursing and medical teams.  Please re-consult if needed. Thank you, Maudie Flakes, MSN, RN, Chancy Milroy, Arther Abbott  Pager# [phone]

## 2020-07-22 NOTE — Progress Notes (Signed)
Bilateral lower extremity venous duplex has been completed. Preliminary results can be found in CV Proc through chart review.   07/22/20 4:16 PM Carlos Levering RVT

## 2020-07-22 NOTE — Progress Notes (Signed)
Occupational Therapy Evaluation  Patient lives at home with son in a single level [place], patient states son is retired therefore home 24/7. Patient providing different history to nursing staff and therapy regarding her PLOF/mobility therefore unsure exact level of assist she may have needed prior to hospitalization. Per chart review son reporting multiple falls and difficulty getting off of commode, when asked patient about this states "I think people get excited and exaggerate things." However nursing informed OT that patient stated previous night she hadn't walked in many months. Patient currently with significant weakness, increased pain especially in lower extremities with mobility and anxiety with attempted mobility. Patient unable to assist with bed mobility, therefore used bed pad to transition to sitting. Patient starts grabbing out for PT/bed rails and significant posterior lean against OT. Unable to reassure patient she won't fall therefore transitioned back to supine. Recommend continued acute OT services to maximize patient strength, balance, activity tolerance, safety awareness in order to facilitate D/C to venue listed below.    07/22/20 1200  OT Visit Information  Last OT Received On 07/22/20  Assistance Needed +2  PT/OT/SLP Co-Evaluation/Treatment Yes  Reason for Co-Treatment For patient/therapist safety;To address functional/ADL transfers  OT goals addressed during session ADL's and self-care  History of Present Illness Alyssa Ingram is a 75 y.o. female with history of hypertrophic cardiomyopathy last EF measured in 2018 was 65 to 70% with grade 1 diastolic dysfunction with history of hypertension diabetes mellitus was brought to the ER after patient was found to be increasingly weak difficult to get up off the commode as per the patient's son.Admitted for generalized weakness dehydration acute renal failure.  Precautions  Precautions Fall  Precaution Comments painful LEs/sensitive  skin  Restrictions  Weight Bearing Restrictions No  Home Living  Family/patient expects to be discharged to: Private residence  Living Arrangements Children (son)  Available Help at Discharge Family;Available 24 hours/day  Type of Home Other(Comment) (condo)  Home Access Level entry  Home Layout One level  Bathroom Shower/Tub Tub/shower unit  Research officer, trade [employer] - single point;Walker - 2 wheels;Shower seat  Prior Function  Level of Independence Independent with assistive device(s)  Comments uses walker, per chart histaory, patient brought in due to difficulty ambulating and getting off of toilet.  Communication  Communication No difficulties  Pain Assessment  Pain Assessment Faces  Faces Pain Scale 8  Pain Location when moving legs  Pain Descriptors / Indicators Discomfort;Grimacing;Moaning;Guarding  Pain Intervention(s) Monitored during session  Cognition  Arousal/Alertness Awake/alert  Behavior During Therapy WFL for tasks assessed/performed;Anxious (with attempting sitting EOB)  Overall Cognitive Status No family/caregiver present to determine baseline cognitive functioning  Area of Impairment Orientation;Memory;Safety/judgement  Orientation Level Disoriented to;Time;Situation  Memory Decreased short-term memory  Safety/Judgement Decreased awareness of safety;Decreased awareness of deficits  General Comments patient unsure of month "April or March" patient also providing different history to staff. Informed night RN she hadn't walked "since April" when asked "so a year ago?" pt states not that long. When OT asked pt when she last walked stated about a week. Patient also denying fall in last month, had fall "few months ago" and that people are exaggerating her falling at home  Upper Extremity Assessment  Upper Extremity Assessment Generalized weakness  Lower Extremity Assessment  Lower Extremity Assessment Defer to PT evaluation  Cervical / Trunk  Assessment  Cervical / Trunk Assessment Other exceptions  Cervical / Trunk Exceptions patient leaning back, no trunk control due to resistive  ADL  Overall ADL's  Needs assistance/impaired  Eating/Feeding Set up;Bed level  Eating/Feeding Details (indicate cue type and reason) pt able to drink from cup with straw  Grooming Set up;Bed level  Upper Body Bathing Moderate assistance;Bed level  Lower Body Bathing Total assistance;Bed level  Upper Body Dressing  Maximal assistance;Bed level  Lower Body Dressing Total assistance;Bed level  Toilet Transfer Details (indicate cue type and reason) unable. patient providing little effort during bed mobility, utilized pad to transition to sitting however patient yelling, grabbing out for PT and significant posterior lean making it dangerous to sit EOB as patient likely to slide foward therefore transitioned back to supine  Toileting- Clothing Manipulation and Hygiene Total assistance;Bed level  Functional mobility during ADLs Maximal assistance;+2 for physical assistance  General ADL Comments patient significantly limited in ADLs due to weakness, pain and anxiety with bed mobility.  Bed Mobility  Overal bed mobility Needs Assistance  Bed Mobility Rolling;Supine to Sit;Sit to Supine  Rolling +2 for physical assistance;+2 for safety/equipment;Total assist  Supine to sit Total assist;+2 for physical assistance;+2 for safety/equipment  Sit to supine Total assist;+2 for safety/equipment;+2 for physical assistance  General bed mobility comments attempted rolling to left, patient resistive. Bed pad and HOB raised , 2 total assistance  to sit patiernt on bed edge but patient resisting so strongly, unable to sit upright and had to be assisted back to supine.  Transfers  General transfer comment unable, see toilet transfer in ADL section for details  Balance  Overall balance assessment History of Falls;Needs assistance  Sitting-balance support Feet unsupported;No  upper extremity supported  Sitting balance-Leahy Scale Zero  Sitting balance - Comments patient resisting attempts to sit upright.  OT - End of Session  Activity Tolerance Patient limited by pain;Other (comment) (Treatment limited secondary to patient anxiety)  Patient left in bed;with call bell/phone within reach;with bed alarm set  Nurse Communication Mobility status  OT Assessment  OT Recommendation/Assessment Patient needs continued OT Services  OT Visit Diagnosis Other abnormalities of gait and mobility (R26.89);Muscle weakness (generalized) (M62.81);History of falling (Z91.81);Other symptoms and signs involving cognitive function;Pain  Pain - part of body Leg (bilateral)  OT Problem List Decreased strength;Decreased activity tolerance;Impaired balance (sitting and/or standing);Decreased cognition;Decreased safety awareness;Obesity;Pain  OT Plan  OT Frequency (ACUTE ONLY) Min 2X/week  OT Treatment/Interventions (ACUTE ONLY) Self-care/ADL training;Therapeutic exercise;DME and/or AE instruction;Therapeutic activities;Cognitive remediation/compensation;Patient/family education;Balance training  AM-PAC OT "6 Clicks" Daily Activity Outcome Measure (Version 2)  Help from another person eating meals? 3  Help from another person taking care of personal grooming? 3  Help from another person toileting, which includes using toliet, bedpan, or urinal? 1  Help from another person bathing (including washing, rinsing, drying)? 2  Help from another person to put on and taking off regular upper body clothing? 2  Help from another person to put on and taking off regular lower body clothing? 1  6 Click Score 12  OT Recommendation  Follow Up Recommendations SNF  OT Equipment None recommended by OT  Individuals Consulted  Consulted and Agree with Results and Recommendations Patient  Acute Rehab OT Goals  Patient Stated Goal eat breakfast  OT Goal Formulation With patient  Time For Goal Achievement  08/05/20  Potential to Achieve Goals Good  OT Time Calculation  OT Start Time (ACUTE ONLY) 0748  OT Stop Time (ACUTE ONLY) 1950  OT Time Calculation (min) 24 min  OT General Charges  $OT Visit 1 Visit  OT Evaluation  $OT Eval  Low Complexity 1 Low  Written Expression  Dominant Hand Right   Delbert Phenix OT OT pager: [phone]

## 2020-07-23 ENCOUNTER — Other Ambulatory Visit (HOSPITAL_COMMUNITY): Payer: Medicare Other

## 2020-07-23 DIAGNOSIS — N179 Acute kidney failure, unspecified: Secondary | ICD-10-CM | POA: Diagnosis not present

## 2020-07-23 LAB — BASIC METABOLIC PANEL
Anion gap: 11 (ref 5–15)
BUN: 24 mg/dL — ABNORMAL HIGH (ref 8–23)
CO2: 26 mmol/L (ref 22–32)
Calcium: 8.2 mg/dL — ABNORMAL LOW (ref 8.9–10.3)
Chloride: 105 mmol/L (ref 98–111)
Creatinine, Ser: 0.91 mg/dL (ref 0.44–1.00)
GFR, Estimated: >60 mL/min (ref >60)
Glucose, Bld: 82 mg/dL (ref 70–99)
Potassium: 3.4 mmol/L — ABNORMAL LOW (ref 3.5–5.1)
Sodium: 142 mmol/L (ref 135–145)

## 2020-07-23 LAB — GLUCOSE, CAPILLARY
Glucose-Capillary: 73 mg/dL (ref 70–99)
Glucose-Capillary: 96 mg/dL (ref 70–99)
Glucose-Capillary: 96 mg/dL (ref 70–99)
Glucose-Capillary: 95 mg/dL (ref 70–99)

## 2020-07-23 LAB — MAGNESIUM: Magnesium: 2 mg/dL (ref 1.7–2.4)

## 2020-07-23 LAB — CBC
HCT: 34.4 % — ABNORMAL LOW (ref 36.0–46.0)
Hemoglobin: 10.8 g/dL — ABNORMAL LOW (ref 12.0–15.0)
MCH: 27.5 pg (ref 26.0–34.0)
MCHC: 31.4 g/dL (ref 30.0–36.0)
MCV: 87.5 fL (ref 80.0–100.0)
Platelets: 189 10*3/uL (ref 150–400)
RBC: 3.93 MIL/uL (ref 3.87–5.11)
RDW: 14.6 % (ref 11.5–15.5)
WBC: 9.7 10*3/uL (ref 4.0–10.5)
nRBC: 0 % (ref 0.0–0.2)

## 2020-07-23 MED ORDER — POTASSIUM CHLORIDE CRYS ER 20 MEQ PO TBCR
40.0000 meq | EXTENDED_RELEASE_TABLET | Freq: Once | ORAL | Status: AC
  Administered 2020-07-23: 40 meq via ORAL
  Filled 2020-07-23: qty 2

## 2020-07-23 NOTE — TOC Initial Note (Addendum)
Transition of Care [facility]) - Initial/Assessment Note    Patient Details  Name: Alyssa Ingram MRN: [id number] Date of Birth: 08/04/1945  Transition of Care [facility]) CM/SW Contact:    Ross Ludwig, LCSW Phone Number: 07/23/2020, 5:48 PM  Clinical Narrative:                  Patient is a 75 year old female who is alert and oriented x4.  Patient lives with her son, and plans to go to SNF for short term rehab then return back home.  Patient has been vaccinated and also has received booster.  Patient has been to SNF before in [place] when they used to live there.  Patient had her son [name] [phone] at bedside.  CSW discussed how insurance will pay for stay and what to expect at SNF.  CSW explained the process of trying to get patient discharged to SNF.  Patient is agreeable to SNF and gave CSW permission to begin bed search in [facility].  Patient has been faxed out awaiting bed offers.  4:45pm  Patient's son requested bed offers to be emailed to him at [email or web address].  CSW emailed via secure email bed offers for patient.  Per son he and his sister will review options and let CSW know.  Expected Discharge Plan: Skilled Nursing Facility Barriers to Discharge: Continued Medical Work up   Patient Goals and CMS Choice Patient states their goals for this hospitalization and ongoing recovery are:: To go to SNF for rehab, then return back home. CMS Medicare.gov Compare Post Acute Care list provided to:: Patient Choice offered to / list presented to : Adult Children,Patient  Expected Discharge Plan and Services Expected Discharge Plan: [place] Choice: [place] arrangements for the past 2 months: Single Family Home                                      Prior Living Arrangements/Services Living arrangements for the past 2 months: Single Family Home Lives with:: Adult Children Patient language and need for  interpreter reviewed:: Yes Do you feel safe going back to the place where you live?: No   Patient and family feel she needs short term rehab, before returning back home.  Need for Family Participation in Patient Care: No (Comment) Care giver support system in place?: No (comment)   Criminal Activity/Legal Involvement Pertinent to Current Situation/Hospitalization: No - Comment as needed  Activities of Daily Living Home Assistive Devices/Equipment: Walker (specify type) Armed forces training and education officer) ADL Screening (condition at time of admission) Patient's cognitive ability adequate to safely complete daily activities?: Yes Is the patient deaf or have difficulty hearing?: No Does the patient have difficulty seeing, even when wearing glasses/contacts?: No Does the patient have difficulty concentrating, remembering, or making decisions?: Yes Patient able to express need for assistance with ADLs?: Yes Does the patient have difficulty dressing or bathing?: Yes Independently performs ADLs?: No Communication: Independent Dressing (OT): Needs assistance Is this a change from baseline?: Pre-admission baseline Grooming: Needs assistance Is this a change from baseline?: Pre-admission baseline Feeding: Independent Bathing: Needs assistance Is this a change from baseline?: Pre-admission baseline Toileting: Needs assistance Is this a change from baseline?: Pre-admission baseline In/Out Bed: Needs assistance Is this a change from baseline?: Pre-admission baseline Walks in Home: Needs assistance Is this a change from baseline?: Pre-admission baseline Does the patient  have difficulty walking or climbing stairs?: Yes Weakness of Legs: Both Weakness of Arms/Hands: Both  Permission Sought/Granted Permission sought to share information with : Facility Contact Representative,Family Supports Permission granted to share information with : Yes, Verbal Permission Granted  Share Information with NAME: Eustace Quail    [phone]  Alexander,Janine Daughter   [phone]  Permission granted to share info w AGENCY: SNF admissions        Emotional Assessment Appearance:: Appears stated age   Affect (typically observed): Accepting,Appropriate,Calm,Pleasant,Stable Orientation: : Oriented to Self,Oriented to Place,Oriented to  Time,Oriented to Situation Alcohol / Substance Use: Not Applicable Psych Involvement: No (comment)  Admission diagnosis:  Generalized weakness [R53.1] AKI (acute kidney injury) ([place]) [N17.9] Patient Active Problem List   Diagnosis Date Noted  . AKI (acute kidney injury) ([place]) 07/21/2020  . Right elbow pain 05/06/2020  . Throat pain 11/04/2019  . Neuropathy 11/04/2019  . Insomnia 03/09/2019  . Daytime somnolence 07/17/2018  . Paresthesia of bilateral legs 07/17/2018  . Acute pharyngitis 07/17/2018  . Greater trochanteric bursitis of left hip 01/30/2018  . Low back pain 06/21/2017  . Lateral epicondylitis of right elbow 06/20/2017  . HLD (hyperlipidemia) 08/24/2016  . Right shoulder pain 08/24/2016  . Debility 08/10/2016  . Viral illness 06/13/2016  . Headache 12/09/2015  . Degenerative arthritis of left knee 11/23/2015  . Acute renal failure syndrome ([place]) 02/04/2015  . Bilateral foot pain 01/26/2015  . History of knee replacement procedure of right knee 08/06/2014  . Arthritis of left lower extremity 08/06/2014  . Cellulitis of leg, left 04/14/2014  . Acute bronchitis 04/14/2014  . Chronic venous insufficiency 04/14/2014  . Left leg pain 03/12/2014  . Patellar tendonitis of left knee 07/31/2013  . Superficial phlebitis 01/31/2013  . Chronic pain of right knee 01/31/2013  . Diabetes ([place]) 01/31/2013  . Allergic rhinitis 01/31/2013  . Hypertrophic obstructive cardiomyopathy ([place]) 05/03/2012  . Left lumbar radiculopathy 11/22/2011  . Right knee pain 09/13/2011  . Cough 09/13/2011  . Arthritis   . Mild persistent asthma   . Cervical cancer ([place])   . GERD  (gastroesophageal reflux disease)   . Hypertension   . Kidney stones   . CHF (congestive heart failure) ([place])   . Preventative health care 09/10/2011   PCP:  [name], MD Pharmacy:   [id number] - [place], [place] [address] Phone: [phone] Fax: [phone]  [place] [facility] [place], [place] [address] Phone: [phone] Fax: [phone]  [place], [place] [address] [place] [address] Phone: [phone] Fax: [phone]     Social Determinants of Health (SDOH) Interventions    Readmission Risk Interventions No flowsheet data found.

## 2020-07-23 NOTE — Progress Notes (Signed)
PROGRESS NOTE    Alyssa Ingram  QIH:[id number] DOB: Dec 15, 1945 DOA: 07/21/2020 PCP: [name], MD     Brief Narrative:  Alyssa Ingram is a 75 y.o. female with history of hypertrophic cardiomyopathy last EF measured in 2018 was 66 to 70% with grade 1 diastolic dysfunction with history of hypertension diabetes mellitus was brought to the ER after patient was found to be increasingly weak difficult to get up off the commode as per the patient's son who provided history to the ER physician.  Over the last 1 week patient has been falling increasingly difficult to ambulate because of weakness finding it difficult to get up from the commode.  Also has been having nonspecific pain all over.  Increase urination.  Denies any chest pain shortness of breath nausea vomiting or diarrhea.  Patient was admitted due to acute kidney injury, generalized weakness.  New events last 24 hours / Subjective: Patient states that she is feeling much better since admission.  Continues to have lower extremity throbbing pain which has been ongoing, left worse than right.  She did miss couple doses of Neurontin recently while she has been feeling ill. She is agreeable to short-term rehab.   Assessment & Plan:   Principal Problem:   AKI (acute kidney injury) ([place]) Active Problems:   Mild persistent asthma   GERD (gastroesophageal reflux disease)   Hypertrophic obstructive cardiomyopathy (HCC)   Diabetes (HCC)   AKI -Baseline creatinine 0.95 -Likely in setting of dehydration, prerenal etiology, lasix use  -Resolved with IV fluid  Demand ischemia -Elevated troponin 62 --> 63  Leukocytosis -Without overt sign of infectious process, leukocytosis resolved   History of hypertrophic cardiomyopathy, chronic diastolic heart failure -Admitted with dehydration, AKI -Continue Cardizem, Lopressor (looks like Dr. [name] Breed discontinued cardizem in 2018, unclear when it was restarted)  -Repeat echo pending    Diabetes mellitus type 2, well controlled -Sliding scale insulin  LLE pain -Neuropathy? Increased Neurontin dose  -DVT US negative -Improved   Hyperlipidemia -Continue Lipitor  Generalized weakness -PT OT recommending SNF placement  Hypokalemia -Replace, trend    DVT prophylaxis:  enoxaparin (LOVENOX) injection 40 mg Start: 07/21/20 2300  Code Status: Full code Family Communication: None at bedside, spoke with daughter over the phone, son did not answer call today and voicemail box not set up  Disposition Plan:  Status is: Inpatient  Remains inpatient appropriate because:Unsafe d/c plan   Dispo: The patient is from: Home              Anticipated d/c is to: SNF              Patient currently is not medically stable to d/c.  Echo pending.  PT recommending SNF placement   Difficult to place patient No    Antimicrobials:  Anti-infectives (From admission, onward)   None       Objective: Vitals:   07/22/20 2103 07/22/20 2326 07/23/20 0449 07/23/20 0811  BP: 136/72  116/70   Pulse: (!) 57  (!) 52   Resp: 20  20   Temp: 98.2 F (36.8 C)  98 F (36.7 C)   TempSrc: Oral  Oral   SpO2: 91% 94% 97% 98%  Weight:        Intake/Output Summary (Last 24 hours) at 07/23/2020 1231 Last data filed at 07/23/2020 0700 Gross per 24 hour  Intake 1692.35 ml  Output 500 ml  Net 1192.35 ml   Filed Weights   07/22/20 0500  Weight:  86 kg    Examination: General exam: Appears calm and comfortable  Respiratory system: Clear to auscultation. Respiratory effort normal. Cardiovascular system: S1 & S2 heard, RRR. No pedal edema. Gastrointestinal system: Abdomen is nondistended, soft and nontender. Normal bowel sounds heard. Central nervous system: Alert and oriented. Non focal exam. Speech clear  Extremities: Symmetric in appearance bilaterally  Skin: No rashes, lesions or ulcers on exposed skin , + chronic venous stasis changes of bilateral lower extremities Psychiatry:  Judgement and insight appear stable. Mood & affect appropriate.    Data Reviewed: I have personally reviewed following labs and imaging studies  CBC: Recent Labs  Lab 07/21/20 1738 07/22/20 0404 07/23/20 0405  WBC 12.5* 10.7* 9.7  NEUTROABS 10.6*  --   --   HGB 13.6 11.4* 10.8*  HCT 43.1 36.0 34.4*  MCV 87.2 87.6 87.5  PLT 205 194 578   Basic Metabolic Panel: Recent Labs  Lab 07/21/20 1738 07/22/20 0404 07/23/20 0405  NA 140 140 142  K 5.1 3.1* 3.4*  CL 99 101 105  CO2 26 28 26   GLUCOSE 134* 120* 82  BUN 27* 32* 24*  CREATININE 1.68* 1.36* 0.91  CALCIUM 8.6* 8.1* 8.2*  MG  --   --  2.0   GFR: Estimated Creatinine Clearance: 54 mL/min (by C-G formula based on SCr of 0.91 mg/dL). Liver Function Tests: Recent Labs  Lab 07/21/20 1738  AST 56*  ALT 30  ALKPHOS 65  BILITOT 1.9*  PROT 7.7  ALBUMIN 3.2*   No results for input(s): LIPASE, AMYLASE in the last 168 hours. No results for input(s): AMMONIA in the last 168 hours. Coagulation Profile: No results for input(s): INR, PROTIME in the last 168 hours. Cardiac Enzymes: Recent Labs  Lab 07/22/20 0626  CKTOTAL 271*   BNP (last 3 results) No results for input(s): PROBNP in the last 8760 hours. HbA1C: No results for input(s): HGBA1C in the last 72 hours. CBG: Recent Labs  Lab 07/22/20 1157 07/22/20 1702 07/22/20 2104 07/23/20 0816 07/23/20 1142  GLUCAP 86 103* 88 73 96   Lipid Profile: No results for input(s): CHOL, HDL, LDLCALC, TRIG, CHOLHDL, LDLDIRECT in the last 72 hours. Thyroid Function Tests: Recent Labs    07/22/20 0626  TSH 0.754   Anemia Panel: No results for input(s): VITAMINB12, FOLATE, FERRITIN, TIBC, IRON, RETICCTPCT in the last 72 hours. Sepsis Labs: No results for input(s): PROCALCITON, LATICACIDVEN in the last 168 hours.  Recent Results (from the past 240 hour(s))  Resp Panel by RT-PCR (Flu A&B, Covid) Nasopharyngeal Swab     Status: None   Collection Time: 07/21/20 10:14 PM    Specimen: Nasopharyngeal Swab; Nasopharyngeal(NP) swabs in vial transport medium  Result Value Ref Range Status   SARS Coronavirus 2 by RT PCR NEGATIVE NEGATIVE Final    Comment: (NOTE) SARS-CoV-2 target nucleic acids are NOT DETECTED.  The SARS-CoV-2 RNA is generally detectable in upper respiratory specimens during the acute phase of infection. The lowest concentration of SARS-CoV-2 viral copies this assay can detect is 138 copies/mL. A negative result does not preclude SARS-Cov-2 infection and should not be used as the sole basis for treatment or other patient management decisions. A negative result may occur with  improper specimen collection/handling, submission of specimen other than nasopharyngeal swab, presence of viral mutation(s) within the areas targeted by this assay, and inadequate number of viral copies(<138 copies/mL). A negative result must be combined with clinical observations, patient history, and epidemiological information. The expected result is Negative.  Fact Sheet for Patients:  EntrepreneurPulse.com.au  Fact Sheet for Healthcare Providers:  IncredibleEmployment.be  This test is no t yet approved or cleared by the [place] FDA and  has been authorized for detection and/or diagnosis of SARS-CoV-2 by FDA under an Emergency Use Authorization (EUA). This EUA will remain  in effect (meaning this test can be used) for the duration of the COVID-19 declaration under Section 564(b)(1) of the Act, 21 U.S.C.section 360bbb-3(b)(1), unless the authorization is terminated  or revoked sooner.       Influenza A by PCR NEGATIVE NEGATIVE Final   Influenza B by PCR NEGATIVE NEGATIVE Final    Comment: (NOTE) The Xpert Xpress SARS-CoV-2/FLU/RSV plus assay is intended as an aid in the diagnosis of influenza from Nasopharyngeal swab specimens and should not be used as a sole basis for treatment. Nasal washings and aspirates are  unacceptable for Xpert Xpress SARS-CoV-2/FLU/RSV testing.  Fact Sheet for Patients: EntrepreneurPulse.com.au  Fact Sheet for Healthcare Providers: IncredibleEmployment.be  This test is not yet approved or cleared by the [place] FDA and has been authorized for detection and/or diagnosis of SARS-CoV-2 by FDA under an Emergency Use Authorization (EUA). This EUA will remain in effect (meaning this test can be used) for the duration of the COVID-19 declaration under Section 564(b)(1) of the Act, 21 U.S.C. section 360bbb-3(b)(1), unless the authorization is terminated or revoked.  Performed at [facility], [place] [address] [place], [place] [address]       Radiology Studies: [facility] Chest Port 1 View  Result Date: 07/21/2020 CLINICAL DATA:  Shortness of breath EXAM: PORTABLE CHEST 1 VIEW COMPARISON:  12/09/2015 FINDINGS: Cardiac shadow is enlarged but stable. Mild aortic calcifications are seen. The lungs are clear. Mild central vascular congestion is noted without edema. No bony abnormality is seen. IMPRESSION: Mild central vascular congestion without significant edema. Electronically Signed   By: Inez Catalina M.D.   On: 07/21/2020 18:12   VAS [place] LOWER EXTREMITY VENOUS (DVT)  Result Date: 07/22/2020  Lower Venous DVT Study Indications: Edema.  Risk Factors: None identified. Limitations: Body habitus, poor ultrasound/tissue interface and patient positioning, patient pain tolerance, calf induration. Comparison Study: No prior studies. Performing Technologist: Oliver Hum RVT  Examination Guidelines: A complete evaluation includes B-mode imaging, spectral Doppler, color Doppler, and power Doppler as needed of all accessible portions of each vessel. Bilateral testing is considered an integral part of a complete examination. Limited examinations for reoccurring indications may be performed as noted. The reflux portion of the exam is  performed with the patient in reverse Trendelenburg.  +---------+---------------+---------+-----------+----------+-------------------+ RIGHT    CompressibilityPhasicitySpontaneityPropertiesThrombus Aging      +---------+---------------+---------+-----------+----------+-------------------+ CFV      Full           Yes      Yes                                      +---------+---------------+---------+-----------+----------+-------------------+ SFJ      Full                                                             +---------+---------------+---------+-----------+----------+-------------------+ FV Prox  Full                                                             +---------+---------------+---------+-----------+----------+-------------------+  FV Mid   Full                                                             +---------+---------------+---------+-----------+----------+-------------------+ FV DistalFull                                                             +---------+---------------+---------+-----------+----------+-------------------+ PFV      Full                                                             +---------+---------------+---------+-----------+----------+-------------------+ POP      Full           Yes      Yes                                      +---------+---------------+---------+-----------+----------+-------------------+ PTV      Full                                                             +---------+---------------+---------+-----------+----------+-------------------+ PERO                                                  Not well visualized +---------+---------------+---------+-----------+----------+-------------------+   +---------+---------------+---------+-----------+----------+-------------------+ LEFT     CompressibilityPhasicitySpontaneityPropertiesThrombus Aging       +---------+---------------+---------+-----------+----------+-------------------+ CFV      Full           Yes      Yes                                      +---------+---------------+---------+-----------+----------+-------------------+ SFJ      Full                                                             +---------+---------------+---------+-----------+----------+-------------------+ FV Prox  Full                                                             +---------+---------------+---------+-----------+----------+-------------------+ FV Mid   Full                                                             +---------+---------------+---------+-----------+----------+-------------------+  FV Distal               Yes      Yes                                      +---------+---------------+---------+-----------+----------+-------------------+ PFV      Full                                                             +---------+---------------+---------+-----------+----------+-------------------+ POP      Full           Yes      Yes                                      +---------+---------------+---------+-----------+----------+-------------------+ PTV                                                   Not well visualized +---------+---------------+---------+-----------+----------+-------------------+ PERO                                                  Not well visualized +---------+---------------+---------+-----------+----------+-------------------+    Summary: RIGHT: - There is no evidence of deep vein thrombosis in the lower extremity. However, portions of this examination were limited- see technologist comments above.  - No cystic structure found in the popliteal fossa.  LEFT: - There is no evidence of deep vein thrombosis in the lower extremity. However, portions of this examination were limited- see technologist comments above.  - No cystic  structure found in the popliteal fossa.  *See table(s) above for measurements and observations.    Preliminary       Scheduled Meds: . albuterol  2 puff Inhalation BID  . allopurinol  100 mg Oral Daily  . aspirin  162 mg Oral Daily  . atorvastatin  80 mg Oral Daily  . diltiazem  180 mg Oral Daily  . DULoxetine  30 mg Oral Daily  . enoxaparin (LOVENOX) injection  40 mg Subcutaneous Q24H  . gabapentin  200 mg Oral BID  . insulin aspart  0-9 Units Subcutaneous TID WC  . mouth rinse  15 mL Mouth Rinse BID  . metoprolol tartrate  50 mg Oral BID  . pantoprazole  40 mg Oral Daily  . Zinc Oxide   Topical TID   Continuous Infusions:    LOS: 1 day      Time spent: 25 min  Dessa Phi, DO [facility] 07/23/2020, 12:31 PM   Available via Epic secure chat 7am-7pm After these hours, please refer to coverage provider listed on [email or web address]

## 2020-07-23 NOTE — [place] FL2 (Signed)
[place] MEDICAID FL2 LEVEL OF CARE SCREENING TOOL     IDENTIFICATION  Patient Name: Alyssa Ingram Birthdate: [birth year] Sex: female Admission Date (Current Location): 07/21/2020  [facility] and [place] Number:  Herbalist and Address:  [facility],  [place], [place]      Provider Number: [id number]  Attending Physician Name and Address:  Dessa Phi, DO  Relative Name and Phone Number:  Eustace Quail   Osborn Daughter   [phone]    Current Level of Care: Hospital Recommended Level of Care: [place] Prior Approval Number:    Date Approved/Denied:   PASRR Number: [id number]  Discharge Plan: SNF    Current Diagnoses: Patient Active Problem List   Diagnosis Date Noted   AKI (acute kidney injury) ([place]) 07/21/2020   Right elbow pain 05/06/2020   Throat pain 11/04/2019   Neuropathy 11/04/2019   Insomnia 03/09/2019   Daytime somnolence 07/17/2018   Paresthesia of bilateral legs 07/17/2018   Acute pharyngitis 07/17/2018   Greater trochanteric bursitis of left hip 01/30/2018   Low back pain 06/21/2017   Lateral epicondylitis of right elbow 06/20/2017   HLD (hyperlipidemia) 08/24/2016   Right shoulder pain 08/24/2016   Debility 08/10/2016   Viral illness 06/13/2016   Headache 12/09/2015   Degenerative arthritis of left knee 11/23/2015   Acute renal failure syndrome ([employer] [place]) 02/04/2015   Bilateral foot pain 01/26/2015   History of knee replacement procedure of right knee 08/06/2014   Arthritis of left lower extremity 08/06/2014   Cellulitis of leg, left 04/14/2014   Acute bronchitis 04/14/2014   Chronic venous insufficiency 04/14/2014   Left leg pain 03/12/2014   Patellar tendonitis of left knee 07/31/2013   Superficial phlebitis 01/31/2013   Chronic pain of right knee 01/31/2013   Diabetes ([place]) 01/31/2013   Allergic rhinitis 01/31/2013    Hypertrophic obstructive cardiomyopathy ([place]) 05/03/2012   Left lumbar radiculopathy 11/22/2011   Right knee pain 09/13/2011   Cough 09/13/2011   Arthritis    Mild persistent asthma    Cervical cancer (HCC)    GERD (gastroesophageal reflux disease)    Hypertension    Kidney stones    CHF (congestive heart failure) ([place])    Preventative health care 09/10/2011    Orientation RESPIRATION BLADDER Height & Weight     Self,Time,Situation,Place  O2 (3L) Incontinent Weight: 189 lb 9.5 oz (86 kg) Height:     BEHAVIORAL SYMPTOMS/MOOD NEUROLOGICAL BOWEL NUTRITION STATUS      Incontinent Diet (Regular diet)  AMBULATORY STATUS COMMUNICATION OF NEEDS Skin   Limited Assist Verbally Normal                       Personal Care Assistance Level of Assistance  Bathing,Dressing,Feeding Bathing Assistance: Limited assistance Feeding assistance: Independent Dressing Assistance: Limited assistance     Functional Limitations Info  Speech,Hearing,Sight Sight Info: Adequate Hearing Info: Adequate Speech Info: Adequate    SPECIAL CARE FACTORS FREQUENCY  PT (By licensed PT),OT (By licensed OT)     PT Frequency: Minimum 5x a week OT Frequency: Minimum 5x a Week            Contractures Contractures Info: Not present    Additional Factors Info  Code Status,Allergies,Insulin Sliding Scale Code Status Info: Full Code Allergies Info: Sulfa Antibiotics   Metformin And Related   Other   Prednisone   Insulin Sliding Scale Info: insulin aspart (novoLOG) injection 0-9 Units 3x a  day with meals       Current Medications (07/23/2020):  This is the current hospital active medication list Current Facility-Administered Medications  Medication Dose Route Frequency Provider Last Rate Last Admin   acetaminophen (TYLENOL) tablet 1,000 mg  1,000 mg Oral Q6H PRN Sharion Settler, NP   1,000 mg at 07/22/20 0931   albuterol (VENTOLIN HFA) 108 (90 Base) MCG/ACT inhaler 2 puff  2 puff  Inhalation BID Rise Patience, MD   2 puff at 07/23/20 0811   allopurinol (ZYLOPRIM) tablet 100 mg  100 mg Oral Daily Rise Patience, MD   100 mg at 07/23/20 1218   aspirin chewable tablet 162 mg  162 mg Oral Daily Rise Patience, MD   162 mg at 07/23/20 1217   atorvastatin (LIPITOR) tablet 80 mg  80 mg Oral Daily Rise Patience, MD   80 mg at 07/23/20 1217   diltiazem (CARDIZEM CD) 24 hr capsule 180 mg  180 mg Oral Daily Rise Patience, MD   180 mg at 07/23/20 1219   DULoxetine (CYMBALTA) DR capsule 30 mg  30 mg Oral Daily Rise Patience, MD   30 mg at 07/23/20 1218   enoxaparin (LOVENOX) injection 40 mg  40 mg Subcutaneous Q24H Rise Patience, MD   40 mg at 07/22/20 2200   gabapentin (NEURONTIN) capsule 200 mg  200 mg Oral BID Dessa Phi, DO   200 mg at 07/23/20 1217   insulin aspart (novoLOG) injection 0-9 Units  0-9 Units Subcutaneous TID WC Rise Patience, MD       MEDLINE mouth rinse  15 mL Mouth Rinse BID Rise Patience, MD   15 mL at 07/23/20 1219   metoprolol tartrate (LOPRESSOR) tablet 50 mg  50 mg Oral BID Rise Patience, MD   50 mg at 07/23/20 1218   pantoprazole (PROTONIX) EC tablet 40 mg  40 mg Oral Daily Rise Patience, MD   40 mg at 07/23/20 1218   Zinc Oxide (TRIPLE PASTE) 12.8 % ointment   Topical TID Dessa Phi, DO   Given at 07/23/20 1219     Discharge Medications: Please see discharge summary for a list of discharge medications.  Relevant Imaging Results:  Relevant Lab Results:   Additional Information SSN [id number]  Ross Ludwig, LCSW

## 2020-07-24 ENCOUNTER — Other Ambulatory Visit: Payer: Self-pay | Admitting: Internal Medicine

## 2020-07-24 ENCOUNTER — Inpatient Hospital Stay (HOSPITAL_COMMUNITY): Payer: Medicare Other

## 2020-07-24 ENCOUNTER — Other Ambulatory Visit (HOSPITAL_COMMUNITY): Payer: Medicare Other

## 2020-07-24 DIAGNOSIS — N179 Acute kidney failure, unspecified: Secondary | ICD-10-CM | POA: Diagnosis not present

## 2020-07-24 DIAGNOSIS — I422 Other hypertrophic cardiomyopathy: Secondary | ICD-10-CM | POA: Diagnosis not present

## 2020-07-24 LAB — BASIC METABOLIC PANEL
Anion gap: 10 (ref 5–15)
BUN: 16 mg/dL (ref 8–23)
CO2: 25 mmol/L (ref 22–32)
Calcium: 8.4 mg/dL — ABNORMAL LOW (ref 8.9–10.3)
Chloride: 108 mmol/L (ref 98–111)
Creatinine, Ser: 0.8 mg/dL (ref 0.44–1.00)
GFR, Estimated: >60 mL/min (ref >60)
Glucose, Bld: 93 mg/dL (ref 70–99)
Potassium: 3.8 mmol/L (ref 3.5–5.1)
Sodium: 143 mmol/L (ref 135–145)

## 2020-07-24 LAB — GLUCOSE, CAPILLARY
Glucose-Capillary: 95 mg/dL (ref 70–99)
Glucose-Capillary: 103 mg/dL — ABNORMAL HIGH (ref 70–99)
Glucose-Capillary: 111 mg/dL — ABNORMAL HIGH (ref 70–99)
Glucose-Capillary: 107 mg/dL — ABNORMAL HIGH (ref 70–99)

## 2020-07-24 LAB — ECHOCARDIOGRAM COMPLETE
AR max vel: 1.59 cm2
AV Area VTI: 1.57 cm2
AV Area mean vel: 1.61 cm2
AV Mean grad: 16.5 mmHg
AV Peak grad: 25.3 mmHg
Ao pk vel: 2.52 m/s
Area-P 1/2: 1.86 cm2
Calc EF: 72.3 %
MV M vel: 5.71 m/s
MV Peak grad: 130.4 mmHg
MV VTI: 1.11 cm2
Radius: 0.3 cm
S' Lateral: 1.2 cm
Single Plane A2C EF: 67.1 %
Single Plane A4C EF: 77.7 %
Weight: 3033.53 oz

## 2020-07-24 LAB — MAGNESIUM: Magnesium: 2 mg/dL (ref 1.7–2.4)

## 2020-07-24 MED ORDER — LIP MEDEX EX OINT
TOPICAL_OINTMENT | CUTANEOUS | Status: DC | PRN
  Filled 2020-07-24: qty 7

## 2020-07-24 MED ORDER — SALINE SPRAY 0.65 % NA SOLN
1.0000 | NASAL | Status: DC | PRN
  Administered 2020-07-24: 1 via NASAL
  Filled 2020-07-24: qty 44

## 2020-07-24 NOTE — Progress Notes (Signed)
PROGRESS NOTE    Alyssa Ingram  WHQ:[id number] DOB: 1945-07-27 DOA: 07/21/2020 PCP: [name], MD     Brief Narrative:  Alyssa Ingram is a 75 y.o. female with history of hypertrophic cardiomyopathy last EF measured in 2018 was 60 to 70% with grade 1 diastolic dysfunction with history of hypertension diabetes mellitus was brought to the ER after patient was found to be increasingly weak difficult to get up off the commode as per the patient's son who provided history to the ER physician.  Over the last 1 week patient has been falling increasingly difficult to ambulate because of weakness finding it difficult to get up from the commode.  Also has been having nonspecific pain all over.  Increase urination.  Denies any chest pain shortness of breath nausea vomiting or diarrhea.  Patient was admitted due to acute kidney injury, generalized weakness.  New events last 24 hours / Subjective: No acute events overnight, feeling well overall.  Agreeable to short-term rehab but wants to find the right facility for her.  Lower extremity pain has improved.  Assessment & Plan:   Principal Problem:   AKI (acute kidney injury) (Council) Active Problems:   Mild persistent asthma   GERD (gastroesophageal reflux disease)   Hypertrophic obstructive cardiomyopathy (HCC)   Diabetes (HCC)   AKI -Baseline creatinine 0.95 -Likely in setting of dehydration, prerenal etiology, lasix use  -Resolved with IV fluid  Demand ischemia -Elevated troponin 62 --> 63  Leukocytosis -Without overt sign of infectious process, leukocytosis resolved   History of hypertrophic cardiomyopathy, chronic diastolic heart failure -Admitted with dehydration, AKI -Continue Cardizem, Lopressor (looks like Dr. [name] Breed discontinued cardizem in 2018, unclear when it was restarted)  -Repeat echo pending -initially ordered 3/10 but has not been done yet.  Reordered today.  Patient without any cardiac complaints  Diabetes mellitus  type 2, well controlled -Sliding scale insulin  LLE pain -Neuropathy? Increased Neurontin dose  -DVT US negative -Improved   Hyperlipidemia -Continue Lipitor  Generalized weakness -PT OT recommending SNF placement    DVT prophylaxis:  enoxaparin (LOVENOX) injection 40 mg Start: 07/21/20 2300  Code Status: Full code Family Communication: None at bedside, son did not answer phone call today Disposition Plan:  Status is: Inpatient  Remains inpatient appropriate because:Unsafe d/c plan   Dispo: The patient is from: Home              Anticipated d/c is to: SNF              Patient currently is not medically stable to d/c.  Echo pending.  PT recommending SNF placement   Difficult to place patient No    Antimicrobials:  Anti-infectives (From admission, onward)   None       Objective: Vitals:   07/23/20 2016 07/23/20 2253 07/24/20 0630 07/24/20 0759  BP: 132/63 138/67 (!) 147/74   Pulse: (!) 52 (!) 54 (!) 58   Resp: 18  18   Temp: 98.2 F (36.8 C)  98.1 F (36.7 C)   TempSrc: Oral  Oral   SpO2: 97% 96% 92% 94%  Weight:        Intake/Output Summary (Last 24 hours) at 07/24/2020 1058 Last data filed at 07/24/2020 6599 Gross per 24 hour  Intake 120 ml  Output --  Net 120 ml   Filed Weights   07/22/20 0500  Weight: 86 kg    Examination: General exam: Appears calm and comfortable  Respiratory system: Clear to auscultation. Respiratory  effort normal.  On oxygen Cardiovascular system: S1 & S2 heard, RRR. No pedal edema. Gastrointestinal system: Abdomen is nondistended, soft and nontender. Normal bowel sounds heard. Central nervous system: Alert and oriented. Non focal exam. Speech clear  Extremities: Symmetric in appearance bilaterally  Psychiatry: Judgement and insight appear stable. Mood & affect appropriate.    Data Reviewed: I have personally reviewed following labs and imaging studies  CBC: Recent Labs  Lab 07/21/20 1738 07/22/20 0404  07/23/20 0405  WBC 12.5* 10.7* 9.7  NEUTROABS 10.6*  --   --   HGB 13.6 11.4* 10.8*  HCT 43.1 36.0 34.4*  MCV 87.2 87.6 87.5  PLT 205 194 322   Basic Metabolic Panel: Recent Labs  Lab 07/21/20 1738 07/22/20 0404 07/23/20 0405 07/24/20 0442  NA 140 140 142 143  K 5.1 3.1* 3.4* 3.8  CL 99 101 105 108  CO2 26 28 26 25   GLUCOSE 134* 120* 82 93  BUN 27* 32* 24* 16  CREATININE 1.68* 1.36* 0.91 0.80  CALCIUM 8.6* 8.1* 8.2* 8.4*  MG  --   --  2.0 2.0   GFR: Estimated Creatinine Clearance: 61.5 mL/min (by C-G formula based on SCr of 0.8 mg/dL). Liver Function Tests: Recent Labs  Lab 07/21/20 1738  AST 56*  ALT 30  ALKPHOS 65  BILITOT 1.9*  PROT 7.7  ALBUMIN 3.2*   No results for input(s): LIPASE, AMYLASE in the last 168 hours. No results for input(s): AMMONIA in the last 168 hours. Coagulation Profile: No results for input(s): INR, PROTIME in the last 168 hours. Cardiac Enzymes: Recent Labs  Lab 07/22/20 0626  CKTOTAL 271*   BNP (last 3 results) No results for input(s): PROBNP in the last 8760 hours. HbA1C: No results for input(s): HGBA1C in the last 72 hours. CBG: Recent Labs  Lab 07/23/20 0816 07/23/20 1142 07/23/20 1655 07/23/20 2232 07/24/20 0742  GLUCAP 73 96 96 95 95   Lipid Profile: No results for input(s): CHOL, HDL, LDLCALC, TRIG, CHOLHDL, LDLDIRECT in the last 72 hours. Thyroid Function Tests: Recent Labs    07/22/20 0626  TSH 0.754   Anemia Panel: No results for input(s): VITAMINB12, FOLATE, FERRITIN, TIBC, IRON, RETICCTPCT in the last 72 hours. Sepsis Labs: No results for input(s): PROCALCITON, LATICACIDVEN in the last 168 hours.  Recent Results (from the past 240 hour(s))  Resp Panel by RT-PCR (Flu A&B, Covid) Nasopharyngeal Swab     Status: None   Collection Time: 07/21/20 10:14 PM   Specimen: Nasopharyngeal Swab; Nasopharyngeal(NP) swabs in vial transport medium  Result Value Ref Range Status   SARS Coronavirus 2 by RT PCR  NEGATIVE NEGATIVE Final    Comment: (NOTE) SARS-CoV-2 target nucleic acids are NOT DETECTED.  The SARS-CoV-2 RNA is generally detectable in upper respiratory specimens during the acute phase of infection. The lowest concentration of SARS-CoV-2 viral copies this assay can detect is 138 copies/mL. A negative result does not preclude SARS-Cov-2 infection and should not be used as the sole basis for treatment or other patient management decisions. A negative result may occur with  improper specimen collection/handling, submission of specimen other than nasopharyngeal swab, presence of viral mutation(s) within the areas targeted by this assay, and inadequate number of viral copies(<138 copies/mL). A negative result must be combined with clinical observations, patient history, and epidemiological information. The expected result is Negative.  Fact Sheet for Patients:  EntrepreneurPulse.com.au  Fact Sheet for Healthcare Providers:  IncredibleEmployment.be  This test is no t yet approved or  cleared by the [place] and  has been authorized for detection and/or diagnosis of SARS-CoV-2 by FDA under an Emergency Use Authorization (EUA). This EUA will remain  in effect (meaning this test can be used) for the duration of the COVID-19 declaration under Section 564(b)(1) of the Act, 21 U.S.C.section 360bbb-3(b)(1), unless the authorization is terminated  or revoked sooner.       Influenza A by PCR NEGATIVE NEGATIVE Final   Influenza B by PCR NEGATIVE NEGATIVE Final    Comment: (NOTE) The Xpert Xpress SARS-CoV-2/FLU/RSV plus assay is intended as an aid in the diagnosis of influenza from Nasopharyngeal swab specimens and should not be used as a sole basis for treatment. Nasal washings and aspirates are unacceptable for Xpert Xpress SARS-CoV-2/FLU/RSV testing.  Fact Sheet for Patients: EntrepreneurPulse.com.au  Fact Sheet for  Healthcare Providers: IncredibleEmployment.be  This test is not yet approved or cleared by the [place] FDA and has been authorized for detection and/or diagnosis of SARS-CoV-2 by FDA under an Emergency Use Authorization (EUA). This EUA will remain in effect (meaning this test can be used) for the duration of the COVID-19 declaration under Section 564(b)(1) of the Act, 21 U.S.C. section 360bbb-3(b)(1), unless the authorization is terminated or revoked.  Performed at [facility], [place] [address], [place], [place] [address]       Radiology Studies: VAS [place] LOWER EXTREMITY VENOUS (DVT)  Result Date: 07/22/2020  Lower Venous DVT Study Indications: Edema.  Risk Factors: None identified. Limitations: Body habitus, poor ultrasound/tissue interface and patient positioning, patient pain tolerance, calf induration. Comparison Study: No prior studies. Performing Technologist: Oliver Hum RVT  Examination Guidelines: A complete evaluation includes B-mode imaging, spectral Doppler, color Doppler, and power Doppler as needed of all accessible portions of each vessel. Bilateral testing is considered an integral part of a complete examination. Limited examinations for reoccurring indications may be performed as noted. The reflux portion of the exam is performed with the patient in reverse Trendelenburg.  +---------+---------------+---------+-----------+----------+-------------------+ RIGHT    CompressibilityPhasicitySpontaneityPropertiesThrombus Aging      +---------+---------------+---------+-----------+----------+-------------------+ CFV      Full           Yes      Yes                                      +---------+---------------+---------+-----------+----------+-------------------+ SFJ      Full                                                             +---------+---------------+---------+-----------+----------+-------------------+ FV  Prox  Full                                                             +---------+---------------+---------+-----------+----------+-------------------+ FV Mid   Full                                                             +---------+---------------+---------+-----------+----------+-------------------+  FV DistalFull                                                             +---------+---------------+---------+-----------+----------+-------------------+ PFV      Full                                                             +---------+---------------+---------+-----------+----------+-------------------+ POP      Full           Yes      Yes                                      +---------+---------------+---------+-----------+----------+-------------------+ PTV      Full                                                             +---------+---------------+---------+-----------+----------+-------------------+ PERO                                                  Not well visualized +---------+---------------+---------+-----------+----------+-------------------+   +---------+---------------+---------+-----------+----------+-------------------+ LEFT     CompressibilityPhasicitySpontaneityPropertiesThrombus Aging      +---------+---------------+---------+-----------+----------+-------------------+ CFV      Full           Yes      Yes                                      +---------+---------------+---------+-----------+----------+-------------------+ SFJ      Full                                                             +---------+---------------+---------+-----------+----------+-------------------+ FV Prox  Full                                                             +---------+---------------+---------+-----------+----------+-------------------+ FV Mid   Full                                                              +---------+---------------+---------+-----------+----------+-------------------+ FV Distal  Yes      Yes                                      +---------+---------------+---------+-----------+----------+-------------------+ PFV      Full                                                             +---------+---------------+---------+-----------+----------+-------------------+ POP      Full           Yes      Yes                                      +---------+---------------+---------+-----------+----------+-------------------+ PTV                                                   Not well visualized +---------+---------------+---------+-----------+----------+-------------------+ PERO                                                  Not well visualized +---------+---------------+---------+-----------+----------+-------------------+    Summary: RIGHT: - There is no evidence of deep vein thrombosis in the lower extremity. However, portions of this examination were limited- see technologist comments above.  - No cystic structure found in the popliteal fossa.  LEFT: - There is no evidence of deep vein thrombosis in the lower extremity. However, portions of this examination were limited- see technologist comments above.  - No cystic structure found in the popliteal fossa.  *See table(s) above for measurements and observations.    Preliminary       Scheduled Meds: . albuterol  2 puff Inhalation BID  . allopurinol  100 mg Oral Daily  . aspirin  162 mg Oral Daily  . atorvastatin  80 mg Oral Daily  . diltiazem  180 mg Oral Daily  . DULoxetine  30 mg Oral Daily  . enoxaparin (LOVENOX) injection  40 mg Subcutaneous Q24H  . gabapentin  200 mg Oral BID  . insulin aspart  0-9 Units Subcutaneous TID WC  . mouth rinse  15 mL Mouth Rinse BID  . metoprolol tartrate  50 mg Oral BID  . pantoprazole  40 mg Oral Daily  . Zinc Oxide   Topical TID   Continuous  Infusions:    LOS: 2 days      Time spent: 20 min  Dessa Phi, DO [facility] 07/24/2020, 10:58 AM   Available via Epic secure chat 7am-7pm After these hours, please refer to coverage provider listed on [email or web address]

## 2020-07-24 NOTE — Progress Notes (Signed)
°  Echocardiogram 2D Echocardiogram has been performed.  Alyssa Ingram 07/24/2020, 11:07 AM

## 2020-07-25 ENCOUNTER — Other Ambulatory Visit (HOSPITAL_COMMUNITY): Payer: Medicare Other

## 2020-07-25 DIAGNOSIS — N179 Acute kidney failure, unspecified: Secondary | ICD-10-CM | POA: Diagnosis not present

## 2020-07-25 LAB — GLUCOSE, CAPILLARY
Glucose-Capillary: 97 mg/dL (ref 70–99)
Glucose-Capillary: 135 mg/dL — ABNORMAL HIGH (ref 70–99)
Glucose-Capillary: 129 mg/dL — ABNORMAL HIGH (ref 70–99)
Glucose-Capillary: 132 mg/dL — ABNORMAL HIGH (ref 70–99)

## 2020-07-25 MED ORDER — DILTIAZEM HCL ER COATED BEADS 120 MG PO CP24: 120.0000 mg | ORAL_CAPSULE | Freq: Every day | ORAL | Status: DC

## 2020-07-25 NOTE — Telephone Encounter (Signed)
Please refill as per office routine med refill policy (all routine meds refilled for 3 mo or monthly per pt preference up to one year from last visit, then month to month grace period for 3 mo, then further med refills will have to be denied)  

## 2020-07-25 NOTE — Progress Notes (Signed)
PROGRESS NOTE    Alyssa Ingram  WFU:[id number] DOB: [birth year] DOA: 07/21/2020 PCP: [name], MD     Brief Narrative:  Alyssa Ingram is a 75 y.o. female with history of hypertrophic cardiomyopathy last EF measured in 2018 was 61 to 70% with grade 1 diastolic dysfunction with history of hypertension diabetes mellitus was brought to the ER after patient was found to be increasingly weak difficult to get up off the commode as per the patient's son who provided history to the ER physician.  Over the last 1 week patient has been falling increasingly difficult to ambulate because of weakness finding it difficult to get up from the commode.  Also has been having nonspecific pain all over.  Increase urination.  Denies any chest pain shortness of breath nausea vomiting or diarrhea.  Patient was admitted due to acute kidney injury, generalized weakness.  New events last 24 hours / Subjective: Patient states that she feels that she was at a 10% when she was admitted, currently feeling 95%.  Awaiting SNF placement.  She denies any new complaints, no chest pain or shortness of breath, lower extremity pain has improved.  Assessment & Plan:   Principal Problem:   AKI (acute kidney injury) ([place]) Active Problems:   Mild persistent asthma   GERD (gastroesophageal reflux disease)   Hypertrophic obstructive cardiomyopathy (HCC)   Diabetes (HCC)   AKI -Baseline creatinine 0.95 -Likely in setting of dehydration, prerenal etiology, lasix use  -Resolved with IV fluid  Demand ischemia -Elevated troponin 62 --> 63  Leukocytosis -Without overt sign of infectious process, leukocytosis resolved   History of hypertrophic cardiomyopathy, chronic diastolic heart failure -Admitted with dehydration, AKI -Continue Cardizem, Lopressor (looks like Dr. [name] Breed discontinued cardizem in 2018, unclear when it was restarted)  -Echocardiogram with findings consistent with hypertrophic cardiomyopathy -Patient  without any worsening signs or symptoms of decompensation at this time  Diabetes mellitus type 2, well controlled -Sliding scale insulin  LLE pain -Neuropathy? Increased Neurontin dose  -DVT US negative -Improved   Hyperlipidemia -Continue Lipitor  Generalized weakness -PT OT recommending SNF placement    DVT prophylaxis:  enoxaparin (LOVENOX) injection 40 mg Start: 07/21/20 2300  Code Status: Full code Family Communication: None at bedside Disposition Plan:  Status is: Inpatient  Remains inpatient appropriate because:Unsafe d/c plan   Dispo: The patient is from: Home              Anticipated d/c is to: SNF              Patient currently is medically stable to d/c.  Awaiting SNF placement   Difficult to place patient No    Antimicrobials:  Anti-infectives (From admission, onward)   None       Objective: Vitals:   07/25/20 0455 07/25/20 0813 07/25/20 0958 07/25/20 1004  BP:   (!) 142/63 (!) 142/63  Pulse:   (!) 50 (!) 50  Resp:      Temp:      TempSrc:      SpO2:  90%    Weight: 124 kg       Intake/Output Summary (Last 24 hours) at 07/25/2020 1121 Last data filed at 07/25/2020 1028 Gross per 24 hour  Intake 360 ml  Output --  Net 360 ml   Filed Weights   07/22/20 0500 07/25/20 0455  Weight: 86 kg 124 kg    Examination: General exam: Appears calm and comfortable  Respiratory system: Clear to auscultation. Respiratory effort normal.  Cardiovascular system: S1 & S2 heard, RRR. No pedal edema. Gastrointestinal system: Abdomen is nondistended, soft and nontender. Normal bowel sounds heard. Central nervous system: Alert and oriented. Non focal exam. Speech clear  Extremities: Symmetric in appearance bilaterally  Skin: Bilateral shins with chronic venous stasis changes Psychiatry: Judgement and insight appear stable. Mood & affect appropriate.     Data Reviewed: I have personally reviewed following labs and imaging studies  CBC: Recent Labs  Lab  07/21/20 1738 07/22/20 0404 07/23/20 0405  WBC 12.5* 10.7* 9.7  NEUTROABS 10.6*  --   --   HGB 13.6 11.4* 10.8*  HCT 43.1 36.0 34.4*  MCV 87.2 87.6 87.5  PLT 205 194 191   Basic Metabolic Panel: Recent Labs  Lab 07/21/20 1738 07/22/20 0404 07/23/20 0405 07/24/20 0442  NA 140 140 142 143  K 5.1 3.1* 3.4* 3.8  CL 99 101 105 108  CO2 _0 GLUCOSE 134* 120* 82 93  BUN 27* 32* 24* 16  CREATININE 1.68* 1.36* 0.91 0.80  CALCIUM 8.6* 8.1* 8.2* 8.4*  MG  --   --  2.0 2.0   GFR: Estimated Creatinine Clearance: 76.3 mL/min (by C-G formula based on SCr of 0.8 mg/dL). Liver Function Tests: Recent Labs  Lab 07/21/20 1738  AST 56*  ALT 30  ALKPHOS 65  BILITOT 1.9*  PROT 7.7  ALBUMIN 3.2*   No results for input(s): LIPASE, AMYLASE in the last 168 hours. No results for input(s): AMMONIA in the last 168 hours. Coagulation Profile: No results for input(s): INR, PROTIME in the last 168 hours. Cardiac Enzymes: Recent Labs  Lab 07/22/20 0626  CKTOTAL 271*   BNP (last 3 results) No results for input(s): PROBNP in the last 8760 hours. HbA1C: No results for input(s): HGBA1C in the last 72 hours. CBG: Recent Labs  Lab 07/24/20 0742 07/24/20 1129 07/24/20 1610 07/24/20 2052 07/25/20 0723  GLUCAP 95 103* 111* 107* 97   Lipid Profile: No results for input(s): CHOL, HDL, LDLCALC, TRIG, CHOLHDL, LDLDIRECT in the last 72 hours. Thyroid Function Tests: No results for input(s): TSH, T4TOTAL, FREET4, T3FREE, THYROIDAB in the last 72 hours. Anemia Panel: No results for input(s): VITAMINB12, FOLATE, FERRITIN, TIBC, IRON, RETICCTPCT in the last 72 hours. Sepsis Labs: No results for input(s): PROCALCITON, LATICACIDVEN in the last 168 hours.  Recent Results (from the past 240 hour(s))  Resp Panel by RT-PCR (Flu A&B, Covid) Nasopharyngeal Swab     Status: None   Collection Time: 07/21/20 10:14 PM   Specimen: Nasopharyngeal Swab; Nasopharyngeal(NP) swabs in vial transport  medium  Result Value Ref Range Status   SARS Coronavirus 2 by RT PCR NEGATIVE NEGATIVE Final    Comment: (NOTE) SARS-CoV-2 target nucleic acids are NOT DETECTED.  The SARS-CoV-2 RNA is generally detectable in upper respiratory specimens during the acute phase of infection. The lowest concentration of SARS-CoV-2 viral copies this assay can detect is 138 copies/mL. A negative result does not preclude SARS-Cov-2 infection and should not be used as the sole basis for treatment or other patient management decisions. A negative result may occur with  improper specimen collection/handling, submission of specimen other than nasopharyngeal swab, presence of viral mutation(s) within the areas targeted by this assay, and inadequate number of viral copies(<138 copies/mL). A negative result must be combined with clinical observations, patient history, and epidemiological information. The expected result is Negative.  Fact Sheet for Patients:  EntrepreneurPulse.com.au  Fact Sheet for Healthcare Providers:  IncredibleEmployment.be  This test  is no t yet approved or cleared by the [place] and  has been authorized for detection and/or diagnosis of SARS-CoV-2 by FDA under an Emergency Use Authorization (EUA). This EUA will remain  in effect (meaning this test can be used) for the duration of the COVID-19 declaration under Section 564(b)(1) of the Act, 21 U.S.C.section 360bbb-3(b)(1), unless the authorization is terminated  or revoked sooner.       Influenza A by PCR NEGATIVE NEGATIVE Final   Influenza B by PCR NEGATIVE NEGATIVE Final    Comment: (NOTE) The Xpert Xpress SARS-CoV-2/FLU/RSV plus assay is intended as an aid in the diagnosis of influenza from Nasopharyngeal swab specimens and should not be used as a sole basis for treatment. Nasal washings and aspirates are unacceptable for Xpert Xpress SARS-CoV-2/FLU/RSV testing.  Fact Sheet for  Patients: EntrepreneurPulse.com.au  Fact Sheet for Healthcare Providers: IncredibleEmployment.be  This test is not yet approved or cleared by the [place] FDA and has been authorized for detection and/or diagnosis of SARS-CoV-2 by FDA under an Emergency Use Authorization (EUA). This EUA will remain in effect (meaning this test can be used) for the duration of the COVID-19 declaration under Section 564(b)(1) of the Act, 21 U.S.C. section 360bbb-3(b)(1), unless the authorization is terminated or revoked.  Performed at [facility], [place] [address], [place], [place] [address]       Radiology Studies: ECHOCARDIOGRAM COMPLETE  Result Date: 07/24/2020    ECHOCARDIOGRAM REPORT   Patient Name:   VERNESTINE BRODHEAD Date of Exam: 07/24/2020 Medical Rec #:  [id number]        Height:       61.0 in Accession #:    [id number]       Weight:       189.6 lb Date of Birth:  [birth year]        BSA:          1.846 m Patient Age:    87 years         BP:           147/74 mmHg Patient Gender: F                HR:           64 bpm. Exam Location:  Inpatient Procedure: 2D Echo, 3D Echo, Cardiac Doppler and Color Doppler Indications:    Hypertrophic cardiomyopathy.; I42.9 Cardiomyopathy (unspecified)  History:        Patient has prior history of Echocardiogram examinations, most                 recent 10/06/2016. CHF, Cardiomyopathy and Hypertrophic                 Cardiomyopathy, Signs/Symptoms:Dyspnea and Shortness of Breath;                 Risk Factors:Hypertension, Dyslipidemia and Diabetes. Cancer.  Sonographer:    Roseanna Rainbow RDCS Referring Phys: [id number] [facility]  Sonographer Comments: Technically difficult study due to poor echo windows. Extremely difficult exam due to patient bed. Patient positioned up against the side of the bed. Patient having leg pain. IMPRESSIONS  1. Severe asymmetric hypertrophy of the basal septum up to 2.4 cm. There is SAM of the MV  with LVOT obstruction up to 42-47 mmHG. The MR is central and moderate, rather than posteriorly directed. This is due to significant posterior MAC. Findings are consistent with hypertrophic cardiomyopathy which is known . Left ventricular ejection fraction,  by estimation, is 70 to 75%. The left ventricle has hyperdynamic function. The left ventricle has no regional wall motion abnormalities. There is severe asymmetric left ventricular hypertrophy of the basal-septal segment. Left ventricular diastolic function could not be evaluated.  2. Right ventricular systolic function is normal. The right ventricular size is normal. There is moderately elevated pulmonary artery systolic pressure. The estimated right ventricular systolic pressure is 82.4 mmHg.  3. Left atrial size was severely dilated.  4. Mild to moderate calcific mitral stenosis. MG 4 mmHG @ 64 bpm. MVA by VTI 1.1 cm2. DVI 0.48. The mitral valve is degenerative. Mild to moderate mitral valve regurgitation. Moderate mitral stenosis. The mean mitral valve gradient is 4.0 mmHg with average heart rate of 64 bpm. Severe mitral annular calcification.  5. The aortic valve is tricuspid. There is mild calcification of the aortic valve. There is mild thickening of the aortic valve. Aortic valve regurgitation is not visualized. Mild aortic valve sclerosis is present, with no evidence of aortic valve stenosis.  6. The inferior vena cava is dilated in size with >50% respiratory variability, suggesting right atrial pressure of 8 mmHg. FINDINGS  Left Ventricle: Severe asymmetric hypertrophy of the basal septum up to 2.4 cm. There is SAM of the MV with LVOT obstruction up to 42-47 mmHG. The MR is central and moderate, rather than posteriorly directed. This is due to significant posterior MAC. Findings are consistent with hypertrophic cardiomyopathy which is known. Left ventricular ejection fraction, by estimation, is 70 to 75%. The left ventricle has hyperdynamic function.  The left ventricle has no regional wall motion abnormalities. The left  ventricular internal cavity size was small. There is severe asymmetric left ventricular hypertrophy of the basal-septal segment. Left ventricular diastolic function could not be evaluated due to mitral annular calcification (moderate or greater). Left ventricular diastolic function could not be evaluated. Right Ventricle: The right ventricular size is normal. No increase in right ventricular wall thickness. Right ventricular systolic function is normal. There is moderately elevated pulmonary artery systolic pressure. The tricuspid regurgitant velocity is 3.67 m/s, and with an assumed right atrial pressure of 8 mmHg, the estimated right ventricular systolic pressure is 23.5 mmHg. Left Atrium: Left atrial size was severely dilated. Right Atrium: Right atrial size was normal in size. Pericardium: Trivial pericardial effusion is present. Mitral Valve: Mild to moderate calcific mitral stenosis. MG 4 mmHG @ 64 bpm. MVA by VTI 1.1 cm2. DVI 0.48. The mitral valve is degenerative in appearance. Severe mitral annular calcification. Mild to moderate mitral valve regurgitation. Moderate mitral valve stenosis. MV peak gradient, 15.8 mmHg. The mean mitral valve gradient is 4.0 mmHg with average heart rate of 64 bpm. Tricuspid Valve: The tricuspid valve is grossly normal. Tricuspid valve regurgitation is mild . No evidence of tricuspid stenosis. Aortic Valve: The aortic valve is tricuspid. There is mild calcification of the aortic valve. There is mild thickening of the aortic valve. Aortic valve regurgitation is not visualized. Mild aortic valve sclerosis is present, with no evidence of aortic valve stenosis. Aortic valve mean gradient measures 16.5 mmHg. Aortic valve peak gradient measures 25.3 mmHg. Aortic valve area, by VTI measures 1.57 cm. Pulmonic Valve: The pulmonic valve was grossly normal. Pulmonic valve regurgitation is trivial. No evidence of  pulmonic stenosis. Aorta: The aortic root and ascending aorta are structurally normal, with no evidence of dilitation. Venous: The inferior vena cava is dilated in size with greater than 50% respiratory variability, suggesting right atrial pressure of 8 mmHg. IAS/Shunts:  The atrial septum is grossly normal.  LEFT VENTRICLE PLAX 2D LVIDd:         3.30 cm     Diastology LVIDs:         1.20 cm     LV e' medial:    4.24 cm/s LV PW:         1.18 cm     LV E/e' medial:  38.9 LV IVS:        2.40 cm     LV e' lateral:   4.68 cm/s LVOT diam:     1.70 cm     LV E/e' lateral: 35.3 LV SV:         68 LV SV Index:   37 LVOT Area:     2.27 cm  LV Volumes (MOD) LV vol d, MOD A2C: 43.4 ml LV vol d, MOD A4C: 83.9 ml LV vol s, MOD A2C: 14.3 ml LV vol s, MOD A4C: 18.7 ml LV SV MOD A2C:     29.1 ml LV SV MOD A4C:     83.9 ml LV SV MOD BP:      47.2 ml RIGHT VENTRICLE             IVC RV S prime:     14.70 cm/s  IVC diam: 2.40 cm TAPSE (M-mode): 1.9 cm LEFT ATRIUM              Index       RIGHT ATRIUM           Index LA diam:        5.20 cm  2.82 cm/m  RA Area:     12.90 cm LA Vol (A2C):   116.0 ml 62.83 ml/m RA Volume:   29.10 ml  15.76 ml/m LA Vol (A4C):   125.0 ml 67.70 ml/m LA Biplane Vol: 121.0 ml 65.53 ml/m  AORTIC VALVE                    PULMONIC VALVE AV Area (Vmax):    1.59 cm     PV Vmax:          1.93 m/s AV Area (Vmean):   1.61 cm     PV Peak grad:     14.9 mmHg AV Area (VTI):     1.57 cm     PR End Diast Vel: 1.97 msec AV Vmax:           251.50 cm/s AV Vmean:          194.500 cm/s AV VTI:            0.436 m AV Peak Grad:      25.3 mmHg AV Mean Grad:      16.5 mmHg LVOT Vmax:         176.00 cm/s LVOT Vmean:        138.000 cm/s LVOT VTI:          0.301 m LVOT/AV VTI ratio: 0.69  AORTA Ao Root diam: 3.20 cm MITRAL VALVE                 TRICUSPID VALVE MV Area (PHT): 1.86 cm      TR Peak grad:   53.9 mmHg MV Area VTI:   1.11 cm      TR Vmax:        367.00 cm/s MV Peak grad:  15.8 mmHg MV Mean grad:  4.0 mmHg       SHUNTS MV Vmax:  1.99 m/s      Systemic VTI:  0.30 m MV Vmean:      88.4 cm/s     Systemic Diam: 1.70 cm MV VTI:        0.62 m MV Decel Time: 408 msec MR Peak grad:    130.4 mmHg MR Mean grad:    91.0 mmHg MR Vmax:         571.00 cm/s MR Vmean:        457.0 cm/s MR PISA:         0.57 cm MR PISA Eff ROA: 3 mm MR PISA Radius:  0.30 cm MV E velocity: 165.00 cm/s MV A velocity: 142.00 cm/s MV E/A ratio:  1.16 Eleonore Chiquito MD Electronically signed by Eleonore Chiquito MD Signature Date/Time: 07/24/2020/1:37:54 PM    Final       Scheduled Meds: . albuterol  2 puff Inhalation BID  . allopurinol  100 mg Oral Daily  . aspirin  162 mg Oral Daily  . atorvastatin  80 mg Oral Daily  . diltiazem  180 mg Oral Daily  . DULoxetine  30 mg Oral Daily  . enoxaparin (LOVENOX) injection  40 mg Subcutaneous Q24H  . gabapentin  200 mg Oral BID  . insulin aspart  0-9 Units Subcutaneous TID WC  . mouth rinse  15 mL Mouth Rinse BID  . metoprolol tartrate  50 mg Oral BID  . pantoprazole  40 mg Oral Daily  . Zinc Oxide   Topical TID   Continuous Infusions:    LOS: 3 days      Time spent: 20 min  Dessa Phi, DO [facility] 07/25/2020, 11:21 AM   Available via Epic secure chat 7am-7pm After these hours, please refer to coverage provider listed on [email or web address]

## 2020-07-25 NOTE — Progress Notes (Signed)
Pt HR consistently in 50s, will hold Metoprolol scheduled at 2200. On call [place] notified and Pt educated.

## 2020-07-26 DIAGNOSIS — N179 Acute kidney failure, unspecified: Secondary | ICD-10-CM | POA: Diagnosis not present

## 2020-07-26 LAB — GLUCOSE, CAPILLARY
Glucose-Capillary: 111 mg/dL — ABNORMAL HIGH (ref 70–99)
Glucose-Capillary: 119 mg/dL — ABNORMAL HIGH (ref 70–99)
Glucose-Capillary: 128 mg/dL — ABNORMAL HIGH (ref 70–99)
Glucose-Capillary: 112 mg/dL — ABNORMAL HIGH (ref 70–99)

## 2020-07-26 MED ORDER — GABAPENTIN 100 MG PO CAPS: 200.0000 mg | ORAL_CAPSULE | Freq: Two times a day (BID) | ORAL | 0 refills | Status: DC

## 2020-07-26 MED ORDER — DILTIAZEM HCL ER COATED BEADS 120 MG PO CP24: 120.0000 mg | ORAL_CAPSULE | Freq: Every day | ORAL | 0 refills | Status: DC

## 2020-07-26 MED ORDER — METOPROLOL TARTRATE 25 MG PO TABS
25.0000 mg | ORAL_TABLET | Freq: Two times a day (BID) | ORAL | Status: DC
  Administered 2020-07-26 – 2020-07-27 (×2): 25 mg via ORAL
  Filled 2020-07-26 (×3): qty 1

## 2020-07-26 MED ORDER — METOPROLOL TARTRATE 25 MG PO TABS: 25.0000 mg | ORAL_TABLET | Freq: Two times a day (BID) | ORAL | 0 refills | Status: DC

## 2020-07-26 NOTE — Progress Notes (Signed)
Physical Therapy Treatment Patient Details Name: Alyssa Ingram MRN: [id number] DOB: 01-08-1946 Today's Date: 07/26/2020    History of Present Illness Alyssa Ingram is a 75 y.o. female admitted on 07/21/20 with  increasing weakness, difficult to get up off the commode as per the patient's son and Admitted for generalized weakness dehydration acute renal failure. Pt with history of hypertrophic cardiomyopathy last EF measured in 2018 was 65 to 70% with grade 1 diastolic dysfunction hypertension diabetes mellitus    PT Comments    Pt making some progress today but still required max x 2, high fall risk, posterior lean, and not able to take steps away from bed or pivot to chair.  Noted family/pt had declined SNF and prefer to take pt home, discussed with pt and called son to notify of current mobility level - he reports will discuss with sister.  Continue to recommend SNF but if family decide home then will need max HHservices, w/c, BSC, hospital bed, and PTAR transport.    Noted at eval unable to obtain PLOF.  Spoke with son and pt who reports that pt was ambulatory with and without rollator independently.  Report that she is normally able to do ADLs and ambulate into MD appointments.  She lives on first floor of town home and has a curb but no steps to enter.      Follow Up Recommendations  SNF     Equipment Recommendations  Wheelchair cushion (measurements PT);Wheelchair (measurements PT);3in1 (PT);Hospital bed    Recommendations for Other Services       Precautions / Restrictions Precautions Precautions: Fall Restrictions Weight Bearing Restrictions: No    Mobility  Bed Mobility Overal bed mobility: Needs Assistance Bed Mobility: Supine to Sit;Sit to Supine     Supine to sit: Min assist;+2 for physical assistance Sit to supine: Max assist;+2 for physical assistance   General bed mobility comments: Supine to sit: requiring cues for sequencing , pt able to get legs off bed  and reach for rail but required min A of 2 to lift trunk and scoot forward.  Sit to supine: Pt began leaning back so required max x 2 helicopter technique to return to bed to prevent sliding.    Transfers Overall transfer level: Needs assistance Equipment used: 4-wheeled walker Transfers: Sit to/from Stand Sit to Stand: Max assist;+2 physical assistance         General transfer comment: Performed 2 sit to stands with max x 2 , increased time to rise, cues for hand placement, posture, and standing all the way up.  Requiring support at gait belt and buttock to gain standing posture and pt still with legs pushed back into bed for support.  Ambulation/Gait Ambulation/Gait assistance: Max assist;+2 physical assistance Gait Distance (Feet): 1 Feet Assistive device: 4-wheeled walker Gait Pattern/deviations: Step-to pattern;Decreased stride length;Shuffle Gait velocity: decreased   General Gait Details: Pt took 2 side steps at EOB with max x 2 , rollator, support of bed on posterior legs, and assist to move RW   Stairs             Wheelchair Mobility    Modified Rankin (Stroke Patients Only)       Balance Overall balance assessment: History of Falls;Needs assistance Sitting-balance support: Feet supported;Bilateral upper extremity supported Sitting balance-Leahy Scale: Poor Sitting balance - Comments: Pt initially with posterior lean and having difficulty correcting with verbal cues.  Obtained arm chair and placed in front of pt to have her reach for armrest -  in this position able to maintain with min guard. Able to progress back to hands on knees/bed after several mins. Pt still with tendency to posterior pelvic tilt and lean posteriorly but only requiring min guard for static sit.  At EOB at least 15 mins during session.     Standing balance-Leahy Scale: Poor Standing balance comment: Requiring mod-max x 2 to maintain standing with rollator and legs pushing posteriorly in  bed; only standing 20-40 sec at a time and with abrupt sit                            Cognition Arousal/Alertness: Awake/alert Behavior During Therapy: Anxious (anxious for OOB) Overall Cognitive Status: No family/caregiver present to determine baseline cognitive functioning Area of Impairment: Problem solving;Safety/judgement                         Safety/Judgement: Decreased awareness of safety;Decreased awareness of deficits   Problem Solving: Slow processing;Decreased initiation;Difficulty sequencing;Requires verbal cues;Requires tactile cues General Comments: Pt very pleasant and motivated but with decreased awareness of her deficits with mobility - stating "I did pretty good" when she required max x 2 assist.  She also felt she did not do as well as possible b/c not having her shoes.  Pt stating wants to go home - discussed how much help she required.  Educated on SNF vs home with max A x 2, ambulance transfer, hospital bed, and w/c with HHPT.  Pt states "whatever you think. "  Pt also reports she likes to be in control of her medical situation and gets aggravated when people go straight to her son.  Discussed with pt and notified pt that would call and discuss same options with son.  She agreed.      Exercises      General Comments General comments (skin integrity, edema, etc.):  O2 assessment: Pt was on RA at arrival with sats of 86% but wih poor pleth.  Obtained dynamap, and sats reading 93%.  Left on RA and with dynamap sats reading 93% or > when good pleth obtained.  At times, read 86% but pt moving finger and poor pleth.   Education: In regards, to d/c plans, noted in social work note that family has declined SNF.  Called after session to speak with son, [name], to discuss mobility level and home needs.  Discussed currently requiring max x 2 to stand and not able to take any steps away from bed with 2 experienced therapist.  Discussed that therapy  recommendation is still for SNF due to level of mobility.  If going home will need PTAR transport , w/c, hospital bed, max HH services, and max A of 2.  Son verbalized understanding and states will discuss with sister. Notified case Freight forwarder.     Pertinent Vitals/Pain Pain Assessment: No/denies pain    Home Living                      Prior Function            PT Goals (current goals can now be found in the care plan section) Acute Rehab PT Goals Patient Stated Goal: return home PT Goal Formulation: With patient Time For Goal Achievement: 08/05/20 Potential to Achieve Goals: Fair Progress towards PT goals: Progressing toward goals    Frequency    Min 3X/week      PT Plan Frequency needs to  be updated    Co-evaluation              AM-PAC PT "6 Clicks" Mobility   Outcome Measure  Help needed turning from your back to your side while in a flat bed without using bedrails?: Total Help needed moving from lying on your back to sitting on the side of a flat bed without using bedrails?: Total Help needed moving to and from a bed to a chair (including a wheelchair)?: Total Help needed standing up from a chair using your arms (e.g., wheelchair or bedside chair)?: Total Help needed to walk in hospital room?: Total Help needed climbing 3-5 steps with a railing? : Total 6 Click Score: 6    End of Session Equipment Utilized During Treatment: Gait belt Activity Tolerance: Patient tolerated treatment well Patient left: in bed;with call bell/phone within reach;with bed alarm set Nurse Communication: Mobility status;Need for lift equipment PT Visit Diagnosis: Unsteadiness on feet (R26.81);History of falling (Z91.81)     Time: 1350-1430 PT Time Calculation (min) (ACUTE ONLY): 40 min  Charges:  $Therapeutic Activity: 23-37 mins $Neuromuscular Re-education: 8-22 mins                     Abran Richard, PT Acute Rehab Services Pager [phone] [facility]  [place] 07/26/2020, 3:58 PM

## 2020-07-26 NOTE — Care Management Important Message (Signed)
Important Message  Patient Details IM Letter given to the Patient. Name: Alyssa Ingram MRN: [id number] Date of Birth: 06-Apr-1946   Medicare Important Message Given:  Yes     Kerin Salen 07/26/2020, 11:19 AM

## 2020-07-26 NOTE — Discharge Summary (Addendum)
Physician Discharge Summary  Alyssa Ingram INO:[id number] DOB: [birth year] DOA: 07/21/2020  PCP: [name], MD  Admit date: 07/21/2020 Discharge date: 07/27/2020  Admitted From: Home Disposition:  SNF  Recommendations for Outpatient Follow-up:  1. Follow up with PCP in 1 week 2. Follow-up with cardiology  Discharge Condition: Stable CODE STATUS: Full  Diet recommendation:  Diet Orders (From admission, onward)    Start     Ordered   07/26/20 0000  Diet - low sodium heart healthy        07/26/20 1156   07/21/20 2249  Diet heart healthy/carb modified Room service appropriate? Yes; Fluid consistency: Thin  Diet effective now       Question Answer Comment  Diet-HS Snack? Nothing   Room service appropriate? Yes   Fluid consistency: Thin      07/21/20 2248         Brief/Interim Summary: Alyssa Coxe Bostickis a 75 y.o.femalewithhistory of hypertrophic cardiomyopathy last EF measured in 2018 was 65 to 70% with grade 1 diastolic dysfunction with history of hypertension diabetes mellitus was brought to the ER after patient was found to be increasingly weak difficult to get up off the commode as per the patient's son who provided history to the ER physician. Over the last 1 week patient has been falling increasingly difficult to ambulate because of weakness finding it difficult to get up from the commode. Also has been having nonspecific pain all over. Increase urination. Denies any chest pain shortness of breath nausea vomiting or diarrhea.  Patient was admitted due to acute kidney injury, generalized weakness.  Lasix was held and patient was given IV fluid with resolution of AKI.  Patient's weakness continued to improve throughout hospital stay.  Neuropathy improved with increase in Neurontin dose.  Patient worked with PT who recommended SNF placement.  Discharge Diagnoses:  Principal Problem:   AKI (acute kidney injury) ([facility]) Active Problems:   Mild persistent asthma   GERD  (gastroesophageal reflux disease)   Hypertrophic obstructive cardiomyopathy (HCC)   Diabetes (HCC)   AKI -Baseline creatinine 0.95 -Likely in setting of dehydration, prerenal etiology, lasix use  -Resolved with IV fluid  Demand ischemia -Elevated troponin 62 --> 63  Leukocytosis -Without overt sign of infectious process, leukocytosis resolved   History of hypertrophic cardiomyopathy, chronic diastolic heart failure -Admitted with dehydration, AKI -Continue Cardizem, Lopressor (looks like Dr. [name] Breed discontinued cardizem in 2018, unclear when it was restarted)  -Echocardiogram with findings consistent with hypertrophic cardiomyopathy -Patient without any worsening signs or symptoms of decompensation at this time -Dose of Cardizem and metoprolol decreased due to bradycardia -Follow-up with cardiology as an outpatient, she has not seen Dr. [name] Breed since 2018.  I encouraged her to follow-up with him after discharge  Diabetes mellitus type 2, well controlled -Sliding scale insulin during hospitalization   LLE pain -Neuropathy? Increased Neurontin dose  -DVT US negative -Improved   Hyperlipidemia -Continue Lipitor  Generalized weakness -PT OT recommending SNF placement   Discharge Instructions  Discharge Instructions    Call MD for:  difficulty breathing, headache or visual disturbances   Complete by: As directed    Call MD for:  extreme fatigue   Complete by: As directed    Call MD for:  persistant dizziness or light-headedness   Complete by: As directed    Call MD for:  persistant nausea and vomiting   Complete by: As directed    Call MD for:  severe uncontrolled pain   Complete by: As  directed    Call MD for:  temperature >100.4   Complete by: As directed    DME Bedside commode   Complete by: As directed    Patient needs a bedside commode to treat with the following condition: Weakness   Diet - low sodium heart healthy   Complete by: As directed     Discharge instructions   Complete by: As directed    You were cared for by a hospitalist during your hospital stay. If you have any questions about your discharge medications or the care you received while you were in the hospital after you are discharged, you can call the unit and ask to speak with the hospitalist on call if the hospitalist that took care of you is not available. Once you are discharged, your primary care physician will handle any further medical issues. Please note that NO REFILLS for any discharge medications will be authorized once you are discharged, as it is imperative that you return to your primary care physician (or establish a relationship with a primary care physician if you do not have one) for your aftercare needs so that they can reassess your need for medications and monitor your lab values.   For home use only DME Hospital bed   Complete by: As directed    Length of Need: 6 Months   The above medical condition requires: Patient requires the ability to reposition frequently   Bed type: Semi-electric   Increase activity slowly   Complete by: As directed      Allergies as of 07/26/2020      Reactions   Sulfa Antibiotics Hives   Metformin And Related    Other Hives   Unknown drug   Prednisone Swelling      Medication List    STOP taking these medications   meloxicam 15 MG tablet Commonly known as: MOBIC   tiZANidine 2 MG tablet Commonly known as: ZANAFLEX     TAKE these medications   albuterol 108 (90 Base) MCG/ACT inhaler Commonly known as: ProAir HFA Inhale 2 puffs into the lungs 2 (two) times daily. What changed: additional instructions   albuterol 0.63 MG/3ML nebulizer solution Commonly known as: ACCUNEB INHALE 1 VIAL BY MOUTH VIA NEBULIZATION EVERY 6 HOURS AS NEEDED FOR WHEEZING. What changed: Another medication with the same name was changed. Make sure you understand how and when to take each.   allopurinol 100 MG tablet Commonly known as:  ZYLOPRIM Take 1 tablet (100 mg total) by mouth daily.   ammonium lactate 12 % cream Commonly known as: AMLACTIN APPLY TOPICALLY AS NEEDED FOR DRY SKIN.   aspirin 81 MG tablet Take 162 mg by mouth daily.   atorvastatin 80 MG tablet Commonly known as: LIPITOR TAKE 1 TABLET BY MOUTH EVERY DAY   cetirizine 10 MG tablet Commonly known as: ZYRTEC TAKE 1 TABLET BY MOUTH EVERY DAY AS NEEDED   diltiazem 120 MG 24 hr capsule Commonly known as: CARDIZEM CD Take 1 capsule (120 mg total) by mouth daily. What changed:   medication strength  how much to take   DULoxetine 30 MG capsule Commonly known as: Cymbalta Take 1 capsule (30 mg total) by mouth daily.   FreeStyle Libre 14 Day Sensor Misc Apply 1 Device topically every 14 (fourteen) days. E11.9   FreeStyle Libre Reader [employer] Apply 1 Device topically 4 (four) times daily as needed. E11.9   furosemide 20 MG tablet Commonly known as: LASIX TAKE 3 TABLETS (60 MG TOTAL) BY  MOUTH 2 TIMES DAILY.   gabapentin 100 MG capsule Commonly known as: NEURONTIN Take 2 capsules (200 mg total) by mouth 2 (two) times daily. What changed:   medication strength  See the new instructions.   glipiZIDE 2.5 MG 24 hr tablet Commonly known as: GLUCOTROL XL TAKE 1 TABLET (2.5 MG TOTAL) BY MOUTH DAILY WITH BREAKFAST.   Lancets Misc Use as directed three times daily E11.9   lidocaine 5 % Commonly known as: Lidoderm Place 1 patch onto the skin daily. Remove & Discard patch within 12 hours or as directed by MD   meclizine 25 MG tablet Commonly known as: ANTIVERT TAKE 1 TABLET BY MOUTH THREE TIMES A DAY What changed:   how much to take  how to take this  when to take this  additional instructions   metoprolol tartrate 25 MG tablet Commonly known as: LOPRESSOR Take 1 tablet (25 mg total) by mouth 2 (two) times daily. What changed:   medication strength  how much to take   ONE TOUCH ULTRA 2 w/Device Kit Use as directed three times  daily E11.9   OneTouch Ultra test strip Generic drug: glucose blood Use as instructed three times daily E11.9   pantoprazole 40 MG tablet Commonly known as: PROTONIX Take 1 tablet (40 mg total) by mouth daily.   triamcinolone 55 MCG/ACT Aero nasal inhaler Commonly known as: Nasacort AQ Place 2 sprays into the nose daily.            Durable Medical Equipment  (From admission, onward)         Start     Ordered   07/26/20 0000  DME Bedside commode       Question:  Patient needs a bedside commode to treat with the following condition  Answer:  Weakness   07/26/20 1156   07/26/20 0000  For home use only DME Hospital bed       Question Answer Comment  Length of Need 6 Months   The above medical condition requires: Patient requires the ability to reposition frequently   Bed type Semi-electric      07/26/20 1156          Follow-up Information    [name], MD. Schedule an appointment as soon as possible for a visit in 1 week(s).   Specialties: Internal Medicine, Radiology Contact information: [place] [address] [phone]        Lelon Perla, MD. Call.   Specialty: Cardiology Why: Call to make follow up appointment in 1-2 weeks  Contact information: [address] [place] [address] [phone]              Allergies  Allergen Reactions  . Sulfa Antibiotics Hives  . Metformin And Related   . Other Hives    Unknown drug  . Prednisone Swelling    Consultations:  None    Procedures/Studies: DG Chest Port 1 View  Result Date: 07/21/2020 CLINICAL DATA:  Shortness of breath EXAM: PORTABLE CHEST 1 VIEW COMPARISON:  12/09/2015 FINDINGS: Cardiac shadow is enlarged but stable. Mild aortic calcifications are seen. The lungs are clear. Mild central vascular congestion is noted without edema. No bony abnormality is seen. IMPRESSION: Mild central vascular congestion without significant edema. Electronically  Signed   By: Inez Catalina M.D.   On: 07/21/2020 18:12   ECHOCARDIOGRAM COMPLETE  Result Date: 07/24/2020    ECHOCARDIOGRAM REPORT   Patient Name:   YOALI CONRY Date  of Exam: 07/24/2020 Medical Rec #:  [id number]        Height:       61.0 in Accession #:    [id number]       Weight:       189.6 lb Date of Birth:  1945-08-06        BSA:          1.846 m Patient Age:    38 years         BP:           147/74 mmHg Patient Gender: F                HR:           64 bpm. Exam Location:  Inpatient Procedure: 2D Echo, 3D Echo, Cardiac Doppler and Color Doppler Indications:    Hypertrophic cardiomyopathy.; I42.9 Cardiomyopathy (unspecified)  History:        Patient has prior history of Echocardiogram examinations, most                 recent 10/06/2016. CHF, Cardiomyopathy and Hypertrophic                 Cardiomyopathy, Signs/Symptoms:Dyspnea and Shortness of Breath;                 Risk Factors:Hypertension, Dyslipidemia and Diabetes. Cancer.  Sonographer:    Roseanna Rainbow RDCS Referring Phys: [id number] [facility]  Sonographer Comments: Technically difficult study due to poor echo windows. Extremely difficult exam due to patient bed. Patient positioned up against the side of the bed. Patient having leg pain. IMPRESSIONS  1. Severe asymmetric hypertrophy of the basal septum up to 2.4 cm. There is SAM of the MV with LVOT obstruction up to 42-47 mmHG. The MR is central and moderate, rather than posteriorly directed. This is due to significant posterior MAC. Findings are consistent with hypertrophic cardiomyopathy which is known . Left ventricular ejection fraction, by estimation, is 70 to 75%. The left ventricle has hyperdynamic function. The left ventricle has no regional wall motion abnormalities. There is severe asymmetric left ventricular hypertrophy of the basal-septal segment. Left ventricular diastolic function could not be evaluated.  2. Right ventricular systolic function is normal. The right ventricular size is  normal. There is moderately elevated pulmonary artery systolic pressure. The estimated right ventricular systolic pressure is 44.0 mmHg.  3. Left atrial size was severely dilated.  4. Mild to moderate calcific mitral stenosis. MG 4 mmHG @ 64 bpm. MVA by VTI 1.1 cm2. DVI 0.48. The mitral valve is degenerative. Mild to moderate mitral valve regurgitation. Moderate mitral stenosis. The mean mitral valve gradient is 4.0 mmHg with average heart rate of 64 bpm. Severe mitral annular calcification.  5. The aortic valve is tricuspid. There is mild calcification of the aortic valve. There is mild thickening of the aortic valve. Aortic valve regurgitation is not visualized. Mild aortic valve sclerosis is present, with no evidence of aortic valve stenosis.  6. The inferior vena cava is dilated in size with >50% respiratory variability, suggesting right atrial pressure of 8 mmHg. FINDINGS  Left Ventricle: Severe asymmetric hypertrophy of the basal septum up to 2.4 cm. There is SAM of the MV with LVOT obstruction up to 42-47 mmHG. The MR is central and moderate, rather than posteriorly directed. This is due to significant posterior MAC. Findings are consistent with hypertrophic cardiomyopathy which is known. Left ventricular ejection fraction, by estimation, is 70 to 75%. The left ventricle  has hyperdynamic function. The left ventricle has no regional wall motion abnormalities. The left  ventricular internal cavity size was small. There is severe asymmetric left ventricular hypertrophy of the basal-septal segment. Left ventricular diastolic function could not be evaluated due to mitral annular calcification (moderate or greater). Left ventricular diastolic function could not be evaluated. Right Ventricle: The right ventricular size is normal. No increase in right ventricular wall thickness. Right ventricular systolic function is normal. There is moderately elevated pulmonary artery systolic pressure. The tricuspid regurgitant  velocity is 3.67 m/s, and with an assumed right atrial pressure of 8 mmHg, the estimated right ventricular systolic pressure is 31.5 mmHg. Left Atrium: Left atrial size was severely dilated. Right Atrium: Right atrial size was normal in size. Pericardium: Trivial pericardial effusion is present. Mitral Valve: Mild to moderate calcific mitral stenosis. MG 4 mmHG @ 64 bpm. MVA by VTI 1.1 cm2. DVI 0.48. The mitral valve is degenerative in appearance. Severe mitral annular calcification. Mild to moderate mitral valve regurgitation. Moderate mitral valve stenosis. MV peak gradient, 15.8 mmHg. The mean mitral valve gradient is 4.0 mmHg with average heart rate of 64 bpm. Tricuspid Valve: The tricuspid valve is grossly normal. Tricuspid valve regurgitation is mild . No evidence of tricuspid stenosis. Aortic Valve: The aortic valve is tricuspid. There is mild calcification of the aortic valve. There is mild thickening of the aortic valve. Aortic valve regurgitation is not visualized. Mild aortic valve sclerosis is present, with no evidence of aortic valve stenosis. Aortic valve mean gradient measures 16.5 mmHg. Aortic valve peak gradient measures 25.3 mmHg. Aortic valve area, by VTI measures 1.57 cm. Pulmonic Valve: The pulmonic valve was grossly normal. Pulmonic valve regurgitation is trivial. No evidence of pulmonic stenosis. Aorta: The aortic root and ascending aorta are structurally normal, with no evidence of dilitation. Venous: The inferior vena cava is dilated in size with greater than 50% respiratory variability, suggesting right atrial pressure of 8 mmHg. IAS/Shunts: The atrial septum is grossly normal.  LEFT VENTRICLE PLAX 2D LVIDd:         3.30 cm     Diastology LVIDs:         1.20 cm     LV e' medial:    4.24 cm/s LV PW:         1.18 cm     LV E/e' medial:  38.9 LV IVS:        2.40 cm     LV e' lateral:   4.68 cm/s LVOT diam:     1.70 cm     LV E/e' lateral: 35.3 LV SV:         68 LV SV Index:   37 LVOT Area:      2.27 cm  LV Volumes (MOD) LV vol d, MOD A2C: 43.4 ml LV vol d, MOD A4C: 83.9 ml LV vol s, MOD A2C: 14.3 ml LV vol s, MOD A4C: 18.7 ml LV SV MOD A2C:     29.1 ml LV SV MOD A4C:     83.9 ml LV SV MOD BP:      47.2 ml RIGHT VENTRICLE             IVC RV S prime:     14.70 cm/s  IVC diam: 2.40 cm TAPSE (M-mode): 1.9 cm LEFT ATRIUM              Index       RIGHT ATRIUM  Index LA diam:        5.20 cm  2.82 cm/m  RA Area:     12.90 cm LA Vol (A2C):   116.0 ml 62.83 ml/m RA Volume:   29.10 ml  15.76 ml/m LA Vol (A4C):   125.0 ml 67.70 ml/m LA Biplane Vol: 121.0 ml 65.53 ml/m  AORTIC VALVE                    PULMONIC VALVE AV Area (Vmax):    1.59 cm     PV Vmax:          1.93 m/s AV Area (Vmean):   1.61 cm     PV Peak grad:     14.9 mmHg AV Area (VTI):     1.57 cm     PR End Diast Vel: 1.97 msec AV Vmax:           251.50 cm/s AV Vmean:          194.500 cm/s AV VTI:            0.436 m AV Peak Grad:      25.3 mmHg AV Mean Grad:      16.5 mmHg LVOT Vmax:         176.00 cm/s LVOT Vmean:        138.000 cm/s LVOT VTI:          0.301 m LVOT/AV VTI ratio: 0.69  AORTA Ao Root diam: 3.20 cm MITRAL VALVE                 TRICUSPID VALVE MV Area (PHT): 1.86 cm      TR Peak grad:   53.9 mmHg MV Area VTI:   1.11 cm      TR Vmax:        367.00 cm/s MV Peak grad:  15.8 mmHg MV Mean grad:  4.0 mmHg      SHUNTS MV Vmax:       1.99 m/s      Systemic VTI:  0.30 m MV Vmean:      88.4 cm/s     Systemic Diam: 1.70 cm MV VTI:        0.62 m MV Decel Time: 408 msec MR Peak grad:    130.4 mmHg MR Mean grad:    91.0 mmHg MR Vmax:         571.00 cm/s MR Vmean:        457.0 cm/s MR PISA:         0.57 cm MR PISA Eff ROA: 3 mm MR PISA Radius:  0.30 cm MV E velocity: 165.00 cm/s MV A velocity: 142.00 cm/s MV E/A ratio:  1.16 Eleonore Chiquito MD Electronically signed by Eleonore Chiquito MD Signature Date/Time: 07/24/2020/1:37:54 PM    Final    VAS [place] LOWER EXTREMITY VENOUS (DVT)  Result Date: 07/25/2020  Lower Venous DVT Study Indications:  Edema.  Risk Factors: None identified. Limitations: Body habitus, poor ultrasound/tissue interface and patient positioning, patient pain tolerance, calf induration. Comparison Study: No prior studies. Performing Technologist: Oliver Hum RVT  Examination Guidelines: A complete evaluation includes B-mode imaging, spectral Doppler, color Doppler, and power Doppler as needed of all accessible portions of each vessel. Bilateral testing is considered an integral part of a complete examination. Limited examinations for reoccurring indications may be performed as noted. The reflux portion of the exam is performed with the patient in reverse Trendelenburg.  +---------+---------------+---------+-----------+----------+-------------------+ RIGHT    CompressibilityPhasicitySpontaneityPropertiesThrombus Aging      +---------+---------------+---------+-----------+----------+-------------------+  CFV      Full           Yes      Yes                                      +---------+---------------+---------+-----------+----------+-------------------+ SFJ      Full                                                             +---------+---------------+---------+-----------+----------+-------------------+ FV Prox  Full                                                             +---------+---------------+---------+-----------+----------+-------------------+ FV Mid   Full                                                             +---------+---------------+---------+-----------+----------+-------------------+ FV DistalFull                                                             +---------+---------------+---------+-----------+----------+-------------------+ PFV      Full                                                             +---------+---------------+---------+-----------+----------+-------------------+ POP      Full           Yes      Yes                                       +---------+---------------+---------+-----------+----------+-------------------+ PTV      Full                                                             +---------+---------------+---------+-----------+----------+-------------------+ PERO                                                  Not well visualized +---------+---------------+---------+-----------+----------+-------------------+   +---------+---------------+---------+-----------+----------+-------------------+ LEFT     CompressibilityPhasicitySpontaneityPropertiesThrombus Aging      +---------+---------------+---------+-----------+----------+-------------------+ CFV      Full  Yes      Yes                                      +---------+---------------+---------+-----------+----------+-------------------+ SFJ      Full                                                             +---------+---------------+---------+-----------+----------+-------------------+ FV Prox  Full                                                             +---------+---------------+---------+-----------+----------+-------------------+ FV Mid   Full                                                             +---------+---------------+---------+-----------+----------+-------------------+ FV Distal               Yes      Yes                                      +---------+---------------+---------+-----------+----------+-------------------+ PFV      Full                                                             +---------+---------------+---------+-----------+----------+-------------------+ POP      Full           Yes      Yes                                      +---------+---------------+---------+-----------+----------+-------------------+ PTV                                                   Not well visualized  +---------+---------------+---------+-----------+----------+-------------------+ PERO                                                  Not well visualized +---------+---------------+---------+-----------+----------+-------------------+    Summary: RIGHT: - There is no evidence of deep vein thrombosis in the lower extremity. However, portions of this examination were limited- see technologist comments above.  - No cystic structure found in the popliteal fossa.  LEFT: - There is no evidence of deep vein thrombosis in the lower extremity. However,  portions of this examination were limited- see technologist comments above.  - No cystic structure found in the popliteal fossa.  *See table(s) above for measurements and observations. Electronically signed by Harold Barban MD on 07/25/2020 at 9:09:25 PM.    Final       Discharge Exam: Vitals:   07/26/20 0805 07/26/20 0929  BP:  140/83  Pulse:  (!) 55  Resp:    Temp:    SpO2: 94%     General: Pt is alert, awake, not in acute distress Cardiovascular: Bradycardic, regular rhythm, S1/S2 +, no edema Respiratory: CTA bilaterally, no wheezing, no rhonchi, no respiratory distress, no conversational dyspnea  Abdominal: Soft, NT, ND, bowel sounds + Extremities: no edema, no cyanosis Psych: Normal mood and affect, stable judgement and insight     The results of significant diagnostics from this hospitalization (including imaging, microbiology, ancillary and laboratory) are listed below for reference.     Microbiology: Recent Results (from the past 240 hour(s))  Resp Panel by RT-PCR (Flu A&B, Covid) Nasopharyngeal Swab     Status: None   Collection Time: 07/21/20 10:14 PM   Specimen: Nasopharyngeal Swab; Nasopharyngeal(NP) swabs in vial transport medium  Result Value Ref Range Status   SARS Coronavirus 2 by RT PCR NEGATIVE NEGATIVE Final    Comment: (NOTE) SARS-CoV-2 target nucleic acids are NOT DETECTED.  The SARS-CoV-2 RNA is generally  detectable in upper respiratory specimens during the acute phase of infection. The lowest concentration of SARS-CoV-2 viral copies this assay can detect is 138 copies/mL. A negative result does not preclude SARS-Cov-2 infection and should not be used as the sole basis for treatment or other patient management decisions. A negative result may occur with  improper specimen collection/handling, submission of specimen other than nasopharyngeal swab, presence of viral mutation(s) within the areas targeted by this assay, and inadequate number of viral copies(<138 copies/mL). A negative result must be combined with clinical observations, patient history, and epidemiological information. The expected result is Negative.  Fact Sheet for Patients:  EntrepreneurPulse.com.au  Fact Sheet for Healthcare Providers:  IncredibleEmployment.be  This test is no t yet approved or cleared by the [place] FDA and  has been authorized for detection and/or diagnosis of SARS-CoV-2 by FDA under an Emergency Use Authorization (EUA). This EUA will remain  in effect (meaning this test can be used) for the duration of the COVID-19 declaration under Section 564(b)(1) of the Act, 21 U.S.C.section 360bbb-3(b)(1), unless the authorization is terminated  or revoked sooner.       Influenza A by PCR NEGATIVE NEGATIVE Final   Influenza B by PCR NEGATIVE NEGATIVE Final    Comment: (NOTE) The Xpert Xpress SARS-CoV-2/FLU/RSV plus assay is intended as an aid in the diagnosis of influenza from Nasopharyngeal swab specimens and should not be used as a sole basis for treatment. Nasal washings and aspirates are unacceptable for Xpert Xpress SARS-CoV-2/FLU/RSV testing.  Fact Sheet for Patients: EntrepreneurPulse.com.au  Fact Sheet for Healthcare Providers: IncredibleEmployment.be  This test is not yet approved or cleared by the [place] FDA  and has been authorized for detection and/or diagnosis of SARS-CoV-2 by FDA under an Emergency Use Authorization (EUA). This EUA will remain in effect (meaning this test can be used) for the duration of the COVID-19 declaration under Section 564(b)(1) of the Act, 21 U.S.C. section 360bbb-3(b)(1), unless the authorization is terminated or revoked.  Performed at [facility], [place] [address] [place], [place] [address]      Labs:  BNP (last 3 results) No results for input(s): BNP in the last 8760 hours. Basic Metabolic Panel: Recent Labs  Lab 07/21/20 1738 07/22/20 0404 07/23/20 0405 07/24/20 0442  NA 140 140 142 143  K 5.1 3.1* 3.4* 3.8  CL 99 101 105 108  CO2 _0 GLUCOSE 134* 120* 82 93  BUN 27* 32* 24* 16  CREATININE 1.68* 1.36* 0.91 0.80  CALCIUM 8.6* 8.1* 8.2* 8.4*  MG  --   --  2.0 2.0   Liver Function Tests: Recent Labs  Lab 07/21/20 1738  AST 56*  ALT 30  ALKPHOS 65  BILITOT 1.9*  PROT 7.7  ALBUMIN 3.2*   No results for input(s): LIPASE, AMYLASE in the last 168 hours. No results for input(s): AMMONIA in the last 168 hours. CBC: Recent Labs  Lab 07/21/20 1738 07/22/20 0404 07/23/20 0405  WBC 12.5* 10.7* 9.7  NEUTROABS 10.6*  --   --   HGB 13.6 11.4* 10.8*  HCT 43.1 36.0 34.4*  MCV 87.2 87.6 87.5  PLT 205 194 189   Cardiac Enzymes: Recent Labs  Lab 07/22/20 0626  CKTOTAL 271*   BNP: Invalid input(s): POCBNP CBG: Recent Labs  Lab 07/25/20 1214 07/25/20 1631 07/25/20 2043 07/26/20 0751 07/26/20 1123  GLUCAP 135* 129* 132* 111* 119*   D-Dimer No results for input(s): DDIMER in the last 72 hours. Hgb A1c No results for input(s): HGBA1C in the last 72 hours. Lipid Profile No results for input(s): CHOL, HDL, LDLCALC, TRIG, CHOLHDL, LDLDIRECT in the last 72 hours. Thyroid function studies No results for input(s): TSH, T4TOTAL, T3FREE, THYROIDAB in the last 72 hours.  Invalid input(s): FREET3 Anemia work  up No results for input(s): VITAMINB12, FOLATE, FERRITIN, TIBC, IRON, RETICCTPCT in the last 72 hours. Urinalysis    Component Value Date/Time   COLORURINE AMBER (A) 07/21/2020 2006   APPEARANCEUR CLOUDY (A) 07/21/2020 2006   LABSPEC 1.018 07/21/2020 2006   PHURINE 5.0 07/21/2020 2006   GLUCOSEU NEGATIVE 07/21/2020 2006   GLUCOSEU NEGATIVE 06/20/2017 1543   HGBUR NEGATIVE 07/21/2020 2006   BILIRUBINUR SMALL (A) 07/21/2020 2006   KETONESUR 5 (A) 07/21/2020 2006   PROTEINUR 30 (A) 07/21/2020 2006   UROBILINOGEN 1.0 06/20/2017 1543   NITRITE NEGATIVE 07/21/2020 2006   LEUKOCYTESUR NEGATIVE 07/21/2020 2006   Sepsis Labs Invalid input(s): PROCALCITONIN,  WBC,  LACTICIDVEN Microbiology Recent Results (from the past 240 hour(s))  Resp Panel by RT-PCR (Flu A&B, Covid) Nasopharyngeal Swab     Status: None   Collection Time: 07/21/20 10:14 PM   Specimen: Nasopharyngeal Swab; Nasopharyngeal(NP) swabs in vial transport medium  Result Value Ref Range Status   SARS Coronavirus 2 by RT PCR NEGATIVE NEGATIVE Final    Comment: (NOTE) SARS-CoV-2 target nucleic acids are NOT DETECTED.  The SARS-CoV-2 RNA is generally detectable in upper respiratory specimens during the acute phase of infection. The lowest concentration of SARS-CoV-2 viral copies this assay can detect is 138 copies/mL. A negative result does not preclude SARS-Cov-2 infection and should not be used as the sole basis for treatment or other patient management decisions. A negative result may occur with  improper specimen collection/handling, submission of specimen other than nasopharyngeal swab, presence of viral mutation(s) within the areas targeted by this assay, and inadequate number of viral copies(<138 copies/mL). A negative result must be combined with clinical observations, patient history, and epidemiological information. The expected result is Negative.  Fact Sheet for Patients:   EntrepreneurPulse.com.au  Fact Sheet for Healthcare  Providers:  IncredibleEmployment.be  This test is no t yet approved or cleared by the [place] and  has been authorized for detection and/or diagnosis of SARS-CoV-2 by FDA under an Emergency Use Authorization (EUA). This EUA will remain  in effect (meaning this test can be used) for the duration of the COVID-19 declaration under Section 564(b)(1) of the Act, 21 U.S.C.section 360bbb-3(b)(1), unless the authorization is terminated  or revoked sooner.       Influenza A by PCR NEGATIVE NEGATIVE Final   Influenza B by PCR NEGATIVE NEGATIVE Final    Comment: (NOTE) The Xpert Xpress SARS-CoV-2/FLU/RSV plus assay is intended as an aid in the diagnosis of influenza from Nasopharyngeal swab specimens and should not be used as a sole basis for treatment. Nasal washings and aspirates are unacceptable for Xpert Xpress SARS-CoV-2/FLU/RSV testing.  Fact Sheet for Patients: EntrepreneurPulse.com.au  Fact Sheet for Healthcare Providers: IncredibleEmployment.be  This test is not yet approved or cleared by the [place] FDA and has been authorized for detection and/or diagnosis of SARS-CoV-2 by FDA under an Emergency Use Authorization (EUA). This EUA will remain in effect (meaning this test can be used) for the duration of the COVID-19 declaration under Section 564(b)(1) of the Act, 21 U.S.C. section 360bbb-3(b)(1), unless the authorization is terminated or revoked.  Performed at [facility], [place] [address], [place], [place] [address]      Patient was seen and examined on the day of discharge and was found to be in stable condition. Time coordinating discharge: 35 minutes including assessment and coordination of care, as well as examination of the patient.   SIGNED:  Dessa Phi, DO [facility] 07/26/2020, 12:04 PM

## 2020-07-26 NOTE — TOC Transition Note (Signed)
Transition of Care [facility]) - CM/SW Discharge Note   Patient Details  Name: Alyssa Ingram MRN: [id number] Date of Birth: August 06, 1945  Transition of Care [facility]) CM/SW Contact:  Ross Ludwig, LCSW Phone Number: 07/26/2020, 12:23 PM   Clinical Narrative:     CSW spoke patient's son [name] and daughter Sherlyn Hay to find out if they decided on a SNF.  Patient's family have decided they do not want her to go to a SNF, they would rather go home with home health.  CSW reiterated that home health will not be there every day, and she will not get as much therapy as she would at a SNF, but family expressed they would rather her return home with home health.  CSW provided choice of agency, and they did not have a preference, CSW was able to contact [facility] who is agreeable to accept patient.  CSW offered patient's family if they needed PTAR to get back home, patient's son stated they do not. Patient has a Corporate investment banker which they use as a wheelchair when patient is weak, they do not need a walker.  Family is requesting a purwick, CSW referred them to the www.[email or web address].[email or web address] website to look at ordering.  CSW was also asked if insurance pays for chucks, CSW referred to them to [place] or the pharmacy.  CSW was also asked about diabetic testing sets that are needle less, CSW informed patient's family to check with her pharmacy to see if she qualifies.  CSW contacted [facility] for a bedside commode and a hospital bed, per patient's son, they would like to be called and make arrangements for a hospital bed to be delivered.  CSW updated patient's son, CSW to sign off, please reconsult if social work needs arise.     Final next level of care: [place] Barriers to Discharge: Barriers Resolved   Patient Goals and CMS Choice Patient states their goals for this hospitalization and ongoing recovery are:: To return back home with home health. CMS Medicare.gov Compare Post Acute Care list provided to::  Patient Represenative (must comment) Choice offered to / list presented to : Adult Children  Discharge Placement PASRR number recieved: 07/23/20                     Discharge Plan and Services     Post Acute Care Choice: [place]          DME Arranged: Bedside commode,Hospital bed DME Agency: [facility] Date DME Agency Contacted: 07/26/20 Time DME Agency Contacted: 1200 Representative spoke with at DME Agency: Freda Munro [place]: OT,PT [place]: [place] Date [place]: 07/26/20 Time [place]: 1222 Representative spoke with at [place]: [place] Determinants of Health ([place]) Interventions     Readmission Risk Interventions No flowsheet data found.

## 2020-07-26 NOTE — TOC Progression Note (Addendum)
Transition of Care [facility]) - Progression Note    Patient Details  Name: Alyssa Ingram MRN: [id number] Date of Birth: 1946-05-07  Transition of Care [facility]) CM/SW Contact  Preciliano Castell, [place], [place] Phone Number: 07/26/2020, 5:38 PM  Clinical Narrative:     CSW spoke to patient and her son, they have changed their mind in regards to having patient go home with home health now based on the latest note from PT.  CSW discussed bed offers, and patient decided to choose [facility].  CSW contacted [facility] and they can accept patient once insurance authorization has been approved, and Covid test is back.  CSW contacted patient's insurance company to start insurance authorization, reference number is [id number].  CSW also notified bedside nurse and physician to request a covid test.  CSW to continue to follow patient's progress throughout discharge planning.  CSW also updated [facility] that patient is going to a SNF now.    Expected Discharge Plan: Skilled Nursing Facility Barriers to Discharge: Barriers Resolved  Expected Discharge Plan and Services Expected Discharge Plan: [place] Choice: [place] Living arrangements for the past 2 months: Single Family Home Expected Discharge Date: 07/27/20               DME Arranged: Bedside commode,Hospital bed DME Agency: [facility] Date DME Agency Contacted: 07/26/20 Time DME Agency Contacted: 1200 Representative spoke with at DME Agency: Freda Munro [place]: OT,PT [place] Date [place]: 07/26/20 Time [employer]: 1222 Representative spoke with at [place]: [place] Determinants of Health ([place]) Interventions    Readmission Risk Interventions No flowsheet data found.

## 2020-07-27 DIAGNOSIS — I421 Obstructive hypertrophic cardiomyopathy: Secondary | ICD-10-CM | POA: Diagnosis not present

## 2020-07-27 DIAGNOSIS — M79644 Pain in right finger(s): Secondary | ICD-10-CM | POA: Diagnosis not present

## 2020-07-27 DIAGNOSIS — M25562 Pain in left knee: Secondary | ICD-10-CM | POA: Diagnosis not present

## 2020-07-27 DIAGNOSIS — M13862 Other specified arthritis, left knee: Secondary | ICD-10-CM | POA: Diagnosis not present

## 2020-07-27 DIAGNOSIS — K5904 Chronic idiopathic constipation: Secondary | ICD-10-CM | POA: Diagnosis not present

## 2020-07-27 DIAGNOSIS — F32A Depression, unspecified: Secondary | ICD-10-CM | POA: Diagnosis not present

## 2020-07-27 DIAGNOSIS — K219 Gastro-esophageal reflux disease without esophagitis: Secondary | ICD-10-CM | POA: Diagnosis not present

## 2020-07-27 DIAGNOSIS — E119 Type 2 diabetes mellitus without complications: Secondary | ICD-10-CM | POA: Diagnosis not present

## 2020-07-27 DIAGNOSIS — M109 Gout, unspecified: Secondary | ICD-10-CM | POA: Diagnosis not present

## 2020-07-27 DIAGNOSIS — M6281 Muscle weakness (generalized): Secondary | ICD-10-CM | POA: Diagnosis not present

## 2020-07-27 DIAGNOSIS — E1169 Type 2 diabetes mellitus with other specified complication: Secondary | ICD-10-CM | POA: Diagnosis not present

## 2020-07-27 DIAGNOSIS — I517 Cardiomegaly: Secondary | ICD-10-CM | POA: Diagnosis not present

## 2020-07-27 DIAGNOSIS — N179 Acute kidney failure, unspecified: Secondary | ICD-10-CM | POA: Diagnosis not present

## 2020-07-27 DIAGNOSIS — E785 Hyperlipidemia, unspecified: Secondary | ICD-10-CM | POA: Diagnosis not present

## 2020-07-27 DIAGNOSIS — R140 Abdominal distension (gaseous): Secondary | ICD-10-CM | POA: Diagnosis not present

## 2020-07-27 DIAGNOSIS — M79672 Pain in left foot: Secondary | ICD-10-CM | POA: Diagnosis not present

## 2020-07-27 DIAGNOSIS — Z9981 Dependence on supplemental oxygen: Secondary | ICD-10-CM | POA: Diagnosis not present

## 2020-07-27 DIAGNOSIS — I10 Essential (primary) hypertension: Secondary | ICD-10-CM | POA: Diagnosis not present

## 2020-07-27 DIAGNOSIS — J4530 Mild persistent asthma, uncomplicated: Secondary | ICD-10-CM | POA: Diagnosis not present

## 2020-07-27 DIAGNOSIS — G629 Polyneuropathy, unspecified: Secondary | ICD-10-CM | POA: Diagnosis not present

## 2020-07-27 DIAGNOSIS — R1084 Generalized abdominal pain: Secondary | ICD-10-CM | POA: Diagnosis not present

## 2020-07-27 DIAGNOSIS — M79671 Pain in right foot: Secondary | ICD-10-CM | POA: Diagnosis not present

## 2020-07-27 DIAGNOSIS — R5381 Other malaise: Secondary | ICD-10-CM | POA: Diagnosis not present

## 2020-07-27 DIAGNOSIS — Z743 Need for continuous supervision: Secondary | ICD-10-CM | POA: Diagnosis not present

## 2020-07-27 DIAGNOSIS — R141 Gas pain: Secondary | ICD-10-CM | POA: Diagnosis not present

## 2020-07-27 DIAGNOSIS — R279 Unspecified lack of coordination: Secondary | ICD-10-CM | POA: Diagnosis not present

## 2020-07-27 DIAGNOSIS — R262 Difficulty in walking, not elsewhere classified: Secondary | ICD-10-CM | POA: Diagnosis not present

## 2020-07-27 DIAGNOSIS — R531 Weakness: Secondary | ICD-10-CM | POA: Diagnosis not present

## 2020-07-27 DIAGNOSIS — I5032 Chronic diastolic (congestive) heart failure: Secondary | ICD-10-CM | POA: Diagnosis not present

## 2020-07-27 LAB — SARS CORONAVIRUS 2 (TAT 6-24 HRS): SARS Coronavirus 2: NEGATIVE

## 2020-07-27 LAB — GLUCOSE, CAPILLARY
Glucose-Capillary: 90 mg/dL (ref 70–99)
Glucose-Capillary: 118 mg/dL — ABNORMAL HIGH (ref 70–99)

## 2020-07-27 NOTE — Plan of Care (Signed)

## 2020-07-27 NOTE — Progress Notes (Signed)
Pt. Discharged via stretcher, EMS came to transport pt. to SNF. Pt. Is alert and oriented, not on any distress. Son [name]) was called and updated regarding the transfer. All personal belongings are with the pt.

## 2020-07-27 NOTE — TOC Transition Note (Signed)
Transition of Care [facility]) - CM/SW Discharge Note   Patient Details  Name: Alyssa Ingram MRN: [id number] Date of Birth: February 14, 1946  Transition of Care [facility]) CM/SW Contact:  Ross Ludwig, LCSW Phone Number: 07/27/2020, 11:50 AM   Clinical Narrative:     Patient to be d/c'ed today [facility].  Patient and family agreeable to plans will transport via ems RN to call report to [phone].  CSW notified patient's son [name] who is aware that patient is discharging today.  Patient's son [name] would like a phone call once EMS arrives, [phone].  Final next level of care: Skilled Nursing Facility Barriers to Discharge: Barriers Resolved   Patient Goals and CMS Choice Patient states their goals for this hospitalization and ongoing recovery are:: To go to SNF then return back home after rehab. CMS Medicare.gov Compare Post Acute Care list provided to:: Patient Choice offered to / list presented to : [place]  Discharge Placement PASRR number recieved: 07/23/20            Patient [facility] bed at: [facility] Patient to be transferred to facility by: [facility] Name of family member notified: Patient's son [name] Patient and family notified of of transfer: 07/27/20  Discharge Plan and Services     Post Acute Care Choice: [place]          DME Arranged: Bedside commode,Hospital bed DME Agency: [facility] Date DME Agency Contacted: 07/26/20 Time DME Agency Contacted: [id number] Representative spoke with at DME Agency: Freda Munro HH Arranged: OT,PT [place] Date [place]: 07/26/20 Time [place]: 1222 Representative spoke with at [place]: [place] Determinants of Health ([place]) Interventions     Readmission Risk Interventions No flowsheet data found.

## 2020-07-27 NOTE — Progress Notes (Signed)
  PROGRESS NOTE  Patient initially recommended for SNF and was agreeable. Then decided wanted to go home. Discharge ready yesterday, but then she changed her mind and wanted to go to SNF as previously recommended. Patient without new complaints this morning, had a restless night but overall feeling much better than when she came in. Patient medically stable for discharge to SNF today.   Dessa Phi, DO [facility] 07/27/2020, 7:56 AM  Available via Epic secure chat 7am-7pm After these hours, please refer to coverage provider listed on [email or web address]

## 2020-07-27 NOTE — Progress Notes (Signed)
Report called @ 1230hr to [facility],  at [employer].

## 2020-07-29 ENCOUNTER — Telehealth: Payer: Self-pay | Admitting: Internal Medicine

## 2020-07-29 NOTE — Telephone Encounter (Signed)
   Patient's son Alyssa Ingram calling, would like to know if patient still needs to be taking gabapentin (NEURONTIN) 100 MG capsule  He states the patient is at a SNF and there is discussion about if patient should be taking med. He has a conference call this morning with nursing staff at rehab and would like Dr Judi Cong input.  Please call patient/[name] [phone]

## 2020-07-29 NOTE — Telephone Encounter (Signed)
Alyssa Ingram has been notified the patient should still be taking neurontin.

## 2020-07-29 NOTE — Telephone Encounter (Signed)
Yes I believe she should still take, as she was following with dr Tamala Julian [facility] as recent as dec 2021 and being considered for referral to pain management as her maximal meds incluiding neurontin was not always effective at controlling the pain  Her most recent MRI MRI lumbar 2013 IMPRESSION:  1.  Signal changes around the L5-S1 disc space suspicious for early  infectious diskitis osteomyelitis.  Correlation with hematologic  markers of inflammation/infection is recommended.  It is possible  that these changes represent severe sterile reaction to the  advanced L5-S1 disc degeneration. If the clinical course is  equivocal, repeat short interval repeat lumbar MRI (e.g. 2 weeks)  may be valuable.  There is also chronic spondylolisthesis and facet  arthropathy at this level.  2.  Abnormal bone marrow signal is nonspecific and could reflect  benign or malignant etiology (myelodysplasia, other chronic  disease, less likely multiple myeloma).  3.  Spondylolisthesis at L4-L5 also associated with disc and facet  degeneration.  Subsequent moderate spinal stenosis with bilateral  lateral recess stenosis and moderate to severe bilateral L4  foraminal stenosis.  4.  Multifactorial L3-L4 mild to moderate spinal and bilateral L3  foraminal stenosis.

## 2020-08-03 DIAGNOSIS — M6281 Muscle weakness (generalized): Secondary | ICD-10-CM | POA: Diagnosis not present

## 2020-08-03 DIAGNOSIS — K5904 Chronic idiopathic constipation: Secondary | ICD-10-CM | POA: Diagnosis not present

## 2020-08-03 DIAGNOSIS — M79672 Pain in left foot: Secondary | ICD-10-CM | POA: Diagnosis not present

## 2020-08-03 DIAGNOSIS — R262 Difficulty in walking, not elsewhere classified: Secondary | ICD-10-CM | POA: Diagnosis not present

## 2020-08-03 DIAGNOSIS — E119 Type 2 diabetes mellitus without complications: Secondary | ICD-10-CM | POA: Diagnosis not present

## 2020-08-03 DIAGNOSIS — M79671 Pain in right foot: Secondary | ICD-10-CM | POA: Diagnosis not present

## 2020-08-05 DIAGNOSIS — E119 Type 2 diabetes mellitus without complications: Secondary | ICD-10-CM | POA: Diagnosis not present

## 2020-08-05 DIAGNOSIS — R531 Weakness: Secondary | ICD-10-CM | POA: Diagnosis not present

## 2020-08-05 DIAGNOSIS — I517 Cardiomegaly: Secondary | ICD-10-CM | POA: Diagnosis not present

## 2020-08-05 DIAGNOSIS — J4530 Mild persistent asthma, uncomplicated: Secondary | ICD-10-CM | POA: Diagnosis not present

## 2020-08-06 DIAGNOSIS — R141 Gas pain: Secondary | ICD-10-CM | POA: Diagnosis not present

## 2020-08-06 DIAGNOSIS — E119 Type 2 diabetes mellitus without complications: Secondary | ICD-10-CM | POA: Diagnosis not present

## 2020-08-06 DIAGNOSIS — K5904 Chronic idiopathic constipation: Secondary | ICD-10-CM | POA: Diagnosis not present

## 2020-08-06 DIAGNOSIS — M6281 Muscle weakness (generalized): Secondary | ICD-10-CM | POA: Diagnosis not present

## 2020-08-06 DIAGNOSIS — R1084 Generalized abdominal pain: Secondary | ICD-10-CM | POA: Diagnosis not present

## 2020-08-06 DIAGNOSIS — R140 Abdominal distension (gaseous): Secondary | ICD-10-CM | POA: Diagnosis not present

## 2020-08-10 DIAGNOSIS — M79672 Pain in left foot: Secondary | ICD-10-CM | POA: Diagnosis not present

## 2020-08-10 DIAGNOSIS — M6281 Muscle weakness (generalized): Secondary | ICD-10-CM | POA: Diagnosis not present

## 2020-08-10 DIAGNOSIS — R262 Difficulty in walking, not elsewhere classified: Secondary | ICD-10-CM | POA: Diagnosis not present

## 2020-08-10 DIAGNOSIS — M79671 Pain in right foot: Secondary | ICD-10-CM | POA: Diagnosis not present

## 2020-08-11 DIAGNOSIS — I5032 Chronic diastolic (congestive) heart failure: Secondary | ICD-10-CM | POA: Diagnosis not present

## 2020-08-11 DIAGNOSIS — I421 Obstructive hypertrophic cardiomyopathy: Secondary | ICD-10-CM | POA: Diagnosis not present

## 2020-08-11 DIAGNOSIS — E119 Type 2 diabetes mellitus without complications: Secondary | ICD-10-CM | POA: Diagnosis not present

## 2020-08-11 DIAGNOSIS — Z9981 Dependence on supplemental oxygen: Secondary | ICD-10-CM | POA: Diagnosis not present

## 2020-08-11 DIAGNOSIS — K5904 Chronic idiopathic constipation: Secondary | ICD-10-CM | POA: Diagnosis not present

## 2020-08-17 DIAGNOSIS — M79672 Pain in left foot: Secondary | ICD-10-CM | POA: Diagnosis not present

## 2020-08-17 DIAGNOSIS — R262 Difficulty in walking, not elsewhere classified: Secondary | ICD-10-CM | POA: Diagnosis not present

## 2020-08-17 DIAGNOSIS — M79671 Pain in right foot: Secondary | ICD-10-CM | POA: Diagnosis not present

## 2020-08-17 DIAGNOSIS — M6281 Muscle weakness (generalized): Secondary | ICD-10-CM | POA: Diagnosis not present

## 2020-08-25 NOTE — Progress Notes (Deleted)
Referring-Alyssa Alyssa Reichmann, MD Reason for referral-hypertrophic cardiomyopathy  HPI: 75 year old female for evaluation of hypertrophic cardiomyopathy at request of Alyssa Cower, MD.  Previously seen but not since May 2018. Patient previously lived in [place]. Records from there include: Nuclear study in November of 2011 showed an ejection fraction of 62% and normal perfusion. Exercise treadmill February 2014 showed poor exercise capacity and study was technically difficult. However no hypotension noted. Carotid Dopplers January 2014 showed no significant carotid disease. 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 Echo 2/17 vigorous LV function; severe asymmetric LVH, grade 1 DD; severe LAE; findings cw HOCM.  Most recent echocardiogram March 2022 showed severe asymmetric septal hypertrophy measuring 2.4 cm, systolic anterior motion of the mitral valve with LVOT obstruction at 42 to 47 mmHg, normal LV function, moderate mitral regurgitation, mild to moderate calcific mitral stenosis, moderate pulmonary hypertension, severe left atrial enlargement.  Recently admitted with complaints of weakness.  Lasix was held and she was treated with IV fluids with improvement.  Current Outpatient Medications  Medication Sig Dispense Refill  . albuterol (ACCUNEB) 0.63 MG/3ML nebulizer solution INHALE 1 VIAL BY MOUTH VIA NEBULIZATION EVERY 6 HOURS AS NEEDED FOR WHEEZING. (Patient not taking: Reported on 07/21/2020) 75 mL 12  . albuterol (PROAIR HFA) 108 (90 Base) MCG/ACT inhaler Inhale 2 puffs into the lungs 2 (two) times daily. (Patient taking differently: Inhale 2 puffs into the lungs 2 (two) times daily. May repeat if needed) 8.5 Inhaler 2  . allopurinol (ZYLOPRIM) 100 MG tablet Take 1 tablet (100 mg total) by mouth daily. 90 tablet 3  . ammonium lactate (AMLACTIN) 12 % cream APPLY TOPICALLY AS NEEDED FOR DRY SKIN. 385 g 0  . aspirin 81 MG tablet Take 162 mg by mouth daily.     Marland Kitchen atorvastatin  (LIPITOR) 80 MG tablet TAKE 1 TABLET BY MOUTH EVERY DAY 90 tablet 2  . Blood Glucose Monitoring Suppl (ONE TOUCH ULTRA 2) w/Device KIT Use as directed three times daily E11.9 1 kit 0  . cetirizine (ZYRTEC) 10 MG tablet TAKE 1 TABLET BY MOUTH EVERY DAY AS NEEDED 90 tablet 3  . Continuous Blood Gluc Receiver (FREESTYLE LIBRE READER) DEVI Apply 1 Device topically 4 (four) times daily as needed. E11.9 1 each 0  . Continuous Blood Gluc Sensor (FREESTYLE LIBRE 14 DAY SENSOR) MISC Apply 1 Device topically every 14 (fourteen) days. E11.9 6 each 3  . diltiazem (CARDIZEM CD) 120 MG 24 hr capsule Take 1 capsule (120 mg total) by mouth daily. 30 capsule 0  . DULoxetine (CYMBALTA) 30 MG capsule Take 1 capsule (30 mg total) by mouth daily. (Patient not taking: Reported on 07/21/2020) 90 capsule 3  . furosemide (LASIX) 20 MG tablet TAKE 3 TABLETS (60 MG TOTAL) BY MOUTH 2 TIMES DAILY. 180 tablet 1  . gabapentin (NEURONTIN) 100 MG capsule Take 2 capsules (200 mg total) by mouth 2 (two) times daily. 120 capsule 0  . glipiZIDE (GLUCOTROL XL) 2.5 MG 24 hr tablet TAKE 1 TABLET (2.5 MG TOTAL) BY MOUTH DAILY WITH BREAKFAST. 90 tablet 3  . glucose blood (ONETOUCH ULTRA) test strip Use as instructed three times daily E11.9 (Patient not taking: Reported on 07/21/2020) 300 each 12  . Lancets MISC Use as directed three times daily E11.9 300 each 3  . lidocaine (LIDODERM) 5 % Place 1 patch onto the skin daily. Remove & Discard patch within 12 hours or as directed by MD (Patient not taking: No sig reported) 40 patch  1  . meclizine (ANTIVERT) 25 MG tablet TAKE 1 TABLET BY MOUTH THREE TIMES A DAY (Patient taking differently: Take 25 mg by mouth daily.) 30 tablet 5  . metoprolol tartrate (LOPRESSOR) 25 MG tablet Take 1 tablet (25 mg total) by mouth 2 (two) times daily. 60 tablet 0  . pantoprazole (PROTONIX) 40 MG tablet Take 1 tablet (40 mg total) by mouth daily. 90 tablet 3  . triamcinolone (NASACORT AQ) 55 MCG/ACT AERO nasal inhaler  Place 2 sprays into the nose daily. 3 Inhaler 3   No current facility-administered medications for this visit.    Allergies  Allergen Reactions  . Sulfa Antibiotics Hives  . Metformin And Related   . Other Hives    Unknown drug  . Prednisone Swelling    Past Medical History:  Diagnosis Date  . Abnormal MRI, spine 11/2011   ?discitis L5-S1, s/p CT bx 12/12/11  . Allergic rhinitis, cause unspecified 01/31/2013  . Arthritis   . Asthma   . Cervical cancer ([place])   . Chronic diastolic heart failure ([place])   . Diabetic retinopathy ([place])   . GERD (gastroesophageal reflux disease)   . H/O cardiovascular stress test    Nuclear study in 2011 normal perfusion  . HOCM (hypertrophic obstructive cardiomyopathy) ([place])    Echo 12/19/11: Severe LVH, EF 55-65%, dynamic obstruction, wall motion normal, grade 1 diastolic dysfunction, systolic anterior motion of the mitral valve with mild MS and mild MR, mild LAE, PASP 34  . Hyperlipidemia   . Hypertension   . Kidney stones   . Obesity   . Type II or unspecified type diabetes mellitus without mention of complication, uncontrolled 01/31/2013    Past Surgical History:  Procedure Laterality Date  . ABDOMINAL HYSTERECTOMY    . JOINT REPLACEMENT     right knee 2006  . rotater cuff     right, 2005    Social History   Socioeconomic History  . Marital status: Widowed    Spouse name: Not on file  . Number of children: 5  . Years of education: 58  . Highest education level: Not on file  Occupational History  . Occupation: Retired Optometrist  Tobacco Use  . Smoking status: Never Smoker  . Smokeless tobacco: Never Used  Substance and Sexual Activity  . Alcohol use: No    Alcohol/week: 0.0 standard drinks  . Drug use: No  . Sexual activity: Not on file  Other Topics Concern  . Not on file  Social History Narrative  . Not on file   Social Determinants of Health   Financial Resource Strain: Not on file  Food Insecurity: Not on file   Transportation Needs: Not on file  Physical Activity: Not on file  Stress: Not on file  Social Connections: Not on file  Intimate Partner Violence: Not on file    Family History  Problem Relation Age of Onset  . Heart disease Father   . Arthritis Other   . Heart disease Other   . Hypertension Other   . Diabetes Other   . Alcohol abuse Other   . Arthritis Other   . Cancer Other        Lung Cancer  . Heart disease Other   . Hypertension Other   . Sudden death Other   . Kidney disease Other   . Mental illness Other   . Diabetes Other   . Colon cancer Neg Hx     ROS: no fevers or chills, productive cough, hemoptysis,  dysphasia, odynophagia, melena, hematochezia, dysuria, hematuria, rash, seizure activity, orthopnea, PND, pedal edema, claudication. Remaining systems are negative.  Physical Exam:   There were no vitals taken for this visit.  General:  Well developed/well nourished in NAD Skin warm/dry Patient not depressed No peripheral clubbing Back-normal HEENT-normal/normal eyelids Neck supple/normal carotid upstroke bilaterally; no bruits; no JVD; no thyromegaly chest - CTA/ normal expansion CV - RRR/normal S1 and S2; no murmurs, rubs or gallops;  PMI nondisplaced Abdomen -NT/ND, no HSM, no mass, + bowel sounds, no bruit 2+ femoral pulses, no bruits Ext-no edema, chords, 2+ DP Neuro-grossly nonfocal  ECG - personally reviewed  A/P  1 hypertrophic obstructive cardiomyopathy-  2 hypertension-  3 chronic diastolic congestive heart failure-  Kirk Ruths, MD

## 2020-08-26 DIAGNOSIS — R262 Difficulty in walking, not elsewhere classified: Secondary | ICD-10-CM | POA: Diagnosis not present

## 2020-08-26 DIAGNOSIS — M79672 Pain in left foot: Secondary | ICD-10-CM | POA: Diagnosis not present

## 2020-08-26 DIAGNOSIS — I421 Obstructive hypertrophic cardiomyopathy: Secondary | ICD-10-CM | POA: Diagnosis not present

## 2020-08-26 DIAGNOSIS — M79644 Pain in right finger(s): Secondary | ICD-10-CM | POA: Diagnosis not present

## 2020-08-26 DIAGNOSIS — M79671 Pain in right foot: Secondary | ICD-10-CM | POA: Diagnosis not present

## 2020-08-26 DIAGNOSIS — M25562 Pain in left knee: Secondary | ICD-10-CM | POA: Diagnosis not present

## 2020-08-26 DIAGNOSIS — I5032 Chronic diastolic (congestive) heart failure: Secondary | ICD-10-CM | POA: Diagnosis not present

## 2020-08-26 DIAGNOSIS — E1169 Type 2 diabetes mellitus with other specified complication: Secondary | ICD-10-CM | POA: Diagnosis not present

## 2020-08-26 DIAGNOSIS — M6281 Muscle weakness (generalized): Secondary | ICD-10-CM | POA: Diagnosis not present

## 2020-08-31 ENCOUNTER — Ambulatory Visit: Payer: Medicare Other | Admitting: Cardiology

## 2020-09-06 DIAGNOSIS — N179 Acute kidney failure, unspecified: Secondary | ICD-10-CM | POA: Diagnosis not present

## 2020-09-06 DIAGNOSIS — M6281 Muscle weakness (generalized): Secondary | ICD-10-CM | POA: Diagnosis not present

## 2020-09-06 DIAGNOSIS — R2689 Other abnormalities of gait and mobility: Secondary | ICD-10-CM | POA: Diagnosis not present

## 2020-09-07 DIAGNOSIS — R2689 Other abnormalities of gait and mobility: Secondary | ICD-10-CM | POA: Diagnosis not present

## 2020-09-07 DIAGNOSIS — N179 Acute kidney failure, unspecified: Secondary | ICD-10-CM | POA: Diagnosis not present

## 2020-09-07 DIAGNOSIS — M6281 Muscle weakness (generalized): Secondary | ICD-10-CM | POA: Diagnosis not present

## 2020-09-08 DIAGNOSIS — R2689 Other abnormalities of gait and mobility: Secondary | ICD-10-CM | POA: Diagnosis not present

## 2020-09-08 DIAGNOSIS — M6281 Muscle weakness (generalized): Secondary | ICD-10-CM | POA: Diagnosis not present

## 2020-09-08 DIAGNOSIS — N179 Acute kidney failure, unspecified: Secondary | ICD-10-CM | POA: Diagnosis not present

## 2020-09-09 DIAGNOSIS — N179 Acute kidney failure, unspecified: Secondary | ICD-10-CM | POA: Diagnosis not present

## 2020-09-09 DIAGNOSIS — M6281 Muscle weakness (generalized): Secondary | ICD-10-CM | POA: Diagnosis not present

## 2020-09-09 DIAGNOSIS — R2689 Other abnormalities of gait and mobility: Secondary | ICD-10-CM | POA: Diagnosis not present

## 2020-09-10 DIAGNOSIS — R2689 Other abnormalities of gait and mobility: Secondary | ICD-10-CM | POA: Diagnosis not present

## 2020-09-10 DIAGNOSIS — M6281 Muscle weakness (generalized): Secondary | ICD-10-CM | POA: Diagnosis not present

## 2020-09-10 DIAGNOSIS — N179 Acute kidney failure, unspecified: Secondary | ICD-10-CM | POA: Diagnosis not present

## 2020-09-11 DIAGNOSIS — R2689 Other abnormalities of gait and mobility: Secondary | ICD-10-CM | POA: Diagnosis not present

## 2020-09-11 DIAGNOSIS — M6281 Muscle weakness (generalized): Secondary | ICD-10-CM | POA: Diagnosis not present

## 2020-09-11 DIAGNOSIS — N179 Acute kidney failure, unspecified: Secondary | ICD-10-CM | POA: Diagnosis not present

## 2020-09-13 DIAGNOSIS — M6281 Muscle weakness (generalized): Secondary | ICD-10-CM | POA: Diagnosis not present

## 2020-09-13 DIAGNOSIS — N179 Acute kidney failure, unspecified: Secondary | ICD-10-CM | POA: Diagnosis not present

## 2020-09-13 DIAGNOSIS — R2689 Other abnormalities of gait and mobility: Secondary | ICD-10-CM | POA: Diagnosis not present

## 2020-09-14 DIAGNOSIS — R2689 Other abnormalities of gait and mobility: Secondary | ICD-10-CM | POA: Diagnosis not present

## 2020-09-14 DIAGNOSIS — N179 Acute kidney failure, unspecified: Secondary | ICD-10-CM | POA: Diagnosis not present

## 2020-09-14 DIAGNOSIS — M6281 Muscle weakness (generalized): Secondary | ICD-10-CM | POA: Diagnosis not present

## 2020-09-15 DIAGNOSIS — R2689 Other abnormalities of gait and mobility: Secondary | ICD-10-CM | POA: Diagnosis not present

## 2020-09-15 DIAGNOSIS — M6281 Muscle weakness (generalized): Secondary | ICD-10-CM | POA: Diagnosis not present

## 2020-09-15 DIAGNOSIS — N179 Acute kidney failure, unspecified: Secondary | ICD-10-CM | POA: Diagnosis not present

## 2020-09-16 DIAGNOSIS — N179 Acute kidney failure, unspecified: Secondary | ICD-10-CM | POA: Diagnosis not present

## 2020-09-16 DIAGNOSIS — R2689 Other abnormalities of gait and mobility: Secondary | ICD-10-CM | POA: Diagnosis not present

## 2020-09-16 DIAGNOSIS — M6281 Muscle weakness (generalized): Secondary | ICD-10-CM | POA: Diagnosis not present

## 2020-09-17 DIAGNOSIS — M6281 Muscle weakness (generalized): Secondary | ICD-10-CM | POA: Diagnosis not present

## 2020-09-17 DIAGNOSIS — R2689 Other abnormalities of gait and mobility: Secondary | ICD-10-CM | POA: Diagnosis not present

## 2020-09-17 DIAGNOSIS — N179 Acute kidney failure, unspecified: Secondary | ICD-10-CM | POA: Diagnosis not present

## 2020-09-20 DIAGNOSIS — E1169 Type 2 diabetes mellitus with other specified complication: Secondary | ICD-10-CM | POA: Diagnosis not present

## 2020-09-20 DIAGNOSIS — R2689 Other abnormalities of gait and mobility: Secondary | ICD-10-CM | POA: Diagnosis not present

## 2020-09-20 DIAGNOSIS — I421 Obstructive hypertrophic cardiomyopathy: Secondary | ICD-10-CM | POA: Diagnosis not present

## 2020-09-20 DIAGNOSIS — M109 Gout, unspecified: Secondary | ICD-10-CM | POA: Diagnosis not present

## 2020-09-20 DIAGNOSIS — M25562 Pain in left knee: Secondary | ICD-10-CM | POA: Diagnosis not present

## 2020-09-20 DIAGNOSIS — I5032 Chronic diastolic (congestive) heart failure: Secondary | ICD-10-CM | POA: Diagnosis not present

## 2020-09-20 DIAGNOSIS — N179 Acute kidney failure, unspecified: Secondary | ICD-10-CM | POA: Diagnosis not present

## 2020-09-20 DIAGNOSIS — M6281 Muscle weakness (generalized): Secondary | ICD-10-CM | POA: Diagnosis not present

## 2020-09-21 ENCOUNTER — Other Ambulatory Visit: Payer: Self-pay | Admitting: Internal Medicine

## 2020-09-21 NOTE — Telephone Encounter (Signed)
Please refill as per office routine med refill policy (all routine meds refilled for 3 mo or monthly per pt preference up to one year from last visit, then month to month grace period for 3 mo, then further med refills will have to be denied)  

## 2020-09-24 ENCOUNTER — Telehealth: Payer: Self-pay | Admitting: Internal Medicine

## 2020-09-24 NOTE — Telephone Encounter (Signed)
Patient was referred to a [facility] agency at her recent hospital stay and her insurance called her and told her she would need a prior auth for it to be able to receive care

## 2020-09-28 ENCOUNTER — Encounter: Payer: Self-pay | Admitting: Internal Medicine

## 2020-09-28 ENCOUNTER — Ambulatory Visit (INDEPENDENT_AMBULATORY_CARE_PROVIDER_SITE_OTHER): Payer: Medicare Other | Admitting: Internal Medicine

## 2020-09-28 ENCOUNTER — Inpatient Hospital Stay: Payer: Medicare Other | Admitting: Internal Medicine

## 2020-09-28 ENCOUNTER — Other Ambulatory Visit: Payer: Self-pay

## 2020-09-28 ENCOUNTER — Other Ambulatory Visit: Payer: Self-pay | Admitting: Internal Medicine

## 2020-09-28 VITALS — BP 124/76 | HR 61 | Temp 98.3°F | Ht 61.0 in

## 2020-09-28 DIAGNOSIS — E538 Deficiency of other specified B group vitamins: Secondary | ICD-10-CM | POA: Diagnosis not present

## 2020-09-28 DIAGNOSIS — N179 Acute kidney failure, unspecified: Secondary | ICD-10-CM

## 2020-09-28 DIAGNOSIS — E1165 Type 2 diabetes mellitus with hyperglycemia: Secondary | ICD-10-CM

## 2020-09-28 DIAGNOSIS — M25511 Pain in right shoulder: Secondary | ICD-10-CM | POA: Diagnosis not present

## 2020-09-28 DIAGNOSIS — Z0001 Encounter for general adult medical examination with abnormal findings: Secondary | ICD-10-CM | POA: Diagnosis not present

## 2020-09-28 DIAGNOSIS — E559 Vitamin D deficiency, unspecified: Secondary | ICD-10-CM | POA: Diagnosis not present

## 2020-09-28 DIAGNOSIS — G8929 Other chronic pain: Secondary | ICD-10-CM

## 2020-09-28 DIAGNOSIS — I10 Essential (primary) hypertension: Secondary | ICD-10-CM | POA: Diagnosis not present

## 2020-09-28 DIAGNOSIS — G629 Polyneuropathy, unspecified: Secondary | ICD-10-CM | POA: Diagnosis not present

## 2020-09-28 LAB — HEPATIC FUNCTION PANEL
ALT: 9 U/L (ref 0–35)
AST: 12 U/L (ref 0–37)
Albumin: 4.3 g/dL (ref 3.5–5.2)
Alkaline Phosphatase: 89 U/L (ref 39–117)
Bilirubin, Direct: 0.1 mg/dL (ref 0.0–0.3)
Total Bilirubin: 0.6 mg/dL (ref 0.2–1.2)
Total Protein: 8.3 g/dL (ref 6.0–8.3)

## 2020-09-28 LAB — URINALYSIS, ROUTINE W REFLEX MICROSCOPIC
Bilirubin Urine: NEGATIVE
Hgb urine dipstick: NEGATIVE
Ketones, ur: NEGATIVE
Nitrite: NEGATIVE
RBC / HPF: NONE SEEN (ref 0–?)
Specific Gravity, Urine: 1.02 (ref 1.000–1.030)
Total Protein, Urine: NEGATIVE
Urine Glucose: NEGATIVE
Urobilinogen, UA: 0.2 (ref 0.0–1.0)
pH: 5.5 (ref 5.0–8.0)

## 2020-09-28 LAB — LIPID PANEL
Cholesterol: 255 mg/dL — ABNORMAL HIGH (ref 0–200)
HDL: 54.5 mg/dL (ref >39.00)
LDL Cholesterol: 174 mg/dL — ABNORMAL HIGH (ref 0–99)
NonHDL: 200.32
Total CHOL/HDL Ratio: 5
Triglycerides: 133 mg/dL (ref 0.0–149.0)
VLDL: 26.6 mg/dL (ref 0.0–40.0)

## 2020-09-28 LAB — MICROALBUMIN / CREATININE URINE RATIO
Creatinine,U: 141.6 mg/dL
Microalb Creat Ratio: 2.8 mg/g (ref 0.0–30.0)
Microalb, Ur: 3.9 mg/dL — ABNORMAL HIGH (ref 0.0–1.9)

## 2020-09-28 LAB — CBC WITH DIFFERENTIAL/PLATELET
Basophils Absolute: 0.1 10*3/uL (ref 0.0–0.1)
Basophils Relative: 0.9 % (ref 0.0–3.0)
Eosinophils Absolute: 0.2 10*3/uL (ref 0.0–0.7)
Eosinophils Relative: 2.8 % (ref 0.0–5.0)
HCT: 37.2 % (ref 36.0–46.0)
Hemoglobin: 12.1 g/dL (ref 12.0–15.0)
Lymphocytes Relative: 29.8 % (ref 12.0–46.0)
Lymphs Abs: 2.4 10*3/uL (ref 0.7–4.0)
MCHC: 32.6 g/dL (ref 30.0–36.0)
MCV: 87.3 fl (ref 78.0–100.0)
Monocytes Absolute: 0.7 10*3/uL (ref 0.1–1.0)
Monocytes Relative: 8.5 % (ref 3.0–12.0)
Neutro Abs: 4.7 10*3/uL (ref 1.4–7.7)
Neutrophils Relative %: 58 % (ref 43.0–77.0)
Platelets: 301 10*3/uL (ref 150.0–400.0)
RBC: 4.26 Mil/uL (ref 3.87–5.11)
RDW: 18.8 % — ABNORMAL HIGH (ref 11.5–15.5)
WBC: 8.2 10*3/uL (ref 4.0–10.5)

## 2020-09-28 LAB — VITAMIN D 25 HYDROXY (VIT D DEFICIENCY, FRACTURES): VITD: 28.53 ng/mL — ABNORMAL LOW (ref 30.00–100.00)

## 2020-09-28 LAB — VITAMIN B12: Vitamin B-12: 1145 pg/mL — ABNORMAL HIGH (ref 211–911)

## 2020-09-28 LAB — BASIC METABOLIC PANEL
BUN: 27 mg/dL — ABNORMAL HIGH (ref 6–23)
CO2: 31 mEq/L (ref 19–32)
Calcium: 10.4 mg/dL (ref 8.4–10.5)
Chloride: 101 mEq/L (ref 96–112)
Creatinine, Ser: 0.92 mg/dL (ref 0.40–1.20)
GFR: 61.09 mL/min (ref >60.00)
Glucose, Bld: 105 mg/dL — ABNORMAL HIGH (ref 70–99)
Potassium: 4 mEq/L (ref 3.5–5.1)
Sodium: 142 mEq/L (ref 135–145)

## 2020-09-28 LAB — HEMOGLOBIN A1C: Hgb A1c MFr Bld: 6.2 % (ref 4.6–6.5)

## 2020-09-28 LAB — TSH: TSH: 0.49 u[IU]/mL (ref 0.35–4.50)

## 2020-09-28 MED ORDER — ATORVASTATIN CALCIUM 80 MG PO TABS: 1.0000 | ORAL_TABLET | Freq: Every day | ORAL | 3 refills | Status: DC

## 2020-09-28 MED ORDER — DULOXETINE HCL 60 MG PO CPEP: 60.0000 mg | ORAL_CAPSULE | Freq: Every day | ORAL | 3 refills | Status: DC

## 2020-09-28 MED ORDER — CIPROFLOXACIN HCL 500 MG PO TABS: 500.0000 mg | ORAL_TABLET | Freq: Two times a day (BID) | ORAL | 0 refills | Status: AC

## 2020-09-28 NOTE — Patient Instructions (Signed)
Ok to increase the cymbalta to 60 mg per day  You will be contacted regarding the referral for: [place] with PT  Please follow up with Dr Tamala Julian for the right shoulder  Please continue all other medications as before, and refills have been done if requested.  Please have the pharmacy call with any other refills you may need.  Please continue your efforts at being more active, low cholesterol diet, and weight control.  You are otherwise up to date with prevention measures today.  Please keep your appointments with your specialists as you may have planned  Please go to the LAB at the blood drawing area for the tests to be done  You will be contacted by phone if any changes need to be made immediately.  Otherwise, you will receive a letter about your results with an explanation, but please check with MyChart first.  Please remember to sign up for MyChart if you have not done so, as this will be important to you in the future with finding out test results, communicating by private email, and scheduling acute appointments online when needed.  Please make an Appointment to return in 6 months, or sooner if needed

## 2020-09-28 NOTE — Progress Notes (Signed)
Patient ID: Alyssa Ingram, female   DOB: 01/03/1946, 75 y.o.   MRN: [id number]         Chief Complaint:: wellness exam and Hospitalization Follow-up (Urinary tract infection)  , low vit d, neuropathy, right shoudler pain, AKI, dm, htn       HPI:  AMORI COOPERMAN is a 75 y.o. female here for wellness exam; declines colonoscopy, dxa, covid booster, but plans for eye exam later in the year  O/w up to date with preventive referrals and immunizations.                          Also now s/p recent hospn 3/9 - 3/15 with AKI in the setting of CM, DM, hld, and generalized weakness requiring PT OT at SNF placement, now home for the past week overall doing ok, no falls and getting by with ADLs.  Pt denies chest pain, increased sob or doe, wheezing, orthopnea, PND, increased LE swelling, palpitations, dizziness or syncope.   Pt denies polydipsia, polyuria, or new focal neuro s/s.   Pt denies fever, wt loss, night sweats, loss of appetite, or other constitutional symptoms  Denies urinary symptoms such as dysuria, frequency, urgency, flank pain, hematuria or n/v, fever, chills.  Still has some degree of debility and need HH with PT per son with her.  Neuropathy symptoms not as well controlled, cannot take increased gabapentin due to sedation.  Has known chronic right shoulder decreased ROM to 90 degrees but has increased pain recently and worsening ROM on top of that recently.  BP Readings from Last 3 Encounters:  09/28/20 124/76  07/27/20 (!) 147/80  05/06/20 132/86   Immunization History  Administered Date(s) Administered  . Fluad Quad(high Dose 65+) 03/04/2019  . Influenza, High Dose Seasonal PF 06/20/2017, 01/30/2018  . Influenza,inj,Quad PF,6+ Mos 01/31/2013, 03/11/2014, 01/26/2015  . Influenza-Unspecified 03/16/2011  . PFIZER(Purple Top)SARS-COV-2 Vaccination 07/17/2019, 08/12/2019  . Pneumococcal Conjugate-13 01/31/2013  . Pneumococcal-Unspecified 03/16/2011  . Tdap 07/17/2018   Health  Maintenance Due  Topic Date Due  . COLONOSCOPY (Pts 45-61yr Insurance coverage will need to be confirmed)  Never done  . DEXA SCAN  Never done  . OPHTHALMOLOGY EXAM  06/24/2016  . COVID-19 Vaccine (3 - Pfizer risk 4-dose series) 09/09/2019      Past Medical History:  Diagnosis Date  . Abnormal MRI, spine 11/2011   ?discitis L5-S1, s/p CT bx 12/12/11  . Allergic rhinitis, cause unspecified 01/31/2013  . Arthritis   . Asthma   . [place]   . Chronic diastolic heart failure ([facility]   . Diabetic retinopathy ([place]   . GERD (gastroesophageal reflux disease)   . H/O cardiovascular stress test    Nuclear study in 2011 normal perfusion  . HOCM (hypertrophic obstructive cardiomyopathy) ([place]    Echo 12/19/11: Severe LVH, EF 55-65%, dynamic obstruction, wall motion normal, grade 1 diastolic dysfunction, systolic anterior motion of the mitral valve with mild MS and mild MR, mild LAE, PASP 34  . Hyperlipidemia   . Hypertension   . Kidney stones   . Obesity   . Type II or unspecified type diabetes mellitus without mention of complication, uncontrolled 01/31/2013   Past Surgical History:  Procedure Laterality Date  . ABDOMINAL HYSTERECTOMY    . JOINT REPLACEMENT     right knee 2006  . rotater cuff     right, 2005    reports that she has never smoked. She has never used  smokeless tobacco. She reports that she does not drink alcohol and does not use drugs. family history includes Alcohol abuse in an other family member; Arthritis in some other family members; Cancer in an other family member; Diabetes in some other family members; Heart disease in her father and other family members; Hypertension in some other family members; Kidney disease in an other family member; Mental illness in an other family member; Sudden death in an other family member. Allergies  Allergen Reactions  . Sulfa Antibiotics Hives  . Metformin And Related   . Other Hives    Unknown drug  . Prednisone Swelling    Current Outpatient Medications on File Prior to Visit  Medication Sig Dispense Refill  . albuterol (ACCUNEB) 0.63 MG/3ML nebulizer solution INHALE 1 VIAL BY MOUTH VIA NEBULIZATION EVERY 6 HOURS AS NEEDED FOR WHEEZING. 75 mL 12  . albuterol (PROAIR HFA) 108 (90 Base) MCG/ACT inhaler Inhale 2 puffs into the lungs 2 (two) times daily. (Patient taking differently: Inhale 2 puffs into the lungs 2 (two) times daily. May repeat if needed) 8.5 Inhaler 2  . allopurinol (ZYLOPRIM) 100 MG tablet Take 1 tablet (100 mg total) by mouth daily. 90 tablet 3  . ammonium lactate (AMLACTIN) 12 % cream APPLY TOPICALLY AS NEEDED FOR DRY SKIN. 385 g 0  . aspirin 81 MG tablet Take 162 mg by mouth daily.     . Blood Glucose Monitoring Suppl (ONE TOUCH ULTRA 2) w/Device KIT Use as directed three times daily E11.9 1 kit 0  . cetirizine (ZYRTEC) 10 MG tablet TAKE 1 TABLET BY MOUTH EVERY DAY AS NEEDED 90 tablet 3  . Continuous Blood Gluc Receiver (FREESTYLE LIBRE READER) DEVI Apply 1 Device topically 4 (four) times daily as needed. E11.9 1 each 0  . Continuous Blood Gluc Sensor (FREESTYLE LIBRE 14 DAY SENSOR) MISC Apply 1 Device topically every 14 (fourteen) days. E11.9 6 each 3  . furosemide (LASIX) 20 MG tablet TAKE 3 TABLETS (60 MG TOTAL) BY MOUTH 2 TIMES DAILY. 180 tablet 1  . gabapentin (NEURONTIN) 100 MG capsule Take 2 capsules (200 mg total) by mouth 2 (two) times daily. 120 capsule 0  . glipiZIDE (GLUCOTROL XL) 2.5 MG 24 hr tablet TAKE 1 TABLET (2.5 MG TOTAL) BY MOUTH DAILY WITH BREAKFAST. 90 tablet 3  . glucose blood (ONETOUCH ULTRA) test strip Use as instructed three times daily E11.9 300 each 12  . Lancets MISC Use as directed three times daily E11.9 300 each 3  . lidocaine (LIDODERM) 5 % Place 1 patch onto the skin daily. Remove & Discard patch within 12 hours or as directed by MD 40 patch 1  . meclizine (ANTIVERT) 25 MG tablet TAKE 1 TABLET BY MOUTH THREE TIMES A DAY (Patient taking differently: Take 25 mg by  mouth daily.) 30 tablet 5  . pantoprazole (PROTONIX) 40 MG tablet Take 1 tablet (40 mg total) by mouth daily. 90 tablet 3  . triamcinolone (NASACORT AQ) 55 MCG/ACT AERO nasal inhaler Place 2 sprays into the nose daily. 3 Inhaler 3  . diltiazem (CARDIZEM CD) 120 MG 24 hr capsule Take 1 capsule (120 mg total) by mouth daily. 30 capsule 0  . metoprolol tartrate (LOPRESSOR) 25 MG tablet Take 1 tablet (25 mg total) by mouth 2 (two) times daily. 60 tablet 0   No current facility-administered medications on file prior to visit.        ROS:  All others reviewed and negative.  Objective  PE:  BP 124/76 (BP Location: Left Arm, Patient Position: Sitting, Cuff Size: Large)   Pulse 61   Temp 98.3 F (36.8 C) (Oral)   Ht _0  (1.549 m)   SpO2 97%   BMI 51.86 kg/m                 Constitutional: Pt appears in NAD               HENT: Head: NCAT.                Right Ear: External ear normal.                 Left Ear: External ear normal.                Eyes: . Pupils are equal, round, and reactive to light. Conjunctivae and EOM are normal               Nose: without d/c or deformity               Neck: Neck supple. Gross normal ROM               Cardiovascular: Normal rate and regular rhythm.                 Pulmonary/Chest: Effort normal and breath sounds without rales or wheezing.                Abd:  Soft, NT, ND, + BS, no organomegaly               Neurological: Pt is alert. At baseline orientation, motor grossly intact               Skin: Skin is warm. No rashes, no other new lesions, LE edema - none               Psychiatric: Pt behavior is normal without agitation   Micro: none  Cardiac tracings I have personally interpreted today:  none  Pertinent Radiological findings (summarize): none   Lab Results  Component Value Date   WBC 8.2 09/28/2020   HGB 12.1 09/28/2020   HCT 37.2 09/28/2020   PLT 301.0 09/28/2020   GLUCOSE 105 (H) 09/28/2020   CHOL 255 (H) 09/28/2020   TRIG  133.0 09/28/2020   HDL 54.50 09/28/2020   LDLCALC 174 (H) 09/28/2020   ALT 9 09/28/2020   AST 12 09/28/2020   NA 142 09/28/2020   K 4.0 09/28/2020   CL 101 09/28/2020   CREATININE 0.92 09/28/2020   BUN 27 (H) 09/28/2020   CO2 31 09/28/2020   TSH 0.49 09/28/2020   INR 1.03 12/12/2011   HGBA1C 6.2 09/28/2020   MICROALBUR 3.9 (H) 09/28/2020   Assessment/Plan:  HYDIA COPELIN is a 75 y.o. Declined [7] female with  has a past medical history of Abnormal MRI, spine (11/2011), Allergic rhinitis, cause unspecified (01/31/2013), Arthritis, Asthma, Cervical cancer ([place]), Chronic diastolic heart failure ([place]), Diabetic retinopathy ([place]), GERD (gastroesophageal reflux disease), H/O cardiovascular stress test, HOCM (hypertrophic obstructive cardiomyopathy) ([place]), Hyperlipidemia, Hypertension, Kidney stones, Obesity, and Type II or unspecified type diabetes mellitus without mention of complication, uncontrolled (01/31/2013).  Encounter for well adult exam with abnormal findings Age and sex appropriate education and counseling updated with regular exercise and diet Referrals for preventative services - declines colonoscopy dxa for now Immunizations addressed - declines covid booster Smoking counseling  - none needed Evidence for depression or other mood disorder -  none significant Most recent labs reviewed. I have personally reviewed and have noted: 1) the patient's medical and social history 2) The patient's current medications and supplements 3) The patient's height, weight, and BMI have been recorded in the chart   Right shoulder pain With possible mild acute on chronic pain and decreased ROM, cont home PT, OT but also to f/u sports medicine  Neuropathy Mild uncontrolled pain per pt, continue gabapentin, but also increase cymbalta 60 qd  Hypertension BP Readings from Last 3 Encounters:  09/28/20 124/76  07/27/20 (!) 147/80  05/06/20 132/86   Stable, pt to continue medical treatment  cardizem, lopressor   Diabetes [facility]) Lab Results  Component Value Date   HGBA1C 6.2 09/28/2020   Stable, pt to continue current medical treatment glipizide   AKI (acute kidney injury) ([place]) For f/u lab,  to f/u any worsening symptoms or concerns  Vitamin D deficiency Last vitamin D Lab Results  Component Value Date   VD25OH 28.53 (L) 09/28/2020   Low to start oral replacement   Followup: Return in about 6 months (around 03/31/2021).  Cathlean Cower, MD 10/02/2020 2:45 PM [facility]

## 2020-09-29 NOTE — Telephone Encounter (Signed)
Patient had appointment 09/28/20 and discussed at visit.

## 2020-10-02 ENCOUNTER — Encounter: Payer: Self-pay | Admitting: Internal Medicine

## 2020-10-02 DIAGNOSIS — E559 Vitamin D deficiency, unspecified: Secondary | ICD-10-CM | POA: Insufficient documentation

## 2020-10-02 NOTE — Assessment & Plan Note (Addendum)
Last vitamin D Lab Results  Component Value Date   VD25OH 28.53 (L) 09/28/2020   Low to start oral replacement

## 2020-10-02 NOTE — Assessment & Plan Note (Signed)
Mild uncontrolled pain per pt, continue gabapentin, but also increase cymbalta 60 qd

## 2020-10-02 NOTE — Assessment & Plan Note (Signed)
With possible mild acute on chronic pain and decreased ROM, cont home PT, OT but also to f/u sports medicine

## 2020-10-02 NOTE — Assessment & Plan Note (Signed)
Lab Results  Component Value Date   HGBA1C 6.2 09/28/2020   Stable, pt to continue current medical treatment glipizide

## 2020-10-02 NOTE — Assessment & Plan Note (Signed)
BP Readings from Last 3 Encounters:  09/28/20 124/76  07/27/20 (!) 147/80  05/06/20 132/86   Stable, pt to continue medical treatment cardizem, lopressor

## 2020-10-02 NOTE — Assessment & Plan Note (Signed)
For fu lab,  to f/u any worsening symptoms or concerns  

## 2020-10-02 NOTE — Assessment & Plan Note (Signed)
Age and sex appropriate education and counseling updated with regular exercise and diet Referrals for preventative services - declines colonoscopy dxa for now Immunizations addressed - declines covid booster Smoking counseling  - none needed Evidence for depression or other mood disorder - none significant Most recent labs reviewed. I have personally reviewed and have noted: 1) the patient's medical and social history 2) The patient's current medications and supplements 3) The patient's height, weight, and BMI have been recorded in the chart

## 2020-10-06 NOTE — Progress Notes (Signed)
I, [name], LAT, ATC, am serving as scribe for Dr. [name].  Alyssa Ingram is a 75 y.o. female who presents to [facility] today for cont R shoulder pain. Pt was most recently seen by Dr. Tamala Julian on 05/06/20 for LBP, L knee pain, and R elbow pain. Pt was last seen by Dr. Georgina Snell on 02/05/20 and was given a R [place] joint injection and was referred to home health PT. Today, pt reports that her R shoulder pain has returned.  She locates her pain to her R superior-lateral shoulder w/ radiating pain down to her R elbow.  She is unable to actively move her R shoulder/arm much.  She rates her pain at an 8.5/10.  Since the last visit in September of 2021 she was hospitalized for urinary tract infection and subsequently discharged to a skilled nursing facility for acute rehab.   No falls or injury to the right shoulder.  Dx imaging: 06/14/12 R shoulder XR  Pertinent review of systems: No fevers or chills  Relevant historical information: Hypertension, heart failure, GERD   Exam:  BP 104/64 (BP Location: Left Arm, Patient Position: Sitting, Cuff Size: Large)   Pulse 62   Ht 5\' 1"  (1.549 m)   SpO2 95%   BMI 51.86 kg/m  General: Well Developed, well nourished, and in no acute distress.   MSK: Right shoulder decreased range of motion.  Nontender.  Decreased muscle bulk.    Lab and Radiology Results  Procedure: Real-time Ultrasound Guided Injection of right shoulder glenohumeral joint posterior approach Device: Philips Affiniti 50G Images permanently stored and available for review in PACS Ultrasound evaluation prior to injection reveals degenerative changes posterior joint line. Verbal informed consent obtained.  Discussed risks and benefits of procedure. Warned about infection bleeding damage to structures skin hypopigmentation and fat atrophy among others. Patient expresses understanding and agreement Time-out conducted.   Noted no overlying erythema,  induration, or other signs of local infection.   Skin prepped in a sterile fashion.   Local anesthesia: Topical Ethyl chloride.   With sterile technique and under real time ultrasound guidance:  40 mg of Kenalog and 2 mL of Marcaine injected into joint capsule. Fluid seen entering the joint.   Completed without difficulty   Pain immediately resolved suggesting accurate placement of the medication.   Advised to call if fevers/chills, erythema, induration, drainage, or persistent bleeding.   Images permanently stored and available for review in the ultrasound unit.  Impression: Technically successful ultrasound guided injection.     Assessment and Plan: 75 y.o. 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  Plan for repeat injection.  Last injection September 2021 worked very well until recently.  Patient is in poor health and not a good candidate for aggressive interventions.  Recheck in the near future if not improving.  Otherwise recheck as needed.   PDMP not reviewed this encounter. Orders Placed This Encounter  Procedures  . [place] LIMITED JOINT SPACE STRUCTURES UP RIGHT(NO LINKED CHARGES)    Standing Status:   Future    Number of Occurrences:   1    Standing Expiration Date:   04/09/2021    Order Specific Question:   Reason for Exam (SYMPTOM  OR DIAGNOSIS REQUIRED)    Answer:   right shoulder pain    Order Specific Question:   Preferred imaging location?    Answer:   [facility]   No orders of the defined types were  placed in this encounter.    Discussed warning signs or symptoms. Please see discharge instructions. Patient expresses understanding.   The above documentation has been reviewed and is accurate and complete [name], M.D.

## 2020-10-07 ENCOUNTER — Other Ambulatory Visit: Payer: Self-pay

## 2020-10-07 ENCOUNTER — Ambulatory Visit: Payer: Self-pay

## 2020-10-07 ENCOUNTER — Ambulatory Visit (INDEPENDENT_AMBULATORY_CARE_PROVIDER_SITE_OTHER): Payer: Medicare Other | Admitting: Family Medicine

## 2020-10-07 VITALS — BP 104/64 | HR 62 | Ht 61.0 in

## 2020-10-07 DIAGNOSIS — G8929 Other chronic pain: Secondary | ICD-10-CM

## 2020-10-07 DIAGNOSIS — M25511 Pain in right shoulder: Secondary | ICD-10-CM

## 2020-10-07 NOTE — Patient Instructions (Addendum)
Good to see you today. ° °You had a R shoulder injection.  Call or go to the ER if you develop a large red swollen joint with extreme pain or oozing puss.  ° °Follow-up as needed. °

## 2020-10-12 DIAGNOSIS — I248 Other forms of acute ischemic heart disease: Secondary | ICD-10-CM | POA: Diagnosis not present

## 2020-10-12 DIAGNOSIS — Z7982 Long term (current) use of aspirin: Secondary | ICD-10-CM | POA: Diagnosis not present

## 2020-10-12 DIAGNOSIS — I5032 Chronic diastolic (congestive) heart failure: Secondary | ICD-10-CM | POA: Diagnosis not present

## 2020-10-12 DIAGNOSIS — E785 Hyperlipidemia, unspecified: Secondary | ICD-10-CM | POA: Diagnosis not present

## 2020-10-12 DIAGNOSIS — I110 Hypertensive heart disease with heart failure: Secondary | ICD-10-CM | POA: Diagnosis not present

## 2020-10-12 DIAGNOSIS — Z7984 Long term (current) use of oral hypoglycemic drugs: Secondary | ICD-10-CM | POA: Diagnosis not present

## 2020-10-12 DIAGNOSIS — M1712 Unilateral primary osteoarthritis, left knee: Secondary | ICD-10-CM | POA: Diagnosis not present

## 2020-10-12 DIAGNOSIS — E1142 Type 2 diabetes mellitus with diabetic polyneuropathy: Secondary | ICD-10-CM | POA: Diagnosis not present

## 2020-10-12 DIAGNOSIS — F32A Depression, unspecified: Secondary | ICD-10-CM | POA: Diagnosis not present

## 2020-10-12 DIAGNOSIS — M109 Gout, unspecified: Secondary | ICD-10-CM | POA: Diagnosis not present

## 2020-10-12 DIAGNOSIS — K219 Gastro-esophageal reflux disease without esophagitis: Secondary | ICD-10-CM | POA: Diagnosis not present

## 2020-10-12 DIAGNOSIS — J4530 Mild persistent asthma, uncomplicated: Secondary | ICD-10-CM | POA: Diagnosis not present

## 2020-10-12 DIAGNOSIS — I421 Obstructive hypertrophic cardiomyopathy: Secondary | ICD-10-CM | POA: Diagnosis not present

## 2020-10-12 DIAGNOSIS — Z87442 Personal history of urinary calculi: Secondary | ICD-10-CM | POA: Diagnosis not present

## 2020-10-18 ENCOUNTER — Telehealth: Payer: Self-pay | Admitting: Internal Medicine

## 2020-10-18 MED ORDER — ALLOPURINOL 100 MG PO TABS: 100.0000 mg | ORAL_TABLET | Freq: Every day | ORAL | 3 refills | Status: DC

## 2020-10-18 NOTE — Progress Notes (Incomplete)
Cardiology Office Note:    Date:  10/18/2020   ID:  Alyssa Ingram, Alyssa Ingram [birth year], MRN [id number]  PCP:  [name], MD   [facility] Providers Cardiologist:  None { Click to update primary MD,subspecialty MD or APP then REFRESH:1}    Referring MD: [name], MD   No chief complaint on file.   History of Present Illness:    Alyssa Ingram is a 75 y.o. female with a hx of arthritis, asthma, cervical cancer, Chronic diastolic heart failure, GERD, hypertrophic obstructive cardiomyopathy, hyperlipidemia, hypertension, type II diabetes mellitus (uncontrolled), and obesity*** here to re-establish care. She was last seen 09/2016 by Dr. [name] Breed.  Most recent echocardiogram on 07/24/2020 showed severe asymmetric hypertrophy of the basal septum measuring up to 2.4 cm, LVOT obstruction due to [facility] of the mitral valve measuring 45 mmHg, LVEF 70 to 75%, moderate MR due to MAC, mild to moderate MS, normal RV function, RVSP 62 mmHg.  Since last clinic visit,  She denies any chest pain, shortness of breath, palpitations, or exertional symptoms. No headaches, lightheadedness, or syncope to report. Also has no lower extremity edema, orthopnea or PND.   Past Medical History:  Diagnosis Date   Abnormal MRI, spine 11/2011   ?discitis L5-S1, s/p CT bx 12/12/11   Allergic rhinitis, cause unspecified 01/31/2013   Arthritis    Asthma    Cervical cancer ([place])    Chronic diastolic heart failure (HCC)    Diabetic retinopathy (HCC)    GERD (gastroesophageal reflux disease)    H/O cardiovascular stress test    Nuclear study in 2011 normal perfusion   HOCM (hypertrophic obstructive cardiomyopathy) ([place])    Echo 12/19/11: Severe LVH, EF 55-65%, dynamic obstruction, wall motion normal, grade 1 diastolic dysfunction, systolic anterior motion of the mitral valve with mild MS and mild MR, mild LAE, PASP 34   Hyperlipidemia    Hypertension    Kidney stones    Obesity    Type II or  unspecified type diabetes mellitus without mention of complication, uncontrolled 01/31/2013    Past Surgical History:  Procedure Laterality Date   ABDOMINAL HYSTERECTOMY     JOINT REPLACEMENT     right knee 2006   rotater cuff     right, 2005    Current Medications: No outpatient medications have been marked as taking for the 10/19/20 encounter (Appointment) with Donato Heinz, MD.     Allergies:   Sulfa antibiotics, Metformin and related, Other, and Prednisone   Social History   Socioeconomic History   Marital status: Widowed    Spouse name: Not on file   Number of children: 5   Years of education: 14   Highest education level: Not on file  Occupational History   Occupation: Retired Optometrist  Tobacco Use   Smoking status: Never Smoker   Smokeless tobacco: Never Used  Substance and Sexual Activity   Alcohol use: No    Alcohol/week: 0.0 standard drinks   Drug use: No   Sexual activity: Not on file  Other Topics Concern   Not on file  Social History Narrative   Not on file   Social Determinants of Health   Financial Resource Strain: Not on file  Food Insecurity: Not on file  Transportation Needs: Not on file  Physical Activity: Not on file  Stress: Not on file  Social Connections: Not on file     Family History: The patient's family history includes Alcohol abuse in an other family  member; Arthritis in some other family members; Cancer in an other family member; Diabetes in some other family members; Heart disease in her father and other family members; Hypertension in some other family members; Kidney disease in an other family member; Mental illness in an other family member; Sudden death in an other family member. There is no history of Colon cancer.  ROS:   Please see the history of present illness.    (+) All other systems reviewed and are negative.  EKGs/Labs/Other Studies Reviewed:    The following studies were reviewed  today:  Echo 07/24/2020: 1. Severe asymmetric hypertrophy of the basal septum up to 2.4 cm. There  is SAM of the MV with LVOT obstruction up to 42-47 mmHG. The MR is central  and moderate, rather than posteriorly directed. This is due to significant  posterior MAC. Findings are  consistent with hypertrophic cardiomyopathy which is known . Left  ventricular ejection fraction, by estimation, is 70 to 75%. The left  ventricle has hyperdynamic function. The left ventricle has no regional  wall motion abnormalities. There is severe  asymmetric left ventricular hypertrophy of the basal-septal segment. Left  ventricular diastolic function could not be evaluated.  2. Right ventricular systolic function is normal. The right ventricular  size is normal. There is moderately elevated pulmonary artery systolic  pressure. The estimated right ventricular systolic pressure is 32.4 mmHg.  3. Left atrial size was severely dilated.  4. Mild to moderate calcific mitral stenosis. MG 4 mmHG @ 64 bpm. MVA by  VTI 1.1 cm2. DVI 0.48. The mitral valve is degenerative. Mild to moderate  mitral valve regurgitation. Moderate mitral stenosis. The mean mitral  valve gradient is 4.0 mmHg with  average heart rate of 64 bpm. Severe mitral annular calcification.  5. The aortic valve is tricuspid. There is mild calcification of the  aortic valve. There is mild thickening of the aortic valve. Aortic valve  regurgitation is not visualized. Mild aortic valve sclerosis is present,  with no evidence of aortic valve  stenosis.  6. The inferior vena cava is dilated in size with >50% respiratory  variability, suggesting right atrial pressure of 8 mmHg.  [place] LE Venous 07/22/2020: RIGHT:  - There is no evidence of deep vein thrombosis in the lower extremity.  However, portions of this examination were limited- see technologist  comments above.  - No cystic structure found in the popliteal fossa.    LEFT:  - There is no  evidence of deep vein thrombosis in the lower extremity.  However, portions of this examination were limited- see technologist  comments above.  - No cystic structure found in the popliteal fossa.  TTE 10/06/2016: - Left ventricle: The cavity size was normal. Mild concentric LV  hypertrophy with severe asymmetric basal septal hypertrophy.  Systolic function was vigorous. The estimated ejection fraction  was in the range of 65% to 70%. Mild LV outflow tract gradient,  17 mmHg peak. Wall motion was normal; there were no regional wall  motion abnormalities. Doppler parameters are consistent with  abnormal left ventricular relaxation (grade 1 diastolic  dysfunction).  - Aortic valve: There was no stenosis.  - Mitral valve: I cannot see definite systolic anterior motion.  Severely calcified annulus. Moderately calcified leaflets . The  findings are consistent with mild stenosis. There was mild  regurgitation. Mean gradient (D): 4 mm Hg. Valve area by pressure  half-time: 1.39 cm^2.  - Left atrium: The atrium was moderately to severely dilated.  -  Right ventricle: The cavity size was normal. Systolic function  was normal.  - Tricuspid valve: Peak RV-RA gradient (S): 39 mm Hg.  - Pulmonary arteries: PA peak pressure: 42 mm Hg (S).  - Inferior vena cava: The vessel was normal in size. The  respirophasic diameter changes were in the normal range (= 50%),  consistent with normal central venous pressure.   Impressions:  - Normal LV size. Mild concentric LV hypertrophy with severe focal  basal septal hypertrophy. LV outflow gradient (mild) peak 17  mmHg. EF 65-70%, vigorous. No definite systolic anterior motion  could be visualized. Mild MR. The mitral valve was heavily  calcified, mild mitral stenosis. Mild pulmonary hypertension.  Normal RV size and systolic function. Moderate to severe LAE.  This study could be consistent with hypertrophic  cardiomyopathy.   EKG:   10/19/2020: ***  Recent Labs: 07/24/2020: Magnesium 2.0 09/28/2020: ALT 9; BUN 27; Creatinine, Ser 0.92; Hemoglobin 12.1; Platelets 301.0; Potassium 4.0; Sodium 142; TSH 0.49  Recent Lipid Panel    Component Value Date/Time   CHOL 255 (H) 09/28/2020 1549   TRIG 133.0 09/28/2020 1549   HDL 54.50 09/28/2020 1549   CHOLHDL 5 09/28/2020 1549   VLDL 26.6 09/28/2020 1549   LDLCALC 174 (H) 09/28/2020 1549     Risk Assessment/Calculations:   {Does this patient have ATRIAL FIBRILLATION?:[phone]}   Physical Exam:    VS:  There were no vitals taken for this visit.    Wt Readings from Last 3 Encounters:  07/27/20 274 lb 7.6 oz (124.5 kg)  05/06/20 217 lb (98.4 kg)  02/05/20 217 lb (98.4 kg)     GEN: Well nourished, well developed in no acute distress HEENT: Normal NECK: No JVD; No carotid bruits LYMPHATICS: No lymphadenopathy CARDIAC: RRR, no murmurs, rubs, gallops RESPIRATORY:  Clear to auscultation without rales, wheezing or rhonchi  ABDOMEN: Soft, non-tender, non-distended MUSCULOSKELETAL:  No edema; No deformity  SKIN: Warm and dry NEUROLOGIC:  Alert and oriented x 3 PSYCHIATRIC:  Normal affect   ASSESSMENT:    No diagnosis found. PLAN:    Hypertrophic obstructive cardiomyopathy: Most recent echocardiogram on 07/24/2020 showed severe asymmetric hypertrophy of the basal septum measuring up to 2.4 cm, LVOT obstruction due to [facility] of the mitral valve measuring 45 mmHg, LVEF 70 to 75%, moderate MR due to MAC, mild to moderate MS, normal RV function, RVSP 62 mmHg. -Recommend cardiac MRI  Hypertension: On metoprolol 25 mg twice daily, diltiazem 120 mg daily, Lasix 20 mg daily  Hyperlipidemia: On atorvastatin 80 mg daily  T2DM: On glipizide  {Are you ordering a CV Procedure (e.g. stress test, cath, DCCV, TEE, etc)?   Press F2        :[id number]}   RTC in ***  Medication Adjustments/Labs and Tests Ordered: Current medicines are reviewed at length  with the patient today.  Concerns regarding medicines are outlined above.  No orders of the defined types were placed in this encounter.  No orders of the defined types were placed in this encounter.   There are no Patient Instructions on file for this visit.   I,Mathew Stumpf,acting as a Education administrator for Donato Heinz, MD.,have documented all relevant documentation on the behalf of Donato Heinz, MD,as directed by  Donato Heinz, MD while in the presence of Donato Heinz, MD.  ***  Signed, Madelin Rear  10/18/2020 4:15 PM    [place]

## 2020-10-18 NOTE — Telephone Encounter (Signed)
1.Medication Requested: allopurinol (ZYLOPRIM) 100 MG tablet    2. Pharmacy (Name, Street, [place]): [id number] - [place], [place]  3. On Med List: yes   4. Last Visit with PCP: 09-28-20  5. Next visit date with PCP: 03-31-21   Agent: Please be advised that RX refills may take up to 3 business days. We ask that you follow-up with your pharmacy.

## 2020-10-19 ENCOUNTER — Ambulatory Visit: Payer: Medicare Other | Admitting: Cardiology

## 2020-10-21 DIAGNOSIS — I110 Hypertensive heart disease with heart failure: Secondary | ICD-10-CM | POA: Diagnosis not present

## 2020-10-21 DIAGNOSIS — I421 Obstructive hypertrophic cardiomyopathy: Secondary | ICD-10-CM | POA: Diagnosis not present

## 2020-10-21 DIAGNOSIS — I248 Other forms of acute ischemic heart disease: Secondary | ICD-10-CM | POA: Diagnosis not present

## 2020-10-21 DIAGNOSIS — E785 Hyperlipidemia, unspecified: Secondary | ICD-10-CM | POA: Diagnosis not present

## 2020-10-21 DIAGNOSIS — M1712 Unilateral primary osteoarthritis, left knee: Secondary | ICD-10-CM | POA: Diagnosis not present

## 2020-10-21 DIAGNOSIS — Z7982 Long term (current) use of aspirin: Secondary | ICD-10-CM | POA: Diagnosis not present

## 2020-10-21 DIAGNOSIS — K219 Gastro-esophageal reflux disease without esophagitis: Secondary | ICD-10-CM | POA: Diagnosis not present

## 2020-10-21 DIAGNOSIS — M109 Gout, unspecified: Secondary | ICD-10-CM | POA: Diagnosis not present

## 2020-10-21 DIAGNOSIS — J4530 Mild persistent asthma, uncomplicated: Secondary | ICD-10-CM | POA: Diagnosis not present

## 2020-10-21 DIAGNOSIS — Z7984 Long term (current) use of oral hypoglycemic drugs: Secondary | ICD-10-CM | POA: Diagnosis not present

## 2020-10-21 DIAGNOSIS — E1142 Type 2 diabetes mellitus with diabetic polyneuropathy: Secondary | ICD-10-CM | POA: Diagnosis not present

## 2020-10-21 DIAGNOSIS — F32A Depression, unspecified: Secondary | ICD-10-CM | POA: Diagnosis not present

## 2020-10-21 DIAGNOSIS — I5032 Chronic diastolic (congestive) heart failure: Secondary | ICD-10-CM | POA: Diagnosis not present

## 2020-10-21 DIAGNOSIS — Z87442 Personal history of urinary calculi: Secondary | ICD-10-CM | POA: Diagnosis not present

## 2020-10-21 MED ORDER — METOPROLOL TARTRATE 50 MG PO TABS: 50.0000 mg | ORAL_TABLET | Freq: Two times a day (BID) | ORAL | 3 refills | Status: DC

## 2020-10-21 NOTE — Addendum Note (Signed)
Addended by: [name] on: 10/21/2020 01:32 PM   Modules accepted: Orders

## 2020-10-21 NOTE — Telephone Encounter (Signed)
1.Medication Requested:metoprolol tartrate (LOPRESSOR) 50 MG tablet   2. Pharmacy (Name, Street, [place]): [id number] - [place], [place]  3. On Med List:   4. Last Visit with PCP: 09-28-20  5. Next visit date with PCP: 03-31-21   Agent: Please be advised that RX refills may take up to 3 business days. We ask that you follow-up with your pharmacy.

## 2020-10-22 ENCOUNTER — Other Ambulatory Visit: Payer: Self-pay | Admitting: Internal Medicine

## 2020-10-22 DIAGNOSIS — K219 Gastro-esophageal reflux disease without esophagitis: Secondary | ICD-10-CM

## 2020-10-26 ENCOUNTER — Telehealth: Payer: Self-pay | Admitting: Internal Medicine

## 2020-10-26 NOTE — Telephone Encounter (Signed)
   [facility] calling to  report patient states she does not need nursing and she already had services for PT via [facility]

## 2020-10-27 NOTE — Telephone Encounter (Signed)
Ok will pass on to [facility]

## 2020-10-29 DIAGNOSIS — E1142 Type 2 diabetes mellitus with diabetic polyneuropathy: Secondary | ICD-10-CM | POA: Diagnosis not present

## 2020-10-29 DIAGNOSIS — E785 Hyperlipidemia, unspecified: Secondary | ICD-10-CM | POA: Diagnosis not present

## 2020-10-29 DIAGNOSIS — M1712 Unilateral primary osteoarthritis, left knee: Secondary | ICD-10-CM | POA: Diagnosis not present

## 2020-10-29 DIAGNOSIS — I248 Other forms of acute ischemic heart disease: Secondary | ICD-10-CM | POA: Diagnosis not present

## 2020-10-29 DIAGNOSIS — I5032 Chronic diastolic (congestive) heart failure: Secondary | ICD-10-CM | POA: Diagnosis not present

## 2020-10-29 DIAGNOSIS — F32A Depression, unspecified: Secondary | ICD-10-CM | POA: Diagnosis not present

## 2020-10-29 DIAGNOSIS — M109 Gout, unspecified: Secondary | ICD-10-CM | POA: Diagnosis not present

## 2020-10-29 DIAGNOSIS — K219 Gastro-esophageal reflux disease without esophagitis: Secondary | ICD-10-CM | POA: Diagnosis not present

## 2020-10-29 DIAGNOSIS — I110 Hypertensive heart disease with heart failure: Secondary | ICD-10-CM | POA: Diagnosis not present

## 2020-10-29 DIAGNOSIS — J4530 Mild persistent asthma, uncomplicated: Secondary | ICD-10-CM | POA: Diagnosis not present

## 2020-10-29 DIAGNOSIS — Z7982 Long term (current) use of aspirin: Secondary | ICD-10-CM | POA: Diagnosis not present

## 2020-10-29 DIAGNOSIS — Z7984 Long term (current) use of oral hypoglycemic drugs: Secondary | ICD-10-CM | POA: Diagnosis not present

## 2020-10-29 DIAGNOSIS — Z87442 Personal history of urinary calculi: Secondary | ICD-10-CM | POA: Diagnosis not present

## 2020-10-29 DIAGNOSIS — I421 Obstructive hypertrophic cardiomyopathy: Secondary | ICD-10-CM | POA: Diagnosis not present

## 2020-11-05 DIAGNOSIS — Z7982 Long term (current) use of aspirin: Secondary | ICD-10-CM | POA: Diagnosis not present

## 2020-11-05 DIAGNOSIS — M1712 Unilateral primary osteoarthritis, left knee: Secondary | ICD-10-CM | POA: Diagnosis not present

## 2020-11-05 DIAGNOSIS — J4530 Mild persistent asthma, uncomplicated: Secondary | ICD-10-CM | POA: Diagnosis not present

## 2020-11-05 DIAGNOSIS — Z87442 Personal history of urinary calculi: Secondary | ICD-10-CM | POA: Diagnosis not present

## 2020-11-05 DIAGNOSIS — K219 Gastro-esophageal reflux disease without esophagitis: Secondary | ICD-10-CM | POA: Diagnosis not present

## 2020-11-05 DIAGNOSIS — I5032 Chronic diastolic (congestive) heart failure: Secondary | ICD-10-CM | POA: Diagnosis not present

## 2020-11-05 DIAGNOSIS — Z7984 Long term (current) use of oral hypoglycemic drugs: Secondary | ICD-10-CM | POA: Diagnosis not present

## 2020-11-05 DIAGNOSIS — I421 Obstructive hypertrophic cardiomyopathy: Secondary | ICD-10-CM | POA: Diagnosis not present

## 2020-11-05 DIAGNOSIS — F32A Depression, unspecified: Secondary | ICD-10-CM | POA: Diagnosis not present

## 2020-11-05 DIAGNOSIS — E785 Hyperlipidemia, unspecified: Secondary | ICD-10-CM | POA: Diagnosis not present

## 2020-11-05 DIAGNOSIS — E1142 Type 2 diabetes mellitus with diabetic polyneuropathy: Secondary | ICD-10-CM | POA: Diagnosis not present

## 2020-11-05 DIAGNOSIS — I248 Other forms of acute ischemic heart disease: Secondary | ICD-10-CM | POA: Diagnosis not present

## 2020-11-05 DIAGNOSIS — I110 Hypertensive heart disease with heart failure: Secondary | ICD-10-CM | POA: Diagnosis not present

## 2020-11-05 DIAGNOSIS — M109 Gout, unspecified: Secondary | ICD-10-CM | POA: Diagnosis not present

## 2020-11-10 DIAGNOSIS — M1712 Unilateral primary osteoarthritis, left knee: Secondary | ICD-10-CM | POA: Diagnosis not present

## 2020-11-10 DIAGNOSIS — I421 Obstructive hypertrophic cardiomyopathy: Secondary | ICD-10-CM | POA: Diagnosis not present

## 2020-11-10 DIAGNOSIS — M109 Gout, unspecified: Secondary | ICD-10-CM | POA: Diagnosis not present

## 2020-11-10 DIAGNOSIS — I5032 Chronic diastolic (congestive) heart failure: Secondary | ICD-10-CM | POA: Diagnosis not present

## 2020-11-10 DIAGNOSIS — I110 Hypertensive heart disease with heart failure: Secondary | ICD-10-CM | POA: Diagnosis not present

## 2020-11-10 DIAGNOSIS — I248 Other forms of acute ischemic heart disease: Secondary | ICD-10-CM | POA: Diagnosis not present

## 2020-11-10 DIAGNOSIS — F32A Depression, unspecified: Secondary | ICD-10-CM | POA: Diagnosis not present

## 2020-11-10 DIAGNOSIS — K219 Gastro-esophageal reflux disease without esophagitis: Secondary | ICD-10-CM | POA: Diagnosis not present

## 2020-11-10 DIAGNOSIS — Z7982 Long term (current) use of aspirin: Secondary | ICD-10-CM | POA: Diagnosis not present

## 2020-11-10 DIAGNOSIS — E785 Hyperlipidemia, unspecified: Secondary | ICD-10-CM | POA: Diagnosis not present

## 2020-11-10 DIAGNOSIS — J4530 Mild persistent asthma, uncomplicated: Secondary | ICD-10-CM | POA: Diagnosis not present

## 2020-11-10 DIAGNOSIS — Z7984 Long term (current) use of oral hypoglycemic drugs: Secondary | ICD-10-CM | POA: Diagnosis not present

## 2020-11-10 DIAGNOSIS — E1142 Type 2 diabetes mellitus with diabetic polyneuropathy: Secondary | ICD-10-CM | POA: Diagnosis not present

## 2020-11-10 DIAGNOSIS — Z87442 Personal history of urinary calculi: Secondary | ICD-10-CM | POA: Diagnosis not present

## 2020-11-17 ENCOUNTER — Other Ambulatory Visit: Payer: Self-pay | Admitting: Internal Medicine

## 2020-11-17 NOTE — Telephone Encounter (Signed)
Please refill as per office routine med refill policy (all routine meds refilled for 3 mo or monthly per pt preference up to one year from last visit, then month to month grace period for 3 mo, then further med refills will have to be denied)  

## 2020-11-22 DIAGNOSIS — I110 Hypertensive heart disease with heart failure: Secondary | ICD-10-CM | POA: Diagnosis not present

## 2020-11-22 DIAGNOSIS — F32A Depression, unspecified: Secondary | ICD-10-CM | POA: Diagnosis not present

## 2020-11-22 DIAGNOSIS — E785 Hyperlipidemia, unspecified: Secondary | ICD-10-CM | POA: Diagnosis not present

## 2020-11-22 DIAGNOSIS — Z87442 Personal history of urinary calculi: Secondary | ICD-10-CM | POA: Diagnosis not present

## 2020-11-22 DIAGNOSIS — M1712 Unilateral primary osteoarthritis, left knee: Secondary | ICD-10-CM | POA: Diagnosis not present

## 2020-11-22 DIAGNOSIS — I421 Obstructive hypertrophic cardiomyopathy: Secondary | ICD-10-CM | POA: Diagnosis not present

## 2020-11-22 DIAGNOSIS — Z7982 Long term (current) use of aspirin: Secondary | ICD-10-CM | POA: Diagnosis not present

## 2020-11-22 DIAGNOSIS — J4530 Mild persistent asthma, uncomplicated: Secondary | ICD-10-CM | POA: Diagnosis not present

## 2020-11-22 DIAGNOSIS — I248 Other forms of acute ischemic heart disease: Secondary | ICD-10-CM | POA: Diagnosis not present

## 2020-11-22 DIAGNOSIS — Z7984 Long term (current) use of oral hypoglycemic drugs: Secondary | ICD-10-CM | POA: Diagnosis not present

## 2020-11-22 DIAGNOSIS — I5032 Chronic diastolic (congestive) heart failure: Secondary | ICD-10-CM | POA: Diagnosis not present

## 2020-11-22 DIAGNOSIS — E1142 Type 2 diabetes mellitus with diabetic polyneuropathy: Secondary | ICD-10-CM | POA: Diagnosis not present

## 2020-11-22 DIAGNOSIS — K219 Gastro-esophageal reflux disease without esophagitis: Secondary | ICD-10-CM | POA: Diagnosis not present

## 2020-11-22 DIAGNOSIS — M109 Gout, unspecified: Secondary | ICD-10-CM | POA: Diagnosis not present

## 2020-12-02 DIAGNOSIS — F32A Depression, unspecified: Secondary | ICD-10-CM | POA: Diagnosis not present

## 2020-12-02 DIAGNOSIS — M1712 Unilateral primary osteoarthritis, left knee: Secondary | ICD-10-CM | POA: Diagnosis not present

## 2020-12-02 DIAGNOSIS — Z7982 Long term (current) use of aspirin: Secondary | ICD-10-CM | POA: Diagnosis not present

## 2020-12-02 DIAGNOSIS — I248 Other forms of acute ischemic heart disease: Secondary | ICD-10-CM | POA: Diagnosis not present

## 2020-12-02 DIAGNOSIS — K219 Gastro-esophageal reflux disease without esophagitis: Secondary | ICD-10-CM | POA: Diagnosis not present

## 2020-12-02 DIAGNOSIS — I5032 Chronic diastolic (congestive) heart failure: Secondary | ICD-10-CM | POA: Diagnosis not present

## 2020-12-02 DIAGNOSIS — M109 Gout, unspecified: Secondary | ICD-10-CM | POA: Diagnosis not present

## 2020-12-02 DIAGNOSIS — Z7984 Long term (current) use of oral hypoglycemic drugs: Secondary | ICD-10-CM | POA: Diagnosis not present

## 2020-12-02 DIAGNOSIS — I421 Obstructive hypertrophic cardiomyopathy: Secondary | ICD-10-CM | POA: Diagnosis not present

## 2020-12-02 DIAGNOSIS — I110 Hypertensive heart disease with heart failure: Secondary | ICD-10-CM | POA: Diagnosis not present

## 2020-12-02 DIAGNOSIS — J4530 Mild persistent asthma, uncomplicated: Secondary | ICD-10-CM | POA: Diagnosis not present

## 2020-12-02 DIAGNOSIS — Z87442 Personal history of urinary calculi: Secondary | ICD-10-CM | POA: Diagnosis not present

## 2020-12-02 DIAGNOSIS — E785 Hyperlipidemia, unspecified: Secondary | ICD-10-CM | POA: Diagnosis not present

## 2020-12-02 DIAGNOSIS — E1142 Type 2 diabetes mellitus with diabetic polyneuropathy: Secondary | ICD-10-CM | POA: Diagnosis not present

## 2020-12-09 DIAGNOSIS — M109 Gout, unspecified: Secondary | ICD-10-CM | POA: Diagnosis not present

## 2020-12-09 DIAGNOSIS — E785 Hyperlipidemia, unspecified: Secondary | ICD-10-CM | POA: Diagnosis not present

## 2020-12-09 DIAGNOSIS — Z7984 Long term (current) use of oral hypoglycemic drugs: Secondary | ICD-10-CM | POA: Diagnosis not present

## 2020-12-09 DIAGNOSIS — I248 Other forms of acute ischemic heart disease: Secondary | ICD-10-CM | POA: Diagnosis not present

## 2020-12-09 DIAGNOSIS — F32A Depression, unspecified: Secondary | ICD-10-CM | POA: Diagnosis not present

## 2020-12-09 DIAGNOSIS — Z7982 Long term (current) use of aspirin: Secondary | ICD-10-CM | POA: Diagnosis not present

## 2020-12-09 DIAGNOSIS — I110 Hypertensive heart disease with heart failure: Secondary | ICD-10-CM | POA: Diagnosis not present

## 2020-12-09 DIAGNOSIS — Z87442 Personal history of urinary calculi: Secondary | ICD-10-CM | POA: Diagnosis not present

## 2020-12-09 DIAGNOSIS — I5032 Chronic diastolic (congestive) heart failure: Secondary | ICD-10-CM | POA: Diagnosis not present

## 2020-12-09 DIAGNOSIS — K219 Gastro-esophageal reflux disease without esophagitis: Secondary | ICD-10-CM | POA: Diagnosis not present

## 2020-12-09 DIAGNOSIS — M1712 Unilateral primary osteoarthritis, left knee: Secondary | ICD-10-CM | POA: Diagnosis not present

## 2020-12-09 DIAGNOSIS — I421 Obstructive hypertrophic cardiomyopathy: Secondary | ICD-10-CM | POA: Diagnosis not present

## 2020-12-09 DIAGNOSIS — J4530 Mild persistent asthma, uncomplicated: Secondary | ICD-10-CM | POA: Diagnosis not present

## 2020-12-09 DIAGNOSIS — E1142 Type 2 diabetes mellitus with diabetic polyneuropathy: Secondary | ICD-10-CM | POA: Diagnosis not present

## 2021-01-04 DIAGNOSIS — H5442A5 Blindness left eye category 5, normal vision right eye: Secondary | ICD-10-CM | POA: Diagnosis not present

## 2021-01-04 DIAGNOSIS — H2589 Other age-related cataract: Secondary | ICD-10-CM | POA: Diagnosis not present

## 2021-01-04 DIAGNOSIS — H43811 Vitreous degeneration, right eye: Secondary | ICD-10-CM | POA: Diagnosis not present

## 2021-01-04 DIAGNOSIS — Z961 Presence of intraocular lens: Secondary | ICD-10-CM | POA: Diagnosis not present

## 2021-01-04 DIAGNOSIS — H40021 Open angle with borderline findings, high risk, right eye: Secondary | ICD-10-CM | POA: Diagnosis not present

## 2021-01-04 LAB — HM DIABETES EYE EXAM

## 2021-01-07 ENCOUNTER — Telehealth: Payer: Self-pay | Admitting: Internal Medicine

## 2021-01-07 NOTE — Telephone Encounter (Signed)
Dr. Sandre Kitty seen pt today. She had some findings that she would like to discuss with PCP. Pt has a long medical history so they would like to verify her medical history as well.   Best callback #: [phone]

## 2021-01-10 ENCOUNTER — Telehealth: Payer: Self-pay | Admitting: Internal Medicine

## 2021-01-10 NOTE — Telephone Encounter (Signed)
See phone note

## 2021-01-10 NOTE — Telephone Encounter (Signed)
Spoke with Dr [facility]  Pt presented there for exam Tue Aug 23, with finding of RIGHT eye branch retinal artery occlusion, but unable to tell chronicity as pt has no symptoms  Pt has not had recent stroke evaluation; but chart review indicates carotid u/s done per Dr [name] Breed cardiology based on concern per optho in dec 2013 of possible retinal involvement causing LEFT eye vision decrease;   May 24 2012 carotid duplex was c/w 1-39% bilateral carotid stenosis only  I can find no mention other evaluation of vision concern since that time;  Staff to contact pt please for new finding of RIGHT retinal branch artery occlusion (unknown chronicity)  - for ROV  Pt will need  Head MRI, repeat carotid evaluation and esr/crp

## 2021-01-11 ENCOUNTER — Encounter: Payer: Self-pay | Admitting: Internal Medicine

## 2021-01-12 NOTE — Telephone Encounter (Signed)
LDVM for pt to call office back for further instructions on next steps due to abnormal findings with her eye exam.

## 2021-01-14 ENCOUNTER — Ambulatory Visit: Payer: Medicare Other | Admitting: Internal Medicine

## 2021-01-14 NOTE — Progress Notes (Signed)
I, [name], LAT, ATC, am serving as scribe for Dr. [name].  Alyssa Ingram is a 75 y.o. female who presents to [facility] today for B knee pain x few weeks w/ no known MOI and for f/u of her R shoulder pain. Pt was previously seen by Dr. Georgina Snell on 10/07/20 for chronic R shoulder pain and had a R GHJ injection. Today, pt locates pain to B ant knees.  Knee swelling: yes Mechanical symptoms: No Aggravates: transitioning from sitting-to-standing; prolonged walking; any attempt at R shoulder AROM above 30-45 deg Treatments tried: nothing other than her normal prescription rx  Dx imaging: 08/06/14 R & L knee XR  Pertinent review of systems: No fevers or chills  Relevant historical information: History right knee replacement.   Exam:  BP 120/82 (BP Location: Left Arm, Patient Position: Sitting, Cuff Size: Normal)   Pulse (!) 55   Ht '5\' 1"'$  (1.549 m)   Wt 189 lb 9.6 oz (86 kg)   SpO2 97%   BMI 35.82 kg/m  General: Well Developed, well nourished, and in no acute distress.   MSK: Right shoulder normal-appearing decreased range of motion. Intact strength.  Left knee mild effusion normal motion with crepitation.  Stable ligamentous exam.     Lab and Radiology Results  Procedure: Real-time Ultrasound Guided Injection of left shoulder glenohumeral joint posterior approach Device: Philips Affiniti 50G Images permanently stored and available for review in PACS Verbal informed consent obtained.  Discussed risks and benefits of procedure. Warned about infection bleeding damage to structures skin hypopigmentation and fat atrophy among others. Patient expresses understanding and agreement Time-out conducted.   Noted no overlying erythema, induration, or other signs of local infection.   Skin prepped in a sterile fashion.   Local anesthesia: Topical Ethyl chloride.   With sterile technique and under real time ultrasound guidance: 40 mg of Kenalog  and 2 mL of Marcaine injected into shoulder joint. Fluid seen entering the joint capsule.   Completed without difficulty   Pain immediately resolved suggesting accurate placement of the medication.   Advised to call if fevers/chills, erythema, induration, drainage, or persistent bleeding.   Images permanently stored and available for review in the ultrasound unit.  Impression: Technically successful ultrasound guided injection.    Procedure: Real-time Ultrasound Guided Injection of left knee superior lateral patellar space Device: Philips Affiniti 50G Images permanently stored and available for review in PACS Verbal informed consent obtained.  Discussed risks and benefits of procedure. Warned about infection bleeding damage to structures skin hypopigmentation and fat atrophy among others. Patient expresses understanding and agreement Time-out conducted.   Noted no overlying erythema, induration, or other signs of local infection.   Skin prepped in a sterile fashion.   Local anesthesia: Topical Ethyl chloride.   With sterile technique and under real time ultrasound guidance: 40 mg of Kenalog and 2 mL of Marcaine injected into knee joint. Fluid seen entering the joint capsule.   Completed without difficulty   Pain immediately resolved suggesting accurate placement of the medication.   Advised to call if fevers/chills, erythema, induration, drainage, or persistent bleeding.   Images permanently stored and available for review in the ultrasound unit.  Impression: Technically successful ultrasound guided injection.         Assessment and Plan: 75 y.o. 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  Plan for repeat steroid injection.  Can repeat this procedure every 3 months if needed.  Continue  home exercise program previously taught.  Left knee pain due to DJD.  Steroid injection today.  Again can repeat this injection every 3 months.  Can inject the left shoulder as soon as 1  week from now.  Recheck then if needed.     PDMP not reviewed this encounter. Orders Placed This Encounter  Procedures   [place] LIMITED JOINT SPACE STRUCTURES LOW BILAT(NO LINKED CHARGES)    Order Specific Question:   Reason for Exam (SYMPTOM  OR DIAGNOSIS REQUIRED)    Answer:   B knee pain    Order Specific Question:   Preferred imaging location?    Answer:   [place]   No orders of the defined types were placed in this encounter.    Discussed warning signs or symptoms. Please see discharge instructions. Patient expresses understanding.   The above documentation has been reviewed and is accurate and complete [name], M.D.

## 2021-01-18 ENCOUNTER — Other Ambulatory Visit: Payer: Self-pay

## 2021-01-18 ENCOUNTER — Other Ambulatory Visit (INDEPENDENT_AMBULATORY_CARE_PROVIDER_SITE_OTHER): Payer: Medicare Other

## 2021-01-18 ENCOUNTER — Ambulatory Visit (INDEPENDENT_AMBULATORY_CARE_PROVIDER_SITE_OTHER): Payer: Medicare Other | Admitting: Family Medicine

## 2021-01-18 ENCOUNTER — Encounter: Payer: Self-pay | Admitting: Internal Medicine

## 2021-01-18 ENCOUNTER — Encounter: Payer: Self-pay | Admitting: Family Medicine

## 2021-01-18 ENCOUNTER — Ambulatory Visit (INDEPENDENT_AMBULATORY_CARE_PROVIDER_SITE_OTHER): Payer: Medicare Other | Admitting: Internal Medicine

## 2021-01-18 ENCOUNTER — Ambulatory Visit: Payer: Self-pay

## 2021-01-18 VITALS — BP 118/72 | HR 52 | Temp 98.0°F | Ht 61.0 in | Wt 189.6 lb

## 2021-01-18 VITALS — BP 120/82 | HR 55 | Ht 61.0 in | Wt 189.6 lb

## 2021-01-18 DIAGNOSIS — E1165 Type 2 diabetes mellitus with hyperglycemia: Secondary | ICD-10-CM

## 2021-01-18 DIAGNOSIS — M25562 Pain in left knee: Secondary | ICD-10-CM | POA: Diagnosis not present

## 2021-01-18 DIAGNOSIS — E7800 Pure hypercholesterolemia, unspecified: Secondary | ICD-10-CM

## 2021-01-18 DIAGNOSIS — H34231 Retinal artery branch occlusion, right eye: Secondary | ICD-10-CM

## 2021-01-18 DIAGNOSIS — G8929 Other chronic pain: Secondary | ICD-10-CM

## 2021-01-18 DIAGNOSIS — M25561 Pain in right knee: Secondary | ICD-10-CM | POA: Diagnosis not present

## 2021-01-18 DIAGNOSIS — H34239 Retinal artery branch occlusion, unspecified eye: Secondary | ICD-10-CM | POA: Insufficient documentation

## 2021-01-18 DIAGNOSIS — I10 Essential (primary) hypertension: Secondary | ICD-10-CM

## 2021-01-18 DIAGNOSIS — M25511 Pain in right shoulder: Secondary | ICD-10-CM | POA: Diagnosis not present

## 2021-01-18 MED ORDER — MECLIZINE HCL 25 MG PO TABS: ORAL_TABLET | ORAL | 5 refills | Status: AC

## 2021-01-18 MED ORDER — GLIPIZIDE ER 2.5 MG PO TB24: 2.5000 mg | ORAL_TABLET | Freq: Every day | ORAL | 2 refills | Status: DC

## 2021-01-18 NOTE — Assessment & Plan Note (Signed)
D/w son with her today - for MRI brain, carotid study, and esr/crp with labs, f/u with retinal specialist next mo as planned,  to f/u any worsening symptoms or concerns

## 2021-01-18 NOTE — Assessment & Plan Note (Signed)
BP Readings from Last 3 Encounters:  01/18/21 118/72  01/18/21 120/82  10/07/20 104/64   Stable, pt to continue medical treatment lopressor, cardizem

## 2021-01-18 NOTE — Patient Instructions (Addendum)
Please continue all other medications as before, and refills have been done if requested.  Please have the pharmacy call with any other refills you may need.  Please continue your efforts at being more active, low cholesterol diet, and weight control.  You are otherwise up to date with prevention measures today.  Please keep your appointments with your specialists as you may have planned  You will be contacted regarding the referral for: carotid artery testing, MRI brain  Please go to the LAB at the blood drawing area for the tests to be done  You will be contacted by phone if any changes need to be made immediately.  Otherwise, you will receive a letter about your results with an explanation, but please check with MyChart first.  Please remember to sign up for MyChart if you have not done so, as this will be important to you in the future with finding out test results, communicating by private email, and scheduling acute appointments online when needed.

## 2021-01-18 NOTE — Assessment & Plan Note (Addendum)
Lab Results  Component Value Date   LDLCALC 174 (H) 09/28/2020   Uncontrolled with recent noncompliance,, pt to continue current statin lipitor 80, and f/u lab

## 2021-01-18 NOTE — Patient Instructions (Signed)
Good to see you today.  You had a R shoulder and L knee injection today.  Call or go to the ER if you develop a large red swollen joint with extreme pain or oozing puss.   Follow-up as needed.  I can inject your R knee as soon as one week from today.

## 2021-01-18 NOTE — Progress Notes (Signed)
Patient ID: Alyssa Ingram, female   DOB: May 19, 1945, 75 y.o.   MRN: [id number]        Chief Complaint: follow up right retinal branch artery occlusion per optho       HPI:  Alyssa Ingram is a 75 y.o. female here after I recently spoke with Dr Alyssa Ingram [facility];  Pt presented there for exam Tue Aug 23, with finding of RIGHT eye branch retinal artery occlusion, but unable to tell chronicity as pt has no symptoms;  Pt has not had recent stroke evaluation; but chart review indicates carotid u/s done per Dr Alyssa Ingram cardiology based on concern per optho in dec 2013 of possible retinal involvement causing LEFT eye vision decrease; also on  May 24 2012 carotid duplex was c/w 1-39% bilateral carotid stenosis only; pt denies HA, vision change.  Pt denies chest pain, increased sob or doe, wheezing, orthopnea, PND, increased LE swelling, palpitations, dizziness or syncope.   Pt denies polydipsia, polyuria, or new focal neuro s/s.   Pt denies fever, night sweats, loss of appetite, or other constitutional symptoms , though has losts significant wt with less appetite.     Wt Readings from Last 3 Encounters:  01/18/21 189 lb 9.6 oz (86 kg)  01/18/21 189 lb 9.6 oz (86 kg)  07/27/20 274 lb 7.6 oz (124.5 kg)   BP Readings from Last 3 Encounters:  01/18/21 118/72  01/18/21 120/82  10/07/20 104/64         Past Medical History:  Diagnosis Date   Abnormal MRI, spine 11/2011   ?discitis L5-S1, s/p CT bx 12/12/11   Allergic rhinitis, cause unspecified 01/31/2013   Arthritis    Asthma    Cervical cancer (Alyssa Ingram)    Chronic diastolic heart failure (HCC)    Diabetic retinopathy (HCC)    GERD (gastroesophageal reflux disease)    H/O cardiovascular stress test    Nuclear study in 2011 normal perfusion   HOCM (hypertrophic obstructive cardiomyopathy) ([place])    Echo 12/19/11: Severe LVH, EF 55-65%, dynamic obstruction, wall motion normal, grade 1 diastolic dysfunction, systolic anterior motion of the mitral  valve with mild MS and mild MR, mild LAE, PASP 34   Hyperlipidemia    Hypertension    Kidney stones    Obesity    Type II or unspecified type diabetes mellitus without mention of complication, uncontrolled 01/31/2013   Past Surgical History:  Procedure Laterality Date   ABDOMINAL HYSTERECTOMY     JOINT REPLACEMENT     right knee 2006   rotater cuff     right, 2005    reports that she has never smoked. She has never used smokeless tobacco. She reports that she does not drink alcohol and does not use drugs. family history includes Alcohol abuse in an other family member; Arthritis in some other family members; Cancer in an other family member; Diabetes in some other family members; Heart disease in her father and other family members; Hypertension in some other family members; Kidney disease in an other family member; Mental illness in an other family member; Sudden death in an other family member. Allergies  Allergen Reactions   Sulfa Antibiotics Hives   Metformin And Related    Other Hives    Unknown drug   Prednisone Swelling   Current Outpatient Medications on File Prior to Visit  Medication Sig Dispense Refill   albuterol (ACCUNEB) 0.63 MG/3ML nebulizer solution INHALE 1 VIAL BY MOUTH VIA NEBULIZATION EVERY 6 HOURS AS  NEEDED FOR WHEEZING. 75 mL 12   albuterol (PROAIR HFA) 108 (90 Base) MCG/ACT inhaler Inhale 2 puffs into the lungs 2 (two) times daily. (Patient taking differently: Inhale 2 puffs into the lungs 2 (two) times daily. May repeat if needed) 8.5 Inhaler 2   allopurinol (ZYLOPRIM) 100 MG tablet Take 1 tablet (100 mg total) by mouth daily. 90 tablet 3   ammonium lactate (AMLACTIN) 12 % cream APPLY TOPICALLY AS NEEDED FOR DRY SKIN. 385 g 0   aspirin 81 MG tablet Take 162 mg by mouth daily.      atorvastatin (LIPITOR) 80 MG tablet Take 1 tablet (80 mg total) by mouth daily. 90 tablet 3   Blood Glucose Monitoring Suppl (ONE TOUCH ULTRA 2) w/Device KIT Use as directed three  times daily E11.9 1 kit 0   cetirizine (ZYRTEC) 10 MG tablet TAKE 1 TABLET BY MOUTH EVERY DAY AS NEEDED 90 tablet 1   Continuous Blood Gluc Receiver (FREESTYLE LIBRE READER) DEVI Apply 1 Device topically 4 (four) times daily as needed. E11.9 1 each 0   Continuous Blood Gluc Sensor (FREESTYLE LIBRE 14 DAY SENSOR) MISC Apply 1 Device topically every 14 (fourteen) days. E11.9 6 each 3   DULoxetine (CYMBALTA) 60 MG capsule Take 1 capsule (60 mg total) by mouth daily. 90 capsule 3   furosemide (LASIX) 20 MG tablet TAKE 3 TABLETS (60 MG TOTAL) BY MOUTH 2 TIMES DAILY. 180 tablet 6   glucose blood (ONETOUCH ULTRA) test strip Use as instructed three times daily E11.9 300 each 12   Lancets MISC Use as directed three times daily E11.9 300 each 3   lidocaine (LIDODERM) 5 % Place 1 patch onto the skin daily. Remove & Discard patch within 12 hours or as directed by MD 40 patch 1   metoprolol tartrate (LOPRESSOR) 50 MG tablet Take 1 tablet (50 mg total) by mouth 2 (two) times daily. 180 tablet 3   pantoprazole (PROTONIX) 40 MG tablet TAKE 1 TABLET BY MOUTH EVERY DAY 90 tablet 3   triamcinolone (NASACORT AQ) 55 MCG/ACT AERO nasal inhaler Place 2 sprays into the nose daily. 3 Inhaler 3   diltiazem (CARDIZEM CD) 120 MG 24 hr capsule Take 1 capsule (120 mg total) by mouth daily. 30 capsule 0   gabapentin (NEURONTIN) 100 MG capsule Take 2 capsules (200 mg total) by mouth 2 (two) times daily. 120 capsule 0   No current facility-administered medications on file prior to visit.        ROS:  All others reviewed and negative.  Objective        PE:  BP 118/72 (BP Location: Right Arm, Patient Position: Sitting, Cuff Size: Large)   Pulse (!) 52   Temp 98 F (36.7 C) (Oral)   Ht '5\' 1"'  (1.549 m)   Wt 189 lb 9.6 oz (86 kg)   SpO2 97%   BMI 35.82 kg/m                 Constitutional: Pt appears in NAD               HENT: Head: NCAT.                Right Ear: External ear normal.                 Left Ear: External  ear normal.                Eyes: . Pupils are equal, round, and reactive  to light. Conjunctivae and EOM are normal               Nose: without d/c or deformity               Neck: Neck supple. Gross normal ROM               Cardiovascular: Normal rate and regular rhythm.                 Pulmonary/Chest: Effort normal and breath sounds without rales or wheezing.                Abd:  Soft, NT, ND, + BS, no organomegaly               Neurological: Pt is alert. At baseline orientation, motor grossly intact               Skin: Skin is warm. No rashes, no other new lesions, LE edema - none               Psychiatric: Pt behavior is normal without agitation   Micro: none  Cardiac tracings I have personally interpreted today:  none  Pertinent Radiological findings (summarize): none   Lab Results  Component Value Date   WBC 8.2 09/28/2020   HGB 12.1 09/28/2020   HCT 37.2 09/28/2020   PLT 301.0 09/28/2020   GLUCOSE 105 (H) 09/28/2020   CHOL 255 (H) 09/28/2020   TRIG 133.0 09/28/2020   HDL 54.50 09/28/2020   LDLCALC 174 (H) 09/28/2020   ALT 9 09/28/2020   AST 12 09/28/2020   NA 142 09/28/2020   K 4.0 09/28/2020   CL 101 09/28/2020   CREATININE 0.92 09/28/2020   BUN 27 (H) 09/28/2020   CO2 31 09/28/2020   TSH 0.49 09/28/2020   INR 1.03 12/12/2011   HGBA1C 6.2 09/28/2020   MICROALBUR 3.9 (H) 09/28/2020   Assessment/Plan:  Alyssa Ingram is a 75 y.o. Declined [7] female with  has a past medical history of Abnormal MRI, spine (11/2011), Allergic rhinitis, cause unspecified (01/31/2013), Arthritis, Asthma, Cervical cancer ([place]), Chronic diastolic heart failure ([place]), Diabetic retinopathy ([place]), GERD (gastroesophageal reflux disease), H/O cardiovascular stress test, HOCM (hypertrophic obstructive cardiomyopathy) ([place]), Hyperlipidemia, Hypertension, Kidney stones, Obesity, and Type II or unspecified type diabetes mellitus without mention of complication, uncontrolled (01/31/2013).  Retinal  artery occlusion, branch D/w son with her today - for MRI brain, carotid study, and esr/crp with labs, f/u with retinal specialist next mo as planned,  to f/u any worsening symptoms or concerns  Diabetes [facility]) Lab Results  Component Value Date   HGBA1C 6.2 09/28/2020   Stable, pt to continue current medical treatment glucotrol xl 2.5   Hypertension BP Readings from Last 3 Encounters:  01/18/21 118/72  01/18/21 120/82  10/07/20 104/64   Stable, pt to continue medical treatment lopressor, cardizem   HLD (hyperlipidemia) Lab Results  Component Value Date   LDLCALC 174 (H) 09/28/2020   Uncontrolled with recent noncompliance,, pt to continue current statin lipitor 80, and f/u lab  Followup: Return in about 2 months (around 03/31/2021).  Cathlean Cower, MD 01/18/2021 9:00 PM [facility]

## 2021-01-18 NOTE — Assessment & Plan Note (Signed)
Lab Results  Component Value Date   HGBA1C 6.2 09/28/2020   Stable, pt to continue current medical treatment glucotrol xl 2.5

## 2021-01-19 LAB — LIPID PANEL
Cholesterol: 191 mg/dL (ref 0–200)
HDL: 57 mg/dL (ref >39.00)
LDL Cholesterol: 115 mg/dL — ABNORMAL HIGH (ref 0–99)
NonHDL: 134.27
Total CHOL/HDL Ratio: 3
Triglycerides: 98 mg/dL (ref 0.0–149.0)
VLDL: 19.6 mg/dL (ref 0.0–40.0)

## 2021-01-19 LAB — HEPATIC FUNCTION PANEL
ALT: 9 U/L (ref 0–35)
AST: 13 U/L (ref 0–37)
Albumin: 4.1 g/dL (ref 3.5–5.2)
Alkaline Phosphatase: 123 U/L — ABNORMAL HIGH (ref 39–117)
Bilirubin, Direct: 0.1 mg/dL (ref 0.0–0.3)
Total Bilirubin: 0.7 mg/dL (ref 0.2–1.2)
Total Protein: 8.1 g/dL (ref 6.0–8.3)

## 2021-01-19 LAB — BASIC METABOLIC PANEL
BUN: 21 mg/dL (ref 6–23)
CO2: 29 mEq/L (ref 19–32)
Calcium: 9.7 mg/dL (ref 8.4–10.5)
Chloride: 99 mEq/L (ref 96–112)
Creatinine, Ser: 0.84 mg/dL (ref 0.40–1.20)
GFR: 67.99 mL/min (ref >60.00)
Glucose, Bld: 141 mg/dL — ABNORMAL HIGH (ref 70–99)
Potassium: 4 mEq/L (ref 3.5–5.1)
Sodium: 140 mEq/L (ref 135–145)

## 2021-01-19 LAB — C-REACTIVE PROTEIN: CRP: <1 mg/dL (ref 0.5–20.0)

## 2021-01-19 LAB — SEDIMENTATION RATE: Sed Rate: 27 mm/hr (ref 0–30)

## 2021-01-19 LAB — HEMOGLOBIN A1C: Hgb A1c MFr Bld: 7.3 % — ABNORMAL HIGH (ref 4.6–6.5)

## 2021-01-20 ENCOUNTER — Encounter: Payer: Self-pay | Admitting: Internal Medicine

## 2021-01-24 NOTE — Progress Notes (Deleted)
   I, Peterson Lombard, LAT, ATC acting as a scribe for [name], MD.  Alyssa Ingram is a 75 y.o. female who presents to [place] at [facility] today for bilat chronic knee pain. Pt was last seen by Dr. Georgina Snell on 01/18/21 and was given a L knee and L GH steroid injections and was advised to return as soon as 1 week to injection the R knee. Today, pt reports   Dx imaging: 08/06/14 R & L knee XR  Pertinent review of systems: ***  Relevant historical information: ***   Exam:  There were no vitals taken for this visit. General: Well Developed, well nourished, and in no acute distress.   MSK: ***    Lab and Radiology Results No results found for this or any previous visit (from the past 72 hour(s)). No results found.     Assessment and Plan: 75 y.o. female with ***   PDMP not reviewed this encounter. No orders of the defined types were placed in this encounter.  No orders of the defined types were placed in this encounter.    Discussed warning signs or symptoms. Please see discharge instructions. Patient expresses understanding.   ***

## 2021-01-25 ENCOUNTER — Ambulatory Visit: Payer: Medicare Other | Admitting: Family Medicine

## 2021-02-01 ENCOUNTER — Telehealth: Payer: Self-pay

## 2021-02-01 NOTE — Telephone Encounter (Signed)
Please advise as the pt has stated she has a persistent productive cough a/w nasal and chest congestion x1 week. Pt is asking for something to be sent in for her sxs.

## 2021-02-03 NOTE — Telephone Encounter (Signed)
Pt can use over-the-counter  "cold" medicines  such as "Afrin" nasal spray for nasal congestion as directed. Use " Delsym" or" Robitussin" cough syrup varietis for cough.  You can use plain "Tylenol" or "Advil" for fever, chills and achyness. Use Halls or Ricola cough drops  Please, make an appointment if not better or if  worse. Thx

## 2021-02-03 NOTE — Telephone Encounter (Signed)
Tried calling pt to inform her of Dr. Judeen Hammans instructions. Phone rang out unable to leave vm.

## 2021-02-07 ENCOUNTER — Encounter: Payer: Self-pay | Admitting: Internal Medicine

## 2021-02-17 ENCOUNTER — Other Ambulatory Visit: Payer: Self-pay | Admitting: Internal Medicine

## 2021-03-21 ENCOUNTER — Other Ambulatory Visit: Payer: Self-pay | Admitting: Internal Medicine

## 2021-03-31 ENCOUNTER — Ambulatory Visit: Payer: Medicare Other | Admitting: Internal Medicine

## 2021-05-10 ENCOUNTER — Other Ambulatory Visit: Payer: Self-pay | Admitting: Internal Medicine

## 2021-05-25 ENCOUNTER — Telehealth: Payer: Self-pay | Admitting: Family Medicine

## 2021-05-25 NOTE — Telephone Encounter (Signed)
Diltiazem is a blood pressure medicine that I have not prescribed and should not make a difference for your back pain.  I recommend that you contact your primary care provider office for refill of this medication.  I am happy to see you for your back pain.   Ellard Artis

## 2021-05-25 NOTE — Telephone Encounter (Signed)
Patient called requesting a refill on medication due to pain. She said that she is having a lot of pain in her shoulder that goes to her back.  I asked her which medication she needed refilled and she said that it is the diltiazem (CARDIZEM CD) 180 MG 24 hr capsule.  Is this something that Dr Georgina Snell has or would prescribe for her? I told her that this was sent in by Dr Jenny Reichmann but she thinks this is something Dr Georgina Snell has sent in.  (I referred her to speak to Dr Gwynn Burly office regarding this medication but she called back asking for it to come from [place] for the pain.)  Please advise.

## 2021-05-26 NOTE — Telephone Encounter (Signed)
Please refill as per office routine med refill policy (all routine meds to be refilled for 3 mo or monthly (per pt preference) up to one year from last visit, then month to month grace period for 3 mo, then further med refills will have to be denied) ? ?

## 2021-05-26 NOTE — Telephone Encounter (Signed)
Left message for patient to call our office back. Prescription was sent 05/10/21 for #90 w/2 refills. Patient should contact her pharmacy.

## 2021-05-31 ENCOUNTER — Ambulatory Visit: Payer: Medicare Other | Admitting: Internal Medicine

## 2021-06-03 ENCOUNTER — Ambulatory Visit: Payer: Medicare Other | Admitting: Family Medicine

## 2021-06-03 NOTE — Progress Notes (Deleted)
° °  I, Peterson Lombard, LAT, ATC acting as a scribe for [name], MD.  Alyssa Ingram is a 76 y.o. female who presents to [place] at [facility] today for continued R knee and R shoulder pain. Pt was last seen by Dr. Georgina Snell on 01/18/21 and was given a L GH and L knee steroid injections and was advised to cont HEP. Pt's last R GH steroid injection was performed on 10/07/20. Today, pt reports  Dx imaging: 08/06/14 R & L knee XR  06/14/12 R shoulder XR  Pertinent review of systems: ***  Relevant historical information: ***   Exam:  There were no vitals taken for this visit. General: Well Developed, well nourished, and in no acute distress.   MSK: ***    Lab and Radiology Results No results found for this or any previous visit (from the past 72 hour(s)). No results found.     Assessment and Plan: 76 y.o. female with ***   PDMP not reviewed this encounter. No orders of the defined types were placed in this encounter.  No orders of the defined types were placed in this encounter.    Discussed warning signs or symptoms. Please see discharge instructions. Patient expresses understanding.   ***

## 2021-06-07 ENCOUNTER — Ambulatory Visit: Payer: Medicare Other | Admitting: Internal Medicine

## 2021-06-17 ENCOUNTER — Ambulatory Visit: Payer: Medicare Other | Admitting: Sports Medicine

## 2021-06-17 NOTE — Progress Notes (Deleted)
Benito Mccreedy D.[place] Phone: [phone]   Assessment and Plan:     There are no diagnoses linked to this encounter.  ***   Pertinent previous records reviewed include ***   Follow Up: ***     Subjective:   I, Sherrika Weakland, am serving as a Education administrator for Doctor [employer] Chief Complaint: knee pain   HPI:  Alyssa Ingram is a 76 y.o. female who presents to [place] at [facility] today for B knee pain x few weeks w/ no known MOI and for f/u of her R shoulder pain. Pt was previously seen by Dr. Georgina Snell on 10/07/20 for chronic R shoulder pain and had a R GHJ injection. Today, pt locates pain to B ant knees.   Knee swelling: yes Mechanical symptoms: No Aggravates: transitioning from sitting-to-standing; prolonged walking; any attempt at R shoulder AROM above 30-45 deg Treatments tried: nothing other than her normal prescription rx   Dx imaging: 08/06/14 R & L knee XR  06/17/2021 Patient states    Pertinent review of systems: No fevers or chills   Relevant historical information: History right knee replacement.  Additional pertinent review of systems negative.   Current Outpatient Medications:    albuterol (ACCUNEB) 0.63 MG/3ML nebulizer solution, INHALE 1 VIAL BY MOUTH VIA NEBULIZATION EVERY 6 HOURS AS NEEDED FOR WHEEZING., Disp: 75 mL, Rfl: 12   albuterol (PROAIR HFA) 108 (90 Base) MCG/ACT inhaler, Inhale 2 puffs into the lungs 2 (two) times daily. (Patient taking differently: Inhale 2 puffs into the lungs 2 (two) times daily. May repeat if needed), Disp: 8.5 Inhaler, Rfl: 2   allopurinol (ZYLOPRIM) 100 MG tablet, Take 1 tablet (100 mg total) by mouth daily., Disp: 90 tablet, Rfl: 3   ammonium lactate (AMLACTIN) 12 % cream, APPLY TOPICALLY AS NEEDED FOR DRY SKIN., Disp: 385 g, Rfl: 0   aspirin 81 MG tablet, Take 162 mg by mouth daily. , Disp: , Rfl:    atorvastatin (LIPITOR)  80 MG tablet, Take 1 tablet (80 mg total) by mouth daily., Disp: 90 tablet, Rfl: 3   Blood Glucose Monitoring Suppl (ONE TOUCH ULTRA 2) w/Device KIT, Use as directed three times daily E11.9, Disp: 1 kit, Rfl: 0   cetirizine (ZYRTEC) 10 MG tablet, TAKE 1 TABLET BY MOUTH EVERY DAY AS NEEDED, Disp: 90 tablet, Rfl: 1   Continuous Blood Gluc Receiver (FREESTYLE LIBRE READER) DEVI, Apply 1 Device topically 4 (four) times daily as needed. E11.9, Disp: 1 each, Rfl: 0   Continuous Blood Gluc Sensor (FREESTYLE LIBRE 14 DAY SENSOR) MISC, Apply 1 Device topically every 14 (fourteen) days. E11.9, Disp: 6 each, Rfl: 3   diltiazem (CARDIZEM CD) 120 MG 24 hr capsule, Take 1 capsule (120 mg total) by mouth daily., Disp: 30 capsule, Rfl: 0   diltiazem (CARDIZEM CD) 180 MG 24 hr capsule, TAKE 1 CAPSULE BY MOUTH EVERY DAY, Disp: 90 capsule, Rfl: 2   DULoxetine (CYMBALTA) 60 MG capsule, Take 1 capsule (60 mg total) by mouth daily., Disp: 90 capsule, Rfl: 3   furosemide (LASIX) 20 MG tablet, TAKE 3 TABLETS (60 MG TOTAL) BY MOUTH 2 TIMES DAILY., Disp: 540 tablet, Rfl: 1   gabapentin (NEURONTIN) 100 MG capsule, Take 2 capsules (200 mg total) by mouth 2 (two) times daily., Disp: 120 capsule, Rfl: 0   glipiZIDE (GLUCOTROL XL) 2.5 MG 24 hr tablet, Take 1 tablet (2.5 mg total) by mouth daily  with breakfast., Disp: 90 tablet, Rfl: 2   glucose blood (ONETOUCH ULTRA) test strip, Use as instructed three times daily E11.9, Disp: 300 each, Rfl: 12   Lancets MISC, Use as directed three times daily E11.9, Disp: 300 each, Rfl: 3   lidocaine (LIDODERM) 5 %, Place 1 patch onto the skin daily. Remove & Discard patch within 12 hours or as directed by MD, Disp: 40 patch, Rfl: 1   meclizine (ANTIVERT) 25 MG tablet, TAKE 1 TABLET BY MOUTH THREE TIMES A DAY as needed, Disp: 30 tablet, Rfl: 5   metoprolol tartrate (LOPRESSOR) 50 MG tablet, Take 1 tablet (50 mg total) by mouth 2 (two) times daily., Disp: 180 tablet, Rfl: 3   pantoprazole  (PROTONIX) 40 MG tablet, TAKE 1 TABLET BY MOUTH EVERY DAY, Disp: 90 tablet, Rfl: 3   triamcinolone (NASACORT AQ) 55 MCG/ACT AERO nasal inhaler, Place 2 sprays into the nose daily., Disp: 3 Inhaler, Rfl: 3   Objective:     There were no vitals filed for this visit.    There is no height or weight on file to calculate BMI.    Physical Exam:    ***   Electronically signed by:  Benito Mccreedy D.[facility] 9:35 AM 06/17/21

## 2021-06-19 ENCOUNTER — Encounter (HOSPITAL_COMMUNITY): Payer: Self-pay | Admitting: Emergency Medicine

## 2021-06-19 ENCOUNTER — Other Ambulatory Visit: Payer: Self-pay

## 2021-06-19 ENCOUNTER — Inpatient Hospital Stay (HOSPITAL_COMMUNITY): Payer: Medicare Other

## 2021-06-19 ENCOUNTER — Emergency Department (HOSPITAL_COMMUNITY): Payer: Medicare Other

## 2021-06-19 ENCOUNTER — Inpatient Hospital Stay (HOSPITAL_COMMUNITY)
Admission: EM | Admit: 2021-06-19 | Discharge: 2021-06-26 | DRG: 291 | Disposition: A | Discharge status: Home / Self Care | Payer: Medicare Other | Attending: Internal Medicine | Admitting: Internal Medicine

## 2021-06-19 DIAGNOSIS — Z8541 Personal history of malignant neoplasm of cervix uteri: Secondary | ICD-10-CM

## 2021-06-19 DIAGNOSIS — I050 Rheumatic mitral stenosis: Secondary | ICD-10-CM | POA: Diagnosis not present

## 2021-06-19 DIAGNOSIS — K219 Gastro-esophageal reflux disease without esophagitis: Secondary | ICD-10-CM | POA: Diagnosis not present

## 2021-06-19 DIAGNOSIS — I5033 Acute on chronic diastolic (congestive) heart failure: Secondary | ICD-10-CM

## 2021-06-19 DIAGNOSIS — R32 Unspecified urinary incontinence: Secondary | ICD-10-CM | POA: Diagnosis present

## 2021-06-19 DIAGNOSIS — J9601 Acute respiratory failure with hypoxia: Secondary | ICD-10-CM | POA: Diagnosis not present

## 2021-06-19 DIAGNOSIS — Z801 Family history of malignant neoplasm of trachea, bronchus and lung: Secondary | ICD-10-CM | POA: Diagnosis not present

## 2021-06-19 DIAGNOSIS — I890 Lymphedema, not elsewhere classified: Secondary | ICD-10-CM | POA: Diagnosis present

## 2021-06-19 DIAGNOSIS — M19011 Primary osteoarthritis, right shoulder: Secondary | ICD-10-CM | POA: Diagnosis not present

## 2021-06-19 DIAGNOSIS — I878 Other specified disorders of veins: Secondary | ICD-10-CM | POA: Diagnosis present

## 2021-06-19 DIAGNOSIS — Z79899 Other long term (current) drug therapy: Secondary | ICD-10-CM | POA: Diagnosis not present

## 2021-06-19 DIAGNOSIS — I872 Venous insufficiency (chronic) (peripheral): Secondary | ICD-10-CM | POA: Diagnosis not present

## 2021-06-19 DIAGNOSIS — E1165 Type 2 diabetes mellitus with hyperglycemia: Secondary | ICD-10-CM | POA: Diagnosis not present

## 2021-06-19 DIAGNOSIS — Z7982 Long term (current) use of aspirin: Secondary | ICD-10-CM

## 2021-06-19 DIAGNOSIS — M25711 Osteophyte, right shoulder: Secondary | ICD-10-CM | POA: Diagnosis not present

## 2021-06-19 DIAGNOSIS — I493 Ventricular premature depolarization: Secondary | ICD-10-CM | POA: Diagnosis present

## 2021-06-19 DIAGNOSIS — E11319 Type 2 diabetes mellitus with unspecified diabetic retinopathy without macular edema: Secondary | ICD-10-CM | POA: Diagnosis present

## 2021-06-19 DIAGNOSIS — I421 Obstructive hypertrophic cardiomyopathy: Secondary | ICD-10-CM | POA: Diagnosis present

## 2021-06-19 DIAGNOSIS — I110 Hypertensive heart disease with heart failure: Secondary | ICD-10-CM | POA: Diagnosis not present

## 2021-06-19 DIAGNOSIS — M25511 Pain in right shoulder: Secondary | ICD-10-CM | POA: Diagnosis present

## 2021-06-19 DIAGNOSIS — E119 Type 2 diabetes mellitus without complications: Secondary | ICD-10-CM

## 2021-06-19 DIAGNOSIS — I10 Essential (primary) hypertension: Secondary | ICD-10-CM | POA: Diagnosis present

## 2021-06-19 DIAGNOSIS — Z8249 Family history of ischemic heart disease and other diseases of the circulatory system: Secondary | ICD-10-CM

## 2021-06-19 DIAGNOSIS — G629 Polyneuropathy, unspecified: Secondary | ICD-10-CM

## 2021-06-19 DIAGNOSIS — Z90710 Acquired absence of both cervix and uterus: Secondary | ICD-10-CM

## 2021-06-19 DIAGNOSIS — R6889 Other general symptoms and signs: Secondary | ICD-10-CM | POA: Diagnosis not present

## 2021-06-19 DIAGNOSIS — I517 Cardiomegaly: Secondary | ICD-10-CM | POA: Diagnosis not present

## 2021-06-19 DIAGNOSIS — I272 Pulmonary hypertension, unspecified: Secondary | ICD-10-CM | POA: Diagnosis present

## 2021-06-19 DIAGNOSIS — Z7984 Long term (current) use of oral hypoglycemic drugs: Secondary | ICD-10-CM

## 2021-06-19 DIAGNOSIS — Z881 Allergy status to other antibiotic agents status: Secondary | ICD-10-CM

## 2021-06-19 DIAGNOSIS — Z20822 Contact with and (suspected) exposure to covid-19: Secondary | ICD-10-CM | POA: Diagnosis present

## 2021-06-19 DIAGNOSIS — R609 Edema, unspecified: Secondary | ICD-10-CM | POA: Diagnosis not present

## 2021-06-19 DIAGNOSIS — E876 Hypokalemia: Secondary | ICD-10-CM | POA: Diagnosis not present

## 2021-06-19 DIAGNOSIS — M1712 Unilateral primary osteoarthritis, left knee: Secondary | ICD-10-CM | POA: Diagnosis present

## 2021-06-19 DIAGNOSIS — E7800 Pure hypercholesterolemia, unspecified: Secondary | ICD-10-CM | POA: Diagnosis not present

## 2021-06-19 DIAGNOSIS — Z833 Family history of diabetes mellitus: Secondary | ICD-10-CM

## 2021-06-19 DIAGNOSIS — R0689 Other abnormalities of breathing: Secondary | ICD-10-CM | POA: Diagnosis not present

## 2021-06-19 DIAGNOSIS — Y92009 Unspecified place in unspecified non-institutional (private) residence as the place of occurrence of the external cause: Secondary | ICD-10-CM | POA: Diagnosis not present

## 2021-06-19 DIAGNOSIS — D649 Anemia, unspecified: Secondary | ICD-10-CM | POA: Diagnosis not present

## 2021-06-19 DIAGNOSIS — Z91128 Patient's intentional underdosing of medication regimen for other reason: Secondary | ICD-10-CM

## 2021-06-19 DIAGNOSIS — E785 Hyperlipidemia, unspecified: Secondary | ICD-10-CM | POA: Diagnosis present

## 2021-06-19 DIAGNOSIS — E1142 Type 2 diabetes mellitus with diabetic polyneuropathy: Secondary | ICD-10-CM | POA: Diagnosis present

## 2021-06-19 DIAGNOSIS — Z888 Allergy status to other drugs, medicaments and biological substances status: Secondary | ICD-10-CM

## 2021-06-19 DIAGNOSIS — I509 Heart failure, unspecified: Secondary | ICD-10-CM

## 2021-06-19 DIAGNOSIS — L03116 Cellulitis of left lower limb: Secondary | ICD-10-CM | POA: Diagnosis not present

## 2021-06-19 DIAGNOSIS — Z91119 Patient's noncompliance with dietary regimen due to unspecified reason: Secondary | ICD-10-CM

## 2021-06-19 DIAGNOSIS — R062 Wheezing: Secondary | ICD-10-CM | POA: Diagnosis not present

## 2021-06-19 DIAGNOSIS — R296 Repeated falls: Secondary | ICD-10-CM | POA: Diagnosis not present

## 2021-06-19 DIAGNOSIS — E559 Vitamin D deficiency, unspecified: Secondary | ICD-10-CM | POA: Diagnosis not present

## 2021-06-19 DIAGNOSIS — R0602 Shortness of breath: Secondary | ICD-10-CM | POA: Diagnosis not present

## 2021-06-19 DIAGNOSIS — Z96651 Presence of right artificial knee joint: Secondary | ICD-10-CM | POA: Diagnosis present

## 2021-06-19 DIAGNOSIS — Z87442 Personal history of urinary calculi: Secondary | ICD-10-CM

## 2021-06-19 DIAGNOSIS — T501X6A Underdosing of loop [high-ceiling] diuretics, initial encounter: Secondary | ICD-10-CM | POA: Diagnosis present

## 2021-06-19 DIAGNOSIS — Z743 Need for continuous supervision: Secondary | ICD-10-CM | POA: Diagnosis not present

## 2021-06-19 LAB — IRON AND TIBC
Iron: 26 ug/dL — ABNORMAL LOW (ref 28–170)
Saturation Ratios: 11 % (ref 10.4–31.8)
TIBC: 229 ug/dL — ABNORMAL LOW (ref 250–450)
UIBC: 203 ug/dL

## 2021-06-19 LAB — CBC WITH DIFFERENTIAL/PLATELET
Abs Immature Granulocytes: 0.05 10*3/uL (ref 0.00–0.07)
Basophils Absolute: 0 10*3/uL (ref 0.0–0.1)
Basophils Relative: 0 %
Eosinophils Absolute: 0.2 10*3/uL (ref 0.0–0.5)
Eosinophils Relative: 2 %
HCT: 34.2 % — ABNORMAL LOW (ref 36.0–46.0)
Hemoglobin: 10.8 g/dL — ABNORMAL LOW (ref 12.0–15.0)
Immature Granulocytes: 0 %
Lymphocytes Relative: 21 %
Lymphs Abs: 2.8 10*3/uL (ref 0.7–4.0)
MCH: 29 pg (ref 26.0–34.0)
MCHC: 31.6 g/dL (ref 30.0–36.0)
MCV: 91.7 fL (ref 80.0–100.0)
Monocytes Absolute: 0.6 10*3/uL (ref 0.1–1.0)
Monocytes Relative: 5 %
Neutro Abs: 9.7 10*3/uL — ABNORMAL HIGH (ref 1.7–7.7)
Neutrophils Relative %: 72 %
Platelets: 254 10*3/uL (ref 150–400)
RBC: 3.73 MIL/uL — ABNORMAL LOW (ref 3.87–5.11)
RDW: 15.2 % (ref 11.5–15.5)
WBC: 13.4 10*3/uL — ABNORMAL HIGH (ref 4.0–10.5)
nRBC: 0 % (ref 0.0–0.2)

## 2021-06-19 LAB — ECHOCARDIOGRAM COMPLETE
AV Mean grad: 9.5 mmHg
AV Peak grad: 21.4 mmHg
Ao pk vel: 2.32 m/s
Area-P 1/2: 2.39 cm2
Height: 60 in
MV M vel: 5.76 m/s
MV Peak grad: 132.7 mmHg
S' Lateral: 1.3 cm
Weight: 2980.62 oz

## 2021-06-19 LAB — BRAIN NATRIURETIC PEPTIDE: B Natriuretic Peptide: 896 pg/mL — ABNORMAL HIGH (ref 0.0–100.0)

## 2021-06-19 LAB — COMPREHENSIVE METABOLIC PANEL
ALT: 12 U/L (ref 0–44)
AST: 19 U/L (ref 15–41)
Albumin: 3.1 g/dL — ABNORMAL LOW (ref 3.5–5.0)
Alkaline Phosphatase: 88 U/L (ref 38–126)
Anion gap: 8 (ref 5–15)
BUN: 17 mg/dL (ref 8–23)
CO2: 25 mmol/L (ref 22–32)
Calcium: 8.3 mg/dL — ABNORMAL LOW (ref 8.9–10.3)
Chloride: 105 mmol/L (ref 98–111)
Creatinine, Ser: 0.91 mg/dL (ref 0.44–1.00)
GFR, Estimated: >60 mL/min (ref >60)
Glucose, Bld: 206 mg/dL — ABNORMAL HIGH (ref 70–99)
Potassium: 3.3 mmol/L — ABNORMAL LOW (ref 3.5–5.1)
Sodium: 138 mmol/L (ref 135–145)
Total Bilirubin: 0.5 mg/dL (ref 0.3–1.2)
Total Protein: 6.6 g/dL (ref 6.5–8.1)

## 2021-06-19 LAB — GLUCOSE, CAPILLARY
Glucose-Capillary: 166 mg/dL — ABNORMAL HIGH (ref 70–99)
Glucose-Capillary: 216 mg/dL — ABNORMAL HIGH (ref 70–99)
Glucose-Capillary: 141 mg/dL — ABNORMAL HIGH (ref 70–99)

## 2021-06-19 LAB — BLOOD GAS, ARTERIAL
Acid-Base Excess: 0.2 mmol/L (ref 0.0–2.0)
Bicarbonate: 24.1 mmol/L (ref 20.0–28.0)
Delivery systems: POSITIVE
Drawn by: 29503
Expiratory PAP: 7
FIO2: 40
Inspiratory PAP: 16
O2 Saturation: 95.8 %
Patient temperature: 98.6
RATE: 19 resp/min
pCO2 arterial: 38.3 mmHg (ref 32.0–48.0)
pH, Arterial: 7.415 (ref 7.350–7.450)
pO2, Arterial: 83.4 mmHg (ref 83.0–108.0)

## 2021-06-19 LAB — RESP PANEL BY RT-PCR (FLU A&B, COVID) ARPGX2
Influenza A by PCR: NEGATIVE
Influenza B by PCR: NEGATIVE
SARS Coronavirus 2 by RT PCR: NEGATIVE

## 2021-06-19 LAB — MRSA NEXT GEN BY PCR, NASAL: MRSA by PCR Next Gen: NOT DETECTED

## 2021-06-19 LAB — HEMOGLOBIN A1C
Hgb A1c MFr Bld: 6.1 % — ABNORMAL HIGH (ref 4.8–5.6)
Mean Plasma Glucose: 128.37 mg/dL

## 2021-06-19 LAB — MAGNESIUM: Magnesium: 2 mg/dL (ref 1.7–2.4)

## 2021-06-19 LAB — PROCALCITONIN: Procalcitonin: <0.1 ng/mL

## 2021-06-19 LAB — TROPONIN I (HIGH SENSITIVITY): Troponin I (High Sensitivity): 16 ng/L (ref <18)

## 2021-06-19 MED ORDER — INSULIN ASPART 100 UNIT/ML IJ SOLN
0.0000 [IU] | Freq: Three times a day (TID) | INTRAMUSCULAR | Status: DC
  Administered 2021-06-19: 5 [IU] via SUBCUTANEOUS
  Administered 2021-06-19: 3 [IU] via SUBCUTANEOUS
  Administered 2021-06-20 – 2021-06-24 (×5): 2 [IU] via SUBCUTANEOUS
  Administered 2021-06-24: 3 [IU] via SUBCUTANEOUS
  Administered 2021-06-24: 2 [IU] via SUBCUTANEOUS
  Administered 2021-06-25: 3 [IU] via SUBCUTANEOUS
  Administered 2021-06-25: 2 [IU] via SUBCUTANEOUS
  Administered 2021-06-26: 3 [IU] via SUBCUTANEOUS

## 2021-06-19 MED ORDER — POTASSIUM CHLORIDE 10 MEQ/100ML IV SOLN
10.0000 meq | INTRAVENOUS | Status: AC
  Administered 2021-06-19 (×4): 10 meq via INTRAVENOUS
  Filled 2021-06-19 (×4): qty 100

## 2021-06-19 MED ORDER — CHLORHEXIDINE GLUCONATE 0.12 % MT SOLN
15.0000 mL | Freq: Two times a day (BID) | OROMUCOSAL | Status: DC
  Administered 2021-06-19 – 2021-06-26 (×15): 15 mL via OROMUCOSAL
  Filled 2021-06-19 (×15): qty 15

## 2021-06-19 MED ORDER — METOPROLOL TARTRATE 25 MG PO TABS
50.0000 mg | ORAL_TABLET | Freq: Two times a day (BID) | ORAL | Status: DC
  Administered 2021-06-19: 50 mg via ORAL
  Filled 2021-06-19: qty 2

## 2021-06-19 MED ORDER — ALBUTEROL SULFATE HFA 108 (90 BASE) MCG/ACT IN AERS
2.0000 | INHALATION_SPRAY | Freq: Two times a day (BID) | RESPIRATORY_TRACT | Status: DC
  Administered 2021-06-19 – 2021-06-26 (×14): 2 via RESPIRATORY_TRACT
  Filled 2021-06-19 (×2): qty 6.7

## 2021-06-19 MED ORDER — INSULIN ASPART 100 UNIT/ML IJ SOLN: 0.0000 [IU] | Freq: Every day | INTRAMUSCULAR | Status: DC

## 2021-06-19 MED ORDER — ACETAMINOPHEN 325 MG PO TABS
650.0000 mg | ORAL_TABLET | Freq: Four times a day (QID) | ORAL | Status: DC | PRN
  Administered 2021-06-19 – 2021-06-22 (×2): 650 mg via ORAL
  Filled 2021-06-19 (×2): qty 2

## 2021-06-19 MED ORDER — LORATADINE 10 MG PO TABS
10.0000 mg | ORAL_TABLET | Freq: Every day | ORAL | Status: DC
  Administered 2021-06-20 – 2021-06-26 (×7): 10 mg via ORAL
  Filled 2021-06-19 (×7): qty 1

## 2021-06-19 MED ORDER — DULOXETINE HCL 60 MG PO CPEP
60.0000 mg | ORAL_CAPSULE | Freq: Every day | ORAL | Status: DC
  Administered 2021-06-20 – 2021-06-26 (×7): 60 mg via ORAL
  Filled 2021-06-19: qty 1
  Filled 2021-06-19: qty 2
  Filled 2021-06-19 (×3): qty 1
  Filled 2021-06-19 (×2): qty 2

## 2021-06-19 MED ORDER — MECLIZINE HCL 25 MG PO TABS
25.0000 mg | ORAL_TABLET | Freq: Three times a day (TID) | ORAL | Status: DC | PRN
  Filled 2021-06-19: qty 1

## 2021-06-19 MED ORDER — ATORVASTATIN CALCIUM 40 MG PO TABS
80.0000 mg | ORAL_TABLET | Freq: Every day | ORAL | Status: DC
Start: 2021-06-19 — End: 2021-06-26
  Administered 2021-06-19 – 2021-06-26 (×8): 80 mg via ORAL
  Filled 2021-06-19 (×8): qty 2

## 2021-06-19 MED ORDER — DILTIAZEM HCL ER COATED BEADS 180 MG PO CP24: 180.0000 mg | ORAL_CAPSULE | Freq: Every day | ORAL | Status: DC

## 2021-06-19 MED ORDER — ACETAMINOPHEN 650 MG RE SUPP: 650.0000 mg | Freq: Four times a day (QID) | RECTAL | Status: DC | PRN

## 2021-06-19 MED ORDER — ENOXAPARIN SODIUM 40 MG/0.4ML IJ SOSY
40.0000 mg | PREFILLED_SYRINGE | INTRAMUSCULAR | Status: DC
  Administered 2021-06-19 – 2021-06-26 (×8): 40 mg via SUBCUTANEOUS
  Filled 2021-06-19 (×8): qty 0.4

## 2021-06-19 MED ORDER — ONDANSETRON HCL 4 MG PO TABS: 4.0000 mg | ORAL_TABLET | Freq: Four times a day (QID) | ORAL | Status: DC | PRN

## 2021-06-19 MED ORDER — POTASSIUM CHLORIDE CRYS ER 20 MEQ PO TBCR
40.0000 meq | EXTENDED_RELEASE_TABLET | Freq: Two times a day (BID) | ORAL | Status: AC
Start: 2021-06-19 — End: 2021-06-20
  Administered 2021-06-19 – 2021-06-20 (×3): 40 meq via ORAL
  Filled 2021-06-19 (×3): qty 2

## 2021-06-19 MED ORDER — ORAL CARE MOUTH RINSE
15.0000 mL | Freq: Two times a day (BID) | OROMUCOSAL | Status: DC
  Administered 2021-06-20 – 2021-06-26 (×11): 15 mL via OROMUCOSAL

## 2021-06-19 MED ORDER — FUROSEMIDE 10 MG/ML IJ SOLN
80.0000 mg | Freq: Once | INTRAMUSCULAR | Status: AC
  Administered 2021-06-19: 80 mg via INTRAVENOUS
  Filled 2021-06-19: qty 8

## 2021-06-19 MED ORDER — ONDANSETRON HCL 4 MG/2ML IJ SOLN
4.0000 mg | Freq: Four times a day (QID) | INTRAMUSCULAR | Status: DC | PRN
  Administered 2021-06-25: 4 mg via INTRAVENOUS
  Filled 2021-06-19: qty 2

## 2021-06-19 MED ORDER — ASPIRIN EC 81 MG PO TBEC
81.0000 mg | DELAYED_RELEASE_TABLET | Freq: Every day | ORAL | Status: DC
  Administered 2021-06-20 – 2021-06-26 (×7): 81 mg via ORAL
  Filled 2021-06-19 (×7): qty 1

## 2021-06-19 MED ORDER — CHLORHEXIDINE GLUCONATE CLOTH 2 % EX PADS
6.0000 | MEDICATED_PAD | Freq: Every day | CUTANEOUS | Status: DC
  Administered 2021-06-19 – 2021-06-22 (×4): 6 via TOPICAL

## 2021-06-19 MED ORDER — PANTOPRAZOLE SODIUM 40 MG PO TBEC
40.0000 mg | DELAYED_RELEASE_TABLET | Freq: Every day | ORAL | Status: DC
  Administered 2021-06-19 – 2021-06-26 (×8): 40 mg via ORAL
  Filled 2021-06-19 (×8): qty 1

## 2021-06-19 MED ORDER — ALLOPURINOL 100 MG PO TABS
100.0000 mg | ORAL_TABLET | Freq: Every day | ORAL | Status: DC
  Administered 2021-06-19 – 2021-06-26 (×8): 100 mg via ORAL
  Filled 2021-06-19 (×8): qty 1

## 2021-06-19 MED ORDER — ALBUTEROL SULFATE (2.5 MG/3ML) 0.083% IN NEBU: 3.0000 mL | INHALATION_SOLUTION | Freq: Four times a day (QID) | RESPIRATORY_TRACT | Status: DC | PRN

## 2021-06-19 MED ORDER — ALBUTEROL SULFATE HFA 108 (90 BASE) MCG/ACT IN AERS: 2.0000 | INHALATION_SPRAY | Freq: Two times a day (BID) | RESPIRATORY_TRACT | Status: DC

## 2021-06-19 MED ORDER — FUROSEMIDE 10 MG/ML IJ SOLN
60.0000 mg | Freq: Two times a day (BID) | INTRAMUSCULAR | Status: DC
  Administered 2021-06-19 – 2021-06-24 (×11): 60 mg via INTRAVENOUS
  Filled 2021-06-19 (×11): qty 6

## 2021-06-19 MED ORDER — ADULT MULTIVITAMIN W/MINERALS CH
1.0000 | ORAL_TABLET | Freq: Every day | ORAL | Status: DC
  Administered 2021-06-20 – 2021-06-26 (×7): 1 via ORAL
  Filled 2021-06-19 (×7): qty 1

## 2021-06-19 NOTE — Progress Notes (Signed)
Echocardiogram 2D Echocardiogram has been performed.  Alyssa Ingram 06/19/2021, 3:07 PM

## 2021-06-19 NOTE — ED Triage Notes (Signed)
BIBA Per EMS: Coming from home w/ c/o [facility] since this morning. Wheezing and rales noted in all lobes.  Hx CHF & asthma  Initial o2 80% RA NRB 15L - 95% Duoneb en route  Albuterol en route  125 solumedrol  20G L AC  2 nitro en route   156/86 95%  214 CBG  74 HR

## 2021-06-19 NOTE — ED Notes (Signed)
Son reports pt has fallen 2x during this month and she may have had some unsupervised falls. Pt reports that she cannot remember whether or not she has fallen.

## 2021-06-19 NOTE — ED Provider Notes (Signed)
[facility] DEPT Provider Note   CSN: [id number] Arrival date & time: 06/19/21  9021     History  Chief Complaint  Patient presents with   Shortness of Breath    Alyssa Ingram is a 76 y.o. female.  Patient is a 76 year old female with a history of CHF, GERD, diabetes, hokum, hypertension, hyperlipidemia who presents with shortness of breath.  She says that she has been having some worsening shortness of breath for the last 3 weeks but got worse over the last couple of days.  She has some associated chest tightness over the last 2 to 3 days.  She denies any significant cough or chest congestion.  She does have some increase in her leg swelling.  She has been using her inhaler at home without improvement in symptoms.  She is not on baseline oxygen at home.  Per EMS report, she was given albuterol/DuoNeb and Solu-Medrol 125 mg in route.  She was also given nitroglycerin in route.  She was initially 80% oxygen saturation on room air per EMS and was placed on nonrebreather.      Home Medications Prior to Admission medications   Medication Sig Start Date End Date Taking? Authorizing Provider  albuterol (ACCUNEB) 0.63 MG/3ML nebulizer solution INHALE 1 VIAL BY MOUTH VIA NEBULIZATION EVERY 6 HOURS AS NEEDED FOR WHEEZING. 04/03/18   [name], MD  albuterol (PROAIR HFA) 108 (90 Base) MCG/ACT inhaler Inhale 2 puffs into the lungs 2 (two) times daily. Patient taking differently: Inhale 2 puffs into the lungs 2 (two) times daily. May repeat if needed 06/13/16   [name], MD  allopurinol (ZYLOPRIM) 100 MG tablet Take 1 tablet (100 mg total) by mouth daily. 10/18/20   [name], MD  ammonium lactate (AMLACTIN) 12 % cream APPLY TOPICALLY AS NEEDED FOR DRY SKIN. 05/16/18   Trula Slade, DPM  aspirin 81 MG tablet Take 162 mg by mouth daily.     [provider]  atorvastatin (LIPITOR) 80 MG tablet Take 1 tablet (80 mg total) by mouth daily. 09/28/20    [name], MD  Blood Glucose Monitoring Suppl (ONE TOUCH ULTRA 2) w/Device KIT Use as directed three times daily E11.9 06/11/20   [name], MD  cetirizine (ZYRTEC) 10 MG tablet TAKE 1 TABLET BY MOUTH EVERY DAY AS NEEDED 11/17/20   [name], MD  Continuous Blood Gluc Receiver (FREESTYLE LIBRE READER) DEVI Apply 1 Device topically 4 (four) times daily as needed. E11.9 06/07/20   [name], MD  Continuous Blood Gluc Sensor (FREESTYLE LIBRE 14 DAY SENSOR) MISC Apply 1 Device topically every 14 (fourteen) days. E11.9 06/07/20   [name], MD  diltiazem (CARDIZEM CD) 120 MG 24 hr capsule Take 1 capsule (120 mg total) by mouth daily. 07/26/20 08/25/20  Dessa Phi, DO  diltiazem (CARDIZEM CD) 180 MG 24 hr capsule TAKE 1 CAPSULE BY MOUTH EVERY DAY 05/10/21   [name], MD  DULoxetine (CYMBALTA) 60 MG capsule Take 1 capsule (60 mg total) by mouth daily. 09/28/20 09/28/21  [name], MD  furosemide (LASIX) 20 MG tablet TAKE 3 TABLETS (60 MG TOTAL) BY MOUTH 2 TIMES DAILY. 03/21/21   [name], MD  gabapentin (NEURONTIN) 100 MG capsule Take 2 capsules (200 mg total) by mouth 2 (two) times daily. 07/26/20 08/25/20  Dessa Phi, DO  glipiZIDE (GLUCOTROL XL) 2.5 MG 24 hr tablet Take 1 tablet (2.5 mg total) by  mouth daily with breakfast. 01/18/21   [name], MD  glucose blood [facility] ULTRA) test strip Use as instructed three times daily E11.9 06/11/20   [name], MD  Lancets MISC Use as directed three times daily E11.9 06/11/20   [name], MD  lidocaine (LIDODERM) 5 % Place 1 patch onto the skin daily. Remove & Discard patch within 12 hours or as directed by MD 01/30/18   [name], MD  meclizine (ANTIVERT) 25 MG tablet TAKE 1 TABLET BY MOUTH THREE TIMES A DAY as needed 01/18/21   [name], MD  metoprolol tartrate (LOPRESSOR) 50 MG tablet Take 1 tablet (50 mg total) by mouth 2 (two) times daily. 10/21/20   [name], MD  pantoprazole (PROTONIX) 40 MG tablet TAKE 1  TABLET BY MOUTH EVERY DAY 10/22/20   [name], MD  triamcinolone (NASACORT AQ) 55 MCG/ACT AERO nasal inhaler Place 2 sprays into the nose daily. 12/31/18   [name], MD      Allergies    Sulfa antibiotics, Metformin and related, Other, and Prednisone    Review of Systems   Review of Systems  Constitutional:  Positive for fatigue. Negative for chills, diaphoresis and fever.  HENT:  Negative for congestion, rhinorrhea and sneezing.   Eyes: Negative.   Respiratory:  Positive for chest tightness, shortness of breath and wheezing. Negative for cough.   Cardiovascular:  Positive for leg swelling. Negative for chest pain.  Gastrointestinal:  Negative for abdominal pain, blood in stool, diarrhea, nausea and vomiting.  Genitourinary:  Negative for difficulty urinating, flank pain, frequency and hematuria.  Musculoskeletal:  Negative for arthralgias and back pain.  Skin:  Negative for rash.  Neurological:  Negative for dizziness, speech difficulty, weakness, numbness and headaches.   Physical Exam Updated Vital Signs BP 111/75 (BP Location: Left Arm)    Pulse 72    Temp 98.8 F (37.1 C) (Oral)    Resp (!) 27    SpO2 100%  Physical Exam Constitutional:      Appearance: She is well-developed.  HENT:     Head: Normocephalic and atraumatic.  Eyes:     Pupils: Pupils are equal, round, and reactive to light.  Cardiovascular:     Rate and Rhythm: Normal rate and regular rhythm.     Heart sounds: Murmur heard.  Pulmonary:     Effort: Pulmonary effort is normal. No respiratory distress.     Breath sounds: Decreased breath sounds, wheezing and rales present.  Chest:     Chest wall: No tenderness.  Abdominal:     General: Bowel sounds are normal.     Palpations: Abdomen is soft.     Tenderness: There is no abdominal tenderness. There is no guarding or rebound.  Musculoskeletal:        General: Normal range of motion.     Cervical back: Normal range of motion and neck supple.      Right lower leg: Edema present.     Left lower leg: Edema present.     Comments: Slight increase in edema in her left leg as compared to right but she says this is chronic for her  Lymphadenopathy:     Cervical: No cervical adenopathy.  Skin:    General: Skin is warm and dry.     Findings: No rash.  Neurological:     Mental Status: She is alert and oriented to person, place, and time.    ED Results /  Procedures / Treatments   Labs (all labs ordered are listed, but only abnormal results are displayed) Labs Reviewed  BRAIN NATRIURETIC PEPTIDE - Abnormal; Notable for the following components:      Result Value   B Natriuretic Peptide 896.0 (*)    All other components within normal limits  CBC WITH DIFFERENTIAL/PLATELET - Abnormal; Notable for the following components:   WBC 13.4 (*)    RBC 3.73 (*)    Hemoglobin 10.8 (*)    HCT 34.2 (*)    Neutro Abs 9.7 (*)    All other components within normal limits  COMPREHENSIVE METABOLIC PANEL - Abnormal; Notable for the following components:   Potassium 3.3 (*)    Glucose, Bld 206 (*)    Calcium 8.3 (*)    Albumin 3.1 (*)    All other components within normal limits  RESP PANEL BY RT-PCR (FLU A&B, COVID) [id number]  I-STAT ARTERIAL BLOOD GAS, ED  TROPONIN I (HIGH SENSITIVITY)    EKG EKG Interpretation  Date/Time:  Sunday June 19 2021 07:46:46 EST Ventricular Rate:  74 PR Interval:  143 QRS Duration: 93 QT Interval:  424 QTC Calculation: 468 R Axis:   80 Text Interpretation: Sinus rhythm Left atrial enlargement Probable left ventricular hypertrophy since last tracing no significant change Confirmed by Malvin Johns [phone]) on 06/19/2021 8:55:25 AM  Radiology DG Chest Portable 1 View  Result Date: 06/19/2021 CLINICAL DATA:  76 year old female with shortness of breath since 0400 hours. Wheezing and abnormal pulmonary auscultation. EXAM: PORTABLE CHEST 1 VIEW COMPARISON:  Portable chest 07/21/2020 and earlier. FINDINGS: Portable AP  semi upright view at 0823 hours. Rotated to the left. Similar lung volumes. Mild cardiomegaly suspected. Other mediastinal contours are within normal limits. Visualized tracheal air column is within normal limits. Streaky and indistinct opacity at both lung bases. But upper lung pulmonary vascularity seems to remain normal. No pneumothorax. No air bronchograms or definite effusion. Negative visible bowel gas. Chronic degenerative changes at the right shoulder. No acute osseous abnormality identified. IMPRESSION: Acute nonspecific bibasilar opacity. Top differential considerations include basilar predominant pulmonary edema, infection, less likely atelectasis. No definite effusion. Electronically Signed   By: Genevie Ann M.D.   On: 06/19/2021 08:44   DG Shoulder Right Portable  Result Date: 06/19/2021 CLINICAL DATA:  Recent falls.  Right superior shoulder pain. EXAM: RIGHT SHOULDER - 1 VIEW COMPARISON:  Chest radiograph-earlier same day FINDINGS: Examination is degraded due to patient body habitus. No definite fracture or dislocation. Severe degenerative change of the right glenohumeral joint with joint space loss, subchondral sclerosis and osteophytosis. Moderate degenerative change of the right AC joint with joint space loss, subchondral sclerosis and inferiorly directed osteophytosis. Erosions are noted about the rotator cuff insertion site. No evidence of calcific tendinitis. Limited visualization of the adjacent thorax demonstrates enlarged cardiac silhouette with indistinct pulmonary vasculature, potentially accentuated due to obliquity and technique. IMPRESSION: 1. No acute findings. 2. 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 Clinical correlation is advised. 3. Moderate degenerative change of the right AC joint. 4. Suspected cardiomegaly and pulmonary edema,  incompletely evaluated. Electronically Signed   By: Sandi Mariscal M.D.   On: 06/19/2021 09:43    Procedures Procedures    Medications Ordered in ED Medications  furosemide (LASIX) injection 80 mg (80 mg Intravenous Given 06/19/21 0915)    ED Course/ Medical Decision Making/ A&P  Medical Decision Making Problems Addressed: Acute on chronic congestive heart failure, unspecified heart failure type [facility]): acute illness or injury that poses a threat to life or bodily functions Acute respiratory failure with hypoxia [facility]): acute illness or injury that poses a threat to life or bodily functions  Amount and/or Complexity of Data Reviewed Independent Historian: EMS    Details: Son External Data Reviewed: labs, ECG and notes. Labs: ordered. Decision-making details documented in ED Course. Radiology: ordered and independent interpretation performed. Decision-making details documented in ED Course. ECG/medicine tests: ordered and independent interpretation performed. Decision-making details documented in ED Course.  Risk Prescription drug management. Decision regarding hospitalization.  Critical Care Total time providing critical care: 30-74 minutes  Patient is a 76 year old female who presents in respiratory distress.  She is noted to be hypoxic with increased work of breathing.  She had diminished breath sounds with wheezing and crackles in the bases.  Chart reviewed.  I do not see any diagnosis of COPD/asthma but she does use inhalers at home.  She was given nebulizer treatments by EMS and Solu-Medrol.  She also got nitroglycerin.  She is feeling better but still has significant increased work of breathing.  Chest x-ray was done she was interpreted by myself and the radiologist and shows some evidence of pulmonary edema.  Her BNP is elevated at over 800.  She has mild anemia but similar to prior values.  Her EKG was interpreted by me and shows no ischemic changes.  Given  her increased work of breathing, she was placed on BiPAP.  She was also given a dose of IV Lasix.  She is feeling much better after about an hour on the BiPAP.  Her oxygen saturations remained stable and she less work of breathing.  I spoke with Dr. Marylyn Ishihara with the hospitalist service who will admit the patient for further treatment.  She did have an x-ray of her right shoulder as she has been complaining of shoulder pain since having a couple falls over the last month or so.  She has some tenderness in her right shoulder but none in her elbow or wrist.  X-rays show no acute fracture or subluxation.  Final Clinical Impression(s) / ED Diagnoses Final diagnoses:  Acute on chronic congestive heart failure, unspecified heart failure type ([place])  Acute respiratory failure with hypoxia [facility])    Rx / DC Orders ED Discharge Orders     None         Malvin Johns, MD 06/19/21 [phone]

## 2021-06-19 NOTE — H&P (Signed)
History and Physical    Patient: Alyssa Ingram LXB:[id number] DOB: 02/21/1946 DOA: 06/19/2021 DOS: the patient was seen and examined on 06/19/2021 PCP: [name], MD  Patient coming from: Home  Chief Complaint:  Chief Complaint  Patient presents with   Shortness of Breath    HPI: Alyssa Ingram is a 76 y.o. female with medical history significant of HTN, HOCM, HFpEF, HLD, DM2, GERD. Presenting with dyspnea. Her symptoms started this morning with shortness of breath. It was significant enough to disrupt her normal morning activities. She initially thought that she was having indigestion and gas as she had this accompanying central chest pressure. She tried some ginger ale to help, but it didn't. She tried going to the bathroom to help, but it didn't. Her dyspnea persisted and became concerning, so family called for EMS.   Of note, the patient reports that she has missed several night time doses of her normal diuretic. She also notes having a recent increase in fried foods after fasting from them for weeks. Otherwise, she denies any aggravating or alleviating factors.   Review of Systems: As mentioned in the history of present illness. All other systems reviewed and are negative. Past Medical History:  Diagnosis Date   Abnormal MRI, spine 11/2011   ?discitis L5-S1, s/p CT bx 12/12/11   Allergic rhinitis, cause unspecified 01/31/2013   Arthritis    Asthma    Cervical cancer ([place])    Chronic diastolic heart failure (HCC)    Diabetic retinopathy (HCC)    GERD (gastroesophageal reflux disease)    H/O cardiovascular stress test    Nuclear study in 2011 normal perfusion   HOCM (hypertrophic obstructive cardiomyopathy) ([place])    Echo 12/19/11: Severe LVH, EF 55-65%, dynamic obstruction, wall motion normal, grade 1 diastolic dysfunction, systolic anterior motion of the mitral valve with mild MS and mild MR, mild LAE, PASP 34   Hyperlipidemia    Hypertension    Kidney stones    Obesity     Type II or unspecified type diabetes mellitus without mention of complication, uncontrolled 01/31/2013   Past Surgical History:  Procedure Laterality Date   ABDOMINAL HYSTERECTOMY     JOINT REPLACEMENT     right knee 2006   rotater cuff     right, 2005   Social History:  reports that she has never smoked. She has never used smokeless tobacco. She reports that she does not drink alcohol and does not use drugs.  Allergies  Allergen Reactions   Sulfa Antibiotics Hives   Metformin And Related    Other Hives    Unknown drug   Prednisone Swelling    Family History  Problem Relation Age of Onset   Heart disease Father    Arthritis Other    Heart disease Other    Hypertension Other    Diabetes Other    Alcohol abuse Other    Arthritis Other    Cancer Other        Lung Cancer   Heart disease Other    Hypertension Other    Sudden death Other    Kidney disease Other    Mental illness Other    Diabetes Other    Colon cancer Neg Hx     Prior to Admission medications   Medication Sig Start Date End Date Taking? Authorizing Provider  albuterol (ACCUNEB) 0.63 MG/3ML nebulizer solution INHALE 1 VIAL BY MOUTH VIA NEBULIZATION EVERY 6 HOURS AS NEEDED FOR WHEEZING. 04/03/18   Jenny Reichmann,  Hunt Oris, MD  albuterol (PROAIR HFA) 108 (90 Base) MCG/ACT inhaler Inhale 2 puffs into the lungs 2 (two) times daily. Patient taking differently: Inhale 2 puffs into the lungs 2 (two) times daily. May repeat if needed 06/13/16   [name], MD  allopurinol (ZYLOPRIM) 100 MG tablet Take 1 tablet (100 mg total) by mouth daily. 10/18/20   [name], MD  ammonium lactate (AMLACTIN) 12 % cream APPLY TOPICALLY AS NEEDED FOR DRY SKIN. 05/16/18   Trula Slade, DPM  aspirin 81 MG tablet Take 162 mg by mouth daily.     [provider]  atorvastatin (LIPITOR) 80 MG tablet Take 1 tablet (80 mg total) by mouth daily. 09/28/20   [name], MD  Blood Glucose Monitoring Suppl (ONE TOUCH ULTRA 2) w/Device  KIT Use as directed three times daily E11.9 06/11/20   [name], MD  cetirizine (ZYRTEC) 10 MG tablet TAKE 1 TABLET BY MOUTH EVERY DAY AS NEEDED 11/17/20   [name], MD  Continuous Blood Gluc Receiver (FREESTYLE LIBRE READER) DEVI Apply 1 Device topically 4 (four) times daily as needed. E11.9 06/07/20   [name], MD  Continuous Blood Gluc Sensor (FREESTYLE LIBRE 14 DAY SENSOR) MISC Apply 1 Device topically every 14 (fourteen) days. E11.9 06/07/20   [name], MD  diltiazem (CARDIZEM CD) 120 MG 24 hr capsule Take 1 capsule (120 mg total) by mouth daily. 07/26/20 08/25/20  Dessa Phi, DO  diltiazem (CARDIZEM CD) 180 MG 24 hr capsule TAKE 1 CAPSULE BY MOUTH EVERY DAY 05/10/21   [name], MD  DULoxetine (CYMBALTA) 60 MG capsule Take 1 capsule (60 mg total) by mouth daily. 09/28/20 09/28/21  [name], MD  furosemide (LASIX) 20 MG tablet TAKE 3 TABLETS (60 MG TOTAL) BY MOUTH 2 TIMES DAILY. 03/21/21   [name], MD  gabapentin (NEURONTIN) 100 MG capsule Take 2 capsules (200 mg total) by mouth 2 (two) times daily. 07/26/20 08/25/20  Dessa Phi, DO  glipiZIDE (GLUCOTROL XL) 2.5 MG 24 hr tablet Take 1 tablet (2.5 mg total) by mouth daily with breakfast. 01/18/21   [name], MD  glucose blood [facility] ULTRA) test strip Use as instructed three times daily E11.9 06/11/20   [name], MD  Lancets MISC Use as directed three times daily E11.9 06/11/20   [name], MD  lidocaine (LIDODERM) 5 % Place 1 patch onto the skin daily. Remove & Discard patch within 12 hours or as directed by MD 01/30/18   [name], MD  meclizine (ANTIVERT) 25 MG tablet TAKE 1 TABLET BY MOUTH THREE TIMES A DAY as needed 01/18/21   [name], MD  metoprolol tartrate (LOPRESSOR) 50 MG tablet Take 1 tablet (50 mg total) by mouth 2 (two) times daily. 10/21/20   [name], MD  pantoprazole (PROTONIX) 40 MG tablet TAKE 1 TABLET BY MOUTH EVERY DAY 10/22/20   [name], MD  triamcinolone (NASACORT AQ)  55 MCG/ACT AERO nasal inhaler Place 2 sprays into the nose daily. 12/31/18   [name], MD    Physical Exam: Vitals:   06/19/21 0830 06/19/21 0900 06/19/21 0934 06/19/21 1000  BP: 114/70 129/73 111/75 108/68  Pulse: 74 73 72 73  Resp: (!) 26 (!) 26 (!) 27 (!) 21  Temp:      TempSrc:      SpO2: 97% 100% 100% 100%   General: 76 y.o. female  resting in bed in NAD Eyes: PERRL, normal sclera ENMT: Nares patent w/o discharge, orophaynx clear, dentition normal, ears w/o discharge/lesions/ulcers Neck: Supple, trachea midline Cardiovascular: RRR, +S1, S2, no g/r, 3/6 SEM, equal pulses throughout Respiratory: decreased at bases, scattered crackles, no w/r, increased WOB on BiPap GI: BS+, NDNT, no masses noted, no organomegaly noted MSK: No c/c; BLE edema w/ chronic venous changes Neuro: A&O x 3, no focal deficits Psyc: Appropriate interaction and affect, calm/cooperative   Data Reviewed:  K+ 3.3 BNP 896 Trp 16  EKG sinus, no st elevations CXR: Acute nonspecific bibasilar opacity. Top differential considerations include basilar predominant pulmonary edema, infection, less likely atelectasis.  Assessment and Plan: No notes have been filed under this hospital service. Service: Hospitalist  Acute hypoxic respiratory failure Acute on chronic diastolic HF HOCM     - admit to inpt, SDU     - continue lasix; 44m IV BID     - continue home meds as BP tolerates     - follow up ABG     - wean BiPap as able     - check Echo     - check procal     - follow I&O and daily wts     - continue inhalers PRN  HTN     - resume home meds as BP tolerates  DM2     - A1c, glucose checks, SSI, DM diet when off BiPap  HLD     - resume home regimen  GERD     - PPI  Hypokalemia     - check Mg2+; replace K+  Normocytic anemia     - no evidence of bleed; check iron studies  Advance Care Planning: FULL  Consults: None  Family Communication: w/ son at bedside  Severity of  Illness: The appropriate patient status for this patient is INPATIENT. Inpatient status is judged to be reasonable and necessary in order to provide the required intensity of service to ensure the patient's safety. The patient's presenting symptoms, physical exam findings, and initial radiographic and laboratory data in the context of their chronic comorbidities is felt to place them at high risk for further clinical deterioration. Furthermore, it is not anticipated that the patient will be medically stable for discharge from the hospital within 2 midnights of admission.   * I certify that at the point of admission it is my clinical judgment that the patient will require inpatient hospital care spanning beyond 2 midnights from the point of admission due to high intensity of service, high risk for further deterioration and high frequency of surveillance required.*  Author: TJonnie Finner DO 06/19/2021 10:07 AM  For on call review [email or web address]

## 2021-06-19 NOTE — ED Notes (Signed)
Patient given lasix and placed on purewick.

## 2021-06-19 NOTE — ED Notes (Signed)
RT called for BiPAP placement.

## 2021-06-19 NOTE — ED Notes (Signed)
RT called to inform we need ABG.

## 2021-06-19 NOTE — ED Notes (Signed)
Patient son at bedside

## 2021-06-20 DIAGNOSIS — I872 Venous insufficiency (chronic) (peripheral): Secondary | ICD-10-CM

## 2021-06-20 DIAGNOSIS — K219 Gastro-esophageal reflux disease without esophagitis: Secondary | ICD-10-CM

## 2021-06-20 DIAGNOSIS — E1165 Type 2 diabetes mellitus with hyperglycemia: Secondary | ICD-10-CM

## 2021-06-20 DIAGNOSIS — E7800 Pure hypercholesterolemia, unspecified: Secondary | ICD-10-CM

## 2021-06-20 DIAGNOSIS — E559 Vitamin D deficiency, unspecified: Secondary | ICD-10-CM

## 2021-06-20 DIAGNOSIS — I421 Obstructive hypertrophic cardiomyopathy: Secondary | ICD-10-CM

## 2021-06-20 DIAGNOSIS — G629 Polyneuropathy, unspecified: Secondary | ICD-10-CM

## 2021-06-20 LAB — GLUCOSE, CAPILLARY
Glucose-Capillary: 134 mg/dL — ABNORMAL HIGH (ref 70–99)
Glucose-Capillary: 115 mg/dL — ABNORMAL HIGH (ref 70–99)
Glucose-Capillary: 123 mg/dL — ABNORMAL HIGH (ref 70–99)
Glucose-Capillary: 111 mg/dL — ABNORMAL HIGH (ref 70–99)

## 2021-06-20 LAB — CBC
HCT: 31.5 % — ABNORMAL LOW (ref 36.0–46.0)
Hemoglobin: 10.3 g/dL — ABNORMAL LOW (ref 12.0–15.0)
MCH: 28.8 pg (ref 26.0–34.0)
MCHC: 32.7 g/dL (ref 30.0–36.0)
MCV: 88 fL (ref 80.0–100.0)
Platelets: 249 10*3/uL (ref 150–400)
RBC: 3.58 MIL/uL — ABNORMAL LOW (ref 3.87–5.11)
RDW: 14.7 % (ref 11.5–15.5)
WBC: 11.6 10*3/uL — ABNORMAL HIGH (ref 4.0–10.5)
nRBC: 0 % (ref 0.0–0.2)

## 2021-06-20 LAB — COMPREHENSIVE METABOLIC PANEL
ALT: 12 U/L (ref 0–44)
AST: 14 U/L — ABNORMAL LOW (ref 15–41)
Albumin: 3.1 g/dL — ABNORMAL LOW (ref 3.5–5.0)
Alkaline Phosphatase: 80 U/L (ref 38–126)
Anion gap: 8 (ref 5–15)
BUN: 18 mg/dL (ref 8–23)
CO2: 26 mmol/L (ref 22–32)
Calcium: 8.5 mg/dL — ABNORMAL LOW (ref 8.9–10.3)
Chloride: 106 mmol/L (ref 98–111)
Creatinine, Ser: 0.85 mg/dL (ref 0.44–1.00)
GFR, Estimated: >60 mL/min (ref >60)
Glucose, Bld: 149 mg/dL — ABNORMAL HIGH (ref 70–99)
Potassium: 4.1 mmol/L (ref 3.5–5.1)
Sodium: 140 mmol/L (ref 135–145)
Total Bilirubin: 0.6 mg/dL (ref 0.3–1.2)
Total Protein: 6.8 g/dL (ref 6.5–8.1)

## 2021-06-20 MED ORDER — VITAMIN D 25 MCG (1000 UNIT) PO TABS
1000.0000 [IU] | ORAL_TABLET | Freq: Every day | ORAL | Status: DC
  Administered 2021-06-20 – 2021-06-26 (×7): 1000 [IU] via ORAL
  Filled 2021-06-20 (×7): qty 1

## 2021-06-20 MED ORDER — METOPROLOL TARTRATE 25 MG PO TABS
75.0000 mg | ORAL_TABLET | Freq: Two times a day (BID) | ORAL | Status: DC
  Administered 2021-06-20 – 2021-06-21 (×3): 75 mg via ORAL
  Filled 2021-06-20 (×5): qty 3

## 2021-06-20 MED ORDER — GABAPENTIN 100 MG PO CAPS
200.0000 mg | ORAL_CAPSULE | Freq: Two times a day (BID) | ORAL | Status: DC
  Administered 2021-06-20 – 2021-06-26 (×13): 200 mg via ORAL
  Filled 2021-06-20 (×13): qty 2

## 2021-06-20 NOTE — Assessment & Plan Note (Addendum)
-   Secondary to acute on chronic diastolic CHF exacerbation. -Patient on admission required BiPAP, now on minimal O2 support -Clinically improved -Resolved with lasix -See above problem #1 of CHF exacerbation.

## 2021-06-20 NOTE — Assessment & Plan Note (Signed)
-   Outpatient follow-up with orthopedics.

## 2021-06-20 NOTE — Assessment & Plan Note (Addendum)
-  Continue on coreg, cardizem, lasix per above -BP stable at this time

## 2021-06-20 NOTE — Assessment & Plan Note (Signed)
PPI ?

## 2021-06-20 NOTE — Progress Notes (Signed)
PROGRESS NOTE    Alyssa Ingram  ENI:[id number] DOB: 15-Nov-1945 DOA: 06/19/2021 PCP: [name], MD    Chief Complaint  Patient presents with   Shortness of Breath    Brief Narrative:  Patient 76 year old female history of hypertension, hokum, HFpEF, hyperlipidemia, diabetes type 2, GERD presented with worsening shortness of breath which started on the morning of admission.  Patient initially felt was having indigestion and gas tried some ginger ale with no improvement dyspnea persisted and got worse so family called EMS.  Patient noted to have missed several nighttime doses of her normal diuretics with recent increase in dietary indiscretion.  Patient noted to have elevated BNP, initially required BiPAP on admission and noted to be in acute CHF exacerbation.  Patient placed on IV Lasix.  Noted to have soft/borderline blood pressure admitted to the stepdown unit.    Assessment & Plan:   Assessment and Plan: * Acute on chronic heart failure ([place]) - Patient presented worsening shortness of breath and noted to be in acute CHF. -Secondary to missed doses of diuretics over several days, dietary indiscretion. -Patient with history of HOCUM. -BNP noted to be elevated at 896. -Chest x-ray concerning for basilar pulmonary edema. -Patient noted to have required BiPAP on admission. -Currently off BiPAP with some clinical improvement. -Patient with sats of 99% on 3 L nasal cannula. -Urine output of 2.1 L over the past 24 hours. -Continue current dose of Lasix 60 mg every 12 hours, Cardizem, Lopressor, Lipitor.  -Strict I's and O's, daily weights. - Due to complex cardiac history we will consult with cardiology for further evaluation and management.  Acute respiratory failure with hypoxia (HCC) - Secondary to acute on chronic diastolic CHF exacerbation. -Patient on admission required BiPAP currently on 3 L nasal cannula. -Clinical improvement. -Continue IV Lasix. -See above problem #1 of  CHF exacerbation. -Discontinue BiPAP from room.  Hypertrophic obstructive cardiomyopathy ([place])- (present on admission) - Continue diuresis for acute CHF exacerbation. -Continue Lopressor and Cardizem. -Cardiology consulted.  Hypertension- (present on admission) - Blood pressure noted to be borderline. -Patient on IV Lasix for diuresis. -Continue Cardizem, Lopressor monitor blood pressure with diuretics. -Follow.  Neuropathy - Resume home regimen Neurontin.  Diabetes ([place]) - Hemoglobin A1c 6.1 (06/19/2021) -CBG 134 this morning. -Continue to hold oral hypoglycemic agents. -SSI.  HLD (hyperlipidemia)- (present on admission) - Continue statin.  GERD (gastroesophageal reflux disease)- (present on admission) - PPI.  Vitamin D deficiency- (present on admission) - Resume home regiment cholecalciferol.   -Outpatient follow-up.  Chronic venous insufficiency- (present on admission) - Outpatient follow-up.  Degenerative arthritis of left knee- (present on admission) - Outpatient follow-up with orthopedics.         DVT prophylaxis: Lovenox Code Status: Full Family Communication: Updated patient.  No family at bedside. Disposition: TBD  Status is: Inpatient Remains inpatient appropriate because: Severity of illness           Consultants:  Cardiology pending  Procedures:  Chest x-ray 06/19/2021 Plain films of the right shoulder 06/19/2021  Antimicrobials:  -None   Subjective: Sitting up in bed eating breakfast.  States she feels shortness of breath is improving since admission.  Patient states did not require BiPAP overnight.  Patient states may have missed a few days of her diuretics.  Patient states dietary discretion was only when they had guests.  Patient states all that was pretty good.  Objective: Vitals:   06/20/21 0500 06/20/21 0600 06/20/21 0700 06/20/21 0808  BP: 138/73 131/71 Marland Kitchen)  118/59   Pulse: 78 83 70   Resp: (!) 21 18 18    Temp:    98.4 F  (36.9 C)  TempSrc:    Oral  SpO2: 99% 100% 100%   Weight: 83.7 kg     Height:        Intake/Output Summary (Last 24 hours) at 06/20/2021 0918 Last data filed at 06/19/2021 2130 Gross per 24 hour  Intake 516.97 ml  Output 2100 ml  Net -1583.03 ml   Filed Weights   06/19/21 1213 06/20/21 0500  Weight: 84.5 kg 83.7 kg    Examination:  General exam: Appears calm and comfortable  Respiratory system: Diffuse crackles noted.  Normal respiratory effort.  No use of accessory muscles of respiration. Cardiovascular system: RRR with 3/6 SEM.  Positive JVD.  Bilateral lower extremity with chronic venous stasis noted and some hypertrophy.  Trace to 1+ bilateral lower extremity edema.  Gastrointestinal system: Abdomen is nondistended, soft and nontender. No organomegaly or masses felt. Normal bowel sounds heard. Central nervous system: Alert and oriented. No focal neurological deficits. Extremities: Symmetric 5 x 5 power. Skin: No rashes, lesions or ulcers Psychiatry: Judgement and insight appear fair. Mood & affect appropriate.     Data Reviewed:   CBC: Recent Labs  Lab 06/19/21 0759 06/20/21 0249  WBC 13.4* 11.6*  NEUTROABS 9.7*  --   HGB 10.8* 10.3*  HCT 34.2* 31.5*  MCV 91.7 88.0  PLT 254 101    Basic Metabolic Panel: Recent Labs  Lab 06/19/21 0759 06/19/21 1224 06/20/21 0249  NA 138  --  140  K 3.3*  --  4.1  CL 105  --  106  CO2 25  --  26  GLUCOSE 206*  --  149*  BUN 17  --  18  CREATININE 0.91  --  0.85  CALCIUM 8.3*  --  8.5*  MG  --  2.0  --     GFR: Estimated Creatinine Clearance: 54.9 mL/min (by C-G formula based on SCr of 0.85 mg/dL).  Liver Function Tests: Recent Labs  Lab 06/19/21 0759 06/20/21 0249  AST 19 14*  ALT 12 12  ALKPHOS 88 80  BILITOT 0.5 0.6  PROT 6.6 6.8  ALBUMIN 3.1* 3.1*    CBG: Recent Labs  Lab 06/19/21 1312 06/19/21 1641 06/19/21 2204 06/20/21 0808  GLUCAP 166* 216* 141* 134*     Recent Results (from the past 240  hour(s))  Resp Panel by RT-PCR (Flu A&B, Covid) Nasopharyngeal Swab     Status: None   Collection Time: 06/19/21  9:24 AM   Specimen: Nasopharyngeal Swab; Nasopharyngeal(NP) swabs in vial transport medium  Result Value Ref Range Status   SARS Coronavirus 2 by RT PCR NEGATIVE NEGATIVE Final    Comment: (NOTE) SARS-CoV-2 target nucleic acids are NOT DETECTED.  The SARS-CoV-2 RNA is generally detectable in upper respiratory specimens during the acute phase of infection. The lowest concentration of SARS-CoV-2 viral copies this assay can detect is 138 copies/mL. A negative result does not preclude SARS-Cov-2 infection and should not be used as the sole basis for treatment or other patient management decisions. A negative result may occur with  improper specimen collection/handling, submission of specimen other than nasopharyngeal swab, presence of viral mutation(s) within the areas targeted by this assay, and inadequate number of viral copies(<138 copies/mL). A negative result must be combined with clinical observations, patient history, and epidemiological information. The expected result is Negative.  Fact Sheet for Patients:  EntrepreneurPulse.com.au  Fact Sheet for Healthcare Providers:  IncredibleEmployment.be  This test is no t yet approved or cleared by the [place] FDA and  has been authorized for detection and/or diagnosis of SARS-CoV-2 by FDA under an Emergency Use Authorization (EUA). This EUA will remain  in effect (meaning this test can be used) for the duration of the COVID-19 declaration under Section 564(b)(1) of the Act, 21 U.S.C.section 360bbb-3(b)(1), unless the authorization is terminated  or revoked sooner.       Influenza A by PCR NEGATIVE NEGATIVE Final   Influenza B by PCR NEGATIVE NEGATIVE Final    Comment: (NOTE) The Xpert Xpress SARS-CoV-2/FLU/RSV plus assay is intended as an aid in the diagnosis of influenza from  Nasopharyngeal swab specimens and should not be used as a sole basis for treatment. Nasal washings and aspirates are unacceptable for Xpert Xpress SARS-CoV-2/FLU/RSV testing.  Fact Sheet for Patients: EntrepreneurPulse.com.au  Fact Sheet for Healthcare Providers: IncredibleEmployment.be  This test is not yet approved or cleared by the [place] FDA and has been authorized for detection and/or diagnosis of SARS-CoV-2 by FDA under an Emergency Use Authorization (EUA). This EUA will remain in effect (meaning this test can be used) for the duration of the COVID-19 declaration under Section 564(b)(1) of the Act, 21 U.S.C. section 360bbb-3(b)(1), unless the authorization is terminated or revoked.  Performed at [facility], [place] [address], [place], [place] [address]   MRSA Next Gen by PCR, Nasal     Status: None   Collection Time: 06/19/21 12:17 PM   Specimen: Nasal Mucosa; Nasal Swab  Result Value Ref Range Status   MRSA by PCR Next Gen NOT DETECTED NOT DETECTED Final    Comment: (NOTE) The GeneXpert MRSA Assay (FDA approved for NASAL specimens only), is one component of a comprehensive MRSA colonization surveillance program. It is not intended to diagnose MRSA infection nor to guide or monitor treatment for MRSA infections. Test performance is not FDA approved in patients less than 17 years old. Performed at [facility], [place] [address], [place], [place] [address]          Radiology Studies: DG Chest Portable 1 View  Result Date: 06/19/2021 CLINICAL DATA:  76 year old female with shortness of breath since 0400 hours. Wheezing and abnormal pulmonary auscultation. EXAM: PORTABLE CHEST 1 VIEW COMPARISON:  Portable chest 07/21/2020 and earlier. FINDINGS: Portable AP semi upright view at 0823 hours. Rotated to the left. Similar lung volumes. Mild cardiomegaly suspected. Other mediastinal contours are  within normal limits. Visualized tracheal air column is within normal limits. Streaky and indistinct opacity at both lung bases. But upper lung pulmonary vascularity seems to remain normal. No pneumothorax. No air bronchograms or definite effusion. Negative visible bowel gas. Chronic degenerative changes at the right shoulder. No acute osseous abnormality identified. IMPRESSION: Acute nonspecific bibasilar opacity. Top differential considerations include basilar predominant pulmonary edema, infection, less likely atelectasis. No definite effusion. Electronically Signed   By: Genevie Ann M.D.   On: 06/19/2021 08:44   DG Shoulder Right Portable  Result Date: 06/19/2021 CLINICAL DATA:  Recent falls.  Right superior shoulder pain. EXAM: RIGHT SHOULDER - 1 VIEW COMPARISON:  Chest radiograph-earlier same day FINDINGS: Examination is degraded due to patient body habitus. No definite fracture or dislocation. Severe degenerative change of the right glenohumeral joint with joint space loss, subchondral sclerosis and osteophytosis. Moderate degenerative change of the right AC joint with joint space loss, subchondral sclerosis and inferiorly directed osteophytosis. Erosions are noted  about the rotator cuff insertion site. No evidence of calcific tendinitis. Limited visualization of the adjacent thorax demonstrates enlarged cardiac silhouette with indistinct pulmonary vasculature, potentially accentuated due to obliquity and technique. IMPRESSION: 1. No acute findings. 2. 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 Clinical correlation is advised. 3. Moderate degenerative change of the right AC joint. 4. Suspected cardiomegaly and pulmonary edema, incompletely evaluated. Electronically Signed   By: Sandi Mariscal M.D.   On: 06/19/2021 09:43   ECHOCARDIOGRAM COMPLETE  Result Date:  06/19/2021    ECHOCARDIOGRAM REPORT   Patient Name:   Alyssa Ingram Date of Exam: 06/19/2021 Medical Rec #:  [id number]        Height:       60.0 in Accession #:    [id number]       Weight:       186.3 lb Date of Birth:  [birth year]        BSA:          1.811 m Patient Age:    36 years         BP:           166/90 mmHg Patient Gender: F                HR:           85 bpm. Exam Location:  Inpatient Procedure: 2D Echo Indications:    CHF  History:        Patient has prior history of Echocardiogram examinations, most                 recent 07/24/2020. CHF; Risk Factors:Diabetes and Hypertension.  Sonographer:    Jefferey Pica Referring Phys: [id number] [place]  1. IVS thickneess ~24 mm at the basal septum. LVOT gradients not fully assessed. HOCM. Left ventricular ejection fraction, by estimation, is 70 to 75%. The left ventricle has hyperdynamic function. The left ventricle has no regional wall motion abnormalities. There is severe asymmetric left ventricular hypertrophy of the basal-septal segment. Left ventricular diastolic parameters are indeterminate.  2. Right ventricular systolic function is normal. The right ventricular size is normal. There is severely elevated pulmonary artery systolic pressure.  3. Left atrial size was severely dilated.  4. Right atrial size was severely dilated.  5. MV mean gradient 9.0 mmhg HR 85 bpm. Calcified valve. MVA 2.3 cm2. The mitral valve is degenerative. Mild mitral valve regurgitation. Mild to moderate mitral stenosis.  6. Aortic valve regurgitation is not visualized. Aortic valve sclerosis/calcification is present, without any evidence of aortic stenosis.  7. The inferior vena cava is dilated in size with >50% respiratory variability, suggesting right atrial pressure of 8 mmHg. Comparison(s): No significant change from prior study. FINDINGS  Left Ventricle: IVS thickneess ~24 mm at the basal septum. LVOT gradients not fully assessed. HOCM. Left ventricular  ejection fraction, by estimation, is 70 to 75%. The left ventricle has hyperdynamic function. The left ventricle has no regional wall motion abnormalities. The left ventricular internal cavity size was small. There is severe asymmetric left ventricular hypertrophy of the basal-septal segment. Left ventricular diastolic parameters are indeterminate. Right Ventricle: The right ventricular size is normal. No increase in right ventricular wall thickness. Right ventricular systolic function is normal. There is severely elevated pulmonary artery systolic pressure. The tricuspid regurgitant velocity is 4.07 m/s, and with an assumed right atrial  pressure of 8 mmHg, the estimated right ventricular systolic pressure is 70.4 mmHg. Left Atrium: Left atrial size was severely dilated. Right Atrium: Right atrial size was severely dilated. Pericardium: There is no evidence of pericardial effusion. Mitral Valve: MV mean gradient 9.0 mmhg HR 85 bpm. Calcified valve. MVA 2.3 cm2. The mitral valve is degenerative in appearance. There is severe calcification of the mitral valve leaflet(s). Mild mitral valve regurgitation. Mild to moderate mitral valve stenosis. MV peak gradient, 17.5 mmHg. The mean mitral valve gradient is 9.0 mmHg. Tricuspid Valve: The tricuspid valve is grossly normal. Tricuspid valve regurgitation is mild. Aortic Valve: Aortic valve regurgitation is not visualized. Aortic valve sclerosis/calcification is present, without any evidence of aortic stenosis. Aortic valve mean gradient measures 9.5 mmHg. Aortic valve peak gradient measures 21.4 mmHg. Pulmonic Valve: The pulmonic valve was grossly normal. Pulmonic valve regurgitation is not visualized. Aorta: The aortic root and ascending aorta are structurally normal, with no evidence of dilitation. Venous: The inferior vena cava is dilated in size with greater than 50% respiratory variability, suggesting right atrial pressure of 8 mmHg. IAS/Shunts: No atrial level shunt  detected by color flow Doppler.  LEFT VENTRICLE PLAX 2D LVIDd:         4.30 cm Diastology LVIDs:         1.30 cm LV e' medial:    3.25 cm/s LV PW:         1.50 cm LV E/e' medial:  49.8 LV IVS:        2.30 cm LV e' lateral:   3.62 cm/s                        LV E/e' lateral: 44.8  RIGHT VENTRICLE             IVC RV Basal diam:  3.10 cm     IVC diam: 2.20 cm RV Mid diam:    2.70 cm RV S prime:     17.60 cm/s TAPSE (M-mode): 2.3 cm LEFT ATRIUM              Index        RIGHT ATRIUM           Index LA diam:        5.10 cm  2.82 cm/m   RA Area:     16.80 cm LA Vol (A2C):   131.0 ml 72.33 ml/m  RA Volume:   49.70 ml  27.44 ml/m LA Vol (A4C):   113.0 ml 62.39 ml/m LA Biplane Vol: 123.0 ml 67.91 ml/m  AORTIC VALVE                    PULMONIC VALVE AV Vmax:           231.50 cm/s  PV Vmax:       1.04 m/s AV Vmean:          138.500 cm/s PV Peak grad:  4.3 mmHg AV VTI:            0.392 m AV Peak Grad:      21.4 mmHg AV Mean Grad:      9.5 mmHg LVOT Vmax:         197.00 cm/s LVOT Vmean:        114.000 cm/s LVOT VTI:          0.350 m LVOT/AV VTI ratio: 0.89  AORTA Ao Root diam: 3.30 cm Ao Asc diam:  3.40 cm MITRAL  VALVE                TRICUSPID VALVE MV Area (PHT): 2.39 cm     TR Peak grad:   66.3 mmHg MV Peak grad:  17.5 mmHg    TR Vmax:        407.00 cm/s MV Mean grad:  9.0 mmHg MV Vmax:       2.09 m/s     SHUNTS MV Vmean:      144.0 cm/s   Systemic VTI: 0.35 m MV Decel Time: 318 msec MR Peak grad: 132.7 mmHg MR Vmax:      576.00 cm/s MV E velocity: 162.00 cm/s MV A velocity: 156.00 cm/s MV E/A ratio:  1.04 Landscape architect signed by Phineas Inches Signature Date/Time: 06/19/2021/3:36:55 PM    Final         Scheduled Meds:  albuterol  2 puff Inhalation BID   allopurinol  100 mg Oral Daily   aspirin EC  81 mg Oral Daily   atorvastatin  80 mg Oral Daily   chlorhexidine  15 mL Mouth Rinse BID   Chlorhexidine Gluconate Cloth  6 each Topical Daily   diltiazem  180 mg Oral Daily   DULoxetine  60 mg Oral  Daily   enoxaparin (LOVENOX) injection  40 mg Subcutaneous Q24H   furosemide  60 mg Intravenous BID   insulin aspart  0-15 Units Subcutaneous TID WC   insulin aspart  0-5 Units Subcutaneous QHS   loratadine  10 mg Oral Daily   mouth rinse  15 mL Mouth Rinse q12n4p   metoprolol tartrate  50 mg Oral BID   multivitamin with minerals  1 tablet Oral Daily   pantoprazole  40 mg Oral Daily   potassium chloride  40 mEq Oral BID   Continuous Infusions:   LOS: 1 day    Time spent: 40 minutes    Irine Seal, MD [facility]   To contact the attending provider between 7A-7P or the covering provider during after hours 7P-7A, please log into the web site [email or web address] and access using universal [place] password for that web site. If you do not have the password, please call the hospital operator.  06/20/2021, 9:18 AM

## 2021-06-20 NOTE — TOC Initial Note (Signed)
Transition of Care [facility]) - Initial/Assessment Note    Patient Details  Name: Alyssa Ingram MRN: [id number] Date of Birth: January 27, 1946  Transition of Care [facility]) CM/SW Contact:    Dessa Phi, RN Phone Number: 06/20/2021, 9:53 AM  Clinical Narrative: Monitor for d/c plans.                  Expected Discharge Plan: Home/Self Care Barriers to Discharge: Continued Medical Work up   Patient Goals and CMS Choice Patient states their goals for this hospitalization and ongoing recovery are:: Home CMS Medicare.gov Compare Post Acute Care list provided to:: Patient Choice offered to / list presented to : Patient  Expected Discharge Plan and Services Expected Discharge Plan: Home/Self Care   Discharge Planning Services: CM Consult   Living arrangements for the past 2 months: Single Family Home                                      Prior Living Arrangements/Services Living arrangements for the past 2 months: Single Family Home Lives with:: Adult Children Patient language and need for interpreter reviewed:: Yes Do you feel safe going back to the place where you live?: Yes      Need for Family Participation in Patient Care: Yes (Comment) Care giver support system in place?: Yes (comment)   Criminal Activity/Legal Involvement Pertinent to Current Situation/Hospitalization: No - Comment as needed  Activities of Daily Living      Permission Sought/Granted Permission sought to share information with : Case Manager Permission granted to share information with : Yes, Verbal Permission Granted  Share Information with NAME: Case Manager           Emotional Assessment              Admission diagnosis:  Acute on chronic heart failure ([place]) [I50.9] Acute respiratory failure with hypoxia ([place]) [J96.01] Acute on chronic congestive heart failure, unspecified heart failure type ([place]) [I50.9] Patient Active Problem List   Diagnosis Date Noted   Acute on chronic heart  failure ([place]) 06/19/2021   Acute respiratory failure with hypoxia ([place]) 06/19/2021   Retinal artery occlusion, 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   Vitamin D deficiency 10/02/2020   AKI (acute kidney injury) ([place]) 07/21/2020   Right elbow pain 05/06/2020   Throat pain 11/04/2019   Neuropathy 11/04/2019   Insomnia 03/09/2019   Daytime somnolence 07/17/2018   Paresthesia of bilateral legs 07/17/2018   Acute pharyngitis 07/17/2018   Greater trochanteric bursitis of left hip 01/30/2018   Low back pain 06/21/2017   Lateral epicondylitis of right elbow 06/20/2017   HLD (hyperlipidemia) 08/24/2016   Right shoulder pain 08/24/2016   Debility 08/10/2016   Viral illness 06/13/2016   Headache 12/09/2015   Degenerative arthritis of left knee 11/23/2015   Acute renal failure syndrome ([place]) 02/04/2015   Bilateral foot pain 01/26/2015   History of knee replacement procedure of right knee 08/06/2014   Arthritis of left lower extremity 08/06/2014   Cellulitis of leg, left 04/14/2014   Acute bronchitis 04/14/2014   Chronic venous insufficiency 04/14/2014   Left leg pain 03/12/2014   Patellar tendonitis of left knee 07/31/2013   Superficial phlebitis 01/31/2013   Chronic pain of right knee 01/31/2013   Diabetes ([place]) 01/31/2013   Allergic rhinitis 01/31/2013   Hypertrophic obstructive cardiomyopathy ([place]) 05/03/2012   Left lumbar radiculopathy 11/22/2011   Right knee pain 09/13/2011   Cough 09/13/2011  Arthritis    Mild persistent asthma    Cervical cancer (HCC)    GERD (gastroesophageal reflux disease)    Hypertension    Kidney stones    CHF (congestive heart failure) ([place])    Encounter for well adult exam with abnormal findings 09/10/2011   PCP:  [name], MD Pharmacy:   [id number] - [place], [place] [address] Phone: [phone] Fax: [phone]  [place], [place] [address] Phone: [phone] Fax: [phone]  [place], [place] [address] [place] [address] Phone: [phone] Fax: [phone]     Social Determinants of Health ([place]) Interventions    Readmission Risk Interventions Readmission Risk Prevention Plan 06/20/2021  Transportation Screening Complete  PCP or Specialist Appt within 5-7 Days Complete  Home Care Screening Complete  Medication Review (RN CM) Complete  Some recent data might be hidden

## 2021-06-20 NOTE — Consult Note (Addendum)
Cardiology Consultation:   Patient ID: Alyssa Ingram MRN: [id number]; DOB: 12/31/1945  Admit date: 06/19/2021 Date of Consult: 06/20/2021  PCP:  [name], MD   [facility] Providers Cardiologist:  Dr. [name] Breed in 2018 - new to Dr. Johney Frame }     Patient Profile:   Alyssa Ingram is a 76 y.o. female with a hx of HOCM, HTN, DM2, HLD, GERD, HFpEF, and anemia who is being seen 06/20/2021 for the evaluation of shortness of breath at the request of Dr. Grandville Silos.  History of Present Illness:   Alyssa Ingram previously lived in [place] and followed with Dr. [name] Breed after moving to [facility] - last seen in 2018. From his records: nuclear stress test 03/2010 with EF 62% and normal perfusion. ETT 06/2012 with poor exercise capacity, but no hypotension. Carotid dopplers 05/2012 with no significant disease. Heart monitor 06/2015 with SR, PVCs and PACs. Echo 06/2015 with vigorous LV function, severe asymmetric LVH, grade 1 DD, severe LAE, consistent with HOCM. She was unable to walk on a treadmill in 2018 to evaluate systolic BP response. Cardizem was D/C'ed and metoprolol was increased to 75 mg BID at her last visit in 2018.   Does not appear she has followed with cardiology since 2018. She presented to [facility] via EMS for worsening SOB x 3 weeks and associated chest tightness x 2-3 days. Inhaler at home did not improve symptoms. She initially thought she had indigestion, but this did not improve with conservative measures. EMS arrival with O2 80% on room air improved with NRB, but subsequently required BiPAP. BNP was elevated and CXR consistent with CHF. IV diuresis started for acute on chronic diastolic heart failure, acute respiratory failure, and HOCM. Cardiology consulted for further management.   Currently diuresing on 60 mg IV lasix BID. Home dose is 60 mg BID.   She does admit to noncompliance with her evening lasix dose due to urinary incontinence. She states she has limited fried foods lately, but  recently had fried shrimp. No chest pain. Initially, she may have had a component of flight of ideas but with further conversation she is oriented and answers questions appropriately.    Past Medical History:  Diagnosis Date   Abnormal MRI, spine 11/2011   ?discitis L5-S1, s/p CT bx 12/12/11   Allergic rhinitis, cause unspecified 01/31/2013   Arthritis    Asthma    Cervical cancer ([place])    Chronic diastolic heart failure (HCC)    Diabetic retinopathy (HCC)    GERD (gastroesophageal reflux disease)    H/O cardiovascular stress test    Nuclear study in 2011 normal perfusion   HOCM (hypertrophic obstructive cardiomyopathy) ([place])    Echo 12/19/11: Severe LVH, EF 55-65%, dynamic obstruction, wall motion normal, grade 1 diastolic dysfunction, systolic anterior motion of the mitral valve with mild MS and mild MR, mild LAE, PASP 34   Hyperlipidemia    Hypertension    Kidney stones    Obesity    Type II or unspecified type diabetes mellitus without mention of complication, uncontrolled 01/31/2013    Past Surgical History:  Procedure Laterality Date   ABDOMINAL HYSTERECTOMY     JOINT REPLACEMENT     right knee 2006   rotater cuff     right, 2005     Home Medications:  Prior to Admission medications   Medication Sig Start Date End Date Taking? Authorizing Provider  albuterol (ACCUNEB) 0.63 MG/3ML nebulizer solution INHALE 1 VIAL BY MOUTH VIA NEBULIZATION EVERY 6 HOURS  AS NEEDED FOR WHEEZING. Patient taking differently: Take 1 ampule by nebulization every 6 (six) hours as needed for wheezing or shortness of breath. 04/03/18  Yes [name], MD  albuterol [facility] HFA) [phone] Base) MCG/ACT inhaler Inhale 2 puffs into the lungs 2 (two) times daily. Patient taking differently: Inhale 2 puffs into the lungs 2 (two) times daily. May repeat if needed 06/13/16  Yes [name], MD  allopurinol (ZYLOPRIM) 100 MG tablet Take 1 tablet (100 mg total) by mouth daily. 10/18/20  Yes [name], MD   ammonium lactate (AMLACTIN) 12 % cream APPLY TOPICALLY AS NEEDED FOR DRY SKIN. 05/16/18  Yes Trula Slade, DPM  aspirin 81 MG tablet Take 162 mg by mouth daily.    Yes [provider]  atorvastatin (LIPITOR) 80 MG tablet Take 1 tablet (80 mg total) by mouth daily. 09/28/20  Yes [name], MD  cetirizine (ZYRTEC) 10 MG tablet TAKE 1 TABLET BY MOUTH EVERY DAY AS NEEDED Patient taking differently: Take 10 mg by mouth daily. 11/17/20  Yes [name], MD  Cholecalciferol (D3 ADULT PO) Take 1 tablet by mouth daily.   Yes [provider]  diltiazem (CARDIZEM CD) 180 MG 24 hr capsule TAKE 1 CAPSULE BY MOUTH EVERY DAY Patient taking differently: Take 180 mg by mouth daily. 05/10/21  Yes [name], MD  DULoxetine (CYMBALTA) 60 MG capsule Take 1 capsule (60 mg total) by mouth daily. 09/28/20 09/28/21 Yes [name], MD  furosemide (LASIX) 20 MG tablet TAKE 3 TABLETS (60 MG TOTAL) BY MOUTH 2 TIMES DAILY. Patient taking differently: Take 60 mg by mouth 2 (two) times daily. 03/21/21  Yes [name], MD  glipiZIDE (GLUCOTROL XL) 2.5 MG 24 hr tablet Take 1 tablet (2.5 mg total) by mouth daily with breakfast. 01/18/21  Yes [name], MD  meclizine (ANTIVERT) 25 MG tablet TAKE 1 TABLET BY MOUTH THREE TIMES A DAY as needed Patient taking differently: Take 25 mg by mouth 3 (three) times daily as needed for dizziness or nausea. 01/18/21  Yes [name], MD  metoprolol tartrate (LOPRESSOR) 50 MG tablet Take 1 tablet (50 mg total) by mouth 2 (two) times daily. 10/21/20  Yes [name], MD  Multiple Vitamin (MULTIVITAMIN ADULT PO) Take 1 tablet by mouth daily.   Yes [provider]  pantoprazole (PROTONIX) 40 MG tablet TAKE 1 TABLET BY MOUTH EVERY DAY Patient taking differently: Take 40 mg by mouth daily. 10/22/20  Yes [name], MD  triamcinolone (NASACORT AQ) 55 MCG/ACT AERO nasal inhaler Place 2 sprays into the nose daily. Patient taking differently: Place 2 sprays  into the nose daily as needed (rhinitis). 12/31/18  Yes [name], MD  Blood Glucose Monitoring Suppl (ONE TOUCH ULTRA 2) w/Device KIT Use as directed three times daily E11.9 06/11/20   [name], MD  Continuous Blood Gluc Receiver (FREESTYLE LIBRE READER) DEVI Apply 1 Device topically 4 (four) times daily as needed. E11.9 06/07/20   [name], MD  Continuous Blood Gluc Sensor (FREESTYLE LIBRE 14 DAY SENSOR) MISC Apply 1 Device topically every 14 (fourteen) days. E11.9 06/07/20   [name], MD  diltiazem (CARDIZEM CD) 120 MG 24 hr capsule Take 1 capsule (120 mg total) by mouth daily. Patient not taking: Reported on 06/19/2021 07/26/20 08/25/20  Dessa Phi, DO  gabapentin (NEURONTIN) 100 MG capsule Take 2 capsules (200 mg total) by mouth 2 (two)  times daily. Patient not taking: Reported on 06/19/2021 07/26/20 08/25/20  Dessa Phi, DO  glucose blood [facility] ULTRA) test strip Use as instructed three times daily E11.9 06/11/20   [name], MD  Lancets MISC Use as directed three times daily E11.9 06/11/20   [name], MD  lidocaine (LIDODERM) 5 % Place 1 patch onto the skin daily. Remove & Discard patch within 12 hours or as directed by MD Patient not taking: Reported on 06/19/2021 01/30/18   [name], MD    Inpatient Medications: Scheduled Meds:  albuterol  2 puff Inhalation BID   allopurinol  100 mg Oral Daily   aspirin EC  81 mg Oral Daily   atorvastatin  80 mg Oral Daily   chlorhexidine  15 mL Mouth Rinse BID   Chlorhexidine Gluconate Cloth  6 each Topical Daily   diltiazem  180 mg Oral Daily   DULoxetine  60 mg Oral Daily   enoxaparin (LOVENOX) injection  40 mg Subcutaneous Q24H   furosemide  60 mg Intravenous BID   insulin aspart  0-15 Units Subcutaneous TID WC   insulin aspart  0-5 Units Subcutaneous QHS   loratadine  10 mg Oral Daily   mouth rinse  15 mL Mouth Rinse q12n4p   metoprolol tartrate  50 mg Oral BID   multivitamin with minerals  1 tablet Oral Daily    pantoprazole  40 mg Oral Daily   potassium chloride  40 mEq Oral BID   Continuous Infusions:  PRN Meds: acetaminophen **OR** acetaminophen, albuterol, meclizine, ondansetron **OR** ondansetron (ZOFRAN) IV  Allergies:    Allergies  Allergen Reactions   Sulfa Antibiotics Hives   Metformin And Related Other (See Comments)    intolerance   Other Hives    Unknown drug   Prednisone Swelling    Social History:   Social History   Socioeconomic History   Marital status: Widowed    Spouse name: Not on file   Number of children: 5   Years of education: 14   Highest education level: Not on file  Occupational History   Occupation: Retired Optometrist  Tobacco Use   Smoking status: Never   Smokeless tobacco: Never  Substance and Sexual Activity   Alcohol use: No    Alcohol/week: 0.0 standard drinks   Drug use: No   Sexual activity: Not on file  Other Topics Concern   Not on file  Social History Narrative   Not on file   Social Determinants of Health   Financial Resource Strain: Not on file  Food Insecurity: Not on file  Transportation Needs: Not on file  Physical Activity: Not on file  Stress: Not on file  Social Connections: Not on file  Intimate Partner Violence: Not on file    Family History:    Family History  Problem Relation Age of Onset   Heart disease Father    Arthritis Other    Heart disease Other    Hypertension Other    Diabetes Other    Alcohol abuse Other    Arthritis Other    Cancer Other        Lung Cancer   Heart disease Other    Hypertension Other    Sudden death Other    Kidney disease Other    Mental illness Other    Diabetes Other    Colon cancer Neg Hx      ROS:  Please see the history of present illness.   All other  ROS reviewed and negative.     Physical Exam/Data:   Vitals:   06/20/21 0500 06/20/21 0600 06/20/21 0700 06/20/21 0808  BP: 138/73 131/71 (!) 118/59   Pulse: 78 83 70   Resp: (!) _0 Temp:    98.4 F  (36.9 C)  TempSrc:    Oral  SpO2: 99% 100% 100%   Weight: 83.7 kg     Height:        Intake/Output Summary (Last 24 hours) at 06/20/2021 0921 Last data filed at 06/19/2021 2130 Gross per 24 hour  Intake 516.97 ml  Output 2100 ml  Net -1583.03 ml   Last 3 Weights 06/20/2021 06/19/2021 01/18/2021  Weight (lbs) 184 lb 8.4 oz 186 lb 4.6 oz 189 lb 9.6 oz  Weight (kg) 83.7 kg 84.5 kg 86.002 kg     Body mass index is 36.04 kg/m.  General:  obese female in NAD, on O2 HEENT: normal Neck: no JVD Vascular: No carotid bruits; Distal pulses 2+ bilaterally Cardiac:  RRR,  Lungs:  clear to auscultation bilaterally, no wheezing, rhonchi or rales  Abd: soft, nontender, no hepatomegaly  Ext: no edema Musculoskeletal:  No deformities, BUE and BLE strength normal and equal Skin: warm and dry  Neuro:  CNs 2-12 intact, no focal abnormalities noted Psych:  Normal affect   EKG:  The EKG was personally reviewed and demonstrates:  sinus rhythm with HR 74, LVH, left atrial enlargement Telemetry:  Telemetry was personally reviewed and demonstrates:  sinus rhythm with HR 70s-80s  Relevant CV Studies:  Echo 06/19/21:  1. IVS thickneess ~24 mm at the basal septum. LVOT gradients not fully  assessed. HOCM. Left ventricular ejection fraction, by estimation, is 70  to 75%. The left ventricle has hyperdynamic function. The left ventricle  has no regional wall motion  abnormalities. There is severe asymmetric left ventricular hypertrophy of  the basal-septal segment. Left ventricular diastolic parameters are  indeterminate.   2. Right ventricular systolic function is normal. The right ventricular  size is normal. There is severely elevated pulmonary artery systolic  pressure.   3. Left atrial size was severely dilated.   4. Right atrial size was severely dilated.   5. MV mean gradient 9.0 mmhg HR 85 bpm. Calcified valve. MVA 2.3 cm2. The  mitral valve is degenerative. Mild mitral valve regurgitation. Mild to   moderate mitral stenosis.   6. Aortic valve regurgitation is not visualized. Aortic valve  sclerosis/calcification is present, without any evidence of aortic  stenosis.   7. The inferior vena cava is dilated in size with >50% respiratory  variability, suggesting right atrial pressure of 8 mmHg.   Laboratory Data:  High Sensitivity Troponin:   Recent Labs  Lab 06/19/21 0759  TROPONINIHS 16     Chemistry Recent Labs  Lab 06/19/21 0759 06/19/21 1224 06/20/21 0249  NA 138  --  140  K 3.3*  --  4.1  CL 105  --  106  CO2 25  --  26  GLUCOSE 206*  --  149*  BUN 17  --  18  CREATININE 0.91  --  0.85  CALCIUM 8.3*  --  8.5*  MG  --  2.0  --   GFRNONAA >60  --  >60  ANIONGAP 8  --  8    Recent Labs  Lab 06/19/21 0759 06/20/21 0249  PROT 6.6 6.8  ALBUMIN 3.1* 3.1*  AST 19 14*  ALT 12 12  ALKPHOS 88 80  BILITOT 0.5 0.6   Lipids No results for input(s): CHOL, TRIG, HDL, LABVLDL, LDLCALC, CHOLHDL in the last 168 hours.  Hematology Recent Labs  Lab 06/19/21 0759 06/20/21 0249  WBC 13.4* 11.6*  RBC 3.73* 3.58*  HGB 10.8* 10.3*  HCT 34.2* 31.5*  MCV 91.7 88.0  MCH 29.0 28.8  MCHC 31.6 32.7  RDW 15.2 14.7  PLT 254 249   Thyroid No results for input(s): TSH, FREET4 in the last 168 hours.  BNP Recent Labs  Lab 06/19/21 0759  BNP 896.0*    DDimer No results for input(s): DDIMER in the last 168 hours.   Radiology/Studies:  DG Chest Portable 1 View  Result Date: 06/19/2021 CLINICAL DATA:  76 year old female with shortness of breath since 0400 hours. Wheezing and abnormal pulmonary auscultation. EXAM: PORTABLE CHEST 1 VIEW COMPARISON:  Portable chest 07/21/2020 and earlier. FINDINGS: Portable AP semi upright view at 0823 hours. Rotated to the left. Similar lung volumes. Mild cardiomegaly suspected. Other mediastinal contours are within normal limits. Visualized tracheal air column is within normal limits. Streaky and indistinct opacity at both lung bases. But upper  lung pulmonary vascularity seems to remain normal. No pneumothorax. No air bronchograms or definite effusion. Negative visible bowel gas. Chronic degenerative changes at the right shoulder. No acute osseous abnormality identified. IMPRESSION: Acute nonspecific bibasilar opacity. Top differential considerations include basilar predominant pulmonary edema, infection, less likely atelectasis. No definite effusion. Electronically Signed   By: Genevie Ann M.D.   On: 06/19/2021 08:44   DG Shoulder Right Portable  Result Date: 06/19/2021 CLINICAL DATA:  Recent falls.  Right superior shoulder pain. EXAM: RIGHT SHOULDER - 1 VIEW COMPARISON:  Chest radiograph-earlier same day FINDINGS: Examination is degraded due to patient body habitus. No definite fracture or dislocation. Severe degenerative change of the right glenohumeral joint with joint space loss, subchondral sclerosis and osteophytosis. Moderate degenerative change of the right AC joint with joint space loss, subchondral sclerosis and inferiorly directed osteophytosis. Erosions are noted about the rotator cuff insertion site. No evidence of calcific tendinitis. Limited visualization of the adjacent thorax demonstrates enlarged cardiac silhouette with indistinct pulmonary vasculature, potentially accentuated due to obliquity and technique. IMPRESSION: 1. No acute findings. 2. 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 Clinical correlation is advised. 3. Moderate degenerative change of the right AC joint. 4. Suspected cardiomegaly and pulmonary edema, incompletely evaluated. Electronically Signed   By: Sandi Mariscal M.D.   On: 06/19/2021 09:43   ECHOCARDIOGRAM COMPLETE  Result Date: 06/19/2021    ECHOCARDIOGRAM REPORT   Patient Name:   Alyssa Ingram Date of Exam: 06/19/2021 Medical Rec #:  [id number]        Height:       60.0  in Accession #:    [id number]       Weight:       186.3 lb Date of Birth:  [birth year]        BSA:          1.811 m Patient Age:    71 years         BP:           166/90 mmHg Patient Gender: F                HR:           85 bpm. Exam Location:  Inpatient Procedure: 2D Echo Indications:  CHF  History:        Patient has prior history of Echocardiogram examinations, most                 recent 07/24/2020. CHF; Risk Factors:Diabetes and Hypertension.  Sonographer:    Jefferey Pica Referring Phys: [id number] [place]  1. IVS thickneess ~24 mm at the basal septum. LVOT gradients not fully assessed. HOCM. Left ventricular ejection fraction, by estimation, is 70 to 75%. The left ventricle has hyperdynamic function. The left ventricle has no regional wall motion abnormalities. There is severe asymmetric left ventricular hypertrophy of the basal-septal segment. Left ventricular diastolic parameters are indeterminate.  2. Right ventricular systolic function is normal. The right ventricular size is normal. There is severely elevated pulmonary artery systolic pressure.  3. Left atrial size was severely dilated.  4. Right atrial size was severely dilated.  5. MV mean gradient 9.0 mmhg HR 85 bpm. Calcified valve. MVA 2.3 cm2. The mitral valve is degenerative. Mild mitral valve regurgitation. Mild to moderate mitral stenosis.  6. Aortic valve regurgitation is not visualized. Aortic valve sclerosis/calcification is present, without any evidence of aortic stenosis.  7. The inferior vena cava is dilated in size with >50% respiratory variability, suggesting right atrial pressure of 8 mmHg. Comparison(s): No significant change from prior study. FINDINGS  Left Ventricle: IVS thickneess ~24 mm at the basal septum. LVOT gradients not fully assessed. HOCM. Left ventricular ejection fraction, by estimation, is 70 to 75%. The left ventricle has hyperdynamic function. The left ventricle has no regional wall motion  abnormalities. The left ventricular internal cavity size was small. There is severe asymmetric left ventricular hypertrophy of the basal-septal segment. Left ventricular diastolic parameters are indeterminate. Right Ventricle: The right ventricular size is normal. No increase in right ventricular wall thickness. Right ventricular systolic function is normal. There is severely elevated pulmonary artery systolic pressure. The tricuspid regurgitant velocity is 4.07 m/s, and with an assumed right atrial pressure of 8 mmHg, the estimated right ventricular systolic pressure is 78.2 mmHg. Left Atrium: Left atrial size was severely dilated. Right Atrium: Right atrial size was severely dilated. Pericardium: There is no evidence of pericardial effusion. Mitral Valve: MV mean gradient 9.0 mmhg HR 85 bpm. Calcified valve. MVA 2.3 cm2. The mitral valve is degenerative in appearance. There is severe calcification of the mitral valve leaflet(s). Mild mitral valve regurgitation. Mild to moderate mitral valve stenosis. MV peak gradient, 17.5 mmHg. The mean mitral valve gradient is 9.0 mmHg. Tricuspid Valve: The tricuspid valve is grossly normal. Tricuspid valve regurgitation is mild. Aortic Valve: Aortic valve regurgitation is not visualized. Aortic valve sclerosis/calcification is present, without any evidence of aortic stenosis. Aortic valve mean gradient measures 9.5 mmHg. Aortic valve peak gradient measures 21.4 mmHg. Pulmonic Valve: The pulmonic valve was grossly normal. Pulmonic valve regurgitation is not visualized. Aorta: The aortic root and ascending aorta are structurally normal, with no evidence of dilitation. Venous: The inferior vena cava is dilated in size with greater than 50% respiratory variability, suggesting right atrial pressure of 8 mmHg. IAS/Shunts: No atrial level shunt detected by color flow Doppler.  LEFT VENTRICLE PLAX 2D LVIDd:         4.30 cm Diastology LVIDs:         1.30 cm LV e' medial:    3.25 cm/s LV  PW:         1.50 cm LV E/e' medial:  49.8 LV IVS:        2.30  cm LV e' lateral:   3.62 cm/s                        LV E/e' lateral: 44.8  RIGHT VENTRICLE             IVC RV Basal diam:  3.10 cm     IVC diam: 2.20 cm RV Mid diam:    2.70 cm RV S prime:     17.60 cm/s TAPSE (M-mode): 2.3 cm LEFT ATRIUM              Index        RIGHT ATRIUM           Index LA diam:        5.10 cm  2.82 cm/m   RA Area:     16.80 cm LA Vol (A2C):   131.0 ml 72.33 ml/m  RA Volume:   49.70 ml  27.44 ml/m LA Vol (A4C):   113.0 ml 62.39 ml/m LA Biplane Vol: 123.0 ml 67.91 ml/m  AORTIC VALVE                    PULMONIC VALVE AV Vmax:           231.50 cm/s  PV Vmax:       1.04 m/s AV Vmean:          138.500 cm/s PV Peak grad:  4.3 mmHg AV VTI:            0.392 m AV Peak Grad:      21.4 mmHg AV Mean Grad:      9.5 mmHg LVOT Vmax:         197.00 cm/s LVOT Vmean:        114.000 cm/s LVOT VTI:          0.350 m LVOT/AV VTI ratio: 0.89  AORTA Ao Root diam: 3.30 cm Ao Asc diam:  3.40 cm MITRAL VALVE                TRICUSPID VALVE MV Area (PHT): 2.39 cm     TR Peak grad:   66.3 mmHg MV Peak grad:  17.5 mmHg    TR Vmax:        407.00 cm/s MV Mean grad:  9.0 mmHg MV Vmax:       2.09 m/s     SHUNTS MV Vmean:      144.0 cm/s   Systemic VTI: 0.35 m MV Decel Time: 318 msec MR Peak grad: 132.7 mmHg MR Vmax:      576.00 cm/s MV E velocity: 162.00 cm/s MV A velocity: 156.00 cm/s MV E/A ratio:  1.04 Landscape architect signed by Phineas Inches Signature Date/Time: 06/19/2021/3:36:55 PM    Final      Assessment and Plan:   Acute on chronic diastolic heart failure HOCM Acute respiratory failure - severe biatrial enlargement, mild MR, severely elevated PA pressure - recent medication noncompliance with lasix and dietary indiscretion - BNP 896, CXR with pulmonary edema - BiPAP weaned off, now on 3L [place] - diuresing on 60 mg IV lasix BID - home dose is 60 mg BID  - 2.1 L urine output overnight - HR in the 70-80s  --> will increase metoprolol to  75 mg BID - due to initial concern for marginal pressure, I discontinued cardizem 180 mg (appears she is on 120 mg at home) - however, she had adequate pressure during my exam - may  add back 120 mg cardizem later this morning after increased lopressor pending BP, avoid hypotension - keep lasix at 60 mg IV BID, do not suspect she will  need a prolonged course of IV diuresis   Hypertension - cardizem was continued from home med -  I have discontinued this given borderline BP for more diuresis room - increase metoprolol as above - if pressure stable, will add back low does of cardizem CD   Hyperlipidemia 01/18/2021: Cholesterol 191; HDL 57.00; LDL Cholesterol 115; Triglycerides 98.0; VLDL 19.6 On 80 mg lipitor - unclear if she has been taking this   Lower extremity edema Chronic skin changes and appearance felt more consistent with lymphedema Legs are tender and warm - may consider a component of ?superimposed cellulitis, WBC mildly increase, will defer to primary     Risk Assessment/Risk Scores:   New York Heart Association (NYHA) Functional Class NYHA Class IV       For questions or updates, please contact [facility] Please consult [email or web address] for contact info under    Signed, Ledora Bottcher, [place]  06/20/2021 9:21 AM  Patient seen and examined and agree with Fabian Sharp, PA as detailed above.  In brief, the patient is a 76 y.o. female with a hx of HOCM, HTN, DM2, HLD, GERD, HFpEF, and anemia who presented with worsening SOB and LE edema concerning for acute on chronic diastolic HF exacerbation for which Cardiology has been consulted.  Patient has known history of HOCM but has not followed up with Cardiology since 2018. She presented on this admission with progressive SOB and chest tightness over the past several weeks. On EMS arrival she was hypoxic ultimately requiring BiPAP. She was given lasix with improvement and weaned to [facility].  TTE 02/05 with LVEF 70-75%, severe  hypertrophy of the basal septum, peak gradient through AoV 47mHg, peak LVOT gradient 17, severe PHTN (PASP 714mg) mild-to-mod MS.   Currently, she remains volume overloaded but with improved respiratory status. Responding well to lasix 6052mV BID.   GEN: Elderly female, NAD  Neck: JVD to mid-neck Cardiac: RRR, 2/6 harsh systolic murmur Respiratory: Clear to auscultation bilaterally. GI: Soft, nontender, non-distended  MS: Has chronic lympedema skin changes with 2+ LE edema. Also with erythema on upper shins Neuro:  Nonfocal  Psych: Normal affect    Plan: -Continue diuresis with lasix 32m44m BID -Will up-titrate BB as tolerated; ideally would like to lower HR to the 60s as tolerated -Can add back CCB if BP room -Gradient not significantly elevated on TTE; will need continued monitoring as out-patient -Monitor I/Os and daily weights -LE erythematous; will defer to primary if concern for cellulitis vs chronic venous stasis changes  HeatGwyndolyn Kaufman

## 2021-06-20 NOTE — Assessment & Plan Note (Signed)
Continue statin. 

## 2021-06-20 NOTE — Assessment & Plan Note (Signed)
-   Resume home regiment cholecalciferol.   -Outpatient follow-up.

## 2021-06-20 NOTE — Assessment & Plan Note (Addendum)
-   Hemoglobin A1c 6.1 (06/19/2021) -Continue to hold oral hypoglycemic agents.  -SSI.  -glycemic trends remain stable

## 2021-06-20 NOTE — Assessment & Plan Note (Addendum)
-   Continue diuresis for acute CHF exacerbation. -Cardizem changed to 180mg  daily -Lopressor changed to coreg per above  -Cardiology recommending repeat 2D echo in the outpatient setting.

## 2021-06-20 NOTE — Plan of Care (Signed)
°  Problem: Activity: Goal: Capacity to carry out activities will improve Outcome: Progressing   Problem: Cardiac: Goal: Ability to achieve and maintain adequate cardiopulmonary perfusion will improve Outcome: Progressing   Problem: Education: Goal: Ability to demonstrate management of disease process will improve Outcome: Not Progressing

## 2021-06-20 NOTE — Assessment & Plan Note (Signed)
-   Outpatient follow-up °

## 2021-06-20 NOTE — Assessment & Plan Note (Addendum)
-   Patient presented worsening shortness of breath and noted to be in acute CHF. -Secondary to missed doses of diuretics over several days, dietary indiscretion. -Patient with history of HOCM. -BNP noted to be elevated at 896 on presentation. -Chest x-ray concerning for basilar pulmonary edema. -Patient noted to have required BiPAP on admission. -remains off bipap and on minimal O2 support -Improved after receiving Lasix 60 mg every 12 hours, ultimately transitioning to 40mg  PO lasix -On dilt, increased to 180mg  -Cont coreg 25mg  bid -Continue Lipitor. -Strict I's and O's, daily weights. - Due to complex cardiac history cardiology consulted and following.

## 2021-06-20 NOTE — Assessment & Plan Note (Addendum)
Continue Neurontin. 

## 2021-06-20 NOTE — Hospital Course (Addendum)
Patient 76 year old female history of hypertension, hokum, HFpEF, hyperlipidemia, diabetes type 2, GERD presented with worsening shortness of breath which started on the morning of admission.  Patient initially felt was having indigestion and gas tried some ginger ale with no improvement dyspnea persisted and got worse so family called EMS.  Patient noted to have missed several nighttime doses of her normal diuretics with recent increase in dietary indiscretion.  Patient noted to have elevated BNP, initially required BiPAP on admission and noted to be in acute CHF exacerbation.  Patient placed on IV Lasix.  Noted to have soft/borderline blood pressure admitted to the stepdown unit.  Cardiology consulted medications adjusted.

## 2021-06-21 DIAGNOSIS — I10 Essential (primary) hypertension: Secondary | ICD-10-CM

## 2021-06-21 DIAGNOSIS — I509 Heart failure, unspecified: Secondary | ICD-10-CM

## 2021-06-21 DIAGNOSIS — J9601 Acute respiratory failure with hypoxia: Secondary | ICD-10-CM

## 2021-06-21 DIAGNOSIS — L03116 Cellulitis of left lower limb: Secondary | ICD-10-CM

## 2021-06-21 LAB — CBC WITH DIFFERENTIAL/PLATELET
Abs Immature Granulocytes: 0.05 10*3/uL (ref 0.00–0.07)
Basophils Absolute: 0 10*3/uL (ref 0.0–0.1)
Basophils Relative: 0 %
Eosinophils Absolute: 0.1 10*3/uL (ref 0.0–0.5)
Eosinophils Relative: 1 %
HCT: 32.6 % — ABNORMAL LOW (ref 36.0–46.0)
Hemoglobin: 10.6 g/dL — ABNORMAL LOW (ref 12.0–15.0)
Immature Granulocytes: 0 %
Lymphocytes Relative: 14 %
Lymphs Abs: 1.7 10*3/uL (ref 0.7–4.0)
MCH: 29 pg (ref 26.0–34.0)
MCHC: 32.5 g/dL (ref 30.0–36.0)
MCV: 89.1 fL (ref 80.0–100.0)
Monocytes Absolute: 0.7 10*3/uL (ref 0.1–1.0)
Monocytes Relative: 5 %
Neutro Abs: 9.7 10*3/uL — ABNORMAL HIGH (ref 1.7–7.7)
Neutrophils Relative %: 80 %
Platelets: 278 10*3/uL (ref 150–400)
RBC: 3.66 MIL/uL — ABNORMAL LOW (ref 3.87–5.11)
RDW: 14.8 % (ref 11.5–15.5)
WBC: 12.2 10*3/uL — ABNORMAL HIGH (ref 4.0–10.5)
nRBC: 0 % (ref 0.0–0.2)

## 2021-06-21 LAB — FOLATE: Folate: 12.2 ng/mL (ref >5.9)

## 2021-06-21 LAB — GLUCOSE, CAPILLARY
Glucose-Capillary: 87 mg/dL (ref 70–99)
Glucose-Capillary: 99 mg/dL (ref 70–99)
Glucose-Capillary: 80 mg/dL (ref 70–99)
Glucose-Capillary: 141 mg/dL — ABNORMAL HIGH (ref 70–99)

## 2021-06-21 LAB — VITAMIN B12: Vitamin B-12: 847 pg/mL (ref 180–914)

## 2021-06-21 LAB — BASIC METABOLIC PANEL
Anion gap: 8 (ref 5–15)
BUN: 24 mg/dL — ABNORMAL HIGH (ref 8–23)
CO2: 29 mmol/L (ref 22–32)
Calcium: 8.9 mg/dL (ref 8.9–10.3)
Chloride: 103 mmol/L (ref 98–111)
Creatinine, Ser: 1 mg/dL (ref 0.44–1.00)
GFR, Estimated: 59 mL/min — ABNORMAL LOW (ref >60)
Glucose, Bld: 102 mg/dL — ABNORMAL HIGH (ref 70–99)
Potassium: 4.6 mmol/L (ref 3.5–5.1)
Sodium: 140 mmol/L (ref 135–145)

## 2021-06-21 LAB — MAGNESIUM: Magnesium: 1.7 mg/dL (ref 1.7–2.4)

## 2021-06-21 LAB — AMMONIA: Ammonia: 24 umol/L (ref 9–35)

## 2021-06-21 MED ORDER — MAGNESIUM SULFATE 4 GM/100ML IV SOLN
4.0000 g | Freq: Once | INTRAVENOUS | Status: AC
  Administered 2021-06-21: 4 g via INTRAVENOUS
  Filled 2021-06-21: qty 100

## 2021-06-21 MED ORDER — DILTIAZEM HCL ER COATED BEADS 120 MG PO CP24
120.0000 mg | ORAL_CAPSULE | Freq: Every day | ORAL | Status: DC
  Administered 2021-06-21 – 2021-06-24 (×4): 120 mg via ORAL
  Filled 2021-06-21 (×4): qty 1

## 2021-06-21 MED ORDER — CEPHALEXIN 500 MG PO CAPS
500.0000 mg | ORAL_CAPSULE | Freq: Four times a day (QID) | ORAL | Status: DC
  Administered 2021-06-21 – 2021-06-26 (×21): 500 mg via ORAL
  Filled 2021-06-21 (×21): qty 1

## 2021-06-21 NOTE — Progress Notes (Signed)
PROGRESS NOTE    Alyssa Ingram  VQM:[id number] DOB: 1946-01-28 DOA: 06/19/2021 PCP: [name], MD    Chief Complaint  Patient presents with   Shortness of Breath    Brief Narrative:  Patient 76 year old female history of hypertension, hokum, HFpEF, hyperlipidemia, diabetes type 2, GERD presented with worsening shortness of breath which started on the morning of admission.  Patient initially felt was having indigestion and gas tried some ginger ale with no improvement dyspnea persisted and got worse so family called EMS.  Patient noted to have missed several nighttime doses of her normal diuretics with recent increase in dietary indiscretion.  Patient noted to have elevated BNP, initially required BiPAP on admission and noted to be in acute CHF exacerbation.  Patient placed on IV Lasix.  Noted to have soft/borderline blood pressure admitted to the stepdown unit.  Cardiology consulted medications adjusted.    Assessment & Plan:   Assessment and Plan: * Acute on chronic heart failure ([place]) - Patient presented worsening shortness of breath and noted to be in acute CHF. -Secondary to missed doses of diuretics over several days, dietary indiscretion. -Patient with history of HOCM. -BNP noted to be elevated at 896 on presentation. -Chest x-ray concerning for basilar pulmonary edema. -Patient noted to have required BiPAP on admission. -Currently off BiPAP x 2 days with some clinical improvement. -Patient with sats of 93% on room air. -Urine output of 1.750 L over the past 24 hours. -Current weight of 184.53 pounds from 186.29 pounds (06/19/2021). -Still volume overloaded on examination. -Continue current dose of Lasix 60 mg every 12 hours. -Cardizem discontinued per cardiology to allow more room for diuresis. -Lopressor dose adjusted to 75 mg twice daily per cardiology. -Continue Lipitor. -Strict I's and O's, daily weights. - Due to complex cardiac history cardiology consulted and  following.   Acute respiratory failure with hypoxia (H[facility]) - Secondary to acute on chronic diastolic CHF exacerbation. -Patient on admission required BiPAP with sats of 93% on room air this morning.   -Clinical improvement. -Continue IV Lasix. -See above problem #1 of CHF exacerbation. -Discontinued BiPAP from room.  Hypertrophic obstructive cardiomyopathy ([place])- (present on admission) - Continue diuresis for acute CHF exacerbation. -Cardizem held per cardiology.   -Lopressor dose adjusted to 75 mg twice daily per cardiology.   -Cardiology recommending repeat 2D echo in the outpatient setting.   -Per cardiology.   Hypertension- (present on admission) - Blood pressure noted to be borderline. -Patient on IV Lasix for diuresis. -Cardizem discontinued per cardiology.   -Continue Lopressor.   -Follow.  Neuropathy - Continue Neurontin.   Diabetes ([place]) - Hemoglobin A1c 6.1 (06/19/2021) -CBG 87 this morning.   -Continue to hold oral hypoglycemic agents.   -SSI.   HLD (hyperlipidemia)- (present on admission) - Continue statin.  GERD (gastroesophageal reflux disease)- (present on admission) - PPI.  Vitamin D deficiency- (present on admission) - Resume home regiment cholecalciferol.   -Outpatient follow-up.  Chronic venous insufficiency- (present on admission) - Outpatient follow-up.  Degenerative arthritis of left knee- (present on admission) - Outpatient follow-up with orthopedics.  Cellulitis of left leg - Patient with chronic venous stasis changes noted. -Left lower extremity with some erythema, slight warmth, nontender to palpation. -Treat empirically with Keflex.         DVT prophylaxis: Lovenox Code Status: Full Family Communication: Updated patient.  No family at bedside. Disposition: TBD  Status is: Inpatient Remains inpatient appropriate because: Severity of illness  Consultants:  Cardiology: Dr. Johney Frame 06/20/2021  Procedures:   Chest x-ray 06/19/2021 Plain films of the right shoulder 06/19/2021 2D echo 06/19/2021  Antimicrobials:  -Keflex 06/21/2021>>>>>   Subjective: Sitting up in bed eating omelette.  Overall feeling better however still with some shortness of breath.  No chest pain.  No abdominal pain.  Off BiPAP.    Objective: Vitals:   06/21/21 0400 06/21/21 0429 06/21/21 0500 06/21/21 0800  BP: (!) 145/77     Pulse:      Resp: (!) 21     Temp:  98.2 F (36.8 C)  98.7 F (37.1 C)  TempSrc:  Oral  Oral  SpO2:      Weight:   83.7 kg   Height:        Intake/Output Summary (Last 24 hours) at 06/21/2021 0928 Last data filed at 06/21/2021 0444 Gross per 24 hour  Intake 720 ml  Output 1750 ml  Net -1030 ml   Filed Weights   06/19/21 1213 06/20/21 0500 06/21/21 0500  Weight: 84.5 kg 83.7 kg 83.7 kg    Examination:  General exam: NAD Respiratory system: Diffuse crackles.  Fair air movement.  No use of accessory muscles of respiration.  Speaking in full sentences.   Cardiovascular system: Regular rate and rhythm with 3/6 SEM.  Positive JVD.  Bilateral lower extremity with chronic venous stasis changes noted with some hypertrophy, left lower extremity with some erythema.  Trace bilateral lower extremity edema.  Gastrointestinal system: Abdomen is soft, nontender, nondistended, positive bowel sounds.  No rebound.  No guarding. Central nervous system: Alert and oriented. No focal neurological deficits. Extremities: Symmetric 5 x 5 power. Skin: No rashes, lesions or ulcers Psychiatry: Judgement and insight appear fair. Mood & affect appropriate.     Data Reviewed:   CBC: Recent Labs  Lab 06/19/21 0759 06/20/21 0249 06/21/21 0255  WBC 13.4* 11.6* 12.2*  NEUTROABS 9.7*  --  9.7*  HGB 10.8* 10.3* 10.6*  HCT 34.2* 31.5* 32.6*  MCV 91.7 88.0 89.1  PLT 254 249 177    Basic Metabolic Panel: Recent Labs  Lab 06/19/21 0759 06/19/21 1224 06/20/21 0249 06/21/21 0255  NA 138  --  140 140  K 3.3*   --  4.1 4.6  CL 105  --  106 103  CO2 25  --  26 29  GLUCOSE 206*  --  149* 102*  BUN 17  --  18 24*  CREATININE 0.91  --  0.85 1.00  CALCIUM 8.3*  --  8.5* 8.9  MG  --  2.0  --  1.7    GFR: Estimated Creatinine Clearance: 46.7 mL/min (by C-G formula based on SCr of 1 mg/dL).  Liver Function Tests: Recent Labs  Lab 06/19/21 0759 06/20/21 0249  AST 19 14*  ALT 12 12  ALKPHOS 88 80  BILITOT 0.5 0.6  PROT 6.6 6.8  ALBUMIN 3.1* 3.1*    CBG: Recent Labs  Lab 06/20/21 0808 06/20/21 1151 06/20/21 1716 06/20/21 2307 06/21/21 0736  GLUCAP 134* 115* 123* 111* 87     Recent Results (from the past 240 hour(s))  Resp Panel by RT-PCR (Flu A&B, Covid) Nasopharyngeal Swab     Status: None   Collection Time: 06/19/21  9:24 AM   Specimen: Nasopharyngeal Swab; Nasopharyngeal(NP) swabs in vial transport medium  Result Value Ref Range Status   SARS Coronavirus 2 by RT PCR NEGATIVE NEGATIVE Final    Comment: (NOTE) SARS-CoV-2 target nucleic acids are NOT DETECTED.  The SARS-CoV-2 RNA is generally detectable in upper respiratory specimens during the acute phase of infection. The lowest concentration of SARS-CoV-2 viral copies this assay can detect is 138 copies/mL. A negative result does not preclude SARS-Cov-2 infection and should not be used as the sole basis for treatment or other patient management decisions. A negative result may occur with  improper specimen collection/handling, submission of specimen other than nasopharyngeal swab, presence of viral mutation(s) within the areas targeted by this assay, and inadequate number of viral copies(<138 copies/mL). A negative result must be combined with clinical observations, patient history, and epidemiological information. The expected result is Negative.  Fact Sheet for Patients:  EntrepreneurPulse.com.au  Fact Sheet for Healthcare Providers:  IncredibleEmployment.be  This test is no t  yet approved or cleared by the [place] FDA and  has been authorized for detection and/or diagnosis of SARS-CoV-2 by FDA under an Emergency Use Authorization (EUA). This EUA will remain  in effect (meaning this test can be used) for the duration of the COVID-19 declaration under Section 564(b)(1) of the Act, 21 U.S.C.section 360bbb-3(b)(1), unless the authorization is terminated  or revoked sooner.       Influenza A by PCR NEGATIVE NEGATIVE Final   Influenza B by PCR NEGATIVE NEGATIVE Final    Comment: (NOTE) The Xpert Xpress SARS-CoV-2/FLU/RSV plus assay is intended as an aid in the diagnosis of influenza from Nasopharyngeal swab specimens and should not be used as a sole basis for treatment. Nasal washings and aspirates are unacceptable for Xpert Xpress SARS-CoV-2/FLU/RSV testing.  Fact Sheet for Patients: EntrepreneurPulse.com.au  Fact Sheet for Healthcare Providers: IncredibleEmployment.be  This test is not yet approved or cleared by the [place] FDA and has been authorized for detection and/or diagnosis of SARS-CoV-2 by FDA under an Emergency Use Authorization (EUA). This EUA will remain in effect (meaning this test can be used) for the duration of the COVID-19 declaration under Section 564(b)(1) of the Act, 21 U.S.C. section 360bbb-3(b)(1), unless the authorization is terminated or revoked.  Performed at [facility], [place] [address] [place], [place] [address]   MRSA Next Gen by PCR, Nasal     Status: None   Collection Time: 06/19/21 12:17 PM   Specimen: Nasal Mucosa; Nasal Swab  Result Value Ref Range Status   MRSA by PCR Next Gen NOT DETECTED NOT DETECTED Final    Comment: (NOTE) The GeneXpert MRSA Assay (FDA approved for NASAL specimens only), is one component of a comprehensive MRSA colonization surveillance program. It is not intended to diagnose MRSA infection nor to guide or monitor treatment for  MRSA infections. Test performance is not FDA approved in patients less than 2 years old. Performed at [facility], [place] [address] [place], [place] [address]          Radiology Studies: DG Shoulder Right Portable  Result Date: 06/19/2021 CLINICAL DATA:  Recent falls.  Right superior shoulder pain. EXAM: RIGHT SHOULDER - 1 VIEW COMPARISON:  Chest radiograph-earlier same day FINDINGS: Examination is degraded due to patient body habitus. No definite fracture or dislocation. Severe degenerative change of the right glenohumeral joint with joint space loss, subchondral sclerosis and osteophytosis. Moderate degenerative change of the right AC joint with joint space loss, subchondral sclerosis and inferiorly directed osteophytosis. Erosions are noted about the rotator cuff insertion site. No evidence of calcific tendinitis. Limited visualization of the adjacent thorax demonstrates enlarged cardiac silhouette with indistinct pulmonary vasculature, potentially accentuated due to obliquity and technique. IMPRESSION:  1. No acute findings. 2. 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 Clinical correlation is advised. 3. Moderate degenerative change of the right AC joint. 4. Suspected cardiomegaly and pulmonary edema, incompletely evaluated. Electronically Signed   By: Sandi Mariscal M.D.   On: 06/19/2021 09:43   ECHOCARDIOGRAM COMPLETE  Result Date: 06/19/2021    ECHOCARDIOGRAM REPORT   Patient Name:   Alyssa Ingram Date of Exam: 06/19/2021 Medical Rec #:  [id number]        Height:       60.0 in Accession #:    [id number]       Weight:       186.3 lb Date of Birth:  1945/08/29        BSA:          1.811 m Patient Age:    39 years         BP:           166/90 mmHg Patient Gender: F                HR:           85 bpm. Exam Location:  Inpatient Procedure:  2D Echo Indications:    CHF  History:        Patient has prior history of Echocardiogram examinations, most                 recent 07/24/2020. CHF; Risk Factors:Diabetes and Hypertension.  Sonographer:    Jefferey Pica Referring Phys: [id number] [place]  1. IVS thickneess ~24 mm at the basal septum. LVOT gradients not fully assessed. HOCM. Left ventricular ejection fraction, by estimation, is 70 to 75%. The left ventricle has hyperdynamic function. The left ventricle has no regional wall motion abnormalities. There is severe asymmetric left ventricular hypertrophy of the basal-septal segment. Left ventricular diastolic parameters are indeterminate.  2. Right ventricular systolic function is normal. The right ventricular size is normal. There is severely elevated pulmonary artery systolic pressure.  3. Left atrial size was severely dilated.  4. Right atrial size was severely dilated.  5. MV mean gradient 9.0 mmhg HR 85 bpm. Calcified valve. MVA 2.3 cm2. The mitral valve is degenerative. Mild mitral valve regurgitation. Mild to moderate mitral stenosis.  6. Aortic valve regurgitation is not visualized. Aortic valve sclerosis/calcification is present, without any evidence of aortic stenosis.  7. The inferior vena cava is dilated in size with >50% respiratory variability, suggesting right atrial pressure of 8 mmHg. Comparison(s): No significant change from prior study. FINDINGS  Left Ventricle: IVS thickneess ~24 mm at the basal septum. LVOT gradients not fully assessed. HOCM. Left ventricular ejection fraction, by estimation, is 70 to 75%. The left ventricle has hyperdynamic function. The left ventricle has no regional wall motion abnormalities. The left ventricular internal cavity size was small. There is severe asymmetric left ventricular hypertrophy of the basal-septal segment. Left ventricular diastolic parameters are indeterminate. Right Ventricle: The right ventricular size is normal. No increase  in right ventricular wall thickness. Right ventricular systolic function is normal. There is severely elevated pulmonary artery systolic pressure. The tricuspid regurgitant velocity is 4.07 m/s, and with an assumed right atrial pressure of 8 mmHg, the estimated right ventricular systolic pressure is 30.8 mmHg. Left Atrium: Left atrial size was severely dilated. Right Atrium: Right atrial size was severely dilated. Pericardium: There is no  evidence of pericardial effusion. Mitral Valve: MV mean gradient 9.0 mmhg HR 85 bpm. Calcified valve. MVA 2.3 cm2. The mitral valve is degenerative in appearance. There is severe calcification of the mitral valve leaflet(s). Mild mitral valve regurgitation. Mild to moderate mitral valve stenosis. MV peak gradient, 17.5 mmHg. The mean mitral valve gradient is 9.0 mmHg. Tricuspid Valve: The tricuspid valve is grossly normal. Tricuspid valve regurgitation is mild. Aortic Valve: Aortic valve regurgitation is not visualized. Aortic valve sclerosis/calcification is present, without any evidence of aortic stenosis. Aortic valve mean gradient measures 9.5 mmHg. Aortic valve peak gradient measures 21.4 mmHg. Pulmonic Valve: The pulmonic valve was grossly normal. Pulmonic valve regurgitation is not visualized. Aorta: The aortic root and ascending aorta are structurally normal, with no evidence of dilitation. Venous: The inferior vena cava is dilated in size with greater than 50% respiratory variability, suggesting right atrial pressure of 8 mmHg. IAS/Shunts: No atrial level shunt detected by color flow Doppler.  LEFT VENTRICLE PLAX 2D LVIDd:         4.30 cm Diastology LVIDs:         1.30 cm LV e' medial:    3.25 cm/s LV PW:         1.50 cm LV E/e' medial:  49.8 LV IVS:        2.30 cm LV e' lateral:   3.62 cm/s                        LV E/e' lateral: 44.8  RIGHT VENTRICLE             IVC RV Basal diam:  3.10 cm     IVC diam: 2.20 cm RV Mid diam:    2.70 cm RV S prime:     17.60 cm/s TAPSE  (M-mode): 2.3 cm LEFT ATRIUM              Index        RIGHT ATRIUM           Index LA diam:        5.10 cm  2.82 cm/m   RA Area:     16.80 cm LA Vol (A2C):   131.0 ml 72.33 ml/m  RA Volume:   49.70 ml  27.44 ml/m LA Vol (A4C):   113.0 ml 62.39 ml/m LA Biplane Vol: 123.0 ml 67.91 ml/m  AORTIC VALVE                    PULMONIC VALVE AV Vmax:           231.50 cm/s  PV Vmax:       1.04 m/s AV Vmean:          138.500 cm/s PV Peak grad:  4.3 mmHg AV VTI:            0.392 m AV Peak Grad:      21.4 mmHg AV Mean Grad:      9.5 mmHg LVOT Vmax:         197.00 cm/s LVOT Vmean:        114.000 cm/s LVOT VTI:          0.350 m LVOT/AV VTI ratio: 0.89  AORTA Ao Root diam: 3.30 cm Ao Asc diam:  3.40 cm MITRAL VALVE                TRICUSPID VALVE MV Area (PHT): 2.39 cm     TR Peak grad:   66.3  mmHg MV Peak grad:  17.5 mmHg    TR Vmax:        407.00 cm/s MV Mean grad:  9.0 mmHg MV Vmax:       2.09 m/s     SHUNTS MV Vmean:      144.0 cm/s   Systemic VTI: 0.35 m MV Decel Time: 318 msec MR Peak grad: 132.7 mmHg MR Vmax:      576.00 cm/s MV E velocity: 162.00 cm/s MV A velocity: 156.00 cm/s MV E/A ratio:  1.04 Landscape architect signed by Phineas Inches Signature Date/Time: 06/19/2021/3:36:55 PM    Final         Scheduled Meds:  albuterol  2 puff Inhalation BID   allopurinol  100 mg Oral Daily   aspirin EC  81 mg Oral Daily   atorvastatin  80 mg Oral Daily   chlorhexidine  15 mL Mouth Rinse BID   Chlorhexidine Gluconate Cloth  6 each Topical Daily   cholecalciferol  1,000 Units Oral Daily   DULoxetine  60 mg Oral Daily   enoxaparin (LOVENOX) injection  40 mg Subcutaneous Q24H   furosemide  60 mg Intravenous BID   gabapentin  200 mg Oral BID   insulin aspart  0-15 Units Subcutaneous TID WC   insulin aspart  0-5 Units Subcutaneous QHS   loratadine  10 mg Oral Daily   mouth rinse  15 mL Mouth Rinse q12n4p   metoprolol tartrate  75 mg Oral BID   multivitamin with minerals  1 tablet Oral Daily   pantoprazole   40 mg Oral Daily   Continuous Infusions:  magnesium sulfate bolus IVPB 4 g (06/21/21 0836)     LOS: 2 days    Time spent: 40 minutes    Irine Seal, MD [facility]   To contact the attending provider between 7A-7P or the covering provider during after hours 7P-7A, please log into the web site [email or web address] and access using universal [place] password for that web site. If you do not have the password, please call the hospital operator.  06/21/2021, 9:28 AM

## 2021-06-21 NOTE — Evaluation (Signed)
Physical Therapy Evaluation Patient Details Name: Alyssa Ingram MRN: [id number] DOB: [birth year] Today's Date: 06/21/2021  History of Present Illness  Patient is a 76 year old female admitted with acute hypoxic respiratory failure and acute on chronic diastolic HF. PMH: HTN, HOCM, HFpEF, HLD, DM2, GERD, obesity  Clinical Impression  On eval, pt required Min A +2 for mobility. She was able to stand for a few minutes for toileting hygiene before pivoting over to recliner using a RW. Pt presents with general weakness, decreased activity tolerance, and impaired gait and balance. Remained on [place] O2 during session. She is fearful of falling-she reports hx of falls. Will plan to follow and progress activity as tolerated.        Recommendations for follow up therapy are one component of a multi-disciplinary discharge planning process, led by the attending physician.  Recommendations may be updated based on patient status, additional functional criteria and insurance authorization.  Follow Up Recommendations Home health PT    Assistance Recommended at Discharge Frequent or constant Supervision/Assistance  Patient can return home with the following  Help with stairs or ramp for entrance;Assist for transportation;Assistance with cooking/housework;A little help with walking and/or transfers    Equipment Recommendations None recommended by PT  Recommendations for Other Services       Functional Status Assessment Patient has had a recent decline in their functional status and demonstrates the ability to make significant improvements in function in a reasonable and predictable amount of time.     Precautions / Restrictions Precautions Precautions: Fall Precaution Comments: bad L knee Restrictions Weight Bearing Restrictions: No      Mobility  Bed Mobility Overal bed mobility: Needs Assistance Bed Mobility: Supine to Sit     Supine to sit: Min assist, HOB elevated     General bed mobility  comments: Patient easily distracted and tangential needing cues to redirect. Min A to scoot hip forward. cues for technique. pt used bedail to assist. increased time    Transfers Overall transfer level: Needs assistance Equipment used: Rolling walker (2 wheels) Transfers: Sit to/from Stand, Bed to chair/wheelchair/BSC Sit to Stand: Min assist, +2 physical assistance, +2 safety/equipment, From elevated surface   Step pivot transfers: Min assist, +2 physical assistance, +2 safety/equipment       General transfer comment: Assist to power up, steady, control descent. Cues for safety, technique.    Ambulation/Gait               General Gait Details: NT on today-fatigued after standing for hygiene 2* bowel incontinence  Stairs            Wheelchair Mobility    Modified Rankin (Stroke Patients Only)       Balance Overall balance assessment: Needs assistance, History of Falls         Standing balance support: Bilateral upper extremity supported Standing balance-Leahy Scale: Poor                               Pertinent Vitals/Pain Pain Assessment Pain Assessment: Faces Faces Pain Scale: No hurt    Home Living Family/patient expects to be discharged to:: Private residence Living Arrangements: Children Available Help at Discharge: Family;Available 24 hours/day Type of Home: House Home Access: Stairs to enter   [employer] of Steps: 4-5?   Home Layout: One level Home Equipment: Rollator (4 wheels);Shower seat Additional Comments: Will need to confirm, info conflicting from previous encounters. Per previous  encounter no steps to enter townhouse    Prior Function Prior Level of Function : Patient poor historian/Family not available;History of Falls (last six months)             Mobility Comments: Patient reports ambulatory with rollator ADLs Comments: Patient states has help as needed from son/grandson for ADLs such as donning  socks     Hand Dominance   Dominant Hand: Right    Extremity/Trunk Assessment   Upper Extremity Assessment Upper Extremity Assessment: Defer to OT evaluation    Lower Extremity Assessment Lower Extremity Assessment: Generalized weakness    Cervical / Trunk Assessment Cervical / Trunk Assessment: Normal  Communication   Communication: No difficulties  Cognition Arousal/Alertness: Awake/alert Behavior During Therapy: WFL for tasks assessed/performed Overall Cognitive Status: No family/caregiver present to determine baseline cognitive functioning                                 General Comments: Patient is A/Ox3 however tangential, providing different history for home set up compared to previous encounter. Needing some cues to redirect to tasks        General Comments      Exercises     Assessment/Plan    PT Assessment Patient needs continued PT services  PT Problem List Decreased strength;Decreased mobility;Decreased activity tolerance;Decreased balance;Decreased knowledge of use of DME;Decreased cognition       PT Treatment Interventions DME instruction;Gait training;Therapeutic activities;Therapeutic exercise;Patient/family education;Balance training;Functional mobility training    PT Goals (Current goals can be found in the Care Plan section)  Acute Rehab PT Goals Patient Stated Goal: to get better PT Goal Formulation: With patient Time For Goal Achievement: 07/05/21 Potential to Achieve Goals: Good    Frequency Min 3X/week     Co-evaluation   Reason for Co-Treatment: For patient/therapist safety;To address functional/ADL transfers PT goals addressed during session: Mobility/safety with mobility OT goals addressed during session: ADL's and self-care       AM-PAC PT "6 Clicks" Mobility  Outcome Measure Help needed turning from your back to your side while in a flat bed without using bedrails?: A Little Help needed moving from lying on  your back to sitting on the side of a flat bed without using bedrails?: A Little Help needed moving to and from a bed to a chair (including a wheelchair)?: A Lot Help needed standing up from a chair using your arms (e.g., wheelchair or bedside chair)?: A Lot Help needed to walk in hospital room?: A Lot Help needed climbing 3-5 steps with a railing? : A Lot 6 Click Score: 14    End of Session Equipment Utilized During Treatment: Gait belt Activity Tolerance: Patient limited by fatigue;Patient tolerated treatment well Patient left: in chair;with call bell/phone within reach   PT Visit Diagnosis: Muscle weakness (generalized) (M62.81);Difficulty in walking, not elsewhere classified (R26.2)    Time: 4270-6237 PT Time Calculation (min) (ACUTE ONLY): 17 min   Charges:   PT Evaluation $PT Eval Moderate Complexity: [place], PT Acute Rehabilitation  Office: [phone] Pager: [phone]

## 2021-06-21 NOTE — Assessment & Plan Note (Addendum)
-   Patient with chronic venous stasis changes noted. -Left lower extremity presented with some erythema, slight warmth, nontender to palpation. -cont on keflex, to complete on 2/14

## 2021-06-21 NOTE — Progress Notes (Incomplete)
Progress Note  Patient Name: Alyssa Ingram Date of Encounter: 06/21/2021  [facility] Cardiologist: Freada Bergeron, MD ***  Subjective   ***  Inpatient Medications    Scheduled Meds:  albuterol  2 puff Inhalation BID   allopurinol  100 mg Oral Daily   aspirin EC  81 mg Oral Daily   atorvastatin  80 mg Oral Daily   chlorhexidine  15 mL Mouth Rinse BID   Chlorhexidine Gluconate Cloth  6 each Topical Daily   cholecalciferol  1,000 Units Oral Daily   DULoxetine  60 mg Oral Daily   enoxaparin (LOVENOX) injection  40 mg Subcutaneous Q24H   furosemide  60 mg Intravenous BID   gabapentin  200 mg Oral BID   insulin aspart  0-15 Units Subcutaneous TID WC   insulin aspart  0-5 Units Subcutaneous QHS   loratadine  10 mg Oral Daily   mouth rinse  15 mL Mouth Rinse q12n4p   metoprolol tartrate  75 mg Oral BID   multivitamin with minerals  1 tablet Oral Daily   pantoprazole  40 mg Oral Daily   Continuous Infusions:  magnesium sulfate bolus IVPB     PRN Meds: acetaminophen **OR** acetaminophen, albuterol, meclizine, ondansetron **OR** ondansetron (ZOFRAN) IV   Vital Signs    Vitals:   06/21/21 0040 06/21/21 0400 06/21/21 0429 06/21/21 0500  BP:  (!) 145/77    Pulse:      Resp:  (!) 21    Temp: 97.9 F (36.6 C)  98.2 F (36.8 C)   TempSrc: Oral  Oral   SpO2:      Weight:    83.7 kg  Height:        Intake/Output Summary (Last 24 hours) at 06/21/2021 0804 Last data filed at 06/21/2021 0444 Gross per 24 hour  Intake 720 ml  Output 1750 ml  Net -1030 ml   Last 3 Weights 06/21/2021 06/20/2021 06/19/2021  Weight (lbs) 184 lb 8.4 oz 184 lb 8.4 oz 186 lb 4.6 oz  Weight (kg) 83.7 kg 83.7 kg 84.5 kg      Telemetry    *** - Personally Reviewed  ECG    *** - Personally Reviewed  Physical Exam  *** GEN: No acute distress.   Neck: No JVD Cardiac: RRR, no murmurs, rubs, or gallops.  Respiratory: Clear to auscultation bilaterally. GI: Soft, nontender, non-distended   MS: No edema; No deformity. Neuro:  Nonfocal  Psych: Normal affect   Labs    High Sensitivity Troponin:   Recent Labs  Lab 06/19/21 0759  TROPONINIHS 16     Chemistry Recent Labs  Lab 06/19/21 0759 06/19/21 1224 06/20/21 0249 06/21/21 0255  NA 138  --  140 140  K 3.3*  --  4.1 4.6  CL 105  --  106 103  CO2 25  --  26 29  GLUCOSE 206*  --  149* 102*  BUN 17  --  18 24*  CREATININE 0.91  --  0.85 1.00  CALCIUM 8.3*  --  8.5* 8.9  MG  --  2.0  --  1.7  PROT 6.6  --  6.8  --   ALBUMIN 3.1*  --  3.1*  --   AST 19  --  14*  --   ALT 12  --  12  --   ALKPHOS 88  --  80  --   BILITOT 0.5  --  0.6  --   GFRNONAA >60  --  >  60 59*  ANIONGAP 8  --  8 8    Lipids No results for input(s): CHOL, TRIG, HDL, LABVLDL, LDLCALC, CHOLHDL in the last 168 hours.  Hematology Recent Labs  Lab 06/19/21 0759 06/20/21 0249 06/21/21 0255  WBC 13.4* 11.6* 12.2*  RBC 3.73* 3.58* 3.66*  HGB 10.8* 10.3* 10.6*  HCT 34.2* 31.5* 32.6*  MCV 91.7 88.0 89.1  MCH 29.0 28.8 29.0  MCHC 31.6 32.7 32.5  RDW 15.2 14.7 14.8  PLT 254 249 278   Thyroid No results for input(s): TSH, FREET4 in the last 168 hours.  BNP Recent Labs  Lab 06/19/21 0759  BNP 896.0*    DDimer No results for input(s): DDIMER in the last 168 hours.   Radiology    DG Chest Portable 1 View  Result Date: 06/19/2021 CLINICAL DATA:  76 year old female with shortness of breath since 0400 hours. Wheezing and abnormal pulmonary auscultation. EXAM: PORTABLE CHEST 1 VIEW COMPARISON:  Portable chest 07/21/2020 and earlier. FINDINGS: Portable AP semi upright view at 0823 hours. Rotated to the left. Similar lung volumes. Mild cardiomegaly suspected. Other mediastinal contours are within normal limits. Visualized tracheal air column is within normal limits. Streaky and indistinct opacity at both lung bases. But upper lung pulmonary vascularity seems to remain normal. No pneumothorax. No air bronchograms or definite effusion. Negative  visible bowel gas. Chronic degenerative changes at the right shoulder. No acute osseous abnormality identified. IMPRESSION: Acute nonspecific bibasilar opacity. Top differential considerations include basilar predominant pulmonary edema, infection, less likely atelectasis. No definite effusion. Electronically Signed   By: Genevie Ann M.D.   On: 06/19/2021 08:44   DG Shoulder Right Portable  Result Date: 06/19/2021 CLINICAL DATA:  Recent falls.  Right superior shoulder pain. EXAM: RIGHT SHOULDER - 1 VIEW COMPARISON:  Chest radiograph-earlier same day FINDINGS: Examination is degraded due to patient body habitus. No definite fracture or dislocation. Severe degenerative change of the right glenohumeral joint with joint space loss, subchondral sclerosis and osteophytosis. Moderate degenerative change of the right AC joint with joint space loss, subchondral sclerosis and inferiorly directed osteophytosis. Erosions are noted about the rotator cuff insertion site. No evidence of calcific tendinitis. Limited visualization of the adjacent thorax demonstrates enlarged cardiac silhouette with indistinct pulmonary vasculature, potentially accentuated due to obliquity and technique. IMPRESSION: 1. No acute findings. 2. 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 Clinical correlation is advised. 3. Moderate degenerative change of the right AC joint. 4. Suspected cardiomegaly and pulmonary edema, incompletely evaluated. Electronically Signed   By: Sandi Mariscal M.D.   On: 06/19/2021 09:43   ECHOCARDIOGRAM COMPLETE  Result Date: 06/19/2021    ECHOCARDIOGRAM REPORT   Patient Name:   Alyssa Ingram Date of Exam: 06/19/2021 Medical Rec #:  [id number]        Height:       60.0 in Accession #:    [id number]       Weight:       186.3 lb Date of Birth:  10/31/1945        BSA:          1.811 m  Patient Age:    76 years         BP:           166/90 mmHg Patient Gender: F  HR:           85 bpm. Exam Location:  Inpatient Procedure: 2D Echo Indications:    CHF  History:        Patient has prior history of Echocardiogram examinations, most                 recent 07/24/2020. CHF; Risk Factors:Diabetes and Hypertension.  Sonographer:    Jefferey Pica Referring Phys: [id number] [place]  1. IVS thickneess ~24 mm at the basal septum. LVOT gradients not fully assessed. HOCM. Left ventricular ejection fraction, by estimation, is 70 to 75%. The left ventricle has hyperdynamic function. The left ventricle has no regional wall motion abnormalities. There is severe asymmetric left ventricular hypertrophy of the basal-septal segment. Left ventricular diastolic parameters are indeterminate.  2. Right ventricular systolic function is normal. The right ventricular size is normal. There is severely elevated pulmonary artery systolic pressure.  3. Left atrial size was severely dilated.  4. Right atrial size was severely dilated.  5. MV mean gradient 9.0 mmhg HR 85 bpm. Calcified valve. MVA 2.3 cm2. The mitral valve is degenerative. Mild mitral valve regurgitation. Mild to moderate mitral stenosis.  6. Aortic valve regurgitation is not visualized. Aortic valve sclerosis/calcification is present, without any evidence of aortic stenosis.  7. The inferior vena cava is dilated in size with >50% respiratory variability, suggesting right atrial pressure of 8 mmHg. Comparison(s): No significant change from prior study. FINDINGS  Left Ventricle: IVS thickneess ~24 mm at the basal septum. LVOT gradients not fully assessed. HOCM. Left ventricular ejection fraction, by estimation, is 70 to 75%. The left ventricle has hyperdynamic function. The left ventricle has no regional wall motion abnormalities. The left ventricular internal cavity size was small. There is severe asymmetric left ventricular hypertrophy  of the basal-septal segment. Left ventricular diastolic parameters are indeterminate. Right Ventricle: The right ventricular size is normal. No increase in right ventricular wall thickness. Right ventricular systolic function is normal. There is severely elevated pulmonary artery systolic pressure. The tricuspid regurgitant velocity is 4.07 m/s, and with an assumed right atrial pressure of 8 mmHg, the estimated right ventricular systolic pressure is 41.7 mmHg. Left Atrium: Left atrial size was severely dilated. Right Atrium: Right atrial size was severely dilated. Pericardium: There is no evidence of pericardial effusion. Mitral Valve: MV mean gradient 9.0 mmhg HR 85 bpm. Calcified valve. MVA 2.3 cm2. The mitral valve is degenerative in appearance. There is severe calcification of the mitral valve leaflet(s). Mild mitral valve regurgitation. Mild to moderate mitral valve stenosis. MV peak gradient, 17.5 mmHg. The mean mitral valve gradient is 9.0 mmHg. Tricuspid Valve: The tricuspid valve is grossly normal. Tricuspid valve regurgitation is mild. Aortic Valve: Aortic valve regurgitation is not visualized. Aortic valve sclerosis/calcification is present, without any evidence of aortic stenosis. Aortic valve mean gradient measures 9.5 mmHg. Aortic valve peak gradient measures 21.4 mmHg. Pulmonic Valve: The pulmonic valve was grossly normal. Pulmonic valve regurgitation is not visualized. Aorta: The aortic root and ascending aorta are structurally normal, with no evidence of dilitation. Venous: The inferior vena cava is dilated in size with greater than 50% respiratory variability, suggesting right atrial pressure of 8 mmHg. IAS/Shunts: No atrial level shunt detected by color flow Doppler.  LEFT VENTRICLE PLAX 2D LVIDd:         4.30 cm Diastology LVIDs:         1.30 cm LV e' medial:    3.25 cm/s LV PW:  1.50 cm LV E/e' medial:  49.8 LV IVS:        2.30 cm LV e' lateral:   3.62 cm/s                        LV E/e'  lateral: 44.8  RIGHT VENTRICLE             IVC RV Basal diam:  3.10 cm     IVC diam: 2.20 cm RV Mid diam:    2.70 cm RV S prime:     17.60 cm/s TAPSE (M-mode): 2.3 cm LEFT ATRIUM              Index        RIGHT ATRIUM           Index LA diam:        5.10 cm  2.82 cm/m   RA Area:     16.80 cm LA Vol (A2C):   131.0 ml 72.33 ml/m  RA Volume:   49.70 ml  27.44 ml/m LA Vol (A4C):   113.0 ml 62.39 ml/m LA Biplane Vol: 123.0 ml 67.91 ml/m  AORTIC VALVE                    PULMONIC VALVE AV Vmax:           231.50 cm/s  PV Vmax:       1.04 m/s AV Vmean:          138.500 cm/s PV Peak grad:  4.3 mmHg AV VTI:            0.392 m AV Peak Grad:      21.4 mmHg AV Mean Grad:      9.5 mmHg LVOT Vmax:         197.00 cm/s LVOT Vmean:        114.000 cm/s LVOT VTI:          0.350 m LVOT/AV VTI ratio: 0.89  AORTA Ao Root diam: 3.30 cm Ao Asc diam:  3.40 cm MITRAL VALVE                TRICUSPID VALVE MV Area (PHT): 2.39 cm     TR Peak grad:   66.3 mmHg MV Peak grad:  17.5 mmHg    TR Vmax:        407.00 cm/s MV Mean grad:  9.0 mmHg MV Vmax:       2.09 m/s     SHUNTS MV Vmean:      144.0 cm/s   Systemic VTI: 0.35 m MV Decel Time: 318 msec MR Peak grad: 132.7 mmHg MR Vmax:      576.00 cm/s MV E velocity: 162.00 cm/s MV A velocity: 156.00 cm/s MV E/A ratio:  1.04 Landscape architect signed by Phineas Inches Signature Date/Time: 06/19/2021/3:36:55 PM    Final     Cardiac Studies     Patient Profile     76 y.o. female with a hx of HOCM, HTN, DM2, HLD, GERD, HFpEF, and anemia who is being seen 06/20/2021 for the evaluation of shortness of breath  Assessment & Plan    Acute on chronic diastolic heart failure HOCM - echo 06/19/21 with        {Are we signing off today?:[id number]}  For questions or updates, please contact [place] Please consult [email or web address] for contact info under        Signed, Ledora Bottcher, PA  06/21/2021, 8:04 AM

## 2021-06-21 NOTE — Progress Notes (Signed)
Progress Note  Patient Name: Alyssa Ingram Date of Encounter: 06/22/2021  [facility] Cardiologist: Freada Bergeron, MD   Subjective   Feeling much better today. Breathing improving.  HR 50s. Cr remains stable 0.95 Net negative 3200; wt likely inaccurate as suspect doing bed weights  Inpatient Medications    Scheduled Meds:  albuterol  2 puff Inhalation BID   allopurinol  100 mg Oral Daily   aspirin EC  81 mg Oral Daily   atorvastatin  80 mg Oral Daily   cephALEXin  500 mg Oral Q6H   chlorhexidine  15 mL Mouth Rinse BID   Chlorhexidine Gluconate Cloth  6 each Topical Daily   cholecalciferol  1,000 Units Oral Daily   diltiazem  120 mg Oral Daily   DULoxetine  60 mg Oral Daily   enoxaparin (LOVENOX) injection  40 mg Subcutaneous Q24H   furosemide  60 mg Intravenous BID   gabapentin  200 mg Oral BID   insulin aspart  0-15 Units Subcutaneous TID WC   insulin aspart  0-5 Units Subcutaneous QHS   loratadine  10 mg Oral Daily   mouth rinse  15 mL Mouth Rinse q12n4p   metoprolol tartrate  75 mg Oral BID   multivitamin with minerals  1 tablet Oral Daily   pantoprazole  40 mg Oral Daily   Continuous Infusions:   PRN Meds: acetaminophen **OR** acetaminophen, albuterol, lip balm, meclizine, ondansetron **OR** ondansetron (ZOFRAN) IV   Vital Signs    Vitals:   06/22/21 0411 06/22/21 0800 06/22/21 0832 06/22/21 0950  BP:  (!) 142/77    Pulse:  (!) 54 (!) 54 (!) 53  Resp:  19 18 15   Temp:  97.8 F (36.6 C)    TempSrc:      SpO2:  100% 95% 99%  Weight: 83.5 kg     Height:        Intake/Output Summary (Last 24 hours) at 06/22/2021 1052 Last data filed at 06/22/2021 0951 Gross per 24 hour  Intake 340 ml  Output 4050 ml  Net -3710 ml   Last 3 Weights 06/22/2021 06/21/2021 06/20/2021  Weight (lbs) 184 lb 1.4 oz 184 lb 8.4 oz 184 lb 8.4 oz  Weight (kg) 83.5 kg 83.7 kg 83.7 kg      Telemetry    Sinus bradycardia, occasional PVCs - Personally Reviewed  ECG     No new tracing today- Personally Reviewed  Physical Exam   GEN: No acute distress.   Neck: JVD mildly elevated Cardiac: RRR, 2/6 systolic murmur Respiratory: Crackles at the bases GI: Soft, nontender, non-distended  MS: 1+ pitting edema with chronic venous stasis changes Neuro:  Nonfocal  Psych: Normal affect   Labs    High Sensitivity Troponin:   Recent Labs  Lab 06/19/21 0759  TROPONINIHS 16     Chemistry Recent Labs  Lab 06/19/21 0759 06/19/21 1224 06/20/21 0249 06/21/21 0255 06/22/21 0240  NA 138  --  140 140 137  K 3.3*  --  4.1 4.6 4.2  CL 105  --  106 103 98  CO2 25  --  26 29 30   GLUCOSE 206*  --  149* 102* 96  BUN 17  --  18 24* 28*  CREATININE 0.91  --  0.85 1.00 0.95  CALCIUM 8.3*  --  8.5* 8.9 8.6*  MG  --  2.0  --  1.7 2.3  PROT 6.6  --  6.8  --   --  ALBUMIN 3.1*  --  3.1*  --   --   AST 19  --  14*  --   --   ALT 12  --  12  --   --   ALKPHOS 88  --  80  --   --   BILITOT 0.5  --  0.6  --   --   GFRNONAA >60  --  >60 59* >60  ANIONGAP 8  --  8 8 9     Lipids No results for input(s): CHOL, TRIG, HDL, LABVLDL, LDLCALC, CHOLHDL in the last 168 hours.  Hematology Recent Labs  Lab 06/20/21 0249 06/21/21 0255 06/22/21 0240  WBC 11.6* 12.2* 8.2  RBC 3.58* 3.66* 3.72*  HGB 10.3* 10.6* 10.7*  HCT 31.5* 32.6* 33.0*  MCV 88.0 89.1 88.7  MCH 28.8 29.0 28.8  MCHC 32.7 32.5 32.4  RDW 14.7 14.8 14.9  PLT 249 278 259   Thyroid No results for input(s): TSH, FREET4 in the last 168 hours.  BNP Recent Labs  Lab 06/19/21 0759  BNP 896.0*    DDimer No results for input(s): DDIMER in the last 168 hours.   Radiology    No results found.  Cardiac Studies   Echo 06/19/21:  1. IVS thickneess ~24 mm at the basal septum. LVOT gradients not fully  assessed. HOCM. Left ventricular ejection fraction, by estimation, is 70  to 75%. The left ventricle has hyperdynamic function. The left ventricle  has no regional wall motion  abnormalities. There is  severe asymmetric left ventricular hypertrophy of  the basal-septal segment. Left ventricular diastolic parameters are  indeterminate.   2. Right ventricular systolic function is normal. The right ventricular  size is normal. There is severely elevated pulmonary artery systolic  pressure.   3. Left atrial size was severely dilated.   4. Right atrial size was severely dilated.   5. MV mean gradient 9.0 mmhg HR 85 bpm. Calcified valve. MVA 2.3 cm2. The  mitral valve is degenerative. Mild mitral valve regurgitation. Mild to  moderate mitral stenosis.   6. Aortic valve regurgitation is not visualized. Aortic valve  sclerosis/calcification is present, without any evidence of aortic  stenosis.   7. The inferior vena cava is dilated in size with >50% respiratory  variability, suggesting right atrial pressure of 8 mmHg.   Patient Profile     76 y.o. female with a hx of HOCM, HTN, DM2, HLD, GERD, HFpEF, and anemia who presented with worsening SOB and LE edema concerning for acute on chronic diastolic HF exacerbation for which Cardiology has been consulted.  Assessment & Plan    #Acute on chronic diastolic heart failure #HOCM #Acute respiratory failure Patient with known history of HOCM but has not seen cardiology since 2018. On admission, was hypoxic with evidence of volume overload initially requiring BiPAP. Received IV lasix with good response and has been weaned to [place]. TTE 02/05 with LVEF 70-75%, severe hypertrophy of the basal septum, peak gradient through AoV 105mmHg, peak LVOT gradient 17, severe PHTN (PASP 27mmHg) mild-to-mod MS.  - Continue lasix 60mg  IV BID - Change metop to coreg 12.5mg  BID for better blood pressure lowering - Continue Dilt 120mg  ER daily - Monitor I/Os and daily weights - Low Na diet - Will need continued follow-up as out-patient   #Hypertension Elevated this AM - Change metop to coreg 12.5mg  BID for better blood pressure lowering - Continue dilt 120mg  daily  -  Okay to hold nodal agents for HR<55; if needing  to hold frequently, will stop dilt   #Hyperlipidemia 01/18/2021: Cholesterol 191; HDL 57.00; LDL Cholesterol 115; Triglycerides 98.0; VLDL 19.6 - Continue lipitor 80mg  daily   #Lower extremity edema #Possible cellulitis Chronic skin changes and appearance felt more consistent with lymphedema Legs are tender and warm - possible component of superimposed cellulitis. Now started on ABX -Continue keflex -Diuresis as above     For questions or updates, please contact [place] Please consult [email or web address] for contact info under        Signed, Freada Bergeron, MD  06/22/2021, 10:52 AM

## 2021-06-21 NOTE — Progress Notes (Signed)
Progress Note  Patient Name: Alyssa Ingram Date of Encounter: 06/21/2021  [facility] Cardiologist: Freada Bergeron, MD   Subjective   Sleeping comfortably in the chair. States breathing is improved.  Wt stable at 184lbs; reports good UOP to lasix  Inpatient Medications    Scheduled Meds:  albuterol  2 puff Inhalation BID   allopurinol  100 mg Oral Daily   aspirin EC  81 mg Oral Daily   atorvastatin  80 mg Oral Daily   cephALEXin  500 mg Oral Q6H   chlorhexidine  15 mL Mouth Rinse BID   Chlorhexidine Gluconate Cloth  6 each Topical Daily   cholecalciferol  1,000 Units Oral Daily   DULoxetine  60 mg Oral Daily   enoxaparin (LOVENOX) injection  40 mg Subcutaneous Q24H   furosemide  60 mg Intravenous BID   gabapentin  200 mg Oral BID   insulin aspart  0-15 Units Subcutaneous TID WC   insulin aspart  0-5 Units Subcutaneous QHS   loratadine  10 mg Oral Daily   mouth rinse  15 mL Mouth Rinse q12n4p   metoprolol tartrate  75 mg Oral BID   multivitamin with minerals  1 tablet Oral Daily   pantoprazole  40 mg Oral Daily   Continuous Infusions:  magnesium sulfate bolus IVPB 4 g (06/21/21 0836)   PRN Meds: acetaminophen **OR** acetaminophen, albuterol, meclizine, ondansetron **OR** ondansetron (ZOFRAN) IV   Vital Signs    Vitals:   06/21/21 0500 06/21/21 0700 06/21/21 0800 06/21/21 0900  BP:   (!) 142/75   Pulse:  69 65 73  Resp:  19 (!) 35 (!) 26  Temp:   98.7 F (37.1 C)   TempSrc:   Oral   SpO2:  98% 99% 97%  Weight: 83.7 kg     Height:        Intake/Output Summary (Last 24 hours) at 06/21/2021 0953 Last data filed at 06/21/2021 0444 Gross per 24 hour  Intake 720 ml  Output 1750 ml  Net -1030 ml   Last 3 Weights 06/21/2021 06/20/2021 06/19/2021  Weight (lbs) 184 lb 8.4 oz 184 lb 8.4 oz 186 lb 4.6 oz  Weight (kg) 83.7 kg 83.7 kg 84.5 kg      Telemetry    NSR HR mainly in the 60s; occasional PVCs - Personally Reviewed  ECG    No new tracing -  Personally Reviewed  Physical Exam   GEN: No acute distress.   Neck: JVD mildly elevated Cardiac: RRR, 2/6 systolic murmur Respiratory: Crackles at the bases GI: Soft, nontender, non-distended  MS: 1+ pitting edema with chronic venous stasis changes Neuro:  Nonfocal  Psych: Normal affect   Labs    High Sensitivity Troponin:   Recent Labs  Lab 06/19/21 0759  TROPONINIHS 16     Chemistry Recent Labs  Lab 06/19/21 0759 06/19/21 1224 06/20/21 0249 06/21/21 0255  NA 138  --  140 140  K 3.3*  --  4.1 4.6  CL 105  --  106 103  CO2 25  --  26 29  GLUCOSE 206*  --  149* 102*  BUN 17  --  18 24*  CREATININE 0.91  --  0.85 1.00  CALCIUM 8.3*  --  8.5* 8.9  MG  --  2.0  --  1.7  PROT 6.6  --  6.8  --   ALBUMIN 3.1*  --  3.1*  --   AST 19  --  14*  --  ALT 12  --  12  --   ALKPHOS 88  --  80  --   BILITOT 0.5  --  0.6  --   GFRNONAA >60  --  >60 59*  ANIONGAP 8  --  8 8    Lipids No results for input(s): CHOL, TRIG, HDL, LABVLDL, LDLCALC, CHOLHDL in the last 168 hours.  Hematology Recent Labs  Lab 06/19/21 0759 06/20/21 0249 06/21/21 0255  WBC 13.4* 11.6* 12.2*  RBC 3.73* 3.58* 3.66*  HGB 10.8* 10.3* 10.6*  HCT 34.2* 31.5* 32.6*  MCV 91.7 88.0 89.1  MCH 29.0 28.8 29.0  MCHC 31.6 32.7 32.5  RDW 15.2 14.7 14.8  PLT 254 249 278   Thyroid No results for input(s): TSH, FREET4 in the last 168 hours.  BNP Recent Labs  Lab 06/19/21 0759  BNP 896.0*    DDimer No results for input(s): DDIMER in the last 168 hours.   Radiology    ECHOCARDIOGRAM COMPLETE  Result Date: 06/19/2021    ECHOCARDIOGRAM REPORT   Patient Name:   Alyssa Ingram Date of Exam: 06/19/2021 Medical Rec #:  [id number]        Height:       60.0 in Accession #:    [id number]       Weight:       186.3 lb Date of Birth:  [birth year]        BSA:          1.811 m Patient Age:    76 years         BP:           166/90 mmHg Patient Gender: F                HR:           85 bpm. Exam Location:  Inpatient  Procedure: 2D Echo Indications:    CHF  History:        Patient has prior history of Echocardiogram examinations, most                 recent 07/24/2020. CHF; Risk Factors:Diabetes and Hypertension.  Sonographer:    Jefferey Pica Referring Phys: [id number] [place]  1. IVS thickneess ~24 mm at the basal septum. LVOT gradients not fully assessed. HOCM. Left ventricular ejection fraction, by estimation, is 70 to 75%. The left ventricle has hyperdynamic function. The left ventricle has no regional wall motion abnormalities. There is severe asymmetric left ventricular hypertrophy of the basal-septal segment. Left ventricular diastolic parameters are indeterminate.  2. Right ventricular systolic function is normal. The right ventricular size is normal. There is severely elevated pulmonary artery systolic pressure.  3. Left atrial size was severely dilated.  4. Right atrial size was severely dilated.  5. MV mean gradient 9.0 mmhg HR 85 bpm. Calcified valve. MVA 2.3 cm2. The mitral valve is degenerative. Mild mitral valve regurgitation. Mild to moderate mitral stenosis.  6. Aortic valve regurgitation is not visualized. Aortic valve sclerosis/calcification is present, without any evidence of aortic stenosis.  7. The inferior vena cava is dilated in size with >50% respiratory variability, suggesting right atrial pressure of 8 mmHg. Comparison(s): No significant change from prior study. FINDINGS  Left Ventricle: IVS thickneess ~24 mm at the basal septum. LVOT gradients not fully assessed. HOCM. Left ventricular ejection fraction, by estimation, is 70 to 75%. The left ventricle has hyperdynamic function. The left ventricle has no regional wall motion abnormalities. The left ventricular  internal cavity size was small. There is severe asymmetric left ventricular hypertrophy of the basal-septal segment. Left ventricular diastolic parameters are indeterminate. Right Ventricle: The right ventricular size is normal.  No increase in right ventricular wall thickness. Right ventricular systolic function is normal. There is severely elevated pulmonary artery systolic pressure. The tricuspid regurgitant velocity is 4.07 m/s, and with an assumed right atrial pressure of 8 mmHg, the estimated right ventricular systolic pressure is 45.6 mmHg. Left Atrium: Left atrial size was severely dilated. Right Atrium: Right atrial size was severely dilated. Pericardium: There is no evidence of pericardial effusion. Mitral Valve: MV mean gradient 9.0 mmhg HR 85 bpm. Calcified valve. MVA 2.3 cm2. The mitral valve is degenerative in appearance. There is severe calcification of the mitral valve leaflet(s). Mild mitral valve regurgitation. Mild to moderate mitral valve stenosis. MV peak gradient, 17.5 mmHg. The mean mitral valve gradient is 9.0 mmHg. Tricuspid Valve: The tricuspid valve is grossly normal. Tricuspid valve regurgitation is mild. Aortic Valve: Aortic valve regurgitation is not visualized. Aortic valve sclerosis/calcification is present, without any evidence of aortic stenosis. Aortic valve mean gradient measures 9.5 mmHg. Aortic valve peak gradient measures 21.4 mmHg. Pulmonic Valve: The pulmonic valve was grossly normal. Pulmonic valve regurgitation is not visualized. Aorta: The aortic root and ascending aorta are structurally normal, with no evidence of dilitation. Venous: The inferior vena cava is dilated in size with greater than 50% respiratory variability, suggesting right atrial pressure of 8 mmHg. IAS/Shunts: No atrial level shunt detected by color flow Doppler.  LEFT VENTRICLE PLAX 2D LVIDd:         4.30 cm Diastology LVIDs:         1.30 cm LV e' medial:    3.25 cm/s LV PW:         1.50 cm LV E/e' medial:  49.8 LV IVS:        2.30 cm LV e' lateral:   3.62 cm/s                        LV E/e' lateral: 44.8  RIGHT VENTRICLE             IVC RV Basal diam:  3.10 cm     IVC diam: 2.20 cm RV Mid diam:    2.70 cm RV S prime:     17.60  cm/s TAPSE (M-mode): 2.3 cm LEFT ATRIUM              Index        RIGHT ATRIUM           Index LA diam:        5.10 cm  2.82 cm/m   RA Area:     16.80 cm LA Vol (A2C):   131.0 ml 72.33 ml/m  RA Volume:   49.70 ml  27.44 ml/m LA Vol (A4C):   113.0 ml 62.39 ml/m LA Biplane Vol: 123.0 ml 67.91 ml/m  AORTIC VALVE                    PULMONIC VALVE AV Vmax:           231.50 cm/s  PV Vmax:       1.04 m/s AV Vmean:          138.500 cm/s PV Peak grad:  4.3 mmHg AV VTI:            0.392 m AV Peak Grad:      21.4  mmHg AV Mean Grad:      9.5 mmHg LVOT Vmax:         197.00 cm/s LVOT Vmean:        114.000 cm/s LVOT VTI:          0.350 m LVOT/AV VTI ratio: 0.89  AORTA Ao Root diam: 3.30 cm Ao Asc diam:  3.40 cm MITRAL VALVE                TRICUSPID VALVE MV Area (PHT): 2.39 cm     TR Peak grad:   66.3 mmHg MV Peak grad:  17.5 mmHg    TR Vmax:        407.00 cm/s MV Mean grad:  9.0 mmHg MV Vmax:       2.09 m/s     SHUNTS MV Vmean:      144.0 cm/s   Systemic VTI: 0.35 m MV Decel Time: 318 msec MR Peak grad: 132.7 mmHg MR Vmax:      576.00 cm/s MV E velocity: 162.00 cm/s MV A velocity: 156.00 cm/s MV E/A ratio:  1.04 Mary Scientist, physiological signed by Phineas Inches Signature Date/Time: 06/19/2021/3:36:55 PM    Final     Cardiac Studies   Echo 06/19/21:  1. IVS thickneess ~24 mm at the basal septum. LVOT gradients not fully  assessed. HOCM. Left ventricular ejection fraction, by estimation, is 70  to 75%. The left ventricle has hyperdynamic function. The left ventricle  has no regional wall motion  abnormalities. There is severe asymmetric left ventricular hypertrophy of  the basal-septal segment. Left ventricular diastolic parameters are  indeterminate.   2. Right ventricular systolic function is normal. The right ventricular  size is normal. There is severely elevated pulmonary artery systolic  pressure.   3. Left atrial size was severely dilated.   4. Right atrial size was severely dilated.   5. MV mean gradient  9.0 mmhg HR 85 bpm. Calcified valve. MVA 2.3 cm2. The  mitral valve is degenerative. Mild mitral valve regurgitation. Mild to  moderate mitral stenosis.   6. Aortic valve regurgitation is not visualized. Aortic valve  sclerosis/calcification is present, without any evidence of aortic  stenosis.   7. The inferior vena cava is dilated in size with >50% respiratory  variability, suggesting right atrial pressure of 8 mmHg.   Patient Profile     76 y.o. female with a hx of HOCM, HTN, DM2, HLD, GERD, HFpEF, and anemia who presented with worsening SOB and LE edema concerning for acute on chronic diastolic HF exacerbation for which Cardiology has been consulted.  Assessment & Plan    #Acute on chronic diastolic heart failure #HOCM #Acute respiratory failure Patient with known history of HOCM but has not seen cardiology since 2018. On admission, was hypoxic with evidence of volume overload initially requiring BiPAP. Received IV lasix with good response and has been weaned to . TTE 02/05 with LVEF 70-75%, severe hypertrophy of the basal septum, peak gradient through AoV 48mmHg, peak LVOT gradient 17, severe PHTN (PASP 63mmHg) mild-to-mod MS.  - Continue lasix 60mg  IV BID - Continue metop 75mg  BID - Will resume Dilt 120mg  ER daily - Monitor I/Os and daily weights - Low Na diet - Will need continued follow-up as out-patient   #Hypertension Elevated this AM - Continue metop 75mg  BID - Will resume dilt 120mg  daily and up-titrate as needed   #Hyperlipidemia 01/18/2021: Cholesterol 191; HDL 57.00; LDL Cholesterol 115; Triglycerides 98.0; VLDL 19.6 - Continue lipitor 80mg   daily   #Lower extremity edema #Possible cellulitis Chronic skin changes and appearance felt more consistent with lymphedema Legs are tender and warm - possible component of superimposed cellulitis. Now started on ABX -Started on keflex  -Diuresis as above      For questions or updates, please contact [place] Please consult [email or web address] for contact info under        Signed, Freada Bergeron, MD  06/21/2021, 9:53 AM

## 2021-06-21 NOTE — Progress Notes (Signed)
Patient still sitting in the chair. RN will re enter consult when patient back in bed.

## 2021-06-21 NOTE — Evaluation (Signed)
Occupational Therapy Evaluation Patient Details Name: Alyssa Ingram MRN: [id number] DOB: 06-20-1945 Today's Date: 06/21/2021   History of Present Illness Patient is a 76 year old female admitted with acute hypoxic respiratory failure and acute on chronic diastolic HF. PMH: HTN, HOCM, HFpEF, HLD, DM2, GERD.   Clinical Impression   Patient lives in [place] with her son and reports she has been using rollator for ambulation and intermittent assistance for self care such as lower body dressing.  Currently patient with decreased global strength, endurance and safety impacting overall independence with self care. Patient min +2 to take few steps to recliner and total A for pericare after incontinent of bowel in bed. Patient total A for donning socks which she states sometimes has son or grandson assist with this at home. Patient also needing mod to max cues to redirect as patient is tangential. Anticipate patient will progress well with continued mobility and D/C home with son support. Patient states son does great job caring for her. Acute OT to follow.     Recommendations for follow up therapy are one component of a multi-disciplinary discharge planning process, led by the attending physician.  Recommendations may be updated based on patient status, additional functional criteria and insurance authorization.   Follow Up Recommendations  Home health OT    Assistance Recommended at Discharge Frequent or constant Supervision/Assistance  Patient can return home with the following A little help with walking and/or transfers;A lot of help with bathing/dressing/bathroom;Direct supervision/assist for medications management;Direct supervision/assist for financial management;Assist for transportation;Help with stairs or ramp for entrance    Functional Status Assessment  Patient has had a recent decline in their functional status and demonstrates the ability to make significant improvements in function in  a reasonable and predictable amount of time.  Equipment Recommendations  None recommended by OT       Precautions / Restrictions Precautions Precautions: Fall Restrictions Weight Bearing Restrictions: No      Mobility Bed Mobility Overal bed mobility: Needs Assistance Bed Mobility: Supine to Sit     Supine to sit: Min assist, HOB elevated     General bed mobility comments: Patient easily distracted and tangential needing cues to redirect. Min A to scoot hip forward        Balance Overall balance assessment: Needs assistance Sitting-balance support: Feet supported Sitting balance-Leahy Scale: Fair     Standing balance support: Reliant on assistive device for balance Standing balance-Leahy Scale: Poor                             ADL either performed or assessed with clinical judgement   ADL Overall ADL's : Needs assistance/impaired Eating/Feeding: Independent;Bed level   Grooming: Set up;Sitting   Upper Body Bathing: Supervision/ safety;Set up;Sitting   Lower Body Bathing: Moderate assistance;Sitting/lateral leans;Sit to/from stand   Upper Body Dressing : Set up;Supervision/safety;Sitting   Lower Body Dressing: Total assistance;Sitting/lateral leans;Sit to/from stand;Bed level Lower Body Dressing Details (indicate cue type and reason): to don socks Toilet Transfer: Minimal assistance;+2 for safety/equipment;+2 for physical assistance;Stand-pivot;Cueing for safety;Cueing for sequencing;Rolling walker (2 wheels) Toilet Transfer Details (indicate cue type and reason): Min +2 to power up to standing from edge of bed and cues for hand placement. Able to take few steps to recliner chair Toileting- Clothing Manipulation and Hygiene: Total assistance;Sit to/from stand;Sitting/lateral lean Toileting - Clothing Manipulation Details (indicate cue type and reason): Patient incontinent of bowel movement upon arrival needing total A  in standing to perform pericare      Functional mobility during ADLs: Minimal assistance;+2 for physical assistance;+2 for safety/equipment;Rolling walker (2 wheels);Cueing for sequencing;Cueing for safety General ADL Comments: Patient needing increased assistance with self care tasks due to decreased safety awareness, endurance and global strength      Pertinent Vitals/Pain Pain Assessment Pain Assessment: No/denies pain     Hand Dominance Right   Extremity/Trunk Assessment Upper Extremity Assessment Upper Extremity Assessment: Generalized weakness   Lower Extremity Assessment Lower Extremity Assessment: Defer to PT evaluation       Communication Communication Communication: No difficulties   Cognition Arousal/Alertness: Awake/alert Behavior During Therapy: WFL for tasks assessed/performed Overall Cognitive Status: No family/caregiver present to determine baseline cognitive functioning                                 General Comments: Patient is A/Ox3 however tangential, providing different history for home set up compared to previous encounter. Needing max cues to redirect to tasks                Home Living Family/patient expects to be discharged to:: Private residence Living Arrangements: Children (son) Available Help at Discharge: Family;Available 24 hours/day Type of Home: Other(Comment) (townhouse) Home Access: Stairs to enter [employer] of Steps: 4-5   Home Layout: One level     Bathroom Shower/Tub: Occupational psychologist: Standard     Home Equipment: Rollator (4 wheels);Shower seat   Additional Comments: Will need to confirm, info conflicting from previous encounters. Per previous encounter no steps to enter townhouse      Prior Functioning/Environment Prior Level of Function : Patient poor historian/Family not available;History of Falls (last six months)             Mobility Comments: Patient reports ambulatory with rollator ADLs  Comments: Patient states has help as needed from son/grandson for ADLs such as donning socks        OT Problem List: Decreased strength;Decreased activity tolerance;Impaired balance (sitting and/or standing);Decreased cognition;Decreased safety awareness;Obesity      OT Treatment/Interventions: Self-care/ADL training;Therapeutic exercise;Balance training;Patient/family education;Therapeutic activities;Cognitive remediation/compensation    OT Goals(Current goals can be found in the care plan section) Acute Rehab OT Goals Patient Stated Goal: Home with son OT Goal Formulation: With patient Time For Goal Achievement: 07/05/21 Potential to Achieve Goals: Good  OT Frequency: Min 2X/week    Co-evaluation PT/OT/SLP Co-Evaluation/Treatment: Yes Reason for Co-Treatment: For patient/therapist safety;To address functional/ADL transfers PT goals addressed during session: Mobility/safety with mobility OT goals addressed during session: ADL's and self-care      AM-PAC OT "6 Clicks" Daily Activity     Outcome Measure Help from another person eating meals?: None Help from another person taking care of personal grooming?: A Little Help from another person toileting, which includes using toliet, bedpan, or urinal?: A Lot Help from another person bathing (including washing, rinsing, drying)?: A Lot Help from another person to put on and taking off regular upper body clothing?: A Little Help from another person to put on and taking off regular lower body clothing?: Total 6 Click Score: 15   End of Session Equipment Utilized During Treatment: Rolling walker (2 wheels) Nurse Communication: Mobility status  Activity Tolerance: Patient tolerated treatment well Patient left: in chair;with call bell/phone within reach;with chair alarm set  OT Visit Diagnosis: Other abnormalities of gait and mobility (R26.89);Unsteadiness on feet (R26.81)  Time: 8828-0034 OT Time Calculation (min): 29  min Charges:  OT General Charges $OT Visit: 1 Visit OT Evaluation $OT Eval Low Complexity: [place] OT OT pager: [place] 06/21/2021, 2:00 PM

## 2021-06-22 LAB — CBC WITH DIFFERENTIAL/PLATELET
Abs Immature Granulocytes: 0.05 10*3/uL (ref 0.00–0.07)
Basophils Absolute: 0 10*3/uL (ref 0.0–0.1)
Basophils Relative: 0 %
Eosinophils Absolute: 0.3 10*3/uL (ref 0.0–0.5)
Eosinophils Relative: 3 %
HCT: 33 % — ABNORMAL LOW (ref 36.0–46.0)
Hemoglobin: 10.7 g/dL — ABNORMAL LOW (ref 12.0–15.0)
Immature Granulocytes: 1 %
Lymphocytes Relative: 23 %
Lymphs Abs: 1.9 10*3/uL (ref 0.7–4.0)
MCH: 28.8 pg (ref 26.0–34.0)
MCHC: 32.4 g/dL (ref 30.0–36.0)
MCV: 88.7 fL (ref 80.0–100.0)
Monocytes Absolute: 0.6 10*3/uL (ref 0.1–1.0)
Monocytes Relative: 7 %
Neutro Abs: 5.4 10*3/uL (ref 1.7–7.7)
Neutrophils Relative %: 66 %
Platelets: 259 10*3/uL (ref 150–400)
RBC: 3.72 MIL/uL — ABNORMAL LOW (ref 3.87–5.11)
RDW: 14.9 % (ref 11.5–15.5)
WBC: 8.2 10*3/uL (ref 4.0–10.5)
nRBC: 0 % (ref 0.0–0.2)

## 2021-06-22 LAB — GLUCOSE, CAPILLARY
Glucose-Capillary: 109 mg/dL — ABNORMAL HIGH (ref 70–99)
Glucose-Capillary: 109 mg/dL — ABNORMAL HIGH (ref 70–99)
Glucose-Capillary: 125 mg/dL — ABNORMAL HIGH (ref 70–99)
Glucose-Capillary: 125 mg/dL — ABNORMAL HIGH (ref 70–99)

## 2021-06-22 LAB — BASIC METABOLIC PANEL
Anion gap: 9 (ref 5–15)
BUN: 28 mg/dL — ABNORMAL HIGH (ref 8–23)
CO2: 30 mmol/L (ref 22–32)
Calcium: 8.6 mg/dL — ABNORMAL LOW (ref 8.9–10.3)
Chloride: 98 mmol/L (ref 98–111)
Creatinine, Ser: 0.95 mg/dL (ref 0.44–1.00)
GFR, Estimated: >60 mL/min (ref >60)
Glucose, Bld: 96 mg/dL (ref 70–99)
Potassium: 4.2 mmol/L (ref 3.5–5.1)
Sodium: 137 mmol/L (ref 135–145)

## 2021-06-22 LAB — MAGNESIUM: Magnesium: 2.3 mg/dL (ref 1.7–2.4)

## 2021-06-22 MED ORDER — CARVEDILOL 12.5 MG PO TABS
12.5000 mg | ORAL_TABLET | Freq: Two times a day (BID) | ORAL | Status: DC
  Administered 2021-06-23: 12.5 mg via ORAL
  Filled 2021-06-22 (×2): qty 1

## 2021-06-22 MED ORDER — LIP MEDEX EX OINT
TOPICAL_OINTMENT | CUTANEOUS | Status: DC | PRN
  Filled 2021-06-22: qty 7

## 2021-06-22 NOTE — Progress Notes (Signed)
Progress Note   Patient: Alyssa Ingram OTL:[id number] DOB: 06-10-1945 DOA: 06/19/2021     3 DOS: the patient was seen and examined on 06/22/2021   Brief hospital course: Patient 76 year old female history of hypertension, hokum, HFpEF, hyperlipidemia, diabetes type 2, GERD presented with worsening shortness of breath which started on the morning of admission.  Patient initially felt was having indigestion and gas tried some ginger ale with no improvement dyspnea persisted and got worse so family called EMS.  Patient noted to have missed several nighttime doses of her normal diuretics with recent increase in dietary indiscretion.  Patient noted to have elevated BNP, initially required BiPAP on admission and noted to be in acute CHF exacerbation.  Patient placed on IV Lasix.  Noted to have soft/borderline blood pressure admitted to the stepdown unit.  Cardiology consulted medications adjusted.  Assessment and Plan: * Acute on chronic heart failure ([place]) - Patient presented worsening shortness of breath and noted to be in acute CHF. -Secondary to missed doses of diuretics over several days, dietary indiscretion. -Patient with history of HOCM. -BNP noted to be elevated at 896 on presentation. -Chest x-ray concerning for basilar pulmonary edema. -Patient noted to have required BiPAP on admission. -remains off bipap and on minimal O2 support -Continue current dose of Lasix 60 mg every 12 hours. -Cardizem discontinued per cardiology to allow more room for diuresis. -metoprolol changed to coreg 12.5mg  bid -Continue Lipitor. -Strict I's and O's, daily weights. - Due to complex cardiac history cardiology consulted and following.   Acute respiratory failure with hypoxia (HCC) - Secondary to acute on chronic diastolic CHF exacerbation. -Patient on admission required BiPAP, now on minimal O2 support -Clinically improved -Continue IV Lasix. -See above problem #1 of CHF exacerbation. -Discontinued  BiPAP from room.  Vitamin D deficiency- (present on admission) - Resume home regiment cholecalciferol.   -Outpatient follow-up.  Neuropathy - Continue Neurontin.   HLD (hyperlipidemia)- (present on admission) - Continue statin.  Degenerative arthritis of left knee- (present on admission) - Outpatient follow-up with orthopedics.  Chronic venous insufficiency- (present on admission) - Outpatient follow-up.  Cellulitis of left leg - Patient with chronic venous stasis changes noted. -Left lower extremity with some erythema, slight warmth, nontender to palpation. -cont keflex  Diabetes (HCC) - Hemoglobin A1c 6.1 (06/19/2021) -Continue to hold oral hypoglycemic agents.  -SSI.  -glycemic trends stable  Hypertrophic obstructive cardiomyopathy ([place])- (present on admission) - Continue diuresis for acute CHF exacerbation. -Cardizem held per cardiology.   -Lopressor changed to coreg per above  -Cardiology recommending repeat 2D echo in the outpatient setting.   -Per cardiology.   Hypertension- (present on admission) -Patient on IV Lasix for diuresis. -Cardizem discontinued per cardiology.   -Continue on coreg per above -BP stable at this time  GERD (gastroesophageal reflux disease)- (present on admission) - PPI.        Subjective: Reports feeling better. Voiding well  Physical Exam: Vitals:   06/22/21 0832 06/22/21 0950 06/22/21 1100 06/22/21 1252  BP:    120/85  Pulse: (!) 54 (!) 53  (!) 53  Resp: 18 15  17   Temp:   98 F (36.7 C) 98 F (36.7 C)  TempSrc:    Oral  SpO2: 95% 99%  99%  Weight:      Height:       General exam: Awake, laying in bed, in nad Respiratory system: Normal respiratory effort, no wheezing Cardiovascular system: regular rate, s1, s2 Gastrointestinal system: Soft, nondistended, positive BS Central  nervous system: CN2-12 grossly intact, strength intact Extremities: Perfused, no clubbing Skin: Normal skin turgor, no notable skin lesions  seen Psychiatry: Mood normal // no visual hallucinations   Data Reviewed:  Labs reviewed  Family Communication: Pt in room, family not at bedside  Disposition: Status is: Inpatient Remains inpatient appropriate because: severity of illness requiring IV lasix          Planned Discharge Destination: Barriers to discharge: IF lasix    Author: Marylu Lund, MD 06/22/2021 1:53 PM  For on call review [email or web address].

## 2021-06-23 LAB — COMPREHENSIVE METABOLIC PANEL
ALT: 12 U/L (ref 0–44)
AST: 13 U/L — ABNORMAL LOW (ref 15–41)
Albumin: 3.1 g/dL — ABNORMAL LOW (ref 3.5–5.0)
Alkaline Phosphatase: 76 U/L (ref 38–126)
Anion gap: 7 (ref 5–15)
BUN: 30 mg/dL — ABNORMAL HIGH (ref 8–23)
CO2: 33 mmol/L — ABNORMAL HIGH (ref 22–32)
Calcium: 8.7 mg/dL — ABNORMAL LOW (ref 8.9–10.3)
Chloride: 97 mmol/L — ABNORMAL LOW (ref 98–111)
Creatinine, Ser: 0.86 mg/dL (ref 0.44–1.00)
GFR, Estimated: >60 mL/min (ref >60)
Glucose, Bld: 134 mg/dL — ABNORMAL HIGH (ref 70–99)
Potassium: 4 mmol/L (ref 3.5–5.1)
Sodium: 137 mmol/L (ref 135–145)
Total Bilirubin: 0.2 mg/dL — ABNORMAL LOW (ref 0.3–1.2)
Total Protein: 6.7 g/dL (ref 6.5–8.1)

## 2021-06-23 LAB — GLUCOSE, CAPILLARY
Glucose-Capillary: 105 mg/dL — ABNORMAL HIGH (ref 70–99)
Glucose-Capillary: 138 mg/dL — ABNORMAL HIGH (ref 70–99)
Glucose-Capillary: 119 mg/dL — ABNORMAL HIGH (ref 70–99)
Glucose-Capillary: 184 mg/dL — ABNORMAL HIGH (ref 70–99)

## 2021-06-23 MED ORDER — CARVEDILOL 25 MG PO TABS
25.0000 mg | ORAL_TABLET | Freq: Two times a day (BID) | ORAL | Status: DC
  Administered 2021-06-23 – 2021-06-26 (×6): 25 mg via ORAL
  Filled 2021-06-23 (×6): qty 1

## 2021-06-23 NOTE — Progress Notes (Signed)
Progress Note  Patient Name: Alyssa Ingram Date of Encounter: 06/24/2021  [facility] Cardiologist: Freada Bergeron, MD   Subjective  Feels better this morning. Still with episodes of SOB  Blood pressures better controlled HR mainly 60-70s On RA Cr stable at 0.84 Net negative 1.9L Wt 171>167lbs  Inpatient Medications    Scheduled Meds:  albuterol  2 puff Inhalation BID   allopurinol  100 mg Oral Daily   aspirin EC  81 mg Oral Daily   atorvastatin  80 mg Oral Daily   carvedilol  25 mg Oral BID WC   cephALEXin  500 mg Oral Q6H   chlorhexidine  15 mL Mouth Rinse BID   cholecalciferol  1,000 Units Oral Daily   diltiazem  120 mg Oral Daily   DULoxetine  60 mg Oral Daily   enoxaparin (LOVENOX) injection  40 mg Subcutaneous Q24H   furosemide  60 mg Intravenous BID   gabapentin  200 mg Oral BID   insulin aspart  0-15 Units Subcutaneous TID WC   insulin aspart  0-5 Units Subcutaneous QHS   loratadine  10 mg Oral Daily   mouth rinse  15 mL Mouth Rinse q12n4p   multivitamin with minerals  1 tablet Oral Daily   pantoprazole  40 mg Oral Daily   Continuous Infusions:   PRN Meds: acetaminophen **OR** acetaminophen, albuterol, lip balm, meclizine, ondansetron **OR** ondansetron (ZOFRAN) IV   Vital Signs    Vitals:   06/24/21 0440 06/24/21 0500 06/24/21 0748 06/24/21 0924  BP: 112/80   134/80  Pulse: 67  71 64  Resp: 18  18   Temp: 97.8 F (36.6 C)     TempSrc: Oral     SpO2: 95%  93%   Weight:  75.8 kg    Height:        Intake/Output Summary (Last 24 hours) at 06/24/2021 1003 Last data filed at 06/24/2021 0600 Gross per 24 hour  Intake 480 ml  Output 2450 ml  Net -1970 ml   Last 3 Weights 06/24/2021 06/23/2021 06/22/2021  Weight (lbs) 167 lb 1.7 oz 171 lb 15.3 oz 184 lb 1.4 oz  Weight (kg) 75.8 kg 78 kg 83.5 kg      Telemetry    NSR with occasional PVCs - Personally Reviewed  ECG    No new tracing- Personally Reviewed  Physical Exam   GEN: No  acute distress.   Neck: No JVD Cardiac: RRR, 2/6 systolic murmur Respiratory: Faint bibasilar crackles GI: Soft, nontender, non-distended  MS: Trace pitting edema with chronic venous stasis changes Neuro:  Nonfocal  Psych: Normal affect   Labs    High Sensitivity Troponin:   Recent Labs  Lab 06/19/21 0759  TROPONINIHS 16     Chemistry Recent Labs  Lab 06/19/21 1224 06/20/21 0249 06/21/21 0255 06/22/21 0240 06/23/21 0341 06/24/21 0348  NA  --  140 140 137 137 138  K  --  4.1 4.6 4.2 4.0 3.8  CL  --  106 103 98 97* 97*  CO2  --  26 29 30  33* 32  GLUCOSE  --  149* 102* 96 134* 154*  BUN  --  18 24* 28* 30* 31*  CREATININE  --  0.85 1.00 0.95 0.86 0.84  CALCIUM  --  8.5* 8.9 8.6* 8.7* 8.9  MG 2.0  --  1.7 2.3  --   --   PROT  --  6.8  --   --  6.7 6.7  ALBUMIN  --  3.1*  --   --  3.1* 3.1*  AST  --  14*  --   --  13* 15  ALT  --  12  --   --  12 14  ALKPHOS  --  80  --   --  76 82  BILITOT  --  0.6  --   --  0.2* 0.1*  GFRNONAA  --  >60 59* >60 >60 >60  ANIONGAP  --  8 8 9 7 9     Lipids No results for input(s): CHOL, TRIG, HDL, LABVLDL, LDLCALC, CHOLHDL in the last 168 hours.  Hematology Recent Labs  Lab 06/20/21 0249 06/21/21 0255 06/22/21 0240  WBC 11.6* 12.2* 8.2  RBC 3.58* 3.66* 3.72*  HGB 10.3* 10.6* 10.7*  HCT 31.5* 32.6* 33.0*  MCV 88.0 89.1 88.7  MCH 28.8 29.0 28.8  MCHC 32.7 32.5 32.4  RDW 14.7 14.8 14.9  PLT 249 278 259   Thyroid No results for input(s): TSH, FREET4 in the last 168 hours.  BNP Recent Labs  Lab 06/19/21 0759  BNP 896.0*    DDimer No results for input(s): DDIMER in the last 168 hours.   Radiology    No results found.  Cardiac Studies   Echo 06/19/21:  1. IVS thickneess ~24 mm at the basal septum. LVOT gradients not fully  assessed. HOCM. Left ventricular ejection fraction, by estimation, is 70  to 75%. The left ventricle has hyperdynamic function. The left ventricle  has no regional wall motion  abnormalities. There  is severe asymmetric left ventricular hypertrophy of  the basal-septal segment. Left ventricular diastolic parameters are  indeterminate.   2. Right ventricular systolic function is normal. The right ventricular  size is normal. There is severely elevated pulmonary artery systolic  pressure.   3. Left atrial size was severely dilated.   4. Right atrial size was severely dilated.   5. MV mean gradient 9.0 mmhg HR 85 bpm. Calcified valve. MVA 2.3 cm2. The  mitral valve is degenerative. Mild mitral valve regurgitation. Mild to  moderate mitral stenosis.   6. Aortic valve regurgitation is not visualized. Aortic valve  sclerosis/calcification is present, without any evidence of aortic  stenosis.   7. The inferior vena cava is dilated in size with >50% respiratory  variability, suggesting right atrial pressure of 8 mmHg.   Patient Profile     76 y.o. female with a hx of HOCM, HTN, DM2, HLD, GERD, HFpEF, and anemia who presented with worsening SOB and LE edema concerning for acute on chronic diastolic HF exacerbation for which Cardiology has been consulted.  Assessment & Plan    #Acute on chronic diastolic heart failure #HOCM #Acute respiratory failure Patient with known history of HOCM but has not seen cardiology since 2018. On admission, was hypoxic with evidence of volume overload initially requiring BiPAP. Received IV lasix with good response and has been weaned to [place]. TTE 02/05 with LVEF 70-75%, severe hypertrophy of the basal septum, peak gradient through AoV 79mmHg, peak LVOT gradient 17, severe PHTN (PASP 16mmHg) mild-to-mod MS.  - Will continue lasix 60mg  IV BID; hopefully transition to PO tomorrow vs Sunday - Continue coreg 25mg  BID - Continue Dilt 120mg  ER daily - Monitor I/Os and daily weights - Low Na diet - Will need continued follow-up as out-patient   #Hypertension Elevated this AM - Continue coreg 25mg  BID - Continue dilt 120mg  daily  - Okay to hold nodal agents for  HR<55; if  needing to hold frequently, will stop dilt   #Hyperlipidemia 01/18/2021: Cholesterol 191; HDL 57.00; LDL Cholesterol 115; Triglycerides 98.0; VLDL 19.6 - Continue lipitor 80mg  daily   #Lower extremity edema #Possible cellulitis Improving. With chronic venous stasis/lymphedema sikin changes -Continue keflex -Diuresis as above     For questions or updates, please contact [place] Please consult [email or web address] for contact info under        Signed, Freada Bergeron, MD  06/24/2021, 10:03 AM

## 2021-06-23 NOTE — Progress Notes (Signed)
Occupational Therapy Treatment Patient Details Name: Alyssa Ingram MRN: [id number] DOB: [birth year] Today's Date: 06/23/2021   History of present illness Patient is a 76 year old female admitted with acute hypoxic respiratory failure and acute on chronic diastolic HF. PMH: HTN, HOCM, HFpEF, HLD, DM2, GERD, obesity   OT comments  Patient improving with mobility with increased practiced. Patient initially mod A +1 to stand from the edge of bed and from recliner chair. Patient then min A to stand from bedside commode. Needs increased time/rest breaks with mobility as she gets anxious/fear of falling. Cues for pursed lip breathing. Patient set up for UB bathing while seated on commode and total A peri care as she is reliant on support of walker in standing. Strongly encourage use of bedside commode vs pure wick in order for patient to mobilize more and build strength to facilitate D/C home with family assist. If family unable to provide current assist levels may need short term rehab at D/C.    Recommendations for follow up therapy are one component of a multi-disciplinary discharge planning process, led by the attending physician.  Recommendations may be updated based on patient status, additional functional criteria and insurance authorization.    Follow Up Recommendations  Home health OT (if family can provide current assist levels)    Assistance Recommended at Discharge Frequent or constant Supervision/Assistance  Patient can return home with the following  A little help with walking and/or transfers;A lot of help with bathing/dressing/bathroom;Direct supervision/assist for medications management;Direct supervision/assist for financial management;Assist for transportation;Help with stairs or ramp for entrance   Equipment Recommendations  None recommended by OT       Precautions / Restrictions Precautions Precautions: Fall Precaution Comments: bad L knee Restrictions Weight Bearing  Restrictions: No       Mobility Bed Mobility Overal bed mobility: Needs Assistance Bed Mobility: Supine to Sit     Supine to sit: Min assist, HOB elevated     General bed mobility comments: Needing increased time and use of bed rail. Min A for trunk support       Balance Overall balance assessment: Needs assistance, History of Falls Sitting-balance support: Feet supported Sitting balance-Leahy Scale: Fair     Standing balance support: Bilateral upper extremity supported Standing balance-Leahy Scale: Poor                             ADL either performed or assessed with clinical judgement   ADL Overall ADL's : Needs assistance/impaired     Grooming: Wash/dry face;Set up;Sitting   Upper Body Bathing: Set up;Sitting Upper Body Bathing Details (indicate cue type and reason): Patient wash arms, chest and abdomen while seated on bedside commode             Toilet Transfer: Minimal assistance;Moderate assistance;Cueing for safety;Cueing for sequencing;Stand-pivot;BSC/3in1;Rolling walker (2 wheels) Toilet Transfer Details (indicate cue type and reason): Patient initially mod A to power up to standing to transfer onto commode. Min A +1 with standing from commode. Needs rest breaks as she gets anxious/fear of falling with mobility as well as mod verbal cues for safe hand placement Toileting- Clothing Manipulation and Hygiene: Total assistance;Sit to/from stand Toileting - Clothing Manipulation Details (indicate cue type and reason): For perianal care after bowel movement, patient reliant on UE support of walker     Functional mobility during ADLs: Minimal assistance;Moderate assistance;Cueing for safety;Cueing for sequencing;Rolling walker (2 wheels)  Cognition Arousal/Alertness: Awake/alert Behavior During Therapy: WFL for tasks assessed/performed Overall Cognitive Status: No family/caregiver present to determine baseline cognitive functioning                                  General Comments: Patient is tangential at times. Still believes she is in part of the ICU vs on 4th floor                   Pertinent Vitals/ Pain       Pain Assessment Pain Assessment: Faces Faces Pain Scale: No hurt         Frequency  Min 2X/week        Progress Toward Goals  OT Goals(current goals can now be found in the care plan section)  Progress towards OT goals: Progressing toward goals  Acute Rehab OT Goals Patient Stated Goal: Use potty chair OT Goal Formulation: With patient Time For Goal Achievement: 07/05/21 Potential to Achieve Goals: Good ADL Goals Pt Will Transfer to Toilet: with supervision;ambulating;regular height toilet (walker) Pt Will Perform Toileting - Clothing Manipulation and hygiene: with supervision;sit to/from stand;sitting/lateral leans Additional ADL Goal #1: Patient will follow 1 step directions with less than 25% verbal cues in order to participate in self care tasks.  Plan Discharge plan remains appropriate       AM-PAC OT "6 Clicks" Daily Activity     Outcome Measure   Help from another person eating meals?: None Help from another person taking care of personal grooming?: A Little Help from another person toileting, which includes using toliet, bedpan, or urinal?: A Lot Help from another person bathing (including washing, rinsing, drying)?: A Little Help from another person to put on and taking off regular upper body clothing?: A Little Help from another person to put on and taking off regular lower body clothing?: Total 6 Click Score: 16    End of Session Equipment Utilized During Treatment: Rolling walker (2 wheels)  OT Visit Diagnosis: Other abnormalities of gait and mobility (R26.89);Unsteadiness on feet (R26.81)   Activity Tolerance Patient tolerated treatment well   Patient Left in chair;with call bell/phone within reach   Nurse Communication Mobility status        Time:  6948-5462 OT Time Calculation (min): 38 min  Charges: OT General Charges $OT Visit: 1 Visit OT Treatments $Self Care/Home Management : 38-52 mins  Delbert Phenix OT OT pager: [phone]   Rosemary Holms 06/23/2021, 12:38 PM

## 2021-06-23 NOTE — Progress Notes (Signed)
Progress Note   Patient: Alyssa Ingram VPX:[id number] DOB: May 11, 1946 DOA: 06/19/2021     4 DOS: the patient was seen and examined on 06/23/2021   Brief hospital course: Patient 76 year old female history of hypertension, hokum, HFpEF, hyperlipidemia, diabetes type 2, GERD presented with worsening shortness of breath which started on the morning of admission.  Patient initially felt was having indigestion and gas tried some ginger ale with no improvement dyspnea persisted and got worse so family called EMS.  Patient noted to have missed several nighttime doses of her normal diuretics with recent increase in dietary indiscretion.  Patient noted to have elevated BNP, initially required BiPAP on admission and noted to be in acute CHF exacerbation.  Patient placed on IV Lasix.  Noted to have soft/borderline blood pressure admitted to the stepdown unit.  Cardiology consulted medications adjusted.  Assessment and Plan: * Acute on chronic heart failure ([place]) - Patient presented worsening shortness of breath and noted to be in acute CHF. -Secondary to missed doses of diuretics over several days, dietary indiscretion. -Patient with history of HOCM. -BNP noted to be elevated at 896 on presentation. -Chest x-ray concerning for basilar pulmonary edema. -Patient noted to have required BiPAP on admission. -remains off bipap and on minimal O2 support -Continue current dose of Lasix 60 mg every 12 hours, possible transition to PO lasix tomorrow vs Saturday -On dilt 120mg  ER daily -Coreg increased to 25mg  bid -Continue Lipitor. -Strict I's and O's, daily weights. - Due to complex cardiac history cardiology consulted and following.   Acute respiratory failure with hypoxia (HCC) - Secondary to acute on chronic diastolic CHF exacerbation. -Patient on admission required BiPAP, now on minimal O2 support -Clinically improved -Continue IV Lasix. -See above problem #1 of CHF exacerbation. -Discontinued BiPAP  from room.  Vitamin D deficiency- (present on admission) - Resume home regiment cholecalciferol.   -Outpatient follow-up.  Neuropathy - Continue Neurontin.   HLD (hyperlipidemia)- (present on admission) - Continue statin.  Degenerative arthritis of left knee- (present on admission) - Outpatient follow-up with orthopedics.  Chronic venous insufficiency- (present on admission) - Outpatient follow-up.  Cellulitis of left leg - Patient with chronic venous stasis changes noted. -Left lower extremity with some erythema, slight warmth, nontender to palpation. -cont on keflex  Diabetes (HCC) - Hemoglobin A1c 6.1 (06/19/2021) -Continue to hold oral hypoglycemic agents.  -SSI.  -glycemic trends remain stable  Hypertrophic obstructive cardiomyopathy ([place])- (present on admission) - Continue diuresis for acute CHF exacerbation. -Cardizem held per cardiology.   -Lopressor changed to coreg per above  -Cardiology recommending repeat 2D echo in the outpatient setting.   -Per cardiology.   Hypertension- (present on admission) -Patient on IV Lasix for diuresis. -Cardizem discontinued per cardiology.   -Continue on coreg per above -BP stable at this time  GERD (gastroesophageal reflux disease)- (present on admission) - PPI.        Subjective: Without complaints this AM  Physical Exam: Vitals:   06/23/21 0939 06/23/21 0940 06/23/21 1543 06/23/21 1709  BP:   139/69   Pulse:   64 70  Resp:   20   Temp:   98 F (36.7 C)   TempSrc:      SpO2: 96% 96% 98%   Weight:      Height:       General exam: Conversant, in no acute distress Respiratory system: normal chest rise, clear, no audible wheezing Cardiovascular system: regular rhythm, s1-s2 Gastrointestinal system: Nondistended, nontender, pos BS Central nervous system: No  seizures, no tremors Extremities: No cyanosis, no joint deformities Skin: No rashes, no pallor Psychiatry: Affect normal // no auditory hallucinations    Data Reviewed:  Labs reviewed  Family Communication: Pt in room, family not at bedside  Disposition: Status is: Inpatient Remains inpatient appropriate because: severity of illness requiring IV lasix    Planned Discharge Destination: Barriers to discharge: IF lasix    Author: Marylu Lund, MD 06/23/2021 6:38 PM  For on call review [email or web address].

## 2021-06-23 NOTE — Progress Notes (Signed)
Progress Note  Patient Name: Alyssa Ingram Date of Encounter: 06/23/2021  [facility] Cardiologist: Freada Bergeron, MD   Subjective   Feeling overall better. Breathing continues to improve.  HR 60-70s BP elevated 130-150s Cr stable 0.86 Net negative 1.7L Wt 184>17lbs  Inpatient Medications    Scheduled Meds:  albuterol  2 puff Inhalation BID   allopurinol  100 mg Oral Daily   aspirin EC  81 mg Oral Daily   atorvastatin  80 mg Oral Daily   carvedilol  12.5 mg Oral BID WC   cephALEXin  500 mg Oral Q6H   chlorhexidine  15 mL Mouth Rinse BID   cholecalciferol  1,000 Units Oral Daily   diltiazem  120 mg Oral Daily   DULoxetine  60 mg Oral Daily   enoxaparin (LOVENOX) injection  40 mg Subcutaneous Q24H   furosemide  60 mg Intravenous BID   gabapentin  200 mg Oral BID   insulin aspart  0-15 Units Subcutaneous TID WC   insulin aspart  0-5 Units Subcutaneous QHS   loratadine  10 mg Oral Daily   mouth rinse  15 mL Mouth Rinse q12n4p   multivitamin with minerals  1 tablet Oral Daily   pantoprazole  40 mg Oral Daily   Continuous Infusions:   PRN Meds: acetaminophen **OR** acetaminophen, albuterol, lip balm, meclizine, ondansetron **OR** ondansetron (ZOFRAN) IV   Vital Signs    Vitals:   06/23/21 0500 06/23/21 0807 06/23/21 0939 06/23/21 0940  BP:  (!) 144/88    Pulse:  65    Resp:  18    Temp:  98 F (36.7 C)    TempSrc:  Oral    SpO2:  95% 96% 96%  Weight: 78 kg     Height:        Intake/Output Summary (Last 24 hours) at 06/23/2021 1035 Last data filed at 06/23/2021 0600 Gross per 24 hour  Intake 600 ml  Output 2050 ml  Net -1450 ml    Last 3 Weights 06/23/2021 06/22/2021 06/21/2021  Weight (lbs) 171 lb 15.3 oz 184 lb 1.4 oz 184 lb 8.4 oz  Weight (kg) 78 kg 83.5 kg 83.7 kg      Telemetry    Sinus rhythm with PVCs - Personally Reviewed  ECG    No new tracing- Personally Reviewed  Physical Exam   GEN: No acute distress.   Neck: No  JVD Cardiac: RRR, 2/6 systolic murmur Respiratory: CTAB GI: Soft, nontender, non-distended  MS: Trace pitting edema with chronic venous stasis changes Neuro:  Nonfocal  Psych: Normal affect   Labs    High Sensitivity Troponin:   Recent Labs  Lab 06/19/21 0759  TROPONINIHS 16      Chemistry Recent Labs  Lab 06/19/21 0759 06/19/21 1224 06/20/21 0249 06/21/21 0255 06/22/21 0240 06/23/21 0341  NA 138  --  140 140 137 137  K 3.3*  --  4.1 4.6 4.2 4.0  CL 105  --  106 103 98 97*  CO2 25  --  26 29 30  33*  GLUCOSE 206*  --  149* 102* 96 134*  BUN 17  --  18 24* 28* 30*  CREATININE 0.91  --  0.85 1.00 0.95 0.86  CALCIUM 8.3*  --  8.5* 8.9 8.6* 8.7*  MG  --  2.0  --  1.7 2.3  --   PROT 6.6  --  6.8  --   --  6.7  ALBUMIN 3.1*  --  3.1*  --   --  3.1*  AST 19  --  14*  --   --  13*  ALT 12  --  12  --   --  12  ALKPHOS 88  --  80  --   --  76  BILITOT 0.5  --  0.6  --   --  0.2*  GFRNONAA >60  --  >60 59* >60 >60  ANIONGAP 8  --  8 8 9 7      Lipids No results for input(s): CHOL, TRIG, HDL, LABVLDL, LDLCALC, CHOLHDL in the last 168 hours.  Hematology Recent Labs  Lab 06/20/21 0249 06/21/21 0255 06/22/21 0240  WBC 11.6* 12.2* 8.2  RBC 3.58* 3.66* 3.72*  HGB 10.3* 10.6* 10.7*  HCT 31.5* 32.6* 33.0*  MCV 88.0 89.1 88.7  MCH 28.8 29.0 28.8  MCHC 32.7 32.5 32.4  RDW 14.7 14.8 14.9  PLT 249 278 259    Thyroid No results for input(s): TSH, FREET4 in the last 168 hours.  BNP Recent Labs  Lab 06/19/21 0759  BNP 896.0*     DDimer No results for input(s): DDIMER in the last 168 hours.   Radiology    No results found.  Cardiac Studies   Echo 06/19/21:  1. IVS thickneess ~24 mm at the basal septum. LVOT gradients not fully  assessed. HOCM. Left ventricular ejection fraction, by estimation, is 70  to 75%. The left ventricle has hyperdynamic function. The left ventricle  has no regional wall motion  abnormalities. There is severe asymmetric left ventricular  hypertrophy of  the basal-septal segment. Left ventricular diastolic parameters are  indeterminate.   2. Right ventricular systolic function is normal. The right ventricular  size is normal. There is severely elevated pulmonary artery systolic  pressure.   3. Left atrial size was severely dilated.   4. Right atrial size was severely dilated.   5. MV mean gradient 9.0 mmhg HR 85 bpm. Calcified valve. MVA 2.3 cm2. The  mitral valve is degenerative. Mild mitral valve regurgitation. Mild to  moderate mitral stenosis.   6. Aortic valve regurgitation is not visualized. Aortic valve  sclerosis/calcification is present, without any evidence of aortic  stenosis.   7. The inferior vena cava is dilated in size with >50% respiratory  variability, suggesting right atrial pressure of 8 mmHg.   Patient Profile     76 y.o. female with a hx of HOCM, HTN, DM2, HLD, GERD, HFpEF, and anemia who presented with worsening SOB and LE edema concerning for acute on chronic diastolic HF exacerbation for which Cardiology has been consulted.  Assessment & Plan    #Acute on chronic diastolic heart failure #HOCM #Acute respiratory failure Patient with known history of HOCM but has not seen cardiology since 2018. On admission, was hypoxic with evidence of volume overload initially requiring BiPAP. Received IV lasix with good response and has been weaned to [place]. TTE 02/05 with LVEF 70-75%, severe hypertrophy of the basal septum, peak gradient through AoV 69mmHg, peak LVOT gradient 17, severe PHTN (PASP 10mmHg) mild-to-mod MS.  - Continue lasix 60mg  IV BID; likely transition to PO tomorrow vs Sat - Increase coreg to 25mg  BID - Continue Dilt 120mg  ER daily - Monitor I/Os and daily weights - Low Na diet - Will need continued follow-up as out-patient   #Hypertension Elevated this AM - Increase coreg to 25mg  BID - Continue dilt 120mg  daily  - Okay to hold nodal agents for HR<55; if needing to  hold frequently, will  stop dilt   #Hyperlipidemia 01/18/2021: Cholesterol 191; HDL 57.00; LDL Cholesterol 115; Triglycerides 98.0; VLDL 19.6 - Continue lipitor 80mg  daily   #Lower extremity edema #Possible cellulitis Improving. With chronic venous stasis/lymphedema sikin changes -Continue keflex -Diuresis as above     For questions or updates, please contact [place] Please consult [email or web address] for contact info under        Signed, Freada Bergeron, MD  06/23/2021, 10:35 AM

## 2021-06-23 NOTE — Consult Note (Signed)
[facility] Inpatient Consult   06/23/2021  KENADY DOXTATER 1945-07-23 [id number]  [place] Management [facility] CM)   Patient chart reviewed with noted high risk score for unplanned readmission. Assessed for post hospital chronic care management needs with [facility] CM services.  Per review, patient's primary provider office offers embedded chronic case management team and program and is listed for transition of care follow up calls.    Plan: Will continue to follow for progression and disposition plans.  Of note, [facility] Care Management services does not replace or interfere with any services that are arranged by inpatient case management or social work.   Netta Cedars, MSN, RN [facility] [employer] [phone]  Toll free office [phone]

## 2021-06-24 LAB — GLUCOSE, CAPILLARY
Glucose-Capillary: 133 mg/dL — ABNORMAL HIGH (ref 70–99)
Glucose-Capillary: 142 mg/dL — ABNORMAL HIGH (ref 70–99)
Glucose-Capillary: 158 mg/dL — ABNORMAL HIGH (ref 70–99)
Glucose-Capillary: 154 mg/dL — ABNORMAL HIGH (ref 70–99)

## 2021-06-24 LAB — COMPREHENSIVE METABOLIC PANEL
ALT: 14 U/L (ref 0–44)
AST: 15 U/L (ref 15–41)
Albumin: 3.1 g/dL — ABNORMAL LOW (ref 3.5–5.0)
Alkaline Phosphatase: 82 U/L (ref 38–126)
Anion gap: 9 (ref 5–15)
BUN: 31 mg/dL — ABNORMAL HIGH (ref 8–23)
CO2: 32 mmol/L (ref 22–32)
Calcium: 8.9 mg/dL (ref 8.9–10.3)
Chloride: 97 mmol/L — ABNORMAL LOW (ref 98–111)
Creatinine, Ser: 0.84 mg/dL (ref 0.44–1.00)
GFR, Estimated: >60 mL/min (ref >60)
Glucose, Bld: 154 mg/dL — ABNORMAL HIGH (ref 70–99)
Potassium: 3.8 mmol/L (ref 3.5–5.1)
Sodium: 138 mmol/L (ref 135–145)
Total Bilirubin: 0.1 mg/dL — ABNORMAL LOW (ref 0.3–1.2)
Total Protein: 6.7 g/dL (ref 6.5–8.1)

## 2021-06-24 NOTE — Progress Notes (Signed)
Physical Therapy Treatment Patient Details Name: Alyssa Ingram MRN: [id number] DOB: 1945-07-30 Today's Date: 06/24/2021   History of Present Illness Patient is a 76 year old female admitted with acute hypoxic respiratory failure and acute on chronic diastolic HF. PMH: HTN, HOCM, HFpEF, HLD, DM2, GERD, obesity    PT Comments    General Comments: AxO x 3 very pleasant and willing lives at home with Son who "does everything for me". Assisted OOB to amb required increased time and effort.  General bed mobility comments: Needing increased time and use of bed rail and HOB elevated. General transfer comment: Assist to power up, steady, control descent. Cues for safety, technique.  Very slow to rise and move. Dragging/shuffled steps.  Also assisted with a toilet transfer.  Poor forward flex posure and excessive lean on walker.General Gait Details: assisted with amb to and from bathroom required increased time x 3.  Very slow, dragging, shuffled steps with excessive lean on walker.  C/O L knee pain instability and L shoulder pain. Required + 2 asisst back to bed.  Positioned to comfort.   Pt plans to D/C back home with Son who provides all care.   Recommendations for follow up therapy are one component of a multi-disciplinary discharge planning process, led by the attending physician.  Recommendations may be updated based on patient status, additional functional criteria and insurance authorization.  Follow Up Recommendations  Home health PT     Assistance Recommended at Discharge Frequent or constant Supervision/Assistance  Patient can return home with the following Help with stairs or ramp for entrance;Assist for transportation;Assistance with cooking/housework;A little help with walking and/or transfers   Equipment Recommendations  None recommended by PT    Recommendations for Other Services       Precautions / Restrictions Precautions Precautions: Fall Precaution Comments: B "bad knees"  L>R plus "bad" R shoulder Restrictions Weight Bearing Restrictions: No     Mobility  Bed Mobility Overal bed mobility: Needs Assistance Bed Mobility: Supine to Sit, Sit to Supine     Supine to sit: Mod assist Sit to supine: Max assist, +2 for safety/equipment   General bed mobility comments: Needing increased time and use of bed rail and HOB elevated.    Transfers Overall transfer level: Needs assistance Equipment used: Rolling walker (2 wheels) Transfers: Sit to/from Stand, Bed to chair/wheelchair/BSC Sit to Stand: Min assist, +2 physical assistance, +2 safety/equipment, From elevated surface   Step pivot transfers: Min assist, +2 physical assistance, +2 safety/equipment       General transfer comment: Assist to power up, steady, control descent. Cues for safety, technique.  Very slow to rise and move. Dragging/shuffled steps.  Also assisted with a toilet transfer.  Poor forward flex posure and excessive lean on walker.    Ambulation/Gait Ambulation/Gait assistance: Min assist Gait Distance (Feet): 22 Feet Assistive device: Rolling walker (2 wheels) Gait Pattern/deviations: Step-to pattern, Shuffle Gait velocity: decreased x 3     General Gait Details: assisted with amb to and from bathroom required increased time x 3.  Very slow, dragging, shuffled steps with excessive lean on walker.  C/O L knee pain instability and L shoulder pain.   Stairs             Wheelchair Mobility    Modified Rankin (Stroke Patients Only)       Balance  Cognition Arousal/Alertness: Awake/alert Behavior During Therapy: WFL for tasks assessed/performed Overall Cognitive Status: Within Functional Limits for tasks assessed                                 General Comments: AxO x 3 very pleasant and willing lives at home with Son who "does everything for me".        Exercises      General Comments         Pertinent Vitals/Pain Pain Assessment Pain Assessment: Faces Pain Location: general Pain Descriptors / Indicators: Aching Pain Intervention(s): Monitored during session, Repositioned    Home Living                          Prior Function            PT Goals (current goals can now be found in the care plan section) Progress towards PT goals: Progressing toward goals    Frequency    Min 3X/week      PT Plan Current plan remains appropriate    Co-evaluation              AM-PAC PT "6 Clicks" Mobility   Outcome Measure  Help needed turning from your back to your side while in a flat bed without using bedrails?: A Lot Help needed moving from lying on your back to sitting on the side of a flat bed without using bedrails?: A Lot Help needed moving to and from a bed to a chair (including a wheelchair)?: A Lot Help needed standing up from a chair using your arms (e.g., wheelchair or bedside chair)?: A Lot Help needed to walk in hospital room?: A Lot Help needed climbing 3-5 steps with a railing? : A Lot 6 Click Score: 12    End of Session Equipment Utilized During Treatment: Gait belt Activity Tolerance: Patient limited by fatigue Patient left: in bed;with call bell/phone within reach;with bed alarm set Nurse Communication: Mobility status PT Visit Diagnosis: Muscle weakness (generalized) (M62.81);Difficulty in walking, not elsewhere classified (R26.2)     Time: 2778-2423 PT Time Calculation (min) (ACUTE ONLY): 36 min  Charges:  $Gait Training: 8-22 mins $Therapeutic Activity: 8-22 mins                     Rica Koyanagi  PTA Acute  Rehabilitation Services Pager      [phone] Office      [phone]

## 2021-06-24 NOTE — Progress Notes (Signed)
Progress Note  Patient Name: Alyssa Ingram Date of Encounter: 06/25/2021  [facility] Cardiologist: Freada Bergeron, MD   Subjective   Feeling better this morning. No chest pain or SOB. Breathing significantly improved.   BP elevated 140s HR 60-70s Cr stable 0.81 I/Os not fully recorded Wt 167>169 (? Bed weights)  Inpatient Medications    Scheduled Meds:  albuterol  2 puff Inhalation BID   allopurinol  100 mg Oral Daily   aspirin EC  81 mg Oral Daily   atorvastatin  80 mg Oral Daily   carvedilol  25 mg Oral BID WC   cephALEXin  500 mg Oral Q6H   chlorhexidine  15 mL Mouth Rinse BID   cholecalciferol  1,000 Units Oral Daily   diltiazem  120 mg Oral Daily   DULoxetine  60 mg Oral Daily   enoxaparin (LOVENOX) injection  40 mg Subcutaneous Q24H   furosemide  60 mg Intravenous BID   gabapentin  200 mg Oral BID   insulin aspart  0-15 Units Subcutaneous TID WC   insulin aspart  0-5 Units Subcutaneous QHS   loratadine  10 mg Oral Daily   mouth rinse  15 mL Mouth Rinse q12n4p   multivitamin with minerals  1 tablet Oral Daily   pantoprazole  40 mg Oral Daily   Continuous Infusions:   PRN Meds: acetaminophen **OR** acetaminophen, albuterol, lip balm, meclizine, ondansetron **OR** ondansetron (ZOFRAN) IV   Vital Signs    Vitals:   06/24/21 1249 06/24/21 2134 06/25/21 0500 06/25/21 0523  BP: 121/64 136/65  (!) 147/85  Pulse: 62 62  67  Resp: 18 18  18   Temp: 97.7 F (36.5 C) 98.6 F (37 C)  98.4 F (36.9 C)  TempSrc: Oral Oral  Oral  SpO2: 100% 98%  93%  Weight:   77.1 kg   Height:        Intake/Output Summary (Last 24 hours) at 06/25/2021 0649 Last data filed at 06/24/2021 2200 Gross per 24 hour  Intake 1080 ml  Output 1300 ml  Net -220 ml   Last 3 Weights 06/25/2021 06/24/2021 06/23/2021  Weight (lbs) 169 lb 15.6 oz 167 lb 1.7 oz 171 lb 15.3 oz  Weight (kg) 77.1 kg 75.8 kg 78 kg      Telemetry    NSR - Personally Reviewed  ECG    No new  tracing- Personally Reviewed  Physical Exam   GEN: No acute distress.   Neck: No JVD Cardiac: RRR, 2/6 systolic murmur Respiratory: CTAB GI: Soft, nontender, non-distended  MS: Trace pitting edema with chronic venous stasis changes Neuro:  Nonfocal  Psych: Normal affect   Labs    High Sensitivity Troponin:   Recent Labs  Lab 06/19/21 0759  TROPONINIHS 16     Chemistry Recent Labs  Lab 06/19/21 1224 06/20/21 0249 06/21/21 0255 06/22/21 0240 06/23/21 0341 06/24/21 0348 06/25/21 0351  NA  --    < > 140 137 137 138 135  K  --    < > 4.6 4.2 4.0 3.8 3.8  CL  --    < > 103 98 97* 97* 96*  CO2  --    < > 29 30 33* 32 29  GLUCOSE  --    < > 102* 96 134* 154* 157*  BUN  --    < > 24* 28* 30* 31* 28*  CREATININE  --    < > 1.00 0.95 0.86 0.84 0.81  CALCIUM  --    < >  8.9 8.6* 8.7* 8.9 9.0  MG 2.0  --  1.7 2.3  --   --   --   PROT  --    < >  --   --  6.7 6.7 6.7  ALBUMIN  --    < >  --   --  3.1* 3.1* 3.1*  AST  --    < >  --   --  13* 15 16  ALT  --    < >  --   --  12 14 13   ALKPHOS  --    < >  --   --  76 82 78  BILITOT  --    < >  --   --  0.2* 0.1* 0.2*  GFRNONAA  --    < > 59* >60 >60 >60 >60  ANIONGAP  --    < > 8 9 7 9 10    < > = values in this interval not displayed.    Lipids No results for input(s): CHOL, TRIG, HDL, LABVLDL, LDLCALC, CHOLHDL in the last 168 hours.  Hematology Recent Labs  Lab 06/20/21 0249 06/21/21 0255 06/22/21 0240  WBC 11.6* 12.2* 8.2  RBC 3.58* 3.66* 3.72*  HGB 10.3* 10.6* 10.7*  HCT 31.5* 32.6* 33.0*  MCV 88.0 89.1 88.7  MCH 28.8 29.0 28.8  MCHC 32.7 32.5 32.4  RDW 14.7 14.8 14.9  PLT 249 278 259   Thyroid No results for input(s): TSH, FREET4 in the last 168 hours.  BNP Recent Labs  Lab 06/19/21 0759  BNP 896.0*    DDimer No results for input(s): DDIMER in the last 168 hours.   Radiology    No results found.  Cardiac Studies   Echo 06/19/21:  1. IVS thickneess ~24 mm at the basal septum. LVOT gradients not fully   assessed. HOCM. Left ventricular ejection fraction, by estimation, is 70  to 75%. The left ventricle has hyperdynamic function. The left ventricle  has no regional wall motion  abnormalities. There is severe asymmetric left ventricular hypertrophy of  the basal-septal segment. Left ventricular diastolic parameters are  indeterminate.   2. Right ventricular systolic function is normal. The right ventricular  size is normal. There is severely elevated pulmonary artery systolic  pressure.   3. Left atrial size was severely dilated.   4. Right atrial size was severely dilated.   5. MV mean gradient 9.0 mmhg HR 85 bpm. Calcified valve. MVA 2.3 cm2. The  mitral valve is degenerative. Mild mitral valve regurgitation. Mild to  moderate mitral stenosis.   6. Aortic valve regurgitation is not visualized. Aortic valve  sclerosis/calcification is present, without any evidence of aortic  stenosis.   7. The inferior vena cava is dilated in size with >50% respiratory  variability, suggesting right atrial pressure of 8 mmHg.   Patient Profile     76 y.o. female with a hx of HOCM, HTN, DM2, HLD, GERD, HFpEF, and anemia who presented with worsening SOB and LE edema concerning for acute on chronic diastolic HF exacerbation for which Cardiology has been consulted.  Assessment & Plan    #Acute on chronic diastolic heart failure #HOCM #Acute respiratory failure Patient with known history of HOCM but has not seen cardiology since 2018. On admission, was hypoxic with evidence of volume overload initially requiring BiPAP. Received IV lasix with good response and has been weaned to [place]. TTE 02/05 with LVEF 70-75%, severe hypertrophy of the basal septum, peak gradient through AoV 31mmHg,  peak LVOT gradient 17, severe PHTN (PASP 29mmHg) mild-to-mod MS.  - Will plan for 1 more dose lasix 60mg  IV this AM and transition to lasix 40mg  PO BID tonight - Continue coreg 25mg  BID - Increase dilt to 180mg  daily -  Monitor I/Os and daily weights - Low Na diet - Will need continued follow-up as out-patient   #Hypertension Elevated this AM - Continue coreg 25mg  BID - Increase dilt to 180mg  daily - Okay to hold nodal agents for HR<55; if needing to hold frequently, will stop dilt   #Hyperlipidemia 01/18/2021: Cholesterol 191; HDL 57.00; LDL Cholesterol 115; Triglycerides 98.0; VLDL 19.6 - Continue lipitor 80mg  daily   #Lower extremity edema #Possible cellulitis Improving. With chronic venous stasis/lymphedema sikin changes -Continue keflex -Diuresis as above     For questions or updates, please contact [place] Please consult [email or web address] for contact info under        Signed, Freada Bergeron, MD  06/25/2021, 6:49 AM

## 2021-06-24 NOTE — Care Management Important Message (Signed)
Important Message  Patient Details  Name: Alyssa Ingram MRN: [id number] Date of Birth: 1945-12-25   Medicare Important Message Given:  Yes     Kerin Salen 06/24/2021, 11:10 AM

## 2021-06-24 NOTE — Progress Notes (Signed)
Progress Note   Patient: Alyssa Ingram UVO:[id number] DOB: Jan 04, 1946 DOA: 06/19/2021     5 DOS: the patient was seen and examined on 06/24/2021   Brief hospital course: Patient 76 year old female history of hypertension, hokum, HFpEF, hyperlipidemia, diabetes type 2, GERD presented with worsening shortness of breath which started on the morning of admission.  Patient initially felt was having indigestion and gas tried some ginger ale with no improvement dyspnea persisted and got worse so family called EMS.  Patient noted to have missed several nighttime doses of her normal diuretics with recent increase in dietary indiscretion.  Patient noted to have elevated BNP, initially required BiPAP on admission and noted to be in acute CHF exacerbation.  Patient placed on IV Lasix.  Noted to have soft/borderline blood pressure admitted to the stepdown unit.  Cardiology consulted medications adjusted.  Assessment and Plan: * Acute on chronic heart failure ([place]) - Patient presented worsening shortness of breath and noted to be in acute CHF. -Secondary to missed doses of diuretics over several days, dietary indiscretion. -Patient with history of HOCM. -BNP noted to be elevated at 896 on presentation. -Chest x-ray concerning for basilar pulmonary edema. -Patient noted to have required BiPAP on admission. -remains off bipap and on minimal O2 support -Continue current dose of Lasix 60 mg every 12 hours, possible transition to PO lasix tomorrow vs Sunday per Cardiology -On dilt 120mg  ER daily -Coreg increased to 25mg  bid -Continue Lipitor. -Strict I's and O's, daily weights. - Due to complex cardiac history cardiology consulted and following.   Acute respiratory failure with hypoxia (HCC) - Secondary to acute on chronic diastolic CHF exacerbation. -Patient on admission required BiPAP, now on minimal O2 support -Clinically improved -Continue IV Lasix as per above -See above problem #1 of CHF  exacerbation. -Discontinued BiPAP from room.  Vitamin D deficiency- (present on admission) - Resume home regiment cholecalciferol.   -Outpatient follow-up.  Neuropathy - Continue Neurontin.   HLD (hyperlipidemia)- (present on admission) - Continue statin.  Degenerative arthritis of left knee- (present on admission) - Outpatient follow-up with orthopedics.  Chronic venous insufficiency- (present on admission) - Outpatient follow-up.  Cellulitis of left leg - Patient with chronic venous stasis changes noted. -Left lower extremity with some erythema, slight warmth, nontender to palpation. -cont on keflex, to complete on 2/14  Diabetes ([place]) - Hemoglobin A1c 6.1 (06/19/2021) -Continue to hold oral hypoglycemic agents.  -SSI.  -glycemic trends remain stable  Hypertrophic obstructive cardiomyopathy ([place])- (present on admission) - Continue diuresis for acute CHF exacerbation. -Cardizem held per cardiology.   -Lopressor changed to coreg per above  -Cardiology recommending repeat 2D echo in the outpatient setting.   -Per cardiology.   Hypertension- (present on admission) -Patient on IV Lasix for diuresis. -Cardizem discontinued per cardiology.   -Continue on coreg per above -BP stable at this time  GERD (gastroesophageal reflux disease)- (present on admission) - PPI.        Subjective: Reports feeling better. Eager to go home soon  Physical Exam: Vitals:   06/24/21 0500 06/24/21 0748 06/24/21 0924 06/24/21 1249  BP:   134/80 121/64  Pulse:  71 64 62  Resp:  18  18  Temp:    97.7 F (36.5 C)  TempSrc:    Oral  SpO2:  93%  100%  Weight: 75.8 kg     Height:       General exam: Awake, laying in bed, in nad Respiratory system: Normal respiratory effort, no wheezing Cardiovascular system:  regular rate, s1, s2 Gastrointestinal system: Soft, nondistended, positive BS Central nervous system: CN2-12 grossly intact, strength intact Extremities: Perfused, no  clubbing Skin: Normal skin turgor, no notable skin lesions seen Psychiatry: Mood normal // no visual hallucinations  ns   Data Reviewed:  Labs reviewed  Family Communication: Pt in room, family not at bedside  Disposition: Status is: Inpatient Remains inpatient appropriate because: severity of illness requiring IV lasix    Planned Discharge Destination: Barriers to discharge: IF lasix    Author: Marylu Lund, MD 06/24/2021 1:12 PM  For on call review [email or web address].

## 2021-06-25 LAB — GLUCOSE, CAPILLARY
Glucose-Capillary: 127 mg/dL — ABNORMAL HIGH (ref 70–99)
Glucose-Capillary: 115 mg/dL — ABNORMAL HIGH (ref 70–99)
Glucose-Capillary: 152 mg/dL — ABNORMAL HIGH (ref 70–99)
Glucose-Capillary: 146 mg/dL — ABNORMAL HIGH (ref 70–99)

## 2021-06-25 LAB — COMPREHENSIVE METABOLIC PANEL
ALT: 13 U/L (ref 0–44)
AST: 16 U/L (ref 15–41)
Albumin: 3.1 g/dL — ABNORMAL LOW (ref 3.5–5.0)
Alkaline Phosphatase: 78 U/L (ref 38–126)
Anion gap: 10 (ref 5–15)
BUN: 28 mg/dL — ABNORMAL HIGH (ref 8–23)
CO2: 29 mmol/L (ref 22–32)
Calcium: 9 mg/dL (ref 8.9–10.3)
Chloride: 96 mmol/L — ABNORMAL LOW (ref 98–111)
Creatinine, Ser: 0.81 mg/dL (ref 0.44–1.00)
GFR, Estimated: >60 mL/min (ref >60)
Glucose, Bld: 157 mg/dL — ABNORMAL HIGH (ref 70–99)
Potassium: 3.8 mmol/L (ref 3.5–5.1)
Sodium: 135 mmol/L (ref 135–145)
Total Bilirubin: 0.2 mg/dL — ABNORMAL LOW (ref 0.3–1.2)
Total Protein: 6.7 g/dL (ref 6.5–8.1)

## 2021-06-25 MED ORDER — FUROSEMIDE 40 MG PO TABS
40.0000 mg | ORAL_TABLET | Freq: Two times a day (BID) | ORAL | Status: DC
  Administered 2021-06-25 – 2021-06-26 (×2): 40 mg via ORAL
  Filled 2021-06-25 (×2): qty 1

## 2021-06-25 MED ORDER — DILTIAZEM HCL ER COATED BEADS 180 MG PO CP24
180.0000 mg | ORAL_CAPSULE | Freq: Every day | ORAL | Status: DC
  Administered 2021-06-25 – 2021-06-26 (×2): 180 mg via ORAL
  Filled 2021-06-25 (×2): qty 1

## 2021-06-25 MED ORDER — FUROSEMIDE 10 MG/ML IJ SOLN
60.0000 mg | Freq: Once | INTRAMUSCULAR | Status: AC
  Administered 2021-06-25: 60 mg via INTRAVENOUS
  Filled 2021-06-25: qty 6

## 2021-06-25 NOTE — Progress Notes (Signed)
Progress Note  Patient Name: Alyssa Ingram Date of Encounter: 06/26/2021  [facility] Cardiologist: Freada Bergeron, MD   Subjective   Feels okay this morning. Wants a different breakfast. No chest pain or SOB.  Cr stable 0.9 HR 60s Wt 166lbs from 169lbs  Inpatient Medications    Scheduled Meds:  albuterol  2 puff Inhalation BID   allopurinol  100 mg Oral Daily   aspirin EC  81 mg Oral Daily   atorvastatin  80 mg Oral Daily   carvedilol  25 mg Oral BID WC   cephALEXin  500 mg Oral Q6H   chlorhexidine  15 mL Mouth Rinse BID   cholecalciferol  1,000 Units Oral Daily   diltiazem  180 mg Oral Daily   DULoxetine  60 mg Oral Daily   enoxaparin (LOVENOX) injection  40 mg Subcutaneous Q24H   furosemide  40 mg Oral BID   gabapentin  200 mg Oral BID   insulin aspart  0-15 Units Subcutaneous TID WC   insulin aspart  0-5 Units Subcutaneous QHS   loratadine  10 mg Oral Daily   mouth rinse  15 mL Mouth Rinse q12n4p   multivitamin with minerals  1 tablet Oral Daily   pantoprazole  40 mg Oral Daily   Continuous Infusions:   PRN Meds: acetaminophen **OR** acetaminophen, albuterol, lip balm, meclizine, ondansetron **OR** ondansetron (ZOFRAN) IV   Vital Signs    Vitals:   06/25/21 2003 06/25/21 2213 06/26/21 0500 06/26/21 0600  BP: (!) 155/77   139/70  Pulse: 62   62  Resp: 18   20  Temp: 98.4 F (36.9 C)   98.9 F (37.2 C)  TempSrc: Oral   Oral  SpO2: 100% 92%  100%  Weight:   75.4 kg   Height:        Intake/Output Summary (Last 24 hours) at 06/26/2021 0655 Last data filed at 06/25/2021 1900 Gross per 24 hour  Intake 480 ml  Output 700 ml  Net -220 ml   Last 3 Weights 06/26/2021 06/25/2021 06/24/2021  Weight (lbs) 166 lb 3.6 oz 169 lb 15.6 oz 167 lb 1.7 oz  Weight (kg) 75.4 kg 77.1 kg 75.8 kg      Telemetry    NSR - Personally Reviewed  ECG    No new tracing- Personally Reviewed  Physical Exam   GEN: No acute distress.   Neck: No JVD Cardiac:  RRR, 2/6 systolic murmur Respiratory: CTAB GI: Soft, nontender, non-distended  MS: Trace pitting edema with chronic venous stasis changes Neuro:  Nonfocal  Psych: Normal affect   Labs    High Sensitivity Troponin:   Recent Labs  Lab 06/19/21 0759  TROPONINIHS 16     Chemistry Recent Labs  Lab 06/19/21 1224 06/20/21 0249 06/21/21 0255 06/22/21 0240 06/23/21 0341 06/24/21 0348 06/25/21 0351 06/26/21 0358  NA  --    < > 140 137   < > 138 135 135  K  --    < > 4.6 4.2   < > 3.8 3.8 3.7  CL  --    < > 103 98   < > 97* 96* 96*  CO2  --    < > 29 30   < > 32 29 30  GLUCOSE  --    < > 102* 96   < > 154* 157* 153*  BUN  --    < > 24* 28*   < > 31* 28* 31*  CREATININE  --    < >  1.00 0.95   < > 0.84 0.81 0.90  CALCIUM  --    < > 8.9 8.6*   < > 8.9 9.0 8.9  MG 2.0  --  1.7 2.3  --   --   --   --   PROT  --    < >  --   --    < > 6.7 6.7 6.7  ALBUMIN  --    < >  --   --    < > 3.1* 3.1* 3.1*  AST  --    < >  --   --    < > 15 16 18   ALT  --    < >  --   --    < > 14 13 15   ALKPHOS  --    < >  --   --    < > 82 78 80  BILITOT  --    < >  --   --    < > 0.1* 0.2* 0.4  GFRNONAA  --    < > 59* >60   < > >60 >60 >60  ANIONGAP  --    < > 8 9   < > 9 10 9    < > = values in this interval not displayed.    Lipids No results for input(s): CHOL, TRIG, HDL, LABVLDL, LDLCALC, CHOLHDL in the last 168 hours.  Hematology Recent Labs  Lab 06/20/21 0249 06/21/21 0255 06/22/21 0240  WBC 11.6* 12.2* 8.2  RBC 3.58* 3.66* 3.72*  HGB 10.3* 10.6* 10.7*  HCT 31.5* 32.6* 33.0*  MCV 88.0 89.1 88.7  MCH 28.8 29.0 28.8  MCHC 32.7 32.5 32.4  RDW 14.7 14.8 14.9  PLT 249 278 259   Thyroid No results for input(s): TSH, FREET4 in the last 168 hours.  BNP Recent Labs  Lab 06/19/21 0759  BNP 896.0*    DDimer No results for input(s): DDIMER in the last 168 hours.   Radiology    No results found.  Cardiac Studies   Echo 06/19/21:  1. IVS thickneess ~24 mm at the basal septum. LVOT gradients  not fully  assessed. HOCM. Left ventricular ejection fraction, by estimation, is 70  to 75%. The left ventricle has hyperdynamic function. The left ventricle  has no regional wall motion  abnormalities. There is severe asymmetric left ventricular hypertrophy of  the basal-septal segment. Left ventricular diastolic parameters are  indeterminate.   2. Right ventricular systolic function is normal. The right ventricular  size is normal. There is severely elevated pulmonary artery systolic  pressure.   3. Left atrial size was severely dilated.   4. Right atrial size was severely dilated.   5. MV mean gradient 9.0 mmhg HR 85 bpm. Calcified valve. MVA 2.3 cm2. The  mitral valve is degenerative. Mild mitral valve regurgitation. Mild to  moderate mitral stenosis.   6. Aortic valve regurgitation is not visualized. Aortic valve  sclerosis/calcification is present, without any evidence of aortic  stenosis.   7. The inferior vena cava is dilated in size with >50% respiratory  variability, suggesting right atrial pressure of 8 mmHg.   Patient Profile     76 y.o. female with a hx of HOCM, HTN, DM2, HLD, GERD, HFpEF, and anemia who presented with worsening SOB and LE edema concerning for acute on chronic diastolic HF exacerbation for which Cardiology has been consulted.  Assessment & Plan    #Acute on chronic diastolic heart failure #  HOCM #Acute respiratory failure Patient with known history of HOCM but has not seen cardiology since 2018. On admission, was hypoxic with evidence of volume overload initially requiring BiPAP. Received IV lasix with good response and has been weaned to [place]. TTE 02/05 with LVEF 70-75%, severe hypertrophy of the basal septum, peak gradient through AoV 49mmHg, peak LVOT gradient 17, severe PHTN (PASP 27mmHg) mild-to-mod MS.  - Continue lasix 40mg  PO BID  - Continue coreg 25mg  BID - Continue dilt 180mg  daily - Monitor I/Os and daily weights - Low Na diet - Will arrange  for out-patient CV follow-up   #Hypertension Improved - Continue coreg 25mg  BID - Continue dilt 180mg  daily   #Hyperlipidemia 01/18/2021: Cholesterol 191; HDL 57.00; LDL Cholesterol 115; Triglycerides 98.0; VLDL 19.6 - Continue lipitor 80mg  daily   #Lower extremity edema #Possible cellulitis Improving. With chronic venous stasis/lymphedema sikin changes -Continue keflex -Diuresis as above   Cardiology will sign-off. Will arrange for out-patient follow-up.    For questions or updates, please contact [place] Please consult [email or web address] for contact info under        Signed, Freada Bergeron, MD  06/26/2021, 6:55 AM

## 2021-06-25 NOTE — Progress Notes (Signed)
Physical Therapy Treatment Patient Details Name: Alyssa Ingram MRN: [id number] DOB: 10-04-1945 Today's Date: 06/25/2021   History of Present Illness Patient is a 76 year old female admitted with acute hypoxic respiratory failure and acute on chronic diastolic HF. PMH: HTN, HOCM, HFpEF, HLD, DM2, GERD, obesity    PT Comments    General Comments: AxO x 3 very pleasant and willing lives at home with Son who "does everything for me". Assisted with amb to and from bathroom.  General bed mobility comments: Needing increased time and use of bed rail and HOB elevated. General transfer comment: Assist to power up, steady, control descent. Cues for safety, technique.  Very slow to rise and move. Dragging/shuffled steps.  Also assisted with a toilet transfer.  Poor forward flex posure and excessive lean on walker through elbows. General Gait Details: assisted with amb to and from bathroom required increased time x 3.  Very slow, dragging, shuffled steps with excessive lean on walker.  C/O L knee pain instability and L shoulder pain. Son present during session and observed.  Gave Son a safety belt to use at home and advised him NOT to let her walk on her own "just yet" and use belt.  Recommendations for follow up therapy are one component of a multi-disciplinary discharge planning process, led by the attending physician.  Recommendations may be updated based on patient status, additional functional criteria and insurance authorization.  Follow Up Recommendations  Home health PT     Assistance Recommended at Discharge Frequent or constant Supervision/Assistance  Patient can return home with the following Help with stairs or ramp for entrance;Assist for transportation;Assistance with cooking/housework;A little help with walking and/or transfers   Equipment Recommendations  None recommended by PT    Recommendations for Other Services       Precautions / Restrictions Precautions Precautions:  Fall Precaution Comments: B "bad knees" L>R plus "bad" R shoulder Restrictions Weight Bearing Restrictions: No     Mobility  Bed Mobility Overal bed mobility: Needs Assistance Bed Mobility: Supine to Sit, Sit to Supine     Supine to sit: Mod assist Sit to supine: Max assist, +2 for safety/equipment   General bed mobility comments: Needing increased time and use of bed rail and HOB elevated.    Transfers Overall transfer level: Needs assistance Equipment used: Rolling walker (2 wheels) Transfers: Sit to/from Stand, Bed to chair/wheelchair/BSC Sit to Stand: Min assist, +2 physical assistance, +2 safety/equipment, From elevated surface   Step pivot transfers: Min assist, +2 physical assistance, +2 safety/equipment       General transfer comment: Assist to power up, steady, control descent. Cues for safety, technique.  Very slow to rise and move. Dragging/shuffled steps.  Also assisted with a toilet transfer.  Poor forward flex posure and excessive lean on walker through elbows.    Ambulation/Gait Ambulation/Gait assistance: Min assist Gait Distance (Feet): 22 Feet Assistive device: Rolling walker (2 wheels) Gait Pattern/deviations: Step-to pattern, Shuffle Gait velocity: decreased x 3     General Gait Details: assisted with amb to and from bathroom required increased time x 3.  Very slow, dragging, shuffled steps with excessive lean on walker.  C/O L knee pain instability and L shoulder pain.   Stairs             Wheelchair Mobility    Modified Rankin (Stroke Patients Only)       Balance  Cognition Arousal/Alertness: Awake/alert Behavior During Therapy: WFL for tasks assessed/performed Overall Cognitive Status: Within Functional Limits for tasks assessed                                 General Comments: AxO x 3 very pleasant and willing lives at home with Son who "does everything  for me".        Exercises      General Comments        Pertinent Vitals/Pain Pain Assessment Pain Assessment: Faces Faces Pain Scale: Hurts a little bit Pain Descriptors / Indicators: Aching Pain Intervention(s): Monitored during session, Repositioned    Home Living                          Prior Function            PT Goals (current goals can now be found in the care plan section) Progress towards PT goals: Progressing toward goals    Frequency           PT Plan Current plan remains appropriate    Co-evaluation              AM-PAC PT "6 Clicks" Mobility   Outcome Measure  Help needed turning from your back to your side while in a flat bed without using bedrails?: A Lot Help needed moving from lying on your back to sitting on the side of a flat bed without using bedrails?: A Lot Help needed moving to and from a bed to a chair (including a wheelchair)?: A Lot Help needed standing up from a chair using your arms (e.g., wheelchair or bedside chair)?: A Lot Help needed to walk in hospital room?: A Lot Help needed climbing 3-5 steps with a railing? : A Lot 6 Click Score: 12    End of Session Equipment Utilized During Treatment: Gait belt Activity Tolerance: Patient limited by fatigue Patient left: with call bell/phone within reach;with bed alarm set;in chair Nurse Communication: Mobility status PT Visit Diagnosis: Muscle weakness (generalized) (M62.81);Difficulty in walking, not elsewhere classified (R26.2)     Time: 3614-4315 PT Time Calculation (min) (ACUTE ONLY): 26 min  Charges:  $Gait Training: 8-22 mins $Therapeutic Activity: 8-22 mins                     {Ethel Veronica  PTA Acute  Rehabilitation Services Pager      [phone] Office      [phone]

## 2021-06-25 NOTE — Progress Notes (Signed)
Progress Note   Patient: Alyssa Ingram NFA:[id number] DOB: 1945/11/21 DOA: 06/19/2021     6 DOS: the patient was seen and examined on 06/25/2021   Brief hospital course: Patient 76 year old female history of hypertension, hokum, HFpEF, hyperlipidemia, diabetes type 2, GERD presented with worsening shortness of breath which started on the morning of admission.  Patient initially felt was having indigestion and gas tried some ginger ale with no improvement dyspnea persisted and got worse so family called EMS.  Patient noted to have missed several nighttime doses of her normal diuretics with recent increase in dietary indiscretion.  Patient noted to have elevated BNP, initially required BiPAP on admission and noted to be in acute CHF exacerbation.  Patient placed on IV Lasix.  Noted to have soft/borderline blood pressure admitted to the stepdown unit.  Cardiology consulted medications adjusted.  Assessment and Plan: * Acute on chronic heart failure ([place]) - Patient presented worsening shortness of breath and noted to be in acute CHF. -Secondary to missed doses of diuretics over several days, dietary indiscretion. -Patient with history of HOCM. -BNP noted to be elevated at 896 on presentation. -Chest x-ray concerning for basilar pulmonary edema. -Patient noted to have required BiPAP on admission. -remains off bipap and on minimal O2 support -Continue current dose of Lasix 60 mg every 12 hours with final dose during day with transition to 40mg  po bid lasix starting tonight -On dilt, increased to 180mg  -Cont coreg 25mg  bid -Continue Lipitor. -Strict I's and O's, daily weights. - Due to complex cardiac history cardiology consulted and following.   Acute respiratory failure with hypoxia ([facility]) - Secondary to acute on chronic diastolic CHF exacerbation. -Patient on admission required BiPAP, now on minimal O2 support -Clinically improved -Continue IV Lasix as per above with plan to transition to PO  lasix tonight -See above problem #1 of CHF exacerbation.  Vitamin D deficiency- (present on admission) - Resume home regiment cholecalciferol.   -Outpatient follow-up.  Neuropathy - Continue Neurontin.   HLD (hyperlipidemia)- (present on admission) - Continue statin.  Degenerative arthritis of left knee- (present on admission) - Outpatient follow-up with orthopedics.  Chronic venous insufficiency- (present on admission) - Outpatient follow-up.  Cellulitis of left leg - Patient with chronic venous stasis changes noted. -Left lower extremity presented with some erythema, slight warmth, nontender to palpation. -cont on keflex, to complete on 2/14  Diabetes ([place]) - Hemoglobin A1c 6.1 (06/19/2021) -Continue to hold oral hypoglycemic agents.  -SSI.  -glycemic trends remain stable  Hypertrophic obstructive cardiomyopathy ([place])- (present on admission) - Continue diuresis for acute CHF exacerbation. -Cardizem held per cardiology.   -Lopressor changed to coreg per above  -Cardiology recommending repeat 2D echo in the outpatient setting.   -Per cardiology.   Hypertension- (present on admission) -Patient on IV Lasix for diuresis. -Cardizem discontinued per cardiology.   -Continue on coreg per above -BP stable at this time  GERD (gastroesophageal reflux disease)- (present on admission) - PPI.        Subjective: States feeling better today  Physical Exam: Vitals:   06/25/21 0904 06/25/21 0906 06/25/21 1101 06/25/21 1138  BP:   120/75 114/60  Pulse:   75 76  Resp:   18 19  Temp:   97.8 F (36.6 C) 98.5 F (36.9 C)  TempSrc:   Oral Oral  SpO2: 94% 94% 96% 95%  Weight:      Height:       General exam: Awake, laying in bed, in nad Respiratory system: Normal  respiratory effort, no wheezing Cardiovascular system: regular rate, s1, s2 Gastrointestinal system: Soft, nondistended, positive BS Central nervous system: CN2-12 grossly intact, strength intact Extremities:  Perfused, no clubbing Skin: Normal skin turgor, no notable skin lesions seen Psychiatry: Mood normal // no visual hallucinations   Data Reviewed:  Labs reviewed  Family Communication: Pt in room, family not at bedside  Disposition: Status is: Inpatient Remains inpatient appropriate because: severity of illness requiring IV lasix    Planned Discharge Destination: Barriers to discharge: IF lasix    Author: Marylu Lund, MD 06/25/2021 2:14 PM  For on call review [email or web address].

## 2021-06-26 LAB — COMPREHENSIVE METABOLIC PANEL
ALT: 15 U/L (ref 0–44)
AST: 18 U/L (ref 15–41)
Albumin: 3.1 g/dL — ABNORMAL LOW (ref 3.5–5.0)
Alkaline Phosphatase: 80 U/L (ref 38–126)
Anion gap: 9 (ref 5–15)
BUN: 31 mg/dL — ABNORMAL HIGH (ref 8–23)
CO2: 30 mmol/L (ref 22–32)
Calcium: 8.9 mg/dL (ref 8.9–10.3)
Chloride: 96 mmol/L — ABNORMAL LOW (ref 98–111)
Creatinine, Ser: 0.9 mg/dL (ref 0.44–1.00)
GFR, Estimated: >60 mL/min (ref >60)
Glucose, Bld: 153 mg/dL — ABNORMAL HIGH (ref 70–99)
Potassium: 3.7 mmol/L (ref 3.5–5.1)
Sodium: 135 mmol/L (ref 135–145)
Total Bilirubin: 0.4 mg/dL (ref 0.3–1.2)
Total Protein: 6.7 g/dL (ref 6.5–8.1)

## 2021-06-26 LAB — GLUCOSE, CAPILLARY
Glucose-Capillary: 153 mg/dL — ABNORMAL HIGH (ref 70–99)
Glucose-Capillary: 110 mg/dL — ABNORMAL HIGH (ref 70–99)
Glucose-Capillary: 146 mg/dL — ABNORMAL HIGH (ref 70–99)

## 2021-06-26 MED ORDER — CEPHALEXIN 500 MG PO CAPS: 500.0000 mg | ORAL_CAPSULE | Freq: Four times a day (QID) | ORAL | 0 refills | Status: AC

## 2021-06-26 MED ORDER — POTASSIUM CHLORIDE CRYS ER 20 MEQ PO TBCR
40.0000 meq | EXTENDED_RELEASE_TABLET | Freq: Once | ORAL | Status: AC
  Administered 2021-06-26: 40 meq via ORAL
  Filled 2021-06-26: qty 2

## 2021-06-26 MED ORDER — DILTIAZEM HCL ER COATED BEADS 180 MG PO CP24: 180.0000 mg | ORAL_CAPSULE | Freq: Every day | ORAL | 0 refills | Status: DC

## 2021-06-26 MED ORDER — CARVEDILOL 25 MG PO TABS: 25.0000 mg | ORAL_TABLET | Freq: Two times a day (BID) | ORAL | 0 refills | Status: DC

## 2021-06-26 MED ORDER — CALCIUM CARBONATE ANTACID 500 MG PO CHEW
1.0000 | CHEWABLE_TABLET | Freq: Three times a day (TID) | ORAL | Status: DC | PRN
  Administered 2021-06-26: 200 mg via ORAL
  Filled 2021-06-26: qty 1

## 2021-06-26 MED ORDER — FUROSEMIDE 40 MG PO TABS: 40.0000 mg | ORAL_TABLET | Freq: Two times a day (BID) | ORAL | 0 refills | Status: DC

## 2021-06-26 NOTE — Progress Notes (Signed)
Nutrition Education Note  RD consulted for nutrition education regarding new onset CHF. MD requested RD to contact son regarding diet education. Son contacted via phone.   RD placed "Low Sodium Nutrition Therapy" handout from the Academy of [employer] in pt's discharge instructions. Reviewed patient's dietary recall. Provided examples on ways to decrease sodium intake in diet. Discouraged intake of processed foods and use of salt shaker. Encouraged fresh fruits and vegetables as well as whole grain sources of carbohydrates to maximize fiber intake. RD discussed why it is important for patient to adhere to diet recommendations, and emphasized the role of fluids, foods to avoid, and importance of weighing self daily. Teach back method used.  Expect good compliance.  Current diet order is carbohydrate modified, patient is consuming approximately 100% of meals at this time. Labs and medications reviewed. No further nutrition interventions warranted at this time. RD contact information provided. If additional nutrition issues arise, please re-consult RD. Plans for possible discharge today.   Corrin Parker, MS, RD, LDN RD pager number/after hours weekend pager number on Am[employer].

## 2021-06-26 NOTE — Discharge Instructions (Signed)
Low Sodium Nutrition Therapy  Eating less sodium can help you if you have high blood pressure, heart failure, or kidney or liver disease.   Your body needs a little sodium, but too much sodium can cause your body to hold onto extra water. This extra water will raise your blood pressure and can cause damage to your heart, kidneys, or liver as they are forced to work harder.   Sometimes you can see how the extra fluid affects you because your hands, legs, or belly swell. You may also hold water around your heart and lungs, which makes it hard to breathe.   Even if you take medication for blood pressure or a water pill (diuretic) to remove fluid, it is still important to have less salt in your diet.   Check with your primary care provider before drinking alcohol since it may affect the amount of fluid in your body and how your heart, kidneys, or liver work. Sodium in Food A low-sodium meal plan limits the sodium that you get from food and beverages to 1,500-2,000 milligrams (mg) per day. Salt is the main source of sodium. Read the nutrition label on the package to find out how much sodium is in one serving of a food.  Select foods with 140 milligrams (mg) of sodium or less per serving.  You may be able to eat one or two servings of foods with a little more than 140 milligrams (mg) of sodium if you are closely watching how much sodium you eat in a day.  Check the serving size on the label. The amount of sodium listed on the label shows the amount in one serving of the food. So, if you eat more than one serving, you will get more sodium than the amount listed.  Tips Cutting Back on Sodium Eat more fresh foods.  Fresh fruits and vegetables are low in sodium, as well as frozen vegetables and fruits that have no added juices or sauces.  Fresh meats are lower in sodium than processed meats, such as bacon, sausage, and hotdogs.  Not all processed foods are unhealthy, but some processed foods may have too  much sodium.  Eat less salt at the table and when cooking. One of the ingredients in salt is sodium.  One teaspoon of table salt has 2,300 milligrams of sodium.  Leave the salt out of recipes for pasta, casseroles, and soups. Be a Paramedic.  Food packages that say Salt-free, sodium-free, very low sodium, and low sodium have less than 140 milligrams of sodium per serving.  Beware of products identified as Unsalted, No Salt Added, Reduced Sodium, or Lower Sodium. These items may still be high in sodium. You should always check the nutrition label. Add flavors to your food without adding sodium.  Try lemon juice, lime juice, or vinegar.  Dry or fresh herbs add flavor.  Buy a sodium-free seasoning blend or make your own at home. You can purchase salt-free or sodium-free condiments like barbeque sauce in stores and online. Ask your registered dietitian nutritionist for recommendations and where to find them.   Eating in Restaurants Choose foods carefully when you eat outside your home. Restaurant foods can be very high in sodium. Many restaurants provide nutrition facts on their menus or their websites. If you cannot find that information, ask your server. Let your server know that you want your food to be cooked without salt and that you would like your salad dressing and sauces to be served on the  side.    Foods Recommended Food Group Foods Recommended  Grains Bread, bagels, rolls without salted tops Homemade bread made with reduced-sodium baking powder Cold cereals, especially shredded wheat and puffed rice Oats, grits, or cream of wheat Pastas, quinoa, and rice Popcorn, pretzels or crackers without salt Corn tortillas  Protein Foods Fresh meats and fish; [place] bacon (check the nutrition labels - make sure they are not packaged in a sodium solution) Canned or packed tuna (no more than 4 ounces at 1 serving) Beans and peas Soybeans) and tofu Eggs Nuts or nut butters  without salt  Dairy Milk or milk powder Plant milks, such as rice and soy Yogurt, including Greek yogurt Small amounts of natural cheese (blocks of cheese) or reduced-sodium cheese can be used in moderation. (Swiss, ricotta, and fresh mozzarella cheese are lower in sodium than the others) Cream Cheese Low sodium cottage cheese  Vegetables Fresh and frozen vegetables without added sauces or salt Homemade soups (without salt) Low-sodium, salt-free or sodium-free canned vegetables and soups  Fruit Fresh and canned fruits Dried fruits, such as raisins, cranberries, and prunes  Oils Tub or liquid margarine, regular or without salt Canola, corn, peanut, olive, safflower, or sunflower oils  Condiments Fresh or dried herbs such as basil, bay leaf, dill, mustard (dry), nutmeg, paprika, parsley, rosemary, sage, or thyme.  Low sodium ketchup Vinegar  Lemon or lime juice Pepper, red pepper flakes, and cayenne. Hot sauce contains sodium, but if you use just a drop or two, it will not add up to much.  Salt-free or sodium-free seasoning mixes and marinades Simple salad dressings: vinegar and oil   Foods Not Recommended Food Group Foods Not Recommended  Grains Breads or crackers topped with salt Cereals (hot/cold) with more than 300 mg sodium per serving Biscuits, cornbread, and other quick breads prepared with baking soda Pre-packaged bread crumbs Seasoned and packaged rice and pasta mixes Self-rising flours  Protein Foods Cured meats: Bacon, ham, sausage, pepperoni and hot dogs Canned meats (chili, vienna sausage, or sardines) Smoked fish and meats Frozen meals that have more than 600 mg of sodium per serving Egg substitute (with added sodium)  Dairy Buttermilk Processed cheese spreads Cottage cheese (1 cup may have over 500 mg of sodium; look for low-sodium.) American or feta cheese Shredded Cheese has more sodium than blocks of cheese String cheese  Vegetables Canned vegetables  (unless they are salt-free, sodium-free or low sodium) Frozen vegetables with seasoning and sauces Sauerkraut and pickled vegetables Canned or dried soups (unless they are salt-free, sodium-free, or low sodium) [place] fries and onion rings  Fruit Dried fruits preserved with additives that have sodium  Oils Salted butter or margarine, all types of olives  Condiments Salt, sea salt, kosher salt, onion salt, and garlic salt Seasoning mixes with salt Bouillon cubes Ketchup Barbeque sauce and Worcestershire sauce unless low sodium Soy sauce Salsa, pickles, olives, relish Salad dressings: ranch, blue cheese, [place], and [place].   Low Sodium Sample 1-Day Menu  Breakfast 1 cup cooked oatmeal  1 slice whole wheat bread toast  1 tablespoon peanut butter without salt  1 banana  1 cup 1% milk  Lunch Tacos made with: 2 corn tortillas   cup black beans, low sodium   cup roasted or grilled chicken (without skin)   avocado  Squeeze of lime juice  1 cup salad greens  1 tablespoon low-sodium salad dressing   cup strawberries  1 orange  Afternoon Snack 1/3 cup grapes  6 ounces yogurt  Evening Meal 3 ounces herb-baked fish  1 baked potato  2 teaspoons olive oil   cup cooked carrots  2 thick slices tomatoes on:  2 lettuce leaves  1 teaspoon olive oil  1 teaspoon balsamic vinegar  1 cup 1% milk  Evening Snack 1 apple   cup almonds without salt   Low-Sodium Vegetarian (Lacto-Ovo) Sample 1-Day Menu  Breakfast 1 cup cooked oatmeal  1 slice whole wheat toast  1 tablespoon peanut butter without salt  1 banana  1 cup 1% milk  Lunch Tacos made with: 2 corn tortillas   cup black beans, low sodium   cup roasted or grilled chicken (without skin)   avocado  Squeeze of lime juice  1 cup salad greens  1 tablespoon low-sodium salad dressing   cup strawberries  1 orange  Evening Meal Stir fry made with:  cup tofu  1 cup brown rice   cup broccoli   cup green beans   cup  peppers   tablespoon peanut oil  1 orange  1 cup 1% milk  Evening Snack 4 strips celery  2 tablespoons hummus  1 hard-boiled egg   Low-Sodium Vegan Sample 1-Day Menu  Breakfast 1 cup cooked oatmeal  1 tablespoon peanut butter without salt  1 cup blueberries  1 cup soymilk fortified with calcium, vitamin B12, and vitamin D  Lunch 1 small whole wheat pita   cup cooked lentils  2 tablespoons hummus  4 carrot sticks  1 medium apple  1 cup soymilk fortified with calcium, vitamin B12, and vitamin D  Evening Meal Stir fry made with:  cup tofu  1 cup brown rice   cup broccoli   cup green beans   cup peppers   tablespoon peanut oil  1 cup cantaloupe  Evening Snack 1 cup soy yogurt   cup mixed nuts  Copyright 2020  Academy of Nutrition and Dietetics. All rights reserved  Sodium Free Flavoring Tips  When cooking, the following items may be used for flavoring instead of salt or seasonings that contain sodium. Remember: A little bit of spice goes a long way! Be careful not to overseason. Spice Blend Recipe (makes about ? cup) 5 teaspoons onion powder  2 teaspoons garlic powder  2 teaspoons paprika  2 teaspoon dry mustard  1 teaspoon crushed thyme leaves   teaspoon white pepper   teaspoon celery seed Food Item Flavorings  Beef Basil, bay leaf, caraway, curry, dill, dry mustard, garlic, grape jelly, green pepper, mace, marjoram, mushrooms (fresh), nutmeg, onion or onion powder, parsley, pepper, rosemary, sage  Chicken Basil, cloves, cranberries, mace, mushrooms (fresh), nutmeg, oregano, paprika, parsley, pineapple, saffron, sage, savory, tarragon, thyme, tomato, turmeric  Egg Chervil, curry, dill, dry mustard, garlic or garlic powder, green pepper, jelly, mushrooms (fresh), nutmeg, onion powder, paprika, parsley, rosemary, tarragon, tomato  Fish Basil, bay leaf, chervil, curry, dill, dry mustard, green pepper, lemon juice, marjoram, mushrooms (fresh), paprika, pepper,  tarragon, tomato, turmeric  Lamb Cloves, curry, dill, garlic or garlic powder, mace, mint, mint jelly, onion, oregano, parsley, pineapple, rosemary, tarragon, thyme  Pork Applesauce, basil, caraway, chives, cloves, garlic or garlic powder, onion or onion powder, rosemary, thyme  Veal Apricots, basil, bay leaf, currant jelly, curry, ginger, marjoram, mushrooms (fresh), oregano, paprika  Vegetables Basil, dill, garlic or garlic powder, ginger, lemon juice, mace, marjoram, nutmeg, onion or onion powder, tarragon, tomato, sugar or sugar substitute, salt-free salad dressing, vinegar  Desserts Allspice, anise, cinnamon, cloves, ginger, mace, nutmeg, vanilla extract, other  extracts   Copyright 2020  Academy of Nutrition and Dietetics. All rights reserved  Fluid Restricted Nutrition Therapy  You have been prescribed this diet because your condition affects how much fluid you can eat or drink. If your heart, liver, or kidneys aren't working properly, you may not be able to effectively eliminate fluids from the body and this may cause swelling (edema) in the legs, arms, and/or stomach. Drink no more than _________ liters or ________ ounces or ________cups of fluid per day.  You don't need to stop eating or drinking the same fluids you normally would, but you may need to eat or drink less than usual.  Your registered dietitian nutritionist will help you determine the correct amount of fluid to consume during the day Breakfast Include fluids taken with medications  Lunch Include fluids taken with medications  Dinner Include fluids taken with medications  Bedtime Snack Include fluids taken with medications     Tips What Are Fluids?  A fluid is anything that is liquid or anything that would melt if left at room temperature. You will need to count these foods and liquids--including any liquid used to take medication--as part of your daily fluid intake. Some examples are: Alcohol (drink only with your  doctor's permission)  Coffee, tea, and other hot beverages  Gelatin (Jell-O)  Gravy  Ice cream, sherbet, sorbet  Ice cubes, ice chips  Milk, liquid creamer  Nutritional supplements  Popsicles  Vegetable and fruit juices; fluid in canned fruit  Watermelon  Yogurt  Soft drinks, lemonade, limeade  Soups  Syrup How Do I Measure My Fluid Intake? Record your fluid intake daily.  Tip: Every day, each time you eat or drink fluids, pour water in the same amount into an empty container that can hold the same amount of fluids you are allowed daily. This may help you keep track of how much fluid you are taking in throughout the day.  To accurately keep track of how much liquid you take in, measure the size of the cups, glasses, and bowls you use. If you eat soup, measure how much of it is liquid and how much is solid (such as noodles, vegetables, meat). Conversions for Measuring Fluid Intake  Milliliters (mL) Liters (L) Ounces (oz) Cups (c)  1000 1 32 4  1200 1.2 40 5  1500 1.5 50 6 1/4  1800 1.8 60 7 1/2  2000 2 67 8 1/3  Tips to Reduce Your Thirst Chew gum or suck on hard candy.  Rinse or gargle with mouthwash. Do not swallow.  Ice chips or popsicles my help quench thirst, but this too needs to be calculated into the total restriction. Melt ice chips or cubes first to figure out how much fluid they produce (for example, experiment with melting  cup ice chips or 2 ice cubes).  Add a lemon wedge to your water.  Limit how much salt you take in. A high salt intake might make you thirstier.  Don't eat or drink all your allowed liquids at once. Space your liquids out through the day.  Use small glasses and cups and sip slowly. If allowed, take your medications with fluids you eat or drink during a meal.   Fluid-Restricted Nutrition Therapy Sample 1-Day Menu  Breakfast 1 slice wheat toast  1 tablespoon peanut butter  1/2 cup yogurt (120 milliliters)  1/2 cup blueberries  1 cup milk (240  milliliters)   Lunch 3 ounces sliced [place]  2 slices whole wheat  bread  1/2 cup lettuce for sandwich  2 slices tomato for sandwich  1 ounce reduced-fat, reduced-sodium cheese  1/2 cup fresh carrot sticks  1 banana  1 cup unsweetened tea (240 milliliters)   Evening Meal 8 ounces soup (240 milliliters)  3 ounces salmon  1/2 cup quinoa  1 cup green beans  1 cup mixed greens salad  1 tablespoon olive oil  1 cup coffee (240 milliliters)  Evening Snack 1/2 cup sliced peaches  1/2 cup frozen yogurt (120 milliliters)  1 cup water (240 milliliters)  Copyright 2020  Academy of Nutrition and Dietetics. All rights reserved   Corrin Parker, MS, RD, LDN Clinical Dietitian Office phone [phone]

## 2021-06-26 NOTE — Discharge Summary (Signed)
Physician Discharge Summary   Patient: Alyssa Ingram MRN: [id number] DOB: 1945/06/18  Admit date:     06/19/2021  Discharge date: 06/26/21  Discharge Physician: Marylu Lund   PCP: [name], MD   Recommendations at discharge:    Follow up with Cardiology as scheduled Follow up with PCP in 1-2 weeks  Discharge Diagnoses: Principal Problem:   Acute on chronic heart failure (HCC) Active Problems:   GERD (gastroesophageal reflux disease)   Hypertension   Hypertrophic obstructive cardiomyopathy (HCC)   Diabetes ([place])   Cellulitis of left leg   Chronic venous insufficiency   Degenerative arthritis of left knee   HLD (hyperlipidemia)   Neuropathy   Vitamin D deficiency   Acute respiratory failure with hypoxia (HCC)  Resolved Problems:   * No resolved hospital problems. [facility] Course: Patient 76 year old female history of hypertension, hokum, HFpEF, hyperlipidemia, diabetes type 2, GERD presented with worsening shortness of breath which started on the morning of admission.  Patient initially felt was having indigestion and gas tried some ginger ale with no improvement dyspnea persisted and got worse so family called EMS.  Patient noted to have missed several nighttime doses of her normal diuretics with recent increase in dietary indiscretion.  Patient noted to have elevated BNP, initially required BiPAP on admission and noted to be in acute CHF exacerbation.  Patient placed on IV Lasix.  Noted to have soft/borderline blood pressure admitted to the stepdown unit.  Cardiology consulted medications adjusted.  Assessment and Plan: * Acute on chronic heart failure ([place]) - Patient presented worsening shortness of breath and noted to be in acute CHF. -Secondary to missed doses of diuretics over several days, dietary indiscretion. -Patient with history of HOCM. -BNP noted to be elevated at 896 on presentation. -Chest x-ray concerning for basilar pulmonary edema. -Patient noted  to have required BiPAP on admission. -remains off bipap and on minimal O2 support -Improved after receiving Lasix 60 mg every 12 hours, ultimately transitioning to 56m PO lasix -On dilt, increased to 1831m-Cont coreg 2547mid -Continue Lipitor. -Strict I's and O's, daily weights. - Due to complex cardiac history cardiology consulted and following.   Acute respiratory failure with hypoxia (HCC) - Secondary to acute on chronic diastolic CHF exacerbation. -Patient on admission required BiPAP, now on minimal O2 support -Clinically improved -Resolved with lasix -See above problem #1 of CHF exacerbation.  Vitamin D deficiency- (present on admission) - Resume home regiment cholecalciferol.   -Outpatient follow-up.  Neuropathy - Continue Neurontin.   HLD (hyperlipidemia)- (present on admission) - Continue statin.  Degenerative arthritis of left knee- (present on admission) - Outpatient follow-up with orthopedics.  Chronic venous insufficiency- (present on admission) - Outpatient follow-up.  Cellulitis of left leg - Patient with chronic venous stasis changes noted. -Left lower extremity presented with some erythema, slight warmth, nontender to palpation. -cont on keflex, to complete on 2/14  Diabetes ([place] Hemoglobin A1c 6.1 (06/19/2021) -Continue to hold oral hypoglycemic agents.  -SSI.  -glycemic trends remain stable  Hypertrophic obstructive cardiomyopathy ([place](present on admission) - Continue diuresis for acute CHF exacerbation. -Cardizem changed to 180m27mily -Lopressor changed to coreg per above  -Cardiology recommending repeat 2D echo in the outpatient setting.     Hypertension- (present on admission) -Continue on coreg, cardizem, lasix per above -BP stable at this time  GERD (gastroesophageal reflux disease)- (present on admission) - PPI.         Consultants: Cardiology Procedures performed:  Disposition: Home Diet recommendation:  Cardiac  diet  DISCHARGE MEDICATION: Allergies as of 06/26/2021       Reactions   Sulfa Antibiotics Hives   Metformin And Related Other (See Comments)   intolerance   Other Hives   Unknown drug   Prednisone Swelling        Medication List     STOP taking these medications    gabapentin 100 MG capsule Commonly known as: NEURONTIN   lidocaine 5 % Commonly known as: Lidoderm   metoprolol tartrate 50 MG tablet Commonly known as: LOPRESSOR       TAKE these medications    albuterol 108 (90 Base) MCG/ACT inhaler Commonly known as: ProAir HFA Inhale 2 puffs into the lungs 2 (two) times daily. What changed: additional instructions   albuterol 0.63 MG/3ML nebulizer solution Commonly known as: ACCUNEB INHALE 1 VIAL BY MOUTH VIA NEBULIZATION EVERY 6 HOURS AS NEEDED FOR WHEEZING. What changed: See the new instructions.   allopurinol 100 MG tablet Commonly known as: ZYLOPRIM Take 1 tablet (100 mg total) by mouth daily.   ammonium lactate 12 % cream Commonly known as: AMLACTIN APPLY TOPICALLY AS NEEDED FOR DRY SKIN.   aspirin 81 MG tablet Take 162 mg by mouth daily.   atorvastatin 80 MG tablet Commonly known as: LIPITOR Take 1 tablet (80 mg total) by mouth daily.   carvedilol 25 MG tablet Commonly known as: COREG Take 1 tablet (25 mg total) by mouth 2 (two) times daily with a meal.   cephALEXin 500 MG capsule Commonly known as: KEFLEX Take 1 capsule (500 mg total) by mouth every 6 (six) hours for 3 days.   cetirizine 10 MG tablet Commonly known as: ZYRTEC TAKE 1 TABLET BY MOUTH EVERY DAY AS NEEDED What changed:  how much to take how to take this when to take this additional instructions   D3 ADULT PO Take 1 tablet by mouth daily.   diltiazem 180 MG 24 hr capsule Commonly known as: CARDIZEM CD Take 1 capsule (180 mg total) by mouth daily. Start taking on: June 27, 2021 What changed:  medication strength how much to take Another medication with the same  name was removed. Continue taking this medication, and follow the directions you see here.   DULoxetine 60 MG capsule Commonly known as: Cymbalta Take 1 capsule (60 mg total) by mouth daily.   FreeStyle Libre 14 Day Sensor Misc Apply 1 Device topically every 14 (fourteen) days. E11.9   FreeStyle Libre Reader [employer] Apply 1 Device topically 4 (four) times daily as needed. E11.9   furosemide 40 MG tablet Commonly known as: LASIX Take 1 tablet (40 mg total) by mouth 2 (two) times daily. What changed:  medication strength See the new instructions.   glipiZIDE 2.5 MG 24 hr tablet Commonly known as: GLUCOTROL XL Take 1 tablet (2.5 mg total) by mouth daily with breakfast.   Lancets Misc Use as directed three times daily E11.9   meclizine 25 MG tablet Commonly known as: ANTIVERT TAKE 1 TABLET BY MOUTH THREE TIMES A DAY as needed What changed:  how much to take how to take this when to take this reasons to take this additional instructions   MULTIVITAMIN ADULT PO Take 1 tablet by mouth daily.   ONE TOUCH ULTRA 2 w/Device Kit Use as directed three times daily E11.9   OneTouch Ultra test strip Generic drug: glucose blood Use as instructed three times daily E11.9   pantoprazole 40 MG tablet Commonly known  as: PROTONIX TAKE 1 TABLET BY MOUTH EVERY DAY   triamcinolone 55 MCG/ACT Aero nasal inhaler Commonly known as: Nasacort AQ Place 2 sprays into the nose daily. What changed:  when to take this reasons to take this        Follow-up Information     [facility].   Specialty: Cardiology Why: they will call with date and time Contact information: [address] [phone] [place]        [name], MD Follow up in 2 week(s).   Specialties: Internal Medicine, Radiology Why: Hospital follow up Contact information: [place]  [address] [phone]         Freada Bergeron, MD .   Specialties: Cardiology, Radiology Contact information: [address] [address] [phone]                 Discharge Exam: Danley Danker Weights   06/24/21 0500 06/25/21 0500 06/26/21 0500  Weight: 75.8 kg 77.1 kg 75.4 kg   General exam: Awake, laying in bed, in nad Respiratory system: Normal respiratory effort, no wheezing Cardiovascular system: regular rate, s1, s2 Gastrointestinal system: Soft, nondistended, positive BS Central nervous system: CN2-12 grossly intact, strength intact Extremities: Perfused, no clubbing Skin: Normal skin turgor, no notable skin lesions seen Psychiatry: Mood normal // no visual hallucinations   Condition at discharge: good  The results of significant diagnostics from this hospitalization (including imaging, microbiology, ancillary and laboratory) are listed below for reference.   Imaging Studies: DG Chest Portable 1 View  Result Date: 06/19/2021 CLINICAL DATA:  76 year old female with shortness of breath since 0400 hours. Wheezing and abnormal pulmonary auscultation. EXAM: PORTABLE CHEST 1 VIEW COMPARISON:  Portable chest 07/21/2020 and earlier. FINDINGS: Portable AP semi upright view at 0823 hours. Rotated to the left. Similar lung volumes. Mild cardiomegaly suspected. Other mediastinal contours are within normal limits. Visualized tracheal air column is within normal limits. Streaky and indistinct opacity at both lung bases. But upper lung pulmonary vascularity seems to remain normal. No pneumothorax. No air bronchograms or definite effusion. Negative visible bowel gas. Chronic degenerative changes at the right shoulder. No acute osseous abnormality identified. IMPRESSION: Acute nonspecific bibasilar opacity. Top differential considerations include basilar predominant pulmonary edema, infection, less likely atelectasis. No definite effusion. Electronically  Signed   By: Genevie Ann M.D.   On: 06/19/2021 08:44   DG Shoulder Right Portable  Result Date: 06/19/2021 CLINICAL DATA:  Recent falls.  Right superior shoulder pain. EXAM: RIGHT SHOULDER - 1 VIEW COMPARISON:  Chest radiograph-earlier same day FINDINGS: Examination is degraded due to patient body habitus. No definite fracture or dislocation. Severe degenerative change of the right glenohumeral joint with joint space loss, subchondral sclerosis and osteophytosis. Moderate degenerative change of the right AC joint with joint space loss, subchondral sclerosis and inferiorly directed osteophytosis. Erosions are noted about the rotator cuff insertion site. No evidence of calcific tendinitis. Limited visualization of the adjacent thorax demonstrates enlarged cardiac silhouette with indistinct pulmonary vasculature, potentially accentuated due to obliquity and technique. IMPRESSION: 1. No acute findings. 2. 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 Clinical correlation is advised. 3. Moderate degenerative change of the right AC joint. 4. Suspected cardiomegaly and pulmonary edema, incompletely evaluated. Electronically Signed   By: Jenny Reichmann  Watts M.D.   On: 06/19/2021 09:43   ECHOCARDIOGRAM COMPLETE  Result Date: 06/19/2021    ECHOCARDIOGRAM REPORT   Patient Name:   Alyssa Ingram Date of Exam: 06/19/2021 Medical Rec #:  [id number]        Height:       60.0 in Accession #:    [id number]       Weight:       186.3 lb Date of Birth:  07-23-1945        BSA:          1.811 m Patient Age:    76 years         BP:           166/90 mmHg Patient Gender: F                HR:           85 bpm. Exam Location:  Inpatient Procedure: 2D Echo Indications:    CHF  History:        Patient has prior history of Echocardiogram examinations, most                 recent 07/24/2020. CHF; Risk  Factors:Diabetes and Hypertension.  Sonographer:    Jefferey Pica Referring Phys: [id number] [place]  1. IVS thickneess ~24 mm at the basal septum. LVOT gradients not fully assessed. HOCM. Left ventricular ejection fraction, by estimation, is 70 to 75%. The left ventricle has hyperdynamic function. The left ventricle has no regional wall motion abnormalities. There is severe asymmetric left ventricular hypertrophy of the basal-septal segment. Left ventricular diastolic parameters are indeterminate.  2. Right ventricular systolic function is normal. The right ventricular size is normal. There is severely elevated pulmonary artery systolic pressure.  3. Left atrial size was severely dilated.  4. Right atrial size was severely dilated.  5. MV mean gradient 9.0 mmhg HR 85 bpm. Calcified valve. MVA 2.3 cm2. The mitral valve is degenerative. Mild mitral valve regurgitation. Mild to moderate mitral stenosis.  6. Aortic valve regurgitation is not visualized. Aortic valve sclerosis/calcification is present, without any evidence of aortic stenosis.  7. The inferior vena cava is dilated in size with >50% respiratory variability, suggesting right atrial pressure of 8 mmHg. Comparison(s): No significant change from prior study. FINDINGS  Left Ventricle: IVS thickneess ~24 mm at the basal septum. LVOT gradients not fully assessed. HOCM. Left ventricular ejection fraction, by estimation, is 70 to 75%. The left ventricle has hyperdynamic function. The left ventricle has no regional wall motion abnormalities. The left ventricular internal cavity size was small. There is severe asymmetric left ventricular hypertrophy of the basal-septal segment. Left ventricular diastolic parameters are indeterminate. Right Ventricle: The right ventricular size is normal. No increase in right ventricular wall thickness. Right ventricular systolic function is normal. There is severely elevated pulmonary artery systolic pressure. The  tricuspid regurgitant velocity is 4.07 m/s, and with an assumed right atrial pressure of 8 mmHg, the estimated right ventricular systolic pressure is 88.4 mmHg. Left Atrium: Left atrial size was severely dilated. Right Atrium: Right atrial size was severely dilated. Pericardium: There is no evidence of pericardial effusion. Mitral Valve: MV mean gradient 9.0 mmhg HR 85 bpm. Calcified valve. MVA 2.3 cm2. The mitral valve is degenerative in appearance. There is severe calcification of the mitral valve leaflet(s). Mild mitral valve regurgitation. Mild to moderate mitral valve stenosis. MV peak gradient, 17.5 mmHg. The mean mitral valve gradient is 9.0 mmHg. Tricuspid  Valve: The tricuspid valve is grossly normal. Tricuspid valve regurgitation is mild. Aortic Valve: Aortic valve regurgitation is not visualized. Aortic valve sclerosis/calcification is present, without any evidence of aortic stenosis. Aortic valve mean gradient measures 9.5 mmHg. Aortic valve peak gradient measures 21.4 mmHg. Pulmonic Valve: The pulmonic valve was grossly normal. Pulmonic valve regurgitation is not visualized. Aorta: The aortic root and ascending aorta are structurally normal, with no evidence of dilitation. Venous: The inferior vena cava is dilated in size with greater than 50% respiratory variability, suggesting right atrial pressure of 8 mmHg. IAS/Shunts: No atrial level shunt detected by color flow Doppler.  LEFT VENTRICLE PLAX 2D LVIDd:         4.30 cm Diastology LVIDs:         1.30 cm LV e' medial:    3.25 cm/s LV PW:         1.50 cm LV E/e' medial:  49.8 LV IVS:        2.30 cm LV e' lateral:   3.62 cm/s                        LV E/e' lateral: 44.8  RIGHT VENTRICLE             IVC RV Basal diam:  3.10 cm     IVC diam: 2.20 cm RV Mid diam:    2.70 cm RV S prime:     17.60 cm/s TAPSE (M-mode): 2.3 cm LEFT ATRIUM              Index        RIGHT ATRIUM           Index LA diam:        5.10 cm  2.82 cm/m   RA Area:     16.80 cm LA Vol  (A2C):   131.0 ml 72.33 ml/m  RA Volume:   49.70 ml  27.44 ml/m LA Vol (A4C):   113.0 ml 62.39 ml/m LA Biplane Vol: 123.0 ml 67.91 ml/m  AORTIC VALVE                    PULMONIC VALVE AV Vmax:           231.50 cm/s  PV Vmax:       1.04 m/s AV Vmean:          138.500 cm/s PV Peak grad:  4.3 mmHg AV VTI:            0.392 m AV Peak Grad:      21.4 mmHg AV Mean Grad:      9.5 mmHg LVOT Vmax:         197.00 cm/s LVOT Vmean:        114.000 cm/s LVOT VTI:          0.350 m LVOT/AV VTI ratio: 0.89  AORTA Ao Root diam: 3.30 cm Ao Asc diam:  3.40 cm MITRAL VALVE                TRICUSPID VALVE MV Area (PHT): 2.39 cm     TR Peak grad:   66.3 mmHg MV Peak grad:  17.5 mmHg    TR Vmax:        407.00 cm/s MV Mean grad:  9.0 mmHg MV Vmax:       2.09 m/s     SHUNTS MV Vmean:      144.0 cm/s   Systemic VTI: 0.35 m MV Decel  Time: 318 msec MR Peak grad: 132.7 mmHg MR Vmax:      576.00 cm/s MV E velocity: 162.00 cm/s MV A velocity: 156.00 cm/s MV E/A ratio:  1.04 Landscape architect signed by Phineas Inches Signature Date/Time: 06/19/2021/3:36:55 PM    Final     Microbiology: Results for orders placed or performed during the hospital encounter of 06/19/21  Resp Panel by RT-PCR (Flu A&B, Covid) Nasopharyngeal Swab     Status: None   Collection Time: 06/19/21  9:24 AM   Specimen: Nasopharyngeal Swab; Nasopharyngeal(NP) swabs in vial transport medium  Result Value Ref Range Status   SARS Coronavirus 2 by RT PCR NEGATIVE NEGATIVE Final    Comment: (NOTE) SARS-CoV-2 target nucleic acids are NOT DETECTED.  The SARS-CoV-2 RNA is generally detectable in upper respiratory specimens during the acute phase of infection. The lowest concentration of SARS-CoV-2 viral copies this assay can detect is 138 copies/mL. A negative result does not preclude SARS-Cov-2 infection and should not be used as the sole basis for treatment or other patient management decisions. A negative result may occur with  improper specimen  collection/handling, submission of specimen other than nasopharyngeal swab, presence of viral mutation(s) within the areas targeted by this assay, and inadequate number of viral copies(<138 copies/mL). A negative result must be combined with clinical observations, patient history, and epidemiological information. The expected result is Negative.  Fact Sheet for Patients:  EntrepreneurPulse.com.au  Fact Sheet for Healthcare Providers:  IncredibleEmployment.be  This test is no t yet approved or cleared by the [place] FDA and  has been authorized for detection and/or diagnosis of SARS-CoV-2 by FDA under an Emergency Use Authorization (EUA). This EUA will remain  in effect (meaning this test can be used) for the duration of the COVID-19 declaration under Section 564(b)(1) of the Act, 21 U.S.C.section 360bbb-3(b)(1), unless the authorization is terminated  or revoked sooner.       Influenza A by PCR NEGATIVE NEGATIVE Final   Influenza B by PCR NEGATIVE NEGATIVE Final    Comment: (NOTE) The Xpert Xpress SARS-CoV-2/FLU/RSV plus assay is intended as an aid in the diagnosis of influenza from Nasopharyngeal swab specimens and should not be used as a sole basis for treatment. Nasal washings and aspirates are unacceptable for Xpert Xpress SARS-CoV-2/FLU/RSV testing.  Fact Sheet for Patients: EntrepreneurPulse.com.au  Fact Sheet for Healthcare Providers: IncredibleEmployment.be  This test is not yet approved or cleared by the [place] FDA and has been authorized for detection and/or diagnosis of SARS-CoV-2 by FDA under an Emergency Use Authorization (EUA). This EUA will remain in effect (meaning this test can be used) for the duration of the COVID-19 declaration under Section 564(b)(1) of the Act, 21 U.S.C. section 360bbb-3(b)(1), unless the authorization is terminated or revoked.  Performed at [facility], [place] [address], [place], [place] [address]   MRSA Next Gen by PCR, Nasal     Status: None   Collection Time: 06/19/21 12:17 PM   Specimen: Nasal Mucosa; Nasal Swab  Result Value Ref Range Status   MRSA by PCR Next Gen NOT DETECTED NOT DETECTED Final    Comment: (NOTE) The GeneXpert MRSA Assay (FDA approved for NASAL specimens only), is one component of a comprehensive MRSA colonization surveillance program. It is not intended to diagnose MRSA infection nor to guide or monitor treatment for MRSA infections. Test performance is not FDA approved in patients less than 38 years old. Performed at [facility], [place] [address] [place], [place] [address]     Labs: CBC: Recent Labs  Lab 06/20/21 0249 06/21/21 0255 06/22/21 0240  WBC 11.6* 12.2* 8.2  NEUTROABS  --  9.7* 5.4  HGB 10.3* 10.6* 10.7*  HCT 31.5* 32.6* 33.0*  MCV 88.0 89.1 88.7  PLT 249 278 768   Basic Metabolic Panel: Recent Labs  Lab 06/21/21 0255 06/22/21 0240 06/23/21 0341 06/24/21 0348 06/25/21 0351 06/26/21 0358  NA 140 137 137 138 135 135  K 4.6 4.2 4.0 3.8 3.8 3.7  CL 103 98 97* 97* 96* 96*  CO2 29 30 33* 32 29 30  GLUCOSE 102* 96 134* 154* 157* 153*  BUN 24* 28* 30* 31* 28* 31*  CREATININE 1.00 0.95 0.86 0.84 0.81 0.90  CALCIUM 8.9 8.6* 8.7* 8.9 9.0 8.9  MG 1.7 2.3  --   --   --   --    Liver Function Tests: Recent Labs  Lab 06/20/21 0249 06/23/21 0341 06/24/21 0348 06/25/21 0351 06/26/21 0358  AST 14* 13* _0 ALT _1 ALKPHOS 80 76 82 78 80  BILITOT 0.6 0.2* 0.1* 0.2* 0.4  PROT 6.8 6.7 6.7 6.7 6.7  ALBUMIN 3.1* 3.1* 3.1* 3.1* 3.1*   CBG: Recent Labs  Lab 06/25/21 1618 06/25/21 2212 06/26/21 0800 06/26/21 1103 06/26/21 1603  GLUCAP 152* 146* 153* 110* 146*    Discharge time spent: less than 30 minutes.  Signed: Marylu Lund, MD [facility] 06/26/2021

## 2021-06-26 NOTE — Progress Notes (Signed)
AVS given to patient and explained at the bedside. Medications and follow up appointments have been explained with pt verbalizing understanding.  

## 2021-07-04 ENCOUNTER — Telehealth (HOSPITAL_COMMUNITY): Payer: Self-pay

## 2021-07-04 NOTE — Progress Notes (Incomplete)
HEART & VASCULAR TRANSITION OF CARE CONSULT NOTE     Referring Physician: Dr. Johney Frame  Primary Care: [name], MD Primary Cardiologist: Freada Bergeron, MD   HPI: Referred to clinic by Dr. Johney Frame for heart failure consultation.   76 y/o AAF w/ chronic diastolic heart failure, HCM, HTN, HLD and Type 2DM. Previously lived in [place]. Prior to moving to [facility], she had cardiac care there, which included: Nuclear study in November of 2011 showed an ejection fraction of 62% and normal perfusion. Exercise treadmill February 2014 showed poor exercise capacity and study was technically difficult. However no hypotension noted. Carotid Dopplers January 2014 showed no significant carotid disease. 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 Echo 2/17 vigorous LV function; severe asymmetric LVH, grade 1 DD; severe LAE; findings cw HOCM  She was seen by Dr. [name] Breed in 2018 after moving to [facility] but failed to f/u after initial visit. He arranged for repeat echo 5/18 which showed normal LV size, mild concentric LV hypertrophy with severe focal basal septal hypertrophy. LV outflow gradient (mild) peak 17 mmHg. EF 65-70%, vigorous. No definite systolic anterior motion could be visualized. Mild MR. The mitral valve was heavily calcified, mild mitral stenosis. Mild pulmonary hypertension. Normal RV size and systolic function. Mod-severe LAE.   Had repeat echo 3/22: Severe asymmetric hypertrophy of the basal septum up to 2.4 cm. There is SAM of the MV with LVOT obstruction up to 42-47 mmHG. The MR is central and moderate, rather than posteriorly directed. This is due to significant posterior MAC. LVEF 70-75%. Aortic valve also noted to be thicken, sclerosis w/o stenosis. RVSP was elevated at right ventricular systolic pressure is 59.9 mmHg, with normal RV function.   Recently admitted 3/57 w/ a/c diastolic CHF and pulmonary edema. 2D echo with LVEF 70-75%, severe hypertrophy of the  basal septum, peak gradient through AoV 28mHg, peak LVOT gradient 17, severe PHTN (PASP 78mg) mild-to-mod MS. She was diuresed w/ IV Lasix and transitioned to PO. Placed on Coreg and Cardizem.     Cardiac Testing    Review of Systems: [y] = yes, _0  = no   General: Weight gain _1 ; Weight loss _2 ; Anorexia _3 ; Fatigue _4 ; Fever _5 ; Chills _6 ; Weakness _7   Cardiac: Chest pain/pressure _8 ; Resting SOB _9 ; Exertional SOB _10 ; Orthopnea _11 ; Pedal Edema _12 ; Palpitations _13 ; Syncope _14 ; Presyncope _15 ; Paroxysmal nocturnal dyspnea_16   Pulmonary: Cough _17 ; Wheezing_18 ; Hemoptysis_19 ; Sputum _20 ; Snoring _21   GI: Vomiting_22 ; Dysphagia_23 ; Melena_24 ; Hematochezia _25 ; Heartburn_26 ; Abdominal pain _27 ; Constipation _28 ; Diarrhea _29 ; BRBPR _30   GU: Hematuria_31 ; Dysuria _32 ; Nocturia_33   Vascular: Pain in legs with walking _34 ; Pain in feet with lying flat _35 ; Non-healing sores _36 ; Stroke _37 ; TIA _38 ; Slurred speech _39 ;  Neuro: Headaches_40 ; Vertigo_41 ; Seizures_42 ; Paresthesias_43 ;Blurred vision _44 ; Diplopia _45 ; Vision changes _46   Ortho/Skin: Arthritis _47 ; Joint pain _48 ; Muscle pain _49 ; Joint swelling _50 ; Back Pain _51 ; Rash _52   Psych: Depression_53 ; Anxiety_54   Heme: Bleeding problems _55 ; Clotting disorders _56 ; Anemia _57   Endocrine: Diabetes _58 ; Thyroid dysfunction_59    Past Medical History:  Diagnosis Date   Abnormal MRI, spine 11/2011   ?discitis  L5-S1, s/p CT bx 12/12/11   Allergic rhinitis, cause unspecified 01/31/2013   Arthritis    Asthma    Cervical cancer ([place])    Chronic diastolic heart failure (HCC)    Diabetic retinopathy (HCC)    GERD (gastroesophageal reflux disease)    H/O cardiovascular stress test    Nuclear study in 2011 normal perfusion   HOCM (hypertrophic obstructive cardiomyopathy) ([place])    Echo 12/19/11: Severe LVH, EF 55-65%, dynamic obstruction, wall motion normal, grade 1 diastolic dysfunction, systolic anterior motion of the mitral  valve with mild MS and mild MR, mild LAE, PASP 34   Hyperlipidemia    Hypertension    Kidney stones    Obesity    Type II or unspecified type diabetes mellitus without mention of complication, uncontrolled 01/31/2013    Current Outpatient Medications  Medication Sig Dispense Refill   albuterol (ACCUNEB) 0.63 MG/3ML nebulizer solution INHALE 1 VIAL BY MOUTH VIA NEBULIZATION EVERY 6 HOURS AS NEEDED FOR WHEEZING. (Patient taking differently: Take 1 ampule by nebulization every 6 (six) hours as needed for wheezing or shortness of breath.) 75 mL 12   albuterol (PROAIR HFA) 108 (90 Base) MCG/ACT inhaler Inhale 2 puffs into the lungs 2 (two) times daily. (Patient taking differently: Inhale 2 puffs into the lungs 2 (two) times daily. May repeat if needed) 8.5 Inhaler 2   allopurinol (ZYLOPRIM) 100 MG tablet Take 1 tablet (100 mg total) by mouth daily. 90 tablet 3   ammonium lactate (AMLACTIN) 12 % cream APPLY TOPICALLY AS NEEDED FOR DRY SKIN. 385 g 0   aspirin 81 MG tablet Take 162 mg by mouth daily.      atorvastatin (LIPITOR) 80 MG tablet Take 1 tablet (80 mg total) by mouth daily. 90 tablet 3   Blood Glucose Monitoring Suppl (ONE TOUCH ULTRA 2) w/Device KIT Use as directed three times daily E11.9 1 kit 0   carvedilol (COREG) 25 MG tablet Take 1 tablet (25 mg total) by mouth 2 (two) times daily with a meal. 60 tablet 0   cetirizine (ZYRTEC) 10 MG tablet TAKE 1 TABLET BY MOUTH EVERY DAY AS NEEDED (Patient taking differently: Take 10 mg by mouth daily.) 90 tablet 1   Cholecalciferol (D3 ADULT PO) Take 1 tablet by mouth daily.     Continuous Blood Gluc Receiver (FREESTYLE LIBRE READER) DEVI Apply 1 Device topically 4 (four) times daily as needed. E11.9 1 each 0   Continuous Blood Gluc Sensor (FREESTYLE LIBRE 14 DAY SENSOR) MISC Apply 1 Device topically every 14 (fourteen) days. E11.9 6 each 3   diltiazem (CARDIZEM CD) 180 MG 24 hr capsule Take 1 capsule (180 mg total) by mouth daily. 30 capsule 0    DULoxetine (CYMBALTA) 60 MG capsule Take 1 capsule (60 mg total) by mouth daily. 90 capsule 3   furosemide (LASIX) 40 MG tablet Take 1 tablet (40 mg total) by mouth 2 (two) times daily. 60 tablet 0   glipiZIDE (GLUCOTROL XL) 2.5 MG 24 hr tablet Take 1 tablet (2.5 mg total) by mouth daily with breakfast. 90 tablet 2   glucose blood (ONETOUCH ULTRA) test strip Use as instructed three times daily E11.9 300 each 12   Lancets MISC Use as directed three times daily E11.9 300 each 3   meclizine (ANTIVERT) 25 MG tablet TAKE 1 TABLET BY MOUTH THREE TIMES A DAY as needed (Patient taking differently: Take 25 mg by mouth 3 (three) times daily as needed for dizziness or nausea.) 30 tablet 5  Multiple Vitamin (MULTIVITAMIN ADULT PO) Take 1 tablet by mouth daily.     pantoprazole (PROTONIX) 40 MG tablet TAKE 1 TABLET BY MOUTH EVERY DAY (Patient taking differently: Take 40 mg by mouth daily.) 90 tablet 3   triamcinolone (NASACORT AQ) 55 MCG/ACT AERO nasal inhaler Place 2 sprays into the nose daily. (Patient taking differently: Place 2 sprays into the nose daily as needed (rhinitis).) 3 Inhaler 3   No current facility-administered medications for this visit.    Allergies  Allergen Reactions   Sulfa Antibiotics Hives   Metformin And Related Other (See Comments)    intolerance   Other Hives    Unknown drug   Prednisone Swelling      Social History   Socioeconomic History   Marital status: Widowed    Spouse name: Not on file   Number of children: 5   Years of education: 14   Highest education level: Not on file  Occupational History   Occupation: Retired Optometrist  Tobacco Use   Smoking status: Never   Smokeless tobacco: Never  Substance and Sexual Activity   Alcohol use: No    Alcohol/week: 0.0 standard drinks   Drug use: No   Sexual activity: Not on file  Other Topics Concern   Not on file  Social History Narrative   Not on file   Social Determinants of Health   Financial Resource  Strain: Not on file  Food Insecurity: Not on file  Transportation Needs: Not on file  Physical Activity: Not on file  Stress: Not on file  Social Connections: Not on file  Intimate Partner Violence: Not on file      Family History  Problem Relation Age of Onset   Heart disease Father    Arthritis Other    Heart disease Other    Hypertension Other    Diabetes Other    Alcohol abuse Other    Arthritis Other    Cancer Other        Lung Cancer   Heart disease Other    Hypertension Other    Sudden death Other    Kidney disease Other    Mental illness Other    Diabetes Other    Colon cancer Neg Hx     There were no vitals filed for this visit.  PHYSICAL EXAM: General:  Well appearing. No respiratory difficulty HEENT: normal Neck: supple. no JVD. Carotids 2+ bilat; no bruits. No lymphadenopathy or thryomegaly appreciated. Cor: PMI nondisplaced. Regular rate & rhythm. No rubs, gallops or murmurs. Lungs: clear Abdomen: soft, nontender, nondistended. No hepatosplenomegaly. No bruits or masses. Good bowel sounds. Extremities: no cyanosis, clubbing, rash, edema Neuro: alert & oriented x 3, cranial nerves grossly intact. moves all 4 extremities w/o difficulty. Affect pleasant.  ECG:   ASSESSMENT & PLAN:  1. Chronic Diastolic Heart Failure w/ HCM Physiology  - most recent echo 2/23: LVEF 70-75%, severe hypertrophy of the basal septum, peak gradient through AoV 35mHg, peak LVOT gradient 17 - w/ thicken valves, severe BAE,  h/o conduction blocks and spinal stenosis, will arrange for PYP scan to screen for TTR amyloid  - set up for RHC ? SGLT2i Jardiance 10 mg daily   2. Pulmonary HTN  - per review of previous echos, this is long standing and progressive - most recent echo 2/23 w/ severe PHTN (PASP 730mg), RV ok  - suspect primarily WHO Group 2 in setting of left sided heart disease  - refer for RHC   3.  Mitral Stenosis/Regurgitation  - degenerative, calcified valve on  TEE.  Mild MR. MS reported as mild-mod but Gradient of 9 mmHg more c/w moderate. MVA 2.3 cm2 - given severity of PH and severe BAE, ? If gradients underestimated. May need TEE for better assessment   4. Systemic Hypertension   5. Type 2DM  - recent Hgb A1c 6.1  - add Jardiance 10 mg      NYHA *** GDMT  Diuretic- BB- Ace/ARB/ARNI MRA SGLT2i    Referred to HFSW (PCP, Medications, Transportation, ETOH Abuse, Drug Abuse, Insurance, Financial ): Yes or No Refer to Pharmacy: Yes or No Refer to Home Health: Yes on No Refer to Advanced Heart Failure Clinic: Yes or no  Refer to General Cardiology: Yes or No  Follow up

## 2021-07-04 NOTE — Telephone Encounter (Signed)
Addendum:   Pt returned call. Pt states she was not aware of HV TOC clinic appt 2/21 @ 9AM. Pt does not have transportation as she does not drive d/t poor eyesight. Offered 3pm appt, pt unsure if she will have transportation.   Will call tomorrow morning to see if she is able to attend HV TOC 2/21 @ 3pm.   Pricilla Holm, MSN, RN Heart Failure Nurse Navigator [phone]

## 2021-07-04 NOTE — Telephone Encounter (Signed)
Heart Failure Nurse Navigator Progress Note  Call attempted to confirm HV TOC appt tomorrow 2/21 @ 9AM. No answer on numbers listed in chart, no opportunity to leave vm.  Pricilla Holm, MSN, RN Heart Failure Nurse Navigator [phone]

## 2021-07-05 ENCOUNTER — Telehealth (HOSPITAL_COMMUNITY): Payer: Self-pay

## 2021-07-05 ENCOUNTER — Encounter (HOSPITAL_COMMUNITY): Payer: Medicare Other

## 2021-07-05 NOTE — Telephone Encounter (Signed)
Heart Failure Nurse Navigator Progress Note  Attempted to contact patient after yesterday's phone call regarding changing appointment time if she was able to acquire transportation from family member. No answer, no opportunity to leave voice mail.   Pricilla Holm, MSN, RN Heart Failure Nurse Navigator [phone]

## 2021-07-07 ENCOUNTER — Encounter: Payer: Medicare Other | Admitting: Internal Medicine

## 2021-07-12 ENCOUNTER — Ambulatory Visit: Payer: Medicare Other | Admitting: Family Medicine

## 2021-07-12 ENCOUNTER — Inpatient Hospital Stay: Payer: Medicare Other | Admitting: Internal Medicine

## 2021-07-12 ENCOUNTER — Telehealth: Payer: Self-pay

## 2021-07-12 NOTE — Progress Notes (Unsigned)
° °  I, Peterson Lombard, LAT, ATC acting as a scribe for [name], MD.  Alyssa Ingram is a 76 y.o. female who presents to [place] at [facility] today for f/u R shoulder and bilat knee. Pt was last seen by Dr. Georgina Snell on 01/18/21 and was given a R GH and L knee steroid injections and was advised to cont HEP. Today, pt reports  Dx imaging: 06/19/21 R shoulder XR 08/06/14 R & L knee XR  Pertinent review of systems: ***  Relevant historical information: ***   Exam:  There were no vitals taken for this visit. General: Well Developed, well nourished, and in no acute distress.   MSK: ***    Lab and Radiology Results No results found for this or any previous visit (from the past 72 hour(s)). No results found.     Assessment and Plan: 76 y.o. female with ***   PDMP not reviewed this encounter. No orders of the defined types were placed in this encounter.  No orders of the defined types were placed in this encounter.    Discussed warning signs or symptoms. Please see discharge instructions. Patient expresses understanding.   ***

## 2021-07-12 NOTE — Telephone Encounter (Signed)
Pt son calling in to verify appt time and date. It was scheduled for 2/28.   [name] [address] [name]  PT son [phone]

## 2021-07-12 NOTE — Telephone Encounter (Incomplete Revision)
Pt son calling in to verify appt time and date. It was scheduled for 2/28 today. Pt son is concerned about the commute that pt will have to travel for son funeral.  [place], [place] [address] [name]   Pt was hospitalized for Acute on chronic heart failure.   Pt rescheduled the appt for Friday 3/3 @11    Pt CB [phone]

## 2021-07-12 NOTE — Telephone Encounter (Incomplete Revision)
Pt son calling in to verify appt time and date. It was scheduled for 2/28 today. Pt son is concerned about the commute that pt will have to travel for son funeral.  [place], [place] [address] [name]  Pt was hospitalized for Acute on chronic heart failure.  Pt rescheduled the appt for Friday 3/3 @11   Pt CB [phone]

## 2021-07-13 NOTE — Telephone Encounter (Signed)
Pt's son checking status of provider's recommendations ? ?Informed caller of provider's recommendations noted below, caller verbalized  understanding ?

## 2021-07-13 NOTE — Telephone Encounter (Signed)
Pt son is calling back to see what Dr. Jenny Reichmann recommendation for traveling for pt son funeral. They are wanting to make arrangements and are at a stand still without this recommendation. ?[name], [name] [address] ?[name] [address] ? ?Due to the recent Hospitalization for Acute Chronic Heart Failure. ? ? ?Pt son Alyssa Ingram [phone] ?

## 2021-07-13 NOTE — Telephone Encounter (Signed)
Patient's son has been notified.  ? ?Patient's son [name], inquiring again about travel. Patient's brother died 08/09/2022 and now her other son died yesterday. They are trying to make funeral arrangements and need to know if patient is okay to travel prior to them setting a date for memorial services.  ?

## 2021-07-13 NOTE — Telephone Encounter (Signed)
Needs OV as recommended at time of last dscharge feb 12 ?

## 2021-07-15 ENCOUNTER — Ambulatory Visit (INDEPENDENT_AMBULATORY_CARE_PROVIDER_SITE_OTHER): Payer: Medicare Other | Admitting: Internal Medicine

## 2021-07-15 ENCOUNTER — Ambulatory Visit: Payer: Medicare Other | Admitting: Family Medicine

## 2021-07-15 ENCOUNTER — Other Ambulatory Visit: Payer: Self-pay

## 2021-07-15 VITALS — BP 118/60 | HR 70 | Temp 97.8°F | Ht 60.0 in | Wt 170.0 lb

## 2021-07-15 DIAGNOSIS — Z7184 Encounter for health counseling related to travel: Secondary | ICD-10-CM

## 2021-07-15 DIAGNOSIS — I509 Heart failure, unspecified: Secondary | ICD-10-CM

## 2021-07-15 DIAGNOSIS — I10 Essential (primary) hypertension: Secondary | ICD-10-CM | POA: Diagnosis not present

## 2021-07-15 DIAGNOSIS — E559 Vitamin D deficiency, unspecified: Secondary | ICD-10-CM | POA: Diagnosis not present

## 2021-07-15 DIAGNOSIS — H34231 Retinal artery branch occlusion, right eye: Secondary | ICD-10-CM

## 2021-07-15 LAB — BASIC METABOLIC PANEL
BUN: 31 mg/dL — ABNORMAL HIGH (ref 6–23)
CO2: 27 mEq/L (ref 19–32)
Calcium: 10.3 mg/dL (ref 8.4–10.5)
Chloride: 102 mEq/L (ref 96–112)
Creatinine, Ser: 1.06 mg/dL (ref 0.40–1.20)
GFR: 51.26 mL/min — ABNORMAL LOW (ref >60.00)
Glucose, Bld: 153 mg/dL — ABNORMAL HIGH (ref 70–99)
Potassium: 3.4 mEq/L — ABNORMAL LOW (ref 3.5–5.1)
Sodium: 141 mEq/L (ref 135–145)

## 2021-07-15 LAB — BRAIN NATRIURETIC PEPTIDE: Pro B Natriuretic peptide (BNP): 154 pg/mL — ABNORMAL HIGH (ref 0.0–100.0)

## 2021-07-15 MED ORDER — FUROSEMIDE 40 MG PO TABS: 40.0000 mg | ORAL_TABLET | Freq: Two times a day (BID) | ORAL | 11 refills | Status: AC

## 2021-07-15 MED ORDER — CARVEDILOL 25 MG PO TABS: 25.0000 mg | ORAL_TABLET | Freq: Two times a day (BID) | ORAL | 11 refills | Status: AC

## 2021-07-15 NOTE — Progress Notes (Signed)
Patient ID: Alyssa Ingram, female   DOB: 11-Oct-1945, 76 y.o.   MRN: [id number]        Chief Complaint: follow up chf       HPI:  Alyssa Ingram is a 76 y.o. female here with son post hospn with CHF ob lasix 40 bid with wt up several lbs but Pt denies chest pain, increased sob or doe, wheezing, orthopnea, PND, increased LE swelling, palpitations, dizziness or syncope.   Son died of covid complications in [place] 3 days ago.  Son with her asking bout risk of travel; there is option for [employer], service in [facility] or even a service in [place].    Pt denies polydipsia, polyuria, or new focal neuro s/s.   Pt denies fever, wt loss, night sweats, loss of appetite, or other constitutional symptoms         Wt Readings from Last 3 Encounters:  07/15/21 170 lb (77.1 kg)  06/26/21 166 lb 3.6 oz (75.4 kg)  01/18/21 189 lb 9.6 oz (86 kg)   BP Readings from Last 3 Encounters:  07/15/21 118/60  06/26/21 110/65  01/18/21 118/72         Past Medical History:  Diagnosis Date   Abnormal MRI, spine 11/2011   ?discitis L5-S1, s/p CT bx 12/12/11   Allergic rhinitis, cause unspecified 01/31/2013   Arthritis    Asthma    Cervical cancer ([place])    Chronic diastolic heart failure (HCC)    Diabetic retinopathy (HCC)    GERD (gastroesophageal reflux disease)    H/O cardiovascular stress test    Nuclear study in 2011 normal perfusion   HOCM (hypertrophic obstructive cardiomyopathy) ([place])    Echo 12/19/11: Severe LVH, EF 55-65%, dynamic obstruction, wall motion normal, grade 1 diastolic dysfunction, systolic anterior motion of the mitral valve with mild MS and mild MR, mild LAE, PASP 34   Hyperlipidemia    Hypertension    Kidney stones    Obesity    Type II or unspecified type diabetes mellitus without mention of complication, uncontrolled 01/31/2013   Past Surgical History:  Procedure Laterality Date   ABDOMINAL HYSTERECTOMY     JOINT REPLACEMENT     right knee 2006   rotater cuff     right, 2005     reports that she has never smoked. She has never used smokeless tobacco. She reports that she does not drink alcohol and does not use drugs. family history includes Alcohol abuse in an other family member; Arthritis in some other family members; Cancer in an other family member; Diabetes in some other family members; Heart disease in her father and other family members; Hypertension in some other family members; Kidney disease in an other family member; Mental illness in an other family member; Sudden death in an other family member. Allergies  Allergen Reactions   Sulfa Antibiotics Hives   Metformin And Related Other (See Comments)    intolerance   Other Hives    Unknown drug   Prednisone Swelling   Current Outpatient Medications on File Prior to Visit  Medication Sig Dispense Refill   albuterol (ACCUNEB) 0.63 MG/3ML nebulizer solution INHALE 1 VIAL BY MOUTH VIA NEBULIZATION EVERY 6 HOURS AS NEEDED FOR WHEEZING. (Patient taking differently: Take 1 ampule by nebulization every 6 (six) hours as needed for wheezing or shortness of breath.) 75 mL 12   albuterol (PROAIR HFA) 108 (90 Base) MCG/ACT inhaler Inhale 2 puffs into the lungs 2 (two) times daily. (Patient  taking differently: Inhale 2 puffs into the lungs 2 (two) times daily. May repeat if needed) 8.5 Inhaler 2   allopurinol (ZYLOPRIM) 100 MG tablet Take 1 tablet (100 mg total) by mouth daily. 90 tablet 3   ammonium lactate (AMLACTIN) 12 % cream APPLY TOPICALLY AS NEEDED FOR DRY SKIN. 385 g 0   aspirin 81 MG tablet Take 162 mg by mouth daily.      atorvastatin (LIPITOR) 80 MG tablet Take 1 tablet (80 mg total) by mouth daily. 90 tablet 3   Blood Glucose Monitoring Suppl (ONE TOUCH ULTRA 2) w/Device KIT Use as directed three times daily E11.9 1 kit 0   cetirizine (ZYRTEC) 10 MG tablet TAKE 1 TABLET BY MOUTH EVERY DAY AS NEEDED (Patient taking differently: Take 10 mg by mouth daily.) 90 tablet 1   Cholecalciferol (D3 ADULT PO) Take 1  tablet by mouth daily.     Continuous Blood Gluc Receiver (FREESTYLE LIBRE READER) DEVI Apply 1 Device topically 4 (four) times daily as needed. E11.9 1 each 0   Continuous Blood Gluc Sensor (FREESTYLE LIBRE 14 DAY SENSOR) MISC Apply 1 Device topically every 14 (fourteen) days. E11.9 6 each 3   diltiazem (CARDIZEM CD) 180 MG 24 hr capsule Take 1 capsule (180 mg total) by mouth daily. 30 capsule 0   DULoxetine (CYMBALTA) 60 MG capsule Take 1 capsule (60 mg total) by mouth daily. 90 capsule 3   glipiZIDE (GLUCOTROL XL) 2.5 MG 24 hr tablet Take 1 tablet (2.5 mg total) by mouth daily with breakfast. 90 tablet 2   glucose blood (ONETOUCH ULTRA) test strip Use as instructed three times daily E11.9 300 each 12   Lancets MISC Use as directed three times daily E11.9 300 each 3   meclizine (ANTIVERT) 25 MG tablet TAKE 1 TABLET BY MOUTH THREE TIMES A DAY as needed (Patient taking differently: Take 25 mg by mouth 3 (three) times daily as needed for dizziness or nausea.) 30 tablet 5   Multiple Vitamin (MULTIVITAMIN ADULT PO) Take 1 tablet by mouth daily.     pantoprazole (PROTONIX) 40 MG tablet TAKE 1 TABLET BY MOUTH EVERY DAY (Patient taking differently: Take 40 mg by mouth daily.) 90 tablet 3   triamcinolone (NASACORT AQ) 55 MCG/ACT AERO nasal inhaler Place 2 sprays into the nose daily. (Patient taking differently: Place 2 sprays into the nose daily as needed (rhinitis).) 3 Inhaler 3   No current facility-administered medications on file prior to visit.        ROS:  All others reviewed and negative.  Objective        PE:  BP 118/60    Pulse 70    Temp 97.8 F (36.6 C) (Oral)    Ht 5' (1.524 m)    Wt 170 lb (77.1 kg)    SpO2 94%    BMI 33.20 kg/m                 Constitutional: Pt appears in NAD               HENT: Head: NCAT.                Right Ear: External ear normal.                 Left Ear: External ear normal.                Eyes: . Pupils are equal, round, and reactive to light. Conjunctivae  and EOM are  normal               Nose: without d/c or deformity               Neck: Neck supple. Gross normal ROM               Cardiovascular: Normal rate and regular rhythm.                 Pulmonary/Chest: Effort normal and breath sounds without rales or wheezing.                Abd:  Soft, NT, ND, + BS, no organomegaly               Neurological: Pt is alert. At baseline orientation, motor grossly intact               Skin: Skin is warm. No rashes, no other new lesions, LE edema - none               Psychiatric: Pt behavior is normal without agitation   Micro: none  Cardiac tracings I have personally interpreted today:  none  Pertinent Radiological findings (summarize): none   Lab Results  Component Value Date   WBC 8.2 06/22/2021   HGB 10.7 (L) 06/22/2021   HCT 33.0 (L) 06/22/2021   PLT 259 06/22/2021   GLUCOSE 153 (H) 06/26/2021   CHOL 191 01/18/2021   TRIG 98.0 01/18/2021   HDL 57.00 01/18/2021   LDLCALC 115 (H) 01/18/2021   ALT 15 06/26/2021   AST 18 06/26/2021   NA 135 06/26/2021   K 3.7 06/26/2021   CL 96 (L) 06/26/2021   CREATININE 0.90 06/26/2021   BUN 31 (H) 06/26/2021   CO2 30 06/26/2021   TSH 0.49 09/28/2020   INR 1.03 12/12/2011   HGBA1C 6.1 (H) 06/19/2021   MICROALBUR 3.9 (H) 09/28/2020   Assessment/Plan:  BREUNA LOVEALL is a 76 y.o. Black or African American [2] female with  has a past medical history of Abnormal MRI, spine (11/2011), Allergic rhinitis, cause unspecified (01/31/2013), Arthritis, Asthma, Cervical cancer ([place]), Chronic diastolic heart failure ([place]), Diabetic retinopathy ([place]), GERD (gastroesophageal reflux disease), H/O cardiovascular stress test, HOCM (hypertrophic obstructive cardiomyopathy) ([place]), Hyperlipidemia, Hypertension, Kidney stones, Obesity, and Type II or unspecified type diabetes mellitus without mention of complication, uncontrolled (01/31/2013).  CHF (congestive heart failure) (HCC) overall stable except for few lbs wt  increased; will cont to monitor dialy and let us know her wt mid next wk for furher consderation, o/w cont current med tx including lasix  Travel advice encounter After d/w son, it seems best option would be for service in [place], second option would be in [facility] but I would advise against car or plane to [place] until more stable  Hypertension BP Readings from Last 3 Encounters:  07/15/21 118/60  06/26/21 110/65  01/18/21 118/72   Stable, pt to continue medical treatment coreg cardizem   Vitamin D deficiency Last vitamin D Lab Results  Component Value Date   VD25OH 28.53 (L) 09/28/2020   Low, reminded to take oral replacement  Followup: Return in about 3 months (around 10/15/2021).  Cathlean Cower, MD 07/17/2021 1:43 PM [facility]

## 2021-07-15 NOTE — Patient Instructions (Signed)
Ok to continue your current lasix as is ? ?Please call about tues or wed next week with your weight again ? ?Please continue all other medications as before, and refills have been done if requested. ? ?Please have the pharmacy call with any other refills you may need. ? ?Please continue your efforts at being more active, low cholesterol diet ? ?Please keep your appointments with your specialists as you may have planned ? ?Please go to the LAB at the blood drawing area for the tests to be done ? ?You will be contacted by phone if any changes need to be made immediately.  Otherwise, you will receive a letter about your results with an explanation, but please check with MyChart first. ? ?Please make an Appointment to return in 3 months, or sooner if needed ? ? ?

## 2021-07-15 NOTE — Progress Notes (Deleted)
? ?  I, Peterson Lombard, LAT, ATC acting as a scribe for [name], MD. ? ?Alyssa Ingram is a 76 y.o. female who presents to [place] at [facility] today for continued R shoulder and L knee pain. Pt was previously seen by Dr. Georgina Snell on 01/18/21 ? ?Dx imaging: 08/06/14 R & L knee XR ? ?Pertinent review of systems: *** ? ?Relevant historical information: *** ? ? ?Exam:  ?There were no vitals taken for this visit. ?General: Well Developed, well nourished, and in no acute distress.  ? ?MSK: *** ? ? ? ?Lab and Radiology Results ?No results found for this or any previous visit (from the past 72 hour(s)). ?No results found. ? ? ? ? ?Assessment and Plan: ?76 y.o. female with *** ? ? ?PDMP not reviewed this encounter. ?No orders of the defined types were placed in this encounter. ? ?No orders of the defined types were placed in this encounter. ? ? ? ?Discussed warning signs or symptoms. Please see discharge instructions. Patient expresses understanding. ? ? ?*** ? ?

## 2021-07-17 ENCOUNTER — Encounter: Payer: Self-pay | Admitting: Internal Medicine

## 2021-07-17 DIAGNOSIS — Z7184 Encounter for health counseling related to travel: Secondary | ICD-10-CM | POA: Insufficient documentation

## 2021-07-17 NOTE — Assessment & Plan Note (Signed)
After d/w son, it seems best option would be for service in [place], second option would be in [facility] but I would advise against car or plane to [place] until more stable ?

## 2021-07-17 NOTE — Assessment & Plan Note (Addendum)
overall stable except for few lbs wt increased; will cont to monitor dialy and let us know her wt mid next wk for furher consderation, o/w cont current med tx including lasix; also for bmp today ?

## 2021-07-17 NOTE — Assessment & Plan Note (Signed)
BP Readings from Last 3 Encounters:  ?07/15/21 118/60  ?06/26/21 110/65  ?01/18/21 118/72  ? ?Stable, pt to continue medical treatment coreg cardizem ? ?

## 2021-07-17 NOTE — Assessment & Plan Note (Signed)
Last vitamin D ?Lab Results  ?Component Value Date  ? VD25OH 28.53 (L) 09/28/2020  ? ?Low, reminded to take oral replacement ? ?

## 2021-07-18 ENCOUNTER — Ambulatory Visit: Payer: Self-pay

## 2021-07-18 ENCOUNTER — Other Ambulatory Visit: Payer: Self-pay

## 2021-07-18 ENCOUNTER — Ambulatory Visit (INDEPENDENT_AMBULATORY_CARE_PROVIDER_SITE_OTHER): Payer: Medicare Other

## 2021-07-18 ENCOUNTER — Ambulatory Visit (INDEPENDENT_AMBULATORY_CARE_PROVIDER_SITE_OTHER): Payer: Medicare Other | Admitting: Family Medicine

## 2021-07-18 ENCOUNTER — Telehealth: Payer: Self-pay | Admitting: Internal Medicine

## 2021-07-18 VITALS — BP 146/88 | HR 58 | Ht 60.0 in | Wt 171.8 lb

## 2021-07-18 DIAGNOSIS — I509 Heart failure, unspecified: Secondary | ICD-10-CM | POA: Diagnosis not present

## 2021-07-18 DIAGNOSIS — M25511 Pain in right shoulder: Secondary | ICD-10-CM | POA: Diagnosis not present

## 2021-07-18 DIAGNOSIS — M25562 Pain in left knee: Secondary | ICD-10-CM

## 2021-07-18 DIAGNOSIS — R5381 Other malaise: Secondary | ICD-10-CM

## 2021-07-18 DIAGNOSIS — G8929 Other chronic pain: Secondary | ICD-10-CM | POA: Diagnosis not present

## 2021-07-18 DIAGNOSIS — M25561 Pain in right knee: Secondary | ICD-10-CM | POA: Diagnosis not present

## 2021-07-18 MED ORDER — POTASSIUM CHLORIDE ER 10 MEQ PO TBCR: 10.0000 meq | EXTENDED_RELEASE_TABLET | Freq: Every day | ORAL | 3 refills | Status: DC

## 2021-07-18 NOTE — Progress Notes (Signed)
? ?I, Alyssa Ingram, LAT, ATC, am serving as scribe for Dr. Lynne Ingram. ? ?Alyssa Ingram is a 76 y.o. female who presents to [place] at [facility] today for f/u of B knee and R shoulder pain.  She was last seen by Dr. Georgina Snell on 01/18/21 and had a R GHJ and L knee steroid injection.  Today, pt reports L knee is very painful and locates pain to the anterior aspect. Pt also c/o R shoulder pain and decreased AROM. Pt locates pain to all over the R [facility] w/ radiating pain to the R elbow. Pt's son recalls a fall about 2 months ago when she fell off a dining room chair, landing on her R side.  ? ?Since she was last seen in my office and September 2022 she has been hospitalized twice for heart failure.  Additionally her son and brother-in-law recently died.  She is planning on traveling to [place] tomorrow to attend a funeral. ? ?She reports that home health physical therapy was originally attempted to be ordered after her discharge from the hospital in February but as far she knows she was never contacted. ? ? ?Dx imaging: 06/19/21 R shoulder XR ?08/06/14 R & L knee XR ? ?Pertinent review of systems: No fevers or chills.  Positive for shortness of breath. ? ?Relevant historical information: Heart failure.  Kidney injury.  Hyperglycemia. ?Right total knee replacement. ? ? ?Exam:  ?BP (!) 146/88   Pulse (!) 58   Ht 5' (1.524 m)   Wt 171 lb 12.8 oz (77.9 kg)   SpO2 99%   BMI 33.55 kg/m?  ?General: Well Developed, well nourished, and in no acute distress.  ? ?MSK:  ?Right shoulder: Decreased muscle bulk. ?Very limited active range of motion abduction 30 degrees beyond neutral position.  External rotation 20 degrees.  Functional internal rotation to lateral hip. ?Strength limited 3/5 abduction and external rotation. ?4/5 internal rotation. ?Elbow flexion is intact. ?Pulses cap refill and sensation are intact distally. ? ?Left knee decreased range of motion with crepitation.  Tender to palpation  medial joint line. ? ?Walking using a rolling walker to ambulate. ? ?Significant lower extremity edema bilaterally. ? ? ?Lab and Radiology Results ? ?Procedure: Real-time Ultrasound Guided Injection of right shoulder glenohumeral joint posterior approach ?Device: Philips Affiniti 50G ?Images permanently stored and available for review in PACS ?Verbal informed consent obtained.  Discussed risks and benefits of procedure. Warned about infection bleeding damage to structures skin hypopigmentation and fat atrophy among others. ?Patient expresses understanding and agreement ?Time-out conducted.   ?Noted no overlying erythema, induration, or other signs of local infection.   ?Skin prepped in a sterile fashion.   ?Local anesthesia: Topical Ethyl chloride.   ?With sterile technique and under real time ultrasound guidance: 40 mg of Kenalog and 2 mL of Marcaine injected into right shoulder joint. Fluid seen entering the joint capsule.   ?Completed without difficulty   ?Pain immediately resolved suggesting accurate placement of the medication.   ?Advised to call if fevers/chills, erythema, induration, drainage, or persistent bleeding.   ?Images permanently stored and available for review in the ultrasound unit.  ?Impression: Technically successful ultrasound guided injection. ? ? ?EXAM: ?RIGHT SHOULDER - 1 VIEW ?  ?COMPARISON:  Chest radiograph-earlier same day ?  ?FINDINGS: ?Examination is degraded due to patient body habitus. ?  ?No definite fracture or dislocation. Severe degenerative change of ?the right glenohumeral joint with joint space loss, subchondral ?sclerosis and osteophytosis. Moderate degenerative  change of the ?right AC joint with joint space loss, subchondral sclerosis and ?inferiorly directed osteophytosis. Erosions are noted about the ?rotator cuff insertion site. No evidence of calcific tendinitis. ?Limited visualization of the adjacent thorax demonstrates enlarged ?cardiac silhouette with indistinct  pulmonary vasculature, ?potentially accentuated due to obliquity and technique. ?  ?IMPRESSION: ?1. No acute findings. ?2. Severe degenerative change of the glenohumeral joint, potentially ?degenerative in etiology though could be seen in the setting of an ?inflammatory arthropathy such as rheumatoid arthritis. Clinical ?correlation is advised. ?3. Moderate degenerative change of the right AC joint. ?4. Suspected cardiomegaly and pulmonary edema, incompletely ?evaluated. ?  ?  ?Electronically Signed ?  By: Sandi Mariscal M.D. ?  On: 06/19/2021 09:43 ?  ?I, Alyssa Ingram, personally (independently) visualized and performed the interpretation of the images attached in this note. ? ?X-ray images bilateral knees obtained today personally and independently interpreted ? ?Right knee: Intact hardware from total knee replacement.  No fractures or obvious signs of loosening. ? 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 ? ?Await formal radiology review ? ? ? ?  Chemistry   ?   ?Component Value Date/Time  ? NA 135 06/26/2021 0358  ? K 3.7 06/26/2021 0358  ? CL 96 (L) 06/26/2021 0358  ? CO2 30 06/26/2021 0358  ? BUN 31 (H) 06/26/2021 0358  ? CREATININE 0.90 06/26/2021 0358  ?    ?Component Value Date/Time  ? CALCIUM 8.9 06/26/2021 0358  ? ALKPHOS 80 06/26/2021 0358  ? AST 18 06/26/2021 0358  ? ALT 15 06/26/2021 0358  ? BILITOT 0.4 06/26/2021 0358  ?  ? ? ? ?Assessment and Plan: ?76 y.o. 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  She is not a good surgical candidate for rotator cuff repair or total shoulder replacement given her other health conditions.  We will try to treat conservatively with glenohumeral injection and formal physical therapy. ? ?Knee pain left worse than right.  Thought to be due to DJD.  Plan for home health PT.  Return in the near future for steroid injection when  needed.  Would like to minimize how many steroid injections she gets in 1 visit.  I think more than 1 injection during each visit will put her at significant risk for hyperglycemia and even fluid overload. ? ? ?PDMP not reviewed this encounter. ?Orders Placed This Encounter  ?Procedures  ? [place] LIMITED JOINT SPACE STRUCTURES UP RIGHT(NO LINKED CHARGES)  ?  Order Specific Question:   Reason for Exam (SYMPTOM  OR DIAGNOSIS REQUIRED)  ?  Answer:   right shoulder pain  ?  Order Specific Question:   Preferred imaging location?  ?  Answer:   [facility]  ? DG Knee AP/LAT W/[place] Left  ?  Standing Status:   Future  ?  Number of Occurrences:   1  ?  Standing Expiration Date:   08/18/2021  ?  Order Specific Question:   Reason for Exam (SYMPTOM  OR DIAGNOSIS REQUIRED)  ?  Answer:   bilateral knee pain  ?  Order Specific Question:   Preferred imaging location?  ?  Answer:   [facility]  ? DG Knee AP/LAT W/[place] Right  ?  Standing Status:   Future  ?  Number of Occurrences:   1  ?  Standing Expiration Date:  08/18/2021  ?  Order Specific Question:   Reason for Exam (SYMPTOM  OR DIAGNOSIS REQUIRED)  ?  Answer:   bilateral knee pain  ?  Order Specific Question:   Preferred imaging location?  ?  Answer:   [facility]  ? Ambulatory referral to Home Health  ?  Referral Priority:   Routine  ?  Referral Type:   Home Health Care  ?  Referral Reason:   Specialty Services Required  ?  Requested Specialty:   [place]  ?  Number of Visits Requested:   1  ? ?No orders of the defined types were placed in this encounter. ? ? ? ?Discussed warning signs or symptoms. Please see discharge instructions. Patient expresses understanding. ? ? ?The above documentation has been reviewed and is accurate and complete Alyssa Ingram, M.D. ? ? ?

## 2021-07-18 NOTE — Patient Instructions (Addendum)
Thank you for coming in today.  ? ?Please get an Xray today before you leave  ? ?You received a steroid injection in your right shoulder today. Seek immediate medical attention if the joint becomes red, extremely painful, or is oozing fluid.  ? ?I've placed an order for home health physical therapy to come out to your home. If you don't hear from them about scheduling within 1 week, please let me know. ? ?Recheck back as needed ?

## 2021-07-19 NOTE — Progress Notes (Signed)
Left knee x-ray shows severe arthritis changes.

## 2021-07-19 NOTE — Progress Notes (Signed)
Right knee x-ray shows good-looking hardware from total knee replacement.

## 2021-07-26 NOTE — Telephone Encounter (Signed)
1.Medication Requested: diltiazem (CARDIZEM CD) 180 MG 24 hr capsule ?Continuous Blood Gluc Receiver (FREESTYLE LIBRE READER) DEVI ?Continuous Blood Gluc Sensor (FREESTYLE LIBRE 14 DAY SENSOR) MISC ? ? ?2. Pharmacy (Name, [place]): [id number] [place] [place] ?Phone:  [phone]?Fax:  [phone]? ? ?3. On Med List: yes ? ?4. Last Visit with PCP: 03.03.23 ? ?5. Next visit date with PCP:n/a ? ? ?Agent: Please be advised that RX refills may take up to 3 business days. We ask that you follow-up with your pharmacy.  ?

## 2021-07-28 ENCOUNTER — Telehealth: Payer: Self-pay | Admitting: Internal Medicine

## 2021-07-28 MED ORDER — FREESTYLE LIBRE READER DEVI: 1.0000 | Freq: Four times a day (QID) | 0 refills | Status: DC | PRN

## 2021-07-28 MED ORDER — FREESTYLE LIBRE 14 DAY SENSOR MISC: 1.0000 | 3 refills | Status: DC

## 2021-07-28 MED ORDER — GLIPIZIDE ER 2.5 MG PO TB24: 2.5000 mg | ORAL_TABLET | Freq: Every day | ORAL | 3 refills | Status: AC

## 2021-07-28 NOTE — Telephone Encounter (Signed)
1.Medication Requested: glipiZIDE (GLUCOTROL XL) 2.5 MG 24 hr tablet ? ?Continuous Blood Gluc Receiver (FREESTYLE LIBRE READER) DEVI ? ?Continuous Blood Gluc Sensor (FREESTYLE LIBRE 14 DAY SENSOR) MISC ? ?2. Pharmacy (Name, [place]): [id number] [place] ?3. On Med List: Y ? ?4. Last Visit with PCP: 07-15-2021 ? ?5. Next visit date with PCP: n/a ? ? ?Agent: Please be advised that RX refills may take up to 3 business days. We ask that you follow-up with your pharmacy.  ?

## 2021-07-28 NOTE — Addendum Note (Signed)
Addended by: Terence Lux A on: 07/28/2021 01:40 PM ? ? Modules accepted: Orders ? ?

## 2021-07-28 NOTE — Addendum Note (Signed)
Addended by: Terence Lux A on: 07/28/2021 01:39 PM ? ? Modules accepted: Orders ? ?

## 2021-07-28 NOTE — Telephone Encounter (Signed)
Prescriptions sent to pharmacy

## 2021-07-29 ENCOUNTER — Telehealth: Payer: Self-pay | Admitting: Internal Medicine

## 2021-07-29 NOTE — Telephone Encounter (Signed)
PA for both the sensor and device have been completed and waiting for response. ? ?Key for device: [id number] ? ?Key for sensor: [id number] ? ?Patient notified.  ?

## 2021-07-29 NOTE — Telephone Encounter (Signed)
Patient calling in ? ?Wanting to know if PA has been started for [facility] Libra reader device & sensors ? ?Spoke w/ nurse & PA has not been started yet ? ?Please let patient know if she calls back ?

## 2021-08-26 ENCOUNTER — Other Ambulatory Visit: Payer: Self-pay | Admitting: Internal Medicine

## 2021-08-27 ENCOUNTER — Other Ambulatory Visit: Payer: Self-pay | Admitting: Internal Medicine

## 2021-08-27 DIAGNOSIS — K219 Gastro-esophageal reflux disease without esophagitis: Secondary | ICD-10-CM

## 2021-08-27 NOTE — Telephone Encounter (Signed)
Please refill as per office routine med refill policy (all routine meds to be refilled for 3 mo or monthly (per pt preference) up to one year from last visit, then month to month grace period for 3 mo, then further med refills will have to be denied) ? ?

## 2021-08-31 ENCOUNTER — Telehealth: Payer: Self-pay

## 2021-08-31 MED ORDER — DEXCOM G7 RECEIVER DEVI: 0 refills | Status: AC

## 2021-08-31 MED ORDER — DEXCOM G7 SENSOR MISC: 3 refills | Status: DC

## 2021-08-31 NOTE — Telephone Encounter (Signed)
Ok done

## 2021-08-31 NOTE — Telephone Encounter (Signed)
Pt is requesting Dexcom G7. Pt has talked to her insurance and they cover this device.  ? ?Pt is requesting the kit but in separate Rxs. ? ?Please advise ? ?

## 2021-09-01 NOTE — Telephone Encounter (Signed)
I advised pt [facility] [address]

## 2021-09-06 NOTE — Telephone Encounter (Signed)
[facility] not [facility]  ? ?[facility] and was informed by pharmacist a PA is required ? ?Informed pt of pharmacy response, pt would like to request a PA and requesting a cb w/ a status update ?

## 2021-09-07 NOTE — Telephone Encounter (Signed)
PA has been submitted to plan  ? ?Key: [id number] ?

## 2021-09-16 DIAGNOSIS — E119 Type 2 diabetes mellitus without complications: Secondary | ICD-10-CM | POA: Diagnosis not present

## 2021-09-16 DIAGNOSIS — Z794 Long term (current) use of insulin: Secondary | ICD-10-CM | POA: Diagnosis not present

## 2021-10-17 DIAGNOSIS — E119 Type 2 diabetes mellitus without complications: Secondary | ICD-10-CM | POA: Diagnosis not present

## 2021-10-17 DIAGNOSIS — Z794 Long term (current) use of insulin: Secondary | ICD-10-CM | POA: Diagnosis not present

## 2021-10-19 ENCOUNTER — Other Ambulatory Visit: Payer: Self-pay | Admitting: Internal Medicine

## 2021-11-16 ENCOUNTER — Other Ambulatory Visit: Payer: Self-pay | Admitting: Internal Medicine

## 2021-11-17 DIAGNOSIS — Z794 Long term (current) use of insulin: Secondary | ICD-10-CM | POA: Diagnosis not present

## 2021-11-17 DIAGNOSIS — E119 Type 2 diabetes mellitus without complications: Secondary | ICD-10-CM | POA: Diagnosis not present

## 2021-12-09 ENCOUNTER — Other Ambulatory Visit: Payer: Self-pay

## 2021-12-09 DIAGNOSIS — E1165 Type 2 diabetes mellitus with hyperglycemia: Secondary | ICD-10-CM

## 2021-12-09 MED ORDER — DEXCOM G7 SENSOR MISC: 3 refills | Status: AC

## 2021-12-13 DIAGNOSIS — E119 Type 2 diabetes mellitus without complications: Secondary | ICD-10-CM | POA: Diagnosis not present

## 2021-12-13 DIAGNOSIS — Z794 Long term (current) use of insulin: Secondary | ICD-10-CM | POA: Diagnosis not present

## 2021-12-20 ENCOUNTER — Ambulatory Visit: Payer: Medicare Other | Admitting: Internal Medicine

## 2021-12-30 ENCOUNTER — Other Ambulatory Visit: Payer: Self-pay | Admitting: Internal Medicine

## 2021-12-30 ENCOUNTER — Telehealth (INDEPENDENT_AMBULATORY_CARE_PROVIDER_SITE_OTHER): Payer: Medicare Other | Admitting: Nurse Practitioner

## 2021-12-30 ENCOUNTER — Encounter: Payer: Self-pay | Admitting: Nurse Practitioner

## 2021-12-30 DIAGNOSIS — N3000 Acute cystitis without hematuria: Secondary | ICD-10-CM

## 2021-12-30 MED ORDER — CIPROFLOXACIN HCL 250 MG PO TABS: 250.0000 mg | ORAL_TABLET | Freq: Two times a day (BID) | ORAL | 0 refills | Status: AC

## 2021-12-30 NOTE — Progress Notes (Signed)
   Established Patient Office Visit  An audio/visual tele-health visit was completed today for this patient. I connected with  Alyssa Ingram on 12/30/21 utilizing audio/visual technology and verified that I am speaking with the correct person using two identifiers. The patient was located at their home, and I was located at home during the encounter. I discussed the limitations of evaluation and management by telemedicine. The patient expressed understanding and agreed to proceed.     Subjective   Patient ID: Alyssa Ingram, female    DOB: 1945/06/22  Age: 76 y.o. MRN: [id number]  Chief Complaint  Patient presents with   Urinary Tract Infection    Patient arrives today for virtual visit for the above accompanied by her son.  Symptoms started about 2 days ago.  She is having dysuria and frequency.  She denies hematuria, fever, chills, diarrhea, nausea, vomiting.  She has a history of hospitalization in 2022 related to severe UTI.  They are calling today because they feel symptoms are similar to the UTI she had in 2022 and they want to treat this before it progresses.  She reports having been on ciprofloxacin in the past.  She is unable to come to the office today to provide a urine sample for testing.    Review of Systems  Constitutional:  Negative for chills and fever.  Gastrointestinal:  Negative for diarrhea, nausea and vomiting.  Genitourinary:  Positive for dysuria and frequency. Negative for hematuria.      Objective:     There were no vitals taken for this visit.   Physical Exam Comprehensive physical exam not completed today as office visit was conducted remotely.  Patient appears well today on video.  Patient was alert and oriented, and appeared to have appropriate judgment.   No results found for any visits on 12/30/21.    The 10-year ASCVD risk score (Arnett DK, et al., 2019) is: 42.5%    Assessment & Plan:  1.  Acute cystitis without hematuria: Symptoms are  consistent with urinary tract infection.  We will treat based on symptoms to prevent severe infection.  Patient has had history of UTI which was successfully treated with ciprofloxacin.  Due to patient's kidney function will prescribe ciprofloxacin 250 mg tablet to be taken twice a day x3 days.  Due to patient's history of severe UTI have provided enough tablets so patient can extend treatment duration if symptoms persist past 3 days.  Patient and son were educated about potential side effects including C. difficile, tendon rupture and what to do if these were to occur.  They report understanding.  They are encouraged to follow-up in person if symptoms persist or worsen despite antibiotic treatment.  They report their understanding. Problem List Items Addressed This Visit   None Visit Diagnoses     Acute cystitis without hematuria    -  Primary   Relevant Medications   ciprofloxacin (CIPRO) 250 MG tablet       Return in about 1 month (around 02/04/2022) for Routine f/u with PCP.    Ailene Ards, NP

## 2021-12-30 NOTE — Telephone Encounter (Signed)
Please refill as per office routine med refill policy (all routine meds to be refilled for 3 mo or monthly (per pt preference) up to one year from last visit, then month to month grace period for 3 mo, then further med refills will have to be denied) ? ?

## 2022-01-02 NOTE — Telephone Encounter (Signed)
Pt was last seen mar 2023  Pt is eligible for refill.  Please reconsider   Please refill as per office routine med refill policy (all routine meds to be refilled for 3 mo or monthly (per pt preference) up to one year from last visit, then month to month grace period for 3 mo, then further med refills will have to be denied)

## 2022-01-03 ENCOUNTER — Other Ambulatory Visit: Payer: Self-pay | Admitting: Internal Medicine

## 2022-01-03 NOTE — Telephone Encounter (Signed)
Please refill as per office routine med refill policy (all routine meds to be refilled for 3 mo or monthly (per pt preference) up to one year from last visit, then month to month grace period for 3 mo, then further med refills will have to be denied) ? ?

## 2022-01-04 ENCOUNTER — Other Ambulatory Visit: Payer: Self-pay

## 2022-01-04 MED ORDER — DILTIAZEM HCL ER COATED BEADS 180 MG PO CP24: 180.0000 mg | ORAL_CAPSULE | Freq: Every day | ORAL | 0 refills | Status: AC

## 2022-01-26 ENCOUNTER — Ambulatory Visit: Payer: Medicare Other | Admitting: Internal Medicine

## 2022-01-30 ENCOUNTER — Inpatient Hospital Stay: Payer: Self-pay

## 2022-01-30 ENCOUNTER — Encounter (HOSPITAL_COMMUNITY): Payer: Self-pay | Admitting: Emergency Medicine

## 2022-01-30 ENCOUNTER — Other Ambulatory Visit: Payer: Self-pay

## 2022-01-30 ENCOUNTER — Inpatient Hospital Stay (HOSPITAL_COMMUNITY): Payer: Medicare Other

## 2022-01-30 ENCOUNTER — Emergency Department (HOSPITAL_COMMUNITY): Payer: Medicare Other

## 2022-01-30 ENCOUNTER — Inpatient Hospital Stay (HOSPITAL_COMMUNITY)
Admission: EM | Admit: 2022-01-30 | Discharge: 2022-03-15 | DRG: 870 | Disposition: E | Payer: Medicare Other | Attending: Pulmonary Disease | Admitting: Pulmonary Disease

## 2022-01-30 DIAGNOSIS — G9340 Encephalopathy, unspecified: Secondary | ICD-10-CM | POA: Diagnosis not present

## 2022-01-30 DIAGNOSIS — N179 Acute kidney failure, unspecified: Secondary | ICD-10-CM | POA: Diagnosis present

## 2022-01-30 DIAGNOSIS — R9082 White matter disease, unspecified: Secondary | ICD-10-CM | POA: Diagnosis not present

## 2022-01-30 DIAGNOSIS — I340 Nonrheumatic mitral (valve) insufficiency: Secondary | ICD-10-CM | POA: Diagnosis not present

## 2022-01-30 DIAGNOSIS — Z90710 Acquired absence of both cervix and uterus: Secondary | ICD-10-CM

## 2022-01-30 DIAGNOSIS — I272 Pulmonary hypertension, unspecified: Secondary | ICD-10-CM | POA: Diagnosis not present

## 2022-01-30 DIAGNOSIS — I63431 Cerebral infarction due to embolism of right posterior cerebral artery: Secondary | ICD-10-CM | POA: Diagnosis not present

## 2022-01-30 DIAGNOSIS — E785 Hyperlipidemia, unspecified: Secondary | ICD-10-CM | POA: Diagnosis present

## 2022-01-30 DIAGNOSIS — I4819 Other persistent atrial fibrillation: Secondary | ICD-10-CM | POA: Diagnosis not present

## 2022-01-30 DIAGNOSIS — Y848 Other medical procedures as the cause of abnormal reaction of the patient, or of later complication, without mention of misadventure at the time of the procedure: Secondary | ICD-10-CM | POA: Diagnosis present

## 2022-01-30 DIAGNOSIS — E1129 Type 2 diabetes mellitus with other diabetic kidney complication: Secondary | ICD-10-CM | POA: Diagnosis not present

## 2022-01-30 DIAGNOSIS — I5033 Acute on chronic diastolic (congestive) heart failure: Secondary | ICD-10-CM | POA: Diagnosis not present

## 2022-01-30 DIAGNOSIS — D6959 Other secondary thrombocytopenia: Secondary | ICD-10-CM | POA: Diagnosis not present

## 2022-01-30 DIAGNOSIS — I76 Septic arterial embolism: Secondary | ICD-10-CM

## 2022-01-30 DIAGNOSIS — I110 Hypertensive heart disease with heart failure: Secondary | ICD-10-CM | POA: Diagnosis present

## 2022-01-30 DIAGNOSIS — Z7189 Other specified counseling: Secondary | ICD-10-CM | POA: Diagnosis not present

## 2022-01-30 DIAGNOSIS — R0600 Dyspnea, unspecified: Secondary | ICD-10-CM | POA: Diagnosis not present

## 2022-01-30 DIAGNOSIS — N170 Acute kidney failure with tubular necrosis: Secondary | ICD-10-CM | POA: Diagnosis present

## 2022-01-30 DIAGNOSIS — E871 Hypo-osmolality and hyponatremia: Secondary | ICD-10-CM | POA: Diagnosis present

## 2022-01-30 DIAGNOSIS — Z79899 Other long term (current) drug therapy: Secondary | ICD-10-CM

## 2022-01-30 DIAGNOSIS — K219 Gastro-esophageal reflux disease without esophagitis: Secondary | ICD-10-CM | POA: Diagnosis present

## 2022-01-30 DIAGNOSIS — I6523 Occlusion and stenosis of bilateral carotid arteries: Secondary | ICD-10-CM | POA: Diagnosis not present

## 2022-01-30 DIAGNOSIS — J45909 Unspecified asthma, uncomplicated: Secondary | ICD-10-CM | POA: Diagnosis present

## 2022-01-30 DIAGNOSIS — Z1152 Encounter for screening for COVID-19: Secondary | ICD-10-CM

## 2022-01-30 DIAGNOSIS — R404 Transient alteration of awareness: Secondary | ICD-10-CM | POA: Diagnosis not present

## 2022-01-30 DIAGNOSIS — E1165 Type 2 diabetes mellitus with hyperglycemia: Secondary | ICD-10-CM | POA: Diagnosis present

## 2022-01-30 DIAGNOSIS — Z6841 Body Mass Index (BMI) 40.0 and over, adult: Secondary | ICD-10-CM

## 2022-01-30 DIAGNOSIS — I10 Essential (primary) hypertension: Secondary | ICD-10-CM | POA: Diagnosis not present

## 2022-01-30 DIAGNOSIS — Z9911 Dependence on respirator [ventilator] status: Secondary | ICD-10-CM

## 2022-01-30 DIAGNOSIS — R7881 Bacteremia: Secondary | ICD-10-CM | POA: Diagnosis not present

## 2022-01-30 DIAGNOSIS — E8881 Metabolic syndrome: Secondary | ICD-10-CM | POA: Diagnosis present

## 2022-01-30 DIAGNOSIS — G9341 Metabolic encephalopathy: Secondary | ICD-10-CM | POA: Diagnosis not present

## 2022-01-30 DIAGNOSIS — E11319 Type 2 diabetes mellitus with unspecified diabetic retinopathy without macular edema: Secondary | ICD-10-CM | POA: Diagnosis not present

## 2022-01-30 DIAGNOSIS — I499 Cardiac arrhythmia, unspecified: Secondary | ICD-10-CM | POA: Diagnosis not present

## 2022-01-30 DIAGNOSIS — R569 Unspecified convulsions: Secondary | ICD-10-CM | POA: Diagnosis present

## 2022-01-30 DIAGNOSIS — Z801 Family history of malignant neoplasm of trachea, bronchus and lung: Secondary | ICD-10-CM

## 2022-01-30 DIAGNOSIS — Y95 Nosocomial condition: Secondary | ICD-10-CM | POA: Diagnosis not present

## 2022-01-30 DIAGNOSIS — Z87442 Personal history of urinary calculi: Secondary | ICD-10-CM | POA: Diagnosis not present

## 2022-01-30 DIAGNOSIS — A419 Sepsis, unspecified organism: Secondary | ICD-10-CM | POA: Diagnosis not present

## 2022-01-30 DIAGNOSIS — E874 Mixed disorder of acid-base balance: Secondary | ICD-10-CM | POA: Diagnosis present

## 2022-01-30 DIAGNOSIS — R579 Shock, unspecified: Secondary | ICD-10-CM

## 2022-01-30 DIAGNOSIS — L89151 Pressure ulcer of sacral region, stage 1: Secondary | ICD-10-CM | POA: Diagnosis present

## 2022-01-30 DIAGNOSIS — I421 Obstructive hypertrophic cardiomyopathy: Secondary | ICD-10-CM | POA: Diagnosis present

## 2022-01-30 DIAGNOSIS — I330 Acute and subacute infective endocarditis: Secondary | ICD-10-CM | POA: Diagnosis not present

## 2022-01-30 DIAGNOSIS — K8020 Calculus of gallbladder without cholecystitis without obstruction: Secondary | ICD-10-CM | POA: Diagnosis not present

## 2022-01-30 DIAGNOSIS — I462 Cardiac arrest due to underlying cardiac condition: Secondary | ICD-10-CM | POA: Diagnosis present

## 2022-01-30 DIAGNOSIS — J690 Pneumonitis due to inhalation of food and vomit: Secondary | ICD-10-CM | POA: Diagnosis not present

## 2022-01-30 DIAGNOSIS — N281 Cyst of kidney, acquired: Secondary | ICD-10-CM | POA: Diagnosis not present

## 2022-01-30 DIAGNOSIS — I700 Atherosclerosis of aorta: Secondary | ICD-10-CM | POA: Diagnosis not present

## 2022-01-30 DIAGNOSIS — J9690 Respiratory failure, unspecified, unspecified whether with hypoxia or hypercapnia: Secondary | ICD-10-CM | POA: Diagnosis not present

## 2022-01-30 DIAGNOSIS — L8990 Pressure ulcer of unspecified site, unspecified stage: Secondary | ICD-10-CM | POA: Insufficient documentation

## 2022-01-30 DIAGNOSIS — D696 Thrombocytopenia, unspecified: Secondary | ICD-10-CM | POA: Diagnosis not present

## 2022-01-30 DIAGNOSIS — R2681 Unsteadiness on feet: Secondary | ICD-10-CM | POA: Diagnosis present

## 2022-01-30 DIAGNOSIS — Z8249 Family history of ischemic heart disease and other diseases of the circulatory system: Secondary | ICD-10-CM

## 2022-01-30 DIAGNOSIS — A4181 Sepsis due to Enterococcus: Secondary | ICD-10-CM | POA: Diagnosis not present

## 2022-01-30 DIAGNOSIS — M1990 Unspecified osteoarthritis, unspecified site: Secondary | ICD-10-CM | POA: Diagnosis present

## 2022-01-30 DIAGNOSIS — I469 Cardiac arrest, cause unspecified: Secondary | ICD-10-CM | POA: Diagnosis not present

## 2022-01-30 DIAGNOSIS — D638 Anemia in other chronic diseases classified elsewhere: Secondary | ICD-10-CM | POA: Diagnosis present

## 2022-01-30 DIAGNOSIS — I471 Supraventricular tachycardia, unspecified: Secondary | ICD-10-CM | POA: Diagnosis present

## 2022-01-30 DIAGNOSIS — Z4682 Encounter for fitting and adjustment of non-vascular catheter: Secondary | ICD-10-CM | POA: Diagnosis not present

## 2022-01-30 DIAGNOSIS — I6312 Cerebral infarction due to embolism of basilar artery: Secondary | ICD-10-CM | POA: Diagnosis not present

## 2022-01-30 DIAGNOSIS — J9601 Acute respiratory failure with hypoxia: Secondary | ICD-10-CM

## 2022-01-30 DIAGNOSIS — K5730 Diverticulosis of large intestine without perforation or abscess without bleeding: Secondary | ICD-10-CM | POA: Diagnosis not present

## 2022-01-30 DIAGNOSIS — I509 Heart failure, unspecified: Secondary | ICD-10-CM | POA: Diagnosis not present

## 2022-01-30 DIAGNOSIS — Z7984 Long term (current) use of oral hypoglycemic drugs: Secondary | ICD-10-CM

## 2022-01-30 DIAGNOSIS — J80 Acute respiratory distress syndrome: Secondary | ICD-10-CM

## 2022-01-30 DIAGNOSIS — R0989 Other specified symptoms and signs involving the circulatory and respiratory systems: Secondary | ICD-10-CM | POA: Diagnosis not present

## 2022-01-30 DIAGNOSIS — Z452 Encounter for adjustment and management of vascular access device: Secondary | ICD-10-CM | POA: Diagnosis not present

## 2022-01-30 DIAGNOSIS — R6521 Severe sepsis with septic shock: Secondary | ICD-10-CM | POA: Diagnosis not present

## 2022-01-30 DIAGNOSIS — J984 Other disorders of lung: Secondary | ICD-10-CM | POA: Diagnosis not present

## 2022-01-30 DIAGNOSIS — I639 Cerebral infarction, unspecified: Secondary | ICD-10-CM | POA: Diagnosis not present

## 2022-01-30 DIAGNOSIS — E875 Hyperkalemia: Secondary | ICD-10-CM | POA: Diagnosis not present

## 2022-01-30 DIAGNOSIS — R4182 Altered mental status, unspecified: Secondary | ICD-10-CM | POA: Diagnosis not present

## 2022-01-30 DIAGNOSIS — R109 Unspecified abdominal pain: Secondary | ICD-10-CM | POA: Diagnosis present

## 2022-01-30 DIAGNOSIS — Z8541 Personal history of malignant neoplasm of cervix uteri: Secondary | ICD-10-CM

## 2022-01-30 DIAGNOSIS — J811 Chronic pulmonary edema: Secondary | ICD-10-CM | POA: Diagnosis not present

## 2022-01-30 DIAGNOSIS — R0902 Hypoxemia: Secondary | ICD-10-CM | POA: Diagnosis not present

## 2022-01-30 DIAGNOSIS — I2489 Other forms of acute ischemic heart disease: Secondary | ICD-10-CM | POA: Diagnosis present

## 2022-01-30 DIAGNOSIS — I6381 Other cerebral infarction due to occlusion or stenosis of small artery: Secondary | ICD-10-CM | POA: Diagnosis not present

## 2022-01-30 DIAGNOSIS — J189 Pneumonia, unspecified organism: Secondary | ICD-10-CM | POA: Diagnosis not present

## 2022-01-30 DIAGNOSIS — Z515 Encounter for palliative care: Secondary | ICD-10-CM

## 2022-01-30 DIAGNOSIS — R9431 Abnormal electrocardiogram [ECG] [EKG]: Secondary | ICD-10-CM | POA: Diagnosis not present

## 2022-01-30 DIAGNOSIS — E1065 Type 1 diabetes mellitus with hyperglycemia: Secondary | ICD-10-CM | POA: Diagnosis not present

## 2022-01-30 DIAGNOSIS — Z833 Family history of diabetes mellitus: Secondary | ICD-10-CM

## 2022-01-30 DIAGNOSIS — Z743 Need for continuous supervision: Secondary | ICD-10-CM | POA: Diagnosis not present

## 2022-01-30 DIAGNOSIS — I059 Rheumatic mitral valve disease, unspecified: Secondary | ICD-10-CM | POA: Diagnosis not present

## 2022-01-30 DIAGNOSIS — R54 Age-related physical debility: Secondary | ICD-10-CM | POA: Diagnosis present

## 2022-01-30 DIAGNOSIS — G8929 Other chronic pain: Secondary | ICD-10-CM | POA: Diagnosis present

## 2022-01-30 DIAGNOSIS — I428 Other cardiomyopathies: Secondary | ICD-10-CM

## 2022-01-30 DIAGNOSIS — J90 Pleural effusion, not elsewhere classified: Secondary | ICD-10-CM | POA: Diagnosis not present

## 2022-01-30 DIAGNOSIS — I63411 Cerebral infarction due to embolism of right middle cerebral artery: Secondary | ICD-10-CM | POA: Diagnosis present

## 2022-01-30 DIAGNOSIS — I080 Rheumatic disorders of both mitral and aortic valves: Secondary | ICD-10-CM | POA: Diagnosis present

## 2022-01-30 DIAGNOSIS — J95851 Ventilator associated pneumonia: Secondary | ICD-10-CM | POA: Diagnosis not present

## 2022-01-30 DIAGNOSIS — B952 Enterococcus as the cause of diseases classified elsewhere: Secondary | ICD-10-CM

## 2022-01-30 DIAGNOSIS — R6889 Other general symptoms and signs: Secondary | ICD-10-CM | POA: Diagnosis not present

## 2022-01-30 DIAGNOSIS — H5702 Anisocoria: Secondary | ICD-10-CM | POA: Diagnosis not present

## 2022-01-30 DIAGNOSIS — M79673 Pain in unspecified foot: Secondary | ICD-10-CM | POA: Diagnosis present

## 2022-01-30 HISTORY — DX: Atrial premature depolarization: I49.1

## 2022-01-30 HISTORY — DX: Nonrheumatic mitral (valve) insufficiency: I34.0

## 2022-01-30 HISTORY — DX: Ventricular premature depolarization: I49.3

## 2022-01-30 HISTORY — DX: Rheumatic mitral stenosis: I05.0

## 2022-01-30 LAB — ECHOCARDIOGRAM COMPLETE
AR max vel: 2.81 cm2
AV Area VTI: 2.56 cm2
AV Area mean vel: 2.64 cm2
AV Mean grad: 8 mmHg
AV Peak grad: 15.7 mmHg
Ao pk vel: 1.98 m/s
Area-P 1/2: 3.48 cm2
Height: 60 in
MV M vel: 5.02 m/s
MV Peak grad: 100.8 mmHg
MV VTI: 1.33 cm2
Radius: 0.5 cm
S' Lateral: 2.2 cm
Weight: 2821.89 oz

## 2022-01-30 LAB — COMPREHENSIVE METABOLIC PANEL
ALT: 112 U/L — ABNORMAL HIGH (ref 0–44)
AST: 210 U/L — ABNORMAL HIGH (ref 15–41)
Albumin: 2.4 g/dL — ABNORMAL LOW (ref 3.5–5.0)
Alkaline Phosphatase: 88 U/L (ref 38–126)
Anion gap: 16 — ABNORMAL HIGH (ref 5–15)
BUN: 28 mg/dL — ABNORMAL HIGH (ref 8–23)
CO2: 20 mmol/L — ABNORMAL LOW (ref 22–32)
Calcium: 8.6 mg/dL — ABNORMAL LOW (ref 8.9–10.3)
Chloride: 103 mmol/L (ref 98–111)
Creatinine, Ser: 1.52 mg/dL — ABNORMAL HIGH (ref 0.44–1.00)
GFR, Estimated: 35 mL/min — ABNORMAL LOW (ref >60)
Glucose, Bld: 217 mg/dL — ABNORMAL HIGH (ref 70–99)
Potassium: 4.2 mmol/L (ref 3.5–5.1)
Sodium: 139 mmol/L (ref 135–145)
Total Bilirubin: 1.6 mg/dL — ABNORMAL HIGH (ref 0.3–1.2)
Total Protein: 7.1 g/dL (ref 6.5–8.1)

## 2022-01-30 LAB — PHOSPHORUS
Phosphorus: 5.6 mg/dL — ABNORMAL HIGH (ref 2.5–4.6)
Phosphorus: 4 mg/dL (ref 2.5–4.6)

## 2022-01-30 LAB — RAPID URINE DRUG SCREEN, HOSP PERFORMED
Amphetamines: NOT DETECTED
Barbiturates: NOT DETECTED
Benzodiazepines: POSITIVE — AB
Cocaine: NOT DETECTED
Opiates: NOT DETECTED
Tetrahydrocannabinol: NOT DETECTED

## 2022-01-30 LAB — URINALYSIS, ROUTINE W REFLEX MICROSCOPIC
Bilirubin Urine: NEGATIVE
Glucose, UA: 150 mg/dL — AB
Ketones, ur: NEGATIVE mg/dL
Leukocytes,Ua: NEGATIVE
Nitrite: NEGATIVE
Protein, ur: 100 mg/dL — AB
Specific Gravity, Urine: 1.012 (ref 1.005–1.030)
pH: 5 (ref 5.0–8.0)

## 2022-01-30 LAB — I-STAT ARTERIAL BLOOD GAS, ED
Acid-base deficit: 2 mmol/L (ref 0.0–2.0)
Bicarbonate: 21.7 mmol/L (ref 20.0–28.0)
Calcium, Ion: 1.12 mmol/L — ABNORMAL LOW (ref 1.15–1.40)
HCT: 25 % — ABNORMAL LOW (ref 36.0–46.0)
Hemoglobin: 8.5 g/dL — ABNORMAL LOW (ref 12.0–15.0)
O2 Saturation: 93 %
Patient temperature: 99.4
Potassium: 3.4 mmol/L — ABNORMAL LOW (ref 3.5–5.1)
Sodium: 140 mmol/L (ref 135–145)
TCO2: 23 mmol/L (ref 22–32)
pCO2 arterial: 33.5 mmHg (ref 32–48)
pH, Arterial: 7.421 (ref 7.35–7.45)
pO2, Arterial: 65 mmHg — ABNORMAL LOW (ref 83–108)

## 2022-01-30 LAB — CBC
HCT: 28.5 % — ABNORMAL LOW (ref 36.0–46.0)
HCT: 30.1 % — ABNORMAL LOW (ref 36.0–46.0)
Hemoglobin: 9.3 g/dL — ABNORMAL LOW (ref 12.0–15.0)
Hemoglobin: 9.8 g/dL — ABNORMAL LOW (ref 12.0–15.0)
MCH: 27.2 pg (ref 26.0–34.0)
MCH: 26.6 pg (ref 26.0–34.0)
MCHC: 32.6 g/dL (ref 30.0–36.0)
MCHC: 32.6 g/dL (ref 30.0–36.0)
MCV: 83.3 fL (ref 80.0–100.0)
MCV: 81.6 fL (ref 80.0–100.0)
Platelets: 248 10*3/uL (ref 150–400)
Platelets: 224 10*3/uL (ref 150–400)
RBC: 3.42 MIL/uL — ABNORMAL LOW (ref 3.87–5.11)
RBC: 3.69 MIL/uL — ABNORMAL LOW (ref 3.87–5.11)
RDW: 16.7 % — ABNORMAL HIGH (ref 11.5–15.5)
RDW: 16.5 % — ABNORMAL HIGH (ref 11.5–15.5)
WBC: 25.9 10*3/uL — ABNORMAL HIGH (ref 4.0–10.5)
WBC: 23.5 10*3/uL — ABNORMAL HIGH (ref 4.0–10.5)
nRBC: 0 % (ref 0.0–0.2)
nRBC: 0 % (ref 0.0–0.2)

## 2022-01-30 LAB — LACTIC ACID, PLASMA
Lactic Acid, Venous: 7.3 mmol/L (ref 0.5–1.9)
Lactic Acid, Venous: 3.2 mmol/L (ref 0.5–1.9)
Lactic Acid, Venous: 2 mmol/L (ref 0.5–1.9)
Lactic Acid, Venous: 1 mmol/L (ref 0.5–1.9)

## 2022-01-30 LAB — HEMOGLOBIN A1C
Hgb A1c MFr Bld: 7.2 % — ABNORMAL HIGH (ref 4.8–5.6)
Mean Plasma Glucose: 159.94 mg/dL

## 2022-01-30 LAB — MAGNESIUM
Magnesium: 2.1 mg/dL (ref 1.7–2.4)
Magnesium: 1.9 mg/dL (ref 1.7–2.4)

## 2022-01-30 LAB — STREP PNEUMONIAE URINARY ANTIGEN: Strep Pneumo Urinary Antigen: NEGATIVE

## 2022-01-30 LAB — GLUCOSE, CAPILLARY
Glucose-Capillary: 187 mg/dL — ABNORMAL HIGH (ref 70–99)
Glucose-Capillary: 188 mg/dL — ABNORMAL HIGH (ref 70–99)
Glucose-Capillary: 190 mg/dL — ABNORMAL HIGH (ref 70–99)
Glucose-Capillary: 203 mg/dL — ABNORMAL HIGH (ref 70–99)
Glucose-Capillary: 211 mg/dL — ABNORMAL HIGH (ref 70–99)

## 2022-01-30 LAB — ABO/RH: ABO/RH(D): O POS

## 2022-01-30 LAB — LIPASE, BLOOD: Lipase: 25 U/L (ref 11–51)

## 2022-01-30 LAB — PROCALCITONIN: Procalcitonin: 2.55 ng/mL

## 2022-01-30 LAB — TYPE AND SCREEN
ABO/RH(D): O POS
Antibody Screen: NEGATIVE

## 2022-01-30 LAB — TROPONIN I (HIGH SENSITIVITY)
Troponin I (High Sensitivity): 94 ng/L — ABNORMAL HIGH (ref <18)
Troponin I (High Sensitivity): 332 ng/L (ref <18)

## 2022-01-30 LAB — BRAIN NATRIURETIC PEPTIDE: B Natriuretic Peptide: 835.7 pg/mL — ABNORMAL HIGH (ref 0.0–100.0)

## 2022-01-30 LAB — PROTIME-INR
INR: 1.4 — ABNORMAL HIGH (ref 0.8–1.2)
Prothrombin Time: 16.8 seconds — ABNORMAL HIGH (ref 11.4–15.2)

## 2022-01-30 LAB — TSH: TSH: 0.961 u[IU]/mL (ref 0.350–4.500)

## 2022-01-30 LAB — APTT: aPTT: 30 seconds (ref 24–36)

## 2022-01-30 LAB — MRSA NEXT GEN BY PCR, NASAL: MRSA by PCR Next Gen: NOT DETECTED

## 2022-01-30 LAB — TRIGLYCERIDES: Triglycerides: 89 mg/dL (ref <150)

## 2022-01-30 MED ORDER — MIDAZOLAM HCL 2 MG/2ML IJ SOLN
1.0000 mg | INTRAMUSCULAR | Status: DC | PRN
  Administered 2022-02-03 – 2022-02-12 (×6): 1 mg via INTRAVENOUS
  Filled 2022-01-30 (×8): qty 2

## 2022-01-30 MED ORDER — SODIUM CHLORIDE 0.9 % IV SOLN: 500.0000 mg | INTRAVENOUS | Status: DC

## 2022-01-30 MED ORDER — ADULT MULTIVITAMIN LIQUID CH
15.0000 mL | Freq: Every day | ORAL | Status: DC
  Administered 2022-01-30 – 2022-02-13 (×15): 15 mL
  Filled 2022-01-30 (×16): qty 15

## 2022-01-30 MED ORDER — NOREPINEPHRINE 4 MG/250ML-% IV SOLN
2.0000 ug/min | INTRAVENOUS | Status: DC
  Administered 2022-01-30: 8 ug/min via INTRAVENOUS
  Filled 2022-01-30: qty 250

## 2022-01-30 MED ORDER — VANCOMYCIN HCL 1500 MG/300ML IV SOLN
1500.0000 mg | Freq: Once | INTRAVENOUS | Status: AC
  Administered 2022-01-30: 1500 mg via INTRAVENOUS
  Filled 2022-01-30: qty 300

## 2022-01-30 MED ORDER — ETOMIDATE 2 MG/ML IV SOLN
INTRAVENOUS | Status: AC | PRN
  Administered 2022-01-30: 30 mg via INTRAVENOUS

## 2022-01-30 MED ORDER — POTASSIUM CHLORIDE 20 MEQ PO PACK
40.0000 meq | PACK | Freq: Once | ORAL | Status: AC
  Administered 2022-01-30: 40 meq
  Filled 2022-01-30: qty 2

## 2022-01-30 MED ORDER — NOREPINEPHRINE 4 MG/250ML-% IV SOLN
0.0000 ug/min | INTRAVENOUS | Status: DC
  Administered 2022-01-30 (×2): 15 ug/min via INTRAVENOUS
  Administered 2022-01-30: 14 ug/min via INTRAVENOUS
  Administered 2022-01-31: 7 ug/min via INTRAVENOUS
  Administered 2022-02-01: 2 ug/min via INTRAVENOUS
  Filled 2022-01-30 (×5): qty 250

## 2022-01-30 MED ORDER — ADULT MULTIVITAMIN W/MINERALS CH
1.0000 | ORAL_TABLET | Freq: Every day | ORAL | Status: DC
  Filled 2022-01-30: qty 1

## 2022-01-30 MED ORDER — IPRATROPIUM-ALBUTEROL 0.5-2.5 (3) MG/3ML IN SOLN: 3.0000 mL | Freq: Four times a day (QID) | RESPIRATORY_TRACT | Status: DC | PRN

## 2022-01-30 MED ORDER — PIPERACILLIN-TAZOBACTAM 3.375 G IVPB 30 MIN
3.3750 g | Freq: Once | INTRAVENOUS | Status: AC
  Administered 2022-01-30: 3.375 g via INTRAVENOUS
  Filled 2022-01-30: qty 50

## 2022-01-30 MED ORDER — ORAL CARE MOUTH RINSE: 15.0000 mL | OROMUCOSAL | Status: DC | PRN

## 2022-01-30 MED ORDER — ROCURONIUM BROMIDE 50 MG/5ML IV SOLN
INTRAVENOUS | Status: AC | PRN
  Administered 2022-01-30: 100 mg via INTRAVENOUS

## 2022-01-30 MED ORDER — SODIUM CHLORIDE 0.9% FLUSH: 10.0000 mL | INTRAVENOUS | Status: DC | PRN

## 2022-01-30 MED ORDER — FENTANYL CITRATE PF 50 MCG/ML IJ SOSY
25.0000 ug | PREFILLED_SYRINGE | INTRAMUSCULAR | Status: AC | PRN
  Administered 2022-01-31 – 2022-02-01 (×3): 25 ug via INTRAVENOUS

## 2022-01-30 MED ORDER — SODIUM CHLORIDE 0.9 % IV SOLN: INTRAVENOUS | Status: DC | PRN

## 2022-01-30 MED ORDER — SODIUM CHLORIDE 0.9 % IV SOLN
500.0000 mg | INTRAVENOUS | Status: DC
  Administered 2022-01-30: 500 mg via INTRAVENOUS
  Filled 2022-01-30 (×2): qty 5

## 2022-01-30 MED ORDER — INSULIN ASPART 100 UNIT/ML IJ SOLN
0.0000 [IU] | INTRAMUSCULAR | Status: DC
  Administered 2022-01-30 (×2): 3 [IU] via SUBCUTANEOUS
  Administered 2022-01-30: 5 [IU] via SUBCUTANEOUS
  Administered 2022-01-30: 3 [IU] via SUBCUTANEOUS
  Administered 2022-01-31: 2 [IU] via SUBCUTANEOUS
  Administered 2022-01-31 (×2): 5 [IU] via SUBCUTANEOUS
  Administered 2022-01-31: 2 [IU] via SUBCUTANEOUS
  Administered 2022-01-31: 5 [IU] via SUBCUTANEOUS
  Administered 2022-01-31: 3 [IU] via SUBCUTANEOUS
  Administered 2022-02-01 (×3): 2 [IU] via SUBCUTANEOUS
  Administered 2022-02-01 (×2): 3 [IU] via SUBCUTANEOUS
  Administered 2022-02-02: 5 [IU] via SUBCUTANEOUS
  Administered 2022-02-02 (×3): 3 [IU] via SUBCUTANEOUS
  Administered 2022-02-02: 5 [IU] via SUBCUTANEOUS
  Administered 2022-02-03 (×3): 3 [IU] via SUBCUTANEOUS
  Administered 2022-02-03 (×2): 5 [IU] via SUBCUTANEOUS
  Administered 2022-02-04: 2 [IU] via SUBCUTANEOUS
  Administered 2022-02-04 (×2): 3 [IU] via SUBCUTANEOUS
  Administered 2022-02-04: 2 [IU] via SUBCUTANEOUS
  Administered 2022-02-04: 3 [IU] via SUBCUTANEOUS
  Administered 2022-02-04 – 2022-02-05 (×2): 2 [IU] via SUBCUTANEOUS
  Administered 2022-02-05 (×2): 3 [IU] via SUBCUTANEOUS
  Administered 2022-02-06 (×4): 2 [IU] via SUBCUTANEOUS
  Administered 2022-02-06: 3 [IU] via SUBCUTANEOUS
  Administered 2022-02-07 (×2): 2 [IU] via SUBCUTANEOUS
  Administered 2022-02-07 – 2022-02-08 (×5): 3 [IU] via SUBCUTANEOUS
  Administered 2022-02-08 – 2022-02-10 (×6): 2 [IU] via SUBCUTANEOUS
  Administered 2022-02-11: 3 [IU] via SUBCUTANEOUS
  Administered 2022-02-11 – 2022-02-12 (×3): 2 [IU] via SUBCUTANEOUS
  Administered 2022-02-12 (×2): 3 [IU] via SUBCUTANEOUS
  Administered 2022-02-12 (×2): 2 [IU] via SUBCUTANEOUS
  Administered 2022-02-13 (×3): 3 [IU] via SUBCUTANEOUS
  Administered 2022-02-13: 5 [IU] via SUBCUTANEOUS
  Administered 2022-02-13 (×2): 3 [IU] via SUBCUTANEOUS
  Administered 2022-02-13: 5 [IU] via SUBCUTANEOUS
  Administered 2022-02-14: 3 [IU] via SUBCUTANEOUS
  Administered 2022-02-14: 2 [IU] via SUBCUTANEOUS
  Administered 2022-02-14 (×2): 3 [IU] via SUBCUTANEOUS
  Administered 2022-02-15 (×6): 5 [IU] via SUBCUTANEOUS
  Administered 2022-02-15 – 2022-02-16 (×4): 3 [IU] via SUBCUTANEOUS
  Administered 2022-02-16 – 2022-02-17 (×2): 2 [IU] via SUBCUTANEOUS

## 2022-01-30 MED ORDER — FENTANYL CITRATE PF 50 MCG/ML IJ SOSY
25.0000 ug | PREFILLED_SYRINGE | INTRAMUSCULAR | Status: DC | PRN
  Administered 2022-01-31 – 2022-02-08 (×21): 50 ug via INTRAVENOUS
  Administered 2022-02-08: 100 ug via INTRAVENOUS
  Administered 2022-02-08 – 2022-02-09 (×2): 50 ug via INTRAVENOUS
  Administered 2022-02-10 – 2022-02-12 (×2): 100 ug via INTRAVENOUS
  Filled 2022-01-30: qty 2
  Filled 2022-01-30 (×2): qty 1
  Filled 2022-01-30: qty 2
  Filled 2022-01-30 (×3): qty 1
  Filled 2022-01-30: qty 2
  Filled 2022-01-30: qty 1
  Filled 2022-01-30 (×2): qty 2
  Filled 2022-01-30 (×8): qty 1
  Filled 2022-01-30: qty 2
  Filled 2022-01-30: qty 1
  Filled 2022-01-30 (×2): qty 2
  Filled 2022-01-30 (×4): qty 1

## 2022-01-30 MED ORDER — PIPERACILLIN-TAZOBACTAM 3.375 G IVPB
3.3750 g | Freq: Three times a day (TID) | INTRAVENOUS | Status: DC
  Administered 2022-01-30 – 2022-01-31 (×3): 3.375 g via INTRAVENOUS
  Filled 2022-01-30 (×3): qty 50

## 2022-01-30 MED ORDER — HEPARIN SODIUM (PORCINE) 5000 UNIT/ML IJ SOLN
5000.0000 [IU] | Freq: Three times a day (TID) | INTRAMUSCULAR | Status: DC
  Administered 2022-01-30 – 2022-02-12 (×38): 5000 [IU] via SUBCUTANEOUS
  Filled 2022-01-30 (×38): qty 1

## 2022-01-30 MED ORDER — DOCUSATE SODIUM 100 MG PO CAPS: 100.0000 mg | ORAL_CAPSULE | Freq: Two times a day (BID) | ORAL | Status: DC | PRN

## 2022-01-30 MED ORDER — PROPOFOL 1000 MG/100ML IV EMUL: 5.0000 ug/kg/min | INTRAVENOUS | Status: DC

## 2022-01-30 MED ORDER — SODIUM CHLORIDE 0.9 % IV SOLN
INTRAVENOUS | Status: AC | PRN
  Administered 2022-01-30: 1000 mL via INTRAVENOUS

## 2022-01-30 MED ORDER — ORAL CARE MOUTH RINSE
15.0000 mL | OROMUCOSAL | Status: DC
  Administered 2022-01-30 – 2022-02-19 (×241): 15 mL via OROMUCOSAL

## 2022-01-30 MED ORDER — ACETAMINOPHEN 325 MG PO TABS
650.0000 mg | ORAL_TABLET | ORAL | Status: DC | PRN
  Administered 2022-01-31 – 2022-02-08 (×2): 650 mg
  Filled 2022-01-30 (×3): qty 2

## 2022-01-30 MED ORDER — POLYETHYLENE GLYCOL 3350 17 G PO PACK
17.0000 g | PACK | Freq: Every day | ORAL | Status: DC | PRN
  Administered 2022-01-31: 17 g
  Filled 2022-01-30: qty 1

## 2022-01-30 MED ORDER — LACTATED RINGERS IV BOLUS
1000.0000 mL | Freq: Once | INTRAVENOUS | Status: AC
  Administered 2022-01-30: 1000 mL via INTRAVENOUS

## 2022-01-30 MED ORDER — EPINEPHRINE 1 MG/10ML IJ SOSY
PREFILLED_SYRINGE | INTRAMUSCULAR | Status: AC | PRN
  Administered 2022-01-30: 1 mg via INTRAVENOUS

## 2022-01-30 MED ORDER — DOCUSATE SODIUM 50 MG/5ML PO LIQD: 100.0000 mg | Freq: Two times a day (BID) | ORAL | Status: DC | PRN

## 2022-01-30 MED ORDER — PROPOFOL 1000 MG/100ML IV EMUL
INTRAVENOUS | Status: AC
  Filled 2022-01-30: qty 100

## 2022-01-30 MED ORDER — PANTOPRAZOLE 2 MG/ML SUSPENSION
40.0000 mg | Freq: Every day | ORAL | Status: DC
  Administered 2022-01-30 – 2022-02-19 (×21): 40 mg
  Filled 2022-01-30 (×22): qty 20

## 2022-01-30 MED ORDER — SODIUM CHLORIDE 0.9% FLUSH
10.0000 mL | Freq: Two times a day (BID) | INTRAVENOUS | Status: DC
  Administered 2022-01-30 – 2022-02-16 (×34): 10 mL
  Administered 2022-02-16: 40 mL
  Administered 2022-02-17 – 2022-02-19 (×5): 10 mL
  Administered 2022-02-19: 20 mL

## 2022-01-30 MED ORDER — CHLORHEXIDINE GLUCONATE CLOTH 2 % EX PADS
6.0000 | MEDICATED_PAD | Freq: Every day | CUTANEOUS | Status: DC
  Administered 2022-01-30 – 2022-02-19 (×25): 6 via TOPICAL

## 2022-01-30 MED ORDER — MIDAZOLAM HCL 2 MG/2ML IJ SOLN
1.0000 mg | INTRAMUSCULAR | Status: AC | PRN
  Administered 2022-01-31 – 2022-02-01 (×3): 1 mg via INTRAVENOUS
  Filled 2022-01-30 (×3): qty 2

## 2022-01-30 MED ORDER — ONDANSETRON HCL 4 MG/2ML IJ SOLN: 4.0000 mg | Freq: Four times a day (QID) | INTRAMUSCULAR | Status: DC | PRN

## 2022-01-30 MED ORDER — VITAL AF 1.2 CAL PO LIQD
1000.0000 mL | ORAL | Status: DC
  Administered 2022-01-30 – 2022-02-11 (×9): 1000 mL

## 2022-01-30 MED ORDER — NOREPINEPHRINE 4 MG/250ML-% IV SOLN
5.0000 ug/min | INTRAVENOUS | Status: DC
  Administered 2022-01-30: 5 ug/min via INTRAVENOUS
  Filled 2022-01-30: qty 250

## 2022-01-30 MED ORDER — POTASSIUM CHLORIDE CRYS ER 20 MEQ PO TBCR
40.0000 meq | EXTENDED_RELEASE_TABLET | Freq: Once | ORAL | Status: DC
  Filled 2022-01-30: qty 2

## 2022-01-30 MED ORDER — IPRATROPIUM-ALBUTEROL 0.5-2.5 (3) MG/3ML IN SOLN
3.0000 mL | Freq: Four times a day (QID) | RESPIRATORY_TRACT | Status: DC
  Administered 2022-01-30 – 2022-02-16 (×68): 3 mL via RESPIRATORY_TRACT
  Filled 2022-01-30 (×69): qty 3

## 2022-01-30 MED ORDER — VANCOMYCIN HCL 1250 MG/250ML IV SOLN: 1250.0000 mg | INTRAVENOUS | Status: DC

## 2022-01-30 MED ORDER — SODIUM CHLORIDE 0.9 % IV SOLN: 250.0000 mL | INTRAVENOUS | Status: DC

## 2022-01-30 NOTE — ED Provider Notes (Signed)
[place] EMERGENCY DEPARTMENT Provider Note  CSN: [id number] Arrival date & time: 02/10/2022 2542  Chief Complaint(s) Cardiac Arrest  HPI Alyssa Ingram is a 76 y.o. female with a past medical history listed below presents to the emergency department after cardiac arrest at home.  EMS was called out due to unresponsiveness.  When they arrived patient had agonal breathing and was pulseless.  CPR was initiated.  Initially had PEA arrest.  Patient was given 4 rounds of epi and placed on an epi drip.  CPR was performed for approximately 22 minutes before ROSC was obtained.  Since then patient has had strong pulses with regular rate and rhythm.  She was breathing on her own and biting at the [facility] airway, prompting EMS to give patient fentanyl and small dose of Versed.  CBG of 226.  I spoke with family after patient arrived in initial resuscitation was performed.  Family reported that the patient had not been feeling well over the past week.  She had been eating less than normal and has been unable to lie flat on her bed.  She had complained of foot pain a few days ago which resolved.  Also complaining of rib pain yesterday and then neck pain last night.  Patient was ambulatory up until last night and was able to walk from her bedroom to the living room.  While in the couch, around 2:45 AM patient was noted to have an abnormal breathing pattern.  This prompted the call to EMS.  Family reports that they were with her for most of the night as well.  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[place])    Echo 12/19/11: Severe LVH, EF 55-65%, dynamic obstruction, wall motion normal, grade 1 diastolic dysfunction, systolic anterior motion of the mitral valve with mild MS and mild MR, mild LAE, PASP 34   Hyperlipidemia    Hypertension    Kidney stones    Obesity    Type II or unspecified type diabetes mellitus without mention of complication, uncontrolled 01/31/2013   Patient Active Problem List   Diagnosis Date Noted   Travel advice encounter 07/17/2021   Acute on chronic heart failure ([place]) 06/19/2021   Acute respiratory failure with hypoxia ([place]) 06/19/2021   Retinal artery occlusion, 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   Vitamin D deficiency 10/02/2020   AKI (acute kidney injury) ([place]) 07/21/2020   Right elbow pain 05/06/2020   Throat pain 11/04/2019   Neuropathy 11/04/2019   Insomnia 03/09/2019   Daytime somnolence 07/17/2018   Paresthesia of bilateral legs 07/17/2018   Acute pharyngitis 07/17/2018   Greater trochanteric bursitis of left hip 01/30/2018   Low back pain 06/21/2017   Lateral epicondylitis of right elbow 06/20/2017   HLD (hyperlipidemia) 08/24/2016   Right shoulder pain 08/24/2016   Debility 08/10/2016   Viral illness 06/13/2016   Headache 12/09/2015   Degenerative arthritis of left knee 11/23/2015   Acute renal failure syndrome ([place]) 02/04/2015   Bilateral foot pain 01/26/2015  History of knee replacement procedure of right knee 08/06/2014   Arthritis of left lower extremity 08/06/2014   Cellulitis of left leg 04/14/2014   Acute bronchitis 04/14/2014   Chronic venous insufficiency 04/14/2014   Left leg pain 03/12/2014   Patellar tendonitis of left knee 07/31/2013   Superficial phlebitis 01/31/2013   Chronic pain of right knee 01/31/2013   Diabetes ([place]) 01/31/2013   Allergic rhinitis 01/31/2013   Hypertrophic obstructive cardiomyopathy ([place]) 05/03/2012   Left lumbar radiculopathy 11/22/2011   Right knee  pain 09/13/2011   Cough 09/13/2011   Arthritis    Mild persistent asthma    Cervical cancer ([place])    GERD (gastroesophageal reflux disease)    Hypertension    Kidney stones    CHF (congestive heart failure) ([place])    Encounter for well adult exam with abnormal findings 09/10/2011   Home Medication(s) Prior to Admission medications   Medication Sig Start Date End Date Taking? Authorizing Provider  albuterol (ACCUNEB) 0.63 MG/3ML nebulizer solution INHALE 1 VIAL BY MOUTH VIA NEBULIZATION EVERY 6 HOURS AS NEEDED FOR WHEEZING. Patient taking differently: Take 1 ampule by nebulization every 6 (six) hours as needed for wheezing or shortness of breath. 04/03/18   [name], MD  albuterol (PROAIR HFA) 108 [phone] Base) MCG/ACT inhaler Inhale 2 puffs into the lungs 2 (two) times daily. Patient taking differently: Inhale 2 puffs into the lungs 2 (two) times daily. May repeat if needed 06/13/16   [name], MD  allopurinol (ZYLOPRIM) 100 MG tablet TAKE 1 TABLET BY MOUTH EVERY DAY 08/26/21   [name], MD  ammonium lactate (AMLACTIN) 12 % cream APPLY TOPICALLY AS NEEDED FOR DRY SKIN. 05/16/18   Trula Slade, DPM  aspirin 81 MG tablet Take 162 mg by mouth daily.     [provider]  atorvastatin (LIPITOR) 80 MG tablet TAKE 1 TABLET BY MOUTH EVERY DAY 08/29/21   [name], MD  carvedilol (COREG) 25 MG tablet Take 1 tablet (25 mg total) by mouth 2 (two) times daily with a meal. 07/15/21 08/14/21  [name], MD  cetirizine (ZYRTEC) 10 MG tablet TAKE 1 TABLET BY MOUTH EVERY DAY AS NEEDED Patient taking differently: Take 10 mg by mouth daily. 11/17/20   [name], MD  Cholecalciferol (D3 ADULT PO) Take 1 tablet by mouth daily.    [provider]  Continuous Blood Gluc Receiver (DEXCOM G7 RECEIVER) DEVI Use as directed once daily E11.9 08/31/21   [name], MD  Continuous Blood Gluc Sensor (DEXCOM G7 SENSOR) MISC Use as directed every 10 days E11.9 12/09/21   [name],  MD  diltiazem (CARDIZEM CD) 180 MG 24 hr capsule Take 1 capsule (180 mg total) by mouth daily. 01/04/22 02/03/22  [name], MD  DULoxetine (CYMBALTA) 60 MG capsule TAKE 1 CAPSULE BY MOUTH EVERY DAY 08/26/21   [name], MD  furosemide (LASIX) 40 MG tablet Take 1 tablet (40 mg total) by mouth 2 (two) times daily. 07/15/21 08/14/21  [name], MD  glipiZIDE (GLUCOTROL XL) 2.5 MG 24 hr tablet Take 1 tablet (2.5 mg total) by mouth daily with breakfast. 07/28/21   [name], MD  Lancets MISC Use as directed three times daily E11.9 06/11/20   [name], MD  meclizine (ANTIVERT) 25 MG tablet TAKE 1 TABLET BY MOUTH THREE TIMES A DAY as needed Patient not taking: Reported on 12/30/2021 01/18/21   Cathlean Cower  W, MD  Multiple Vitamin (MULTIVITAMIN ADULT PO) Take 1 tablet by mouth daily.    [provider]  pantoprazole (PROTONIX) 40 MG tablet TAKE 1 TABLET BY MOUTH EVERY DAY 08/29/21   [name], MD  PFIZER COVID-19 [facility] BIVALENT injection  08/02/21   [provider]  triamcinolone (NASACORT AQ) 55 MCG/ACT AERO nasal inhaler Place 2 sprays into the nose daily. Patient taking differently: Place 2 sprays into the nose daily as needed (rhinitis). 12/31/18   [name], MD                                                                                                                                    Allergies Sulfa antibiotics, Metformin and related, Other, and Prednisone  Review of Systems Review of Systems As noted in HPI  Physical Exam Vital Signs  I have reviewed the triage vital signs BP (!) 96/57   Pulse 68   Temp 99.4 F (37.4 C) (Bladder)   Resp (!) 22   Ht 5' (1.524 m)   Wt 80 kg   SpO2 98%   BMI 34.44 kg/m   Physical Exam Vitals reviewed.  Constitutional:      General: She is in acute distress.     Appearance: She is well-developed. She is toxic-appearing.     Interventions: Backboard in place.  HENT:     Head: Normocephalic and atraumatic.      Nose: Nose normal.  Eyes:     General: No scleral icterus.       Right eye: No discharge.        Left eye: No discharge.     Conjunctiva/sclera: Conjunctivae normal.     Pupils: Pupils are equal, round, and reactive to light.  Cardiovascular:     Rate and Rhythm: Normal rate and regular rhythm.     Heart sounds: No murmur heard.    No friction rub. No gallop.  Pulmonary:     Effort: Pulmonary effort is normal. No respiratory distress.     Comments: King airway in place Abdominal:     General: There is no distension.     Palpations: Abdomen is soft.     Tenderness: There is no abdominal tenderness.  Musculoskeletal:        General: No tenderness.     Cervical back: Normal range of motion and neck supple.     Right lower leg: 3+ Edema present.     Left lower leg: 3+ Edema present.  Skin:    General: Skin is warm and dry.     Findings: No erythema or rash.  Neurological:     Mental Status: She is unresponsive.     ED Results and Treatments Labs (all labs ordered are listed, but only abnormal results are displayed) Labs Reviewed  COMPREHENSIVE METABOLIC PANEL - Abnormal; Notable for the following components:      Result Value  CO2 20 (*)    Glucose, Bld 217 (*)    BUN 28 (*)    Creatinine, Ser 1.52 (*)    Calcium 8.6 (*)    Albumin 2.4 (*)    AST 210 (*)    ALT 112 (*)    Total Bilirubin 1.6 (*)    GFR, Estimated 35 (*)    Anion gap 16 (*)    All other components within normal limits  LACTIC ACID, PLASMA - Abnormal; Notable for the following components:   Lactic Acid, Venous 7.3 (*)    All other components within normal limits  CBC - Abnormal; Notable for the following components:   WBC 25.9 (*)    RBC 3.42 (*)    Hemoglobin 9.3 (*)    HCT 28.5 (*)    RDW 16.7 (*)    All other components within normal limits  PROTIME-INR - Abnormal; Notable for the following components:   Prothrombin Time 16.8 (*)    INR 1.4 (*)    All other components within normal limits   PHOSPHORUS - Abnormal; Notable for the following components:   Phosphorus 5.6 (*)    All other components within normal limits  I-STAT ARTERIAL BLOOD GAS, ED - Abnormal; Notable for the following components:   pO2, Arterial 65 (*)    Potassium 3.4 (*)    Calcium, Ion 1.12 (*)    HCT 25.0 (*)    Hemoglobin 8.5 (*)    All other components within normal limits  TROPONIN I (HIGH SENSITIVITY) - Abnormal; Notable for the following components:   Troponin I (High Sensitivity) 94 (*)    All other components within normal limits  CULTURE, BLOOD (ROUTINE X 2)  CULTURE, BLOOD (ROUTINE X 2)  URINE CULTURE  CULTURE, RESPIRATORY W GRAM STAIN  APTT  MAGNESIUM  TRIGLYCERIDES  BLOOD GAS, ARTERIAL  LACTIC ACID, PLASMA  LACTIC ACID, PLASMA  LACTIC ACID, PLASMA  RAPID URINE DRUG SCREEN, HOSP PERFORMED  PROCALCITONIN  URINALYSIS, ROUTINE W REFLEX MICROSCOPIC  CBG MONITORING, ED  TYPE AND SCREEN  ABO/RH                                                                                                                         EKG  EKG Interpretation  Date/Time:  Monday January 30 2022 04:21:45 EDT Ventricular Rate:  65 PR Interval:  136 QRS Duration: 95 QT Interval:  454 QTC Calculation: 473 R Axis:   103 Text Interpretation: Sinus rhythm Ventricular premature complex Right axis deviation Consider left ventricular hypertrophy Borderline T abnormalities, inferior leads Baseline wander in lead(s) I aVR Confirmed by Addison Lank [phone]) on 01/16/2022 5:01:54 AM       Radiology DG Abd Portable 1V  Result Date: 01/20/2022 CLINICAL DATA:  76 year old female status post orogastric tube placement. EXAM: PORTABLE ABDOMEN - 1 VIEW COMPARISON:  No priors. FINDINGS: Tip of an enteric tube is noted in the proximal stomach with side port approximately 12 cm distal to  the gastroesophageal junction. Visualized bowel gas pattern is nonobstructive. IMPRESSION: 1. Orogastric tube located in the proximal  stomach, as above. Electronically Signed   By: Vinnie Langton M.D.   On: 01/28/2022 05:13   DG Chest Portable 1 View  Result Date: 01/29/2022 CLINICAL DATA:  Endotracheal tube placement. EXAM: PORTABLE CHEST 1 VIEW COMPARISON:  06/19/2021 FINDINGS: Heart is enlarged and the mediastinal contour is stable. Atherosclerotic calcification of the aorta is noted. The pulmonary vasculature is distended. Interstitial and airspace opacities are noted in the lungs bilaterally. No effusion or pneumothorax. The endotracheal tube terminates 2.5 cm above the carina. An enteric tube courses over the stomach and out of the field of view. IMPRESSION: 1. Cardiomegaly with pulmonary vascular congestion. 2. Interstitial and airspace opacities in the lungs bilaterally, possible edema or infiltrate. 3. Support apparatus as described above. Electronically Signed   By: Brett Fairy M.D.   On: 02/05/2022 04:50    Medications Ordered in ED Medications  propofol (DIPRIVAN) 1000 MG/100ML infusion (has no administration in time range)  norepinephrine (LEVOPHED) '4mg'$  in 235m (0.016 mg/mL) premix infusion (5 mcg/min Intravenous New Bag/Given 01/16/2022 0510)  fentaNYL (SUBLIMAZE) injection 25 mcg (has no administration in time range)  fentaNYL (SUBLIMAZE) injection 25-100 mcg (has no administration in time range)  midazolam (VERSED) injection 1 mg (has no administration in time range)  midazolam (VERSED) injection 1 mg (has no administration in time range)  vancomycin (VANCOREADY) IVPB 1500 mg/300 mL (has no administration in time range)  piperacillin-tazobactam (ZOSYN) IVPB 3.375 g (has no administration in time range)  etomidate (AMIDATE) injection (30 mg Intravenous Given 01/21/2022 0425)  rocuronium (ZEMURON) injection (100 mg Intravenous Given 01/14/2022 0426)  EPINEPHrine (ADRENALIN) 1 MG/10ML injection (1 mg Intravenous Given 01/29/2022 0431)  0.9 %  sodium chloride infusion (0 mLs Intravenous Stopped 01/21/2022 0511)  lactated  ringers bolus 1,000 mL (1,000 mLs Intravenous New Bag/Given 01/26/2022 0513)                                                                                                                                     Procedures .Critical Care  Performed by: CFatima Blank MD Authorized by: CFatima Blank MD   Critical care provider statement:    Critical care time (minutes):  45   Critical care time was exclusive of:  Separately billable procedures and treating other patients   Critical care was necessary to treat or prevent imminent or life-threatening deterioration of the following conditions:  Cardiac failure   Critical care was time spent personally by me on the following activities:  Development of treatment plan with patient or surrogate, discussions with consultants, evaluation of patient's response to treatment, examination of patient, obtaining history from patient or surrogate, review of old charts, re-evaluation of patient's condition, pulse oximetry, ordering and review of radiographic studies, ordering and review of laboratory studies and ordering and performing treatments and interventions  Care discussed with: admitting provider     (including critical care time)  Medical Decision Making / ED Course   Medical Decision Making Amount and/or Complexity of Data Reviewed Independent Historian: guardian and EMS Labs: ordered. Decision-making details documented in ED Course. Radiology: ordered and independent interpretation performed. Decision-making details documented in ED Course. ECG/medicine tests: ordered and independent interpretation performed. Decision-making details documented in ED Course.  Risk Prescription drug management. Parenteral controlled substances. Decision regarding hospitalization.    Cardiac arrest, reported PEA. Currently normal sinus rhythm. EKG with T wave inversions in the inferior leads.  No evidence of STEMI, dysrhythmias,  blocks.  Airway secured with ET tube due to patient's decreased mental status for airway protection. Patient required additional doses of epi for hypotension and placed on levo drip. Chest x-ray notable for pulmonary edema versus pneumonia. Diuretics held due to hypotension. Darted on empiric antibiotics for possible pneumonia  CBC with leukocytosis.  Anemia at 9.3, down from 10. Metabolic panel without significant electrolyte derangements.  Mild renal sufficiency.  Mild transaminitis.  Troponin slightly elevated. Lactic acid of 7.3.  Consult CC for admission for further work up and management      Final Clinical Impression(s) / ED Diagnoses Final diagnoses:  Cardiac arrest [facility])           This chart was dictated using voice recognition software.  Despite best efforts to proofread,  errors can occur which can change the documentation meaning.    Fatima Blank, MD 01/31/2022 0600

## 2022-01-30 NOTE — ED Notes (Signed)
Called RT for transporting pt upstairs to inpatient bed.

## 2022-01-30 NOTE — Progress Notes (Signed)
Pt transported to [facility] from [facility] without complications. Report given to RRT.

## 2022-01-30 NOTE — ED Provider Notes (Signed)
  Physical Exam  BP (!) 85/56   Pulse 65   Resp 13   SpO2 100%   Physical Exam  Procedures  Procedure Name: Intubation Date/Time: 01/29/2022 4:43 AM  Performed by: Emeline Darling, PA-CPre-anesthesia Checklist: Patient identified, Emergency Drugs available, Suction available, Patient being monitored and Timeout performed Oxygen Delivery Method: Ambu bag Preoxygenation: Pre-oxygenation with 100% oxygen Induction Type: Rapid sequence Ventilation: Mask ventilation without difficulty Laryngoscope Size: Glidescope and 4 Grade View: Grade I Tube size: 7.5 mm Number of attempts: 1 Airway Equipment and Method: Rigid stylet and Video-laryngoscopy Placement Confirmation: ETT inserted through vocal cords under direct vision Secured at: 21 cm Tube secured with: ETT holder Dental Injury: Teeth and Oropharynx as per pre-operative assessment  Comments: RSI with rocuronium 100 mg and etomidate 30 mg      ED Course / MDM    Medical Decision Making Amount and/or Complexity of Data Reviewed Radiology: ordered.  Risk Prescription drug management.   Patient presents post-CPR, under primary management of Dr. Leonette Monarch. Patient intubated by this provider, as above, under supervision of EDP.   This chart was dictated using voice recognition software, Dragon. Despite the best efforts of this provider to proofread and correct errors, errors may still occur which can change documentation meaning.       Emeline Darling, PA-C 02/10/2022 0446    Fatima Blank, MD 01/24/2022 [phone]

## 2022-01-30 NOTE — H&P (Addendum)
NAME:  Alyssa Ingram, MRN:  [id number], DOB:  1945-07-06, LOS: 0 ADMISSION DATE:  01/24/2022, CONSULTATION DATE:  02/09/2022 REFERRING MD:  Leonette Monarch, CHIEF COMPLAINT:  abdominal and neck pain, loss of consciousness  History of Present Illness:  76 yo woman with hx HFpEF, HOCM, asthma, GERD, HLD, HTN, Obesity, DMII, here after PEA cardiac arrest at home.    History is provided by son who lives with the patient.   She has been feeling poorly, including weakness and malaise for several days.  Chronic leg and foot pain was worse than usual.  This was followed by several days of abdominal discomfort, though no diarrhea or nausea or vomiting.  She was incontinent of stool once which is unusual for her.  9/17 she also developed neck pain.  She usually sleeps laying flat and was able to sleep Saturday night.  Since yesterday, they have seen her leaning over the sink and counter bracing herself.    No noticeable sob or cough.  Not complaining of dysuria.    She was feeling worse, becoming weaker and they had decided to take her to the ED, when she started to get worse and more altered.  911 called, and she had completely lost consciousness at that time. When ems arrived, breathing was agonal and she was pulseless. 22 min of cpr, rosc, king airway in the field.    In ED:  [facility] airway removed and intubated.  She was doing enough to fight the tube that she did receive doses of sedation in ed.  On levophed 32mg  LR 1 L ordered  Zosyn vanc ordered.  Sedation given to intubate, etomidate and rocuronium, as well as epi following intubation ('1mg'$ ) at around 430 am.   K 3.4 Cr 1.52 (nl 1.06) AST and ALT 210 and 112 t bili 1.6  Trop 95  BNP 154  Lactic 7.3  WBC 25  '[]'$ covid '[]'$ UA   Echo from 06/19/21  1. IVS thickneess ~24 mm at the basal septum. LVOT gradients not fully  assessed. HOCM. Left ventricular ejection fraction, by estimation, is 70  to 75%. The left ventricle has hyperdynamic function. The  left ventricle  has no regional wall motion  abnormalities. There is severe asymmetric left ventricular hypertrophy of  the basal-septal segment. Left ventricular diastolic parameters are  indeterminate.   2. Right ventricular systolic function is normal. The right ventricular  size is normal. There is severely elevated pulmonary artery systolic  pressure.   CT head read pending Pertinent  Medical History  HFpEF, HOCM, asthma, GERD, HLD, HTN, Obesity, DMII,   CXR B pulmonary vascular congestion and cardiomegaly .  Airspace opacities B  Significant Hospital Events: Including procedures, antibiotic start and stop dates in addition to other pertinent events     Interim History / Subjective:    Objective   Blood pressure (!) 96/57, pulse 68, temperature 99.4 F (37.4 C), temperature source Bladder, resp. rate (!) 22, height 5' (1.524 m), weight 80 kg, SpO2 98 %.    Vent Mode: PRVC FiO2 (%):  [60 %] 60 % Set Rate:  [22 bmp] 22 bmp Vt Set:  [400 mL] 400 mL PEEP:  [5 cmH20] 5 cmH20 Plateau Pressure:  [22 cmH20] 22 cmH20  No intake or output data in the 24 hours ending 02/10/2022 0615 Filed Weights   01/29/2022 0449  Weight: 80 kg    Examination: General: sedated, non responsive, no gag for me on oropharyngeal suctioning.   HENT: pupils minimally reactive  Lungs: rhonchi b  Cardiovascular: difficult to auscultate over rhonchi  Abdomen: Nd, NBS  Extremities: B edema and thickened skin  Neuro: non responsive no responsse to painful stimuli GU: foley  Resolved Hospital Problem list     Assessment & Plan:  Cardiac arrest, PEA  Hypotension B pulm edema, large a -a gradient.  CHF exacerbation likely vs infectious PNA.  Also has evidence of Pulm HTN On echo from 2/23.  Sepsis vs cardiogenic.  Ext are cool. Procal elevated.  WBC elevated.  BNP not very elevated overall picture most c/w CHF related cause.  Hx of HOCM.   Art line requested, when placed will trial lasix.  Currently  urinating well.  Goal net negative but limited by hypotension right now.  Currently map about 65 on cuff with levophed 64mg and somewhat labile.   Cont broad spectrum abtx.  Includign coverage for aspiration (quite rhonchorus).    Echo, f/u trop, f/u lactic pending.   EKG did not show ischemia as likely underlying cause of exacerbation.  Blood cx, urine strep and urine legionella, covid, flu and UA pending.   ABD pain: ct abd and pelvis ordered.  Lipase pending.  Possibly 2/2 edema in setting of chf.    AST/ALT elevated: mild, follow.   AKI: slightly elevated from baseline  DM: iss.   HTN: hold antiHTN meds.      Best Practice (right click and "Reselect all SmartList Selections" daily)   Diet/type: NPO DVT prophylaxis: prophylactic heparin  GI prophylaxis: PPI Lines: N/A Foley:  N/A Code Status:  full code Last date of multidisciplinary goals of care discussion '[]'$  Discussed plan with son CJuanda Crumbleat bedside.  Other children on the phone.    Labs   CBC: Recent Labs  Lab 01/23/2022 0425 01/21/2022 0552  WBC 25.9*  --   HGB 9.3* 8.5*  HCT 28.5* 25.0*  MCV 83.3  --   PLT 248  --     Basic Metabolic Panel: Recent Labs  Lab 02/01/2022 0425 01/21/2022 0552  NA 139 140  K 4.2 3.4*  CL 103  --   CO2 20*  --   GLUCOSE 217*  --   BUN 28*  --   CREATININE 1.52*  --   CALCIUM 8.6*  --   MG 2.1  --   PHOS 5.6*  --    GFR: Estimated Creatinine Clearance: 29.5 mL/min (A) (by C-G formula based on SCr of 1.52 mg/dL (H)). Recent Labs  Lab 01/18/2022 0425  PROCALCITON 2.55  WBC 25.9*  LATICACIDVEN 7.3*    Liver Function Tests: Recent Labs  Lab 01/20/2022 0425  AST 210*  ALT 112*  ALKPHOS 88  BILITOT 1.6*  PROT 7.1  ALBUMIN 2.4*   No results for input(s): "LIPASE", "AMYLASE" in the last 168 hours. No results for input(s): "AMMONIA" in the last 168 hours.  ABG    Component Value Date/Time   PHART 7.421 02/09/2022 0552   PCO2ART 33.5 02/07/2022 0552   PO2ART 65  (L) 01/15/2022 0552   HCO3 21.7 01/24/2022 0552   TCO2 23 02/09/2022 0552   ACIDBASEDEF 2.0 01/31/2022 0552   O2SAT 93 01/17/2022 0552     Coagulation Profile: Recent Labs  Lab 01/16/2022 0425  INR 1.4*    Cardiac Enzymes: No results for input(s): "CKTOTAL", "CKMB", "CKMBINDEX", "TROPONINI" in the last 168 hours.  HbA1C: Hgb A1c MFr Bld  Date/Time Value Ref Range Status  06/19/2021 01:36 PM 6.1 (H) 4.8 - 5.6 % Final  Comment:    (NOTE) Pre diabetes:          5.7%-6.4%  Diabetes:              >6.4%  Glycemic control for   <7.0% adults with diabetes   01/18/2021 05:15 PM 7.3 (H) 4.6 - 6.5 % Final    Comment:    Glycemic Control Guidelines for People with Diabetes:Non Diabetic:  <6%Goal of Therapy: <7%Additional Action Suggested:  >8%     CBG: No results for input(s): "GLUCAP" in the last 168 hours.  Review of Systems:   Unable to assess  Past Medical History:  She,  has a past medical history of Abnormal MRI, spine (11/2011), Allergic rhinitis, cause unspecified (01/31/2013), Arthritis, Asthma, Cervical cancer ([place]), Chronic diastolic heart failure ([place]), Diabetic retinopathy ([place]), GERD (gastroesophageal reflux disease), H/O cardiovascular stress test, HOCM (hypertrophic obstructive cardiomyopathy) ([place]), Hyperlipidemia, Hypertension, Kidney stones, Obesity, and Type II or unspecified type diabetes mellitus without mention of complication, uncontrolled (01/31/2013).   Surgical History:   Past Surgical History:  Procedure Laterality Date   ABDOMINAL HYSTERECTOMY     JOINT REPLACEMENT     right knee 2006   rotater cuff     right, 2005     Social History:   reports that she has never smoked. She has never used smokeless tobacco. She reports that she does not drink alcohol and does not use drugs.   Family History:  Her family history includes Alcohol abuse in an other family member; Arthritis in some other family members; Cancer in an other family member; Diabetes  in some other family members; Heart disease in her father and other family members; Hypertension in some other family members; Kidney disease in an other family member; Mental illness in an other family member; Sudden death in an other family member. There is no history of Colon cancer.   Allergies Allergies  Allergen Reactions   Sulfa Antibiotics Hives   Metformin And Related Other (See Comments)    intolerance   Other Hives    Unknown drug   Prednisone Swelling     Home Medications  Prior to Admission medications   Medication Sig Start Date End Date Taking? Authorizing Provider  albuterol (ACCUNEB) 0.63 MG/3ML nebulizer solution INHALE 1 VIAL BY MOUTH VIA NEBULIZATION EVERY 6 HOURS AS NEEDED FOR WHEEZING. Patient taking differently: Take 1 ampule by nebulization every 6 (six) hours as needed for wheezing or shortness of breath. 04/03/18   [name], MD  albuterol (PROAIR HFA) 108 [phone] Base) MCG/ACT inhaler Inhale 2 puffs into the lungs 2 (two) times daily. Patient taking differently: Inhale 2 puffs into the lungs 2 (two) times daily. May repeat if needed 06/13/16   [name], MD  allopurinol (ZYLOPRIM) 100 MG tablet TAKE 1 TABLET BY MOUTH EVERY DAY 08/26/21   [name], MD  ammonium lactate (AMLACTIN) 12 % cream APPLY TOPICALLY AS NEEDED FOR DRY SKIN. 05/16/18   Trula Slade, DPM  aspirin 81 MG tablet Take 162 mg by mouth daily.     [provider]  atorvastatin (LIPITOR) 80 MG tablet TAKE 1 TABLET BY MOUTH EVERY DAY 08/29/21   [name], MD  carvedilol (COREG) 25 MG tablet Take 1 tablet (25 mg total) by mouth 2 (two) times daily with a meal. 07/15/21 08/14/21  [name], MD  cetirizine (ZYRTEC) 10 MG tablet TAKE 1 TABLET BY MOUTH EVERY DAY AS NEEDED Patient taking differently: Take 10 mg by  mouth daily. 11/17/20   [name], MD  Cholecalciferol (D3 ADULT PO) Take 1 tablet by mouth daily.    [provider]  Continuous Blood Gluc Receiver (DEXCOM G7  RECEIVER) DEVI Use as directed once daily E11.9 08/31/21   [name], MD  Continuous Blood Gluc Sensor (DEXCOM G7 SENSOR) MISC Use as directed every 10 days E11.9 12/09/21   [name], MD  diltiazem (CARDIZEM CD) 180 MG 24 hr capsule Take 1 capsule (180 mg total) by mouth daily. 01/04/22 02/03/22  [name], MD  DULoxetine (CYMBALTA) 60 MG capsule TAKE 1 CAPSULE BY MOUTH EVERY DAY 08/26/21   [name], MD  furosemide (LASIX) 40 MG tablet Take 1 tablet (40 mg total) by mouth 2 (two) times daily. 07/15/21 08/14/21  [name], MD  glipiZIDE (GLUCOTROL XL) 2.5 MG 24 hr tablet Take 1 tablet (2.5 mg total) by mouth daily with breakfast. 07/28/21   [name], MD  Lancets MISC Use as directed three times daily E11.9 06/11/20   [name], MD  meclizine (ANTIVERT) 25 MG tablet TAKE 1 TABLET BY MOUTH THREE TIMES A DAY as needed Patient not taking: Reported on 12/30/2021 01/18/21   [name], MD  Multiple Vitamin (MULTIVITAMIN ADULT PO) Take 1 tablet by mouth daily.    [provider]  pantoprazole (PROTONIX) 40 MG tablet TAKE 1 TABLET BY MOUTH EVERY DAY 08/29/21   [name], MD  PFIZER COVID-19 [facility] BIVALENT injection  08/02/21   [provider]  triamcinolone (NASACORT AQ) 55 MCG/ACT AERO nasal inhaler Place 2 sprays into the nose daily. Patient taking differently: Place 2 sprays into the nose daily as needed (rhinitis). 12/31/18   [name], MD     Critical care time: 60 min

## 2022-01-30 NOTE — Progress Notes (Signed)
   01/27/2022 0605  Clinical Encounter Type  Visited With Patient and family together  Visit Type Initial;Other (Comment) (Cardica Arrest)  Referral From Nurse  Consult/Referral To Chaplain   Chaplain responded to a call for support of the family. The patient, Alyssa Ingram, had her son and her nephew with her at bedside and her daughter on the phone. The family asked that [facility] stay with them a while longer as they prayed for her health. The family were calmly interacting with their family member and with one another providing care and support.  As I departed I asked that God surround them in his peace. If a chaplain is requested someone will respond.   Danice Goltz  [facility]  [phone]

## 2022-01-30 NOTE — ED Triage Notes (Addendum)
Patient BIB [facility] post CPR.  EMS reports patient was c/o shoulder pain and upon their arrival, CPR was in progress by family.  ROSC enroute by EMS with movements and biting tube.  King airway in place.   22 mins CPR 4 epis, completed epi drip 50 mcg Fentanyl 2.5 mg Versed 18 G R AC 20 L AC CBG 226

## 2022-01-30 NOTE — ED Notes (Signed)
Patient's family at bedside, updated on plan of care and patient's status.

## 2022-01-30 NOTE — Progress Notes (Signed)
Peripherally Inserted Central Catheter Placement  The IV Nurse has discussed with the patient and/or persons authorized to consent for the patient, the purpose of this procedure and the potential benefits and risks involved with this procedure.  The benefits include less needle sticks, lab draws from the catheter, and the patient may be discharged home with the catheter. Risks include, but not limited to, infection, bleeding, blood clot (thrombus formation), and puncture of an artery; nerve damage and irregular heartbeat and possibility to perform a PICC exchange if needed/ordered by physician.  Alternatives to this procedure were also discussed.  Bard Power PICC patient education guide, fact sheet on infection prevention and patient information card has been provided to patient /or left at bedside.    PICC Placement Documentation  PICC Triple Lumen 02/06/2022 Right Brachial 37 cm 3 cm (Active)  Indication for Insertion or Continuance of Line Vasoactive infusions 02/10/2022 1438  Exposed Catheter (cm) 3 cm 01/29/2022 1438  Site Assessment Clean, Dry, Intact 01/25/2022 1438  Lumen #1 Status Flushed;Blood return noted 01/31/2022 1438  Lumen #2 Status Flushed;Saline locked;Blood return noted 01/19/2022 1438  Lumen #3 Status Flushed;Saline locked;Blood return noted 01/16/2022 1438  Dressing Type Transparent;Securing device 01/27/2022 1438  Dressing Status Antimicrobial disc in place 01/28/2022 1438  Safety Lock Not Applicable 46/96/29 5284  Dressing Change Due 02/06/22 01/24/2022 [place] 02/10/2022, 2:41 PM

## 2022-01-30 NOTE — ED Notes (Signed)
Attempted report x1. 

## 2022-01-30 NOTE — Progress Notes (Signed)
Pharmacy Antibiotic Note  Alyssa Ingram is a 76 y.o. female admitted on 02/11/2022 with sepsis.  Pharmacy has been consulted for Vanc and Zosyn dosing.  Vancomycin 750 mg IV Q 36 hrs. Goal AUC 400-550. Expected AUC: 539 SCr used: 1.52  Plan: Zosyn 3.375g IV q8h (4 hour infusion). Vanc 1500 mg IV x 1, then 750 mg IV q36hr Monitor renal function, C&S, clincal status and vanc levels as needed  Height: 5' (152.4 cm) Weight: 80 kg (176 lb 5.9 oz) IBW/kg (Calculated) : 45.5  Temp (24hrs), Avg:99.1 F (37.3 C), Min:99 F (37.2 C), Max:99.4 F (37.4 C)  Recent Labs  Lab 02/08/2022 0425  WBC 25.9*  CREATININE 1.52*  LATICACIDVEN 7.3*    Estimated Creatinine Clearance: 29.5 mL/min (A) (by C-G formula based on SCr of 1.52 mg/dL (H)).    Allergies  Allergen Reactions   Sulfa Antibiotics Hives   Metformin And Related Other (See Comments)    intolerance   Other Hives    Unknown drug   Prednisone Swelling    Antimicrobials this admission: Vanc 9/18 >>  Zosyn 9/18 >>   Thank you for allowing pharmacy to be a part of this patient's care.  Alanda Slim, PharmD, [facility] Clinical Pharmacist Please see AMION for all Pharmacists' Contact Phone Numbers 02/11/2022, 7:09 AM

## 2022-01-30 NOTE — Progress Notes (Signed)
Initial Nutrition Assessment  DOCUMENTATION CODES:  Not applicable  INTERVENTION:  Initiate tube feeding via OGT: Vital 1.2 at 55 ml/h (1320 ml per day) Start at 38m/h and advance by 135mh q6h to goal  Provides 1584 kcal, 99 gm protein, 1071 ml free water daily  MVI with minerals daily   NUTRITION DIAGNOSIS:  Inadequate oral intake related to inability to eat as evidenced by NPO status.  GOAL:  Provide needs based on ASPEN/SCCM guidelines  MONITOR:   Vent status, Labs, I & O's, Weight trends  REASON FOR ASSESSMENT:   Consult Enteral/tube feeding initiation and management  ASSESSMENT:   Pt with hx of GERD, HTN, HLD, CHF, DM type 2, and hx cervical cancer admitted after suffering cardiac arrest at home. Family began CPR prior to EMS arrival. 22 minutes before ROSC was obtained.  Admitted with ETT in place. Currently requiring pressor x 1, MAP WNL.  Family at bedside. States that appetite has been poor over the last few days PTA. Lives at home with her son at baseline and ambulates with a walker. Unsure of weight loss, state that pt's weight tends to fluctuate with fluid status. Unsure of UBW - after being discharge from [facility] last admission stayed ~171 lb.  Significant pitting edema present to BLE. Family reports swelling in BLE is normal for pt. Adipose tissue and fluid makes palpating some muscle groups difficult.  Patient is currently intubated on ventilator support OG tube in place (proximal stomach) MV: 8.6 L/min.  Temp (24hrs), Avg:99 F (37.2 C), Min:98.6 F (37 C), Max:99.4 F (37.4 C)   Intake/Output Summary (Last 24 hours) at 02/09/2022 1245 Last data filed at 02/08/2022 1107 Gross per 24 hour  Intake 1543.44 ml  Output --  Net 1543.44 ml  Net IO Since Admission: 1,543.44 mL [01/19/2022 1245]  Nutritionally Relevant Medications: Scheduled Meds:  insulin aspart  0-15 Units Subcutaneous Q4H   pantoprazole  40 mg Per Tube Daily   Continuous  Infusions:  azithromycin     norepinephrine (LEVOPHED) Adult infusion 9 mcg/min (01/28/2022 0900)   piperacillin-tazobactam (ZOSYN)  IV     propofol (DIPRIVAN) infusion     vancomycin 150 mL/hr at 01/25/2022 0900   PRN Meds: docusate, ondansetron, polyethylene glycol  Labs Reviewed: K 3.4 BUN 28, creatinine 1.52 Phosphorus 5.6 CBG ranges from 187-217 mg/dL over the last 24 hours HgbA1c 7.2% (9/18)  NUTRITION - FOCUSED PHYSICAL EXAM: Flowsheet Row Most Recent Value  Orbital Region No depletion  Upper Arm Region Mild depletion  Thoracic and Lumbar Region No depletion  Buccal Region No depletion  Temple Region No depletion  Clavicle Bone Region No depletion  Clavicle and Acromion Bone Region No depletion  Scapular Bone Region No depletion  Dorsal Hand Severe depletion  Patellar Region No depletion  Anterior Thigh Region No depletion  Posterior Calf Region No depletion  Edema (RD Assessment) Severe  [BLE, orbital]  Hair Reviewed  Eyes Reviewed  Mouth Reviewed  Skin Reviewed  Nails Reviewed    Diet Order:   Diet Order             Diet NPO time specified  Diet effective now                   EDUCATION NEEDS:   Not appropriate for education at this time  Skin:  Skin Assessment: Reviewed RN Assessment  Last BM:  prior to admission  Height:   Ht Readings from Last 1 Encounters:  01/14/2022 5' (  1.524 m)    Weight:   Wt Readings from Last 1 Encounters:  01/29/2022 80 kg    Ideal Body Weight:  45.5 kg  BMI:  Body mass index is 34.44 kg/m.  Estimated Nutritional Needs:  Kcal:  1500-1700 kcal/d Protein:  90-115 g/d Fluid:  1.5L/d    Ranell Patrick, RD, LDN Clinical Dietitian RD pager # available in AMION  After hours/weekend pager # available in [facility]

## 2022-01-31 DIAGNOSIS — I469 Cardiac arrest, cause unspecified: Secondary | ICD-10-CM | POA: Diagnosis not present

## 2022-01-31 LAB — BLOOD CULTURE ID PANEL (REFLEXED) - BCID2

## 2022-01-31 LAB — GLUCOSE, CAPILLARY
Glucose-Capillary: 229 mg/dL — ABNORMAL HIGH (ref 70–99)
Glucose-Capillary: 217 mg/dL — ABNORMAL HIGH (ref 70–99)
Glucose-Capillary: 160 mg/dL — ABNORMAL HIGH (ref 70–99)
Glucose-Capillary: 146 mg/dL — ABNORMAL HIGH (ref 70–99)
Glucose-Capillary: 148 mg/dL — ABNORMAL HIGH (ref 70–99)

## 2022-01-31 LAB — BASIC METABOLIC PANEL
Anion gap: 8 (ref 5–15)
BUN: 34 mg/dL — ABNORMAL HIGH (ref 8–23)
CO2: 25 mmol/L (ref 22–32)
Calcium: 8.1 mg/dL — ABNORMAL LOW (ref 8.9–10.3)
Chloride: 100 mmol/L (ref 98–111)
Creatinine, Ser: 1.72 mg/dL — ABNORMAL HIGH (ref 0.44–1.00)
GFR, Estimated: 30 mL/min — ABNORMAL LOW (ref >60)
Glucose, Bld: 327 mg/dL — ABNORMAL HIGH (ref 70–99)
Potassium: 3.5 mmol/L (ref 3.5–5.1)
Sodium: 133 mmol/L — ABNORMAL LOW (ref 135–145)

## 2022-01-31 LAB — CBC
HCT: 23.6 % — ABNORMAL LOW (ref 36.0–46.0)
Hemoglobin: 8.1 g/dL — ABNORMAL LOW (ref 12.0–15.0)
MCH: 27.5 pg (ref 26.0–34.0)
MCHC: 34.3 g/dL (ref 30.0–36.0)
MCV: 80 fL (ref 80.0–100.0)
Platelets: 189 10*3/uL (ref 150–400)
RBC: 2.95 MIL/uL — ABNORMAL LOW (ref 3.87–5.11)
RDW: 16.5 % — ABNORMAL HIGH (ref 11.5–15.5)
WBC: 21.3 10*3/uL — ABNORMAL HIGH (ref 4.0–10.5)
nRBC: 0 % (ref 0.0–0.2)

## 2022-01-31 LAB — MAGNESIUM: Magnesium: 1.8 mg/dL (ref 1.7–2.4)

## 2022-01-31 LAB — URINE CULTURE: Culture: NO GROWTH

## 2022-01-31 LAB — PHOSPHORUS: Phosphorus: 2.9 mg/dL (ref 2.5–4.6)

## 2022-01-31 MED ORDER — ACETAMINOPHEN 325 MG PO TABS
650.0000 mg | ORAL_TABLET | Freq: Four times a day (QID) | ORAL | Status: DC
  Administered 2022-01-31 – 2022-02-05 (×20): 650 mg
  Filled 2022-01-31 (×20): qty 2

## 2022-01-31 MED ORDER — FUROSEMIDE 10 MG/ML IJ SOLN
80.0000 mg | Freq: Two times a day (BID) | INTRAMUSCULAR | Status: AC
  Administered 2022-01-31 – 2022-02-03 (×6): 80 mg via INTRAVENOUS
  Filled 2022-01-31 (×6): qty 8

## 2022-01-31 MED ORDER — SODIUM CHLORIDE 0.9 % IV SOLN: 3.0000 g | Freq: Two times a day (BID) | INTRAVENOUS | Status: DC

## 2022-01-31 MED ORDER — LIDOCAINE 5 % EX PTCH
2.0000 | MEDICATED_PATCH | CUTANEOUS | Status: DC
  Administered 2022-01-31 – 2022-02-19 (×19): 2 via TRANSDERMAL
  Filled 2022-01-31 (×19): qty 2

## 2022-01-31 MED ORDER — FUROSEMIDE 10 MG/ML IJ SOLN
60.0000 mg | Freq: Once | INTRAMUSCULAR | Status: AC
  Administered 2022-01-31: 60 mg via INTRAVENOUS
  Filled 2022-01-31: qty 6

## 2022-01-31 MED ORDER — SODIUM CHLORIDE 0.9 % IV SOLN
3.0000 g | Freq: Two times a day (BID) | INTRAVENOUS | Status: DC
  Administered 2022-01-31 – 2022-02-03 (×6): 3 g via INTRAVENOUS
  Filled 2022-01-31 (×6): qty 8

## 2022-01-31 MED ORDER — POTASSIUM CHLORIDE 20 MEQ PO PACK
40.0000 meq | PACK | Freq: Once | ORAL | Status: AC
  Administered 2022-01-31: 40 meq
  Filled 2022-01-31: qty 2

## 2022-01-31 MED ORDER — OXYCODONE HCL 5 MG PO TABS
5.0000 mg | ORAL_TABLET | Freq: Four times a day (QID) | ORAL | Status: DC | PRN
  Administered 2022-01-31 – 2022-02-01 (×3): 5 mg
  Filled 2022-01-31 (×3): qty 1

## 2022-01-31 MED ORDER — INSULIN ASPART 100 UNIT/ML IJ SOLN
5.0000 [IU] | INTRAMUSCULAR | Status: DC
  Administered 2022-01-31 – 2022-02-10 (×43): 5 [IU] via SUBCUTANEOUS

## 2022-01-31 NOTE — Progress Notes (Signed)
PHARMACY - PHYSICIAN COMMUNICATION CRITICAL VALUE ALERT - BLOOD CULTURE IDENTIFICATION (BCID)  Alyssa Ingram is an 76 y.o. female who presented to [facility] on 01/29/2022 with a chief complaint of weakness, s/p cardiac arrest in ED   Name of physician (or Provider) Contacted: none  Current antibiotics: started on zosyn and vancomycin - will cover enterococcus   Changes to prescribed antibiotics recommended: no changes needed  Patient is on recommended antibiotics - No changes needed  Results for orders placed or performed during the hospital encounter of 01/20/2022  Blood Culture ID Panel (Reflexed) (Collected: 01/27/2022  5:45 AM)  Result Value Ref Range   Enterococcus faecalis DETECTED (A) NOT DETECTED   Enterococcus Faecium NOT DETECTED NOT DETECTED   Listeria monocytogenes NOT DETECTED NOT DETECTED   Staphylococcus species NOT DETECTED NOT DETECTED   Staphylococcus aureus (BCID) NOT DETECTED NOT DETECTED   Staphylococcus epidermidis NOT DETECTED NOT DETECTED   Staphylococcus lugdunensis NOT DETECTED NOT DETECTED   Streptococcus species NOT DETECTED NOT DETECTED   Streptococcus agalactiae NOT DETECTED NOT DETECTED   Streptococcus pneumoniae NOT DETECTED NOT DETECTED   Streptococcus pyogenes NOT DETECTED NOT DETECTED   A.calcoaceticus-baumannii NOT DETECTED NOT DETECTED   Bacteroides fragilis NOT DETECTED NOT DETECTED   Enterobacterales NOT DETECTED NOT DETECTED   Enterobacter cloacae complex NOT DETECTED NOT DETECTED   Escherichia coli NOT DETECTED NOT DETECTED   Klebsiella aerogenes NOT DETECTED NOT DETECTED   Klebsiella oxytoca NOT DETECTED NOT DETECTED   Klebsiella pneumoniae NOT DETECTED NOT DETECTED   Proteus species NOT DETECTED NOT DETECTED   Salmonella species NOT DETECTED NOT DETECTED   Serratia marcescens NOT DETECTED NOT DETECTED   Haemophilus influenzae NOT DETECTED NOT DETECTED   Neisseria meningitidis NOT DETECTED NOT DETECTED   Pseudomonas aeruginosa NOT  DETECTED NOT DETECTED   Stenotrophomonas maltophilia NOT DETECTED NOT DETECTED   Candida albicans NOT DETECTED NOT DETECTED   Candida auris NOT DETECTED NOT DETECTED   Candida glabrata NOT DETECTED NOT DETECTED   Candida krusei NOT DETECTED NOT DETECTED   Candida parapsilosis NOT DETECTED NOT DETECTED   Candida tropicalis NOT DETECTED NOT DETECTED   Cryptococcus neoformans/gattii NOT DETECTED NOT DETECTED   Vancomycin resistance NOT DETECTED NOT DETECTED      Bonnita Nasuti Pharm.D. CPP, BCPS Clinical Pharmacist [phone] 01/31/2022 2:56 AM

## 2022-01-31 NOTE — Consult Note (Signed)
[facility] for Infectious Disease  Total days of antibiotics 2 Reason for Consult:enterococcal bacteremia    Referring Physician: auto consult  Principal Problem:   Cardiac arrest [facility])    HPI: Alyssa Ingram is a 76 y.o. female with hx of HTN, HLD, HOCM with dHF, DM, admitted on 9/18 after losing consciousness and EMS found her to have agonal breathing and being pulselss, requiring 54mof CPR. She was reported to feeling poorly, with some abdominal pain as well as neck pain per her son's report. She remains intubated but alert and answer to simple questions. In additiona to her PEA cardiac arrest, cardiogenic shock and pulmonary edema, Her infectious work up revealed enterococcal bacteremia. She was started on piptazo and azithro for pneumonia and as part of sepsis work up. Still requiring ventilatory for increase work of breathing/pulm edema.   Past Medical History:  Diagnosis Date   Abnormal MRI, spine 11/2011   ?discitis L5-S1, s/p CT bx 12/12/11   Allergic rhinitis, cause unspecified 01/31/2013   Arthritis    Asthma    Cervical cancer (HCC)    Chronic diastolic heart failure (HCC)    Diabetic retinopathy (HCC)    GERD (gastroesophageal reflux disease)    H/O cardiovascular stress test    Nuclear study in 2011 normal perfusion   HOCM (hypertrophic obstructive cardiomyopathy) ([place]    Echo 12/19/11: Severe LVH, EF 55-65%, dynamic obstruction, wall motion normal, grade 1 diastolic dysfunction, systolic anterior motion of the mitral valve with mild MS and mild MR, mild LAE, PASP 34   Hyperlipidemia    Hypertension    Kidney stones    Obesity    Type II or unspecified type diabetes mellitus without mention of complication, uncontrolled 01/31/2013    Allergies:  Allergies  Allergen Reactions   Sulfa Antibiotics Hives   Metformin And Related Other (See Comments)    intolerance   Prednisone Swelling    Current antibiotics:   MEDICATIONS:  acetaminophen  650 mg Per Tube  Q6H   Chlorhexidine Gluconate Cloth  6 each Topical Daily   furosemide  80 mg Intravenous BID   heparin  5,000 Units Subcutaneous Q8H   insulin aspart  0-15 Units Subcutaneous Q4H   insulin aspart  5 Units Subcutaneous Q4H   ipratropium-albuterol  3 mL Nebulization Q6H   lidocaine  2 patch Transdermal Q24H   multivitamin  15 mL Per Tube Daily   mouth rinse  15 mL Mouth Rinse Q2H   pantoprazole  40 mg Per Tube Daily   sodium chloride flush  10-40 mL Intracatheter Q12H    Social History   Tobacco Use   Smoking status: Never   Smokeless tobacco: Never  Substance Use Topics   Alcohol use: No    Alcohol/week: 0.0 standard drinks of alcohol   Drug use: No    Family History  Problem Relation Age of Onset   Heart disease Father    Arthritis Other    Heart disease Other    Hypertension Other    Diabetes Other    Alcohol abuse Other    Arthritis Other    Cancer Other        Lung Cancer   Heart disease Other    Hypertension Other    Sudden death Other    Kidney disease Other    Mental illness Other    Diabetes Other    Colon cancer Neg Hx     Review of Systems -  Unable to  obtain since intubated   OBJECTIVE: Temp:  [98.8 F (37.1 C)-100.4 F (38 C)] 98.8 F (37.1 C) (09/19 1700) Pulse Rate:  [64-80] 71 (09/19 1700) Resp:  [13-36] 25 (09/19 1700) BP: (89-136)/(50-125) 102/63 (09/19 1700) SpO2:  [89 %-99 %] 95 % (09/19 1700) FiO2 (%):  [40 %-50 %] 40 % (09/19 1507) Weight:  [80 kg] 80 kg (09/19 0500) Physical Exam  Constitutional:  oriented to person, place, and time. appears well-developed and well-nourished. No distress.  HENT: [place]/AT, PERRLA, no scleral icterus Mouth/Throat: Oropharynx is clear and moist. No oropharyngeal exudate.  Cardiovascular: Normal rate, regular rhythm and normal heart sounds. Exam reveals no gallop and no friction rub.  No murmur heard.  Pulmonary/Chest: Effort normal and breath sounds normal. No respiratory distress.  has no wheezes.   Neck = supple, no nuchal rigidity Abdominal: Soft. Bowel sounds are normal.  exhibits no distension. There is no tenderness.  Lymphadenopathy: no cervical adenopathy. No axillary adenopathy Neurological: alert and oriented to person, place, and time.  Skin: Skin is warm and dry. Edema/lymphedema    LABS: Results for orders placed or performed during the hospital encounter of 01/26/2022 (from the past 48 hour(s))  Comprehensive metabolic panel     Status: Abnormal   Collection Time: 01/28/2022  4:25 AM  Result Value Ref Range   Sodium 139 135 - 145 mmol/L   Potassium 4.2 3.5 - 5.1 mmol/L   Chloride 103 98 - 111 mmol/L   CO2 20 (L) 22 - 32 mmol/L   Glucose, Bld 217 (H) 70 - 99 mg/dL    Comment: Glucose reference range applies only to samples taken after fasting for at least 8 hours.   BUN 28 (H) 8 - 23 mg/dL   Creatinine, Ser 1.52 (H) 0.44 - 1.00 mg/dL   Calcium 8.6 (L) 8.9 - 10.3 mg/dL   Total Protein 7.1 6.5 - 8.1 g/dL   Albumin 2.4 (L) 3.5 - 5.0 g/dL   AST 210 (H) 15 - 41 U/L   ALT 112 (H) 0 - 44 U/L   Alkaline Phosphatase 88 38 - 126 U/L   Total Bilirubin 1.6 (H) 0.3 - 1.2 mg/dL   GFR, Estimated 35 (L) >60 mL/min    Comment: (NOTE) Calculated using the CKD-EPI Creatinine Equation (2021)    Anion gap 16 (H) 5 - 15    Comment: Performed at [facility], [place] [address], [place], [place] [address]  Lactic acid, plasma     Status: Abnormal   Collection Time: 01/26/2022  4:25 AM  Result Value Ref Range   Lactic Acid, Venous 7.3 (HH) 0.5 - 1.9 mmol/L    Comment: CRITICAL RESULT CALLED TO, READ BACK BY AND VERIFIED WITH Otho Perl RN 02/09/2022 [place] Performed at [facility], [place] [address], [place], [place] [address]   CBC     Status: Abnormal   Collection Time: 01/26/2022  4:25 AM  Result Value Ref Range   WBC 25.9 (H) 4.0 - 10.5 K/uL   RBC 3.42 (L) 3.87 - 5.11 MIL/uL   Hemoglobin 9.3 (L) 12.0 - 15.0 g/dL   HCT 28.5 (L) 36.0 - 46.0 %   MCV 83.3 80.0 -  100.0 fL   MCH 27.2 26.0 - 34.0 pg   MCHC 32.6 30.0 - 36.0 g/dL   RDW 16.7 (H) 11.5 - 15.5 %   Platelets 248 150 - 400 K/uL   nRBC 0.0 0.0 - 0.2 %    Comment: Performed  at [facility], [place] [address], [place], [place] [address]  APTT     Status: None   Collection Time: 01/29/2022  4:25 AM  Result Value Ref Range   aPTT 30 24 - 36 seconds    Comment: Performed at [facility] [address], [place], [place] [address]  Protime-INR     Status: Abnormal   Collection Time: 01/18/2022  4:25 AM  Result Value Ref Range   Prothrombin Time 16.8 (H) 11.4 - 15.2 seconds   INR 1.4 (H) 0.8 - 1.2    Comment: (NOTE) INR goal varies based on device and disease states. Performed at [facility], [place] [address], [place], [place] [address]   Magnesium     Status: None   Collection Time: 02/01/2022  4:25 AM  Result Value Ref Range   Magnesium 2.1 1.7 - 2.4 mg/dL    Comment: Performed at [facility] [address] [place], [place] [address]  Phosphorus     Status: Abnormal   Collection Time: 02/10/2022  4:25 AM  Result Value Ref Range   Phosphorus 5.6 (H) 2.5 - 4.6 mg/dL    Comment: Performed at [facility] [address], [place], [place] [address]  Troponin I (High Sensitivity)     Status: Abnormal   Collection Time: 02/07/2022  4:25 AM  Result Value Ref Range   Troponin I (High Sensitivity) 94 (H) <18 ng/L    Comment: (NOTE) Elevated high sensitivity troponin I (hsTnI) values and significant  changes across serial measurements may suggest ACS but many other  chronic and acute conditions are known to elevate hsTnI results.  Refer to the "Links" section for chest pain algorithms and additional  guidance. Performed at [facility], [place] [address], [place], [place] [address]   Procalcitonin     Status: None   Collection Time: 02/08/2022  4:25 AM  Result Value Ref Range   Procalcitonin 2.55 ng/mL    Comment:        Interpretation: PCT > 2 ng/mL: Systemic  infection (sepsis) is likely, unless other causes are known. (NOTE)       Sepsis PCT Algorithm           Lower Respiratory Tract                                      Infection PCT Algorithm    ----------------------------     ----------------------------         PCT < 0.25 ng/mL                PCT < 0.10 ng/mL          Strongly encourage             Strongly discourage   discontinuation of antibiotics    initiation of antibiotics    ----------------------------     -----------------------------       PCT 0.25 - 0.50 ng/mL            PCT 0.10 - 0.25 ng/mL               OR       >80% decrease in PCT            Discourage initiation of  antibiotics      Encourage discontinuation           of antibiotics    ----------------------------     -----------------------------         PCT >= 0.50 ng/mL              PCT 0.26 - 0.50 ng/mL               AND       <80% decrease in PCT              Encourage initiation of                                             antibiotics       Encourage continuation           of antibiotics    ----------------------------     -----------------------------        PCT >= 0.50 ng/mL                  PCT > 0.50 ng/mL               AND         increase in PCT                  Strongly encourage                                      initiation of antibiotics    Strongly encourage escalation           of antibiotics                                     -----------------------------                                           PCT <= 0.25 ng/mL                                                 OR                                        > 80% decrease in PCT                                      Discontinue / Do not initiate                                             antibiotics  Performed at [facility], [place] [address], [place], [place] [address]   Triglycerides     Status: None   Collection Time: 01/17/2022  4:25 AM   Result Value Ref Range   Triglycerides 89 <150 mg/dL    Comment: Performed at [facility] [address], [place], [place] [address]  ABO/Rh     Status: None   Collection Time: 01/29/2022  4:25 AM  Result Value Ref Range   ABO/RH(D)      O POS Performed at [facility] [address], [place], [place] [address]   Urine Culture     Status: None   Collection Time: 01/16/2022  4:59 AM   Specimen: Urine, Catheterized  Result Value Ref Range   Specimen Description URINE, CATHETERIZED    Special Requests NONE    Culture      NO GROWTH Performed at [facility], [place] [address], [place], [place] [address]    Report Status 01/31/2022 FINAL   Type and screen [place]     Status: None   Collection Time: 02/02/2022  5:43 AM  Result Value Ref Range   ABO/RH(D) O POS    Antibody Screen NEG    Sample Expiration      02/02/2022,2359 Performed at [facility], [place] [address], [place], [place] [address]   Culture, blood (Routine X 2) w Reflex to ID Panel     Status: None (Preliminary result)   Collection Time: 02/10/2022  5:43 AM   Specimen: BLOOD LEFT HAND  Result Value Ref Range   Specimen Description BLOOD LEFT HAND    Special Requests AEROBIC BOTTLE ONLY Blood Culture adequate volume    Culture  Setup Time      GRAM POSITIVE COCCI IN CHAINS CORRECTED RESULTS PREVIOUSLY REPORTED AS: GRAM NEGATIVE COCCI IN PAIRS CORRECTED RESULTS CALLED TO: L CURRAN,PHARMD'@0238'$  01/31/22 [place] AEROBIC BOTTLE ONLY CRITICAL VALUE NOTED.  VALUE IS CONSISTENT WITH PREVIOUSLY REPORTED AND CALLED VALUE.    Culture      NO GROWTH 1 DAY Performed at [facility], [place] [address], [place], [place] [address]    Report Status PENDING   Culture, blood (Routine X 2) w Reflex to ID Panel     Status: Abnormal (Preliminary result)   Collection Time: 01/15/2022  5:45 AM   Specimen: BLOOD LEFT HAND  Result Value Ref Range   Specimen Description BLOOD LEFT HAND    Special  Requests AEROBIC BOTTLE ONLY Blood Culture adequate volume    Culture  Setup Time      CORRECTED RESULTS GRAM POSITIVE COCCI IN CHAINS PREVIOUSLY REPORTED AS: GRAM NEGATIVE COCCI IN PAIRS CORRECTED RESULTS CALLED TO: L CURRAN,PHARMD'@0238'$  01/31/22 [place] BOTTLES DRAWN AEROBIC ONLY Organism ID to follow    Culture (A)     ENTEROCOCCUS FAECALIS CULTURE REINCUBATED FOR BETTER GROWTH Performed at [facility], [place] [address] [place], [place] [address]    Report Status PENDING   Blood Culture ID Panel (Reflexed)     Status: Abnormal   Collection Time: 01/29/2022  5:45 AM  Result Value Ref Range   Enterococcus faecalis DETECTED (A) NOT DETECTED    Comment: CRITICAL RESULT CALLED TO, READ BACK BY AND VERIFIED WITH: L CURRAN,PHARMD'@0238'$  01/31/22 [place]    Enterococcus Faecium NOT DETECTED NOT DETECTED   Listeria monocytogenes NOT DETECTED NOT DETECTED   Staphylococcus species NOT DETECTED NOT DETECTED   Staphylococcus aureus (BCID) NOT DETECTED NOT DETECTED   Staphylococcus epidermidis NOT DETECTED NOT DETECTED   Staphylococcus lugdunensis NOT DETECTED NOT DETECTED   Streptococcus species NOT DETECTED NOT DETECTED   Streptococcus  agalactiae NOT DETECTED NOT DETECTED   Streptococcus pneumoniae NOT DETECTED NOT DETECTED   Streptococcus pyogenes NOT DETECTED NOT DETECTED   A.calcoaceticus-baumannii NOT DETECTED NOT DETECTED   Bacteroides fragilis NOT DETECTED NOT DETECTED   Enterobacterales NOT DETECTED NOT DETECTED   Enterobacter cloacae complex NOT DETECTED NOT DETECTED   Escherichia coli NOT DETECTED NOT DETECTED   Klebsiella aerogenes NOT DETECTED NOT DETECTED   Klebsiella oxytoca NOT DETECTED NOT DETECTED   Klebsiella pneumoniae NOT DETECTED NOT DETECTED   Proteus species NOT DETECTED NOT DETECTED   Salmonella species NOT DETECTED NOT DETECTED   Serratia marcescens NOT DETECTED NOT DETECTED   Haemophilus influenzae NOT DETECTED NOT DETECTED   Neisseria meningitidis NOT DETECTED NOT  DETECTED   Pseudomonas aeruginosa NOT DETECTED NOT DETECTED   Stenotrophomonas maltophilia NOT DETECTED NOT DETECTED   Candida albicans NOT DETECTED NOT DETECTED   Candida auris NOT DETECTED NOT DETECTED   Candida glabrata NOT DETECTED NOT DETECTED   Candida krusei NOT DETECTED NOT DETECTED   Candida parapsilosis NOT DETECTED NOT DETECTED   Candida tropicalis NOT DETECTED NOT DETECTED   Cryptococcus neoformans/gattii NOT DETECTED NOT DETECTED   Vancomycin resistance NOT DETECTED NOT DETECTED    Comment: Performed at [facility], [place] [address], [place], [place] [address]  I-Stat arterial blood gas, ED     Status: Abnormal   Collection Time: 02/05/2022  5:52 AM  Result Value Ref Range   pH, Arterial 7.421 7.35 - 7.45   pCO2 arterial 33.5 32 - 48 mmHg   pO2, Arterial 65 (L) 83 - 108 mmHg   Bicarbonate 21.7 20.0 - 28.0 mmol/L   TCO2 23 22 - 32 mmol/L   O2 Saturation 93 %   Acid-base deficit 2.0 0.0 - 2.0 mmol/L   Sodium 140 135 - 145 mmol/L   Potassium 3.4 (L) 3.5 - 5.1 mmol/L   Calcium, Ion 1.12 (L) 1.15 - 1.40 mmol/L   HCT 25.0 (L) 36.0 - 46.0 %   Hemoglobin 8.5 (L) 12.0 - 15.0 g/dL   Patient temperature 99.4 F    Sample type ARTERIAL   Strep pneumoniae urinary antigen     Status: None   Collection Time: 02/09/2022  7:26 AM  Result Value Ref Range   Strep Pneumo Urinary Antigen NEGATIVE NEGATIVE    Comment:        Infection due to S. pneumoniae cannot be absolutely ruled out since the antigen present may be below the detection limit of the test. Performed at [facility], [address], [place], [place] [address]   Rapid urine drug screen (hospital performed)     Status: Abnormal   Collection Time: 01/17/2022  8:08 AM  Result Value Ref Range   Opiates NONE DETECTED NONE DETECTED   Cocaine NONE DETECTED NONE DETECTED   Benzodiazepines POSITIVE (A) NONE DETECTED   Amphetamines NONE DETECTED NONE DETECTED   Tetrahydrocannabinol NONE DETECTED NONE DETECTED    Barbiturates NONE DETECTED NONE DETECTED    Comment: (NOTE) DRUG SCREEN FOR MEDICAL PURPOSES ONLY.  IF CONFIRMATION IS NEEDED FOR ANY PURPOSE, NOTIFY LAB WITHIN 5 DAYS.  LOWEST DETECTABLE LIMITS FOR URINE DRUG SCREEN Drug Class                     Cutoff (ng/mL) Amphetamine and metabolites    1000 Barbiturate and metabolites    200 Benzodiazepine                 200  Tricyclics and metabolites     300 Opiates and metabolites        300 Cocaine and metabolites        300 THC                            50 Performed at [facility], [place] [address] [place], [place] [address]   Urinalysis, Routine w reflex microscopic Urine, Catheterized     Status: Abnormal   Collection Time: 01/27/2022  8:08 AM  Result Value Ref Range   Color, Urine AMBER (A) YELLOW    Comment: BIOCHEMICALS MAY BE AFFECTED BY COLOR   APPearance CLOUDY (A) CLEAR   Specific Gravity, Urine 1.012 1.005 - 1.030   pH 5.0 5.0 - 8.0   Glucose, UA 150 (A) NEGATIVE mg/dL   Hgb urine dipstick SMALL (A) NEGATIVE   Bilirubin Urine NEGATIVE NEGATIVE   Ketones, ur NEGATIVE NEGATIVE mg/dL   Protein, ur 100 (A) NEGATIVE mg/dL   Nitrite NEGATIVE NEGATIVE   Leukocytes,Ua NEGATIVE NEGATIVE   RBC / HPF 11-20 0 - 5 RBC/hpf   Bacteria, UA MANY (A) NONE SEEN   Squamous Epithelial / LPF 11-20 0 - 5   Mucus PRESENT    Granular Casts, UA PRESENT    Non Squamous Epithelial 0-5 (A) NONE SEEN    Comment: Performed at [facility], [place] [address] [place], [place] [address]  Glucose, capillary     Status: Abnormal   Collection Time: 01/27/2022  8:24 AM  Result Value Ref Range   Glucose-Capillary 187 (H) 70 - 99 mg/dL    Comment: Glucose reference range applies only to samples taken after fasting for at least 8 hours.  MRSA Next Gen by PCR, Nasal     Status: None   Collection Time: 01/22/2022  8:40 AM   Specimen: Nasal Mucosa; Nasal Swab  Result Value Ref Range   MRSA by PCR Next Gen NOT DETECTED NOT DETECTED    Comment:  (NOTE) The GeneXpert MRSA Assay (FDA approved for NASAL specimens only), is one component of a comprehensive MRSA colonization surveillance program. It is not intended to diagnose MRSA infection nor to guide or monitor treatment for MRSA infections. Test performance is not FDA approved in patients less than 48 years old. Performed at [facility], [place] [address], [place], [place] [address]   CBC     Status: Abnormal   Collection Time: 01/29/2022  8:50 AM  Result Value Ref Range   WBC 23.5 (H) 4.0 - 10.5 K/uL   RBC 3.69 (L) 3.87 - 5.11 MIL/uL   Hemoglobin 9.8 (L) 12.0 - 15.0 g/dL   HCT 30.1 (L) 36.0 - 46.0 %   MCV 81.6 80.0 - 100.0 fL   MCH 26.6 26.0 - 34.0 pg   MCHC 32.6 30.0 - 36.0 g/dL   RDW 16.5 (H) 11.5 - 15.5 %   Platelets 224 150 - 400 K/uL   nRBC 0.0 0.0 - 0.2 %    Comment: Performed at [facility], [place] [address], [place], [place] [address]  Lactic acid, plasma     Status: Abnormal   Collection Time: 02/05/2022  8:50 AM  Result Value Ref Range   Lactic Acid, Venous 3.2 (HH) 0.5 - 1.9 mmol/L    Comment: CRITICAL VALUE NOTED. VALUE IS CONSISTENT WITH PREVIOUSLY REPORTED/CALLED VALUE Performed at [facility], [place] [address] [place], [place] [address]  TSH     Status: None   Collection Time: 01/14/2022  8:50 AM  Result Value Ref Range   TSH 0.961 0.350 - 4.500 uIU/mL    Comment: Performed by a 3rd Generation assay with a functional sensitivity of <=0.01 uIU/mL. Performed at [facility], [place] [address], [place], [place] [address]   Hemoglobin A1c     Status: Abnormal   Collection Time: 01/29/2022  8:50 AM  Result Value Ref Range   Hgb A1c MFr Bld 7.2 (H) 4.8 - 5.6 %    Comment: (NOTE) Pre diabetes:          5.7%-6.4%  Diabetes:              >6.4%  Glycemic control for   <7.0% adults with diabetes    Mean Plasma Glucose 159.94 mg/dL    Comment: Performed at [facility] [address], [place], [place] [address]  Troponin I (High  Sensitivity)     Status: Abnormal   Collection Time: 02/11/2022  8:50 AM  Result Value Ref Range   Troponin I (High Sensitivity) 332 (HH) <18 ng/L    Comment: CRITICAL RESULT CALLED TO, READ BACK BY AND VERIFIED WITH C.HINSHAW,RN 1009 01/23/2022 [place],S (NOTE) Elevated high sensitivity troponin I (hsTnI) values and significant  changes across serial measurements may suggest ACS but many other  chronic and acute conditions are known to elevate hsTnI results.  Refer to the "Links" section for chest pain algorithms and additional  guidance. Performed at [facility], [place] [address], [place], [place] [address]   Lipase, blood     Status: None   Collection Time: 02/07/2022  8:50 AM  Result Value Ref Range   Lipase 25 11 - 51 U/L    Comment: Performed at [facility] [address] [place], [place] [address]  Brain natriuretic peptide     Status: Abnormal   Collection Time: 01/18/2022  8:50 AM  Result Value Ref Range   B Natriuretic Peptide 835.7 (H) 0.0 - 100.0 pg/mL    Comment: Performed at [facility] [address] [place], [place] [address]  Lactic acid, plasma     Status: Abnormal   Collection Time: 02/09/2022 11:49 AM  Result Value Ref Range   Lactic Acid, Venous 2.0 (HH) 0.5 - 1.9 mmol/L    Comment: CRITICAL VALUE NOTED. VALUE IS CONSISTENT WITH PREVIOUSLY REPORTED/CALLED VALUE Performed at [facility], [place] [address] [place], [place] [address]   Glucose, capillary     Status: Abnormal   Collection Time: 01/29/2022 11:56 AM  Result Value Ref Range   Glucose-Capillary 188 (H) 70 - 99 mg/dL    Comment: Glucose reference range applies only to samples taken after fasting for at least 8 hours.  Lactic acid, plasma     Status: None   Collection Time: 01/14/2022  3:22 PM  Result Value Ref Range   Lactic Acid, Venous 1.0 0.5 - 1.9 mmol/L    Comment: Performed at [facility], [place] [address], [place], [place] [address]  Magnesium     Status: None   Collection  Time: 02/06/2022  3:22 PM  Result Value Ref Range   Magnesium 1.9 1.7 - 2.4 mg/dL    Comment: Performed at [facility], [place] [address], [place], [place] [address]  Phosphorus     Status: None   Collection Time: 01/27/2022  3:22 PM  Result Value Ref Range  Phosphorus 4.0 2.5 - 4.6 mg/dL    Comment: Performed at [facility], [place] [address], [place], [place] [address]  Glucose, capillary     Status: Abnormal   Collection Time: 01/25/2022  3:41 PM  Result Value Ref Range   Glucose-Capillary 190 (H) 70 - 99 mg/dL    Comment: Glucose reference range applies only to samples taken after fasting for at least 8 hours.  Glucose, capillary     Status: Abnormal   Collection Time: 02/03/2022  7:31 PM  Result Value Ref Range   Glucose-Capillary 203 (H) 70 - 99 mg/dL    Comment: Glucose reference range applies only to samples taken after fasting for at least 8 hours.  Glucose, capillary     Status: Abnormal   Collection Time: 01/23/2022 11:37 PM  Result Value Ref Range   Glucose-Capillary 211 (H) 70 - 99 mg/dL    Comment: Glucose reference range applies only to samples taken after fasting for at least 8 hours.  Glucose, capillary     Status: Abnormal   Collection Time: 01/31/22  3:31 AM  Result Value Ref Range   Glucose-Capillary 229 (H) 70 - 99 mg/dL    Comment: Glucose reference range applies only to samples taken after fasting for at least 8 hours.  Magnesium     Status: None   Collection Time: 01/31/22  5:50 AM  Result Value Ref Range   Magnesium 1.8 1.7 - 2.4 mg/dL    Comment: Performed at [facility] [address], [place], [place] [address]  Phosphorus     Status: None   Collection Time: 01/31/22  5:50 AM  Result Value Ref Range   Phosphorus 2.9 2.5 - 4.6 mg/dL    Comment: Performed at [facility] [address] [place], [place] [address]  CBC     Status: Abnormal   Collection Time: 01/31/22  5:50 AM  Result Value Ref Range   WBC 21.3 (H) 4.0 - 10.5 K/uL   RBC  2.95 (L) 3.87 - 5.11 MIL/uL   Hemoglobin 8.1 (L) 12.0 - 15.0 g/dL   HCT 23.6 (L) 36.0 - 46.0 %   MCV 80.0 80.0 - 100.0 fL   MCH 27.5 26.0 - 34.0 pg   MCHC 34.3 30.0 - 36.0 g/dL   RDW 16.5 (H) 11.5 - 15.5 %   Platelets 189 150 - 400 K/uL   nRBC 0.0 0.0 - 0.2 %    Comment: Performed at [facility], [place] [address] [place], [place] [address]  Basic metabolic panel     Status: Abnormal   Collection Time: 01/31/22  5:50 AM  Result Value Ref Range   Sodium 133 (L) 135 - 145 mmol/L   Potassium 3.5 3.5 - 5.1 mmol/L   Chloride 100 98 - 111 mmol/L   CO2 25 22 - 32 mmol/L   Glucose, Bld 327 (H) 70 - 99 mg/dL    Comment: Glucose reference range applies only to samples taken after fasting for at least 8 hours.   BUN 34 (H) 8 - 23 mg/dL   Creatinine, Ser 1.72 (H) 0.44 - 1.00 mg/dL   Calcium 8.1 (L) 8.9 - 10.3 mg/dL   GFR, Estimated 30 (L) >60 mL/min    Comment: (NOTE) Calculated using the CKD-EPI Creatinine Equation (2021)    Anion gap 8 5 - 15    Comment: Performed at [facility] [address] [place], [place] [address]  Glucose, capillary  Status: Abnormal   Collection Time: 01/31/22  7:49 AM  Result Value Ref Range   Glucose-Capillary 217 (H) 70 - 99 mg/dL    Comment: Glucose reference range applies only to samples taken after fasting for at least 8 hours.  Glucose, capillary     Status: Abnormal   Collection Time: 01/31/22 11:45 AM  Result Value Ref Range   Glucose-Capillary 160 (H) 70 - 99 mg/dL    Comment: Glucose reference range applies only to samples taken after fasting for at least 8 hours.  Glucose, capillary     Status: Abnormal   Collection Time: 01/31/22  3:41 PM  Result Value Ref Range   Glucose-Capillary 146 (H) 70 - 99 mg/dL    Comment: Glucose reference range applies only to samples taken after fasting for at least 8 hours.    MICRO:  IMAGING: [place] EKG SITE RITE  Result Date: 01/28/2022 If Site Rite image not attached, placement could not be  confirmed due to current cardiac rhythm.  ECHOCARDIOGRAM COMPLETE  Result Date: 01/17/2022    ECHOCARDIOGRAM REPORT   Patient Name:   Alyssa Ingram Date of Exam: 02/09/2022 Medical Rec #:  [id number]        Height:       60.0 in Accession #:    [id number]       Weight:       176.4 lb Date of Birth:  24-Oct-1945        BSA:          1.769 m Patient Age:    2 years         BP:           146/58 mmHg Patient Gender: F                HR:           73 bpm. Exam Location:  Inpatient Procedure: 2D Echo, Color Doppler and Cardiac Doppler Indications:    Cardiomyopathy  History:        Patient has prior history of Echocardiogram examinations, most                 recent 06/19/2021. CHF; Risk Factors:Hypertension, Diabetes and                 Dyslipidemia.  Sonographer:    Memory Argue Referring Phys: [id number] PEDRO EDUARDO CARDAMA IMPRESSIONS  1. Left ventricular ejection fraction, by estimation, is 65 to 70%. The left ventricle has normal function. The left ventricle has no regional wall motion abnormalities. There is severe left ventricular hypertrophy of the basal-septal segment. Left ventricular diastolic parameters are consistent with Grade III diastolic dysfunction (restrictive). Elevated left ventricular end-diastolic pressure.  2. Right ventricular systolic function is normal. The right ventricular size is normal.  3. Left atrial size was severely dilated.  4. Right atrial size was mildly dilated.  5. The mitral valve is degenerative. Moderate mitral valve regurgitation. Moderate mitral stenosis. The mean mitral valve gradient is 9.5 mmHg. Severe mitral annular calcification.  6. Tricuspid valve regurgitation is moderate to severe.  7. The aortic valve is calcified. There is moderate calcification of the aortic valve. There is moderate thickening of the aortic valve. Aortic valve regurgitation is not visualized. Aortic valve sclerosis/calcification is present, without any evidence of aortic stenosis. Aortic valve  area, by VTI measures 2.56 cm. Aortic valve mean gradient measures 8.0 mmHg. Aortic valve Vmax measures 1.98 m/s. FINDINGS  Left Ventricle: Left ventricular ejection fraction,  by estimation, is 65 to 70%. The left ventricle has normal function. The left ventricle has no regional wall motion abnormalities. The left ventricular internal cavity size was normal in size. There is  severe left ventricular hypertrophy of the basal-septal segment. Left ventricular diastolic parameters are consistent with Grade III diastolic dysfunction (restrictive). Elevated left ventricular end-diastolic pressure. Right Ventricle: The right ventricular size is normal. No increase in right ventricular wall thickness. Right ventricular systolic function is normal. Left Atrium: Left atrial size was severely dilated. Right Atrium: Right atrial size was mildly dilated. Pericardium: Trivial pericardial effusion is present. The pericardial effusion is circumferential. Mitral Valve: The mitral valve is degenerative in appearance. There is moderate thickening of the mitral valve leaflet(s). There is mild calcification of the mitral valve leaflet(s). Severe mitral annular calcification. Moderate mitral valve regurgitation, with eccentric anteriorly directed jet. Moderate mitral valve stenosis. MV peak gradient, 20.8 mmHg. The mean mitral valve gradient is 9.5 mmHg. Tricuspid Valve: The tricuspid valve is normal in structure. Tricuspid valve regurgitation is moderate to severe. No evidence of tricuspid stenosis. Aortic Valve: The aortic valve is calcified. There is moderate calcification of the aortic valve. There is moderate thickening of the aortic valve. Aortic valve regurgitation is not visualized. Aortic valve sclerosis/calcification is present, without any  evidence of aortic stenosis. Aortic valve mean gradient measures 8.0 mmHg. Aortic valve peak gradient measures 15.7 mmHg. Aortic valve area, by VTI measures 2.56 cm. Pulmonic Valve: The  pulmonic valve was normal in structure. Pulmonic valve regurgitation is mild. No evidence of pulmonic stenosis. Aorta: The aortic root is normal in size and structure. Venous: The inferior vena cava was not well visualized. IAS/Shunts: There is right bowing of the interatrial septum, suggestive of elevated left atrial pressure. No atrial level shunt detected by color flow Doppler.  LEFT VENTRICLE PLAX 2D LVIDd:         3.50 cm   Diastology LVIDs:         2.20 cm   LV e' medial:    3.70 cm/s LV PW:         1.30 cm   LV E/e' medial:  56.8 LV IVS:        2.40 cm   LV e' lateral:   6.09 cm/s LVOT diam:     2.00 cm   LV E/e' lateral: 34.5 LV SV:         93 LV SV Index:   53 LVOT Area:     3.14 cm  RIGHT VENTRICLE TAPSE (M-mode): 1.9 cm LEFT ATRIUM              Index        RIGHT ATRIUM           Index LA diam:        4.10 cm  2.32 cm/m   RA Area:     17.60 cm LA Vol (A2C):   139.0 ml 78.55 ml/m  RA Volume:   49.10 ml  27.75 ml/m LA Vol (A4C):   143.0 ml 80.82 ml/m LA Biplane Vol: 144.0 ml 81.38 ml/m  AORTIC VALVE AV Area (Vmax):    2.81 cm AV Area (Vmean):   2.64 cm AV Area (VTI):     2.56 cm AV Vmax:           198.00 cm/s AV Vmean:          131.000 cm/s AV VTI:            0.363 m  AV Peak Grad:      15.7 mmHg AV Mean Grad:      8.0 mmHg LVOT Vmax:         177.00 cm/s LVOT Vmean:        110.000 cm/s LVOT VTI:          0.296 m LVOT/AV VTI ratio: 0.82  AORTA Ao Root diam: 3.00 cm Ao Asc diam:  3.20 cm MITRAL VALVE                 TRICUSPID VALVE MV Area (PHT): 3.48 cm      TR Peak grad:   86.9 mmHg MV Area VTI:   1.33 cm      TR Vmax:        466.00 cm/s MV Peak grad:  20.8 mmHg MV Mean grad:  9.5 mmHg      SHUNTS MV Vmax:       2.28 m/s      Systemic VTI:  0.30 m MV Vmean:      144.0 cm/s    Systemic Diam: 2.00 cm MV Decel Time: 218 msec MR Peak grad:   100.8 mmHg MR Mean grad:   63.0 mmHg MR Vmax:        502.00 cm/s MR Vmean:       370.0 cm/s MR PISA:        1.57 cm MR PISA Radius: 0.50 cm MV E velocity:  210.00 cm/s MV A velocity: 105.00 cm/s MV E/A ratio:  2.00 Fransico Him MD Electronically signed by Fransico Him MD Signature Date/Time: 02/01/2022/10:44:08 AM    Final    CT HEAD WO CONTRAST  Result Date: 01/29/2022 CLINICAL DATA:  76 year old female status post CPR.  Intubated. EXAM: CT HEAD WITHOUT CONTRAST TECHNIQUE: Contiguous axial images were obtained from the base of the skull through the vertex without intravenous contrast. RADIATION DOSE REDUCTION: This exam was performed according to the departmental dose-optimization program which includes automated exposure control, adjustment of the mA and/or kV according to patient size and/or use of iterative reconstruction technique. COMPARISON:  None Available. FINDINGS: Brain: Chronic appearing encephalomalacia in the right hemisphere posterior right MCA and right MCA/PCA watershed areas. Some small associated cortical calcifications, and also probable conspicuous calcified posterior right MCA branch in the sylvian fissure series 3, image 15). Superimposed extensive heterogeneous hypodensity throughout the bilateral cerebral deep white matter and deep gray matter nuclei. This could be chronic. Similar small and chronic appearing lacunar infarcts in the brainstem. Evidence of midbrain Wallerian degeneration on the right. Small chronic appearing left superior cerebellar infarct series 3, image 10. No acute intracranial hemorrhage identified. No midline shift, mass effect, or evidence of intracranial mass lesion. Mild ex vacuo enlargement of the right lateral ventricle. No ventriculomegaly. No definite acute cerebral edema. Vascular: Calcified atherosclerosis at the skull base. Skull: No acute osseous abnormality identified. Sinuses/Orbits: Fluid in the ethmoid and sphenoid sinuses. Other visualized paranasal sinuses and mastoids are clear. Other: Intubated on the scout view. Fluid in the nasopharynx and nasal cavity. No acute orbit or scalp soft tissue finding.  IMPRESSION: 1. Severe chronic appearing ischemic disease of the brain with no prior study for comparison. No definite acute cerebral edema or acute intracranial abnormality. If the patient's mental status does not improve repeat noncontrast Head CT in 24-48 hours or alternatively noncontrast Brain MRI would be valuable. 2. Intubated.  Fluid in the pharynx and paranasal sinuses. Electronically Signed   By: Genevie Ann M.D.   On: 01/26/2022  06:26   DG Abd Portable 1V  Result Date: 02/09/2022 CLINICAL DATA:  76 year old female status post orogastric tube placement. EXAM: PORTABLE ABDOMEN - 1 VIEW COMPARISON:  No priors. FINDINGS: Tip of an enteric tube is noted in the proximal stomach with side port approximately 12 cm distal to the gastroesophageal junction. Visualized bowel gas pattern is nonobstructive. IMPRESSION: 1. Orogastric tube located in the proximal stomach, as above. Electronically Signed   By: Vinnie Langton M.D.   On: 01/29/2022 05:13   DG Chest Portable 1 View  Result Date: 02/06/2022 CLINICAL DATA:  Endotracheal tube placement. EXAM: PORTABLE CHEST 1 VIEW COMPARISON:  06/19/2021 FINDINGS: Heart is enlarged and the mediastinal contour is stable. Atherosclerotic calcification of the aorta is noted. The pulmonary vasculature is distended. Interstitial and airspace opacities are noted in the lungs bilaterally. No effusion or pneumothorax. The endotracheal tube terminates 2.5 cm above the carina. An enteric tube courses over the stomach and out of the field of view. IMPRESSION: 1. Cardiomegaly with pulmonary vascular congestion. 2. Interstitial and airspace opacities in the lungs bilaterally, possible edema or infiltrate. 3. Support apparatus as described above. Electronically Signed   By: Brett Fairy M.D.   On: 01/29/2022 04:50     Assessment/Plan:  76yo F with PEA code, found to have possible aspiration pneumonia and enterococcal bacteremia  - recommend to switch piptazo to amp/sub to treat  for pneumonia  Enterococcal bacteremia = will be covered by amp/sub. Will see if further cultures become positive. Repeat blood cx tomorrow to see that she is clearing bacteremia  Cardiogenic shock/pulm edema = continue with care as outlined by pulm critical care team. Appears to still need vent support/diuresis for the time being.

## 2022-01-31 NOTE — H&P (Addendum)
NAME:  Alyssa Ingram, MRN:  [id number], DOB:  1946-01-23, LOS: 1 ADMISSION DATE:  01/29/2022, CONSULTATION DATE:  02/05/2022 REFERRING MD:  Leonette Monarch, CHIEF COMPLAINT:  abdominal and neck pain, loss of consciousness  History of Present Illness:  76 yo woman with hx HFpEF, HOCM, asthma, GERD, HLD, HTN, Obesity, DMII, here after PEA cardiac arrest at home.    History is provided by son who lives with the patient.   She has been feeling poorly, including weakness and malaise for several days.  Chronic leg and foot pain was worse than usual.  This was followed by several days of abdominal discomfort, though no diarrhea or nausea or vomiting.  She was incontinent of stool once which is unusual for her.  9/17 she also developed neck pain.  She usually sleeps laying flat and was able to sleep Saturday night.  Since yesterday, they have seen her leaning over the sink and counter bracing herself.    No noticeable sob or cough.  Not complaining of dysuria.    She was feeling worse, becoming weaker and they had decided to take her to the ED, when she started to get worse and more altered.  911 called, and she had completely lost consciousness at that time. When ems arrived, breathing was agonal and she was pulseless. 22 min of cpr, rosc, king airway in the field.    In ED:  [facility] airway removed and intubated.  She was doing enough to fight the tube that she did receive doses of sedation in ed.  On levophed 65mg  LR 1 L ordered  Zosyn vanc ordered.  Sedation given to intubate, etomidate and rocuronium, as well as epi following intubation ('1mg'$ ) at around 430 am.   Echo from 06/19/21  1. IVS thickneess ~24 mm at the basal septum. LVOT gradients not fully  assessed. HOCM. Left ventricular ejection fraction, by estimation, is 70  to 75%. The left ventricle has hyperdynamic function. The left ventricle  has no regional wall motion  abnormalities. There is severe asymmetric left ventricular hypertrophy of  the  basal-septal segment. Left ventricular diastolic parameters are  indeterminate.   2. Right ventricular systolic function is normal. The right ventricular  size is normal. There is severely elevated pulmonary artery systolic  pressure.   Pertinent  Medical History   Past Medical History:  Diagnosis Date   Abnormal MRI, spine 11/2011   ?discitis L5-S1, s/p CT bx 12/12/11   Allergic rhinitis, cause unspecified 01/31/2013   Arthritis    Asthma    [facility]    Chronic diastolic heart failure (HCC)    Diabetic retinopathy (HCC)    GERD (gastroesophageal reflux disease)    H/O cardiovascular stress test    Nuclear study in 2011 normal perfusion   [facility] ([facility]rophic obstructive cardiomyopathy) ([facility]    Echo 12/19/11: Severe LVH, EF 55-65%, dynamic obstruction, wall motion normal, grade 1 diastolic dysfunction, systolic anterior motion of the mitral valve with mild MS and mild MR, mild LAE, PASP 34   Hyperlipidemia    Hypertension    Kidney stones    Obesity    Type II or unspecified type diabetes mellitus without mention of complication, uncontrolled 01/31/2013    Significant Hospital Events: Including procedures, antibiotic start and stop dates in addition to other pertinent events   9/18 admitted to ICU 9/19 waking up, following commands.  Weaning vasopressors.  Interim History / Subjective:  Now awake and following commands.  Still increased work of  breathing on SBT.  Complains of generalized pain following CPR  Objective   Blood pressure 102/69, pulse 69, temperature 99 F (37.2 C), resp. rate (!) 32, height 5' (1.524 m), weight 80 kg, SpO2 97 %.    Vent Mode: PRVC FiO2 (%):  [40 %-60 %] 40 % Set Rate:  [22 bmp] 22 bmp Vt Set:  [360 mL-400 mL] 360 mL PEEP:  [8 cmH20] 8 cmH20 Plateau Pressure:  [19 cmH20-24 cmH20] 22 cmH20   Intake/Output Summary (Last 24 hours) at 01/31/2022 1308 Last data filed at 01/31/2022 1200 Gross per 24 hour  Intake 2705.98 ml  Output 660  ml  Net 2045.98 ml   Filed Weights   01/20/2022 0449 01/31/22 0500  Weight: 80 kg 80 kg    Examination: General: Off sedation, awake following commands  HENT: OG tube, ET tube in place Lungs: Clear bilaterally Cardiovascular: Distant but normal.  Extremities warm Abdomen: Nd, NBS  Extremities: B edema and thickened skin  Neuro: Awake and following commands with no focal deficits GU: foley  Ancillary tests personally reviewed:  Mild increase in creatinine Glucose poorly controlled Elevated BNP and troponin. Assessment & Plan:   Critically ill due to PEA cardiac arrest secondary to acute decompensated HFpEF requiring mechanical ventilation for acute hypoxic respiratory failure from cardiogenic pulmonary edema and titration of norepinephrine for cardiogenic and distributive shock -Continue to wean norepinephrine to keep MAP greater than 65 -Daily SBT screen, but will likely require diuresis prior to successful extubation. -Neurological status has improved and patient likely to make full neurological recovery. -Multimodal pain control for MSK pain following CPR.  Possible aspiration pneumonia -Complete 7 days Unasyn  Possible enterococcal bacteremia but more likely contaminant -Should be adequately covered by Unasyn.  Hypertensive cardiomyopathy with demand related ischemia. -Diurese today.  Mild AKI -Continue to monitor, may improve with renal decongestion.  Best Practice (right click and "Reselect all SmartList Selections" daily)   Diet/type: NPO start tube feeds DVT prophylaxis: prophylactic heparin  GI prophylaxis: PPI Lines: N/A Foley:  N/A Code Status:  full code Last date of multidisciplinary goals of care discussion '[]'$  Discussed plan with son Juanda Crumble at bedside.  Other children on the phone.    CRITICAL CARE Performed by: Kipp Brood   Total critical care time: 40 minutes  Critical care time was exclusive of separately billable procedures and treating  other patients.  Critical care was necessary to treat or prevent imminent or life-threatening deterioration.  Critical care was time spent personally by me on the following activities: development of treatment plan with patient and/or surrogate as well as nursing, discussions with consultants, evaluation of patient's response to treatment, examination of patient, obtaining history from patient or surrogate, ordering and performing treatments and interventions, ordering and review of laboratory studies, ordering and review of radiographic studies, pulse oximetry, re-evaluation of patient's condition and participation in multidisciplinary rounds.  Kipp Brood, MD [facility] ICU Physician [place]  Pager: [phone] Mobile: [phone] After hours: [phone].

## 2022-02-01 ENCOUNTER — Inpatient Hospital Stay (HOSPITAL_COMMUNITY): Payer: Medicare Other

## 2022-02-01 DIAGNOSIS — I469 Cardiac arrest, cause unspecified: Secondary | ICD-10-CM | POA: Diagnosis not present

## 2022-02-01 LAB — CBC WITH DIFFERENTIAL/PLATELET
Abs Immature Granulocytes: 0.22 10*3/uL — ABNORMAL HIGH (ref 0.00–0.07)
Basophils Absolute: 0 10*3/uL (ref 0.0–0.1)
Basophils Relative: 0 %
Eosinophils Absolute: 0.3 10*3/uL (ref 0.0–0.5)
Eosinophils Relative: 1 %
HCT: 23.6 % — ABNORMAL LOW (ref 36.0–46.0)
Hemoglobin: 7.8 g/dL — ABNORMAL LOW (ref 12.0–15.0)
Immature Granulocytes: 1 %
Lymphocytes Relative: 7 %
Lymphs Abs: 1.7 10*3/uL (ref 0.7–4.0)
MCH: 27 pg (ref 26.0–34.0)
MCHC: 33.1 g/dL (ref 30.0–36.0)
MCV: 81.7 fL (ref 80.0–100.0)
Monocytes Absolute: 1 10*3/uL (ref 0.1–1.0)
Monocytes Relative: 4 %
Neutro Abs: 21.1 10*3/uL — ABNORMAL HIGH (ref 1.7–7.7)
Neutrophils Relative %: 87 %
Platelets: 212 10*3/uL (ref 150–400)
RBC: 2.89 MIL/uL — ABNORMAL LOW (ref 3.87–5.11)
RDW: 16.5 % — ABNORMAL HIGH (ref 11.5–15.5)
WBC: 24.4 10*3/uL — ABNORMAL HIGH (ref 4.0–10.5)
nRBC: 0 % (ref 0.0–0.2)

## 2022-02-01 LAB — PHOSPHORUS: Phosphorus: 3.2 mg/dL (ref 2.5–4.6)

## 2022-02-01 LAB — GLUCOSE, CAPILLARY
Glucose-Capillary: 163 mg/dL — ABNORMAL HIGH (ref 70–99)
Glucose-Capillary: 173 mg/dL — ABNORMAL HIGH (ref 70–99)
Glucose-Capillary: 40 mg/dL — CL (ref 70–99)
Glucose-Capillary: 130 mg/dL — ABNORMAL HIGH (ref 70–99)
Glucose-Capillary: 73 mg/dL (ref 70–99)
Glucose-Capillary: 126 mg/dL — ABNORMAL HIGH (ref 70–99)
Glucose-Capillary: 165 mg/dL — ABNORMAL HIGH (ref 70–99)
Glucose-Capillary: 131 mg/dL — ABNORMAL HIGH (ref 70–99)

## 2022-02-01 LAB — COMPREHENSIVE METABOLIC PANEL
ALT: 58 U/L — ABNORMAL HIGH (ref 0–44)
AST: 31 U/L (ref 15–41)
Albumin: 2 g/dL — ABNORMAL LOW (ref 3.5–5.0)
Alkaline Phosphatase: 80 U/L (ref 38–126)
Anion gap: 9 (ref 5–15)
BUN: 37 mg/dL — ABNORMAL HIGH (ref 8–23)
CO2: 25 mmol/L (ref 22–32)
Calcium: 8.6 mg/dL — ABNORMAL LOW (ref 8.9–10.3)
Chloride: 107 mmol/L (ref 98–111)
Creatinine, Ser: 1.78 mg/dL — ABNORMAL HIGH (ref 0.44–1.00)
GFR, Estimated: 29 mL/min — ABNORMAL LOW (ref >60)
Glucose, Bld: 159 mg/dL — ABNORMAL HIGH (ref 70–99)
Potassium: 3.7 mmol/L (ref 3.5–5.1)
Sodium: 141 mmol/L (ref 135–145)
Total Bilirubin: 0.6 mg/dL (ref 0.3–1.2)
Total Protein: 6.6 g/dL (ref 6.5–8.1)

## 2022-02-01 LAB — LEGIONELLA PNEUMOPHILA SEROGP 1 UR AG: L. pneumophila Serogp 1 Ur Ag: NEGATIVE

## 2022-02-01 LAB — MAGNESIUM: Magnesium: 1.9 mg/dL (ref 1.7–2.4)

## 2022-02-01 MED ORDER — POTASSIUM CHLORIDE 20 MEQ PO PACK
40.0000 meq | PACK | Freq: Once | ORAL | Status: AC
  Administered 2022-02-01: 40 meq
  Filled 2022-02-01: qty 2

## 2022-02-01 MED ORDER — FENTANYL 2500MCG IN NS 250ML (10MCG/ML) PREMIX INFUSION
25.0000 ug/h | INTRAVENOUS | Status: DC
  Administered 2022-02-01: 25 ug/h via INTRAVENOUS
  Filled 2022-02-01: qty 250

## 2022-02-01 MED ORDER — MAGNESIUM SULFATE 2 GM/50ML IV SOLN
2.0000 g | Freq: Once | INTRAVENOUS | Status: AC
  Administered 2022-02-01: 2 g via INTRAVENOUS
  Filled 2022-02-01: qty 50

## 2022-02-01 MED ORDER — DEXMEDETOMIDINE HCL IN NACL 400 MCG/100ML IV SOLN
0.4000 ug/kg/h | INTRAVENOUS | Status: DC
  Administered 2022-02-01: 0.4 ug/kg/h via INTRAVENOUS
  Administered 2022-02-01: 0.6 ug/kg/h via INTRAVENOUS
  Administered 2022-02-02: 0.8 ug/kg/h via INTRAVENOUS
  Filled 2022-02-01 (×3): qty 100

## 2022-02-01 MED ORDER — DEXTROSE 50 % IV SOLN
INTRAVENOUS | Status: AC
  Filled 2022-02-01: qty 50

## 2022-02-01 MED ORDER — ALBUTEROL SULFATE (2.5 MG/3ML) 0.083% IN NEBU
2.5000 mg | INHALATION_SOLUTION | RESPIRATORY_TRACT | Status: DC | PRN
  Administered 2022-02-04: 2.5 mg via RESPIRATORY_TRACT
  Filled 2022-02-01: qty 3

## 2022-02-01 MED ORDER — DEXTROSE 50 % IV SOLN
1.0000 | Freq: Once | INTRAVENOUS | Status: AC
  Administered 2022-02-01: 50 mL via INTRAVENOUS

## 2022-02-01 NOTE — Progress Notes (Signed)
[facility] for Infectious Disease    Date of Admission:  01/29/2022   Total days of antibiotics 4           ID: Alyssa Ingram is a 76 y.o. female with  entercoccal bacteremia of unclear source Principal Problem:   Cardiac arrest ([place])    Subjective: Afebrile, but still remains intubated, underwent CT due to localized abdominal pain  Imaging does not suggest source of intra-abdominal process such as abscess or diverticulitis  Medications:   acetaminophen  650 mg Per Tube Q6H   Chlorhexidine Gluconate Cloth  6 each Topical Daily   furosemide  80 mg Intravenous BID   heparin  5,000 Units Subcutaneous Q8H   insulin aspart  0-15 Units Subcutaneous Q4H   insulin aspart  5 Units Subcutaneous Q4H   ipratropium-albuterol  3 mL Nebulization Q6H   lidocaine  2 patch Transdermal Q24H   multivitamin  15 mL Per Tube Daily   mouth rinse  15 mL Mouth Rinse Q2H   pantoprazole  40 mg Per Tube Daily   sodium chloride flush  10-40 mL Intracatheter Q12H    Objective: Vital signs in last 24 hours: Temp:  [98.8 F (37.1 C)-100 F (37.8 C)] 99 F (37.2 C) (09/20 1430) Pulse Rate:  [66-85] 72 (09/20 1430) Resp:  [16-40] 22 (09/20 1430) BP: (70-142)/(42-111) 93/60 (09/20 1430) SpO2:  [84 %-100 %] 99 % (09/20 1541) FiO2 (%):  [40 %] 40 % (09/20 1541) Weight:  [81.6 kg] 81.6 kg (09/20 0545)  Physical Exam  Constitutional:  sedated/intubated appears well-developed and well-nourished. No distress.  HENT: [place]/AT, PERRLA, no scleral icterus.oett in place Cardiovascular: Normal rate, regular rhythm and normal heart sounds. Exam reveals no gallop and no friction rub.  No murmur heard.  Pulmonary/Chest: Effort normal and breath sounds normal. No respiratory distress.  has no wheezes.  Abdominal: Soft. Bowel sounds are normal.  exhibits no distension. There is no tenderness.  Skin: Skin is warm and dry. No rash noted. No erythema.    Lab Results Recent Labs    01/31/22 0550  02/01/22 0326  WBC 21.3* 24.4*  HGB 8.1* 7.8*  HCT 23.6* 23.6*  NA 133* 141  K 3.5 3.7  CL 100 107  CO2 25 25  BUN 34* 37*  CREATININE 1.72* 1.78*   Liver Panel Recent Labs    02/03/2022 0425 02/01/22 0326  PROT 7.1 6.6  ALBUMIN 2.4* 2.0*  AST 210* 31  ALT 112* 58*  ALKPHOS 88 80  BILITOT 1.6* 0.6   Sedimentation Rate No results for input(s): "ESRSEDRATE" in the last 72 hours. C-Reactive Protein No results for input(s): "CRP" in the last 72 hours.  Microbiology: 9/17 blood cx enterococcus faecalis 2/2 sets 9/20 blood cx pending Studies/Results: CT ABDOMEN PELVIS WO CONTRAST  Result Date: 02/01/2022 CLINICAL DATA:  Sepsis.  Enterococcal bacteremia. EXAM: CT ABDOMEN AND PELVIS WITHOUT CONTRAST TECHNIQUE: Multidetector CT imaging of the abdomen and pelvis was performed following the standard protocol without IV contrast. RADIATION DOSE REDUCTION: This exam was performed according to the departmental dose-optimization program which includes automated exposure control, adjustment of the mA and/or kV according to patient size and/or use of iterative reconstruction technique. COMPARISON:  None Available. FINDINGS: Lower chest: Moderate-sized bilateral pleural effusions with overlying atelectasis or infiltrate. Aspiration would be a consideration. The heart is normal in size. No pericardial effusion. There is an NG tube coursing down the esophagus and into the stomach. Hepatobiliary: No hepatic lesions or intrahepatic  biliary dilatation. The gallbladder contains gallstones and sludge. No common bile duct dilatation. Pancreas: No mass, inflammation or ductal dilatation. Spleen: Normal size.  No focal lesions. Adrenals/Urinary Tract: The adrenal glands are unremarkable. Numerous left renal calculi but no renal lesions or hydronephrosis. Bladder contains a Foley catheter. Stomach/Bowel: The stomach, duodenum, small bowel and colon grossly normal. No acute inflammatory process or obstructive  findings. Moderate sigmoid colon diverticulosis but no findings for acute diverticulitis. Vascular/Lymphatic: Scattered atherosclerotic calcifications involving the aorta and iliac arteries. No mesenteric or retroperitoneal mass or adenopathy. Reproductive: The uterus is surgically absent. Both ovaries are still present and are grossly normal. Other: Significant pelvic floor relaxation with suspected rectocele, vaginocele and cystocele. Mild diffuse body wall edema. Musculoskeletal: No significant bony findings. 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 IMPRESSION: 1. Moderate-sized bilateral pleural effusions with overlying atelectasis or infiltrate. Aspiration would be a consideration. 2. Cholelithiasis and gallbladder sludge. 3. Numerous left renal calculi. 4. No findings for intra-abdominal/intrapelvic abscess. 5. Significant pelvic floor relaxation with suspected rectocele, vaginocele and cystocele. Aortic Atherosclerosis (ICD10-I70.0). Electronically Signed   By: Marijo Sanes M.D.   On: 02/01/2022 11:18   DG CHEST PORT 1 VIEW  Result Date: 02/01/2022 CLINICAL DATA:  Endotracheal tube.  Cardiac arrest. EXAM: PORTABLE CHEST 1 VIEW COMPARISON:  Chest radiograph 01/16/2022 FINDINGS: Endotracheal tube terminates approximately 2-3 cm above the carina, unchanged from prior. Enteric tube courses below diaphragm with the side hole and tip projecting over the expected location of the stomach. There is a presumed right arm PICC which likely terminates in the left brachiocephalic vein. Cardiomegaly. No pneumothorax. No pleural effusion. Slight interval increase versus new hazy right basilar pulmonary opacity could represent aspiration or infection. There are mildly displaced fractures of right lateral ribs 3 through 5. IMPRESSION: 1. Endotracheal tube terminates approximately 2-3 cm above the carina, unchanged from prior. 2. If there is a right arm PICC in place, terminates in the left  brachiocephalic vein. 3. There is a new hazy right basilar pulmonary opacity, which could represent atelectasis, but superimposed infection or aspiration is difficult to exclude. 4. There are mildly displaced fractures of right lateral third through fifth ribs. No radiographic evidence of a large pneumothorax. Electronically Signed   By: Marin Roberts M.D.   On: 02/01/2022 08:24     Assessment/Plan: Enterococcal bacteremia = continue on amp/sub. Will repeat blood cx this morning to see that she is clearing bacteremia. Will discuss with pulm critical care team utility for TEE to help with length of therapy and exclude endocarditis  Possible aspiration pneumonia = continue on 7d course of amp/sub.  [facility] for Infectious Diseases Pager: [phone]  02/01/2022, 3:43 PM

## 2022-02-01 NOTE — Progress Notes (Signed)
eLink Physician-Brief Progress Note Patient Name: ZSAZSA BAHENA DOB: 1946-04-19 MRN: [id number]   Date of Service  02/01/2022  HPI/Events of Note  Patient is having frequent loose stools, no contraindication to [facility].  eICU Interventions  Flexiseal ordered.        Kerry Kass Lenix Benoist 02/01/2022, 9:06 PM

## 2022-02-01 NOTE — Progress Notes (Signed)
NAME:  Alyssa Ingram, MRN:  [id number], DOB:  [birth year], LOS: 2 ADMISSION DATE:  02/07/2022, CONSULTATION DATE:  01/13/2022 REFERRING MD:  Leonette Monarch, CHIEF COMPLAINT:  abdominal and neck pain, loss of consciousness  History of Present Illness:  76 yo woman with hx HFpEF, HOCM, asthma, GERD, HLD, HTN, Obesity, DMII, here after PEA cardiac arrest at home.    History is provided by son who lives with the patient.   She has been feeling poorly, including weakness and malaise for several days.  Chronic leg and foot pain was worse than usual.  This was followed by several days of abdominal discomfort, though no diarrhea or nausea or vomiting.  She was incontinent of stool once which is unusual for her.  9/17 she also developed neck pain.  She usually sleeps laying flat and was able to sleep Saturday night.  Since yesterday, they have seen her leaning over the sink and counter bracing herself.    No noticeable sob or cough.  Not complaining of dysuria.    She was feeling worse, becoming weaker and they had decided to take her to the ED, when she started to get worse and more altered.  911 called, and she had completely lost consciousness at that time. When ems arrived, breathing was agonal and she was pulseless. 22 min of cpr, rosc, king airway in the field.    In ED:  [facility] airway removed and intubated.  She was doing enough to fight the tube that she did receive doses of sedation in ed.  On levophed 72mg  LR 1 L ordered  Zosyn vanc ordered.  Sedation given to intubate, etomidate and rocuronium, as well as epi following intubation ('1mg'$ ) at around 430 am.   Echo from 06/19/21  1. IVS thickneess ~24 mm at the basal septum. LVOT gradients not fully  assessed. HOCM. Left ventricular ejection fraction, by estimation, is 70  to 75%. The left ventricle has hyperdynamic function. The left ventricle  has no regional wall motion  abnormalities. There is severe asymmetric left ventricular hypertrophy of  the  basal-septal segment. Left ventricular diastolic parameters are  indeterminate.   2. Right ventricular systolic function is normal. The right ventricular  size is normal. There is severely elevated pulmonary artery systolic  pressure.   Pertinent  Medical History   Past Medical History:  Diagnosis Date   Abnormal MRI, spine 11/2011   ?discitis L5-S1, s/p CT bx 12/12/11   Allergic rhinitis, cause unspecified 01/31/2013   Arthritis    Asthma    Cervical cancer ([place]    Chronic diastolic heart failure (HCC)    Diabetic retinopathy (HCC)    GERD (gastroesophageal reflux disease)    H/O cardiovascular stress test    Nuclear study in 2011 normal perfusion   HOCM (hypertrophic obstructive cardiomyopathy) ([place]    Echo 12/19/11: Severe LVH, EF 55-65%, dynamic obstruction, wall motion normal, grade 1 diastolic dysfunction, systolic anterior motion of the mitral valve with mild MS and mild MR, mild LAE, PASP 34   Hyperlipidemia    Hypertension    Kidney stones    Obesity    Type II or unspecified type diabetes mellitus without mention of complication, uncontrolled 01/31/2013    Significant Hospital Events: Including procedures, antibiotic start and stop dates in addition to other pertinent events   9/18 admitted to ICU 9/19 waking up, following commands.  Weaning vasopressors.  Interim History / Subjective:  Now awake and following commands.  Still increased work of  breathing on SBT.  Complains of generalized pain following CPR  Objective   Blood pressure 105/67, pulse 67, temperature 99.5 F (37.5 C), resp. rate (!) 21, height 5' (1.524 m), weight 81.6 kg, SpO2 96 %.    Vent Mode: PRVC FiO2 (%):  [40 %] 40 % Set Rate:  [22 bmp] 22 bmp Vt Set:  [360 mL] 360 mL PEEP:  [8 cmH20] 8 cmH20 Plateau Pressure:  [18 cmH20-22 cmH20] 20 cmH20   Intake/Output Summary (Last 24 hours) at 02/01/2022 1213 Last data filed at 02/01/2022 1130 Gross per 24 hour  Intake 2901.33 ml  Output 1415 ml  Net  1486.33 ml    Filed Weights   02/07/2022 0449 01/31/22 0500 02/01/22 0545  Weight: 80 kg 80 kg 81.6 kg    Examination: General: Off sedation, awake following commands  HENT: OG tube, ET tube in place Lungs: Wheezing with stimulation on SBT/ Cardiovascular: Distant but normal.  Extremities warm Abdomen: Nd, NBS  Extremities: B edema and thickened skin  Neuro: Awake and following commands with no focal deficits GU: foley  Ancillary tests personally reviewed:  Mild increase in creatinine Glucose poorly controlled Elevated BNP and troponin. CT  Assessment & Plan:   Critically ill due to PEA cardiac arrest secondary to acute decompensated HFpEF requiring mechanical ventilation for acute hypoxic respiratory failure from cardiogenic pulmonary edema and titration of norepinephrine for cardiogenic and distributive shock -Continue to wean norepinephrine to keep MAP greater than 65 -Daily SBT screen, but will likely require diuresis prior to successful extubation. -Neurological status has improved and patient likely to make full neurological recovery. -Multimodal pain control for MSK pain following CPR.  Possible aspiration pneumonia -Complete 7 days Unasyn  Possible enterococcal bacteremia with marked leukocytosis -Should be adequately covered by Unasyn. - CT abdomen and pelvis looking for source.   Hypertensive cardiomyopathy with demand related ischemia. -Diurese today.  Mild AKI -Follow creatinine.  Best Practice (right click and "Reselect all SmartList Selections" daily)   Diet/type: NPO start tube feeds DVT prophylaxis: prophylactic heparin  GI prophylaxis: PPI Lines: N/A Foley:  N/A Code Status:  full code Last date of multidisciplinary goals of care discussion '[]'$  Discussed plan with son Juanda Crumble at bedside.  Other children on the phone.    CRITICAL CARE Performed by: Kipp Brood   Total critical care time: 40 minutes  Critical care time was exclusive of  separately billable procedures and treating other patients.  Critical care was necessary to treat or prevent imminent or life-threatening deterioration.  Critical care was time spent personally by me on the following activities: development of treatment plan with patient and/or surrogate as well as nursing, discussions with consultants, evaluation of patient's response to treatment, examination of patient, obtaining history from patient or surrogate, ordering and performing treatments and interventions, ordering and review of laboratory studies, ordering and review of radiographic studies, pulse oximetry, re-evaluation of patient's condition and participation in multidisciplinary rounds.  Kipp Brood, MD [facility] ICU Physician [place]  Pager: [phone] Mobile: [phone] After hours: [phone].

## 2022-02-01 NOTE — Progress Notes (Signed)
Hypoglycemic Event  CBG: 40  Treatment: D50 50 mL (25 gm)  Symptoms: None  Follow-up CBG: Time: 1855  CBG Result:130  Possible Reasons for Event: Unknown  Comments/MD notified: Lynetta Mare, MD    Lenore Cordia

## 2022-02-01 NOTE — Progress Notes (Signed)
[facility] ADULT ICU REPLACEMENT PROTOCOL   The patient does apply for the [facility] Adult ICU Electrolyte Replacment Protocol based on the criteria listed below:   1.Exclusion criteria: TCTS patients, ECMO patients, and Dialysis patients 2. Is GFR >/= 30 ml/min? No  Patient's GFR today is 29 3. Is SCr </= 2? Yes.   Patient's SCr is 1.78 mg/dL 4. Did SCr increase >/= 0.5 in 24 hours? No. 5.Pt's weight >40kg  Yes.   6. Abnormal electrolyte(s): K+ 3.7, mag 1.9  7. Electrolytes replaced per protocol 8.  Call MD STAT for K+ </= 2.5, Phos </= 1, or Mag </= 1 Physician:  n/a  Darlys Gales 02/01/2022 4:46 AM

## 2022-02-01 NOTE — Progress Notes (Signed)
Patient was transported to CT & back on the ventilator with no problems.

## 2022-02-02 ENCOUNTER — Inpatient Hospital Stay (HOSPITAL_COMMUNITY): Payer: Medicare Other

## 2022-02-02 DIAGNOSIS — I469 Cardiac arrest, cause unspecified: Secondary | ICD-10-CM | POA: Diagnosis not present

## 2022-02-02 LAB — COMPREHENSIVE METABOLIC PANEL
ALT: 41 U/L (ref 0–44)
AST: 24 U/L (ref 15–41)
Albumin: 1.9 g/dL — ABNORMAL LOW (ref 3.5–5.0)
Alkaline Phosphatase: 75 U/L (ref 38–126)
Anion gap: 9 (ref 5–15)
BUN: 42 mg/dL — ABNORMAL HIGH (ref 8–23)
CO2: 24 mmol/L (ref 22–32)
Calcium: 8.3 mg/dL — ABNORMAL LOW (ref 8.9–10.3)
Chloride: 103 mmol/L (ref 98–111)
Creatinine, Ser: 1.63 mg/dL — ABNORMAL HIGH (ref 0.44–1.00)
GFR, Estimated: 32 mL/min — ABNORMAL LOW (ref >60)
Glucose, Bld: 182 mg/dL — ABNORMAL HIGH (ref 70–99)
Potassium: 4.2 mmol/L (ref 3.5–5.1)
Sodium: 136 mmol/L (ref 135–145)
Total Bilirubin: 0.8 mg/dL (ref 0.3–1.2)
Total Protein: 6.6 g/dL (ref 6.5–8.1)

## 2022-02-02 LAB — MAGNESIUM: Magnesium: 2.4 mg/dL (ref 1.7–2.4)

## 2022-02-02 LAB — POCT I-STAT 7, (LYTES, BLD GAS, ICA,H+H)
Acid-base deficit: 5 mmol/L — ABNORMAL HIGH (ref 0.0–2.0)
Bicarbonate: 22.8 mmol/L (ref 20.0–28.0)
Calcium, Ion: 1.23 mmol/L (ref 1.15–1.40)
HCT: 27 % — ABNORMAL LOW (ref 36.0–46.0)
Hemoglobin: 9.2 g/dL — ABNORMAL LOW (ref 12.0–15.0)
O2 Saturation: 99 %
Potassium: 4.5 mmol/L (ref 3.5–5.1)
Sodium: 140 mmol/L (ref 135–145)
TCO2: 24 mmol/L (ref 22–32)
pCO2 arterial: 53 mmHg — ABNORMAL HIGH (ref 32–48)
pH, Arterial: 7.242 — ABNORMAL LOW (ref 7.35–7.45)
pO2, Arterial: 137 mmHg — ABNORMAL HIGH (ref 83–108)

## 2022-02-02 LAB — CBC WITH DIFFERENTIAL/PLATELET
Abs Immature Granulocytes: 0.19 10*3/uL — ABNORMAL HIGH (ref 0.00–0.07)
Basophils Absolute: 0 10*3/uL (ref 0.0–0.1)
Basophils Relative: 0 %
Eosinophils Absolute: 0.2 10*3/uL (ref 0.0–0.5)
Eosinophils Relative: 1 %
HCT: 21.8 % — ABNORMAL LOW (ref 36.0–46.0)
Hemoglobin: 7.3 g/dL — ABNORMAL LOW (ref 12.0–15.0)
Immature Granulocytes: 1 %
Lymphocytes Relative: 8 %
Lymphs Abs: 1.5 10*3/uL (ref 0.7–4.0)
MCH: 27.1 pg (ref 26.0–34.0)
MCHC: 33.5 g/dL (ref 30.0–36.0)
MCV: 81 fL (ref 80.0–100.0)
Monocytes Absolute: 1 10*3/uL (ref 0.1–1.0)
Monocytes Relative: 5 %
Neutro Abs: 17.1 10*3/uL — ABNORMAL HIGH (ref 1.7–7.7)
Neutrophils Relative %: 85 %
Platelets: 199 10*3/uL (ref 150–400)
RBC: 2.69 MIL/uL — ABNORMAL LOW (ref 3.87–5.11)
RDW: 16.9 % — ABNORMAL HIGH (ref 11.5–15.5)
WBC: 20.1 10*3/uL — ABNORMAL HIGH (ref 4.0–10.5)
nRBC: 0 % (ref 0.0–0.2)

## 2022-02-02 LAB — CULTURE, BLOOD (ROUTINE X 2)
Special Requests: ADEQUATE
Special Requests: ADEQUATE

## 2022-02-02 LAB — GLUCOSE, CAPILLARY
Glucose-Capillary: 179 mg/dL — ABNORMAL HIGH (ref 70–99)
Glucose-Capillary: 171 mg/dL — ABNORMAL HIGH (ref 70–99)
Glucose-Capillary: 162 mg/dL — ABNORMAL HIGH (ref 70–99)
Glucose-Capillary: 221 mg/dL — ABNORMAL HIGH (ref 70–99)
Glucose-Capillary: 229 mg/dL — ABNORMAL HIGH (ref 70–99)

## 2022-02-02 MED ORDER — DEXAMETHASONE SODIUM PHOSPHATE 4 MG/ML IJ SOLN
INTRAMUSCULAR | Status: AC
  Filled 2022-02-02: qty 1

## 2022-02-02 MED ORDER — ETOMIDATE 2 MG/ML IV SOLN: 10.0000 mg | INTRAVENOUS | Status: AC

## 2022-02-02 MED ORDER — DEXAMETHASONE SODIUM PHOSPHATE 10 MG/ML IJ SOLN
10.0000 mg | Freq: Once | INTRAMUSCULAR | Status: AC
  Administered 2022-02-02: 10 mg via INTRAVENOUS
  Filled 2022-02-02: qty 1

## 2022-02-02 MED ORDER — RACEPINEPHRINE HCL 2.25 % IN NEBU
INHALATION_SOLUTION | RESPIRATORY_TRACT | Status: AC
  Administered 2022-02-02: 0.5 mL
  Filled 2022-02-02: qty 0.5

## 2022-02-02 MED ORDER — ETOMIDATE 2 MG/ML IV SOLN
INTRAVENOUS | Status: AC
  Administered 2022-02-02: 10 mg via INTRAVENOUS
  Filled 2022-02-02: qty 20

## 2022-02-02 MED ORDER — FENTANYL CITRATE PF 50 MCG/ML IJ SOSY
PREFILLED_SYRINGE | INTRAMUSCULAR | Status: AC
  Administered 2022-02-02: 50 ug via INTRAVENOUS
  Filled 2022-02-02: qty 2

## 2022-02-02 MED ORDER — ROCURONIUM BROMIDE 10 MG/ML (PF) SYRINGE
PREFILLED_SYRINGE | INTRAVENOUS | Status: AC
  Administered 2022-02-02: 50 mg via INTRAVENOUS
  Filled 2022-02-02: qty 10

## 2022-02-02 MED ORDER — ROCURONIUM BROMIDE 10 MG/ML (PF) SYRINGE: 50.0000 mg | PREFILLED_SYRINGE | INTRAVENOUS | Status: AC

## 2022-02-02 MED ORDER — POLYVINYL ALCOHOL 1.4 % OP SOLN
1.0000 [drp] | OPHTHALMIC | Status: DC | PRN
  Administered 2022-02-02 – 2022-02-16 (×6): 1 [drp] via OPHTHALMIC
  Filled 2022-02-02: qty 15

## 2022-02-02 MED ORDER — FENTANYL CITRATE PF 50 MCG/ML IJ SOSY: 50.0000 ug | PREFILLED_SYRINGE | INTRAMUSCULAR | Status: AC

## 2022-02-02 NOTE — Procedures (Signed)
Cortrak  Person Inserting Tube:  Ranell Patrick D, RD Tube Type:  Cortrak - 43 inches Tube Size:  10 Tube Location:  Left nare Secured by: Bridle Technique Used to Measure Tube Placement:  Marking at nare/corner of mouth Cortrak Secured At:  72 cm  Cortrak Tube Team Note:  Consult received to place a Cortrak feeding tube.   X-ray is required, abdominal x-ray has been ordered by the Cortrak team. Please confirm tube placement before using the Cortrak tube.   If the tube becomes dislodged please keep the tube and contact the Cortrak team at [email or web address] (password [email or web address]) for replacement.  If after hours and replacement cannot be delayed, place a NG tube and confirm placement with an abdominal x-ray.   Ranell Patrick, RD, LDN Clinical Dietitian RD pager # available in [place]  After hours/weekend pager # available in 

## 2022-02-02 NOTE — Progress Notes (Signed)
Called to evaluate patient for decreased responsiveness globally, unequal pupil exam. Otherwise non focal. She is moving all 4 extremities and withdrawing to noxious stimuli. No sedation given since intubation earlier in the day (etomidate, rocuronium.) reasonable to proceed with CT head non contrast stat to evaluate further.   Lenice Llamas, MD Pulmonary and [place] 02/02/2022 10:46 PM Pager: see AMION  If no response to pager, please call critical care on call (see AMION) until 7pm After 7:00 pm call [place]

## 2022-02-02 NOTE — Progress Notes (Signed)
eLink Physician-Brief Progress Note Patient Name: Alyssa Ingram DOB: April 14, 1946 MRN: [id number]   Date of Service  02/02/2022  HPI/Events of Note  Patient with altered mental status and pipillary changes  of unclear etiology, she was re-intubated earlier today but received minimal sedation, and 50 mg of Rocuronium, renal function is normal. Neuro exam via camera is equivocal.  eICU Interventions  PCCM Ground crew requested to evaluate patient at bedside.        Kerry Kass Kimberlee Shoun 02/02/2022, 10:35 PM

## 2022-02-02 NOTE — Procedures (Signed)
Intubation Procedure Note  Alyssa Ingram  [id number]  1946-04-25  Date:02/02/22  Time:12:00 PM   Provider Performing:Bessye Stith    Procedure: Intubation (03546)  Indication(s) Respiratory Failure  Consent Unable to obtain consent due to emergent nature of procedure.   Anesthesia Etomidate, Fentanyl, and Rocuronium   Time Out Verified patient identification, verified procedure, site/side was marked, verified correct patient position, special equipment/implants available, medications/allergies/relevant history reviewed, required imaging and test results available.   Sterile Technique Usual hand hygeine, masks, and gloves were used   Procedure Description Patient positioned in bed supine.  Sedation given as noted above.  Patient was intubated with a 58m endotracheal tube using Glidescope.  View was Grade 1 full glottis .  Number of attempts was 1.  Colorimetric CO2 detector was consistent with tracheal placement.  Generalized edema with clear secretions.  Complications/Tolerance None; patient tolerated the procedure well. Chest X-ray is ordered to verify placement.  RKipp Brood MD [facility] [place] Pager: [phone] Epic Secure Chat After hours: [phone].  02/02/2022, 12:02 PM

## 2022-02-02 NOTE — Progress Notes (Deleted)
Pt transported to MRI and back to 1S92 without complications. RT will monitor.

## 2022-02-02 NOTE — Progress Notes (Signed)
[place] for Infectious Disease    Date of Admission:  01/22/2022   Total days of antibiotics 5          ID: Alyssa Ingram is a 76 y.o. female with  enterococcal bacteremia Principal Problem:   Cardiac arrest ([place])    Subjective: Briefly extubated but required re-intubation this early afternoon. Still having low grade fevers. Leukocytosis improving. Having increase stool output. Ct of abdomen yesterday unrevealing  Medications:   acetaminophen  650 mg Per Tube Q6H   Chlorhexidine Gluconate Cloth  6 each Topical Daily   furosemide  80 mg Intravenous BID   heparin  5,000 Units Subcutaneous Q8H   insulin aspart  0-15 Units Subcutaneous Q4H   insulin aspart  5 Units Subcutaneous Q4H   ipratropium-albuterol  3 mL Nebulization Q6H   lidocaine  2 patch Transdermal Q24H   multivitamin  15 mL Per Tube Daily   mouth rinse  15 mL Mouth Rinse Q2H   pantoprazole  40 mg Per Tube Daily   sodium chloride flush  10-40 mL Intracatheter Q12H    Objective: Vital signs in last 24 hours: Temp:  [97.5 F (36.4 C)-100.8 F (38.2 C)] 99.3 F (37.4 C) (09/21 1500) Pulse Rate:  [64-97] 75 (09/21 1500) Resp:  [19-45] 24 (09/21 1500) BP: (79-136)/(58-118) 125/71 (09/21 1500) SpO2:  [78 %-100 %] 94 % (09/21 1511) FiO2 (%):  [40 %-100 %] 50 % (09/21 1511) Weight:  [82.1 kg] 82.1 kg (09/21 0500)  Physical Exam  Constitutional:  sedated. appears well-developed and well-nourished. No distress.  HENT: oett in place Mouth/Throat: Oropharynx is clear and moist. No oropharyngeal exudate.  Cardiovascular: Normal rate, regular rhythm and normal heart sounds. Exam reveals no gallop and no friction rub.  No murmur heard.  Pulmonary/Chest: Effort normal and breath sounds normal. No respiratory distress.  has no wheezes.  Ext: nonpitting edema of LE bilaterally Skin: Skin is warm and dry. No rash noted. No erythema.     Lab Results Recent Labs    02/01/22 0326 02/02/22 0441 02/02/22 1243   WBC 24.4* 20.1*  --   HGB 7.8* 7.3* 9.2*  HCT 23.6* 21.8* 27.0*  NA 141 136 140  K 3.7 4.2 4.5  CL 107 103  --   CO2 25 24  --   BUN 37* 42*  --   CREATININE 1.78* 1.63*  --    Liver Panel Recent Labs    02/01/22 0326 02/02/22 0441  PROT 6.6 6.6  ALBUMIN 2.0* 1.9*  AST 31 24  ALT 58* 41  ALKPHOS 80 75  BILITOT 0.6 0.8   Sedimentation Rate No results for input(s): "ESRSEDRATE" in the last 72 hours. C-Reactive Protein No results for input(s): "CRP" in the last 72 hours.  Microbiology:  9/20 blood cx NGTD at 24hr 9/19 blood cx 2/2 enterococcal faecalis Studies/Results: DG Abd Portable 1V  Result Date: 02/02/2022 CLINICAL DATA:  Enteric catheter placement EXAM: PORTABLE ABDOMEN - 1 VIEW COMPARISON:  02/07/2022 FINDINGS: Frontal view of the lower chest and upper abdomen demonstrates enteric catheter coiled over the gastric fundus. Bowel gas pattern is unremarkable. Patchy bibasilar airspace disease. Left pleural effusion. IMPRESSION: 1. Enteric catheter tip coiled over the gastric fundus. Electronically Signed   By: Randa Ngo M.D.   On: 02/02/2022 17:10   DG CHEST PORT 1 VIEW  Result Date: 02/02/2022 CLINICAL DATA:  Endotracheal tube placement. EXAM: PORTABLE CHEST 1 VIEW COMPARISON:  February 01, 2022 FINDINGS: Endotracheal tube terminates  1 cm above the carina. Retraction may be considered. Enlarged cardiac silhouette. Calcific atherosclerotic disease and tortuosity of the aorta. Patchy airspace opacities throughout both lungs with worsening overall aeration when compared to patient's most recent radiograph. No evidence of pneumothorax. Small bilateral pleural effusions. IMPRESSION: 1. Endotracheal tube terminates 1 cm above the carina. Retraction may be considered. 2. Patchy airspace opacities throughout both lungs with worsening overall aeration when compared to patient's most recent radiograph. 3. Small bilateral pleural effusions. Electronically Signed   By: Fidela Salisbury M.D.   On: 02/02/2022 13:16   CT ABDOMEN PELVIS WO CONTRAST  Result Date: 02/01/2022 CLINICAL DATA:  Sepsis.  Enterococcal bacteremia. EXAM: CT ABDOMEN AND PELVIS WITHOUT CONTRAST TECHNIQUE: Multidetector CT imaging of the abdomen and pelvis was performed following the standard protocol without IV contrast. RADIATION DOSE REDUCTION: This exam was performed according to the departmental dose-optimization program which includes automated exposure control, adjustment of the mA and/or kV according to patient size and/or use of iterative reconstruction technique. COMPARISON:  None Available. FINDINGS: Lower chest: Moderate-sized bilateral pleural effusions with overlying atelectasis or infiltrate. Aspiration would be a consideration. The heart is normal in size. No pericardial effusion. There is an NG tube coursing down the esophagus and into the stomach. Hepatobiliary: No hepatic lesions or intrahepatic biliary dilatation. The gallbladder contains gallstones and sludge. No common bile duct dilatation. Pancreas: No mass, inflammation or ductal dilatation. Spleen: Normal size.  No focal lesions. Adrenals/Urinary Tract: The adrenal glands are unremarkable. Numerous left renal calculi but no renal lesions or hydronephrosis. Bladder contains a Foley catheter. Stomach/Bowel: The stomach, duodenum, small bowel and colon grossly normal. No acute inflammatory process or obstructive findings. Moderate sigmoid colon diverticulosis but no findings for acute diverticulitis. Vascular/Lymphatic: Scattered atherosclerotic calcifications involving the aorta and iliac arteries. No mesenteric or retroperitoneal mass or adenopathy. Reproductive: The uterus is surgically absent. Both ovaries are still present and are grossly normal. Other: Significant pelvic floor relaxation with suspected rectocele, vaginocele and cystocele. Mild diffuse body wall edema. Musculoskeletal: No significant bony findings. Osteoporosis and  degenerative changes involving the spine. IMPRESSION: 1. Moderate-sized bilateral pleural effusions with overlying atelectasis or infiltrate. Aspiration would be a consideration. 2. Cholelithiasis and gallbladder sludge. 3. Numerous left renal calculi. 4. No findings for intra-abdominal/intrapelvic abscess. 5. Significant pelvic floor relaxation with suspected rectocele, vaginocele and cystocele. Aortic Atherosclerosis (ICD10-I70.0). Electronically Signed   By: Marijo Sanes M.D.   On: 02/01/2022 11:18   DG CHEST PORT 1 VIEW  Result Date: 02/01/2022 CLINICAL DATA:  Endotracheal tube.  Cardiac arrest. EXAM: PORTABLE CHEST 1 VIEW COMPARISON:  Chest radiograph 01/31/2022 FINDINGS: Endotracheal tube terminates approximately 2-3 cm above the carina, unchanged from prior. Enteric tube courses below diaphragm with the side hole and tip projecting over the expected location of the stomach. There is a presumed right arm PICC which likely terminates in the left brachiocephalic vein. Cardiomegaly. No pneumothorax. No pleural effusion. Slight interval increase versus new hazy right basilar pulmonary opacity could represent aspiration or infection. There are mildly displaced fractures of right lateral ribs 3 through 5. IMPRESSION: 1. Endotracheal tube terminates approximately 2-3 cm above the carina, unchanged from prior. 2. If there is a right arm PICC in place, terminates in the left brachiocephalic vein. 3. There is a new hazy right basilar pulmonary opacity, which could represent atelectasis, but superimposed infection or aspiration is difficult to exclude. 4. There are mildly displaced fractures of right lateral third through fifth ribs. No radiographic evidence of a  large pneumothorax. Electronically Signed   By: Marin Roberts M.D.   On: 02/01/2022 08:24     Assessment/Plan: Enterococcal faecalis bacteremia = source still somewhat unclear. Once clinically stable, would recommend TEE, imaging of spine to look for  other sources of infection. Continue on amp/sub  Aspiration pneumonia = covered with amp/sub  Venous access = her picc line placed in the setting of bacteremia/ will consider discontinue once no longer needed  [facility] Pager: [phone]  02/02/2022, 5:23 PM

## 2022-02-02 NOTE — Progress Notes (Signed)
NAME:  Alyssa Ingram, MRN:  [id number], DOB:  1945/11/23, LOS: 3 ADMISSION DATE:  01/26/2022, CONSULTATION DATE:  01/28/2022 REFERRING MD:  Leonette Monarch, CHIEF COMPLAINT:  abdominal and neck pain, loss of consciousness  History of Present Illness:  76 yo woman with hx HFpEF, HOCM, asthma, GERD, HLD, HTN, Obesity, DMII, here after PEA cardiac arrest at home.    History is provided by son who lives with the patient.   She has been feeling poorly, including weakness and malaise for several days.  Chronic leg and foot pain was worse than usual.  This was followed by several days of abdominal discomfort, though no diarrhea or nausea or vomiting.  She was incontinent of stool once which is unusual for her.  9/17 she also developed neck pain.  She usually sleeps laying flat and was able to sleep Saturday night.  Since yesterday, they have seen her leaning over the sink and counter bracing herself.    No noticeable sob or cough.  Not complaining of dysuria.    She was feeling worse, becoming weaker and they had decided to take her to the ED, when she started to get worse and more altered.  911 called, and she had completely lost consciousness at that time. When ems arrived, breathing was agonal and she was pulseless. 22 min of cpr, rosc, king airway in the field.    In ED:  [facility] airway removed and intubated.  She was doing enough to fight the tube that she did receive doses of sedation in ed.  On levophed 81mg  LR 1 L ordered  Zosyn vanc ordered.  Sedation given to intubate, etomidate and rocuronium, as well as epi following intubation ('1mg'$ ) at around 430 am.   Echo from 06/19/21  1. IVS thickneess ~24 mm at the basal septum. LVOT gradients not fully  assessed. HOCM. Left ventricular ejection fraction, by estimation, is 70  to 75%. The left ventricle has hyperdynamic function. The left ventricle  has no regional wall motion  abnormalities. There is severe asymmetric left ventricular hypertrophy of  the  basal-septal segment. Left ventricular diastolic parameters are  indeterminate.   2. Right ventricular systolic function is normal. The right ventricular  size is normal. There is severely elevated pulmonary artery systolic  pressure.   Pertinent  Medical History   Past Medical History:  Diagnosis Date   Abnormal MRI, spine 11/2011   ?discitis L5-S1, s/p CT bx 12/12/11   Allergic rhinitis, cause unspecified 01/31/2013   Arthritis    Asthma    [place]    Chronic diastolic heart failure (HCC)    Diabetic retinopathy (HCC)    GERD (gastroesophageal reflux disease)    H/O cardiovascular stress test    Nuclear study in 2011 normal perfusion   HOCM (hypertrophic obstructive cardiomyopathy) ([place]    Echo 12/19/11: Severe LVH, EF 55-65%, dynamic obstruction, wall motion normal, grade 1 diastolic dysfunction, systolic anterior motion of the mitral valve with mild MS and mild MR, mild LAE, PASP 34   Hyperlipidemia    Hypertension    Kidney stones    Obesity    Type II or unspecified type diabetes mellitus without mention of complication, uncontrolled 01/31/2013    Significant Hospital Events: Including procedures, antibiotic start and stop dates in addition to other pertinent events   9/18 admitted to ICU 9/19 waking up, following commands.  Weaning vasopressors.  Interim History / Subjective:  Now awake and following commands.  Tolerating SBT today.  Objective   Blood pressure 116/68, pulse 87, temperature 99.3 F (37.4 C), resp. rate (!) 45, height 5' (1.524 m), weight 82.1 kg, SpO2 90 %.    Vent Mode: PSV;CPAP FiO2 (%):  [40 %] 40 % Set Rate:  [22 bmp] 22 bmp Vt Set:  [360 mL] 360 mL PEEP:  [5 cmH20-8 cmH20] 5 cmH20 Pressure Support:  [8 cmH20] 8 cmH20 Plateau Pressure:  [20 cmH20-21 cmH20] 21 cmH20   Intake/Output Summary (Last 24 hours) at 02/02/2022 0833 Last data filed at 02/02/2022 0800 Gross per 24 hour  Intake 2292.31 ml  Output 865 ml  Net 1427.31 ml     Filed Weights   01/31/22 0500 02/01/22 0545 02/02/22 0500  Weight: 80 kg 81.6 kg 82.1 kg    Examination: General: appears frail. , awake following commands  HENT: OG tube, ET tube in place Lungs: clear bilaterally.  Cardiovascular: Distant but normal.  Extremities warm Abdomen: Nd, NBS  Extremities: B edema and thickened skin  Neuro: Awake and following commands with no focal deficits GU: foley  Ancillary tests personally reviewed:  Creatinine improving 1.62 Glucose control has improved.  Elevated BNP and troponin. CT abdomen negative.  Assessment & Plan:   Critically ill due to PEA cardiac arrest secondary to acute decompensated HFpEF requiring mechanical ventilation for acute hypoxic respiratory failure from cardiogenic pulmonary edema and titration of norepinephrine for cardiogenic and distributive shock -Off NE.  - Likely extubation today if passes SBT, -Neurological status has improved and patient likely to make full neurological recovery. -Multimodal pain control for MSK pain following CPR.  Possible aspiration pneumonia -Complete 7 days Unasyn  Possible enterococcal bacteremia with marked leukocytosis -Should be adequately covered by Unasyn. - CT abdomen negative and WBC improving.   Hypertensive cardiomyopathy with demand related ischemia. -Diurese today.  Mild AKI -Follow creatinine.  Best Practice (right click and "Reselect all SmartList Selections" daily)   Diet/type: NPO  tube feeds DVT prophylaxis: prophylactic heparin  GI prophylaxis: PPI Lines: N/A Foley:  N/A Code Status:  full code Last date of multidisciplinary goals of care discussion '[]'$  Discussed plan with son Juanda Crumble at bedside.  Other children on the phone.    CRITICAL CARE Performed by: Kipp Brood   Total critical care time: 40 minutes  Critical care time was exclusive of separately billable procedures and treating other patients.  Critical care was necessary to treat or  prevent imminent or life-threatening deterioration.  Critical care was time spent personally by me on the following activities: development of treatment plan with patient and/or surrogate as well as nursing, discussions with consultants, evaluation of patient's response to treatment, examination of patient, obtaining history from patient or surrogate, ordering and performing treatments and interventions, ordering and review of laboratory studies, ordering and review of radiographic studies, pulse oximetry, re-evaluation of patient's condition and participation in multidisciplinary rounds.  Kipp Brood, MD [facility] ICU Physician [place]  Pager: [phone] Mobile: [phone] After hours: [phone].

## 2022-02-02 NOTE — Significant Event (Signed)
Rapid Response Event Note   Reason for Call :  Unequal pupils. Questionable need to call Code Stroke  LSN prior to arrival to hospital   Initial Focused Assessment:  Pt lying in bed with eyes closed. Pt will open eyes to painful stimulation and move all extremities weakly. She will not follow commands. L pupil-5 and sluggish, R pupil 3 and risk.   T-98.8, HR-75, BP-120/69, RR-26, SpO2-96% on .50 ventilator  Interventions:  CBG-229 CT head STAT Plan of Care:  Await CT results and relay to MD. Continue to monitor pt. Call RRT if further assistance needed.   Event Summary:   MD Notified: Dr. Lucile Shutters notified and assessed pt via camera, Dr. Shearon Stalls came to bedside.  Call Raymond, Fortino Haag Anderson, RN

## 2022-02-02 NOTE — Progress Notes (Signed)
Pt transported with RT, RN and rapid response RN from [place] to CT3 and back without any complications.

## 2022-02-02 NOTE — Progress Notes (Signed)
Pt placed on PS/CPAP 8/5 on 40% and is tolerating well. RT will monitor. 

## 2022-02-02 NOTE — Procedures (Signed)
Extubation Procedure Note  Patient Details:   Name: Alyssa Ingram DOB: 1945/05/21 MRN: [id number]   Airway Documentation:    Vent end date: 02/02/22 Vent end time: 1025   Evaluation  O2 sats: stable throughout Complications: No apparent complications Patient did tolerate procedure well. Bilateral Breath Sounds: Diminished, Rhonchi   Yes  Pt extubated to [place] with RN at bedside. Positive cuff leak noted and pt is tolerating well. RT will monitor.  Cathie Olden 02/02/2022, 10:26 AM

## 2022-02-03 ENCOUNTER — Inpatient Hospital Stay (HOSPITAL_COMMUNITY): Payer: Medicare Other

## 2022-02-03 DIAGNOSIS — R4182 Altered mental status, unspecified: Secondary | ICD-10-CM

## 2022-02-03 DIAGNOSIS — I469 Cardiac arrest, cause unspecified: Secondary | ICD-10-CM | POA: Diagnosis not present

## 2022-02-03 LAB — BASIC METABOLIC PANEL
Anion gap: 10 (ref 5–15)
Anion gap: 12 (ref 5–15)
BUN: 67 mg/dL — ABNORMAL HIGH (ref 8–23)
BUN: 73 mg/dL — ABNORMAL HIGH (ref 8–23)
CO2: 24 mmol/L (ref 22–32)
CO2: 24 mmol/L (ref 22–32)
Calcium: 8.4 mg/dL — ABNORMAL LOW (ref 8.9–10.3)
Calcium: 8.6 mg/dL — ABNORMAL LOW (ref 8.9–10.3)
Chloride: 101 mmol/L (ref 98–111)
Chloride: 104 mmol/L (ref 98–111)
Creatinine, Ser: 2.39 mg/dL — ABNORMAL HIGH (ref 0.44–1.00)
Creatinine, Ser: 2.28 mg/dL — ABNORMAL HIGH (ref 0.44–1.00)
GFR, Estimated: 21 mL/min — ABNORMAL LOW (ref >60)
GFR, Estimated: 22 mL/min — ABNORMAL LOW (ref >60)
Glucose, Bld: 214 mg/dL — ABNORMAL HIGH (ref 70–99)
Glucose, Bld: 203 mg/dL — ABNORMAL HIGH (ref 70–99)
Potassium: 4.7 mmol/L (ref 3.5–5.1)
Potassium: 5 mmol/L (ref 3.5–5.1)
Sodium: 135 mmol/L (ref 135–145)
Sodium: 140 mmol/L (ref 135–145)

## 2022-02-03 LAB — CBC WITH DIFFERENTIAL/PLATELET
Abs Immature Granulocytes: 0.37 10*3/uL — ABNORMAL HIGH (ref 0.00–0.07)
Basophils Absolute: 0.1 10*3/uL (ref 0.0–0.1)
Basophils Relative: 0 %
Eosinophils Absolute: 0 10*3/uL (ref 0.0–0.5)
Eosinophils Relative: 0 %
HCT: 21.8 % — ABNORMAL LOW (ref 36.0–46.0)
Hemoglobin: 7.3 g/dL — ABNORMAL LOW (ref 12.0–15.0)
Immature Granulocytes: 1 %
Lymphocytes Relative: 5 %
Lymphs Abs: 1.3 10*3/uL (ref 0.7–4.0)
MCH: 27 pg (ref 26.0–34.0)
MCHC: 33.5 g/dL (ref 30.0–36.0)
MCV: 80.7 fL (ref 80.0–100.0)
Monocytes Absolute: 1 10*3/uL (ref 0.1–1.0)
Monocytes Relative: 4 %
Neutro Abs: 24.1 10*3/uL — ABNORMAL HIGH (ref 1.7–7.7)
Neutrophils Relative %: 90 %
Platelets: 178 10*3/uL (ref 150–400)
RBC: 2.7 MIL/uL — ABNORMAL LOW (ref 3.87–5.11)
RDW: 16.8 % — ABNORMAL HIGH (ref 11.5–15.5)
WBC: 26.7 10*3/uL — ABNORMAL HIGH (ref 4.0–10.5)
nRBC: 0.3 % — ABNORMAL HIGH (ref 0.0–0.2)

## 2022-02-03 LAB — GLUCOSE, CAPILLARY
Glucose-Capillary: 175 mg/dL — ABNORMAL HIGH (ref 70–99)
Glucose-Capillary: 180 mg/dL — ABNORMAL HIGH (ref 70–99)
Glucose-Capillary: 189 mg/dL — ABNORMAL HIGH (ref 70–99)
Glucose-Capillary: 198 mg/dL — ABNORMAL HIGH (ref 70–99)
Glucose-Capillary: 213 mg/dL — ABNORMAL HIGH (ref 70–99)
Glucose-Capillary: 214 mg/dL — ABNORMAL HIGH (ref 70–99)
Glucose-Capillary: 217 mg/dL — ABNORMAL HIGH (ref 70–99)

## 2022-02-03 LAB — AMMONIA: Ammonia: 16 umol/L (ref 9–35)

## 2022-02-03 LAB — SARS CORONAVIRUS 2 BY RT PCR: SARS Coronavirus 2 by RT PCR: NEGATIVE

## 2022-02-03 LAB — MAGNESIUM: Magnesium: 2.5 mg/dL — ABNORMAL HIGH (ref 1.7–2.4)

## 2022-02-03 MED ORDER — SODIUM CHLORIDE 0.9 % IV SOLN
2.0000 g | Freq: Two times a day (BID) | INTRAVENOUS | Status: DC
  Administered 2022-02-03 – 2022-02-12 (×19): 2 g via INTRAVENOUS
  Filled 2022-02-03 (×22): qty 20

## 2022-02-03 MED ORDER — SODIUM CHLORIDE 0.9 % IV SOLN
2.0000 g | Freq: Three times a day (TID) | INTRAVENOUS | Status: DC
  Administered 2022-02-03 – 2022-02-08 (×14): 2 g via INTRAVENOUS
  Filled 2022-02-03 (×15): qty 2000

## 2022-02-03 NOTE — Progress Notes (Signed)
NAME:  Alyssa Ingram, MRN:  [id number], DOB:  Sep 19, 1945, LOS: 4 ADMISSION DATE:  02/06/2022, CONSULTATION DATE:  02/04/2022 REFERRING MD:  Leonette Monarch, CHIEF COMPLAINT:  abdominal and neck pain, loss of consciousness  History of Present Illness:  76 yo woman with hx HFpEF, HOCM, asthma, GERD, HLD, HTN, Obesity, DMII, here after PEA cardiac arrest at home.    History is provided by son who lives with the patient.   She has been feeling poorly, including weakness and malaise for several days.  Chronic leg and foot pain was worse than usual.  This was followed by several days of abdominal discomfort, though no diarrhea or nausea or vomiting.  She was incontinent of stool once which is unusual for her.  9/17 she also developed neck pain.  She usually sleeps laying flat and was able to sleep Saturday night.  Since yesterday, they have seen her leaning over the sink and counter bracing herself.    No noticeable sob or cough.  Not complaining of dysuria.    She was feeling worse, becoming weaker and they had decided to take her to the ED, when she started to get worse and more altered.  911 called, and she had completely lost consciousness at that time. When ems arrived, breathing was agonal and she was pulseless. 22 min of cpr, rosc, king airway in the field.    In ED:  [facility] airway removed and intubated.  She was doing enough to fight the tube that she did receive doses of sedation in ed.  On levophed 61mg  LR 1 L ordered  Zosyn vanc ordered.  Sedation given to intubate, etomidate and rocuronium, as well as epi following intubation ('1mg'$ ) at around 430 am.   Echo from 06/19/21  1. IVS thickneess ~24 mm at the basal septum. LVOT gradients not fully  assessed. HOCM. Left ventricular ejection fraction, by estimation, is 70  to 75%. The left ventricle has hyperdynamic function. The left ventricle  has no regional wall motion  abnormalities. There is severe asymmetric left ventricular hypertrophy of  the  basal-septal segment. Left ventricular diastolic parameters are  indeterminate.   2. Right ventricular systolic function is normal. The right ventricular  size is normal. There is severely elevated pulmonary artery systolic  pressure.   Pertinent  Medical History   Past Medical History:  Diagnosis Date   Abnormal MRI, spine 11/2011   ?discitis L5-S1, s/p CT bx 12/12/11   Allergic rhinitis, cause unspecified 01/31/2013   Arthritis    Asthma    [place]    Chronic diastolic heart failure (HCC)    Diabetic retinopathy (HCC)    GERD (gastroesophageal reflux disease)    H/O cardiovascular stress test    Nuclear study in 2011 normal perfusion   [place] ([place]    Echo 12/19/11: Severe LVH, EF 55-65%, dynamic obstruction, wall motion normal, grade 1 diastolic dysfunction, systolic anterior motion of the mitral valve with mild MS and mild MR, mild LAE, PASP 34   Hyperlipidemia    Hypertension    Kidney stones    Obesity    Type II or unspecified type diabetes mellitus without mention of complication, uncontrolled 01/31/2013    Significant Hospital Events: Including procedures, antibiotic start and stop dates in addition to other pertinent events   9/18 admitted to ICU 9/19 waking up, following commands.  Weaning vasopressors. 9/20 diuresed, passed SBT and extubated, but reintubated for upper airway edema. 9/21 less responsive on  no sedation. MRI, EEG ordered.  Interim History / Subjective:  Less responsive than prior to re-intubation but remains off pressors and is tolerating weaning.  Objective   Blood pressure 122/70, pulse 81, temperature 98.4 F (36.9 C), temperature source Axillary, resp. rate (!) 23, height 5' (1.524 m), weight 83.4 kg, SpO2 91 %.    Vent Mode: PSV;CPAP FiO2 (%):  [40 %-100 %] 40 % Set Rate:  [22 bmp-26 bmp] 26 bmp Vt Set:  [360 mL] 360 mL PEEP:  [5 cmH20] 5 cmH20 Pressure Support:  [8 cmH20] 8 cmH20 Plateau  Pressure:  [24 cmH20] 24 cmH20   Intake/Output Summary (Last 24 hours) at 02/03/2022 0911 Last data filed at 02/03/2022 0700 Gross per 24 hour  Intake 936.8 ml  Output 630 ml  Net 306.8 ml    Filed Weights   02/01/22 0545 02/02/22 0500 02/03/22 0500  Weight: 81.6 kg 82.1 kg 83.4 kg    Examination: General: appears frail. , obtunded off sedation.  HENT: OG tube, ET tube in place Lungs: clear bilaterally.  Cardiovascular: S1,S2, S4. Extremities warm.  Abdomen: Nd, NBS  Extremities: B edema and thickened skin  Neuro: no response to verbal stimulation . Moves feet to pain .  GU: foley  Ancillary tests personally reviewed:  CT head unchanged.  Assessment & Plan:   Critically ill due to PEA cardiac arrest secondary to acute decompensated HFpEF requiring mechanical ventilation for acute hypoxic respiratory failure from cardiogenic pulmonary edema and titration of norepinephrine for cardiogenic and distributive shock -Re-intubation for upper airway edema.  Steroids given - Tolerating weaning again.   Unexplained encephalopathy since reintubation - EEG -MRI  Possible enterococcal bacteremia with marked leukocytosis possible from aspiration pneumonia -Should be adequately covered by Unasyn. - CT abdomen negative and WBC improving.  - Further investigations, including TEE will depend on results of MRI  Hypertensive cardiomyopathy with demand related ischemia. -Diurese today.  Mild AKI -Follow creatinine.  Best Practice (right click and "Reselect all SmartList Selections" daily)   Diet/type: NPO  tube feeds DVT prophylaxis: prophylactic heparin  GI prophylaxis: PPI Lines: N/A Foley:  N/A Code Status:  full code Last date of multidisciplinary goals of care discussion '[]'$  Discussed plan with son Juanda Crumble at bedside.  Other children on the phone.    CRITICAL CARE Performed by: Kipp Brood   Total critical care time: 40 minutes  Critical care time was exclusive of  separately billable procedures and treating other patients.  Critical care was necessary to treat or prevent imminent or life-threatening deterioration.  Critical care was time spent personally by me on the following activities: development of treatment plan with patient and/or surrogate as well as nursing, discussions with consultants, evaluation of patient's response to treatment, examination of patient, obtaining history from patient or surrogate, ordering and performing treatments and interventions, ordering and review of laboratory studies, ordering and review of radiographic studies, pulse oximetry, re-evaluation of patient's condition and participation in multidisciplinary rounds.  Kipp Brood, MD [facility] ICU Physician [place]  Pager: [phone] Mobile: [phone] After hours: [phone].

## 2022-02-03 NOTE — Inpatient Diabetes Management (Signed)
Inpatient Diabetes Program Recommendations  AACE/ADA: New Consensus Statement on Inpatient Glycemic Control (2015)  Target Ranges:  Prepandial:   less than 140 mg/dL      Peak postprandial:   less than 180 mg/dL (1-2 hours)      Critically ill patients:  140 - 180 mg/dL    Latest Reference Range & Units 02/01/22 23:23 02/02/22 04:50 02/02/22 08:10 02/02/22 11:58 02/02/22 16:06 02/02/22 20:49  Glucose-Capillary 70 - 99 mg/dL 131 (H)  2 units Novolog  179 (H)  3 units Novolog 171 (H)  3 units Novolog 162 (H)  3 units Novolog 221 (H)  5 units Novolog 229 (H)  10 units Novolog  (H): Data is abnormally high  Latest Reference Range & Units 02/03/22 00:31 02/03/22 03:53 02/03/22 08:31 02/03/22 11:07  Glucose-Capillary 70 - 99 mg/dL 214 (H)  10 units Novolog 217 (H)  10 units Novolog 189 (H) 213 (H)  8 units Novolog  (H): Data is abnormally high     Home DM Meds: Glipizide 2.5 mg daily        Dexcom CGM   Current Orders: Novolog Moderate Correction Scale/ SSI (0-15 units) Q4 hours      Novolog 5 units Q4 hours    Note tube feeds running 55cc/hr  CBGs >200   MD- Please consider increasing the Novolog Tube Feed Coverage to 7 units Q4 hours    --Will follow patient during hospitalization--  Wyn Quaker RN, MSN, [place] Diabetes Coordinator Inpatient Glycemic Control Team Team Pager: [phone] (8a-5p)

## 2022-02-03 NOTE — Progress Notes (Signed)
[facility] for Infectious Disease    Date of Admission:  02/09/2022   Total days of antibiotics 6          ID: Alyssa Ingram is a 76 y.o. 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 EEG showing diffuse encephalopathy. NCHCT suggestive of acute stroke and patient is being prepared to go to MRI. FiO2 decreased to 40 down for 60%. Not needing pressors at the moment.still some loose stools using flexiseal collection system  Labs showing increased leukocytosis     Medications:   acetaminophen  650 mg Per Tube Q6H   Chlorhexidine Gluconate Cloth  6 each Topical Daily   heparin  5,000 Units Subcutaneous Q8H   insulin aspart  0-15 Units Subcutaneous Q4H   insulin aspart  5 Units Subcutaneous Q4H   ipratropium-albuterol  3 mL Nebulization Q6H   lidocaine  2 patch Transdermal Q24H   multivitamin  15 mL Per Tube Daily   mouth rinse  15 mL Mouth Rinse Q2H   pantoprazole  40 mg Per Tube Daily   sodium chloride flush  10-40 mL Intracatheter Q12H    Objective: Vital signs in last 24 hours: Temp:  [98.4 F (36.9 C)-99.5 F (37.5 C)] 98.5 F (36.9 C) (09/22 1109) Pulse Rate:  [73-86] 74 (09/22 1400) Resp:  [18-40] 18 (09/22 1400) BP: (111-143)/(65-87) 129/80 (09/22 1400) SpO2:  [85 %-100 %] 99 % (09/22 1400) FiO2 (%):  [40 %-50 %] 40 % (09/22 1200) Weight:  [83.4 kg] 83.4 kg (09/22 0500) Physical Exam  Constitutional:  appears well-developed and well-nourished. No distress.  HENT: [place]/AT, conjunctiva edema. No scleral icterus Mouth/Throat: OETT in place Cardiovascular: Normal rate, regular rhythm and normal heart sounds. Exam reveals no gallop and no friction rub.  No murmur heard.  Pulmonary/Chest: Effort normal and breath sounds normal. No respiratory distress.  has no wheezes.  Neck = supple, no nuchal rigidity Abdominal: Soft. Bowel sounds are  normal.  exhibits no distension. There is no tenderness.  Lymphadenopathy: no cervical adenopathy. No axillary adenopathy    Lab Results Recent Labs    02/02/22 0441 02/02/22 1243 02/03/22 0936  WBC 20.1*  --  26.7*  HGB 7.3* 9.2* 7.3*  HCT 21.8* 27.0* 21.8*  NA 136 140 135  K 4.2 4.5 4.7  CL 103  --  101  CO2 24  --  24  BUN 42*  --  67*  CREATININE 1.63*  --  2.39*   Liver Panel Recent Labs    02/01/22 0326 02/02/22 0441  PROT 6.6 6.6  ALBUMIN 2.0* 1.9*  AST 31 24  ALT 58* 41  ALKPHOS 80 75  BILITOT 0.6 0.8   Sedimentation Rate No results for input(s): "ESRSEDRATE" in the last 72 hours. C-Reactive Protein No results for input(s): "CRP" in the last 72 hours.  Microbiology: 9/20 blood cx NGTD 9/18 blood cx enterococcus Studies/Results: EEG adult  Result Date: 02/03/2022 Alyssa Havens, MD     02/03/2022 11:09 AM Patient Name: Alyssa Ingram MRN: [id number] Epilepsy Attending: Lora Ingram Referring Physician/Provider: Kipp Brood, MD Date: 02/03/2022 Duration: 23.55 mins Patient history: 76yo F with ams. EEG to evaluate for seizure Level of alertness: lethargic AEDs during EEG study: None Technical aspects: This EEG study was done with scalp electrodes positioned according to the 10-20 International system of electrode placement. Electrical activity was reviewed  with band pass filter of 1-'70Hz'$ , sensitivity of 7 uV/mm, display speed of 23m/sec with a '60Hz'$  notched filter applied as appropriate. EEG data were recorded continuously and digitally stored.  Video monitoring was available and reviewed as appropriate. Description: EEG showed near continuous generalized high amplitude 3 to 5 Hz theta-delta slowing admixed with 1 to 2 seconds of generalized EEG suppression.  Hyperventilation and photic stimulation were not performed.   ABNORMALITY - Continuous slow, generalized IMPRESSION: This study is suggestive of moderate to severe diffuse encephalopathy, nonspecific  etiology. No seizures or epileptiform discharges were seen throughout the recording. Alyssa Ingram  CT HEAD WO CONTRAST (5MM)  Result Date: 02/03/2022 CLINICAL DATA:  Altered mental status, nontraumatic (Ped 0-17y) decreased responsiveness, unequal pupils, patient reintubated earlier today for AMS EXAM: CT HEAD WITHOUT CONTRAST TECHNIQUE: Contiguous axial images were obtained from the base of the skull through the vertex without intravenous contrast. RADIATION DOSE REDUCTION: This exam was performed according to the departmental dose-optimization program which includes automated exposure control, adjustment of the mA and/or kV according to patient size and/or use of iterative reconstruction technique. COMPARISON:  CT head 01/23/2022 BRAIN: BRAIN Cerebral ventricle sizes are concordant with the degree of cerebral volume loss. Patchy and confluent areas of decreased attenuation are noted throughout the deep and periventricular white matter of the cerebral hemispheres bilaterally, compatible with chronic microvascular ischemic disease. Redemonstration of chronic pons, bilateral basal ganglia, and right occipitotemporal lobe infarctions. Query acute loss of gray-white matter differentiation within the left occipital lobe (6:37). No evidence of large-territorial acute infarction. No parenchymal hemorrhage. No mass lesion. No extra-axial collection. No mass effect or midline shift. No hydrocephalus. Basilar cisterns are patent. Vascular: No hyperdense vessel. Atherosclerotic calcifications are present within the cavernous internal carotid arteries. Skull: No acute fracture or focal lesion. Sinuses/Orbits: Paranasal sinuses and mastoid air cells are clear. The orbits are unremarkable. Other: Partially visualized endotracheal and enteric tube. IMPRESSION: 1. Query acute loss of gray-white matter differentiation within the left occipital lobe. Finding concerning for possible acute infarction in a patient with chronic  pons, bilateral basal ganglia, and right occipitotemporal lobe infarctions. 2. No acute intracranial hemorrhage. These results will be called to the ordering clinician or representative by the Radiologist Assistant, and communication documented in the PACS or [employer] Electronically Signed   By: MIven FinnM.D.   On: 02/03/2022 00:02   DG Abd Portable 1V  Result Date: 02/02/2022 CLINICAL DATA:  Enteric catheter placement EXAM: PORTABLE ABDOMEN - 1 VIEW COMPARISON:  01/17/2022 FINDINGS: Frontal view of the lower chest and upper abdomen demonstrates enteric catheter coiled over the gastric fundus. Bowel gas pattern is unremarkable. Patchy bibasilar airspace disease. Left pleural effusion. IMPRESSION: 1. Enteric catheter tip coiled over the gastric fundus. Electronically Signed   By: MRanda NgoM.D.   On: 02/02/2022 17:10   DG CHEST PORT 1 VIEW  Result Date: 02/02/2022 CLINICAL DATA:  Endotracheal tube placement. EXAM: PORTABLE CHEST 1 VIEW COMPARISON:  February 01, 2022 FINDINGS: Endotracheal tube terminates 1 cm above the carina. Retraction may be considered. Enlarged cardiac silhouette. Calcific atherosclerotic disease and tortuosity of the aorta. Patchy airspace opacities throughout both lungs with worsening overall aeration when compared to patient's most recent radiograph. No evidence of pneumothorax. Small bilateral pleural effusions. IMPRESSION: 1. Endotracheal tube terminates 1 cm above the carina. Retraction may be considered. 2. Patchy airspace opacities throughout both lungs with worsening overall aeration when compared to patient's most recent radiograph. 3. Small bilateral pleural  effusions. Electronically Signed   By: Fidela Salisbury M.D.   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 Recommend to get TEE if feasible  Acute respiratory distress /vent dependence= continue on vent support and management per pulm critical care  Aspiration pneumonia = would still be covered with amp and ceftriaxone  [facility] for Infectious Diseases Pager: [phone]  02/03/2022, 2:26 PM

## 2022-02-03 NOTE — Progress Notes (Signed)
Pt transported to MRI and back to 1G33 without complications. RT will monitor.

## 2022-02-03 NOTE — Progress Notes (Signed)
Pt placed on PS/CPAP 8/5 on 40% and is tolerating well. RT will monitor. 

## 2022-02-03 NOTE — Progress Notes (Signed)
eLink Physician-Brief Progress Note Patient Name: Alyssa Ingram DOB: 07-27-1945 MRN: [id number]   Date of Service  02/03/2022  HPI/Events of Note  CT scan results reviewed, subtle pons findings likely require MRI for proper characterization, no acute actionable CT findings.  eICU Interventions  MRI brain has already been ordered.        Kerry Kass Whitten Andreoni 02/03/2022, 12:29 AM

## 2022-02-03 NOTE — Procedures (Signed)
Patient Name: Alyssa Ingram  MRN: [id number]  Epilepsy Attending: Lora Havens  Referring Physician/Provider: Kipp Brood, MD  Date: 02/03/2022 Duration: 23.55 mins  Patient history: 76yo F with ams. EEG to evaluate for seizure  Level of alertness: lethargic   AEDs during EEG study: None  Technical aspects: This EEG study was done with scalp electrodes positioned according to the 10-20 International system of electrode placement. Electrical activity was reviewed with band pass filter of 1-'70Hz'$ , sensitivity of 7 uV/mm, display speed of 29m/sec with a '60Hz'$  notched filter applied as appropriate. EEG data were recorded continuously and digitally stored.  Video monitoring was available and reviewed as appropriate.  Description: EEG showed near continuous generalized high amplitude 3 to 5 Hz theta-delta slowing admixed with 1 to 2 seconds of generalized EEG suppression.  Hyperventilation and photic stimulation were not performed.     ABNORMALITY - Continuous slow, generalized  IMPRESSION: This study is suggestive of moderate to severe diffuse encephalopathy, nonspecific etiology. No seizures or epileptiform discharges were seen throughout the recording.  Clemens Lachman OBarbra Sarks

## 2022-02-03 NOTE — Progress Notes (Deleted)
PT Cancellation Note  Patient Details Name: Alyssa Ingram MRN: [id number] DOB: [birth year]   Cancelled Treatment:    Reason Eval/Treat Not Completed: RN reports DCCV scheduled for 1p. Will follow-up for PT Evaluation after this as schedule permits.  Mabeline Caras, PT, DPT Acute Rehabilitation Services  Personal: [facility] Office: [place] 02/03/2022, 11:10 AM

## 2022-02-04 ENCOUNTER — Inpatient Hospital Stay (HOSPITAL_COMMUNITY): Payer: Medicare Other

## 2022-02-04 DIAGNOSIS — I469 Cardiac arrest, cause unspecified: Secondary | ICD-10-CM | POA: Diagnosis not present

## 2022-02-04 DIAGNOSIS — G9341 Metabolic encephalopathy: Secondary | ICD-10-CM

## 2022-02-04 DIAGNOSIS — I6312 Cerebral infarction due to embolism of basilar artery: Secondary | ICD-10-CM

## 2022-02-04 DIAGNOSIS — I639 Cerebral infarction, unspecified: Secondary | ICD-10-CM | POA: Diagnosis not present

## 2022-02-04 LAB — BASIC METABOLIC PANEL
Anion gap: 14 (ref 5–15)
BUN: 78 mg/dL — ABNORMAL HIGH (ref 8–23)
CO2: 23 mmol/L (ref 22–32)
Calcium: 8.6 mg/dL — ABNORMAL LOW (ref 8.9–10.3)
Chloride: 103 mmol/L (ref 98–111)
Creatinine, Ser: 2.22 mg/dL — ABNORMAL HIGH (ref 0.44–1.00)
GFR, Estimated: 22 mL/min — ABNORMAL LOW (ref >60)
Glucose, Bld: 156 mg/dL — ABNORMAL HIGH (ref 70–99)
Potassium: 4.2 mmol/L (ref 3.5–5.1)
Sodium: 140 mmol/L (ref 135–145)

## 2022-02-04 LAB — CBC WITH DIFFERENTIAL/PLATELET
Abs Immature Granulocytes: 0.86 10*3/uL — ABNORMAL HIGH (ref 0.00–0.07)
Basophils Absolute: 0 10*3/uL (ref 0.0–0.1)
Basophils Relative: 0 %
Eosinophils Absolute: 0.1 10*3/uL (ref 0.0–0.5)
Eosinophils Relative: 1 %
HCT: 21.9 % — ABNORMAL LOW (ref 36.0–46.0)
Hemoglobin: 7.2 g/dL — ABNORMAL LOW (ref 12.0–15.0)
Immature Granulocytes: 4 %
Lymphocytes Relative: 8 %
Lymphs Abs: 1.8 10*3/uL (ref 0.7–4.0)
MCH: 26.6 pg (ref 26.0–34.0)
MCHC: 32.9 g/dL (ref 30.0–36.0)
MCV: 80.8 fL (ref 80.0–100.0)
Monocytes Absolute: 1.5 10*3/uL — ABNORMAL HIGH (ref 0.1–1.0)
Monocytes Relative: 6 %
Neutro Abs: 18.5 10*3/uL — ABNORMAL HIGH (ref 1.7–7.7)
Neutrophils Relative %: 81 %
Platelets: 235 10*3/uL (ref 150–400)
RBC: 2.71 MIL/uL — ABNORMAL LOW (ref 3.87–5.11)
RDW: 17.1 % — ABNORMAL HIGH (ref 11.5–15.5)
WBC: 22.7 10*3/uL — ABNORMAL HIGH (ref 4.0–10.5)
nRBC: 1.1 % — ABNORMAL HIGH (ref 0.0–0.2)

## 2022-02-04 LAB — GLUCOSE, CAPILLARY
Glucose-Capillary: 158 mg/dL — ABNORMAL HIGH (ref 70–99)
Glucose-Capillary: 144 mg/dL — ABNORMAL HIGH (ref 70–99)
Glucose-Capillary: 134 mg/dL — ABNORMAL HIGH (ref 70–99)
Glucose-Capillary: 154 mg/dL — ABNORMAL HIGH (ref 70–99)
Glucose-Capillary: 122 mg/dL — ABNORMAL HIGH (ref 70–99)
Glucose-Capillary: 112 mg/dL — ABNORMAL HIGH (ref 70–99)

## 2022-02-04 NOTE — Progress Notes (Signed)
Patient began to have labored breathing on pressure support/CPAP mode on the ventilator. RT notified. Ventilator settings changed back to [place] mode per RT.

## 2022-02-04 NOTE — Progress Notes (Signed)
Pt placed PS/CPAP 8/5 on 40% and is tolerating well. RT will monitor.

## 2022-02-04 NOTE — Consult Note (Addendum)
NEURO HOSPITALIST CONSULT NOTE   Requestig physician: Dr. Tacy Learn  Reason for Consult: Prognostication after multifocal strokes in the setting of PEA arrest  History obtained from:  Family and Chart     HPI:                                                                                                                                          Alyssa Ingram is a 76 y.o. female with a PMHx of cervical cancer, DM2, diabetic retinopathy, HLD, HTN,. 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 She initially was arousable, communicative and able to tolerate extubation a few days after the PEA arrest, but then was reintubated on Thursday due to upper airway edema. On 9/22 she underwent MRI which showed multiple embolic/septic strokes. Complicating factors this admission have included enterococcal faecalis bacteremia and acute on chronic HFpEF. Since Thursday she has been unresponsive to external stimuli except for partial eye opening to stimulation.   Past Medical History:  Diagnosis Date   Abnormal MRI, spine 11/2011   ?discitis L5-S1, s/p CT bx 12/12/11   Allergic rhinitis, cause unspecified 01/31/2013   Arthritis    Asthma    Cervical cancer (HCC)    Chronic diastolic heart failure (HCC)    Diabetic retinopathy (HCC)    GERD (gastroesophageal reflux disease)    H/O cardiovascular stress test    Nuclear study in 2011 normal perfusion   HOCM (hypertrophic obstructive cardiomyopathy) ([place])    Echo 12/19/11: Severe LVH, EF 55-65%, dynamic obstruction, wall motion normal, grade 1 diastolic dysfunction, systolic anterior motion of the mitral valve with mild MS and mild MR, mild LAE, PASP 34   Hyperlipidemia    Hypertension    Kidney stones    Obesity    Type II or unspecified type diabetes mellitus without mention of complication, uncontrolled 01/31/2013    Past Surgical History:  Procedure Laterality Date   ABDOMINAL HYSTERECTOMY     JOINT  REPLACEMENT     right knee 2006   rotater cuff     right, 2005    Family History  Problem Relation Age of Onset   Heart disease Father    Arthritis Other    Heart disease Other    Hypertension Other    Diabetes Other    Alcohol abuse Other    Arthritis Other    Cancer Other        Lung Cancer   Heart disease Other    Hypertension Other    Sudden death Other    Kidney disease Other    Mental illness Other    Diabetes Other    Colon cancer Neg Hx  Social History:  reports that she has never smoked. She has never used smokeless tobacco. She reports that she does not drink alcohol and does not use drugs.  Allergies  Allergen Reactions   Sulfa Antibiotics Hives   Metformin And Related Other (See Comments)    intolerance   Prednisone Swelling    MEDICATIONS:                                                                                                                     Scheduled:  acetaminophen  650 mg Per Tube Q6H   Chlorhexidine Gluconate Cloth  6 each Topical Daily   heparin  5,000 Units Subcutaneous Q8H   insulin aspart  0-15 Units Subcutaneous Q4H   insulin aspart  5 Units Subcutaneous Q4H   ipratropium-albuterol  3 mL Nebulization Q6H   lidocaine  2 patch Transdermal Q24H   multivitamin  15 mL Per Tube Daily   mouth rinse  15 mL Mouth Rinse Q2H   pantoprazole  40 mg Per Tube Daily   sodium chloride flush  10-40 mL Intracatheter Q12H   Continuous:  ampicillin (OMNIPEN) IV Stopped (02/04/22 1838)   cefTRIAXone (ROCEPHIN)  IV 2 g (02/04/22 2231)   feeding supplement (VITAL AF 1.2 CAL) 55 mL/hr at 02/04/22 2000     ROS:                                                                                                                                       Unable to obtain due to obtundation.    Blood pressure (!) 142/86, pulse 87, temperature 98.5 F (36.9 C), temperature source Axillary, resp. rate (!) 30, height 5' (1.524 m), weight 83.3 kg, SpO2  93 %.   General Examination:                                                                                                       Physical Exam  HEENT-  [place]/AT  Lungs- Intubated Extremities- Diffuse  edema is noted.    Neurological Examination Mental Status: Intubated and off sedation. Eyes will partially open to stimuli. Does not attempt to communicate. Not following any commands. Not localizing or posturing to noxious or other stimuli.  Cranial Nerves: II: No blink to threat. Sluggishly reactive pupils with asymmetry in the context of left sided cataract.   III,IV, VI: Weak doll's eye reflex. Eyes are conjugate and near the midline.  V,VII: Intact corneal reflexes VIII: No response to voice IX,X: Intubated with intact cough and gag XI: Head is midline XII: Unable to assess Motor/Semsory: Flaccid tone x 4 is symmetric.  No spontaneous movements including no posturing.  Not following any commands.  No movement of upper extremities to any stimuli, including repeated light pinching.  Stereotyped dorsiflexion of toes with weak flexion of knees in response to plantar stimulation that is proportional to the intensity of the applied stimulus.  Deep Tendon Reflexes: 2+ low amplitude brachioradialis and patellar reflexes bilaterally.  Plantars: Upgoing bilaterally Cerebellar/Gait: Unable to assess   Lab Results: Basic Metabolic Panel: Recent Labs  Lab 01/14/2022 0425 01/15/2022 0552 01/22/2022 1522 01/31/22 0550 02/01/22 0326 02/02/22 0441 02/02/22 1243 02/03/22 0936 02/03/22 1728 02/04/22 0436  NA 139   < >  --  133* 141 136 140 135 140 140  K 4.2   < >  --  3.5 3.7 4.2 4.5 4.7 5.0 4.2  CL 103  --   --  100 107 103  --  101 104 103  CO2 20*  --   --  '25 25 24  ' --  '24 24 23  ' GLUCOSE 217*  --   --  327* 159* 182*  --  214* 203* 156*  BUN 28*  --   --  34* 37* 42*  --  67* 73* 78*  CREATININE 1.52*  --   --  1.72* 1.78* 1.63*  --  2.39* 2.28* 2.22*  CALCIUM 8.6*  --   --  8.1* 8.6*  8.3*  --  8.4* 8.6* 8.6*  MG 2.1  --  1.9 1.8 1.9 2.4  --   --  2.5*  --   PHOS 5.6*  --  4.0 2.9 3.2  --   --   --   --   --    < > = values in this interval not displayed.    CBC: Recent Labs  Lab 01/31/22 0550 02/01/22 0326 02/02/22 0441 02/02/22 1243 02/03/22 0936 02/04/22 0436  WBC 21.3* 24.4* 20.1*  --  26.7* 22.7*  NEUTROABS  --  21.1* 17.1*  --  24.1* 18.5*  HGB 8.1* 7.8* 7.3* 9.2* 7.3* 7.2*  HCT 23.6* 23.6* 21.8* 27.0* 21.8* 21.9*  MCV 80.0 81.7 81.0  --  80.7 80.8  PLT 189 212 199  --  178 235    Cardiac Enzymes: No results for input(s): "CKTOTAL", "CKMB", "CKMBINDEX", "TROPONINI" in the last 168 hours.  Lipid Panel: Recent Labs  Lab 02/05/2022 0425  TRIG 25    Imaging: CT HEAD WO CONTRAST (5MM)  Result Date: 02/04/2022 CLINICAL DATA:  Anisocoria EXAM: CT HEAD WITHOUT CONTRAST TECHNIQUE: Contiguous axial images were obtained from the base of the skull through the vertex without intravenous contrast. RADIATION DOSE REDUCTION: This exam was performed according to the departmental dose-optimization program which includes automated exposure control, adjustment of the mA and/or kV according to patient size and/or use of iterative reconstruction technique. COMPARISON:  CT head without contrast 02/02/2022 FINDINGS: Brain: Similar appearance of a subacute  right temporal and parietal scratched at similar appearance of the right temporal and occipital lobe infarct noted. Involvement of the right parietal lobe is stable. Numerous other smaller cortical and subcortical infarcts are less well-defined by CT. No new large territorial infarct is present. Extensive white matter hypoattenuation is again seen. Ventricles are stable. No significant extra-axial fluid collection is present. Vascular: Atherosclerotic calcifications are present within the cavernous internal carotid arteries. No hyperdense vessel is present. Skull: Insert normal skull No significant extracranial soft tissue lesion  is present. Sinuses/Orbits: The paranasal sinuses and mastoid air cells are clear. Right lens replacement is present. Dysconjugate gaze noted. Globes and orbits are otherwise within normal limits. IMPRESSION: 1. Stable appearance of subacute right temporal and parietal lobe infarct. 2. Numerous other smaller cortical and subcortical infarcts are less well-defined by CT. 3. No new large territorial infarct or hemorrhage. 4. Stable advanced white matter disease. This likely reflects the sequela of chronic microvascular ischemia. Electronically Signed   By: San Morelle M.D.   On: 02/04/2022 17:26   MR BRAIN WO CONTRAST  Result Date: 02/03/2022 CLINICAL DATA:  CT brain 02/02/2022 EXAM: MRI HEAD WITHOUT CONTRAST TECHNIQUE: Multiplanar, multiecho pulse sequences of the brain and surrounding structures were obtained without intravenous contrast. COMPARISON:  CT head 02/02/22 FINDINGS: Brain: There are innumerable supra and infratentorial acute infarcts involving the bilateral cerebellar and cerebral hemispheres, as well as the bilateral basal ganglia and left thalamus. Redemonstrated is a larger area infarction involving the right temporal lobe, with associated T2/FLAIR hyperintense signal abnormality compatible with an evolving infarct. There are multiple central and peripherally located sites of microhemorrhages including in the pons. Vascular: Normal flow voids. Skull and upper cervical spine: Normal marrow signal. Sinuses/Orbits: Bilateral mastoid effusions. Right lens replacement. Mucosal thickening bilateral sphenoid sinuses. Other: None. IMPRESSION: 1. There are innumerable supra and infratentorial acute infarcts. Given distribution and patient's history of cardiac arrest, these are likely secondary to a central embolic etiology. 2. Redemonstrated is a an evolving right MCA/PCA territory infarct involving the right temporal lobe. 3. There are multiple central and peripherally positioned sites of  microhemorrhages in the bilateral cerebral and cerebellar hemispheres, as well as the pons. Many of the microhemorrhages are not in sites of infarcts. Although these may be related to a combination of hypertension and potentially underlying microangiopathy, additional differential considerations include coagulopathy (such as DIC) or septic emboli. Electronically Signed   By: Marin Roberts M.D.   On: 02/03/2022 14:13   EEG adult  Result Date: 02/03/2022 Lora Havens, MD     02/03/2022 11:09 AM Patient Name: Alyssa Ingram MRN: [id number] Epilepsy Attending: Lora Havens Referring Physician/Provider: Kipp Brood, MD Date: 02/03/2022 Duration: 23.55 mins Patient history: 76yo F with ams. EEG to evaluate for seizure Level of alertness: lethargic AEDs during EEG study: None Technical aspects: This EEG study was done with scalp electrodes positioned according to the 10-20 International system of electrode placement. Electrical activity was reviewed with band pass filter of 1-'70Hz' , sensitivity of 7 uV/mm, display speed of 86m/sec with a '60Hz'  notched filter applied as appropriate. EEG data were recorded continuously and digitally stored.  Video monitoring was available and reviewed as appropriate. Description: EEG showed near continuous generalized high amplitude 3 to 5 Hz theta-delta slowing admixed with 1 to 2 seconds of generalized EEG suppression.  Hyperventilation and photic stimulation were not performed.   ABNORMALITY - Continuous slow, generalized IMPRESSION: This study is suggestive of moderate to severe diffuse encephalopathy, nonspecific etiology.  No seizures or epileptiform discharges were seen throughout the recording. Priyanka Barbra Sarks   CT HEAD WO CONTRAST (5MM)  Result Date: 02/03/2022 CLINICAL DATA:  Altered mental status, nontraumatic (Ped 0-17y) decreased responsiveness, unequal pupils, patient reintubated earlier today for AMS EXAM: CT HEAD WITHOUT CONTRAST TECHNIQUE: Contiguous axial  images were obtained from the base of the skull through the vertex without intravenous contrast. RADIATION DOSE REDUCTION: This exam was performed according to the departmental dose-optimization program which includes automated exposure control, adjustment of the mA and/or kV according to patient size and/or use of iterative reconstruction technique. COMPARISON:  CT head 02/04/2022 BRAIN: BRAIN Cerebral ventricle sizes are concordant with the degree of cerebral volume loss. Patchy and confluent areas of decreased attenuation are noted throughout the deep and periventricular white matter of the cerebral hemispheres bilaterally, compatible with chronic microvascular ischemic disease. Redemonstration of chronic pons, bilateral basal ganglia, and right occipitotemporal lobe infarctions. Query acute loss of gray-white matter differentiation within the left occipital lobe (6:37). No evidence of large-territorial acute infarction. No parenchymal hemorrhage. No mass lesion. No extra-axial collection. No mass effect or midline shift. No hydrocephalus. Basilar cisterns are patent. Vascular: No hyperdense vessel. Atherosclerotic calcifications are present within the cavernous internal carotid arteries. Skull: No acute fracture or focal lesion. Sinuses/Orbits: Paranasal sinuses and mastoid air cells are clear. The orbits are unremarkable. Other: Partially visualized endotracheal and enteric tube. IMPRESSION: 1. Query acute loss of gray-white matter differentiation within the left occipital lobe. Finding concerning for possible acute infarction in a patient with chronic pons, bilateral basal ganglia, and right occipitotemporal lobe infarctions. 2. No acute intracranial hemorrhage. These results will be called to the ordering clinician or representative by the Radiologist Assistant, and communication documented in the PACS or [employer]. Electronically Signed   By: Iven Finn M.D.   On: 02/03/2022 00:02            Assessment: 76 year old female with multifocal strokes on MRI brain in the context of initial recovery to a lethargic but interactive state following PEA arrest, followed by persistent unresponsiveness following reintubation on Thursday for airway edema - Exam reveals findings most consistent with a vegetative state - MRI brain: There are innumerable supra and infratentorial acute infarcts. Given distribution and patient's history of cardiac arrest, these are likely secondary to a central embolic etiology. Redemonstrated is a an evolving right MCA/PCA territory infarct involving the right temporal lobe. there are multiple central and peripherally positioned sites of microhemorrhages in the bilateral cerebral and cerebellar hemispheres, as well as the pons. Many of the microhemorrhages are not in sites of infarcts. Although these may be related to a ccombination of hypertension and potentially underlying microangiopathy, additional differential considerations include coagulopathy (such as DIC) or septic emboli. - EEG obtained yesterday (9/22): Continuous slow, generalized. This study is suggestive of moderate to severe diffuse encephalopathy, nonspecific etiology. No seizures or epileptiform discharges were seen throughout the recording. - DDx for her unresponsiveness includes subclinical seizures, but this is felt to be unlikely given no epileptiform discharges on yesterday's EEG. More likely would be a multifactorial etiology with contributions from the neurological burden of her numerous multifocal acute strokes on MRI, possible anoxic brain damage from initial PEA arrest, residual sedation in the context of her renal failure with severely reduced eGFR, infectious encephalopathy in the setting of enterococcal bacteremia with concern for endocarditis with CNS emboli and aspiration pneumonia.     Recommendations: - Agree with ID that TEE should be obtained  to evaluate for possible  endocarditis. Also will be useful to assess for possible mural thrombus.  - Empiric ABX for possible septic emboli. Will defer to CCM and ID for dosing regimen.  - Anticoagulation not indicated in cases of suspected septic emboli.  - Supportive care per CCM - Overall neurological prognosis is felt most likely to be poor.  - The patient's neurological condition and prognosis were extensively discussed with her two sons and the bedside today. Imaging was reviewed with them as well. All questions answered.   - Stroke Team to follow on Sunday morning.   60 minutes spent in the neurological evaluation and management of this critically ill patient.   Electronically signed: Dr. Kerney Elbe 02/04/2022, 6:10 PM

## 2022-02-04 NOTE — Progress Notes (Signed)
NAME:  Alyssa Ingram, MRN:  [id number], DOB:  11/10/1945, LOS: 5 ADMISSION DATE:  02/11/2022, CONSULTATION DATE:  01/21/2022 REFERRING MD:  Leonette Monarch, CHIEF COMPLAINT:  abdominal and neck pain, loss of consciousness  History of Present Illness:  76 yo woman with hx HFpEF, HOCM, asthma, GERD, HLD, HTN, Obesity, DMII, here after PEA cardiac arrest at home.    History is provided by son who lives with the patient.   She has been feeling poorly, including weakness and malaise for several days.  Chronic leg and foot pain was worse than usual.  This was followed by several days of abdominal discomfort, though no diarrhea or nausea or vomiting.  She was incontinent of stool once which is unusual for her.  9/17 she also developed neck pain.  She usually sleeps laying flat and was able to sleep Saturday night. Since yesterday, they have seen her leaning over the sink and counter bracing herself.    She was feeling worse, becoming weaker and they had decided to take her to the ED, when she started to get worse and more altered.  911 called, and she had completely lost consciousness at that time. When ems arrived, breathing was agonal and she was pulseless. 22 min of cpr, rosc, king airway in the field.    In ED required intubation  She was doing enough to fight the tube that she did receive doses of sedation in ed. On levophed 61mg. Given Zosyn/vanc.    9/19 waking up and following commands, 9/20 extubated, shortly after intubated for upper airway edema. 9/21 less responsive. 9/22 underwent MRI which showed multiple embolic/septic strokes.   Pertinent  Medical History   Past Medical History:  Diagnosis Date   Abnormal MRI, spine 11/2011   ?discitis L5-S1, s/p CT bx 12/12/11   Allergic rhinitis, cause unspecified 01/31/2013   Arthritis    Asthma    [place]ervical cancer ([place]    Chronic diastolic heart failure (HCC)    Diabetic retinopathy (HCC)    GERD (gastroesophageal reflux disease)    H/O cardiovascular  stress test    Nuclear study in 2011 normal perfusion   HOC[place] ([place]rophic obstructive cardiomyopathy) ([place]    Echo 12/19/11: Severe LVH, EF 55-65%, dynamic obstruction, wall motion normal, grade 1 diastolic dysfunction, systolic anterior motion of the mitral valve with mild MS and mild MR, mild LAE, PASP 34   Hyperlipidemia    Hypertension    Kidney stones    Obesity    Type II or unspecified type diabetes mellitus without mention of complication, uncontrolled 01/31/2013    Significant Hospital Events: Including procedures, antibiotic start and stop dates in addition to other pertinent events   9/18 admitted to ICU 9/19 waking up, following commands.  Weaning vasopressors. 9/20 diuresed, passed SBT and extubated, but reintubated for upper airway edema. 9/21 less responsive on no sedation. MRI, EEG ordered.  Interim History / Subjective:  Remains on vent support. Not following commands, off sedation.   Objective   Blood pressure 118/72, pulse 81, temperature 98.1 F (36.7 C), temperature source Axillary, resp. rate (!) 21, height 5' (1.524 m), weight 83.3 kg, SpO2 95 %.    Vent Mode: PSV;CPAP FiO2 (%):  [40 %] 40 % Set Rate:  [26 bmp] 26 bmp Vt Set:  [360 mL] 360 mL PEEP:  [5 cmH20] 5 cmH20 Pressure Support:  [8 cmH20] 8 cmH20 Plateau Pressure:  [25 cmH20-26 cmH20] 25 cmH20   Intake/Output Summary (Last 24 hours) at 02/04/2022 0805  Last data filed at 02/04/2022 0700 Gross per 24 hour  Intake 1015 ml  Output 880 ml  Net 135 ml   Filed Weights   02/02/22 0500 02/03/22 0500 02/04/22 0435  Weight: 82.1 kg 83.4 kg 83.3 kg    Examination: General: Critically ill appearing adult female, on vent  HENT: OG/ETT in place  Lungs: clear bilaterally.  Cardiovascular: RRR, HR 81, no mRG Abdomen: obese, soft, active bowel sounds  Extremities: generalized BLE edema, non-pitting  Neuro: withdrawals in all extremities with nailbed pressures, does not follow commands, pupils 3 mm  bilaterally and reactive  GU: foley  Ancillary tests personally reviewed:    Assessment & Plan:   PEA cardiac arrest secondary to acute decompensated HFpEF  Cardiogenic and distributive shock > resolved  Hypertensive cardiomyopathy with demand related ischemia. Plan - Cardiac Monitoring  - TEE as below   Acute hypoxic respiratory failure secondary to cardiogenic pulmonary edema   Respiratory Insuffiencey in setting of encephalopathy  - Extubated 9/20, re-intubated shortly after for upper airway edema  Plan - Continue Vent support  - SBT as tolerates  - Trend ABG/CXR   Encephalopathy  Innumerable supra and infratentorial acute infarcts, evolving right MCA/PCA infarct involving in right temporal lobe, multiple central and peripherally positioned sites of micro-hemorrhages  Plan - ID work up as below  - Consult Neurology   Enterococcal bacteremia with marked leukocytosis possible from aspiration pneumonia, concern for endocarditis with CNS emboli  Plan - ID following - Trend WBC and Fever Curve  - Continue Ampicillin and Rocephin  - TEE pending   Mild AKI Plan - Trend BMP   Hyperglycemia  Plan - Trend Glucose  - Scheduled 5 units q4h - SSI   Best Practice (right click and "Reselect all SmartList Selections" daily)   Diet/type: tube feeds DVT prophylaxis: prophylactic heparin  GI prophylaxis: PPI Lines: PICC Right Brachial 9/18  Foley:  Foley 9/18  Code Status:  full code Last date of multidisciplinary goals of care discussion '[]'$  Reviewed/Discussed MRI findings with Son at bedside. Tearful, understands plan.   CRITICAL CARE Performed by: Omar Person   Total critical care time: 32 minutes  Critical care time was exclusive of separately billable procedures and treating other patients.  Critical care was necessary to treat or prevent imminent or life-threatening deterioration.  Critical care was time spent personally by me on the following  activities: development of treatment plan with patient and/or surrogate as well as nursing, discussions with consultants, evaluation of patient's response to treatment, examination of patient, obtaining history from patient or surrogate, ordering and performing treatments and interventions, ordering and review of laboratory studies, ordering and review of radiographic studies, pulse oximetry, re-evaluation of patient's condition and participation in multidisciplinary rounds.

## 2022-02-04 NOTE — Progress Notes (Signed)
Pt transported to CT and back to 0P19 without complications. RT will monitor.

## 2022-02-04 NOTE — Progress Notes (Signed)
Upon assessment, patient's pupils were unequal. Left pupil 29m, with sluggish reaction. Right pupil 3-450m with brisk reaction. [name] aware and at bedside. See new orders.

## 2022-02-05 ENCOUNTER — Inpatient Hospital Stay (HOSPITAL_COMMUNITY): Payer: Medicare Other

## 2022-02-05 DIAGNOSIS — G9341 Metabolic encephalopathy: Secondary | ICD-10-CM | POA: Diagnosis not present

## 2022-02-05 DIAGNOSIS — I469 Cardiac arrest, cause unspecified: Secondary | ICD-10-CM | POA: Diagnosis not present

## 2022-02-05 DIAGNOSIS — I330 Acute and subacute infective endocarditis: Secondary | ICD-10-CM | POA: Diagnosis not present

## 2022-02-05 LAB — BASIC METABOLIC PANEL
Anion gap: 11 (ref 5–15)
BUN: 82 mg/dL — ABNORMAL HIGH (ref 8–23)
CO2: 24 mmol/L (ref 22–32)
Calcium: 8.4 mg/dL — ABNORMAL LOW (ref 8.9–10.3)
Chloride: 107 mmol/L (ref 98–111)
Creatinine, Ser: 1.9 mg/dL — ABNORMAL HIGH (ref 0.44–1.00)
GFR, Estimated: 27 mL/min — ABNORMAL LOW (ref >60)
Glucose, Bld: 120 mg/dL — ABNORMAL HIGH (ref 70–99)
Potassium: 4.3 mmol/L (ref 3.5–5.1)
Sodium: 142 mmol/L (ref 135–145)

## 2022-02-05 LAB — CBC WITH DIFFERENTIAL/PLATELET
Abs Immature Granulocytes: 0 10*3/uL (ref 0.00–0.07)
Basophils Absolute: 0 10*3/uL (ref 0.0–0.1)
Basophils Relative: 0 %
Eosinophils Absolute: 0.2 10*3/uL (ref 0.0–0.5)
Eosinophils Relative: 1 %
HCT: 23 % — ABNORMAL LOW (ref 36.0–46.0)
Hemoglobin: 7.2 g/dL — ABNORMAL LOW (ref 12.0–15.0)
Lymphocytes Relative: 9 %
Lymphs Abs: 1.6 10*3/uL (ref 0.7–4.0)
MCH: 26.2 pg (ref 26.0–34.0)
MCHC: 31.3 g/dL (ref 30.0–36.0)
MCV: 83.6 fL (ref 80.0–100.0)
Monocytes Absolute: 0.9 10*3/uL (ref 0.1–1.0)
Monocytes Relative: 5 %
Neutro Abs: 15 10*3/uL — ABNORMAL HIGH (ref 1.7–7.7)
Neutrophils Relative %: 85 %
Platelets: 239 10*3/uL (ref 150–400)
RBC: 2.75 MIL/uL — ABNORMAL LOW (ref 3.87–5.11)
RDW: 17 % — ABNORMAL HIGH (ref 11.5–15.5)
Smear Review: NORMAL
WBC: 17.6 10*3/uL — ABNORMAL HIGH (ref 4.0–10.5)
nRBC: 2 /100 WBC — ABNORMAL HIGH
nRBC: 3.1 % — ABNORMAL HIGH (ref 0.0–0.2)

## 2022-02-05 LAB — MAGNESIUM: Magnesium: 2.4 mg/dL (ref 1.7–2.4)

## 2022-02-05 LAB — GLUCOSE, CAPILLARY
Glucose-Capillary: 107 mg/dL — ABNORMAL HIGH (ref 70–99)
Glucose-Capillary: 123 mg/dL — ABNORMAL HIGH (ref 70–99)
Glucose-Capillary: 118 mg/dL — ABNORMAL HIGH (ref 70–99)
Glucose-Capillary: 148 mg/dL — ABNORMAL HIGH (ref 70–99)
Glucose-Capillary: 171 mg/dL — ABNORMAL HIGH (ref 70–99)
Glucose-Capillary: 183 mg/dL — ABNORMAL HIGH (ref 70–99)
Glucose-Capillary: 184 mg/dL — ABNORMAL HIGH (ref 70–99)

## 2022-02-05 LAB — PHOSPHORUS: Phosphorus: 4.8 mg/dL — ABNORMAL HIGH (ref 2.5–4.6)

## 2022-02-05 NOTE — Progress Notes (Addendum)
STROKE TEAM PROGRESS NOTE   INTERVAL HISTORY Her daughter and son are at the bedside.   MRI concerning for embolic shower Exam changed for the worse on Thursday after she was reintubated TEE tomorrow, TCD, carotid duplex ordered as well She remains intubated and is in respiratory distress.  She is on antibiotics for bacteremia and ID suspects bacterial endocarditis and TEE is scheduled for tomorrow Vitals:   02/05/22 0600 02/05/22 0630 02/05/22 0700 02/05/22 0754  BP: (!) 151/98 131/84 128/80 135/89  Pulse: 89 81 81 87  Resp: (!) 26 (!) 21 (!) 25 (!) 28  Temp:      TempSrc:      SpO2: 96% 98% 98% 98%  Weight:      Height:       CBC:  Recent Labs  Lab 02/04/22 0436 02/05/22 0341  WBC 22.7* 17.6*  NEUTROABS 18.5* 15.0*  HGB 7.2* 7.2*  HCT 21.9* 23.0*  MCV 80.8 83.6  PLT 235 416   Basic Metabolic Panel:  Recent Labs  Lab 02/01/22 0326 02/02/22 0441 02/03/22 1728 02/04/22 0436 02/05/22 0341  NA 141   < > 140 140 142  K 3.7   < > 5.0 4.2 4.3  CL 107   < > 104 103 107  CO2 25   < > '24 23 24  '$ GLUCOSE 159*   < > 203* 156* 120*  BUN 37*   < > 73* 78* 82*  CREATININE 1.78*   < > 2.28* 2.22* 1.90*  CALCIUM 8.6*   < > 8.6* 8.6* 8.4*  MG 1.9   < > 2.5*  --  2.4  PHOS 3.2  --   --   --  4.8*   < > = values in this interval not displayed.   Lipid Panel:  Recent Labs  Lab 01/14/2022 0425  TRIG 89   HgbA1c:  Recent Labs  Lab 02/07/2022 0850  HGBA1C 7.2*   Urine Drug Screen:  Recent Labs  Lab 02/04/2022 0808  LABOPIA NONE DETECTED  COCAINSCRNUR NONE DETECTED  LABBENZ POSITIVE*  AMPHETMU NONE DETECTED  THCU NONE DETECTED  LABBARB NONE DETECTED    Alcohol Level No results for input(s): "ETH" in the last 168 hours.  IMAGING past 24 hours CT HEAD WO CONTRAST (5MM)  Result Date: 02/04/2022 CLINICAL DATA:  Anisocoria EXAM: CT HEAD WITHOUT CONTRAST TECHNIQUE: Contiguous axial images were obtained from the base of the skull through the vertex without intravenous  contrast. RADIATION DOSE REDUCTION: This exam was performed according to the departmental dose-optimization program which includes automated exposure control, adjustment of the mA and/or kV according to patient size and/or use of iterative reconstruction technique. COMPARISON:  CT head without contrast 02/02/2022 FINDINGS: Brain: Similar appearance of a subacute right temporal and parietal scratched at similar appearance of the right temporal and occipital lobe infarct noted. Involvement of the right parietal lobe is stable. Numerous other smaller cortical and subcortical infarcts are less well-defined by CT. No new large territorial infarct is present. Extensive white matter hypoattenuation is again seen. Ventricles are stable. No significant extra-axial fluid collection is present. Vascular: Atherosclerotic calcifications are present within the cavernous internal carotid arteries. No hyperdense vessel is present. Skull: Insert normal skull No significant extracranial soft tissue lesion is present. Sinuses/Orbits: The paranasal sinuses and mastoid air cells are clear. Right lens replacement is present. Dysconjugate gaze noted. Globes and orbits are otherwise within normal limits. IMPRESSION: 1. Stable appearance of subacute right temporal and parietal lobe infarct. 2. Numerous  other smaller cortical and subcortical infarcts are less well-defined by CT. 3. No new large territorial infarct or hemorrhage. 4. Stable advanced white matter disease. This likely reflects the sequela of chronic microvascular ischemia. Electronically Signed   By: San Morelle M.D.   On: 02/04/2022 17:26    --PHYSICAL EXAM-- General: Critically ill-appearing obese female, orally intubated HEENT: [place]/AT, eyes anicteric.  ETT and OGT in place Chest: Coarse breath sounds, no wheezes or rhonchi Heart: Regular rate and rhythm, no murmurs or gallops Abdomen: Soft, nontender, nondistended, bowel sounds present Skin: No  rash  Neuro Mental Status Intubated and off sedation. Eyes will partially open to stimuli. Does not attempt to communicate. Not following any commands.  Opens eyes to sternal rub but appears aphasic and does not follow commands.  Cranial Nerves II: Inconsistent blink to threat.  Left greater than right sluggishly reactive pupils with asymmetry in the context of left sided cataract.   III,IV, VI: Weak doll's eye reflex. Eyes are conjugate and near the midline.  V,VII: Deferred VIII: No response to voice IX,X: Intubated with weak cough and gag XI: Head is midline XII: Unable to assess  Motor/Semsory: Flaccid tone x 4 is symmetric.  No spontaneous movements including no posturing.  Not following any commands.  Some fluctuate posturing to sternal rub. Movement of BLE and left upper extremity to  sternal rub.  ASSESSMENT/PLAN Alyssa Ingram is a 76 y.o. female with history of PMHx of cervical cancer, DM2, diabetic retinopathy, HLD, HTN,. 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 On 9/22 she underwent MRI which showed multiple embolic/septic strokes. Complicating factors this admission have included enterococcal faecalis bacteremia and acute on chronic HFpEF.  Stroke Innumerable supra and infratentorial acute infarcts, evolving right MCA/PCA infarct involving right temporal lobe, multiple central and peripherally positioned sites of micro-hemorrhages  Etiology:  Likely cardioembolic strong suspicion for endocarditis in the setting of bacteremia CT head Stable subacute right temporal and parietal lobe infarcts, numerous small cortical and subcortical infarcts  MRI  There are innumerable supra and infratentorial acute infarcts. Given distribution and patient's history of cardiac arrest, these are likely secondary to a central embolic etiology. Redemonstrated is a an evolving right MCA/PCA territory infarct involving the right temporal lobe. there are  multiple central and peripherally positioned sites of microhemorrhages in the bilateral cerebral and cerebellar hemispheres, as well as the pons. Many of the microhemorrhages are not in sites of infarcts EEG: (9/22): Continuous slow, generalized. This study is suggestive of moderate to severe diffuse encephalopathy, nonspecific etiology. No seizures or epileptiform discharges were seen throughout the recording. TCD pending Carotid Doppler pending 2D Echo EF 65-70% with grade III diastolic dysfunction, LA severely dilated, RA midly dilated TEE pending on Monday LDL 115 HgbA1c 7.2 VTE prophylaxis - Heparin  aspirin 81 mg daily prior to admission, now on No antithrombotic. given concern for endocarditis Therapy recommendations:  pending Disposition:  pending  Hx hypertension with intermittent hypotension requiring vasopressors Home meds:  Coreg '25mg'$  Stable- off vasopressors Permissive hypertension (OK if < 220/120) but gradually normalize in 5-7 days Long-term BP goal normotensive  Hyperlipidemia Home meds: Lipitor 80 mg , resumed in hospital LDL 115, goal < 70 Continue statin at discharge  Diabetes type II Uncontrolled Home meds:  Glipizide HgbA1c 7.2, goal < 7.0 CBGs Recent Labs    02/04/22 2300 02/05/22 0319 02/05/22 0731  GLUCAP 112* 107* 123*    SSI  Other Stroke Risk Factors Advanced  Age >/= 62  Obesity, Body mass index is 36.55 kg/m., BMI >/= 30 associated with increased stroke risk, recommend weight loss, diet and exercise as appropriate  Hx stroke/TIA  Other Active Problems S/p PEA cardiac arrest Acute hypoxic respiratory failure in the setting of cardiogenic pulmonary edema Required reintubation due to upper airway edema on 9/20 CCM primary Metabolic encephalopathy  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 Agree with note above.  Patient presented with PEA cardiac arrest and initially was doing well but then subsequently got worse with respiratory distress and bacteremia from Enterococcus and MRI scan showing multiple small scattered bilateral embolic infarcts strong clinical suspicion for endocarditis.  Neurological exam suggests encephalopathy and aphasia mild right-sided weakness.  Check carotid ultrasound and transcranial Doppler studies.  2D echo does not show definite vegetations but TEE will have greater yield for finding vegetations.  Continue ventilatory support as per critical care team and antibiotics as per ID.  Agree with TEE to look for endocarditis to decide duration of antibiotic treatment.  Prognosis is quite guarded at this time.  Long discussion with patient's family members at the bedside about her clinical exam, prognosis and answered questions.  Discussed with Dr. Graylon Good infectious disease and Dr. Tacy Learn critical care medicine.This patient is critically ill and at significant risk of neurological worsening, death and care requires constant monitoring of vital signs, hemodynamics,respiratory and cardiac monitoring, extensive review of multiple databases, frequent neurological assessment, discussion with family, other specialists and medical decision making of high complexity.I have made any additions or clarifications directly to the above note.This critical care time does not reflect procedure time, or teaching time or supervisory time of PA/NP/Med Resident etc but could involve care discussion time.  I spent 35 minutes of neurocritical care time  in the care of  this patient.      Alyssa Contras, MD Medical  Director [facility] Pager: [phone] 02/05/2022 3:24 PM  To contact Stroke Continuity provider, please refer to [email or web address] After hours, contact General Neurology

## 2022-02-05 NOTE — Progress Notes (Signed)
Pt placed on PS/CPAP 8/5 on 40% and is tolerating well. RT will monitor. 

## 2022-02-05 NOTE — Progress Notes (Signed)
NAME:  Alyssa Ingram, MRN:  [id number], DOB:  May 07, 1946, LOS: 6 ADMISSION DATE:  01/29/2022, CONSULTATION DATE:  01/26/2022 REFERRING MD:  Leonette Monarch, CHIEF COMPLAINT:  abdominal and neck pain, loss of consciousness  History of Present Illness:  76 yo woman with hx HFpEF, HOCM, asthma, GERD, HLD, HTN, Obesity, DMII, here after PEA cardiac arrest at home.    History is provided by son who lives with the patient.   She has been feeling poorly, including weakness and malaise for several days.  Chronic leg and foot pain was worse than usual.  This was followed by several days of abdominal discomfort, though no diarrhea or nausea or vomiting.  She was incontinent of stool once which is unusual for her.  9/17 she also developed neck pain.  She usually sleeps laying flat and was able to sleep Saturday night. Since yesterday, they have seen her leaning over the sink and counter bracing herself.    She was feeling worse, becoming weaker and they had decided to take her to the ED, when she started to get worse and more altered.  911 called, and she had completely lost consciousness at that time. When ems arrived, breathing was agonal and she was pulseless. 22 min of cpr, rosc, king airway in the field.    In ED required intubation  She was doing enough to fight the tube that she did receive doses of sedation in ed. On levophed 97mg. Given Zosyn/vanc.    9/19 waking up and following commands, 9/20 extubated, shortly after intubated for upper airway edema. 9/21 less responsive. 9/22 underwent MRI which showed multiple embolic/septic strokes.   Pertinent  Medical History   Past Medical History:  Diagnosis Date   Abnormal MRI, spine 11/2011   ?discitis L5-S1, s/p CT bx 12/12/11   Allergic rhinitis, cause unspecified 01/31/2013   Arthritis    Asthma    [place]    Chronic diastolic heart failure (H[facility])    Diabetic retinopathy (HCC)    GERD (gastroesophageal reflux disease)    H/O cardiovascular  stress test    Nuclear study in 2011 normal perfusion   [place]    Echo 12/19/11: Severe LVH, EF 55-65%, dynamic obstruction, wall motion normal, grade 1 diastolic dysfunction, systolic anterior motion of the mitral valve with mild MS and mild MR, mild LAE, PASP 34   Hyperlipidemia    Hypertension    Kidney stones    Obesity    Type II or unspecified type diabetes mellitus without mention of complication, uncontrolled 01/31/2013    Significant Hospital Events: Including procedures, antibiotic start and stop dates in addition to other pertinent events   9/18 admitted to ICU 9/19 waking up, following commands.  Weaning vasopressors. 9/20 diuresed, passed SBT and extubated, but reintubated for upper airway edema. 9/21 less responsive on no sedation. MRI, EEG ordered.  Interim History / Subjective:  No events overnight.   Objective   Blood pressure 135/89, pulse 87, temperature 98.5 F (36.9 C), temperature source Axillary, resp. rate (!) 28, height 5' (1.524 m), weight 84.9 kg, SpO2 98 %.    Vent Mode: PSV;CPAP FiO2 (%):  [40 %] 40 % Set Rate:  [26 bmp] 26 bmp Vt Set:  [360 mL] 360 mL PEEP:  [5 cmH20] 5 cmH20 Pressure Support:  [8 cmH20] 8 cmH20 Plateau Pressure:  [24 cmH20] 24 cmH20   Intake/Output Summary (Last 24 hours) at 02/05/2022 05852Last data filed at 02/05/2022 0700 Gross  per 24 hour  Intake 1694.92 ml  Output 1450 ml  Net 244.92 ml   Filed Weights   02/03/22 0500 02/04/22 0435 02/05/22 0347  Weight: 83.4 kg 83.3 kg 84.9 kg    Examination: General: Critically ill appearing adult female, on vent  HENT: OG/ETT in place  Lungs: coarse breath sounds, mild use of accessory muscles  Cardiovascular: RRR, HR 84, no mRG Abdomen: obese, soft, active bowel sounds  Extremities: generalized BLE edema, non-pitting  Neuro: withdrawals in all extremities with nailbed pressures, does not follow commands, pupils 3 mm bilaterally and  sluggish  GU: foley in place   Ancillary tests personally reviewed:    Assessment & Plan:   PEA cardiac arrest secondary to acute decompensated HFpEF  Cardiogenic and distributive shock > resolved  Hypertensive cardiomyopathy with demand related ischemia. Plan - Cardiac Monitoring  - TEE as below   Acute hypoxic respiratory failure secondary to cardiogenic pulmonary edema   Respiratory Insuffiencey in setting of encephalopathy  - Extubated 9/20, re-intubated shortly after for upper airway edema  Plan - Continue Vent support  - SBT as tolerates  - Trend ABG/CXR > CXR pending for this AM  - VAP prevention bundle   Encephalopathy  Innumerable supra and infratentorial acute infarcts, evolving right MCA/PCA infarct involving in right temporal lobe, multiple central and peripherally positioned sites of micro-hemorrhages  Plan - ID work up as below  - Neurology following   Enterococcal bacteremia with marked leukocytosis possible from aspiration pneumonia vs UTI, concern for endocarditis with CNS emboli  - Prior to admission (8/18) patient treated for UTI with Cipro 250 mg BID> no cultures obtained, U/A 9/18 with many bacteria, culture on admission negative  Plan - ID following - Trend WBC and Fever Curve  - Continue Ampicillin and Rocephin  - TEE pending   Mild AKI > Improving  Plan - Trend BMP   Hyperglycemia  Plan - Trend Glucose  - Scheduled 5 units q4h - SSI   Best Practice (right click and "Reselect all SmartList Selections" daily)   Diet/type: tube feeds DVT prophylaxis: prophylactic heparin  GI prophylaxis: PPI Lines: PICC Right Brachial 9/18  Foley:  Foley 9/18  Code Status:  full code Last date of multidisciplinary goals of care discussion '[]'$  Reviewed/Discussed MRI findings with Son at bedside. Tearful, understands plan.   CRITICAL CARE Performed by: Omar Person   Total critical care time: 34 minutes  Critical care time was exclusive of  separately billable procedures and treating other patients.  Critical care was necessary to treat or prevent imminent or life-threatening deterioration.  Critical care was time spent personally by me on the following activities: development of treatment plan with patient and/or surrogate as well as nursing, discussions with consultants, evaluation of patient's response to treatment, examination of patient, obtaining history from patient or surrogate, ordering and performing treatments and interventions, ordering and review of laboratory studies, ordering and review of radiographic studies, pulse oximetry, re-evaluation of patient's condition and participation in multidisciplinary rounds.

## 2022-02-06 ENCOUNTER — Inpatient Hospital Stay (HOSPITAL_COMMUNITY): Payer: Medicare Other

## 2022-02-06 ENCOUNTER — Other Ambulatory Visit (HOSPITAL_COMMUNITY): Payer: Medicare Other

## 2022-02-06 DIAGNOSIS — R7881 Bacteremia: Secondary | ICD-10-CM

## 2022-02-06 DIAGNOSIS — I76 Septic arterial embolism: Secondary | ICD-10-CM | POA: Diagnosis not present

## 2022-02-06 DIAGNOSIS — R0989 Other specified symptoms and signs involving the circulatory and respiratory systems: Secondary | ICD-10-CM

## 2022-02-06 DIAGNOSIS — I340 Nonrheumatic mitral (valve) insufficiency: Secondary | ICD-10-CM

## 2022-02-06 DIAGNOSIS — E1065 Type 1 diabetes mellitus with hyperglycemia: Secondary | ICD-10-CM | POA: Diagnosis not present

## 2022-02-06 DIAGNOSIS — I639 Cerebral infarction, unspecified: Secondary | ICD-10-CM | POA: Diagnosis not present

## 2022-02-06 DIAGNOSIS — I330 Acute and subacute infective endocarditis: Secondary | ICD-10-CM | POA: Diagnosis not present

## 2022-02-06 DIAGNOSIS — I469 Cardiac arrest, cause unspecified: Secondary | ICD-10-CM | POA: Diagnosis not present

## 2022-02-06 DIAGNOSIS — B952 Enterococcus as the cause of diseases classified elsewhere: Secondary | ICD-10-CM

## 2022-02-06 DIAGNOSIS — L8990 Pressure ulcer of unspecified site, unspecified stage: Secondary | ICD-10-CM | POA: Insufficient documentation

## 2022-02-06 LAB — CBC WITH DIFFERENTIAL/PLATELET
Abs Immature Granulocytes: 1 10*3/uL — ABNORMAL HIGH (ref 0.00–0.07)
Basophils Absolute: 0.1 10*3/uL (ref 0.0–0.1)
Basophils Relative: 0 %
Eosinophils Absolute: 0.2 10*3/uL (ref 0.0–0.5)
Eosinophils Relative: 1 %
HCT: 25.3 % — ABNORMAL LOW (ref 36.0–46.0)
Hemoglobin: 8 g/dL — ABNORMAL LOW (ref 12.0–15.0)
Immature Granulocytes: 6 %
Lymphocytes Relative: 11 %
Lymphs Abs: 2 10*3/uL (ref 0.7–4.0)
MCH: 26.9 pg (ref 26.0–34.0)
MCHC: 31.6 g/dL (ref 30.0–36.0)
MCV: 85.2 fL (ref 80.0–100.0)
Monocytes Absolute: 1.4 10*3/uL — ABNORMAL HIGH (ref 0.1–1.0)
Monocytes Relative: 8 %
Neutro Abs: 13.2 10*3/uL — ABNORMAL HIGH (ref 1.7–7.7)
Neutrophils Relative %: 74 %
Platelets: 265 10*3/uL (ref 150–400)
RBC: 2.97 MIL/uL — ABNORMAL LOW (ref 3.87–5.11)
RDW: 17.4 % — ABNORMAL HIGH (ref 11.5–15.5)
WBC: 18 10*3/uL — ABNORMAL HIGH (ref 4.0–10.5)
nRBC: 3.2 % — ABNORMAL HIGH (ref 0.0–0.2)

## 2022-02-06 LAB — GLUCOSE, CAPILLARY
Glucose-Capillary: 124 mg/dL — ABNORMAL HIGH (ref 70–99)
Glucose-Capillary: 102 mg/dL — ABNORMAL HIGH (ref 70–99)
Glucose-Capillary: 118 mg/dL — ABNORMAL HIGH (ref 70–99)
Glucose-Capillary: 144 mg/dL — ABNORMAL HIGH (ref 70–99)
Glucose-Capillary: 129 mg/dL — ABNORMAL HIGH (ref 70–99)
Glucose-Capillary: 145 mg/dL — ABNORMAL HIGH (ref 70–99)
Glucose-Capillary: 156 mg/dL — ABNORMAL HIGH (ref 70–99)

## 2022-02-06 LAB — LIPID PANEL
Cholesterol: 119 mg/dL (ref 0–200)
HDL: 34 mg/dL — ABNORMAL LOW (ref >40)
LDL Cholesterol: 71 mg/dL (ref 0–99)
Total CHOL/HDL Ratio: 3.5 RATIO
Triglycerides: 70 mg/dL (ref <150)
VLDL: 14 mg/dL (ref 0–40)

## 2022-02-06 LAB — BASIC METABOLIC PANEL
Anion gap: 12 (ref 5–15)
BUN: 82 mg/dL — ABNORMAL HIGH (ref 8–23)
CO2: 25 mmol/L (ref 22–32)
Calcium: 9.3 mg/dL (ref 8.9–10.3)
Chloride: 110 mmol/L (ref 98–111)
Creatinine, Ser: 1.77 mg/dL — ABNORMAL HIGH (ref 0.44–1.00)
GFR, Estimated: 29 mL/min — ABNORMAL LOW (ref >60)
Glucose, Bld: 131 mg/dL — ABNORMAL HIGH (ref 70–99)
Potassium: 4.8 mmol/L (ref 3.5–5.1)
Sodium: 147 mmol/L — ABNORMAL HIGH (ref 135–145)

## 2022-02-06 LAB — CULTURE, RESPIRATORY W GRAM STAIN
Culture: NO GROWTH
Gram Stain: NONE SEEN

## 2022-02-06 LAB — PHOSPHORUS: Phosphorus: 5.2 mg/dL — ABNORMAL HIGH (ref 2.5–4.6)

## 2022-02-06 LAB — ECHO TEE: Area-P 1/2: 2.38 cm2

## 2022-02-06 LAB — MAGNESIUM: Magnesium: 2.5 mg/dL — ABNORMAL HIGH (ref 1.7–2.4)

## 2022-02-06 MED ORDER — SODIUM CHLORIDE 0.9 % IV SOLN: INTRAVENOUS | Status: DC

## 2022-02-06 MED ORDER — STROKE: EARLY STAGES OF RECOVERY BOOK
Freq: Once | Status: AC
  Filled 2022-02-06: qty 1

## 2022-02-06 MED ORDER — MIDAZOLAM HCL 2 MG/2ML IJ SOLN
2.0000 mg | Freq: Once | INTRAMUSCULAR | Status: AC
  Administered 2022-02-06: 2 mg via INTRAVENOUS

## 2022-02-06 MED ORDER — MIDAZOLAM HCL 2 MG/2ML IJ SOLN
INTRAMUSCULAR | Status: AC
  Filled 2022-02-06: qty 2

## 2022-02-06 NOTE — Progress Notes (Addendum)
STROKE TEAM PROGRESS NOTE   INTERVAL HISTORY Patient's overall presentation unchanged compared to yesterday. TEE confirms large vegetation on anterior mitral leaflet. i. Remains intubated. On ampicillin and ceftriaxone.  Extensive discussion with patient's daughter and son at the bedside about her clinical exam, prognosis and answered questions. Family requested to speak to critical care team and discuss patient's intubation. Also recommended spiritual counseling as they are grieving their mother's current state and are in a state of disbelief.  Vitals:   02/06/22 1540 02/06/22 1600 02/06/22 1700 02/06/22 1800  BP: 112/71 109/84 121/83 132/83  Pulse: (!) 110 90 (!) 103 (!) 102  Resp:  (!) 26 (!) 26 (!) 26  Temp:  98.8 F (37.1 C)    TempSrc:  Bladder    SpO2: 100% 100% 99% 93%  Weight:      Height:       CBC:  Recent Labs  Lab 02/05/22 0341 02/06/22 0323  WBC 17.6* 18.0*  NEUTROABS 15.0* 13.2*  HGB 7.2* 8.0*  HCT 23.0* 25.3*  MCV 83.6 85.2  PLT 239 595   Basic Metabolic Panel:  Recent Labs  Lab 02/05/22 0341 02/06/22 0323  NA 142 147*  K 4.3 4.8  CL 107 110  CO2 24 25  GLUCOSE 120* 131*  BUN 82* 82*  CREATININE 1.90* 1.77*  CALCIUM 8.4* 9.3  MG 2.4 2.5*  PHOS 4.8* 5.2*   Lipid Panel:  Recent Labs  Lab 02/06/22 0323  CHOL 119  TRIG 70  HDL 34*  CHOLHDL 3.5  VLDL 14  LDLCALC 71   HgbA1c:  No results for input(s): "HGBA1C" in the last 168 hours.  Urine Drug Screen:  No results for input(s): "LABOPIA", "COCAINSCRNUR", "LABBENZ", "AMPHETMU", "THCU", "LABBARB" in the last 168 hours.   Alcohol Level No results for input(s): "ETH" in the last 168 hours.  IMAGING past 24 hours ECHO TEE  Result Date: 02/06/2022    TRANSESOPHOGEAL ECHO REPORT   Patient Name:   Alyssa Ingram Date of Exam: 02/06/2022 Medical Rec #:  [id number]        Height:       60.0 in Accession #:    [id number]       Weight:       187.4 lb Date of Birth:  Jul 30, 1945        BSA:          1.816  m Patient Age:    76 years         BP:           132/79 mmHg Patient Gender: F                HR:           104 bpm. Exam Location:  Inpatient Procedure: 3D Echo, Transesophageal Echo, Cardiac Doppler and Color Doppler Indications:     Bacteremia  History:         Patient has prior history of Echocardiogram examinations, most                  recent 01/18/2022. Cardiomyopathy and Hypertrophic                  Cardiomyopathy; Arrythmias:Cardiac Arrest.  Sonographer:     Roseanna Rainbow RDCS Referring Phys:  [id number] Kipp Brood Diagnosing Phys: Sanda Klein MD PROCEDURE: After discussion of the risks and benefits of a TEE, an informed consent was obtained from a family member. The transesophogeal probe was passed without difficulty through  the esophogus of the patient. Imaged were obtained with the patient in a supine position. Sedation performed by performing physician. Patients was under conscious sedation during this procedure. Anesthetic administered: 50mg of Fentanyl, 4.'0mg'$  of Versed. The patient's vital signs; including heart rate, blood pressure, and oxygen saturation; remained stable throughout the procedure. The patient developed no complications during the procedure. IMPRESSIONS  1. Left ventricular ejection fraction, by estimation, is 70 to 75%. The left ventricle has hyperdynamic function. The left ventricle has no regional wall motion abnormalities. There is severe concentric left ventricular hypertrophy. Left ventricular diastolic function could not be evaluated.  2. Right ventricular systolic function is normal. The right ventricular size is normal. There is moderately elevated pulmonary artery systolic pressure. The estimated right ventricular systolic pressure is 595.2mmHg.  3. Left atrial size was severely dilated. No left atrial/left atrial appendage thrombus was detected.  4. Right atrial size was mildly dilated.  5. There is at least one highly mobile echodensity attached to the middle (P2) scallop  of the posterior leaflet, measuring up to 19 mm in length, consistent with a vegetation or large ruptured chorda(e). In addition, there may be at least one other mobile echodensity attached to the anterior mitral leaflet. There is systolic anterior motion of the mitral leaflet, but there is no significant LV outflow obstruction. The mitral valve is degenerative. Moderate to severe mitral valve regurgitation. Mild  mitral stenosis. The mean mitral valve gradient is 6.1 mmHg with average heart rate of 104 bpm. Moderate to severe mitral annular calcification.  6. There is a tiny mobile echodensity that probably represents a Lambl's excrescence. Calcificatis prominent on the noncoronary cusp. The aortic valve is normal in structure. There is mild calcification of the aortic valve. There is mild thickening of the aortic valve. Aortic valve regurgitation is not visualized. Aortic valve sclerosis/calcification is present, without any evidence of aortic stenosis.  7. There is Moderate (Grade III) protruding plaque involving the aortic arch. Comparison(s): Compared to the 01/28/2022 study, major findings are unchanged. Compared to the 06/19/2021 study, the mitral regurgitation is much more severe and the jet is now eccentric, directed anterolaterally. The jet direction is consistent with flail motion or perforation of the middle (P2) scallop of the posterior mitral leaflet. The echo findings do not suggest that the MR is due to systolic anterior motion on the mitral valve. This could represent primary chordal rupture or endocarditis (the  clinical scenario and the impression that there is more than one echodensity attached to the mitral valve supports a diagnosis of endocarditis). FINDINGS  Left Ventricle: Left ventricular ejection fraction, by estimation, is 70 to 75%. The left ventricle has hyperdynamic function. The left ventricle has no regional wall motion abnormalities. The left ventricular internal cavity size was  normal in size. There is severe concentric left ventricular hypertrophy. Left ventricular diastolic function could not be evaluated due to atrial fibrillation. Left ventricular diastolic function could not be evaluated. Right Ventricle: The right ventricular size is normal. No increase in right ventricular wall thickness. Right ventricular systolic function is normal. There is moderately elevated pulmonary artery systolic pressure. The tricuspid regurgitant velocity is 3.34 m/s, and with an assumed right atrial pressure of 8 mmHg, the estimated right ventricular systolic pressure is 584.1mmHg. Left Atrium: Left atrial size was severely dilated. No left atrial/left atrial appendage thrombus was detected. Right Atrium: Right atrial size was mildly dilated. Prominent Eustachian valve. Pericardium: There is no evidence of pericardial effusion. Mitral Valve: There is  at least one highly mobile echodensity attached to the middle (P2) scallop of the posterior leaflet, measuring up to 19 mm in length, consistent with a vegetation or large ruptured chorda(e). In addition, there may be at least one other mobile echodensity attached to the anterior mitral leaflet. There is systolic anterior motion of the mitral leaflet, but there is no significant LV outflow obstruction. The mitral valve is degenerative in appearance. Moderate to severe mitral annular calcification. Moderate to severe mitral valve regurgitation, with eccentric posteriorly directed jet. Mild mitral valve stenosis. MV peak gradient, 13.1 mmHg. The mean mitral valve gradient is 6.1 mmHg with average heart rate of 104 bpm. Tricuspid Valve: The tricuspid valve is normal in structure. Tricuspid valve regurgitation is trivial. Aortic Valve: There is a tiny mobile echodensity that probably represents a Lambl's excrescence. Calcificatis prominent on the noncoronary cusp. The aortic valve is normal in structure. There is mild calcification of the aortic valve. There is  mild thickening of the aortic valve. Aortic valve regurgitation is not visualized. Aortic valve sclerosis/calcification is present, without any evidence of aortic stenosis. Pulmonic Valve: The pulmonic valve was grossly normal. Pulmonic valve regurgitation is not visualized. Aorta: The aortic root, ascending aorta, aortic arch and descending aorta are all structurally normal, with no evidence of dilitation or obstruction. There is moderate (Grade III) protruding plaque involving the aortic arch. Venous: A systolic blunting flow pattern is recorded from the left upper pulmonary vein and the right upper pulmonary vein. IAS/Shunts: No atrial level shunt detected by color flow Doppler.  MITRAL VALVE               TRICUSPID VALVE MV Area (PHT): 2.38 cm    TR Peak grad:   44.6 mmHg MV Peak grad:  13.1 mmHg   TR Vmax:        334.00 cm/s MV Mean grad:  6.1 mmHg MV Vmax:       1.81 m/s MV Vmean:      101.0 cm/s MV Decel Time: 319 msec Mihai Croitoru MD Electronically signed by Sanda Klein MD Signature Date/Time: 02/06/2022/4:54:14 PM    Final    VAS [place] TRANSCRANIAL DOPPLER  Result Date: 02/06/2022  Transcranial Doppler Patient Name:  Alyssa Ingram  Date of Exam:   02/06/2022 Medical Rec #: [id number]         Accession #:    [id number] Date of Birth: June 15, 1945         Patient Gender: F Patient Age:   19 years Exam Location:  [facility] Procedure:      VAS [place] TRANSCRANIAL DOPPLER Referring Phys: Janine Ores --------------------------------------------------------------------------------  Indications: Stroke. History: Hyperlipidemia, hyperteison, obesity, Typ II diabetes. Limitations: Age. poor windows Limitations for diagnostic windows: Unable to insonate right transtemporal window. Performing Technologist: Oda Cogan RDMS, RVT  Examination Guidelines: A complete evaluation includes B-mode imaging, spectral Doppler, color Doppler, and power Doppler as needed of all accessible portions of each vessel.  Bilateral testing is considered an integral part of a complete examination. Limited examinations for reoccurring indications may be performed as noted.  +----------+-------------+----------+-----------+------------------+ RIGHT TCD Right VM (cm)Depth (cm)Pulsatility     Comment       +----------+-------------+----------+-----------+------------------+ MCA           24.00                         contralateral side +----------+-------------+----------+-----------+------------------+ ACA  not insonated    +----------+-------------+----------+-----------+------------------+ Term ICA                                      not insonated    +----------+-------------+----------+-----------+------------------+ PCA           17.00                                            +----------+-------------+----------+-----------+------------------+ Opthalmic     18.00                                            +----------+-------------+----------+-----------+------------------+ ICA siphon    40.00                                            +----------+-------------+----------+-----------+------------------+ Vertebral    -19.00                                            +----------+-------------+----------+-----------+------------------+  +----------+------------+----------+-----------+-------+ LEFT TCD  Left VM (cm)Depth (cm)PulsatilityComment +----------+------------+----------+-----------+-------+ MCA          22.00                                 +----------+------------+----------+-----------+-------+ ACA          -20.00                                +----------+------------+----------+-----------+-------+ PCA          23.00                                 +----------+------------+----------+-----------+-------+ Opthalmic    18.00                                  +----------+------------+----------+-----------+-------+ ICA siphon   61.00                                 +----------+------------+----------+-----------+-------+ Vertebral    -18.00                                +----------+------------+----------+-----------+-------+  +------------+-----+-------------+             VM cm   Comment    +------------+-----+-------------+ Prox Basilar     not insonated +------------+-----+-------------+ Summary:  Poor acoustic windows throughout limit evaluation. Not all blood vessels could be insonated.antegrade flow noted in both middle cerebral, left anterior cerebral, bilateral posterior cerebral, opthalmics, carotid siphons and right vertebral arteries. *See table(s) above for TCD measurements and observations.  Diagnosing physician: Antony Contras MD Electronically signed by Antony Contras MD on 02/06/2022 at  3:36:34 PM.    Final    VAS US CAROTID  Result Date: 02/06/2022 Carotid Arterial Duplex Study Patient Name:  Alyssa Ingram  Date of Exam:   02/06/2022 Medical Rec #: [id number]         Accession #:    [id number] Date of Birth: [birth year]         Patient Gender: F Patient Age:   74 years Exam Location:  [facility] Procedure:      VAS US CAROTID Referring Phys: Janine Ores --------------------------------------------------------------------------------  Indications:      CVA. Risk Factors:     Hypertension, hyperlipidemia, Diabetes, coronary artery                   disease. Comparison Study: Carotid 2014 Performing Technologist: Oda Cogan RDMS, RVT  Examination Guidelines: A complete evaluation includes B-mode imaging, spectral Doppler, color Doppler, and power Doppler as needed of all accessible portions of each vessel. Bilateral testing is considered an integral part of a complete examination. Limited examinations for reoccurring indications may be performed as noted.  Right Carotid Findings:  +----------+--------+--------+--------+------------------+------------------+           PSV cm/sEDV cm/sStenosisPlaque DescriptionComments           +----------+--------+--------+--------+------------------+------------------+ CCA Prox  95      15                                                   +----------+--------+--------+--------+------------------+------------------+ CCA Distal49      10                                intimal thickening +----------+--------+--------+--------+------------------+------------------+ ICA Prox  31      11      1-39%                                        +----------+--------+--------+--------+------------------+------------------+ ICA Distal78      24                                intimal thickening +----------+--------+--------+--------+------------------+------------------+ ECA       55      9                                                    +----------+--------+--------+--------+------------------+------------------+ +----------+--------+-------+----------------+-------------------+           PSV cm/sEDV cmsDescribe        Arm Pressure (mmHG) +----------+--------+-------+----------------+-------------------+ [id number]            Multiphasic, WNL                    +----------+--------+-------+----------------+-------------------+ +---------+--------+--+--------+--+---------+ VertebralPSV cm/s41EDV cm/s17Antegrade +---------+--------+--+--------+--+---------+  Left Carotid Findings: +----------+--------+--------+--------+------------------+------------------+           PSV cm/sEDV cm/sStenosisPlaque DescriptionComments           +----------+--------+--------+--------+------------------+------------------+ CCA Prox  142     21                                                   +----------+--------+--------+--------+------------------+------------------+  CCA Distal65      13                                                    +----------+--------+--------+--------+------------------+------------------+ ICA Prox  38      17      1-39%                     intimal thickening +----------+--------+--------+--------+------------------+------------------+ ICA Mid   43      15                                                   +----------+--------+--------+--------+------------------+------------------+ ICA Distal60      24                                                   +----------+--------+--------+--------+------------------+------------------+ +----------+--------+--------+----------------+-------------------+           PSV cm/sEDV cm/sDescribe        Arm Pressure (mmHG) +----------+--------+--------+----------------+-------------------+ [id number]             Multiphasic, WNL                    +----------+--------+--------+----------------+-------------------+ +---------+--------+--+--------+--+---------+ VertebralPSV cm/s58EDV cm/s21Antegrade +---------+--------+--+--------+--+---------+   Summary: Right Carotid: Velocities in the right ICA are consistent with a 1-39% stenosis. Left Carotid: Velocities in the left ICA are consistent with a 1-39% stenosis. Vertebrals: Bilateral vertebral arteries demonstrate antegrade flow. *See table(s) above for measurements and observations.  Electronically signed by Antony Contras MD on 02/06/2022 at 3:34:38 PM.    Final    ECHO TEE  Result Date: 02/06/2022 Sanda Klein, MD     02/06/2022 12:36 PM INDICATIONS: endocarditis PROCEDURE: Informed consent was obtained prior to the procedure. The risks, benefits and alternatives for the procedure were discussed and the patient comprehended these risks.  Risks include, but are not limited to, cough, sore throat, vomiting, nausea, somnolence, esophageal and stomach trauma or perforation, bleeding, low blood pressure, aspiration, pneumonia, infection, trauma to the teeth and death.   After a procedural time-out,  the patient was administered a total of Versed 4 mg and Fentanyl 100 mcg IV (patient on mechanical ventilation already).  The patient's heart rate, blood pressure, and oxygen saturationweare monitored continuously during the procedure. The period of conscious sedation was 20 minutes, of which I was present face-to-face 100% of this time. The transesophageal probe was inserted in the esophagus and stomach without difficulty and multiple views were obtained.  The patient was kept under observation until the patient left the procedure room.  The patient left the procedure room in stable condition. Agitated microbubble saline contrast was not administered. COMPLICATIONS:  There were no immediate complications. FINDINGS: There is a large, highly mobile vegetation on the anterior mitral leaflet. 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 There is significant mitral insufficiency, probably moderate to severe, eccentric, anteriorly directed. The left ventricle is severely hypertrophic and hyperdynamic. There is aortic valve sclerosis without stenosis. There is a small mobile echodensity on  the aortic valve that likely represents a Lambl's excresence, rather than a vegetation. Prominent aortic arch atherosclerosis. RECOMMENDATIONS:   Treatment for endocarditis. Follow MR with TTE. Time Spent Directly with the Patient: 45 minutes Mihai Croitoru 02/06/2022, 12:31 PM    --PHYSICAL EXAM-- General: Critically ill-appearing obese female, orally intubated HEENT: /AT, eyes anicteric.  ETT and OGT in place Chest: Coarse breath sounds, no wheezes or rhonchi Heart: Regular rate and rhythm, no murmurs or gallops Abdomen: Soft, nontender, nondistended, bowel sounds present Skin: No rash  Neuro Mental Status Intubated and off sedation. Eyes will partially open to stimuli. Does not attempt to communicate. Not following any commands.  Opens eyes  to sternal rub but appears aphasic and does not follow commands.  Cranial Nerves II: Inconsistent blink to threat.  Left greater than right sluggishly reactive pupils with asymmetry in the context of left sided cataract.   III,IV, VI: Weak doll's eye reflex. Eyes are conjugate and near the midline.  V,VII: Deferred VIII: No response to voice IX,X: Intubated with weak cough and gag XI: Head is midline XII: Unable to assess  Motor/Semsory: Flaccid tone x 4 is symmetric.  No spontaneous movements including no posturing.  Not following any commands.  Some fluctuate posturing to sternal rub. Movement of BLE and left upper extremity to  sternal rub.  ASSESSMENT/PLAN Alyssa Ingram is a 76 y.o. female with history of PMHx of cervical cancer, DM2, diabetic retinopathy, HLD, HTN,. 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 On 9/22 she underwent MRI which showed multiple embolic/septic strokes. Complicating factors this admission have included enterococcal faecalis bacteremia and acute on chronic HFpEF.  Stroke Innumerable supra and infratentorial acute infarcts, evolving right MCA/PCA infarct involving right temporal lobe, multiple central and peripherally positioned sites of micro-hemorrhages  Etiology:  Likely cardioembolic strong suspicion for endocarditis in the setting of bacteremia and mitral valve vegetation CT head Stable subacute right temporal and parietal lobe infarcts, numerous small cortical and subcortical infarcts  MRI  There are innumerable supra and infratentorial acute infarcts. Given distribution and patient's history of cardiac arrest, these are likely secondary to a central embolic etiology. Redemonstrated is a an evolving right MCA/PCA territory infarct involving the right temporal lobe. there are multiple central and peripherally positioned sites of microhemorrhages in the bilateral cerebral and cerebellar hemispheres, as well as the  pons. Many of the microhemorrhages are not in sites of infarcts EEG: (9/22): Continuous slow, generalized. This study is suggestive of moderate to severe diffuse encephalopathy, nonspecific etiology. No seizures or epileptiform discharges were seen throughout the recording. TEE: Large highly mobile vegetation on the anterior mitral leaflet with a severely hypertrophic and hyperdynamic left ventricle. . 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 Significant mitral insufficiency. 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 Prominent aortic arch atherosclerosis. Carotid Doppler bilateral 1-39% stenosis 2D Echo EF 65-70% with grade III diastolic dysfunction, LA severely dilated, RA midly dilated TEE large anterior mitral valve leaflet vegetation.  Mitral annulus calcification. LDL 115 HgbA1c 7.2 VTE prophylaxis - Heparin  aspirin 81 mg daily prior to admission, now on No antithrombotic. given concern for endocarditis Therapy recommendations:  pending Disposition:  pending  Hx hypertension with intermittent hypotension requiring vasopressors Home meds:  Coreg '25mg'$  Stable- off vasopressors Permissive hypertension (OK if < 220/120) but gradually normalize in 5-7 days Long-term BP goal normotensive  Hyperlipidemia Home meds: Lipitor 80 mg , resumed in hospital LDL 115, goal < 70 Continue statin at discharge  Diabetes type II Uncontrolled Home meds:  Glipizide HgbA1c 7.2, goal < 7.0 CBGs Recent Labs    02/06/22 0857 02/06/22 1216 02/06/22 1512  GLUCAP 118* 144* 129*    SSI  Other Stroke Risk Factors Advanced Age >/= 65  Obesity, Body mass index is 36.6 kg/m., BMI >/= 30 associated with increased stroke risk, recommend weight loss, diet and exercise as appropriate  Hx stroke/TIA  Other Active Problems S/p PEA cardiac arrest Acute hypoxic respiratory failure in the  setting of cardiogenic pulmonary edema Required reintubation due to upper airway edema on 9/20 CCM primary Metabolic encephalopathy  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 Agree with note above.  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  Prognosis remains quite poor.  Patient is unlikely to survive without prolonged ventilatory support, tracheostomy, PEG tube and 24-hour nursing care.  Patient's family appears to be quite distraught and would like to continue to support those for now.  Consultation of palliative care team to discuss goals of care with family would be appropriate.  Continue antibiotics as per ID Discussed with Dr. Tamala Julian critical care medicine.,This patient is critically ill and at significant risk of neurological worsening, death and care requires constant monitoring of vital signs, hemodynamics,respiratory and cardiac monitoring, extensive review of multiple databases, frequent neurological assessment, discussion with family, other specialists and medical decision making of high complexity.I have made any additions or clarifications directly to the above note.This critical care time does not reflect procedure time, or teaching time or supervisory time of PA/NP/Med Resident etc but could involve care discussion time.  I spent 30 minutes of neurocritical care time  in the care of  this patient.      Antony Contras, MD Medical Director [facility] Pager: [phone] 02/06/2022  7:28 PM   To contact Stroke Continuity provider, please refer to [email or web address] After hours, contact General Neurology

## 2022-02-06 NOTE — Progress Notes (Signed)
[facility] for Infectious Disease  Date of Admission:  01/26/2022           Reason for visit: Follow up on enterococcal bacteremia  Current antibiotics: Ceftriaxone Ampicillin   ASSESSMENT:    76 y.o. 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  Found to have CNS emboli with concern for endocarditis.  Currently on ampicillin and ceftriaxone.  TEE planned for this afternoon.  Acute hypoxic respiratory failure Remains intubated on the ventilator.  Status post PEA cardiac arrest  Acute kidney injury Creatinine improving.  Diabetes Hemoglobin A1c 7.2.  RECOMMENDATIONS:    Continue ceftriaxone 2 g every 12 hours Continue ampicillin 2 g every 8 hours Await TEE today Will follow   Principal Problem:   Enterococcal bacteremia Active Problems:   Cardiac arrest ([place])   Pressure injury of skin   Septic embolism (HCC)   Acute bacterial endocarditis    MEDICATIONS:    Scheduled Meds:  Chlorhexidine Gluconate Cloth  6 each Topical Daily   heparin  5,000 Units Subcutaneous Q8H   insulin aspart  0-15 Units Subcutaneous Q4H   insulin aspart  5 Units Subcutaneous Q4H   ipratropium-albuterol  3 mL Nebulization Q6H   lidocaine  2 patch Transdermal Q24H   multivitamin  15 mL Per Tube Daily   mouth rinse  15 mL Mouth Rinse Q2H   pantoprazole  40 mg Per Tube Daily   sodium chloride flush  10-40 mL Intracatheter Q12H   Continuous Infusions:  sodium chloride     ampicillin (OMNIPEN) IV 2 g (02/06/22 1034)   cefTRIAXone (ROCEPHIN)  IV 2 g (02/06/22 1040)   feeding supplement (VITAL AF 1.2 CAL) Stopped (02/06/22 0000)   PRN Meds:.acetaminophen, albuterol, docusate, fentaNYL (SUBLIMAZE) injection, midazolam, ondansetron (ZOFRAN) IV, mouth rinse, oxyCODONE, polyethylene glycol, polyvinyl  alcohol, sodium chloride flush  SUBJECTIVE:    Patient is getting a vascular study done at the bedside.  She remains intubated and nonresponsive.  Review of Systems  Unable to perform ROS: Intubated      OBJECTIVE:   Blood pressure 132/79, pulse (!) 101, temperature 99.7 F (37.6 C), temperature source Bladder, resp. rate (!) 23, height 5' (1.524 m), weight 85 kg, SpO2 99 %. Body mass index is 36.6 kg/m.  Physical Exam Constitutional:      Comments: Critically ill-appearing woman, lying in bed, intubated.  HENT:     Head: Normocephalic and atraumatic.  Abdominal:     General: There is no distension.     Palpations: Abdomen is soft.  Musculoskeletal:     Cervical back: Normal range of motion and neck supple.     Comments: Woody, brawny lower extremity edema with feet that are cool to the touch Right-sided PICC line in place  Skin:    General: Skin is warm and dry.      Lab Results: Lab Results  Component Value Date   WBC 18.0 (H) 02/06/2022   HGB 8.0 (L) 02/06/2022   HCT 25.3 (L) 02/06/2022   MCV 85.2 02/06/2022   PLT 265 02/06/2022    Lab Results  Component Value Date   NA 147 (H) 02/06/2022   K 4.8 02/06/2022   CO2 25 02/06/2022   GLUCOSE 131 (H) 02/06/2022   BUN 82 (H) 02/06/2022   CREATININE 1.77 (H) 02/06/2022   CALCIUM 9.3 02/06/2022   GFRNONAA 29 (  L) 02/06/2022   GFRAA >60 12/09/2015    Lab Results  Component Value Date   ALT 41 02/02/2022   AST 24 02/02/2022   ALKPHOS 75 02/02/2022   BILITOT 0.8 02/02/2022       Component Value Date/Time   CRP <1.0 01/18/2021 1715       Component Value Date/Time   ESRSEDRATE 27 01/18/2021 1715     I have reviewed the micro and lab results in Epic.  Imaging: DG CHEST PORT 1 VIEW  Result Date: 02/05/2022 CLINICAL DATA:  Respiratory failure EXAM: PORTABLE CHEST 1 VIEW COMPARISON:  02/02/2022 FINDINGS: Interval placement of large bore feeding tube which is coiled in the gastric body. Endotracheal  tube terminates at the level of the clavicular heads. Stable cardiomegaly. Aortic atherosclerosis. Worsening patchy bilateral airspace opacities, particularly within the right upper lobe. No pneumothorax. IMPRESSION: 1. Interval placement of feeding tube, which is coiled in the gastric body. 2. Worsening bilateral airspace opacities, particularly within the right upper lobe. Electronically Signed   By: Davina Poke D.O.   On: 02/05/2022 11:08   CT HEAD WO CONTRAST (5MM)  Result Date: 02/04/2022 CLINICAL DATA:  Anisocoria EXAM: CT HEAD WITHOUT CONTRAST TECHNIQUE: Contiguous axial images were obtained from the base of the skull through the vertex without intravenous contrast. RADIATION DOSE REDUCTION: This exam was performed according to the departmental dose-optimization program which includes automated exposure control, adjustment of the mA and/or kV according to patient size and/or use of iterative reconstruction technique. COMPARISON:  CT head without contrast 02/02/2022 FINDINGS: Brain: Similar appearance of a subacute right temporal and parietal scratched at similar appearance of the right temporal and occipital lobe infarct noted. Involvement of the right parietal lobe is stable. Numerous other smaller cortical and subcortical infarcts are less well-defined by CT. No new large territorial infarct is present. Extensive white matter hypoattenuation is again seen. Ventricles are stable. No significant extra-axial fluid collection is present. Vascular: Atherosclerotic calcifications are present within the cavernous internal carotid arteries. No hyperdense vessel is present. Skull: Insert normal skull No significant extracranial soft tissue lesion is present. Sinuses/Orbits: The paranasal sinuses and mastoid air cells are clear. Right lens replacement is present. Dysconjugate gaze noted. Globes and orbits are otherwise within normal limits. IMPRESSION: 1. Stable appearance of subacute right temporal and  parietal lobe infarct. 2. Numerous other smaller cortical and subcortical infarcts are less well-defined by CT. 3. No new large territorial infarct or hemorrhage. 4. Stable advanced white matter disease. This likely reflects the sequela of chronic microvascular ischemia. Electronically Signed   By: San Morelle M.D.   On: 02/04/2022 17:26     Imaging independently reviewed in Epic.    [facility] [phone] pager 02/06/2022, 11:36 AM

## 2022-02-06 NOTE — Procedures (Signed)
INDICATIONS: endocarditis  PROCEDURE:   Informed consent was obtained prior to the procedure. The risks, benefits and alternatives for the procedure were discussed and the patient comprehended these risks.  Risks include, but are not limited to, cough, sore throat, vomiting, nausea, somnolence, esophageal and stomach trauma or perforation, bleeding, low blood pressure, aspiration, pneumonia, infection, trauma to the teeth and death.    After a procedural time-out,  the patient was administered a total of Versed 4 mg and Fentanyl 100 mcg IV (patient on mechanical ventilation already).  The patient's heart rate, blood pressure, and oxygen saturationweare monitored continuously during the procedure. The period of conscious sedation was 20 minutes, of which I was present face-to-face 100% of this time.  The transesophageal probe was inserted in the esophagus and stomach without difficulty and multiple views were obtained.  The patient was kept under observation until the patient left the procedure room.  The patient left the procedure room in stable condition.   Agitated microbubble saline contrast was not administered.  COMPLICATIONS:    There were no immediate complications.  FINDINGS:  There is a large, highly mobile vegetation on the anterior mitral leaflet. 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 There is significant mitral insufficiency, probably moderate to severe, eccentric, anteriorly directed. The left ventricle is severely hypertrophic and hyperdynamic. There is aortic valve sclerosis without stenosis. 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 Prominent aortic arch atherosclerosis.  RECOMMENDATIONS:     Treatment for endocarditis. Follow MR with TTE.  Time Spent Directly with the Patient:  45 minutes   Rafeal Skibicki 02/06/2022, 12:31  PM

## 2022-02-06 NOTE — Progress Notes (Signed)
    [facility] has been requested to perform a transesophageal echocardiogram on Alyssa Ingram for bacteremia and stroke (to rule out endocarditis).  After careful review of history and examination, the risks and benefits of transesophageal echocardiogram have been explained including risks of esophageal damage, perforation (1:10,000 risk), bleeding, pharyngeal hematoma as well as other potential complications associated with conscious sedation including aspiration, arrhythmia, respiratory failure and death. Alternatives to treatment were discussed, questions were answered.   Patient is currently intubated and unable to consent. Called and spoke with son [name] and daughter [name]. Both agreed to proceed with procedure. My colleague Lily Kocher, PA-C, confirmed consent over the phone. We both signed consent form and will place in paper chart.  Procedure is scheduled for this afternoon at 2pm and will be done at bedside by Dr. Sallyanne Kuster.  Darreld Mclean, PA-C 02/06/2022 11:01 AM

## 2022-02-06 NOTE — Progress Notes (Signed)
  Echocardiogram Echocardiogram Transesophageal has been performed.  Alyssa Ingram 02/06/2022, 12:15 PM

## 2022-02-06 NOTE — Progress Notes (Signed)
NAME:  Alyssa Ingram, MRN:  [id number], DOB:  05-04-1946, LOS: 7 ADMISSION DATE:  01/13/2022, CONSULTATION DATE:  01/19/2022 REFERRING MD:  Leonette Monarch - EDP CHIEF COMPLAINT:  PEA arrest  History of Present Illness:  76 year old woman who [facility] ED 9/18 post-PEA arrest. PMHx significant for HTN, HLD, HFpEF, HOCM, asthma, T2DM, GERD, obesity.   History is provided by son who lives with the patient.   She has been feeling poorly, including weakness and malaise for several days.  Chronic leg and foot pain was worse than usual.  This was followed by several days of abdominal discomfort, though no diarrhea or nausea or vomiting.  She was incontinent of stool once which is unusual for her.  9/17 she also developed neck pain.  She usually sleeps laying flat and was able to sleep Saturday night. Since yesterday, they have seen her leaning over the sink and counter bracing herself.    She was feeling worse, becoming weaker and they had decided to take her to the ED, when she started to get worse and more altered.  911 called, and she had completely lost consciousness at that time. When EMS arrived, breathing was agonal and she was pulseless. CPR x 22 min with ROSC, King airway in the field.    Intubated on arrival to ED. Neurologically, she was doing enough to fight the tube that she did receive doses of sedation in ED. Required pressor initiation (Levophed) and broad-spectrum antibiotic coverage with Vanc/Zosyn.  On 9/19, patient was noted to be waking up and following commands. She was extubated 9/20; shortly after extubation she required reintubation for upper airway edema. On 9/21, noted to be less responsive. Underwent MRI Brain 9/22 which showed multiple embolic/septic strokes, raising concern for endocarditis.  Pertinent Medical History:   Past Medical History:  Diagnosis Date   Abnormal MRI, spine 11/2011   ?discitis L5-S1, s/p CT bx 12/12/11   Allergic rhinitis, cause unspecified  01/31/2013   Arthritis    Asthma    Cervical cancer ([place])    Chronic diastolic heart failure (HCC)    Diabetic retinopathy (HCC)    GERD (gastroesophageal reflux disease)    H/O cardiovascular stress test    Nuclear study in 2011 normal perfusion   HOCM (hypertrophic obstructive cardiomyopathy) ([place])    Echo 12/19/11: Severe LVH, EF 55-65%, dynamic obstruction, wall motion normal, grade 1 diastolic dysfunction, systolic anterior motion of the mitral valve with mild MS and mild MR, mild LAE, PASP 34   Hyperlipidemia    Hypertension    Kidney stones    Obesity    Type II or unspecified type diabetes mellitus without mention of complication, uncontrolled 01/31/2013   Significant Hospital Events: Including procedures, antibiotic start and stop dates in addition to other pertinent events   9/18 Admitted to ICU 9/19 waking up, following commands.  Weaning vasopressors. 9/20 Diuresed, passed SBT and extubated, but reintubated for upper airway edema. 9/21 less responsive on no sedation. MRI, EEG ordered. 9/22  MRI which showed multiple embolic/septic strokes. 9/25 Marginally responsive. TEE completed with +highly mobile vegetation c/w endocarditis.  Interim History / Subjective:  No significant overnight events. Less neurologically responsive today, spontaneous eye opening/cough/gag, withdrawing with significant painful stimuli of BUE/BLE, pupil asymmetry noted TEE today with cardiology demonstrating +vegetation, c/f endocarditis Continue antibiotic therapy Trend Na, 147 (142) Slight improvement in renal function  Objective:  Blood pressure (!) 123/93, pulse (!) 187, temperature 99.7 F (37.6 C), temperature source Bladder, resp. rate Marland Kitchen)  23, height 5' (1.524 m), weight 85 kg, SpO2 97 %.    Vent Mode: PRVC FiO2 (%):  [40 %-42 %] 40 % Set Rate:  [26 bmp] 26 bmp Vt Set:  [360 mL] 360 mL PEEP:  [5 cmH20] 5 cmH20 Pressure Support:  [8 cmH20-10 cmH20] 10 cmH20 Plateau Pressure:  [18  cmH20-19 cmH20] 19 cmH20   Intake/Output Summary (Last 24 hours) at 02/06/2022 1431 Last data filed at 02/06/2022 1200 Gross per 24 hour  Intake 1169.87 ml  Output 1700 ml  Net -530.13 ml   Filed Weights   02/04/22 0435 02/05/22 0347 02/06/22 0435  Weight: 83.3 kg 84.9 kg 85 kg   Physical Examination: General: Chronically ill-appearing elderly woman in NAD. Eyes open, not responsive. HEENT: [place]/AT, anicteric sclera, L pupil 75m and sluggishly reactive to light, R pupil 315mand sluggishly reactive, moist mucous membranes. ETT/OGT in place. Neuro:  Awake, not responsive. Slight leftward gaze preference. Does not track examiner.  Withdraws to pain in all 4 extremities, BLE more readily than BUE. Not following commands.+Corneal, +Cough, and +Gag  CV: Mildly tachycardic, +III/VI systolic murmur heard best at LUSB. PULM: Breathing even and mildly labored on vent (PSV 10/5, FiO2 40%). Lung fields coarse throughout. GI: Soft, nontender, nondistended. Normoactive bowel sounds. Extremities: Bilateral chronic LE edema noted with skin changes c/w chronic venous insufficiency. Skin: Warm/dry, skin change of BLE c/w chronic venous insufficiency with woody texture.  Assessment & Plan:   PEA cardiac arrest secondary to acute decompensated HFpEF  Cardiogenic and distributive shock > resolved  Hypertensive cardiomyopathy with demand related ischemia. - TEE today, 9/25 with +highly mobile vegetation noted  - Long-term antibiotic therapy as below\ - Cardiac monitoring  Acute hypoxic respiratory failure secondary to cardiogenic pulmonary edema   Respiratory Insuffiencey in setting of encephalopathy  Extubated 9/20, re-intubated shortly after for upper airway edema  - Continue full vent support (4-8cc/kg IBW) - Wean as able, tolerating PSV 9/25AM - Wean FiO2 for O2 sat > 90% - Daily WUA/SBT as mental status tolerates - VAP bundle - Pulmonary hygiene - PAD protocol for sedation: Fentanyl and Versed  for goal RASS 0 to -1 - ABG/CXR as clinically indicated  Encephalopathy  Innumerable supra and infratentorial acute infarcts, evolving right MCA/PCA infarct involving in right temporal lobe, multiple central and peripherally positioned sites of micro-hemorrhages  Thought to be septic emboli in the setting of now known endocarditis. - Stroke team following, appreciate recs - Further imaging per Neurology - TCDs per protocol - EEG per Neuro - Neuroprotective measures: HOB > 30 degrees, normoglycemia, normothermia, electrolytes WNL  Enterococcal bacteremia with marked leukocytosis possible from aspiration pneumonia vs UTI Endocarditis with CNS emboli  Prior to admission (8/18) patient treated for UTI with Cipro 250 mg BID> no cultures obtained, U/A 9/18 with many bacteria, culture on admission negative. TEE 9/25 with +highly mobile vegetation c/w endocarditis. - ID following, appreciate recs - Trend WBC, fever curve - Will need long-term antibiotic therapy for above endocarditis - Continue ampicillin, rocephin  Mild AKI > Improving  Plan - Trend BMP - Replete electrolytes as indicated - Monitor I&Os - F/u urine studies - Avoid nephrotoxic agents as able - Ensure adequate renal perfusion  Hyperglycemia  Plan - SSI - TF coverage 5U Q4H - CBGs Q4H - Goal CBG 140-180  Best Practice: (right click and "Reselect all SmartList Selections" daily)   Diet/type: tube feeds DVT prophylaxis: prophylactic heparin  GI prophylaxis: PPI Lines: PICC Right Brachial 9/18  Foley:  Foley 9/18  Code Status:  full code Last date of multidisciplinary goals of care discussion '[]'$  Reviewed/Discussed MRI findings with Son at bedside. Tearful, understands plan.   Critical care time:   The patient is critically ill with multiple organ system failure and requires high complexity decision making for assessment and support, frequent evaluation and titration of therapies, advanced monitoring, review of  radiographic studies and interpretation of complex data.   Critical Care Time devoted to patient care services, exclusive of separately billable procedures, described in this note is 41 minutes.  Lestine Mount, PA-C [facility] 02/06/22 2:31 PM  Please see [email or web address] for pager details.  From 7A-7P if no response, please call [phone] After hours, please call ELink [phone]

## 2022-02-07 DIAGNOSIS — I330 Acute and subacute infective endocarditis: Secondary | ICD-10-CM | POA: Diagnosis not present

## 2022-02-07 DIAGNOSIS — R7881 Bacteremia: Secondary | ICD-10-CM | POA: Diagnosis not present

## 2022-02-07 DIAGNOSIS — B952 Enterococcus as the cause of diseases classified elsewhere: Secondary | ICD-10-CM | POA: Diagnosis not present

## 2022-02-07 DIAGNOSIS — I76 Septic arterial embolism: Secondary | ICD-10-CM | POA: Diagnosis not present

## 2022-02-07 DIAGNOSIS — I469 Cardiac arrest, cause unspecified: Secondary | ICD-10-CM | POA: Diagnosis not present

## 2022-02-07 LAB — CBC WITH DIFFERENTIAL/PLATELET
Abs Immature Granulocytes: 0.84 10*3/uL — ABNORMAL HIGH (ref 0.00–0.07)
Basophils Absolute: 0.1 10*3/uL (ref 0.0–0.1)
Basophils Relative: 0 %
Eosinophils Absolute: 0.2 10*3/uL (ref 0.0–0.5)
Eosinophils Relative: 1 %
HCT: 24.2 % — ABNORMAL LOW (ref 36.0–46.0)
Hemoglobin: 7.8 g/dL — ABNORMAL LOW (ref 12.0–15.0)
Immature Granulocytes: 5 %
Lymphocytes Relative: 13 %
Lymphs Abs: 2.3 10*3/uL (ref 0.7–4.0)
MCH: 27.6 pg (ref 26.0–34.0)
MCHC: 32.2 g/dL (ref 30.0–36.0)
MCV: 85.5 fL (ref 80.0–100.0)
Monocytes Absolute: 1.3 10*3/uL — ABNORMAL HIGH (ref 0.1–1.0)
Monocytes Relative: 8 %
Neutro Abs: 12.9 10*3/uL — ABNORMAL HIGH (ref 1.7–7.7)
Neutrophils Relative %: 73 %
Platelets: 268 10*3/uL (ref 150–400)
RBC: 2.83 MIL/uL — ABNORMAL LOW (ref 3.87–5.11)
RDW: 19.1 % — ABNORMAL HIGH (ref 11.5–15.5)
WBC: 17.5 10*3/uL — ABNORMAL HIGH (ref 4.0–10.5)
nRBC: 3 % — ABNORMAL HIGH (ref 0.0–0.2)

## 2022-02-07 LAB — CULTURE, BLOOD (ROUTINE X 2)
Culture: NO GROWTH
Culture: NO GROWTH
Special Requests: ADEQUATE
Special Requests: ADEQUATE

## 2022-02-07 LAB — GLUCOSE, CAPILLARY
Glucose-Capillary: 123 mg/dL — ABNORMAL HIGH (ref 70–99)
Glucose-Capillary: 144 mg/dL — ABNORMAL HIGH (ref 70–99)
Glucose-Capillary: 151 mg/dL — ABNORMAL HIGH (ref 70–99)
Glucose-Capillary: 151 mg/dL — ABNORMAL HIGH (ref 70–99)
Glucose-Capillary: 153 mg/dL — ABNORMAL HIGH (ref 70–99)
Glucose-Capillary: 155 mg/dL — ABNORMAL HIGH (ref 70–99)
Glucose-Capillary: 160 mg/dL — ABNORMAL HIGH (ref 70–99)

## 2022-02-07 LAB — BASIC METABOLIC PANEL
Anion gap: 8 (ref 5–15)
BUN: 80 mg/dL — ABNORMAL HIGH (ref 8–23)
CO2: 23 mmol/L (ref 22–32)
Calcium: 8.9 mg/dL (ref 8.9–10.3)
Chloride: 115 mmol/L — ABNORMAL HIGH (ref 98–111)
Creatinine, Ser: 1.52 mg/dL — ABNORMAL HIGH (ref 0.44–1.00)
GFR, Estimated: 35 mL/min — ABNORMAL LOW (ref >60)
Glucose, Bld: 163 mg/dL — ABNORMAL HIGH (ref 70–99)
Potassium: 4.6 mmol/L (ref 3.5–5.1)
Sodium: 146 mmol/L — ABNORMAL HIGH (ref 135–145)

## 2022-02-07 LAB — MAGNESIUM: Magnesium: 2.4 mg/dL (ref 1.7–2.4)

## 2022-02-07 LAB — PHOSPHORUS: Phosphorus: 5.2 mg/dL — ABNORMAL HIGH (ref 2.5–4.6)

## 2022-02-07 MED ORDER — AMIODARONE HCL IN DEXTROSE 360-4.14 MG/200ML-% IV SOLN
60.0000 mg/h | INTRAVENOUS | Status: AC
  Administered 2022-02-07: 60 mg/h via INTRAVENOUS
  Filled 2022-02-07: qty 200

## 2022-02-07 MED ORDER — AMIODARONE HCL IN DEXTROSE 360-4.14 MG/200ML-% IV SOLN
30.0000 mg/h | INTRAVENOUS | Status: DC
  Administered 2022-02-07 – 2022-02-10 (×6): 30 mg/h via INTRAVENOUS
  Filled 2022-02-07 (×7): qty 200

## 2022-02-07 MED ORDER — AMLODIPINE BESYLATE 10 MG PO TABS
10.0000 mg | ORAL_TABLET | Freq: Every day | ORAL | Status: DC
  Administered 2022-02-07 – 2022-02-11 (×5): 10 mg
  Filled 2022-02-07 (×5): qty 1

## 2022-02-07 NOTE — Progress Notes (Signed)
   02/07/22 1400  Clinical Encounter Type  Visited With Family;Patient not available  Visit Type Critical Care;Spiritual support;Initial  Referral From Nurse ([place]. Rosana Berger, RN)  Consult/Referral To Chaplain Albertina Parr Morene Crocker)  Spiritual Encounters  Spiritual Needs Prayer;Emotional  Advance Directives (For Healthcare)  Does Patient Have a Medical Advance Directive? Unable to assess, patient is non-responsive or altered mental status  Mental Health Advance Directives  Does Patient Have a Mental Health Advance Directive? Unable to assess, patient is non-responsive or altered mental status   Chaplain responded to page to provide emotional and spiritual support to family of Ms. Alyssa Ingram. Met two of Alyssa Ingram's son at patient's bedside. Alyssa Ingram son's seem to be in state of shock and do not acknowledge severity of extreme critical nature of her illness. The son's remain prayerful and are hopeful for full recovery and believe their mother may still return home. Chaplain provided meaningful presence, reflective therapeutic listening, emotional and spiritual support. At the request of family we shared in intercessory prayer for comfort and peace that was consistent with patient and family beliefs. Chaplain will continue to monitor and assess spiritual needs and provide care as desired. [address] [place], [place] [phone]

## 2022-02-07 NOTE — Progress Notes (Addendum)
NAME:  Alyssa Ingram, MRN:  [id number], DOB:  [birth year], LOS: 28 ADMISSION DATE:  02/07/2022, CONSULTATION DATE:  01/13/2022 REFERRING MD:  Leonette Monarch - EDP CHIEF COMPLAINT:  PEA arrest  History of Present Illness:  76 year old woman who presented to [facility] ED 9/18 post-PEA arrest. PMHx significant for HTN, HLD, HFpEF, HOCM, asthma, T2DM, GERD, obesity.   History is provided by son who lives with the patient.   She has been feeling poorly, including weakness and malaise for several days.  Chronic leg and foot pain was worse than usual.  This was followed by several days of abdominal discomfort, though no diarrhea or nausea or vomiting.  She was incontinent of stool once which is unusual for her.  9/17 she also developed neck pain.  She usually sleeps laying flat and was able to sleep Saturday night. Since yesterday, they have seen her leaning over the sink and counter bracing herself.    She was feeling worse, becoming weaker and they had decided to take her to the ED, when she started to get worse and more altered.  911 called, and she had completely lost consciousness at that time. When EMS arrived, breathing was agonal and she was pulseless. CPR x 22 min with ROSC, [place] airway in the field.    Intubated on arrival to ED. Neurologically, she was doing enough to fight the tube that she did receive doses of sedation in ED. Required pressor initiation (Levophed) and broad-spectrum antibiotic coverage with Vanc/Zosyn.  On 9/19, patient was noted to be waking up and following commands. She was extubated 9/20; shortly after extubation she required reintubation for upper airway edema. On 9/21, noted to be less responsive. Underwent MRI Brain 9/22 which showed multiple embolic/septic strokes, raising concern for endocarditis. TEE 9/25 demonstrating highly mobile valvular vegetation c/w endocarditis.  Pertinent Medical History:   Past Medical History:  Diagnosis Date   Abnormal MRI, spine 11/2011    ?discitis L5-S1, s/p CT bx 12/12/11   Allergic rhinitis, cause unspecified 01/31/2013   Arthritis    Asthma    Cervical cancer ([place])    Chronic diastolic heart failure (HCC)    Diabetic retinopathy (HCC)    GERD (gastroesophageal reflux disease)    H/O cardiovascular stress test    Nuclear study in 2011 normal perfusion   HOCM (hypertrophic obstructive cardiomyopathy) ([place])    Echo 12/19/11: Severe LVH, EF 55-65%, dynamic obstruction, wall motion normal, grade 1 diastolic dysfunction, systolic anterior motion of the mitral valve with mild MS and mild MR, mild LAE, PASP 34   Hyperlipidemia    Hypertension    Kidney stones    Obesity    Type II or unspecified type diabetes mellitus without mention of complication, uncontrolled 01/31/2013   Significant Hospital Events: Including procedures, antibiotic start and stop dates in addition to other pertinent events   9/18 Admitted to ICU 9/19 waking up, following commands.  Weaning vasopressors. 9/20 Diuresed, passed SBT and extubated, but reintubated for upper airway edema. 9/21 less responsive on no sedation. MRI, EEG ordered. 9/22  MRI which showed multiple embolic/septic strokes. 9/25 Marginally responsive. TEE completed with +highly mobile vegetation c/w endocarditis. 9/26 Grossly unchanged neurologic exam. Afib with RVR overnight, persistent Afib today with rates 110s. Amiodarone gtt and heparin gtt started (cleared with Neuro).  Interim History / Subjective:  Afib with RVR overnight, rates as high as 130s Remains in Afib today with rates 100s-110s Amiodarone started for rate control, possible conversion Heparin gtt considered for anticoagulation,  cleared with Stroke team, deferred +Endocarditis on TEE yesterday Continuing ampicillin, ceftriaxone for coverage per ID Weaning on PSV 10/5, FiO2 40% Discussed at length with family, they would favor pursuing trach/PEG and SNF Would like additional information/to discuss with other family  members prior to making this decision  Objective:  Blood pressure 131/77, pulse (!) 102, temperature 99.7 F (37.6 C), temperature source Bladder, resp. rate (!) 22, height 5' (1.524 m), weight 83.3 kg, SpO2 97 %.    Vent Mode: CPAP;PSV FiO2 (%):  [40 %] 40 % Set Rate:  [26 bmp] 26 bmp Vt Set:  [360 mL] 360 mL PEEP:  [5 cmH20] 5 cmH20 Pressure Support:  [10 cmH20] 10 cmH20 Plateau Pressure:  [19 cmH20-20 cmH20] 20 cmH20   Intake/Output Summary (Last 24 hours) at 02/07/2022 0801 Last data filed at 02/07/2022 0600 Gross per 24 hour  Intake 1334.87 ml  Output 1490 ml  Net -155.13 ml    Filed Weights   02/05/22 0347 02/06/22 0435 02/07/22 0431  Weight: 84.9 kg 85 kg 83.3 kg   Physical Examination: General: Acute-on-chronically ill-appearing elderly woman in NAD. Intubated on ventilator. HEENT: [place]/AT, anicteric sclera, L pupil 4.5m, round and reactive; R pupil 332m round and reactive, moist mucous membranes. Neuro:  Stuporous/minimally responsive. Some spontaneous eye-opening, nonpurposeful; does not track examiner or readily open eyes to voice.  Will close mouth/grimace with mouth care and to pain. Withdraws to pain in all 4 extremities, BLE more readily than BUE. Not following commands. No spontaneous movement of extremities witnessed on exam. +Corneal, +Cough, and +Gag  CV: Irregularly irregular rhythm, rates 11338S+III/VI systolic murmur heard best at L sternal border. PULM: Breathing even and unlabored on vent (PSV 10/5, FiO2 40%). Lung fields CTAB. GI: Soft, nontender, nondistended. Normoactive bowel sounds. Extremities: Bilateral symmetric chronic brawny LE edema noted. Skin: Warm/dry, woody dark appearance of BLE c/w chronic venous insufficiency.  Assessment & Plan:   PEA cardiac arrest secondary to acute decompensated HFpEF  Cardiogenic and distributive shock Hypertensive cardiomyopathy with demand related ischemia. TEE 9/25 with +highly mobile vegetation noted, c/w  endocarditis. - Long-term antibiotic therapy as below - Cardiac monitoring - Post-arrest neuroprognostication per protocol  Encephalopathy  Innumerable supra and infratentorial acute infarcts, evolving right MCA/PCA infarct involving in right temporal lobe, multiple central and peripherally positioned sites of micro-hemorrhages  Thought to be septic emboli in the setting of now known endocarditis. - Stroke/Neuro following, appreciate recommendations - Further imaging per Neuro - EEG as indicated - TCDs per protocol - Neuroprotective measures: HOB > 30 degrees, normoglycemia, normothermia, electrolytes WNL  Enterococcal bacteremia with marked leukocytosis possible from aspiration pneumonia vs UTI Endocarditis with CNS emboli  Prior to admission (8/18) patient treated for UTI with Cipro 250 mg BID> no cultures obtained, U/A 9/18 with many bacteria, culture on admission negative. TEE 9/25 with +highly mobile vegetation c/w endocarditis. - ID following ,appreciate recs - Trend WBC, fever curve - Will require long-term duration of antibiotic therapy for endocarditis; would not proceed with any type of invasive cardiac procedure involving valvular manipulation until source control is achieved and the endocardial space is sterile - Continue ampicillin, rocephin  Atrial fibrillation with RVR Developed Afib with RVR 9/25PM. Persistently in Afib 9/26, rates 100s. - Amiodarone infusion ordered for rate control - Start ASA per Stroke team - Heparin gtt considered for anticoagulation, cleared with Stroke team, deferred - Cardiac monitoring  Acute hypoxic respiratory failure secondary to cardiogenic pulmonary edema   Respiratory Insuffiencey in setting of  encephalopathy  Extubated 9/20, re-intubated shortly after for upper airway edema  - Continue full vent support (4-8cc/kg IBW) - Wean as able, tolerating PSV 9/26AM (10/5, FiO2 40%) - Wean FiO2 for O2 sat > 90% - Daily WUA/SBT as mental status  allows, neurologic status currently remains largest barrier to extubation - VAP bundle - Pulmonary hygiene - PAD protocol for sedation: Fentanyl and Versed for goal RASS 0 to -1; not currently on any sedation - Intermittent CXR as clinically indicated  Mild AKI > Improving  - Trend BMP - Replete electrolytes as indicated - Monitor I&Os - Avoid nephrotoxic agents as able - Ensure adequate renal perfusion  Hyperglycemia  Plan - SSI - TF coverage Q4H - CBGs Q4H - Goal CBG 140-180  GOC Brief GOC discussion held 9/26 with patient's daughter (at bedside) and son (via speakerphone). Discussed patient's current clinical status and neurological assessment. Discussed likely months of therapy and need for 24H care if patient is able to recover her neurological function post-multiple neurologic insults (cardiac arrest, innumerable septic/embolic strokes on MRI). - Discussed need for tracheostomy tube/PEG tube for long term recovery; family is favoring pursuing this but would like more information and to discuss with additional family members prior to decision - Likely long-term SNF care - PT/OT/SLP when able to participate in care - Poor neurologic prognosis  Best Practice: (right click and "Reselect all SmartList Selections" daily)   Diet/type: tube feeds DVT prophylaxis: prophylactic heparin  GI prophylaxis: PPI Lines: PICC Right Brachial 9/18  Foley:  Foley 9/18  Code Status:  full code Last date of multidisciplinary goals of care discussion [9/26 Remains Full code, discussed with patient's daughter (at bedside), son (via phone), likely plan to pursue tracheostomy/PEG.]  Critical care time:   The patient is critically ill with multiple organ system failure and requires high complexity decision making for assessment and support, frequent evaluation and titration of therapies, advanced monitoring, review of radiographic studies and interpretation of complex data.   Critical Care Time  devoted to patient care services, exclusive of separately billable procedures, described in this note is 38 minutes.  Lestine Mount, PA-C [facility] 02/07/22 8:01 AM  Please see [email or web address] for pager details.  From 7A-7P if no response, please call [phone] After hours, please call ELink [phone]

## 2022-02-07 NOTE — Progress Notes (Signed)
eLink Physician-Brief Progress Note Patient Name: Alyssa Ingram DOB: Jan 15, 1946 MRN: [id number]   Date of Service  02/07/2022  HPI/Events of Note   76 y.o. female with history of PMHx of cervical cancer, DM2, diabetic retinopathy, HLD, HTN,. Obesity and chronic HFpEF / HOCM, s/p PEA cardiac arrest. On 9/22 she underwent MRI which showed multiple embolic/septic strokes. Course complicated by enterococcal faecalis bacteremia , TEE with large vegetation on anterior mitral valve leaflet.   E-link called re: new onset Afib     eICU Interventions  - Afib rates 98-110. No intervention for now - secondary to critical illness, adrenergic stimulation from above factors  - replete lytes, esp Mag  - if rates persistently > 120-130, can trial one time push to assess response  - no anticoagulation given concerns for endocarditis.         Alyssa Ingram N Alyssa Ingram 02/07/2022, 4:52 AM

## 2022-02-07 NOTE — Progress Notes (Signed)
[facility] for Infectious Disease  Date of Admission:  02/01/2022           Reason for visit: Follow up on E faecalis endocarditis  Current antibiotics: Ampicillin Ceftriaxone   ASSESSMENT:    76 y.o. female admitted with:  Enterococcal bacteremia  Mitral valve endocarditis CNS septic emboli Patient with Enterococcus faecalis positive blood cultures 01/20/2022 with negative cultures documented on 02/01/2022.  Found to have CNS emboli with MV endocarditis.  Currently on ampicillin and ceftriaxone.     Acute hypoxic respiratory failure Remains intubated on the ventilator.   Status post PEA cardiac arrest   Acute kidney injury Creatinine improving.   Diabetes Hemoglobin A1c 7.2.  RECOMMENDATIONS:    Continue ceftriaxone 2 gm q12h Continue ampicillin 2gm q8h Prognosis unfortunately poor.  Agree with palliative care discussions Following   Principal Problem:   Enterococcal bacteremia Active Problems:   Cardiac arrest ([place])   Pressure injury of skin   Septic embolism ([facility])   Acute bacterial endocarditis    MEDICATIONS:    Scheduled Meds:  Chlorhexidine Gluconate Cloth  6 each Topical Daily   heparin  5,000 Units Subcutaneous Q8H   insulin aspart  0-15 Units Subcutaneous Q4H   insulin aspart  5 Units Subcutaneous Q4H   ipratropium-albuterol  3 mL Nebulization Q6H   lidocaine  2 patch Transdermal Q24H   multivitamin  15 mL Per Tube Daily   mouth rinse  15 mL Mouth Rinse Q2H   pantoprazole  40 mg Per Tube Daily   sodium chloride flush  10-40 mL Intracatheter Q12H   Continuous Infusions:  sodium chloride     ampicillin (OMNIPEN) IV Stopped (02/07/22 0236)   cefTRIAXone (ROCEPHIN)  IV Stopped (02/06/22 2232)   feeding supplement (VITAL AF 1.2 CAL) 55 mL/hr at 02/07/22 0600   PRN Meds:.acetaminophen, albuterol, docusate, fentaNYL (SUBLIMAZE) injection, midazolam, ondansetron (ZOFRAN) IV, mouth rinse, oxyCODONE, polyethylene glycol, polyvinyl alcohol,  sodium chloride flush  SUBJECTIVE:   24 hour events:  New onset A fib overnight TEE confirmed large MV vegetation Tmax 99.7 CBC stable Creatinine improved 9/20 blood cx NGTD  Patient remains intubated and nonresponsive.  Review of Systems  Unable to perform ROS: Intubated      OBJECTIVE:   Blood pressure (!) 141/83, pulse 95, temperature 99.7 F (37.6 C), temperature source Bladder, resp. rate (!) 24, height 5' (1.524 m), weight 83.3 kg, SpO2 91 %. Body mass index is 35.87 kg/m.  Physical Exam Constitutional:      Comments: Patient is intubated and ill-appearing.  She does not respond.  HENT:     Head: Normocephalic and atraumatic.     Mouth/Throat:     Comments: Endotracheal tube in place. Abdominal:     General: There is no distension.     Palpations: Abdomen is soft.  Musculoskeletal:     Right lower leg: Edema present.     Left lower leg: Edema present.     Comments: Lower extremity woody, brawny edema consistent with venous stasis Right upper extremity PICC line  Skin:    General: Skin is warm and dry.  Neurological:     Comments: Intubated      Lab Results: Lab Results  Component Value Date   WBC 17.5 (H) 02/07/2022   HGB 7.8 (L) 02/07/2022   HCT 24.2 (L) 02/07/2022   MCV 85.5 02/07/2022   PLT 268 02/07/2022    Lab Results  Component Value Date   NA 146 (H) 02/07/2022  K 4.6 02/07/2022   CO2 23 02/07/2022   GLUCOSE 163 (H) 02/07/2022   BUN 80 (H) 02/07/2022   CREATININE 1.52 (H) 02/07/2022   CALCIUM 8.9 02/07/2022   GFRNONAA 35 (L) 02/07/2022   GFRAA >60 12/09/2015    Lab Results  Component Value Date   ALT 41 02/02/2022   AST 24 02/02/2022   ALKPHOS 75 02/02/2022   BILITOT 0.8 02/02/2022       Component Value Date/Time   CRP <1.0 01/18/2021 1715       Component Value Date/Time   ESRSEDRATE 27 01/18/2021 1715     I have reviewed the micro and lab results in Epic.  Imaging: ECHO TEE  Result Date: 02/06/2022     TRANSESOPHOGEAL ECHO REPORT   Patient Name:   Alyssa Ingram Date of Exam: 02/06/2022 Medical Rec #:  [id number]        Height:       60.0 in Accession #:    [id number]       Weight:       187.4 lb Date of Birth:  24-Jan-1946        BSA:          1.816 m Patient Age:    58 years         BP:           132/79 mmHg Patient Gender: F                HR:           104 bpm. Exam Location:  Inpatient Procedure: 3D Echo, Transesophageal Echo, Cardiac Doppler and Color Doppler Indications:     Bacteremia  History:         Patient has prior history of Echocardiogram examinations, most                  recent 02/08/2022. Cardiomyopathy and Hypertrophic                  Cardiomyopathy; Arrythmias:Cardiac Arrest.  Sonographer:     Roseanna Rainbow RDCS Referring Phys:  [id number] Kipp Brood Diagnosing Phys: Sanda Klein MD PROCEDURE: After discussion of the risks and benefits of a TEE, an informed consent was obtained from a family member. The transesophogeal probe was passed without difficulty through the esophogus of the patient. Imaged were obtained with the patient in a supine position. Sedation performed by performing physician. Patients was under conscious sedation during this procedure. Anesthetic administered: 5mg of Fentanyl, 4.'0mg'$  of Versed. The patient's vital signs; including heart rate, blood pressure, and oxygen saturation; remained stable throughout the procedure. The patient developed no complications during the procedure. IMPRESSIONS  1. Left ventricular ejection fraction, by estimation, is 70 to 75%. The left ventricle has hyperdynamic function. The left ventricle has no regional wall motion abnormalities. There is severe concentric left ventricular hypertrophy. Left ventricular diastolic function could not be evaluated.  2. Right ventricular systolic function is normal. The right ventricular size is normal. There is moderately elevated pulmonary artery systolic pressure. The estimated right ventricular systolic  pressure is 517.4mmHg.  3. Left atrial size was severely dilated. No left atrial/left atrial appendage thrombus was detected.  4. Right atrial size was mildly dilated.  5. There is at least one highly mobile echodensity attached to the middle (P2) scallop of the posterior leaflet, measuring up to 19 mm in length, consistent with a vegetation or large ruptured chorda(e). In addition, there may be at least one other  mobile echodensity attached to the anterior mitral leaflet. There is systolic anterior motion of the mitral leaflet, but there is no significant LV outflow obstruction. The mitral valve is degenerative. Moderate to severe mitral valve regurgitation. Mild  mitral stenosis. The mean mitral valve gradient is 6.1 mmHg with average heart rate of 104 bpm. Moderate to severe mitral annular calcification.  6. There is a tiny mobile echodensity that probably represents a Lambl's excrescence. Calcificatis prominent on the noncoronary cusp. The aortic valve is normal in structure. There is mild calcification of the aortic valve. There is mild thickening of the aortic valve. Aortic valve regurgitation is not visualized. Aortic valve sclerosis/calcification is present, without any evidence of aortic stenosis.  7. There is Moderate (Grade III) protruding plaque involving the aortic arch. Comparison(s): Compared to the 01/26/2022 study, major findings are unchanged. Compared to the 06/19/2021 study, the mitral regurgitation is much more severe and the jet is now eccentric, directed anterolaterally. The jet direction is consistent with flail motion or perforation of the middle (P2) scallop of the posterior mitral leaflet. The echo findings do not suggest that the MR is due to systolic anterior motion on the mitral valve. This could represent primary chordal rupture or endocarditis (the  clinical scenario and the impression that there is more than one echodensity attached to the mitral valve supports a diagnosis of  endocarditis). FINDINGS  Left Ventricle: Left ventricular ejection fraction, by estimation, is 70 to 75%. The left ventricle has hyperdynamic function. The left ventricle has no regional wall motion abnormalities. The left ventricular internal cavity size was normal in size. There is severe concentric left ventricular hypertrophy. Left ventricular diastolic function could not be evaluated due to atrial fibrillation. Left ventricular diastolic function could not be evaluated. Right Ventricle: The right ventricular size is normal. No increase in right ventricular wall thickness. Right ventricular systolic function is normal. There is moderately elevated pulmonary artery systolic pressure. The tricuspid regurgitant velocity is 3.34 m/s, and with an assumed right atrial pressure of 8 mmHg, the estimated right ventricular systolic pressure is 99.8 mmHg. Left Atrium: Left atrial size was severely dilated. No left atrial/left atrial appendage thrombus was detected. Right Atrium: Right atrial size was mildly dilated. Prominent Eustachian valve. Pericardium: There is no evidence of pericardial effusion. Mitral Valve: There is at least one highly mobile echodensity attached to the middle (P2) scallop of the posterior leaflet, measuring up to 19 mm in length, consistent with a vegetation or large ruptured chorda(e). In addition, there may be at least one other mobile echodensity attached to the anterior mitral leaflet. There is systolic anterior motion of the mitral leaflet, but there is no significant LV outflow obstruction. The mitral valve is degenerative in appearance. Moderate to severe mitral annular calcification. Moderate to severe mitral valve regurgitation, with eccentric posteriorly directed jet. Mild mitral valve stenosis. MV peak gradient, 13.1 mmHg. The mean mitral valve gradient is 6.1 mmHg with average heart rate of 104 bpm. Tricuspid Valve: The tricuspid valve is normal in structure. Tricuspid valve  regurgitation is trivial. Aortic Valve: There is a tiny mobile echodensity that probably represents a Lambl's excrescence. Calcificatis prominent on the noncoronary cusp. The aortic valve is normal in structure. There is mild calcification of the aortic valve. There is mild thickening of the aortic valve. Aortic valve regurgitation is not visualized. Aortic valve sclerosis/calcification is present, without any evidence of aortic stenosis. Pulmonic Valve: The pulmonic valve was grossly normal. Pulmonic valve regurgitation is not visualized. Aorta: The  aortic root, ascending aorta, aortic arch and descending aorta are all structurally normal, with no evidence of dilitation or obstruction. There is moderate (Grade III) protruding plaque involving the aortic arch. Venous: A systolic blunting flow pattern is recorded from the left upper pulmonary vein and the right upper pulmonary vein. IAS/Shunts: No atrial level shunt detected by color flow Doppler.  MITRAL VALVE               TRICUSPID VALVE MV Area (PHT): 2.38 cm    TR Peak grad:   44.6 mmHg MV Peak grad:  13.1 mmHg   TR Vmax:        334.00 cm/s MV Mean grad:  6.1 mmHg MV Vmax:       1.81 m/s MV Vmean:      101.0 cm/s MV Decel Time: 319 msec Mihai Croitoru MD Electronically signed by Sanda Klein MD Signature Date/Time: 02/06/2022/4:54:14 PM    Final    VAS [place] TRANSCRANIAL DOPPLER  Result Date: 02/06/2022  Transcranial Doppler Patient Name:  Alyssa Ingram  Date of Exam:   02/06/2022 Medical Rec #: [id number]         Accession #:    [id number] Date of Birth: July 24, 1945         Patient Gender: F Patient Age:   76 years Exam Location:  [facility] Procedure:      VAS [place] TRANSCRANIAL DOPPLER Referring Phys: Janine Ores --------------------------------------------------------------------------------  Indications: Stroke. History: Hyperlipidemia, hyperteison, obesity, Typ II diabetes. Limitations: Age. poor windows Limitations for diagnostic windows: Unable  to insonate right transtemporal window. Performing Technologist: Oda Cogan RDMS, RVT  Examination Guidelines: A complete evaluation includes B-mode imaging, spectral Doppler, color Doppler, and power Doppler as needed of all accessible portions of each vessel. Bilateral testing is considered an integral part of a complete examination. Limited examinations for reoccurring indications may be performed as noted.  +----------+-------------+----------+-----------+------------------+ RIGHT TCD Right VM (cm)Depth (cm)Pulsatility     Comment       +----------+-------------+----------+-----------+------------------+ MCA           24.00                         contralateral side +----------+-------------+----------+-----------+------------------+ ACA                                           not insonated    +----------+-------------+----------+-----------+------------------+ Term ICA                                      not insonated    +----------+-------------+----------+-----------+------------------+ PCA           17.00                                            +----------+-------------+----------+-----------+------------------+ Opthalmic     18.00                                            +----------+-------------+----------+-----------+------------------+ ICA siphon    40.00                                            +----------+-------------+----------+-----------+------------------+  Vertebral    -19.00                                            +----------+-------------+----------+-----------+------------------+  +----------+------------+----------+-----------+-------+ LEFT TCD  Left VM (cm)Depth (cm)PulsatilityComment +----------+------------+----------+-----------+-------+ MCA          22.00                                 +----------+------------+----------+-----------+-------+ ACA          -20.00                                 +----------+------------+----------+-----------+-------+ PCA          23.00                                 +----------+------------+----------+-----------+-------+ Opthalmic    18.00                                 +----------+------------+----------+-----------+-------+ ICA siphon   61.00                                 +----------+------------+----------+-----------+-------+ Vertebral    -18.00                                +----------+------------+----------+-----------+-------+  +------------+-----+-------------+             VM cm   Comment    +------------+-----+-------------+ Prox Basilar     not insonated +------------+-----+-------------+ Summary:  Poor acoustic windows throughout limit evaluation. Not all blood vessels could be insonated.antegrade flow noted in both middle cerebral, left anterior cerebral, bilateral posterior cerebral, opthalmics, carotid siphons and right vertebral arteries. *See table(s) above for TCD measurements and observations.  Diagnosing physician: Antony Contras MD Electronically signed by Antony Contras MD on 02/06/2022 at 3:36:34 PM.    Final    VAS US CAROTID  Result Date: 02/06/2022 Carotid Arterial Duplex Study Patient Name:  Alyssa Ingram  Date of Exam:   02/06/2022 Medical Rec #: [id number]         Accession #:    [id number] Date of Birth: [birth year]         Patient Gender: F Patient Age:   47 years Exam [facility]:  [facility] Procedure:      VAS US CAROTID Referring Phys: Janine Ores --------------------------------------------------------------------------------  Indications:      CVA. Risk Factors:     Hypertension, hyperlipidemia, Diabetes, coronary artery                   disease. Comparison Study: Carotid 2014 Performing Technologist: Oda Cogan RDMS, RVT  Examination Guidelines: A complete evaluation includes B-mode imaging, spectral Doppler, color Doppler, and power Doppler as needed of all accessible portions of each  vessel. Bilateral testing is considered an integral part of a complete examination. Limited examinations for reoccurring indications may be performed as noted.  Right Carotid Findings: +----------+--------+--------+--------+------------------+------------------+           PSV cm/sEDV cm/sStenosisPlaque DescriptionComments           +----------+--------+--------+--------+------------------+------------------+  CCA Prox  95      15                                                   +----------+--------+--------+--------+------------------+------------------+ CCA Distal49      10                                intimal thickening +----------+--------+--------+--------+------------------+------------------+ ICA Prox  31      11      1-39%                                        +----------+--------+--------+--------+------------------+------------------+ ICA Distal78      24                                intimal thickening +----------+--------+--------+--------+------------------+------------------+ ECA       55      9                                                    +----------+--------+--------+--------+------------------+------------------+ +----------+--------+-------+----------------+-------------------+           PSV cm/sEDV cmsDescribe        Arm Pressure (mmHG) +----------+--------+-------+----------------+-------------------+ [id number]            Multiphasic, WNL                    +----------+--------+-------+----------------+-------------------+ +---------+--------+--+--------+--+---------+ VertebralPSV cm/s41EDV cm/s17Antegrade +---------+--------+--+--------+--+---------+  Left Carotid Findings: +----------+--------+--------+--------+------------------+------------------+           PSV cm/sEDV cm/sStenosisPlaque DescriptionComments           +----------+--------+--------+--------+------------------+------------------+ CCA Prox  142      21                                                   +----------+--------+--------+--------+------------------+------------------+ CCA Distal65      13                                                   +----------+--------+--------+--------+------------------+------------------+ ICA Prox  38      17      1-39%                     intimal thickening +----------+--------+--------+--------+------------------+------------------+ ICA Mid   43      15                                                   +----------+--------+--------+--------+------------------+------------------+ ICA Distal60      24                                                   +----------+--------+--------+--------+------------------+------------------+ +----------+--------+--------+----------------+-------------------+  PSV cm/sEDV cm/sDescribe        Arm Pressure (mmHG) +----------+--------+--------+----------------+-------------------+ [id number]             Multiphasic, WNL                    +----------+--------+--------+----------------+-------------------+ +---------+--------+--+--------+--+---------+ VertebralPSV cm/s58EDV cm/s21Antegrade +---------+--------+--+--------+--+---------+   Summary: Right Carotid: Velocities in the right ICA are consistent with a 1-39% stenosis. Left Carotid: Velocities in the left ICA are consistent with a 1-39% stenosis. Vertebrals: Bilateral vertebral arteries demonstrate antegrade flow. *See table(s) above for measurements and observations.  Electronically signed by Antony Contras MD on 02/06/2022 at 3:34:38 PM.    Final    ECHO TEE  Result Date: 02/06/2022 Sanda Klein, MD     02/06/2022 12:36 PM INDICATIONS: endocarditis PROCEDURE: Informed consent was obtained prior to the procedure. The risks, benefits and alternatives for the procedure were discussed and the patient comprehended these risks.  Risks include, but are not limited to, cough,  sore throat, vomiting, nausea, somnolence, esophageal and stomach trauma or perforation, bleeding, low blood pressure, aspiration, pneumonia, infection, trauma to the teeth and death.  After a procedural time-out,  the patient was administered a total of Versed 4 mg and Fentanyl 100 mcg IV (patient on mechanical ventilation already).  The patient's heart rate, blood pressure, and oxygen saturationweare monitored continuously during the procedure. The period of conscious sedation was 20 minutes, of which I was present face-to-face 100% of this time. The transesophageal probe was inserted in the esophagus and stomach without difficulty and multiple views were obtained.  The patient was kept under observation until the patient left the procedure room.  The patient left the procedure room in stable condition. Agitated microbubble saline contrast was not administered. COMPLICATIONS:  There were no immediate complications. FINDINGS: There is a large, highly mobile vegetation on the anterior mitral leaflet. 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 There is significant mitral insufficiency, probably moderate to severe, eccentric, anteriorly directed. The left ventricle is severely hypertrophic and hyperdynamic. There is aortic valve sclerosis without stenosis. 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 Prominent aortic arch atherosclerosis. RECOMMENDATIONS:   Treatment for endocarditis. Follow MR with TTE. Time Spent Directly with the Patient: 45 minutes Mihai Croitoru 02/06/2022, 12:31 PM   DG CHEST PORT 1 VIEW  Result Date: 02/05/2022 CLINICAL DATA:  Respiratory failure EXAM: PORTABLE CHEST 1 VIEW COMPARISON:  02/02/2022 FINDINGS: Interval placement of large bore feeding tube which is coiled in the gastric body. Endotracheal tube terminates at the level of the clavicular heads.  Stable cardiomegaly. Aortic atherosclerosis. Worsening patchy bilateral airspace opacities, particularly within the right upper lobe. No pneumothorax. IMPRESSION: 1. Interval placement of feeding tube, which is coiled in the gastric body. 2. Worsening bilateral airspace opacities, particularly within the right upper lobe. Electronically Signed   By: Davina Poke D.O.   On: 02/05/2022 11:08     Imaging  independently reviewed in Epic.    Raynelle Highland for Infectious Disease [facility] [phone] pager 02/07/2022, 6:40 AM

## 2022-02-07 NOTE — IPAL (Signed)
  Interdisciplinary Goals of Care Family Meeting   Date carried out: 02/07/2022  Location of the meeting: Bedside  Member's involved: Physician, Bedside Registered Nurse, and Family Member or next of kin  Durable Power of Attorney or acting medical decision maker: Sister and brother    Discussion: We discussed goals of care for [employer] .  Discussed poor neurological exam and imaging.  We discussed timeline for recovery, if it can occur, would be weeks to months and she would require trach/PEG and SNF placement.  Alternative would be switching goals of care to comfort.  They would like to pursue SNF placement.  Code status: Full Code  Disposition: SNF/LTAC  Time spent for the meeting: 15 minutes    Candee Furbish, MD  02/07/2022, 10:03 AM

## 2022-02-07 NOTE — TOC Initial Note (Addendum)
Transition of Care [facility]) - Initial/Assessment Note    Patient Details  Name: Alyssa Ingram MRN: [id number] Date of Birth: [birth year]  Transition of Care [facility]) CM/SW Contact:    Bethena Roys, RN Phone Number: 02/07/2022, 12:47 PM  Clinical Narrative: Patient presented for PEA arrest-admitted to the ICU. Patient intubated and extubated on 9-20; shortly after re-intubated. Patient has family support. Provider notes reflect a discussion for [facility]. If the provider wants LTAC to be explored please place a consult to TOC. TOC will continue to follow for disposition needs.     1749 02-07-22 Case Manager received a consult for LTAC- Case Manager did reach out to [facility] only to see if the patient is a candidate. Case Manager will continue to follow for additional transition of care needs.       [phone] 02-07-22 Per [facility] Liaison- [facility] can offer [facility] the patient if family chooses to pursue trach and peg. Patient will then need 3 failed wean attempts post trach before admission. Awaiting confirmation from [facility] as well.     02-07-22 1716 Case Manager received confirmation that [facility] can offer a bed on the patient. [employer] Medicare may want a peg placed before transition. Case Manager will follow up in the morning at rounds.         Expected Discharge Plan: Skilled Nursing Facility Barriers to Discharge: Continued Medical Work up  Expected Discharge Plan and Services Expected Discharge Plan: [place]     Post Acute Care Choice: [place]   Emotional Assessment Appearance:: Appears stated age      Alcohol / Substance Use: Not Applicable Psych Involvement: No (comment)  Admission diagnosis:  Cardiac arrest [facility]) [I46.9] Patient Active Problem List   Diagnosis Date Noted   Pressure injury of skin 02/06/2022   Septic embolism ([place])    Acute bacterial endocarditis    Enterococcal bacteremia    Cardiac arrest ([place]) 01/15/2022   Travel  advice encounter 07/17/2021   Acute on chronic heart failure ([place]) 06/19/2021   Acute respiratory failure with hypoxia ([place]) 06/19/2021   Retinal artery occlusion, 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   Vitamin D deficiency 10/02/2020   AKI (acute kidney injury) ([place]) 07/21/2020   Right elbow pain 05/06/2020   Throat pain 11/04/2019   Neuropathy 11/04/2019   Insomnia 03/09/2019   Daytime somnolence 07/17/2018   Paresthesia of bilateral legs 07/17/2018   Acute pharyngitis 07/17/2018   Greater trochanteric bursitis of left hip 01/30/2018   Low back pain 06/21/2017   Lateral epicondylitis of right elbow 06/20/2017   HLD (hyperlipidemia) 08/24/2016   Right shoulder pain 08/24/2016   Debility 08/10/2016   Viral illness 06/13/2016   Headache 12/09/2015   Degenerative arthritis of left knee 11/23/2015   Acute renal failure syndrome ([place]) 02/04/2015   Bilateral foot pain 01/26/2015   History of knee replacement procedure of right knee 08/06/2014   Arthritis of left lower extremity 08/06/2014   Cellulitis of left leg 04/14/2014   Acute bronchitis 04/14/2014   Chronic venous insufficiency 04/14/2014   Left leg pain 03/12/2014   Patellar tendonitis of left knee 07/31/2013   Superficial phlebitis 01/31/2013   Chronic pain of right knee 01/31/2013   Diabetes ([place]) 01/31/2013   Allergic rhinitis 01/31/2013   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   Left lumbar radiculopathy 11/22/2011   Right knee pain 09/13/2011   Cough 09/13/2011   Arthritis    Mild persistent asthma    Cervical cancer ([place])    GERD (  gastroesophageal reflux disease)    Hypertension    Kidney stones    CHF (congestive heart failure) ([place])    Encounter for well adult exam with abnormal findings 09/10/2011   PCP:  [name], MD Pharmacy:   [id number] [place] [place]Phone: [phone]: [phone] [place]Phone: [phone]: [phone] Readmission Risk Interventions    06/20/2021    9:52 AM  Readmission Risk Prevention Plan  Transportation Screening Complete  PCP or Specialist Appt within 5-7 Days Complete  Home Care Screening Complete  Medication Review (RN CM) Complete

## 2022-02-07 NOTE — Progress Notes (Signed)
Nutrition Follow-up  DOCUMENTATION CODES:   Not applicable  INTERVENTION:  Continue tube feeding via cortrak tube: Vital 1.2 at 55 ml/h (1320 ml per day)  Provides 1584 kcal, 99 gm protein, 1071 ml free water daily Consider free water flushes for high Na   MVI with minerals daily via tube   NUTRITION DIAGNOSIS:   Inadequate oral intake related to inability to eat as evidenced by NPO status. - remains applicable  GOAL:   Provide needs based on ASPEN/SCCM guidelines - progressing, cortrak in place with TF infusing  MONITOR:   Vent status, Labs, I & O's, Weight trends  REASON FOR ASSESSMENT:   Consult Enteral/tube feeding initiation and management  ASSESSMENT:   Pt with hx of GERD, HTN, HLD, CHF, DM type 2, and hx cervical cancer admitted after suffering cardiac arrest at home. Family began CPR prior to EMS arrival. 22 minutes before ROSC was obtained.  9/18 - Admitted, intubated 9/21 - Extubated, reintubated ~1 hour later, cortrak tube placed (gastric fundus) 9/22 - MRI showed multiple embolic/septic strokes 0/08 - TEE showed  large vegetation on anterior mitral leaflet  Pt continues with TF via cortrak at goal of 69m/h. Family meeting held today and family would like to continue aggressive measures. Fecal management system in place with 1071mrecorded over the last 24 hours. Will continue current TF regimen for now.     Patient is currently intubated on ventilator support MV: 8.4 L/min Temp (24hrs), Avg:99.1 F (37.3 C), Min:98.6 F (37 C), Max:99.7 F (37.6 C)   Intake/Output Summary (Last 24 hours) at 02/07/2022 1011 Last data filed at 02/07/2022 0800 Gross per 24 hour  Intake 1214.87 ml  Output 1430 ml  Net -215.13 ml  Net IO Since Admission: 5,645.38 mL [02/07/22 1011] Fecal Management System: 10072mver the last 24 hours  Nutritionally Relevant Medications: Scheduled Meds:  insulin aspart  0-15 Units Subcutaneous Q4H   insulin aspart  5 Units  Subcutaneous Q4H   multivitamin  15 mL Per Tube Daily   pantoprazole  40 mg Per Tube Daily   Continuous Infusions:  ampicillin (OMNIPEN) IV 2 g (02/07/22 0908)   feeding supplement (VITAL AF 1.2 CAL) 55 mL/hr at 02/07/22 0800   PRN Meds: docusate, ondansetron, polyethylene glycol  Labs Reviewed: Na 146, chloride 115 BUN 80, creatinine 1.52 Phosphorus 5.2 CBG ranges from 118-155 mg/dL over the last 24 hours HgbA1c - 7.2%  NUTRITION - FOCUSED PHYSICAL EXAM: Flowsheet Row Most Recent Value  Orbital Region No depletion  Upper Arm Region Mild depletion  Thoracic and Lumbar Region No depletion  Buccal Region No depletion  Temple Region No depletion  Clavicle Bone Region No depletion  Clavicle and Acromion Bone Region No depletion  Scapular Bone Region No depletion  Dorsal Hand Severe depletion  Patellar Region No depletion  Anterior Thigh Region No depletion  Posterior Calf Region No depletion  Edema (RD Assessment) Severe  [BLE, orbital]  Hair Reviewed  Eyes Reviewed  Mouth Reviewed  Skin Reviewed  Nails Reviewed    Diet Order:   Diet Order             Diet NPO time specified  Diet effective now                   EDUCATION NEEDS:  Not appropriate for education at this time  Skin:  Skin Assessment: Reviewed RN Assessment (sacrum stage 1)  Last BM:  9/25 - type 7, flexiseal in place  Height:  Ht Readings from Last 1 Encounters:  02/06/22 5' (1.524 m)    Weight:  Wt Readings from Last 1 Encounters:  02/07/22 83.3 kg    Ideal Body Weight:  45.5 kg  BMI:  Body mass index is 35.87 kg/m.  Estimated Nutritional Needs:  Kcal:  1500-1700 kcal/d Protein:  90-115 g/d Fluid:  1.5L/d    Ranell Patrick, RD, LDN Clinical Dietitian RD pager # available in [facility]  After hours/weekend pager # available in [facility]

## 2022-02-07 NOTE — Progress Notes (Signed)
STROKE TEAM PROGRESS NOTE   INTERVAL HISTORY Patient went into atrial fibrillation overnight.  Neurological exam remains unchanged.  She remains intubated and sedated for respiratory failure Extensive discussion with patient's daughter  at the bedside and her son over the phone about her clinical exam, prognosis and answered questions.  Discussed risk-benefit of anticoagulation for A-fib for stroke prevention versus increase bleeding risk in patient with endocarditis and recommend hold off on anticoagulation at least for couple of weeks and use aspirin alone instead and they are in agreement with plan.  Discussed with Dr. Tamala Julian critical care medicine who also agrees Vitals:   02/07/22 1445 02/07/22 1449 02/07/22 1500 02/07/22 1515  BP:  (!) 156/97 (!) 144/95   Pulse: 94 87 (!) 103 84  Resp: (!) 26 (!) 28 (!) 26 (!) 24  Temp:    99.7 F (37.6 C)  TempSrc:    Bladder  SpO2: 96% 97% 94% 97%  Weight:      Height:       CBC:  Recent Labs  Lab 02/06/22 0323 02/07/22 0415  WBC 18.0* 17.5*  NEUTROABS 13.2* 12.9*  HGB 8.0* 7.8*  HCT 25.3* 24.2*  MCV 85.2 85.5  PLT 265 427   Basic Metabolic Panel:  Recent Labs  Lab 02/06/22 0323 02/07/22 0415  NA 147* 146*  K 4.8 4.6  CL 110 115*  CO2 25 23  GLUCOSE 131* 163*  BUN 82* 80*  CREATININE 1.77* 1.52*  CALCIUM 9.3 8.9  MG 2.5* 2.4  PHOS 5.2* 5.2*   Lipid Panel:  Recent Labs  Lab 02/06/22 0323  CHOL 119  TRIG 70  HDL 34*  CHOLHDL 3.5  VLDL 14  LDLCALC 71   HgbA1c:  No results for input(s): "HGBA1C" in the last 168 hours.  Urine Drug Screen:  No results for input(s): "LABOPIA", "COCAINSCRNUR", "LABBENZ", "AMPHETMU", "THCU", "LABBARB" in the last 168 hours.   Alcohol Level No results for input(s): "ETH" in the last 168 hours.  IMAGING past 24 hours No results found.  --PHYSICAL EXAM-- General: Critically ill-appearing obese female, orally intubated HEENT: [place]/AT, eyes anicteric.  ETT and OGT in place Chest: Coarse  breath sounds, no wheezes or rhonchi Heart: Regular rate and rhythm, no murmurs or gallops Abdomen: Soft, nontender, nondistended, bowel sounds present Skin: No rash  Neuro Mental Status Intubated and off sedation. Eyes will partially open to stimuli. Does not attempt to communicate. Not following any commands.  Opens eyes to sternal rub but appears aphasic and does not follow commands.  Cranial Nerves II: Inconsistent blink to threat.  Left greater than right sluggishly reactive pupils with asymmetry in the context of left sided cataract.   III,IV, VI: Weak doll's eye reflex. Eyes are conjugate and near the midline.  V,VII: Deferred VIII: No response to voice IX,X: Intubated with weak cough and gag XI: Head is midline XII: Unable to assess  Motor/Semsory: Flaccid tone x 4 is symmetric.  No spontaneous movements including no posturing.  Not following any commands.  Some fluctuate posturing to sternal rub. Movement of BLE and left upper extremity to  sternal rub.  ASSESSMENT/PLAN Ms. HEBE MERRIWETHER is a 76 y.o. female with history of PMHx of cervical cancer, DM2, diabetic retinopathy, HLD, HTN,. 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 On 9/22 she underwent MRI which showed multiple embolic/septic strokes. Complicating factors this admission have included enterococcal faecalis bacteremia and acute on chronic HFpEF.  Stroke Innumerable supra and infratentorial  acute infarcts, evolving right MCA/PCA infarct involving right temporal lobe, multiple central and peripherally positioned sites of micro-hemorrhages  Etiology:  Likely cardioembolic strong suspicion for endocarditis in the setting of bacteremia and mitral valve vegetation New onset atrial fibrillation which could have also contributed to her embolic strokes CT head Stable subacute right temporal and parietal lobe infarcts, numerous small cortical and subcortical infarcts  MRI  There are  innumerable supra and infratentorial acute infarcts. Given distribution and patient's history of cardiac arrest, these are likely secondary to a central embolic etiology. Redemonstrated is a an evolving right MCA/PCA territory infarct involving the right temporal lobe. there are multiple central and peripherally positioned sites of microhemorrhages in the bilateral cerebral and cerebellar hemispheres, as well as the pons. Many of the microhemorrhages are not in sites of infarcts EEG: (9/22): Continuous slow, generalized. This study is suggestive of moderate to severe diffuse encephalopathy, nonspecific etiology. No seizures or epileptiform discharges were seen throughout the recording. TEE: Large highly mobile vegetation on the anterior mitral leaflet with a severely hypertrophic and hyperdynamic left ventricle. . 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 Significant mitral insufficiency. 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 Prominent aortic arch atherosclerosis. Carotid Doppler bilateral 1-39% stenosis 2D Echo EF 65-70% with grade III diastolic dysfunction, LA severely dilated, RA midly dilated TEE large anterior mitral valve leaflet vegetation.  Mitral annulus calcification. LDL 115 HgbA1c 7.2 VTE prophylaxis - Heparin  aspirin 81 mg daily prior to admission, now on aspirin 81 mg daily and not on anticoagulation for new onset A-fib due to bleeding risk given bacterial endocarditis Therapy recommendations:  pending Disposition:  pending  Hx hypertension with intermittent hypotension requiring vasopressors Home meds:  Coreg '25mg'$  Stable- off vasopressors Permissive hypertension (OK if < 220/120) but gradually normalize in 5-7 days Long-term BP goal normotensive  Hyperlipidemia Home meds: Lipitor 80 mg , resumed in hospital LDL 115, goal < 70 Continue statin at  discharge  Diabetes type II Uncontrolled Home meds:  Glipizide HgbA1c 7.2, goal < 7.0 CBGs Recent Labs    02/07/22 0419 02/07/22 0855 02/07/22 1157  GLUCAP 151* 155* 123*    SSI  Other Stroke Risk Factors Advanced Age >/= 65  Obesity, Body mass index is 35.87 kg/m., BMI >/= 30 associated with increased stroke risk, recommend weight loss, diet and exercise as appropriate  Hx stroke/TIA  Other Active Problems S/p PEA cardiac arrest Acute hypoxic respiratory failure in the setting of cardiogenic pulmonary edema Required reintubation due to upper airway edema on 9/20 CCM primary Metabolic encephalopathy  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  Prognosis remains quite poor.  Patient is unlikely to survive without prolonged ventilatory support, tracheostomy, PEG tube and 24-hour nursing care.  Patient's family appears to be quite distraught and would like to continue to support those for now.  Patient also developed new onset atrial fibrillation which puts her at even more high risk for recurrent strokes but she is a very poor anticoagulation candidate given her bacterial endocarditis which puts her at very high risk for intracranial hemorrhage hence recommend holding anticoagulation for 2 weeks using aspirin alone instead.   .  Continue antibiotics as per ID Discussed with Dr. Tamala Julian critical care medicine.,This patient is critically  ill and at significant risk of neurological worsening, death and care requires constant monitoring of vital signs, hemodynamics,respiratory and cardiac monitoring, extensive review of multiple databases, frequent neurological assessment, discussion with family, other specialists and medical decision making of high complexity.I have made any additions or clarifications  directly to the above note.This critical care time does not reflect procedure time, or teaching time or supervisory time of PA/NP/Med Resident etc but could involve care discussion time.  I spent 30 minutes of neurocritical care time  in the care of  this patient.      Antony Contras, MD Medical Director [facility] Pager: [phone] 02/07/2022 3:24 PM   To contact Stroke Continuity provider, please refer to [email or web address] After hours, contact General Neurology

## 2022-02-08 ENCOUNTER — Inpatient Hospital Stay (HOSPITAL_COMMUNITY): Payer: Medicare Other

## 2022-02-08 DIAGNOSIS — R7881 Bacteremia: Secondary | ICD-10-CM | POA: Diagnosis not present

## 2022-02-08 DIAGNOSIS — I76 Septic arterial embolism: Secondary | ICD-10-CM | POA: Diagnosis not present

## 2022-02-08 DIAGNOSIS — B952 Enterococcus as the cause of diseases classified elsewhere: Secondary | ICD-10-CM | POA: Diagnosis not present

## 2022-02-08 DIAGNOSIS — I469 Cardiac arrest, cause unspecified: Secondary | ICD-10-CM | POA: Diagnosis not present

## 2022-02-08 DIAGNOSIS — I330 Acute and subacute infective endocarditis: Secondary | ICD-10-CM | POA: Diagnosis not present

## 2022-02-08 LAB — CBC WITH DIFFERENTIAL/PLATELET
Abs Immature Granulocytes: 1.2 10*3/uL — ABNORMAL HIGH (ref 0.00–0.07)
Basophils Absolute: 0.1 10*3/uL (ref 0.0–0.1)
Basophils Relative: 0 %
Eosinophils Absolute: 0.3 10*3/uL (ref 0.0–0.5)
Eosinophils Relative: 2 %
HCT: 24.2 % — ABNORMAL LOW (ref 36.0–46.0)
Hemoglobin: 7.7 g/dL — ABNORMAL LOW (ref 12.0–15.0)
Immature Granulocytes: 6 %
Lymphocytes Relative: 12 %
Lymphs Abs: 2.3 10*3/uL (ref 0.7–4.0)
MCH: 27.5 pg (ref 26.0–34.0)
MCHC: 31.8 g/dL (ref 30.0–36.0)
MCV: 86.4 fL (ref 80.0–100.0)
Monocytes Absolute: 1.1 10*3/uL — ABNORMAL HIGH (ref 0.1–1.0)
Monocytes Relative: 6 %
Neutro Abs: 14.3 10*3/uL — ABNORMAL HIGH (ref 1.7–7.7)
Neutrophils Relative %: 74 %
Platelets: 265 10*3/uL (ref 150–400)
RBC: 2.8 MIL/uL — ABNORMAL LOW (ref 3.87–5.11)
RDW: 20.7 % — ABNORMAL HIGH (ref 11.5–15.5)
WBC: 19.2 10*3/uL — ABNORMAL HIGH (ref 4.0–10.5)
nRBC: 3.8 % — ABNORMAL HIGH (ref 0.0–0.2)

## 2022-02-08 LAB — GLUCOSE, CAPILLARY
Glucose-Capillary: 114 mg/dL — ABNORMAL HIGH (ref 70–99)
Glucose-Capillary: 100 mg/dL — ABNORMAL HIGH (ref 70–99)
Glucose-Capillary: 161 mg/dL — ABNORMAL HIGH (ref 70–99)
Glucose-Capillary: 131 mg/dL — ABNORMAL HIGH (ref 70–99)
Glucose-Capillary: 135 mg/dL — ABNORMAL HIGH (ref 70–99)

## 2022-02-08 LAB — BASIC METABOLIC PANEL
Anion gap: 12 (ref 5–15)
BUN: 80 mg/dL — ABNORMAL HIGH (ref 8–23)
CO2: 24 mmol/L (ref 22–32)
Calcium: 9.3 mg/dL (ref 8.9–10.3)
Chloride: 114 mmol/L — ABNORMAL HIGH (ref 98–111)
Creatinine, Ser: 1.45 mg/dL — ABNORMAL HIGH (ref 0.44–1.00)
GFR, Estimated: 37 mL/min — ABNORMAL LOW (ref >60)
Glucose, Bld: 121 mg/dL — ABNORMAL HIGH (ref 70–99)
Potassium: 4.3 mmol/L (ref 3.5–5.1)
Sodium: 150 mmol/L — ABNORMAL HIGH (ref 135–145)

## 2022-02-08 LAB — PHOSPHORUS: Phosphorus: 5.4 mg/dL — ABNORMAL HIGH (ref 2.5–4.6)

## 2022-02-08 LAB — MAGNESIUM: Magnesium: 2.4 mg/dL (ref 1.7–2.4)

## 2022-02-08 LAB — PROCALCITONIN: Procalcitonin: 1.36 ng/mL

## 2022-02-08 MED ORDER — HYDROCERIN EX CREA
TOPICAL_CREAM | Freq: Two times a day (BID) | CUTANEOUS | Status: DC
  Administered 2022-02-12 – 2022-02-15 (×4): 1 via TOPICAL
  Filled 2022-02-08: qty 113

## 2022-02-08 MED ORDER — PROPOFOL 1000 MG/100ML IV EMUL
INTRAVENOUS | Status: AC
  Administered 2022-02-08: 10 ug/kg/min via INTRAVENOUS
  Filled 2022-02-08: qty 100

## 2022-02-08 MED ORDER — FREE WATER
200.0000 mL | Status: DC
  Administered 2022-02-08 – 2022-02-09 (×7): 200 mL

## 2022-02-08 MED ORDER — PROPOFOL 1000 MG/100ML IV EMUL
5.0000 ug/kg/min | INTRAVENOUS | Status: DC
  Administered 2022-02-08 – 2022-02-09 (×4): 50 ug/kg/min via INTRAVENOUS
  Administered 2022-02-09: 35 ug/kg/min via INTRAVENOUS
  Administered 2022-02-09: 60 ug/kg/min via INTRAVENOUS
  Administered 2022-02-09: 45 ug/kg/min via INTRAVENOUS
  Administered 2022-02-09 (×2): 60 ug/kg/min via INTRAVENOUS
  Administered 2022-02-10: 50 ug/kg/min via INTRAVENOUS
  Administered 2022-02-10 (×2): 45 ug/kg/min via INTRAVENOUS
  Administered 2022-02-10: 50 ug/kg/min via INTRAVENOUS
  Administered 2022-02-10 – 2022-02-11 (×5): 45 ug/kg/min via INTRAVENOUS
  Administered 2022-02-11: 35 ug/kg/min via INTRAVENOUS
  Administered 2022-02-11: 45 ug/kg/min via INTRAVENOUS
  Administered 2022-02-11: 40 ug/kg/min via INTRAVENOUS
  Administered 2022-02-12: 50 ug/kg/min via INTRAVENOUS
  Administered 2022-02-12: 45 ug/kg/min via INTRAVENOUS
  Administered 2022-02-12: 20 ug/kg/min via INTRAVENOUS
  Administered 2022-02-13 (×2): 50 ug/kg/min via INTRAVENOUS
  Administered 2022-02-13: 20 ug/kg/min via INTRAVENOUS
  Administered 2022-02-13: 40 ug/kg/min via INTRAVENOUS
  Administered 2022-02-13: 20.032 ug/kg/min via INTRAVENOUS
  Administered 2022-02-14 – 2022-02-15 (×3): 20 ug/kg/min via INTRAVENOUS
  Administered 2022-02-15: 10 ug/kg/min via INTRAVENOUS
  Administered 2022-02-16: 15 ug/kg/min via INTRAVENOUS
  Administered 2022-02-16: 30 ug/kg/min via INTRAVENOUS
  Administered 2022-02-16: 20 ug/kg/min via INTRAVENOUS
  Administered 2022-02-17 (×2): 15 ug/kg/min via INTRAVENOUS
  Administered 2022-02-18 (×3): 25 ug/kg/min via INTRAVENOUS
  Administered 2022-02-18: 30 ug/kg/min via INTRAVENOUS
  Administered 2022-02-19: 15 ug/kg/min via INTRAVENOUS
  Administered 2022-02-19 (×2): 30 ug/kg/min via INTRAVENOUS
  Filled 2022-02-08 (×34): qty 100
  Filled 2022-02-08: qty 200
  Filled 2022-02-08 (×8): qty 100

## 2022-02-08 MED ORDER — SODIUM CHLORIDE 0.9 % IV SOLN
2.0000 g | Freq: Four times a day (QID) | INTRAVENOUS | Status: DC
  Administered 2022-02-08 – 2022-02-11 (×13): 2 g via INTRAVENOUS
  Filled 2022-02-08 (×15): qty 2000

## 2022-02-08 NOTE — Discharge Summary (Addendum)
STROKE TEAM PROGRESS NOTE   INTERVAL HISTORY Patient went into atrial fibrillation overnight.  Neurological exam remains unchanged.  She remains intubated and sedated for respiratory failure. No major overnight events. Patient remains full code, no changes in [place] from family.  Her son is at the bedside.  He remains on amiodarone drip for rate control.  Vitals:   02/08/22 0900 02/08/22 1000 02/08/22 1100 02/08/22 1128  BP: (!) 152/95 139/81 (!) 148/90   Pulse: 91 85 95   Resp: (!) 27 (!) 23 (!) 27   Temp:    98.1 F (36.7 C)  TempSrc:    Oral  SpO2: 92% 90% 91%   Weight:      Height:       CBC:  Recent Labs  Lab 02/07/22 0415 02/08/22 0359  WBC 17.5* 19.2*  NEUTROABS 12.9* 14.3*  HGB 7.8* 7.7*  HCT 24.2* 24.2*  MCV 85.5 86.4  PLT 268 570   Basic Metabolic Panel:  Recent Labs  Lab 02/07/22 0415 02/08/22 0359  NA 146* 150*  K 4.6 4.3  CL 115* 114*  CO2 23 24  GLUCOSE 163* 121*  BUN 80* 80*  CREATININE 1.52* 1.45*  CALCIUM 8.9 9.3  MG 2.4 2.4  PHOS 5.2* 5.4*   Lipid Panel:  Recent Labs  Lab 02/06/22 0323  CHOL 119  TRIG 70  HDL 34*  CHOLHDL 3.5  VLDL 14  LDLCALC 71   HgbA1c:  No results for input(s): "HGBA1C" in the last 168 hours.  Urine Drug Screen:  No results for input(s): "LABOPIA", "COCAINSCRNUR", "LABBENZ", "AMPHETMU", "THCU", "LABBARB" in the last 168 hours.   Alcohol Level No results for input(s): "ETH" in the last 168 hours.  IMAGING past 24 hours DG Chest Port 1 View  Result Date: 02/08/2022 CLINICAL DATA:  Cardiac arrest. EXAM: PORTABLE CHEST 1 VIEW COMPARISON:  Chest x-ray dated 02/05/2022. FINDINGS: Endotracheal tube remains well positioned with tip above the level of the carina. Enteric tube passes below the diaphragm. RIGHT-sided central catheter in place with tip at the level of the upper SVC. Stable cardiomegaly. Persistent central pulmonary vascular congestion, but improved compared to the previous exam. No new lung findings. No  pneumothorax is seen. IMPRESSION: 1. Persistent central pulmonary vascular congestion, but improved compared to the previous exam. 2. Overall improved lung aeration compared to the chest x-ray of 02/05/2022, most likely related to decreased pulmonary edema. 3. Stable cardiomegaly. 4. Support apparatus appears stable in position. Electronically Signed   By: Franki Cabot M.D.   On: 02/08/2022 08:35    --PHYSICAL EXAM-- General: Critically ill-appearing obese female, orally intubated HEENT: [place]/AT, eyes anicteric.  ETT and OGT in place Chest: Coarse breath sounds, no wheezes or rhonchi Heart: Regular rate and rhythm, no murmurs or gallops Abdomen: Soft, nontender, nondistended, bowel sounds present Skin: No rash  Neuro Mental Status Intubated and off sedation. Eyes will partially open to stimuli. Does not attempt to communicate. Not following any commands.  Opens eyes to sternal rub but appears aphasic and does not follow commands.  Cranial Nerves II: Inconsistent blink to threat.  Left greater than right sluggishly reactive pupils with asymmetry in the context of left sided cataract.   III,IV, VI: Weak doll's eye reflex. Eyes are conjugate and near the midline.  V,VII: Deferred VIII: No response to voice IX,X: Intubated with weak cough and gag XI: Head is midline XII: Unable to assess  Motor/Semsory: Flaccid tone x 4 is symmetric.  No spontaneous movements including no  posturing.  Not following any commands.  Some fluctuate posturing to sternal rub. Movement of BLE and left upper extremity to  sternal rub.  ASSESSMENT/PLAN Alyssa Ingram is a 76 y.o. female with history of PMHx of cervical cancer, DM2, diabetic retinopathy, HLD, HTN,. 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 On 9/22 she underwent MRI which showed multiple embolic/septic strokes. Complicating factors this admission have included enterococcal faecalis bacteremia and acute on  chronic HFpEF.  Stroke Innumerable supra and infratentorial acute infarcts, evolving right MCA/PCA infarct involving right temporal lobe, multiple central and peripherally positioned sites of micro-hemorrhages  Etiology:  Likely cardioembolic strong suspicion for endocarditis in the setting of bacteremia and mitral valve vegetation New onset atrial fibrillation which could have also contributed to her embolic strokes CT head Stable subacute right temporal and parietal lobe infarcts, numerous small cortical and subcortical infarcts  MRI  There are innumerable supra and infratentorial acute infarcts. Given distribution and patient's history of cardiac arrest, these are likely secondary to a central embolic etiology. Redemonstrated is a an evolving right MCA/PCA territory infarct involving the right temporal lobe. there are multiple central and peripherally positioned sites of microhemorrhages in the bilateral cerebral and cerebellar hemispheres, as well as the pons. Many of the microhemorrhages are not in sites of infarcts EEG: (9/22): Continuous slow, generalized. This study is suggestive of moderate to severe diffuse encephalopathy, nonspecific etiology. No seizures or epileptiform discharges were seen throughout the recording. TEE: Large highly mobile vegetation on the anterior mitral leaflet with a severely hypertrophic and hyperdynamic left ventricle. . 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 Significant mitral insufficiency. 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 Prominent aortic arch atherosclerosis. Carotid Doppler bilateral 1-39% stenosis 2D Echo EF 65-70% with grade III diastolic dysfunction, LA severely dilated, RA midly dilated TEE large anterior mitral valve leaflet vegetation.  Mitral annulus calcification. LDL 115 HgbA1c 7.2 VTE prophylaxis - Heparin  aspirin 81  mg daily prior to admission, now on aspirin 81 mg daily and not on anticoagulation for new onset A-fib due to bleeding risk given bacterial endocarditis Therapy recommendations:  pending Disposition:  pending  Hx hypertension with intermittent hypotension requiring vasopressors Home meds:  Coreg '25mg'$  Stable- off vasopressors Permissive hypertension (OK if < 220/120) but gradually normalize in 5-7 days Long-term BP goal normotensive  Hyperlipidemia Home meds: Lipitor 80 mg , resumed in hospital LDL 115, goal < 70 Continue statin at discharge  Diabetes type II Uncontrolled Home meds:  Glipizide HgbA1c 7.2, goal < 7.0 CBGs Recent Labs    02/08/22 0351 02/08/22 0807 02/08/22 1130  GLUCAP 114* 100* 161*    SSI  Other Stroke Risk Factors Advanced Age >/= 65  Obesity, Body mass index is 35.82 kg/m., BMI >/= 30 associated with increased stroke risk, recommend weight loss, diet and exercise as appropriate  Hx stroke/TIA  Other Active Problems S/p PEA cardiac arrest Acute hypoxic respiratory failure in the setting of cardiogenic pulmonary edema Required reintubation due to upper airway edema on 9/20 CCM primary Metabolic encephalopathy  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  Family understands her poor prognosis but would  like to continue support for now.  Long discussion with son at the bedside and answered questions.  Discussed with critical care team and bedside RN This patient is critically ill and at significant risk of neurological worsening, death and care requires constant monitoring of vital signs, hemodynamics,respiratory and cardiac monitoring, extensive review of multiple databases, frequent  neurological assessment, discussion with family, other specialists and medical decision making of high complexity.I have made any additions or clarifications directly to the above note.This critical care time does not reflect procedure time, or teaching time or supervisory time of PA/NP/Med Resident etc but could involve care discussion time.  I spent 30 minutes of neurocritical care time  in the care of  this patient.    Alyssa Contras, MD To contact Stroke Continuity provider, please refer to [email or web address] After hours, contact General Neurology

## 2022-02-08 NOTE — Progress Notes (Signed)
Alyssa Ingram for Infectious Disease  Date of Admission:  01/15/2022           Reason for visit: Follow up on Enterococcus faecalis bacteremia and endocarditis  Current antibiotics: Ceftriaxone Ampicillin   ASSESSMENT:    76 y.o. 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  Repeat blood cultures negative as of 02/01/2022 with a PICC line that was placed on 01/29/2022.  TEE performed 9/25 found to have a large, highly mobile  vegetation on the mitral valve and MRI brain with cerebral infarcts consistent with septic emboli.  #Fever #Elevated WBC Likely multifactorial in the setting of metastatic Enterococcus faecalis infection.  Additionally, she has indwelling lines and tubes which may be a source of infection including a PICC line that was placed prior to documented clearance of her bacteremia.  She also had a residual large MV vegetation on TEE so the chance of further embolization is likely pretty high.  #Acute hypoxic respiratory failure  #Status post PEA cardiac arrest  #New onset atrial fibrillation with RVR  #Acute kidney injury Creatinine has been slowly improving.  #Diabetes Hemoglobin A1c 7.2.  RECOMMENDATIONS:    Continue ceftriaxone 2 g every 12 hours Continue ampicillin 2 g every 6 hours Agree with Foley catheter removal Follow-up chest x-ray although should be pretty well covered with current antibiotics Check repeat blood cultures if she becomes persistently febrile Ideally would be able to get PICC line removed as this was placed while she was still bacteremic Will follow   Principal Problem:   Enterococcal bacteremia Active Problems:   Cardiac arrest ([place])   Pressure injury of skin   Septic embolism ([facility])   Acute bacterial endocarditis    MEDICATIONS:    Scheduled Meds:  amLODipine  10 mg Per Tube Daily    Chlorhexidine Gluconate Cloth  6 each Topical Daily   free water  200 mL Per Tube Q4H   heparin  5,000 Units Subcutaneous Q8H   insulin aspart  0-15 Units Subcutaneous Q4H   insulin aspart  5 Units Subcutaneous Q4H   ipratropium-albuterol  3 mL Nebulization Q6H   lidocaine  2 patch Transdermal Q24H   multivitamin  15 mL Per Tube Daily   mouth rinse  15 mL Mouth Rinse Q2H   pantoprazole  40 mg Per Tube Daily   sodium chloride flush  10-40 mL Intracatheter Q12H   Continuous Infusions:  sodium chloride     amiodarone 30 mg/hr (02/08/22 0600)   ampicillin (OMNIPEN) IV 2 g (02/08/22 0902)   cefTRIAXone (ROCEPHIN)  IV 2 g (02/08/22 0916)   feeding supplement (VITAL AF 1.2 CAL) 55 mL/hr at 02/08/22 0600   PRN Meds:.acetaminophen, albuterol, docusate, fentaNYL (SUBLIMAZE) injection, midazolam, ondansetron (ZOFRAN) IV, mouth rinse, oxyCODONE, polyethylene glycol, polyvinyl alcohol, sodium chloride flush  SUBJECTIVE:   24 hour events:  No major events were noted overnight Patient was febrile this morning at 100.4 F WBC remains elevated up from 17.5 yesterday to 19.2 this morning CCM is planning to remove her Foley catheter She does maintain a PICC line at this time A repeat chest x-ray has been ordered Her creatinine is stable/improved Creatinine clearance is approximately 31.6 Last set of blood cultures was obtained 9/20 and are negative CCM has discussed long-term goals with family and that she will need trach and PEG.  Patient remains full code  No family is present this morning at  the bedside.   Review of Systems  Unable to perform ROS: Intubated      OBJECTIVE:   Blood pressure 137/86, pulse 83, temperature 99.3 F (37.4 C), resp. rate (!) 25, height 5' (1.524 m), weight 83.2 kg, SpO2 93 %. Body mass index is 35.82 kg/m.  Physical Exam Constitutional:      Comments: Critically ill appearing woman, lying in the ICU.  Agonal breathing.   HENT:     Head: Normocephalic  and atraumatic.     Mouth/Throat:     Comments: ET tube in place.  Abdominal:     General: There is no distension.     Palpations: Abdomen is soft.  Neurological:     Comments: INtubated, non responsive.       Lab Results: Lab Results  Component Value Date   WBC 19.2 (H) 02/08/2022   HGB 7.7 (L) 02/08/2022   HCT 24.2 (L) 02/08/2022   MCV 86.4 02/08/2022   PLT 265 02/08/2022    Lab Results  Component Value Date   NA 150 (H) 02/08/2022   K 4.3 02/08/2022   CO2 24 02/08/2022   GLUCOSE 121 (H) 02/08/2022   BUN 80 (H) 02/08/2022   CREATININE 1.45 (H) 02/08/2022   CALCIUM 9.3 02/08/2022   GFRNONAA 37 (L) 02/08/2022   GFRAA >60 12/09/2015    Lab Results  Component Value Date   ALT 41 02/02/2022   AST 24 02/02/2022   ALKPHOS 75 02/02/2022   BILITOT 0.8 02/02/2022       Component Value Date/Time   CRP <1.0 01/18/2021 1715       Component Value Date/Time   ESRSEDRATE 27 01/18/2021 1715     I have reviewed the micro and lab results in Epic.  Imaging: DG Chest Port 1 View  Result Date: 02/08/2022 CLINICAL DATA:  Cardiac arrest. EXAM: PORTABLE CHEST 1 VIEW COMPARISON:  Chest x-ray dated 02/05/2022. FINDINGS: Endotracheal tube remains well positioned with tip above the level of the carina. Enteric tube passes below the diaphragm. RIGHT-sided central catheter in place with tip at the level of the upper SVC. Stable cardiomegaly. Persistent central pulmonary vascular congestion, but improved compared to the previous exam. No new lung findings. No pneumothorax is seen. IMPRESSION: 1. Persistent central pulmonary vascular congestion, but improved compared to the previous exam. 2. Overall improved lung aeration compared to the chest x-ray of 02/05/2022, most likely related to decreased pulmonary edema. 3. Stable cardiomegaly. 4. Support apparatus appears stable in position. Electronically Signed   By: Franki Cabot M.D.   On: 02/08/2022 08:35   ECHO TEE  Result Date:  02/06/2022    TRANSESOPHOGEAL ECHO REPORT   Patient Name:   Alyssa Ingram Date of Exam: 02/06/2022 Medical Rec #:  [id number]        Height:       60.0 in Accession #:    [id number]       Weight:       187.4 lb Date of Birth:  28-May-1945        BSA:          1.816 m Patient Age:    49 years         BP:           132/79 mmHg Patient Gender: F                HR:           104 bpm. Exam Location:  Inpatient Procedure: 3D  Echo, Transesophageal Echo, Cardiac Doppler and Color Doppler Indications:     Bacteremia  History:         Patient has prior history of Echocardiogram examinations, most                  recent 01/27/2022. Cardiomyopathy and Hypertrophic                  Cardiomyopathy; Arrythmias:Cardiac Arrest.  Sonographer:     Roseanna Rainbow RDCS Referring Phys:  [id number] Kipp Brood Diagnosing Phys: Sanda Klein MD PROCEDURE: After discussion of the risks and benefits of a TEE, an informed consent was obtained from a family member. The transesophogeal probe was passed without difficulty through the esophogus of the patient. Imaged were obtained with the patient in a supine position. Sedation performed by performing physician. Patients was under conscious sedation during this procedure. Anesthetic administered: 21mg of Fentanyl, 4.'0mg'$  of Versed. The patient's vital signs; including heart rate, blood pressure, and oxygen saturation; remained stable throughout the procedure. The patient developed no complications during the procedure. IMPRESSIONS  1. Left ventricular ejection fraction, by estimation, is 70 to 75%. The left ventricle has hyperdynamic function. The left ventricle has no regional wall motion abnormalities. There is severe concentric left ventricular hypertrophy. Left ventricular diastolic function could not be evaluated.  2. Right ventricular systolic function is normal. The right ventricular size is normal. There is moderately elevated pulmonary artery systolic pressure. The estimated right ventricular  systolic pressure is 593.8mmHg.  3. Left atrial size was severely dilated. No left atrial/left atrial appendage thrombus was detected.  4. Right atrial size was mildly dilated.  5. There is at least one highly mobile echodensity attached to the middle (P2) scallop of the posterior leaflet, measuring up to 19 mm in length, consistent with a vegetation or large ruptured chorda(e). In addition, there may be at least one other mobile echodensity attached to the anterior mitral leaflet. There is systolic anterior motion of the mitral leaflet, but there is no significant LV outflow obstruction. The mitral valve is degenerative. Moderate to severe mitral valve regurgitation. Mild  mitral stenosis. The mean mitral valve gradient is 6.1 mmHg with average heart rate of 104 bpm. Moderate to severe mitral annular calcification.  6. There is a tiny mobile echodensity that probably represents a Lambl's excrescence. Calcificatis prominent on the noncoronary cusp. The aortic valve is normal in structure. There is mild calcification of the aortic valve. There is mild thickening of the aortic valve. Aortic valve regurgitation is not visualized. Aortic valve sclerosis/calcification is present, without any evidence of aortic stenosis.  7. There is Moderate (Grade III) protruding plaque involving the aortic arch. Comparison(s): Compared to the 01/13/2022 study, major findings are unchanged. Compared to the 06/19/2021 study, the mitral regurgitation is much more severe and the jet is now eccentric, directed anterolaterally. The jet direction is consistent with flail motion or perforation of the middle (P2) scallop of the posterior mitral leaflet. The echo findings do not suggest that the MR is due to systolic anterior motion on the mitral valve. This could represent primary chordal rupture or endocarditis (the  clinical scenario and the impression that there is more than one echodensity attached to the mitral valve supports a diagnosis  of endocarditis). FINDINGS  Left Ventricle: Left ventricular ejection fraction, by estimation, is 70 to 75%. The left ventricle has hyperdynamic function. The left ventricle has no regional wall motion abnormalities. The left ventricular internal cavity size was normal in  size. There is severe concentric left ventricular hypertrophy. Left ventricular diastolic function could not be evaluated due to atrial fibrillation. Left ventricular diastolic function could not be evaluated. Right Ventricle: The right ventricular size is normal. No increase in right ventricular wall thickness. Right ventricular systolic function is normal. There is moderately elevated pulmonary artery systolic pressure. The tricuspid regurgitant velocity is 3.34 m/s, and with an assumed right atrial pressure of 8 mmHg, the estimated right ventricular systolic pressure is 94.7 mmHg. Left Atrium: Left atrial size was severely dilated. No left atrial/left atrial appendage thrombus was detected. Right Atrium: Right atrial size was mildly dilated. Prominent Eustachian valve. Pericardium: There is no evidence of pericardial effusion. Mitral Valve: There is at least one highly mobile echodensity attached to the middle (P2) scallop of the posterior leaflet, measuring up to 19 mm in length, consistent with a vegetation or large ruptured chorda(e). In addition, there may be at least one other mobile echodensity attached to the anterior mitral leaflet. There is systolic anterior motion of the mitral leaflet, but there is no significant LV outflow obstruction. The mitral valve is degenerative in appearance. Moderate to severe mitral annular calcification. Moderate to severe mitral valve regurgitation, with eccentric posteriorly directed jet. Mild mitral valve stenosis. MV peak gradient, 13.1 mmHg. The mean mitral valve gradient is 6.1 mmHg with average heart rate of 104 bpm. Tricuspid Valve: The tricuspid valve is normal in structure. Tricuspid valve  regurgitation is trivial. Aortic Valve: There is a tiny mobile echodensity that probably represents a Lambl's excrescence. Calcificatis prominent on the noncoronary cusp. The aortic valve is normal in structure. There is mild calcification of the aortic valve. There is mild thickening of the aortic valve. Aortic valve regurgitation is not visualized. Aortic valve sclerosis/calcification is present, without any evidence of aortic stenosis. Pulmonic Valve: The pulmonic valve was grossly normal. Pulmonic valve regurgitation is not visualized. Aorta: The aortic root, ascending aorta, aortic arch and descending aorta are all structurally normal, with no evidence of dilitation or obstruction. There is moderate (Grade III) protruding plaque involving the aortic arch. Venous: A systolic blunting flow pattern is recorded from the left upper pulmonary vein and the right upper pulmonary vein. IAS/Shunts: No atrial level shunt detected by color flow Doppler.  MITRAL VALVE               TRICUSPID VALVE MV Area (PHT): 2.38 cm    TR Peak grad:   44.6 mmHg MV Peak grad:  13.1 mmHg   TR Vmax:        334.00 cm/s MV Mean grad:  6.1 mmHg MV Vmax:       1.81 m/s MV Vmean:      101.0 cm/s MV Decel Time: 319 msec Mihai Croitoru MD Electronically signed by Sanda Klein MD Signature Date/Time: 02/06/2022/4:54:14 PM    Final    VAS [place] TRANSCRANIAL DOPPLER  Result Date: 02/06/2022  Transcranial Doppler Patient Name:  Alyssa Ingram  Date of Exam:   02/06/2022 Medical Rec #: [id number]         Accession #:    [id number] Date of Birth: May 16, 1945         Patient Gender: F Patient Age:   41 years Exam Location:  [facility] Procedure:      VAS [place] TRANSCRANIAL DOPPLER Referring Phys: Janine Ores --------------------------------------------------------------------------------  Indications: Stroke. History: Hyperlipidemia, hyperteison, obesity, Typ II diabetes. Limitations: Age. poor windows Limitations for diagnostic windows: Unable  to insonate right transtemporal window. Performing  Technologist: Oda Cogan RDMS, RVT  Examination Guidelines: A complete evaluation includes B-mode imaging, spectral Doppler, color Doppler, and power Doppler as needed of all accessible portions of each vessel. Bilateral testing is considered an integral part of a complete examination. Limited examinations for reoccurring indications may be performed as noted.  +----------+-------------+----------+-----------+------------------+ RIGHT TCD Right VM (cm)Depth (cm)Pulsatility     Comment       +----------+-------------+----------+-----------+------------------+ MCA           24.00                         contralateral side +----------+-------------+----------+-----------+------------------+ ACA                                           not insonated    +----------+-------------+----------+-----------+------------------+ Term ICA                                      not insonated    +----------+-------------+----------+-----------+------------------+ PCA           17.00                                            +----------+-------------+----------+-----------+------------------+ Opthalmic     18.00                                            +----------+-------------+----------+-----------+------------------+ ICA siphon    40.00                                            +----------+-------------+----------+-----------+------------------+ Vertebral    -19.00                                            +----------+-------------+----------+-----------+------------------+  +----------+------------+----------+-----------+-------+ LEFT TCD  Left VM (cm)Depth (cm)PulsatilityComment +----------+------------+----------+-----------+-------+ MCA          22.00                                 +----------+------------+----------+-----------+-------+ ACA          -20.00                                 +----------+------------+----------+-----------+-------+ PCA          23.00                                 +----------+------------+----------+-----------+-------+ Opthalmic    18.00                                 +----------+------------+----------+-----------+-------+ ICA siphon   61.00                                 +----------+------------+----------+-----------+-------+  Vertebral    -18.00                                +----------+------------+----------+-----------+-------+  +------------+-----+-------------+             VM cm   Comment    +------------+-----+-------------+ Prox Basilar     not insonated +------------+-----+-------------+ Summary:  Poor acoustic windows throughout limit evaluation. Not all blood vessels could be insonated.antegrade flow noted in both middle cerebral, left anterior cerebral, bilateral posterior cerebral, opthalmics, carotid siphons and right vertebral arteries. *See table(s) above for TCD measurements and observations.  Diagnosing physician: Antony Contras MD Electronically signed by Antony Contras MD on 02/06/2022 at 3:36:34 PM.    Final    VAS US CAROTID  Result Date: 02/06/2022 Carotid Arterial Duplex Study Patient Name:  Alyssa Ingram  Date of Exam:   02/06/2022 Medical Rec #: [id number]         Accession #:    [id number] Date of Birth: 1946/04/29         Patient Gender: F Patient Age:   80 years Exam Location:  [facility] Procedure:      VAS US CAROTID Referring Phys: Janine Ores --------------------------------------------------------------------------------  Indications:      CVA. Risk Factors:     Hypertension, hyperlipidemia, Diabetes, coronary artery                   disease. Comparison Study: Carotid 2014 Performing Technologist: Oda Cogan RDMS, RVT  Examination Guidelines: A complete evaluation includes B-mode imaging, spectral Doppler, color Doppler, and power Doppler as needed of all accessible portions of each  vessel. Bilateral testing is considered an integral part of a complete examination. Limited examinations for reoccurring indications may be performed as noted.  Right Carotid Findings: +----------+--------+--------+--------+------------------+------------------+           PSV cm/sEDV cm/sStenosisPlaque DescriptionComments           +----------+--------+--------+--------+------------------+------------------+ CCA Prox  95      15                                                   +----------+--------+--------+--------+------------------+------------------+ CCA Distal49      10                                intimal thickening +----------+--------+--------+--------+------------------+------------------+ ICA Prox  31      11      1-39%                                        +----------+--------+--------+--------+------------------+------------------+ ICA Distal78      24                                intimal thickening +----------+--------+--------+--------+------------------+------------------+ ECA       55      9                                                    +----------+--------+--------+--------+------------------+------------------+ +----------+--------+-------+----------------+-------------------+  PSV cm/sEDV cmsDescribe        Arm Pressure (mmHG) +----------+--------+-------+----------------+-------------------+ [id number]            Multiphasic, WNL                    +----------+--------+-------+----------------+-------------------+ +---------+--------+--+--------+--+---------+ VertebralPSV cm/s41EDV cm/s17Antegrade +---------+--------+--+--------+--+---------+  Left Carotid Findings: +----------+--------+--------+--------+------------------+------------------+           PSV cm/sEDV cm/sStenosisPlaque DescriptionComments           +----------+--------+--------+--------+------------------+------------------+ CCA Prox  142      21                                                   +----------+--------+--------+--------+------------------+------------------+ CCA Distal65      13                                                   +----------+--------+--------+--------+------------------+------------------+ ICA Prox  38      17      1-39%                     intimal thickening +----------+--------+--------+--------+------------------+------------------+ ICA Mid   43      15                                                   +----------+--------+--------+--------+------------------+------------------+ ICA Distal60      24                                                   +----------+--------+--------+--------+------------------+------------------+ +----------+--------+--------+----------------+-------------------+           PSV cm/sEDV cm/sDescribe        Arm Pressure (mmHG) +----------+--------+--------+----------------+-------------------+ [id number]             Multiphasic, WNL                    +----------+--------+--------+----------------+-------------------+ +---------+--------+--+--------+--+---------+ VertebralPSV cm/s58EDV cm/s21Antegrade +---------+--------+--+--------+--+---------+   Summary: Right Carotid: Velocities in the right ICA are consistent with a 1-39% stenosis. Left Carotid: Velocities in the left ICA are consistent with a 1-39% stenosis. Vertebrals: Bilateral vertebral arteries demonstrate antegrade flow. *See table(s) above for measurements and observations.  Electronically signed by Antony Contras MD on 02/06/2022 at 3:34:38 PM.    Final    ECHO TEE  Result Date: 02/06/2022 Sanda Klein, MD     02/06/2022 12:36 PM INDICATIONS: endocarditis PROCEDURE: Informed consent was obtained prior to the procedure. The risks, benefits and alternatives for the procedure were discussed and the patient comprehended these risks.  Risks include, but are not limited to, cough,  sore throat, vomiting, nausea, somnolence, esophageal and stomach trauma or perforation, bleeding, low blood pressure, aspiration, pneumonia, infection, trauma to the teeth and death.  After a procedural time-out,  the patient was administered a total of Versed 4 mg and Fentanyl 100 mcg IV (patient on mechanical ventilation already).  The patient's heart rate, blood pressure,  and oxygen saturationweare monitored continuously during the procedure. The period of conscious sedation was 20 minutes, of which I was present face-to-face 100% of this time. The transesophageal probe was inserted in the esophagus and stomach without difficulty and multiple views were obtained.  The patient was kept under observation until the patient left the procedure room.  The patient left the procedure room in stable condition. Agitated microbubble saline contrast was not administered. COMPLICATIONS:  There were no immediate complications. FINDINGS: There is a large, highly mobile vegetation on the anterior mitral leaflet. 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 There is significant mitral insufficiency, probably moderate to severe, eccentric, anteriorly directed. The left ventricle is severely hypertrophic and hyperdynamic. There is aortic valve sclerosis without stenosis. 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 Prominent aortic arch atherosclerosis. RECOMMENDATIONS:   Treatment for endocarditis. Follow MR with TTE. Time Spent Directly with the Patient: 45 minutes Mihai Croitoru 02/06/2022, 12:31 PM     Imaging independently reviewed in Epic.    Raynelle Highland for Infectious Disease [facility] [phone] pager 02/08/2022, 9:55 AM

## 2022-02-08 NOTE — Progress Notes (Signed)
1415: Patient has not voided since foley cath removal at 0830. Bladder scanned for 79 cc urine with multiple bladder scanners. Notified Gerald Leitz NP. Reviewed lab work/kidney function and urine output when foley present. Orders to bladder scan at 1600.   1600: Bladder scan for 229 cc urine. Notified Gerald Leitz NP. Orders to continue to follow post foley removal protocol and if bladder scan reaches 450 cc without urination straight cath patient per foley removal protocol.

## 2022-02-08 NOTE — Progress Notes (Signed)
NAME:  Alyssa Ingram, MRN:  [id number], DOB:  May 29, 1945, LOS: 9 ADMISSION DATE:  02/05/2022, CONSULTATION DATE:  01/26/2022 REFERRING MD:  Leonette Monarch - EDP CHIEF COMPLAINT:  PEA arrest  History of Present Illness:  76 year old woman who presented to [facility] ED 9/18 post-PEA arrest. PMHx significant for HTN, HLD, HFpEF, HOCM, asthma, T2DM, GERD, obesity.   History is provided by son who lives with the patient.   She has been feeling poorly, including weakness and malaise for several days.  Chronic leg and foot pain was worse than usual.  This was followed by several days of abdominal discomfort, though no diarrhea or nausea or vomiting.  She was incontinent of stool once which is unusual for her.  9/17 she also developed neck pain.  She usually sleeps laying flat and was able to sleep Saturday night. Since yesterday, they have seen her leaning over the sink and counter bracing herself.    She was feeling worse, becoming weaker and they had decided to take her to the ED, when she started to get worse and more altered.  911 called, and she had completely lost consciousness at that time. When EMS arrived, breathing was agonal and she was pulseless. CPR x 22 min with ROSC, King airway in the field.    Intubated on arrival to ED. Neurologically, she was doing enough to fight the tube that she did receive doses of sedation in ED. Required pressor initiation (Levophed) and broad-spectrum antibiotic coverage with Vanc/Zosyn.  On 9/19, patient was noted to be waking up and following commands. She was extubated 9/20; shortly after extubation she required reintubation for upper airway edema. On 9/21, noted to be less responsive. Underwent MRI Brain 9/22 which showed multiple embolic/septic strokes, raising concern for endocarditis. TEE 9/25 demonstrating highly mobile valvular vegetation c/w endocarditis.  Pertinent Medical History:   Past Medical History:  Diagnosis Date   Abnormal MRI, spine 11/2011    ?discitis L5-S1, s/p CT bx 12/12/11   Allergic rhinitis, cause unspecified 01/31/2013   Arthritis    Asthma    Cervical cancer ([place])    Chronic diastolic heart failure (HCC)    Diabetic retinopathy (HCC)    GERD (gastroesophageal reflux disease)    H/O cardiovascular stress test    Nuclear study in 2011 normal perfusion   HOCM (hypertrophic obstructive cardiomyopathy) ([place])    Echo 12/19/11: Severe LVH, EF 55-65%, dynamic obstruction, wall motion normal, grade 1 diastolic dysfunction, systolic anterior motion of the mitral valve with mild MS and mild MR, mild LAE, PASP 34   Hyperlipidemia    Hypertension    Kidney stones    Obesity    Type II or unspecified type diabetes mellitus without mention of complication, uncontrolled 01/31/2013   Significant Hospital Events: Including procedures, antibiotic start and stop dates in addition to other pertinent events   9/18 Admitted to ICU 9/19 waking up, following commands.  Weaning vasopressors. 9/20 Diuresed, passed SBT and extubated, but reintubated for upper airway edema. 9/21 less responsive on no sedation. MRI, EEG ordered. 9/22  MRI which showed multiple embolic/septic strokes. 9/25 Marginally responsive. TEE completed with +highly mobile vegetation c/w endocarditis. 9/26 Grossly unchanged neurologic exam. Afib with RVR overnight, persistent Afib today with rates 110s. Amiodarone gtt and heparin gtt started (cleared with Neuro).  Interim History / Subjective:  No events, some vent dys-synchrony so given fentanyl. Another son is at bedside.  Objective:  Blood pressure 137/86, pulse 83, temperature (!) 100.4 F (38 C), resp.  rate (!) 25, height 5' (1.524 m), weight 83.2 kg, SpO2 93 %.    Vent Mode: CPAP;PSV FiO2 (%):  [40 %-50 %] 50 % Set Rate:  [26 bmp] 26 bmp Vt Set:  [360 mL] 360 mL PEEP:  [5 cmH20] 5 cmH20 Pressure Support:  [10 cmH20] 10 cmH20 Plateau Pressure:  [20 cmH20-24 cmH20] 24 cmH20   Intake/Output Summary (Last 24  hours) at 02/08/2022 0759 Last data filed at 02/08/2022 0600 Gross per 24 hour  Intake 2308.27 ml  Output 1515 ml  Net 793.27 ml    Filed Weights   02/06/22 0435 02/07/22 0431 02/08/22 0500  Weight: 85 kg 83.3 kg 83.2 kg   Physical Examination: Ill appearing woman on vent Minimal secretions Lungs with rhonci worse on R Heart sounds irregular, ext warm Does withdraw, maybe moves head to voice?, brainstem reflexes intact, triggers vent well, not responding to any commands  Cr improved Na creeping up, will add some FWF WBC up a  bit, unclear why Plts stable  Assessment & Plan:  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 Remove foley for leukocytosis. - Fent PRN vent synchrony - Mental status if it can improve will take weeks to months, she will need trach and PEG.   Discussed this with family yesterday.  There are other children who need to be involved in discussion so I asked that they all come in today so we can discuss disposition. They are not interested in changing [place] at this time  Best Practice: (right click and "Reselect all SmartList Selections" daily)   Diet/type: tube feeds DVT prophylaxis: prophylactic heparin  GI prophylaxis: PPI Lines: PICC Right Brachial 9/18  Foley:  Foley 9/18  Code Status:  full code Last date of multidisciplinary goals of care discussion  [9/26]  Critical care time:   The patient is critically ill with multiple organ system failure and requires high complexity decision making for assessment and support, frequent evaluation and titration of therapies, advanced monitoring, review of radiographic studies and interpretation of complex data.   Critical Care Time devoted to patient care services, exclusive of separately billable procedures, described in this note is 34 minutes.  Candee Furbish, MD [facility] 02/08/22 7:59 AM  Please see [email or web address] for pager details.  From 7A-7P if no response, please call [phone] After hours, please call ELink [phone]

## 2022-02-08 NOTE — TOC Progression Note (Signed)
Transition of Care [facility]) - Progression Note    Patient Details  Name: Alyssa Ingram MRN: [id number] Date of Birth: 1946/03/06  Transition of Care [facility]) CM/SW Contact  Graves-Bigelow, Ocie Cornfield, RN Phone Number: 02/08/2022, 2:07 PM  Clinical Narrative:  Patient was discussed in rounds today. Patient remains intubated. Case Manager was able to speak with sons Alyssa Ingram and Alyssa Ingram regarding possible LTAC vs SNF as the disposition plan. Case Manager explained the difference between the facilities. Family is leaning more towards LTAC from the conversation and we discussed [facility]. Family feels that once patient is stable to transition they feel that [facility] may be more appropriate since it's inside the hospital. Case Manager did explain that insurance will have to authorize an LTAC stay before transition. Family verbalized understanding and all questions were answered at the time of visit. Case Manager spoke with [facility] Liaison on 02-07-22 and recommendations are for trach/possible peg per insurance-family is aware. Staff RN discussed placing a palliative consult for Alyssa Ingram. Case Manager will continue to follow for transition of care needs.   Expected Discharge Plan: Long Term Acute Care (LTAC) Barriers to Discharge: Continued Medical Work up  Expected Discharge Plan and Services Expected Discharge Plan: [place] (LTAC)     Post Acute Care Choice: [place]    Readmission Risk Interventions    06/20/2021    9:52 AM  Readmission Risk Prevention Plan  Transportation Screening Complete  PCP or Specialist Appt within 5-7 Days Complete  Home Care Screening Complete  Medication Review (RN CM) Complete

## 2022-02-09 DIAGNOSIS — I76 Septic arterial embolism: Secondary | ICD-10-CM | POA: Diagnosis not present

## 2022-02-09 DIAGNOSIS — B952 Enterococcus as the cause of diseases classified elsewhere: Secondary | ICD-10-CM | POA: Diagnosis not present

## 2022-02-09 DIAGNOSIS — I330 Acute and subacute infective endocarditis: Secondary | ICD-10-CM | POA: Diagnosis not present

## 2022-02-09 DIAGNOSIS — R7881 Bacteremia: Secondary | ICD-10-CM | POA: Diagnosis not present

## 2022-02-09 LAB — CBC WITH DIFFERENTIAL/PLATELET
Abs Immature Granulocytes: 0.74 10*3/uL — ABNORMAL HIGH (ref 0.00–0.07)
Basophils Absolute: 0.1 10*3/uL (ref 0.0–0.1)
Basophils Relative: 0 %
Eosinophils Absolute: 0.4 10*3/uL (ref 0.0–0.5)
Eosinophils Relative: 3 %
HCT: 23.7 % — ABNORMAL LOW (ref 36.0–46.0)
Hemoglobin: 7.4 g/dL — ABNORMAL LOW (ref 12.0–15.0)
Immature Granulocytes: 4 %
Lymphocytes Relative: 10 %
Lymphs Abs: 1.6 10*3/uL (ref 0.7–4.0)
MCH: 27.6 pg (ref 26.0–34.0)
MCHC: 31.2 g/dL (ref 30.0–36.0)
MCV: 88.4 fL (ref 80.0–100.0)
Monocytes Absolute: 0.7 10*3/uL (ref 0.1–1.0)
Monocytes Relative: 4 %
Neutro Abs: 13.3 10*3/uL — ABNORMAL HIGH (ref 1.7–7.7)
Neutrophils Relative %: 79 %
Platelets: 180 10*3/uL (ref 150–400)
RBC: 2.68 MIL/uL — ABNORMAL LOW (ref 3.87–5.11)
RDW: 21.6 % — ABNORMAL HIGH (ref 11.5–15.5)
WBC: 16.9 10*3/uL — ABNORMAL HIGH (ref 4.0–10.5)
nRBC: 2.4 % — ABNORMAL HIGH (ref 0.0–0.2)

## 2022-02-09 LAB — BASIC METABOLIC PANEL
Anion gap: 14 (ref 5–15)
BUN: 82 mg/dL — ABNORMAL HIGH (ref 8–23)
CO2: 23 mmol/L (ref 22–32)
Calcium: 9.2 mg/dL (ref 8.9–10.3)
Chloride: 113 mmol/L — ABNORMAL HIGH (ref 98–111)
Creatinine, Ser: 1.42 mg/dL — ABNORMAL HIGH (ref 0.44–1.00)
GFR, Estimated: 38 mL/min — ABNORMAL LOW (ref >60)
Glucose, Bld: 123 mg/dL — ABNORMAL HIGH (ref 70–99)
Potassium: 3.9 mmol/L (ref 3.5–5.1)
Sodium: 150 mmol/L — ABNORMAL HIGH (ref 135–145)

## 2022-02-09 LAB — GLUCOSE, CAPILLARY
Glucose-Capillary: 106 mg/dL — ABNORMAL HIGH (ref 70–99)
Glucose-Capillary: 109 mg/dL — ABNORMAL HIGH (ref 70–99)
Glucose-Capillary: 118 mg/dL — ABNORMAL HIGH (ref 70–99)
Glucose-Capillary: 132 mg/dL — ABNORMAL HIGH (ref 70–99)
Glucose-Capillary: 140 mg/dL — ABNORMAL HIGH (ref 70–99)
Glucose-Capillary: 141 mg/dL — ABNORMAL HIGH (ref 70–99)

## 2022-02-09 LAB — PHOSPHORUS: Phosphorus: 5.1 mg/dL — ABNORMAL HIGH (ref 2.5–4.6)

## 2022-02-09 LAB — TRIGLYCERIDES: Triglycerides: 117 mg/dL (ref <150)

## 2022-02-09 LAB — MAGNESIUM: Magnesium: 2.3 mg/dL (ref 1.7–2.4)

## 2022-02-09 MED ORDER — FREE WATER
400.0000 mL | Status: DC
  Administered 2022-02-09 – 2022-02-11 (×13): 400 mL

## 2022-02-09 MED ORDER — OXYCODONE HCL 5 MG PO TABS
5.0000 mg | ORAL_TABLET | Freq: Four times a day (QID) | ORAL | Status: DC
  Administered 2022-02-09 – 2022-02-11 (×10): 5 mg
  Filled 2022-02-09 (×10): qty 1

## 2022-02-09 NOTE — Progress Notes (Signed)
PHARMACY CONSULT NOTE FOR:  OUTPATIENT  PARENTERAL ANTIBIOTIC THERAPY (OPAT)  Indication: infective endocarditis Regimen:  Ampicillin 6 g IV q6h  Ceftriaxone 2g IV q12h End date: 03/16/2022  Patient likely going to [facility] upon discharge.   Thank you for allowing pharmacy to be a part of this patient's care.  Eliseo Gum, PharmD PGY1 Pharmacy Resident   02/09/2022  3:38 PM

## 2022-02-09 NOTE — Progress Notes (Signed)
[facility] for Infectious Disease  Date of Admission:  02/08/2022           Reason for visit: Follow up on Enterococcus faecalis bacteremia and endocarditis  Current antibiotics: Ceftriaxone Ampicillin  ASSESSMENT:    76 y.o. female admitted with:  #Enterococcal bacteremia #Mitral valve endocarditis #CNS septic emboli Patient with Enterococcus faecalis bacteremia with positive blood cultures 02/11/2022.  Repeat blood cultures negative as of 02/01/2022.  Has a PICC line that was placed on 01/22/2022.  TEE performed 9/25 in the setting of multiple cerebral infarcts consistent with septic emboli showed a large, highly mobile vegetation on the mitral valve.  #Acute hypoxic respiratory failure #Status post PEA cardiac arrest #Septic emboli to the brain Remains intubated and likely will require tracheostomy and PEG tube as unlikely to have any meaningful recovery at this time.  #Atrial fibrillation with RVR  #Acute kidney injury Creatinine has improved and today is stable at 1.42.  RECOMMENDATIONS:    Continue ceftriaxone 2 g every 12 hours Continue ampicillin 2 g every 6 hours Ideally would be able to have PICC line exchanged for a new central line as this current one was placed prior to clearance of her bacteremia Overall prognosis for meaningful recovery seems unlikely and family has agreed to proceed with trach/PEG with anticipating discharge to [facility] when appropriate Recommend 6 weeks of antibiotics from 02/01/2022 through 03/15/2022 Will sign off, please give [place] a call with any questions or concerns   Principal Problem:   Enterococcal bacteremia Active Problems:   Cardiac arrest ([place])   Pressure injury of skin   Septic embolism ([place])   Acute bacterial endocarditis    MEDICATIONS:    Scheduled Meds:  amLODipine  10 mg Per Tube Daily   Chlorhexidine Gluconate Cloth  6 each Topical Daily   free water  400 mL Per Tube Q4H   heparin  5,000 Units Subcutaneous  Q8H   hydrocerin   Topical BID   insulin aspart  0-15 Units Subcutaneous Q4H   insulin aspart  5 Units Subcutaneous Q4H   ipratropium-albuterol  3 mL Nebulization Q6H   lidocaine  2 patch Transdermal Q24H   multivitamin  15 mL Per Tube Daily   mouth rinse  15 mL Mouth Rinse Q2H   oxyCODONE  5 mg Per Tube Q6H   pantoprazole  40 mg Per Tube Daily   sodium chloride flush  10-40 mL Intracatheter Q12H   Continuous Infusions:  sodium chloride     amiodarone 30 mg/hr (02/09/22 0700)   ampicillin (OMNIPEN) IV 2 g (02/09/22 0818)   cefTRIAXone (ROCEPHIN)  IV 2 g (02/09/22 1003)   feeding supplement (VITAL AF 1.2 CAL) 55 mL/hr at 02/09/22 0700   propofol (DIPRIVAN) infusion 45 mcg/kg/min (02/09/22 0819)   PRN Meds:.acetaminophen, albuterol, docusate, fentaNYL (SUBLIMAZE) injection, midazolam, ondansetron (ZOFRAN) IV, mouth rinse, polyethylene glycol, polyvinyl alcohol, sodium chloride flush  SUBJECTIVE:   24 hour events:  No major events were noted overnight  Patient remains intubated and nonresponsive in the ICU.  Her son is at the bedside this morning.  He reiterates there plans for trach/PEG and are hopeful for a miracle.  Review of Systems  Unable to perform ROS: Intubated      OBJECTIVE:   Blood pressure 125/67, pulse (!) 59, temperature 98.1 F (36.7 C), temperature source Axillary, resp. rate (!) 30, height 5' (1.524 m), weight 88 kg, SpO2 94 %. Body mass index is 37.89 kg/m.  Physical Exam Constitutional:  Comments: Ill-appearing woman, lying in the ICU, not responsive  HENT:     Head: Normocephalic and atraumatic.  Pulmonary:     Comments: She is intubated with ventilated breath sounds and symmetric chest rise and fall. Abdominal:     General: There is no distension.     Palpations: Abdomen is soft.  Musculoskeletal:     Cervical back: Normal range of motion and neck supple.     Comments: PICC line in place  Skin:    General: Skin is warm and dry.       Lab Results: Lab Results  Component Value Date   WBC 16.9 (H) 02/09/2022   HGB 7.4 (L) 02/09/2022   HCT 23.7 (L) 02/09/2022   MCV 88.4 02/09/2022   PLT 180 02/09/2022    Lab Results  Component Value Date   NA 150 (H) 02/09/2022   K 3.9 02/09/2022   CO2 23 02/09/2022   GLUCOSE 123 (H) 02/09/2022   BUN 82 (H) 02/09/2022   CREATININE 1.42 (H) 02/09/2022   CALCIUM 9.2 02/09/2022   GFRNONAA 38 (L) 02/09/2022   GFRAA >60 12/09/2015    Lab Results  Component Value Date   ALT 41 02/02/2022   AST 24 02/02/2022   ALKPHOS 75 02/02/2022   BILITOT 0.8 02/02/2022       Component Value Date/Time   CRP <1.0 01/18/2021 1715       Component Value Date/Time   ESRSEDRATE 27 01/18/2021 1715     I have reviewed the micro and lab results in Epic.  Imaging: DG Chest Port 1 View  Result Date: 02/08/2022 CLINICAL DATA:  Cardiac arrest. EXAM: PORTABLE CHEST 1 VIEW COMPARISON:  Chest x-ray dated 02/05/2022. FINDINGS: Endotracheal tube remains well positioned with tip above the level of the carina. Enteric tube passes below the diaphragm. RIGHT-sided central catheter in place with tip at the level of the upper SVC. Stable cardiomegaly. Persistent central pulmonary vascular congestion, but improved compared to the previous exam. No new lung findings. No pneumothorax is seen. IMPRESSION: 1. Persistent central pulmonary vascular congestion, but improved compared to the previous exam. 2. Overall improved lung aeration compared to the chest x-ray of 02/05/2022, most likely related to decreased pulmonary edema. 3. Stable cardiomegaly. 4. Support apparatus appears stable in position. Electronically Signed   By: Franki Cabot M.D.   On: 02/08/2022 08:35     Imaging independently reviewed in Epic.    Raynelle Highland for Infectious Disease [facility] [phone] pager 02/09/2022, 10:31 AM  I have personally spent 50 minutes involved in face-to-face and  non-face-to-face activities for this patient on the day of the visit. Professional time spent includes the following activities: Preparing to see the patient (review of tests), Obtaining and/or reviewing separately obtained history (admission/discharge record), Performing a medically appropriate examination and/or evaluation , Ordering medications/tests/procedures, referring and communicating with other health care professionals, Documenting clinical information in the EMR, Independently interpreting results (not separately reported), Communicating results to the patient/family/caregiver, Counseling and educating the patient/family/caregiver and Care coordination (not separately reported).

## 2022-02-09 NOTE — TOC Progression Note (Signed)
Transition of Care [facility]) - Progression Note    Patient Details  Name: Alyssa Ingram MRN: [id number] Date of Birth: 1945-06-16  Transition of Care [facility]) CM/SW Contact  Graves-Bigelow, Ocie Cornfield, RN Phone Number: 02/09/2022, 1:22 PM  Clinical Narrative:  Case Manager and family had a brief discussion at the bedside regarding LTAC vs SNF. [facility] is leaning [facility] once trach/possible peg placed. Case Manager answered most questions and family was appreciative of the time. Case Manager did reach out to the [facility] and she will follow the patient and answer family's questions as needed. [facility] Idol is tentatively planned for 02-13-22.  Case Manager will continue to follow for transition of care needs.     Expected Discharge Plan: Long Term Acute Care (LTAC) Barriers to Discharge: Continued Medical Work up  Expected Discharge Plan and Services Expected Discharge Plan: [place] (LTAC)     Post Acute Care Choice: [place]      Readmission Risk Interventions    06/20/2021    9:52 AM  Readmission Risk Prevention Plan  Transportation Screening Complete  PCP or Specialist Appt within 5-7 Days Complete  Home Care Screening Complete  Medication Review (RN CM) Complete

## 2022-02-09 NOTE — Progress Notes (Signed)
NAME:  Alyssa Ingram, MRN:  [id number], DOB:  [birth year], LOS: 69 ADMISSION DATE:  01/20/2022, CONSULTATION DATE:  02/01/2022 REFERRING MD:  Leonette Monarch - EDP CHIEF COMPLAINT:  PEA arrest  History of Present Illness:  76 year old woman who presented to [facility] ED 9/18 post-PEA arrest. PMHx significant for HTN, HLD, HFpEF, HOCM, asthma, T2DM, GERD, obesity.   History is provided by son who lives with the patient.   She has been feeling poorly, including weakness and malaise for several days.  Chronic leg and foot pain was worse than usual.  This was followed by several days of abdominal discomfort, though no diarrhea or nausea or vomiting.  She was incontinent of stool once which is unusual for her.  9/17 she also developed neck pain.  She usually sleeps laying flat and was able to sleep Saturday night. Since yesterday, they have seen her leaning over the sink and counter bracing herself.    She was feeling worse, becoming weaker and they had decided to take her to the ED, when she started to get worse and more altered.  911 called, and she had completely lost consciousness at that time. When EMS arrived, breathing was agonal and she was pulseless. CPR x 22 min with ROSC, King airway in the field.    Intubated on arrival to ED. Neurologically, she was doing enough to fight the tube that she did receive doses of sedation in ED. Required pressor initiation (Levophed) and broad-spectrum antibiotic coverage with Vanc/Zosyn.  On 9/19, patient was noted to be waking up and following commands. She was extubated 9/20; shortly after extubation she required reintubation for upper airway edema. On 9/21, noted to be less responsive. Underwent MRI Brain 9/22 which showed multiple embolic/septic strokes, raising concern for endocarditis. TEE 9/25 demonstrating highly mobile valvular vegetation c/w endocarditis.  Pertinent Medical History:   Past Medical History:  Diagnosis Date   Abnormal MRI, spine 11/2011    ?discitis L5-S1, s/p CT bx 12/12/11   Allergic rhinitis, cause unspecified 01/31/2013   Arthritis    Asthma    Cervical cancer ([place])    Chronic diastolic heart failure (HCC)    Diabetic retinopathy (HCC)    GERD (gastroesophageal reflux disease)    H/O cardiovascular stress test    Nuclear study in 2011 normal perfusion   HOCM (hypertrophic obstructive cardiomyopathy) ([place])    Echo 12/19/11: Severe LVH, EF 55-65%, dynamic obstruction, wall motion normal, grade 1 diastolic dysfunction, systolic anterior motion of the mitral valve with mild MS and mild MR, mild LAE, PASP 34   Hyperlipidemia    Hypertension    Kidney stones    Obesity    Type II or unspecified type diabetes mellitus without mention of complication, uncontrolled 01/31/2013   Significant Hospital Events: Including procedures, antibiotic start and stop dates in addition to other pertinent events   9/18 Admitted to ICU 9/19 waking up, following commands.  Weaning vasopressors. 9/20 Diuresed, passed SBT and extubated, but reintubated for upper airway edema. 9/21 less responsive on no sedation. MRI, EEG ordered. 9/22  MRI which showed multiple embolic/septic strokes. 9/25 Marginally responsive. TEE completed with +highly mobile vegetation c/w endocarditis. 9/26 Grossly unchanged neurologic exam. [place] conversation started with family 9/27 ongoing family discussions  Interim History / Subjective:  No events, uncomfortable appearing with lessening sedation. Seems to respond better to fentanyl. Family at bedside.  Objective:  Blood pressure 125/67, pulse (!) 59, temperature 98.1 F (36.7 C), temperature source Axillary, resp. rate (!) 30, height  5' (1.524 m), weight 88 kg, SpO2 94 %.    Vent Mode: PRVC FiO2 (%):  [40 %-50 %] 40 % Set Rate:  [26 bmp] 26 bmp Vt Set:  [360 mL] 360 mL PEEP:  [5 cmH20] 5 cmH20 Pressure Support:  [10 cmH20] 10 cmH20 Plateau Pressure:  [23 cmH20-26 cmH20] 25 cmH20   Intake/Output Summary (Last 24  hours) at 02/09/2022 1021 Last data filed at 02/09/2022 0900 Gross per 24 hour  Intake 2543.98 ml  Output 1300 ml  Net 1243.98 ml    Filed Weights   02/07/22 0431 02/08/22 0500 02/09/22 0500  Weight: 83.3 kg 83.2 kg 88 kg   Physical Examination: Ill appearing woman on vent Minimal secretions Lungs with rhonci worse on R stable Heart sounds irregular, ext warm +accessory muscle use with propofol lightening Does withdraw, maybe moves head to voice, brainstem reflexes intact, triggers vent well, not responding to any commands  Cr stable Na still high, will increase FWF WBC better Plts okay Stable anemia  Assessment & Plan:  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  Foley is out Anemia- stable DM2 with Hyperglycemia- stable Hx GERD, HTN, asthma, arthritis  - Continue vent support, mental status, diagnosis preclude safe extubation trial - Continue prolonged course of antibiotics as outlined by ID - Start standing oxycodone as she seems to respond better to pain medicine than sedation, can use PRNs on top - Family to decide on trach/PEG within next 24-48 hours - Family would like an MRI before patient goes to [facility] - PMT to see patient to help manage expections  Best Practice: (right click and "Reselect all SmartList Selections" daily)   Diet/type: tube feeds DVT prophylaxis: prophylactic heparin  GI prophylaxis: PPI Lines: PICC Right Brachial 9/18   Foley:  Foley 9/18  Code Status:  full code Last date of multidisciplinary goals of care discussion [9/26]  Critical care time:   The patient is critically ill with multiple organ system failure and requires high complexity decision making for assessment and support, frequent evaluation and titration of therapies, advanced monitoring, review of radiographic studies and interpretation of complex data.   Critical Care Time devoted to patient care services, exclusive of separately billable procedures, described in this note is 37 minutes.  Candee Furbish, MD [place] 02/09/22 10:21 AM  Please see [email or web address] for pager details.  From 7A-7P if no response, please call [phone] After hours, please call ELink [phone]

## 2022-02-09 NOTE — Progress Notes (Signed)
Patient son and daughter at bedside to discuss patient placement to SNF vs LTACH with case management. Agreeable to trach procedure and expresses wishes to proceed with procedure. Dr. Tamala Julian made aware that patient family would like to consent. Procedure explained and questions answered by Dr. Tamala Julian. Consent form signed by MD, family and RN (witness). Plan for trach on Monday, patient family wishes to be present prior to the procedure.

## 2022-02-09 NOTE — Consult Note (Signed)
   [facility] Inpatient Consult   02/09/2022  Alyssa Ingram [birth year] [id number]   [place] Organization [ACO] Patient: [employer] Medicare   Primary Care Provider: [name], MD [place] [facility] is a provider with a Care Coordination team and program and is listed for the [facility] follow up needs    Patient screened for length of stay hospitalization with noted high risk score for unplanned readmission risk at the time of this sceeening  Reviewed to assess for post hospital transition for readmission prevention needs.  Review of patient's medical record reveals patient is currently being recommended for and inpatient [facility] team is working towards a LTACH level of care facility with insurance approval and ongoing medical/surgical management. Patient is currently intubated with eeds for possible PEG/Trach.    Plan:  Continue to follow progress and disposition to assess for post hospital care management needs during hospital stay.     For questions contact:    Natividad Brood, RN BSN [place]  [phone] business mobile phone Toll free office [phone]  *[place]  [phone] Fax number: [phone] [email or web address]

## 2022-02-10 DIAGNOSIS — Z7189 Other specified counseling: Secondary | ICD-10-CM | POA: Diagnosis not present

## 2022-02-10 DIAGNOSIS — Z515 Encounter for palliative care: Secondary | ICD-10-CM | POA: Diagnosis not present

## 2022-02-10 DIAGNOSIS — R7881 Bacteremia: Secondary | ICD-10-CM | POA: Diagnosis not present

## 2022-02-10 DIAGNOSIS — I330 Acute and subacute infective endocarditis: Secondary | ICD-10-CM | POA: Diagnosis not present

## 2022-02-10 DIAGNOSIS — J9601 Acute respiratory failure with hypoxia: Secondary | ICD-10-CM

## 2022-02-10 DIAGNOSIS — G9340 Encephalopathy, unspecified: Secondary | ICD-10-CM

## 2022-02-10 DIAGNOSIS — Z9911 Dependence on respirator [ventilator] status: Secondary | ICD-10-CM | POA: Diagnosis not present

## 2022-02-10 LAB — CBC WITH DIFFERENTIAL/PLATELET
Abs Immature Granulocytes: 0 10*3/uL (ref 0.00–0.07)
Basophils Absolute: 0 10*3/uL (ref 0.0–0.1)
Basophils Relative: 0 %
Eosinophils Absolute: 0.6 10*3/uL — ABNORMAL HIGH (ref 0.0–0.5)
Eosinophils Relative: 3 %
HCT: 24.3 % — ABNORMAL LOW (ref 36.0–46.0)
Hemoglobin: 7.8 g/dL — ABNORMAL LOW (ref 12.0–15.0)
Lymphocytes Relative: 11 %
Lymphs Abs: 2.2 10*3/uL (ref 0.7–4.0)
MCH: 28.7 pg (ref 26.0–34.0)
MCHC: 32.1 g/dL (ref 30.0–36.0)
MCV: 89.3 fL (ref 80.0–100.0)
Monocytes Absolute: 1 10*3/uL (ref 0.1–1.0)
Monocytes Relative: 5 %
Neutro Abs: 16.4 10*3/uL — ABNORMAL HIGH (ref 1.7–7.7)
Neutrophils Relative %: 81 %
Platelets: 116 10*3/uL — ABNORMAL LOW (ref 150–400)
RBC: 2.72 MIL/uL — ABNORMAL LOW (ref 3.87–5.11)
RDW: 22.9 % — ABNORMAL HIGH (ref 11.5–15.5)
WBC: 20.2 10*3/uL — ABNORMAL HIGH (ref 4.0–10.5)
nRBC: 4.4 % — ABNORMAL HIGH (ref 0.0–0.2)

## 2022-02-10 LAB — BASIC METABOLIC PANEL
Anion gap: 12 (ref 5–15)
BUN: 86 mg/dL — ABNORMAL HIGH (ref 8–23)
CO2: 23 mmol/L (ref 22–32)
Calcium: 8.8 mg/dL — ABNORMAL LOW (ref 8.9–10.3)
Chloride: 108 mmol/L (ref 98–111)
Creatinine, Ser: 1.57 mg/dL — ABNORMAL HIGH (ref 0.44–1.00)
GFR, Estimated: 34 mL/min — ABNORMAL LOW (ref >60)
Glucose, Bld: 100 mg/dL — ABNORMAL HIGH (ref 70–99)
Potassium: 4 mmol/L (ref 3.5–5.1)
Sodium: 143 mmol/L (ref 135–145)

## 2022-02-10 LAB — GLUCOSE, CAPILLARY
Glucose-Capillary: 107 mg/dL — ABNORMAL HIGH (ref 70–99)
Glucose-Capillary: 114 mg/dL — ABNORMAL HIGH (ref 70–99)
Glucose-Capillary: 135 mg/dL — ABNORMAL HIGH (ref 70–99)
Glucose-Capillary: 67 mg/dL — ABNORMAL LOW (ref 70–99)
Glucose-Capillary: 87 mg/dL (ref 70–99)
Glucose-Capillary: 97 mg/dL (ref 70–99)

## 2022-02-10 LAB — PHOSPHORUS: Phosphorus: 6.3 mg/dL — ABNORMAL HIGH (ref 2.5–4.6)

## 2022-02-10 LAB — MAGNESIUM: Magnesium: 2.3 mg/dL (ref 1.7–2.4)

## 2022-02-10 MED ORDER — METOPROLOL TARTRATE 12.5 MG HALF TABLET: 12.5000 mg | ORAL_TABLET | Freq: Two times a day (BID) | ORAL | Status: DC

## 2022-02-10 MED ORDER — FUROSEMIDE 10 MG/ML IJ SOLN
40.0000 mg | Freq: Once | INTRAMUSCULAR | Status: AC
  Administered 2022-02-10: 40 mg via INTRAVENOUS
  Filled 2022-02-10: qty 4

## 2022-02-10 NOTE — Progress Notes (Signed)
   02/10/22 2150  Clinical Encounter Type  Visited With Family  Visit Type Spiritual support;Follow-up;Critical Care  Referral From Chaplain (Rounding)  Consult/Referral To Chaplain Melvenia Beam)  Recommendations Follow-up Family Visit  Spiritual Encounters  Spiritual Needs Emotional;Grief support   While rounding this Chaplain stopped by room for family visit. Met with Alyssa Ingram's daughter, Alyssa Ingram. Chaplain provided meaningful presence, reflective listening, emotional and spiritual support. [name] shared her [name] Spiritual beliefs and discussed her family of origin and her adult children and grandchildren.  [address] [place], [place]oot., [phone]

## 2022-02-10 NOTE — Progress Notes (Signed)
eLink Physician-Brief Progress Note Patient Name: Alyssa Ingram DOB: 1945-09-22 MRN: [id number]   Date of Service  02/10/2022  HPI/Events of Note  Urinary retention - Bladder scan with > 500 mL.   eICU Interventions  Plan: I/O Cath PRN.      Intervention Category Major Interventions: Other:  Lysle Dingwall 02/10/2022, 5:39 AM

## 2022-02-10 NOTE — Progress Notes (Signed)
NAME:  Alyssa Ingram, MRN:  [id number], DOB:  10-Dec-1945, LOS: 89 ADMISSION DATE:  02/02/2022, CONSULTATION DATE:  01/27/2022 REFERRING MD:  Leonette Monarch - EDP CHIEF COMPLAINT:  PEA arrest  History of Present Illness:  76 year old woman who presented to [facility] ED 9/18 post-PEA arrest. PMHx significant for HTN, HLD, HFpEF, HOCM, asthma, T2DM, GERD, obesity.   History is provided by son who lives with the patient.   She has been feeling poorly, including weakness and malaise for several days.  Chronic leg and foot pain was worse than usual.  This was followed by several days of abdominal discomfort, though no diarrhea or nausea or vomiting.  She was incontinent of stool once which is unusual for her.  9/17 she also developed neck pain.  She usually sleeps laying flat and was able to sleep Saturday night. Since yesterday, they have seen her leaning over the sink and counter bracing herself.    She was feeling worse, becoming weaker and they had decided to take her to the ED, when she started to get worse and more altered.  911 called, and she had completely lost consciousness at that time. When EMS arrived, breathing was agonal and she was pulseless. CPR x 22 min with ROSC, King airway in the field.    Intubated on arrival to ED. Neurologically, she was doing enough to fight the tube that she did receive doses of sedation in ED. Required pressor initiation (Levophed) and broad-spectrum antibiotic coverage with Vanc/Zosyn.  On 9/19, patient was noted to be waking up and following commands. She was extubated 9/20; shortly after extubation she required reintubation for upper airway edema. On 9/21, noted to be less responsive. Underwent MRI Brain 9/22 which showed multiple embolic/septic strokes, raising concern for endocarditis. TEE 9/25 demonstrating highly mobile valvular vegetation c/w endocarditis.  Pertinent Medical History:   Past Medical History:  Diagnosis Date   Abnormal MRI, spine 11/2011    ?discitis L5-S1, s/p CT bx 12/12/11   Allergic rhinitis, cause unspecified 01/31/2013   Arthritis    Asthma    Cervical cancer ([place])    Chronic diastolic heart failure (HCC)    Diabetic retinopathy (HCC)    GERD (gastroesophageal reflux disease)    H/O cardiovascular stress test    Nuclear study in 2011 normal perfusion   HOCM (hypertrophic obstructive cardiomyopathy) ([place])    Echo 12/19/11: Severe LVH, EF 55-65%, dynamic obstruction, wall motion normal, grade 1 diastolic dysfunction, systolic anterior motion of the mitral valve with mild MS and mild MR, mild LAE, PASP 34   Hyperlipidemia    Hypertension    Kidney stones    Obesity    Type II or unspecified type diabetes mellitus without mention of complication, uncontrolled 01/31/2013   Significant Hospital Events: Including procedures, antibiotic start and stop dates in addition to other pertinent events   9/18 Admitted to ICU 9/19 waking up, following commands.  Weaning vasopressors. 9/20 Diuresed, passed SBT and extubated, but reintubated for upper airway edema. 9/21 less responsive on no sedation. MRI, EEG ordered. 9/22  MRI which showed multiple embolic/septic strokes. 9/25 Marginally responsive. TEE completed with +highly mobile vegetation c/w endocarditis. 9/26 Grossly unchanged neurologic exam. [place] conversation started with family 9/27 ongoing family discussions  Interim History / Subjective:  No acute events overnight Failed SBT, significant respiratory distress by vent with SAT  Objective:  Blood pressure 115/66, pulse (!) 50, temperature 98.5 F (36.9 C), temperature source Axillary, resp. rate (!) 27, height 5' (1.524 m),  weight 88 kg, SpO2 93 %.    Vent Mode: PRVC FiO2 (%):  [40 %-70 %] 60 % Set Rate:  [26 bmp] 26 bmp Vt Set:  [360 mL] 360 mL PEEP:  [5 cmH20-10 cmH20] 10 cmH20 Plateau Pressure:  [21 cmH20-30 cmH20] 21 cmH20   Intake/Output Summary (Last 24 hours) at 02/10/2022 0850 Last data filed at 02/10/2022  0600 Gross per 24 hour  Intake 3522.07 ml  Output 1425 ml  Net 2097.07 ml    Filed Weights   02/07/22 0431 02/08/22 0500 02/09/22 0500  Weight: 83.3 kg 83.2 kg 88 kg   Physical Examination: General:  elderly appearing female on the vent Neuro:  Sedated 45 propofol HEENT:  [place]/AT, Pupils 5 mm and sluggish, no JVD Cardiovascular:  borderline brady, regular, no MRG Lungs:  Clear bilateral breath sounds Abdomen:  Soft, non-distended, non-tender.  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   Afib - AC on hold for 2 weeks given inflammatory milieu in CNS and high risk for hemorrhagic conversion - DC amiodarone for bradycardia, not a home med - Can add back home BB if rate becomes an issue now that shock has resolved.   Prolonged OHCA- but was talking following commands prior to showering of emboli above to brain - Supportive care  AKI - trend BMP  Encephalopathy- more related to septic cerebral infarcts above. Was awake and extubated after cardiac arrest. - Propofol for RASS goal -1 to -2 - Scheduled oxycodone - Seems to respond better to pain management - Family would like to see additional MRI prior to [facility] transfer.   Respiratory failure- recurrent due to inability to handle secretions from CNS insults above - Full vent support - Intermittent CXR - Daily WUA, SBT - Family deciding re: trach. Family meeting today with Palliative.  Rising white count-   no obvious new source.   Foley is out - Trend WBC and fever curve - Continue antibiotics as above  Anemia- stable  DM2 with Hyperglycemia- stable - SSI - DC TF coverage as she has been low on the past few checks. It is being held.   Hx GERD, HTN, asthma, arthritis - Continue vent support, mental status, diagnosis preclude safe extubation trial  Mixed shock- resolved  HOCM- not issue at present    Best Practice: (right click and "Reselect all SmartList Selections" daily)   Diet/type: tube feeds DVT prophylaxis: prophylactic heparin  GI prophylaxis: PPI Lines: PICC Right Brachial 9/18  Foley:  Foley 9/18  Code Status:  full code Last date of multidisciplinary goals of care discussion [9/26]  Critical care time: 48 minutes    Georgann Housekeeper, AGACNP-BC [place] for personal pager PCCM on call pager [phone] until 7pm. Please call Elink 7p-7a. [phone]  02/10/2022 9:21 AM

## 2022-02-10 NOTE — Consult Note (Addendum)
Palliative Medicine Inpatient Consult Note  Consulting Provider: Candee Furbish, MD  Reason for consult:   [place] Palliative Medicine Consult  Reason for Consult? Goals of Care   02/10/2022  HPI:  Per intake H&P --> 76 year old woman who presented to [facility] ED 9/18 post-PEA arrest. PMHx significant for HTN, HLD, HFpEF, HOCM, asthma, T2DM, GERD, obesity. Patient s/p arrest and endured CPR x22 minutes. S/P two intubations. Patient has suffered multiple embolic/septic strokes, in the setting of endocarditis. Palliative care has been asked to get involved to further assist in goals of care conversations.   Clinical Assessment/Goals of Care:  *Please note that this is a verbal dictation therefore any spelling or grammatical errors are due to the "[place]" system interpretation.  I have reviewed medical records including EPIC notes, labs and imaging, received report from bedside RN, assessed the patient.    I called patients children: [name] and Sherlyn Hay to further discuss diagnosis prognosis, GOC, EOL wishes, disposition and options.   I introduced Palliative Medicine as specialized medical care for people living with serious illness. It focuses on providing relief from the symptoms and stress of a serious illness. The goal is to improve quality of life for both the patient and the family.  Medical History Review and Understanding:  Reviewed patient's history of heart failure, asthma, type 2 diabetes, GERD, hypertension.  Social History:  Birth is from [place], [place] originally.  She moved at a young age to [place], [place].  She has been married twice and had 6 children though one of her sons passed away on 07/20/22 due to complications of [id number].  She has 1 child, Audry Pili who she has been helping to raise from his youth.  She has 11 grandchildren and 4 great-grandchildren.  She is identified as an extremely intelligent woman who formally  worked in Engineer, mining.  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  She formerly was a very active member of [employer].  She would help with out reach activities such as meals and various gatherings.  She is identified very much as a type of woman who would "give you the short off her back".  Functional and Nutritional State:  Prior to hospitalization Lux had been living with her son [name].  She was able to attend to most BADL's and mobilized with a walker.  [name] shares that sometimes she would be tired and have difficulty taking her pajamas and Pampers off though otherwise she was quite independent.  Teyonna had a good appetite leading up to hospitalization.  Advance Directives:  A detailed discussion was had today regarding advanced directives.  Patient does not have advanced directives though her surrogate decision makers to date have been her son [name] and daughter Ashby Dawes.  We discussed potentially completing these if patient should become more coherent.  Code Status:  Concepts specific to code status, artifical feeding and hydration, continued IV antibiotics and rehospitalization was had.  The difference between a aggressive medical intervention path  and a palliative comfort care path for this patient at this time was had.   Patient's son and daughter are clear about the desire for full code and full scope of treatment.  Discussion:  We reviewed patients present health state in the setting of her recent stroke and what this may look like in the long-term.  Patient's family are clear about the goals for improvement and for the patient to be able to "hug her  great-grandchildren".  We did discuss the idea of a tracheostomy and gastrostomy tube.  I shared that being that patient does not have a surrogate decision maker formally it is essential that we communicate with all family members to ensure we are all on the same page moving forward with care objectives.   Patient's son, [name] shares that he has spoken to his siblings and they are all in agreement with a tracheostomy.  He does vocalize that he did not discuss a gastrostomy tube though this is something we can do moving forward.  [name] is in agreement to have a formal family meeting tomorrow though the time is to be decided.  Discussed the importance of continued conversation with family and their  medical providers regarding overall plan of care and treatment options, ensuring decisions are within the context of the patients values and GOCs.  Decision Maker: [name] (Son): [phone] (Mobile)  SUMMARY OF RECOMMENDATIONS   Full Code/Full Scope of Care  Plan for family meeting tomorrow to further address tracheostomy and gastrostomy tube placement --> Time will be decided this afternoon family need to talk amongst themselves  I have reached out to patient primary MD, Dr. Jenny Reichmann as family confides in him to provide his opinion on patients situation  Ongoing Palliative support  Code Status/Advance Care Planning: FULL CODE  Palliative Prophylaxis:  Aspiration, Bowel Regimen, Delirium Protocol, Frequent Pain Assessment, Oral Care, Palliative Wound Care, and Turn Reposition  Additional Recommendations (Limitations, Scope, Preferences): Continue current care  Psycho-social/Spiritual:  Desire for further Chaplaincy support: Not presently Additional Recommendations: Reviewed patient clinical state and goals for the future   Prognosis: Worrisome given patients underlying endocarditis, reintubation, and infarcts  Discharge Planning: Likely to an LTACH  Vitals:   02/10/22 0500 02/10/22 0600  BP: 118/63 112/72  Pulse: (!) 53 (!) 48  Resp: (!) 26 (!) 26  Temp: 97.9 F (36.6 C)   SpO2: 90% (!) 86%    Intake/Output Summary (Last 24 hours) at 02/10/2022 9675 Last data filed at 02/10/2022 0600 Gross per 24 hour  Intake 3809.06 ml  Output 1425 ml  Net 2384.06 ml   Last Weight  Most  recent update: 02/09/2022  5:06 AM    Weight  88 kg (194 lb 0.1 oz)            Gen:  Critically ill appearing Elderly AA F in NAD HEENT: ETT, dry mucous membranes CV: Irregular rate and rhythm  PULM: Mechanical ventilator ABD: soft/nontender  EXT: Generalized edema  Neuro: Sedated  PPS: 10%   This conversation/these recommendations were discussed with patient primary care team, Dr. Carlis Abbott  Billing based on MDM: High  Problems Addressed: One acute or chronic illness or injury that poses a threat to life or bodily function  Amount and/or Complexity of Data: Category 3:Discussion of management or test interpretation with external physician/other qualified health care professional/appropriate source (not separately reported)  Risks: Decision regarding elective major surgery with identified patient or procedure risk factors, Decision regarding hospitalization or escalation of hospital care, and Decision not to resuscitate or to de-escalate care because of poor prognosis ______________________________________________________ [place] Team Team Cell Phone: [phone] Please utilize secure chat with additional questions, if there is no response within 30 minutes please call the above phone number  Palliative Medicine Team providers are available by phone from 7am to 7pm daily and can be reached through the team cell phone.  Should this patient require assistance outside of these hours, please  call the patient's attending physician.

## 2022-02-11 ENCOUNTER — Inpatient Hospital Stay (HOSPITAL_COMMUNITY): Payer: Medicare Other

## 2022-02-11 DIAGNOSIS — Z7189 Other specified counseling: Secondary | ICD-10-CM

## 2022-02-11 DIAGNOSIS — J9601 Acute respiratory failure with hypoxia: Secondary | ICD-10-CM | POA: Diagnosis not present

## 2022-02-11 DIAGNOSIS — N179 Acute kidney failure, unspecified: Secondary | ICD-10-CM

## 2022-02-11 DIAGNOSIS — Z9911 Dependence on respirator [ventilator] status: Secondary | ICD-10-CM | POA: Diagnosis not present

## 2022-02-11 DIAGNOSIS — I059 Rheumatic mitral valve disease, unspecified: Secondary | ICD-10-CM

## 2022-02-11 DIAGNOSIS — R7881 Bacteremia: Secondary | ICD-10-CM | POA: Diagnosis not present

## 2022-02-11 DIAGNOSIS — Z515 Encounter for palliative care: Secondary | ICD-10-CM | POA: Diagnosis not present

## 2022-02-11 LAB — BASIC METABOLIC PANEL
Anion gap: 19 — ABNORMAL HIGH (ref 5–15)
Anion gap: 15 (ref 5–15)
BUN: 90 mg/dL — ABNORMAL HIGH (ref 8–23)
BUN: 100 mg/dL — ABNORMAL HIGH (ref 8–23)
CO2: 19 mmol/L — ABNORMAL LOW (ref 22–32)
CO2: 18 mmol/L — ABNORMAL LOW (ref 22–32)
Calcium: 8.5 mg/dL — ABNORMAL LOW (ref 8.9–10.3)
Calcium: 7.7 mg/dL — ABNORMAL LOW (ref 8.9–10.3)
Chloride: 96 mmol/L — ABNORMAL LOW (ref 98–111)
Chloride: 102 mmol/L (ref 98–111)
Creatinine, Ser: 1.82 mg/dL — ABNORMAL HIGH (ref 0.44–1.00)
Creatinine, Ser: 2.27 mg/dL — ABNORMAL HIGH (ref 0.44–1.00)
GFR, Estimated: 28 mL/min — ABNORMAL LOW (ref >60)
GFR, Estimated: 22 mL/min — ABNORMAL LOW (ref >60)
Glucose, Bld: 150 mg/dL — ABNORMAL HIGH (ref 70–99)
Glucose, Bld: 144 mg/dL — ABNORMAL HIGH (ref 70–99)
Potassium: 4.4 mmol/L (ref 3.5–5.1)
Potassium: 4.9 mmol/L (ref 3.5–5.1)
Sodium: 134 mmol/L — ABNORMAL LOW (ref 135–145)
Sodium: 135 mmol/L (ref 135–145)

## 2022-02-11 LAB — GLUCOSE, CAPILLARY
Glucose-Capillary: 105 mg/dL — ABNORMAL HIGH (ref 70–99)
Glucose-Capillary: 108 mg/dL — ABNORMAL HIGH (ref 70–99)
Glucose-Capillary: 125 mg/dL — ABNORMAL HIGH (ref 70–99)
Glucose-Capillary: 139 mg/dL — ABNORMAL HIGH (ref 70–99)
Glucose-Capillary: 146 mg/dL — ABNORMAL HIGH (ref 70–99)
Glucose-Capillary: 155 mg/dL — ABNORMAL HIGH (ref 70–99)
Glucose-Capillary: 156 mg/dL — ABNORMAL HIGH (ref 70–99)

## 2022-02-11 LAB — CBC WITH DIFFERENTIAL/PLATELET
Abs Immature Granulocytes: 1.03 10*3/uL — ABNORMAL HIGH (ref 0.00–0.07)
Basophils Absolute: 0.1 10*3/uL (ref 0.0–0.1)
Basophils Relative: 0 %
Eosinophils Absolute: 0.3 10*3/uL (ref 0.0–0.5)
Eosinophils Relative: 2 %
HCT: 22.3 % — ABNORMAL LOW (ref 36.0–46.0)
Hemoglobin: 7.3 g/dL — ABNORMAL LOW (ref 12.0–15.0)
Immature Granulocytes: 5 %
Lymphocytes Relative: 9 %
Lymphs Abs: 1.7 10*3/uL (ref 0.7–4.0)
MCH: 29.4 pg (ref 26.0–34.0)
MCHC: 32.7 g/dL (ref 30.0–36.0)
MCV: 89.9 fL (ref 80.0–100.0)
Monocytes Absolute: 0.8 10*3/uL (ref 0.1–1.0)
Monocytes Relative: 4 %
Neutro Abs: 15.9 10*3/uL — ABNORMAL HIGH (ref 1.7–7.7)
Neutrophils Relative %: 80 %
Platelets: 59 10*3/uL — ABNORMAL LOW (ref 150–400)
RBC: 2.48 MIL/uL — ABNORMAL LOW (ref 3.87–5.11)
RDW: 24 % — ABNORMAL HIGH (ref 11.5–15.5)
WBC: 19.8 10*3/uL — ABNORMAL HIGH (ref 4.0–10.5)
nRBC: 6.3 % — ABNORMAL HIGH (ref 0.0–0.2)

## 2022-02-11 LAB — PATHOLOGIST SMEAR REVIEW

## 2022-02-11 LAB — PHOSPHORUS: Phosphorus: 7.5 mg/dL — ABNORMAL HIGH (ref 2.5–4.6)

## 2022-02-11 LAB — MAGNESIUM: Magnesium: 2.2 mg/dL (ref 1.7–2.4)

## 2022-02-11 MED ORDER — SODIUM CHLORIDE 0.9 % IV SOLN
2.0000 g | Freq: Three times a day (TID) | INTRAVENOUS | Status: DC
  Administered 2022-02-11 – 2022-02-19 (×25): 2 g via INTRAVENOUS
  Filled 2022-02-11 (×26): qty 2000

## 2022-02-11 MED ORDER — FREE WATER: 200.0000 mL | Status: DC

## 2022-02-11 MED ORDER — ALBUMIN HUMAN 25 % IV SOLN
50.0000 g | Freq: Once | INTRAVENOUS | Status: AC
  Administered 2022-02-11: 50 g via INTRAVENOUS
  Filled 2022-02-11: qty 200

## 2022-02-11 MED ORDER — SODIUM BICARBONATE 650 MG PO TABS
650.0000 mg | ORAL_TABLET | Freq: Two times a day (BID) | ORAL | Status: DC
  Administered 2022-02-11 – 2022-02-13 (×4): 650 mg
  Filled 2022-02-11 (×4): qty 1

## 2022-02-11 MED ORDER — FUROSEMIDE 10 MG/ML IJ SOLN
40.0000 mg | Freq: Once | INTRAMUSCULAR | Status: AC
  Administered 2022-02-11: 40 mg via INTRAVENOUS
  Filled 2022-02-11: qty 4

## 2022-02-11 MED ORDER — AMLODIPINE BESYLATE 5 MG PO TABS
5.0000 mg | ORAL_TABLET | Freq: Every day | ORAL | Status: DC
  Administered 2022-02-12: 5 mg
  Filled 2022-02-11: qty 1

## 2022-02-11 MED ORDER — FREE WATER
200.0000 mL | Freq: Three times a day (TID) | Status: DC
  Administered 2022-02-11 – 2022-02-14 (×7): 200 mL

## 2022-02-11 MED ORDER — SODIUM ZIRCONIUM CYCLOSILICATE 10 G PO PACK
10.0000 g | PACK | Freq: Every day | ORAL | Status: AC
  Administered 2022-02-11 – 2022-02-12 (×2): 10 g
  Filled 2022-02-11 (×2): qty 1

## 2022-02-11 NOTE — Progress Notes (Signed)
eLink Physician-Brief Progress Note Patient Name: Alyssa Ingram DOB: Aug 01, 1945 MRN: [id number]   Date of Service  02/11/2022  HPI/Events of Note  Oliguria - Bladder scan with 60 mL. CVP = 20. BP = 108/59 with MAP = 74.  eICU Interventions  Plan: Lasix 40 mg IV X 1.     Intervention Category Major Interventions: Other:  [name] Cornelia Copa 02/11/2022, 3:15 AM

## 2022-02-11 NOTE — Progress Notes (Addendum)
NAME:  Alyssa Ingram, MRN:  [id number], DOB:  Sep 18, 1945, LOS: 35 ADMISSION DATE:  02/11/2022, CONSULTATION DATE:  01/13/2022 REFERRING MD:  Leonette Monarch - EDP CHIEF COMPLAINT:  PEA arrest  History of Present Illness:  76 year old woman who presented to [facility] ED 9/18 post-PEA arrest. PMHx significant for HTN, HLD, HFpEF, HOCM, asthma, T2DM, GERD, obesity.   History is provided by son who lives with the patient.   She has been feeling poorly, including weakness and malaise for several days.  Chronic leg and foot pain was worse than usual.  This was followed by several days of abdominal discomfort, though no diarrhea or nausea or vomiting.  She was incontinent of stool once which is unusual for her.  9/17 she also developed neck pain.  She usually sleeps laying flat and was able to sleep Saturday night. Since yesterday, they have seen her leaning over the sink and counter bracing herself.    She was feeling worse, becoming weaker and they had decided to take her to the ED, when she started to get worse and more altered.  911 called, and she had completely lost consciousness at that time. When EMS arrived, breathing was agonal and she was pulseless. CPR x 22 min with ROSC, King airway in the field.    Intubated on arrival to ED. Neurologically, she was doing enough to fight the tube that she did receive doses of sedation in ED. Required pressor initiation (Levophed) and broad-spectrum antibiotic coverage with Vanc/Zosyn.  On 9/19, patient was noted to be waking up and following commands. She was extubated 9/20; shortly after extubation she required reintubation for upper airway edema. On 9/21, noted to be less responsive. Underwent MRI Brain 9/22 which showed multiple embolic/septic strokes, raising concern for endocarditis. TEE 9/25 demonstrating highly mobile valvular vegetation on MV c/w endocarditis.  Pertinent Medical History:   Past Medical History:  Diagnosis Date   Abnormal MRI, spine 11/2011    ?discitis L5-S1, s/p CT bx 12/12/11   Allergic rhinitis, cause unspecified 01/31/2013   Arthritis    Asthma    Cervical cancer ([place])    Chronic diastolic heart failure (HCC)    Diabetic retinopathy (HCC)    GERD (gastroesophageal reflux disease)    H/O cardiovascular stress test    Nuclear study in 2011 normal perfusion   HOCM (hypertrophic obstructive cardiomyopathy) ([place])    Echo 12/19/11: Severe LVH, EF 55-65%, dynamic obstruction, wall motion normal, grade 1 diastolic dysfunction, systolic anterior motion of the mitral valve with mild MS and mild MR, mild LAE, PASP 34   Hyperlipidemia    Hypertension    Kidney stones    Obesity    Type II or unspecified type diabetes mellitus without mention of complication, uncontrolled 01/31/2013   Significant Hospital Events: Including procedures, antibiotic start and stop dates in addition to other pertinent events   9/18 Admitted to ICU 9/19 waking up, following commands.  Weaning vasopressors. 9/20 Diuresed, passed SBT and extubated, but reintubated for upper airway edema. 9/21 less responsive on no sedation. MRI, EEG ordered. 9/22  MRI which showed multiple embolic/septic strokes. 9/25 Marginally responsive. TEE completed with +highly mobile MV vegetation c/w endocarditis. 9/26 Grossly unchanged neurologic exam. [place] conversation started with family 9/27 ongoing family discussions 9/28 failed SBT 2/2 significant resp distress, amio stopped given bradycardia  Interim History / Subjective:   Oliguria overnight, s/p lasix  Became tachypneic with some rapid eye movements when propofol held Family meeting with PMT scheduled this afternoon at  4pm  Objective:  Blood pressure 108/61, pulse (!) 48, temperature 97.6 F (36.4 C), temperature source Oral, resp. rate (!) 26, height 5' (1.524 m), weight 93.3 kg, SpO2 96 %.    Vent Mode: PRVC FiO2 (%):  [50 %-70 %] 50 % Set Rate:  [26 bmp] 26 bmp Vt Set:  [360 mL] 360 mL PEEP:  [10 cmH20] 10  cmH20 Plateau Pressure:  [20 cmH20-23 cmH20] 23 cmH20   Intake/Output Summary (Last 24 hours) at 02/11/2022 1047 Last data filed at 02/11/2022 0600 Gross per 24 hour  Intake 2155.59 ml  Output 700 ml  Net 1455.59 ml   Filed Weights   02/08/22 0500 02/09/22 0500 02/11/22 0500  Weight: 83.2 kg 88 kg 93.3 kg   Physical Examination: Propofol at 25 General:  critically ill appearing elderly female on MV in NAD HEENT: MM pink/moist, pupils 4/sluggish, intact corneals, ETT, left cortrak Neuro: no response to noxious stimuli in extremities, does not open eyes or follow commands CV: SB in the 50s, no murmur PULM:  dyssynchronous/ tachypneic with MV at times with propofol at 25, resolved with higher rates, lungs clear, deep cough, scant secretions GI: obese, soft, +bs, purewick, flexi  Extremities: warm/dry, diffuse anasarca  Skin: no rashes  Labs reviewed> BMET reviewed> question how accurate given several jumps, like Na 143> 134, bicarb 23> 19.  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  May be limited given anasarca   Afib - AC on hold for 2 weeks given inflammatory milieu in CNS and high risk for hemorrhagic conversion - remains bradycardic in 40-50's despite stopping amio 9/29.  Will hold lopressor too.  Monitor.  BP stable  HTN - cont norvasc.  BP stable   Prolonged OHCA- but was talking following commands prior to showering of emboli above to brain - Supportive care  AKI - repeat BMET now to verify several labs fluctuations  - s/p lasix 40 yest and overnight with ongoing oliguria.  Would benefit from more diuresis given her  anasarca but need to monitor renal function closely.   - bladder scans thus far with 60, then 120 but will scan again and I/O to ensure no retention which may be related to sCr bump.  - strict I/Os/ trend renal indices - renal dose meds/ avoid nephrotoxins  Encephalopathy- more related to septic cerebral infarcts above. Was awake and extubated after cardiac arrest. - minimize propofol as able - cont scheduled oxycodone '5mg'$  q6hr with scheduled bowel regimen - Seems to respond better to pain management - Family would like [facility] see additional [facility]   Respiratory failure- recurrent due to inability to handle secretions from CNS insults above - cont full MV support.  SBT trials unsuccessful thus far due to resp distress/ tachypnea.  Will cont to attempt weaning trials.  Mental status remains barrier to extubation - ongoing family discussions for GOC/ trach with PMT - Intermittent CXR - PPI/ VAP - BD, aggressive pulm hygiene   Rising white count-   no obvious new source.  Foley is out 9/27 - WBC stable, afebrile - Continue antibiotics as above -  ideally needs line holiday if we can obtain PIV's  Thrombocytopenia - plts 180> 116> 59.  Hgb stable.  No signs of bleeding.  Check HIT ab for completeness, timing does not fit.  Unclear etiology> meds vs sepsis vs r/o HIT.  Smear yest did not show schistocytes   Anemia- stable, monitor closely, transfuse for Hgb < 7  Hyponatremia - decrease FWF for now now.  Rechecking BMET  DM2 with Hyperglycemia- stable - SSI mod/ CBG q4hrs  Mixed shock- resolved  HOCM- not issue at present  GOC> appreciate PMT assistance.  Ongoing discussions with family concerning GOC's.  Meeting planned today around 1600.    Best Practice: (right click and "Reselect all SmartList Selections" daily)   Diet/type: tube feeds DVT prophylaxis: prophylactic heparin  GI prophylaxis: PPI Lines: PICC Right Brachial 9/18  Foley:  Foley 9/18 out 9/27>  purwick Code Status:  full code Last date of multidisciplinary goals of care discussion [9/26]   Critical care time: 48 minutes    Kennieth Rad, MSN, AG-ACNP-BC [facility] 02/11/2022, 10:47 AM  See Amion for pager If no response to pager, please call PCCM consult pager After 7:00 pm call Elink

## 2022-02-11 NOTE — IPAL (Signed)
  Interdisciplinary Goals of Care Family Meeting   Date carried out: 02/11/2022  Location of the meeting: Conference room  Member's involved: Physician, Family Member or next of kin, and Palliative care team member  Durable Power of Attorney or acting medical decision maker: 5 adult children    Discussion: We discussed goals of care for [employer] .  NP Ferolito and I met with Ms. Zeller's 3 sons, 1 daughter, daughter in law in person and had her eldest son was available on speaker phone. We attempted to discuss her ongoing care and her family's goals moving forward. Most of the discussion was spent discussing her previous care this admission. We reviewed her symptoms prior to hospitalization and the early portion of her hospitalization. Her family feels that their trust has been violated throughout her stay. They wished to be present prior to several procedures that have been performed where they would have liked to have prayed with her and were unable to do that. They are very nervous that a tracheostomy is being rushed and she will not have time to heal. They want to ensure that we understand their wishes for her to have maximal opportunity to recover from her acute illness. Although we tried to discuss the spectrum of possible outcomes, including worst case scenarios the family was unwilling to consider these possibilities. They are believers in God and do not feel that they are able to consider anything other than a best case scenario at this time. They were willing to select her son [name] as the primary family contact and her daughter as the secondary contact. All of her children are aligned in their thinking and excellent at communicating together, and they have no plans for anyone to make decisions on their mother's care unilaterally. They are interested in proceeding with tracheostomy Monday afternoon but want to be present to pray with her before the procedure.    Code status: Full  Code  Disposition: Continue current acute care  Time spent for the meeting: 105 min.    Julian Hy, DO  02/11/2022, 6:40 PM

## 2022-02-11 NOTE — Progress Notes (Signed)
PHARMACY NOTE:  ANTIMICROBIAL RENAL DOSAGE ADJUSTMENT  Current antimicrobial regimen includes a mismatch between antimicrobial dosage and estimated renal function.  As per policy approved by the Pharmacy & Therapeutics and Medical Executive Committees, the antimicrobial dosage will be adjusted accordingly.  Current antimicrobial dosage:  ampicillin 2gm IV q6h  Indication: Endocarditis  Renal Function:  Estimated Creatinine Clearance: 26.8 mL/min (A) (by C-G formula based on SCr of 1.82 mg/dL (H)).     Antimicrobial dosage has been changed to:  ampicillin 2gm IV q6h  Additional comments:   Thank you for allowing pharmacy to be a part of this patient's care.  Hildred Laser, PharmD Clinical Pharmacist **Pharmacist phone directory can now be found on [email or web address] ([email or web address]).  Listed under [facility].

## 2022-02-11 NOTE — Plan of Care (Signed)
  Problem: Clinical Measurements: Goal: Ability to maintain clinical measurements within normal limits will improve Outcome: Progressing Goal: Will remain free from infection Outcome: Progressing Goal: Diagnostic test results will improve Outcome: Progressing Goal: Respiratory complications will improve Outcome: Progressing Goal: Cardiovascular complication will be avoided Outcome: Progressing   Problem: Activity: Goal: Risk for activity intolerance will decrease Outcome: Progressing   Problem: Nutrition: Goal: Adequate nutrition will be maintained Outcome: Progressing   Problem: Elimination: Goal: Will not experience complications related to bowel motility Outcome: Progressing   Problem: Pain Managment: Goal: General experience of comfort will improve Outcome: Progressing   Problem: Safety: Goal: Ability to remain free from injury will improve Outcome: Progressing   Problem: Skin Integrity: Goal: Risk for impaired skin integrity will decrease Outcome: Progressing   

## 2022-02-11 NOTE — Progress Notes (Signed)
Palliative Medicine Inpatient Follow Up Note   HPI: 76 year old woman who presented to [facility] ED 9/18 post-PEA arrest. PMHx significant for HTN, HLD, HFpEF, HOCM, asthma, T2DM, GERD, obesity. Patient s/p arrest and endured CPR x22 minutes. S/P two intubations. Patient has suffered multiple embolic/septic strokes, in the setting of endocarditis. Palliative care has been asked to get involved to further assist in goals of care conversations.   Today's Discussion 02/11/2022  *Please note that this is a verbal dictation therefore any spelling or grammatical errors are due to the "Green One" system interpretation.  Chart reviewed inclusive of vital signs, progress notes, laboratory results, and diagnostic images.   A meeting was held this evening with patient's son Hart Robinsons on speaker phone, and Audry Pili as well as patient's daughter Ashby Dawes and daughter-in-law Jan Fireman.   Dr. Carlis Abbott led the meeting this evening.  Introductions were complete regarding what palliative care is and our role in Kambri's care.  Patient's son [name] provided a formal account of what happened on the day of patient's hospitalization and arrest as well as sequential events since then.  Dr. Carlis Abbott was able to explain very thoroughly the concerns regarding Cacey's present hospital stay in the setting of her endocarditis of her mitral valve and the possibility of ongoing strokes, degradation of the valve, and further physiological decline.  Dr. Carlis Abbott gave an update on patient's physical health state and discussed the various organ dysfunction she is presently experiencing.  She reviewed that at this time she is becoming more concerned regarding patient's kidney function and a work-up is presently in process.  Discussion regarding the tracheostomy procedure was held in great detail.  Patient's family was initially quite ambivalent as there had been a wide array of procedures which had occurred such as extubation and  TEE which they were not present for nor vocalize readiness for.  We spent much of the time together listening to their concerns regarding patient's safety.  Ultimately, the family decided to move forward with tracheostomy on Monday though they would like to speak with Dr. Valeta Harms in advance to verify they can be present and pray with her mother prior to the procedure.  When we tried to talk about the potential long-term effects of patient's condition the family shared that they were believers and stopped the conversation vocalizing that they are only going to think positively at this point in time.  We have determined that patient's son [name] and daughter [name] will at this point in time be the family's liaisons and healthcare proxy for Mei herself.  Provided Electronic copy of "Hard Choices for Loving People" booklet.   Questions and concerns addressed/Palliative Support Provided.   Objective Assessment: Vital Signs Vitals:   02/11/22 0827 02/11/22 1154  BP:    Pulse:  (!) 51  Resp:  20  Temp: 97.6 F (36.4 C) 97.8 F (36.6 C)  SpO2:  92%    Intake/Output Summary (Last 24 hours) at 02/11/2022 1305 Last data filed at 02/11/2022 0600 Gross per 24 hour  Intake 1827.81 ml  Output 700 ml  Net 1127.81 ml   Last Weight  Most recent update: 02/11/2022  6:19 AM    Weight  93.3 kg (205 lb 11 oz)            Gen:  Critically ill appearing Elderly AA F in NAD HEENT: ETT, dry mucous membranes CV: Irregular rate and rhythm  PULM: Mechanical ventilator ABD: soft/nontender  EXT: Generalized edema  Neuro: Sedated  SUMMARY OF RECOMMENDATIONS   Full Code/Full Scope of Care   Family would like to proceed with tracheostomy on Monday after speaking with Dr. Lina Sar and daughter [name] will be patient's healthcare proxy's   Ongoing Palliative support incrementally is patient's family has vocalized very strongly the desire for full scope of  care  Time Spent: 125 minutes  Billing based on MDM: High  Problems Addressed: One acute or chronic illness or injury that poses a threat to life or bodily function  Amount and/or Complexity of Data: Category 3:Discussion of management or test interpretation with external physician/other qualified health care professional/appropriate source (not separately reported)  Risks: Decision regarding hospitalization or escalation of hospital care and Decision not to resuscitate or to de-escalate care because of poor prognosis ______________________________________________________________________________________ [place] Team Team Cell Phone: [phone] Please utilize secure chat with additional questions, if there is no response within 30 minutes please call the above phone number  Palliative Medicine Team providers are available by phone from 7am to 7pm daily and can be reached through the team cell phone.  Should this patient require assistance outside of these hours, please call the patient's attending physician.

## 2022-02-12 ENCOUNTER — Inpatient Hospital Stay (HOSPITAL_COMMUNITY): Payer: Medicare Other

## 2022-02-12 ENCOUNTER — Encounter (HOSPITAL_COMMUNITY): Payer: Self-pay | Admitting: Pulmonary Disease

## 2022-02-12 DIAGNOSIS — J189 Pneumonia, unspecified organism: Secondary | ICD-10-CM

## 2022-02-12 DIAGNOSIS — J9601 Acute respiratory failure with hypoxia: Secondary | ICD-10-CM | POA: Diagnosis not present

## 2022-02-12 DIAGNOSIS — I059 Rheumatic mitral valve disease, unspecified: Secondary | ICD-10-CM | POA: Diagnosis not present

## 2022-02-12 DIAGNOSIS — J80 Acute respiratory distress syndrome: Secondary | ICD-10-CM | POA: Diagnosis not present

## 2022-02-12 DIAGNOSIS — R7881 Bacteremia: Secondary | ICD-10-CM | POA: Diagnosis not present

## 2022-02-12 DIAGNOSIS — Z9911 Dependence on respirator [ventilator] status: Secondary | ICD-10-CM | POA: Diagnosis not present

## 2022-02-12 DIAGNOSIS — D696 Thrombocytopenia, unspecified: Secondary | ICD-10-CM

## 2022-02-12 DIAGNOSIS — N179 Acute kidney failure, unspecified: Secondary | ICD-10-CM | POA: Diagnosis not present

## 2022-02-12 DIAGNOSIS — R9431 Abnormal electrocardiogram [ECG] [EKG]: Secondary | ICD-10-CM

## 2022-02-12 DIAGNOSIS — I469 Cardiac arrest, cause unspecified: Secondary | ICD-10-CM | POA: Diagnosis not present

## 2022-02-12 DIAGNOSIS — I330 Acute and subacute infective endocarditis: Secondary | ICD-10-CM | POA: Diagnosis not present

## 2022-02-12 DIAGNOSIS — Y95 Nosocomial condition: Secondary | ICD-10-CM

## 2022-02-12 LAB — POCT I-STAT 7, (LYTES, BLD GAS, ICA,H+H)
Acid-base deficit: 11 mmol/L — ABNORMAL HIGH (ref 0.0–2.0)
Acid-base deficit: 10 mmol/L — ABNORMAL HIGH (ref 0.0–2.0)
Bicarbonate: 19.2 mmol/L — ABNORMAL LOW (ref 20.0–28.0)
Bicarbonate: 20.5 mmol/L (ref 20.0–28.0)
Calcium, Ion: 1.15 mmol/L (ref 1.15–1.40)
Calcium, Ion: 1.14 mmol/L — ABNORMAL LOW (ref 1.15–1.40)
HCT: 27 % — ABNORMAL LOW (ref 36.0–46.0)
HCT: 27 % — ABNORMAL LOW (ref 36.0–46.0)
Hemoglobin: 9.2 g/dL — ABNORMAL LOW (ref 12.0–15.0)
Hemoglobin: 9.2 g/dL — ABNORMAL LOW (ref 12.0–15.0)
O2 Saturation: 86 %
O2 Saturation: 100 %
Patient temperature: 98.8
Patient temperature: 97.7
Potassium: 4.8 mmol/L (ref 3.5–5.1)
Potassium: 5.1 mmol/L (ref 3.5–5.1)
Sodium: 134 mmol/L — ABNORMAL LOW (ref 135–145)
Sodium: 133 mmol/L — ABNORMAL LOW (ref 135–145)
TCO2: 21 mmol/L — ABNORMAL LOW (ref 22–32)
TCO2: 23 mmol/L (ref 22–32)
pCO2 arterial: 65.9 mmHg (ref 32–48)
pCO2 arterial: 70.9 mmHg (ref 32–48)
pH, Arterial: 7.072 — CL (ref 7.35–7.45)
pH, Arterial: 7.066 — CL (ref 7.35–7.45)
pO2, Arterial: 73 mmHg — ABNORMAL LOW (ref 83–108)
pO2, Arterial: 301 mmHg — ABNORMAL HIGH (ref 83–108)

## 2022-02-12 LAB — CBC WITH DIFFERENTIAL/PLATELET
Abs Immature Granulocytes: 1.33 10*3/uL — ABNORMAL HIGH (ref 0.00–0.07)
Basophils Absolute: 0.1 10*3/uL (ref 0.0–0.1)
Basophils Relative: 1 %
Eosinophils Absolute: 0.5 10*3/uL (ref 0.0–0.5)
Eosinophils Relative: 2 %
HCT: 23.9 % — ABNORMAL LOW (ref 36.0–46.0)
Hemoglobin: 7.7 g/dL — ABNORMAL LOW (ref 12.0–15.0)
Immature Granulocytes: 6 %
Lymphocytes Relative: 10 %
Lymphs Abs: 2.4 10*3/uL (ref 0.7–4.0)
MCH: 28.4 pg (ref 26.0–34.0)
MCHC: 32.2 g/dL (ref 30.0–36.0)
MCV: 88.2 fL (ref 80.0–100.0)
Monocytes Absolute: 1.1 10*3/uL — ABNORMAL HIGH (ref 0.1–1.0)
Monocytes Relative: 4 %
Neutro Abs: 19 10*3/uL — ABNORMAL HIGH (ref 1.7–7.7)
Neutrophils Relative %: 77 %
Platelets: 48 10*3/uL — ABNORMAL LOW (ref 150–400)
RBC: 2.71 MIL/uL — ABNORMAL LOW (ref 3.87–5.11)
RDW: 24.9 % — ABNORMAL HIGH (ref 11.5–15.5)
WBC: 24.4 10*3/uL — ABNORMAL HIGH (ref 4.0–10.5)
nRBC: 5.4 % — ABNORMAL HIGH (ref 0.0–0.2)

## 2022-02-12 LAB — BASIC METABOLIC PANEL
Anion gap: 19 — ABNORMAL HIGH (ref 5–15)
BUN: 107 mg/dL — ABNORMAL HIGH (ref 8–23)
CO2: 18 mmol/L — ABNORMAL LOW (ref 22–32)
Calcium: 8.7 mg/dL — ABNORMAL LOW (ref 8.9–10.3)
Chloride: 99 mmol/L (ref 98–111)
Creatinine, Ser: 2.61 mg/dL — ABNORMAL HIGH (ref 0.44–1.00)
GFR, Estimated: 18 mL/min — ABNORMAL LOW (ref >60)
Glucose, Bld: 151 mg/dL — ABNORMAL HIGH (ref 70–99)
Potassium: 4.7 mmol/L (ref 3.5–5.1)
Sodium: 136 mmol/L (ref 135–145)

## 2022-02-12 LAB — TROPONIN I (HIGH SENSITIVITY): Troponin I (High Sensitivity): 220 ng/L (ref <18)

## 2022-02-12 LAB — GLUCOSE, CAPILLARY
Glucose-Capillary: 128 mg/dL — ABNORMAL HIGH (ref 70–99)
Glucose-Capillary: 135 mg/dL — ABNORMAL HIGH (ref 70–99)
Glucose-Capillary: 142 mg/dL — ABNORMAL HIGH (ref 70–99)
Glucose-Capillary: 163 mg/dL — ABNORMAL HIGH (ref 70–99)
Glucose-Capillary: 189 mg/dL — ABNORMAL HIGH (ref 70–99)

## 2022-02-12 LAB — TRIGLYCERIDES: Triglycerides: 135 mg/dL (ref <150)

## 2022-02-12 LAB — MAGNESIUM: Magnesium: 2.3 mg/dL (ref 1.7–2.4)

## 2022-02-12 LAB — PHOSPHORUS: Phosphorus: 9.2 mg/dL — ABNORMAL HIGH (ref 2.5–4.6)

## 2022-02-12 MED ORDER — ROCURONIUM BROMIDE 50 MG/5ML IV SOLN
100.0000 mg | Freq: Once | INTRAVENOUS | Status: AC
  Administered 2022-02-12: 100 mg via INTRAVENOUS

## 2022-02-12 MED ORDER — CISATRACURIUM BESYLATE 20 MG/10ML IV SOLN
0.1000 mg/kg | INTRAVENOUS | Status: DC | PRN
  Filled 2022-02-12: qty 10

## 2022-02-12 MED ORDER — FUROSEMIDE 10 MG/ML IJ SOLN
40.0000 mg | Freq: Once | INTRAMUSCULAR | Status: AC
  Administered 2022-02-12: 40 mg via INTRAVENOUS
  Filled 2022-02-12: qty 4

## 2022-02-12 MED ORDER — ALBUMIN HUMAN 25 % IV SOLN
50.0000 g | Freq: Once | INTRAVENOUS | Status: AC
  Administered 2022-02-12: 50 g via INTRAVENOUS
  Filled 2022-02-12: qty 200

## 2022-02-12 MED ORDER — SODIUM BICARBONATE 8.4 % IV SOLN
50.0000 meq | Freq: Once | INTRAVENOUS | Status: AC
  Administered 2022-02-12: 50 meq via INTRAVENOUS
  Filled 2022-02-12: qty 50

## 2022-02-12 MED ORDER — ROCURONIUM BROMIDE 10 MG/ML (PF) SYRINGE
PREFILLED_SYRINGE | INTRAVENOUS | Status: AC
  Filled 2022-02-12: qty 10

## 2022-02-12 MED ORDER — FENTANYL CITRATE PF 50 MCG/ML IJ SOSY: 200.0000 ug | PREFILLED_SYRINGE | Freq: Once | INTRAMUSCULAR | Status: DC

## 2022-02-12 MED ORDER — ARTIFICIAL TEARS OPHTHALMIC OINT
1.0000 | TOPICAL_OINTMENT | Freq: Three times a day (TID) | OPHTHALMIC | Status: DC
  Administered 2022-02-12 – 2022-02-19 (×23): 1 via OPHTHALMIC
  Filled 2022-02-12 (×4): qty 3.5

## 2022-02-12 MED ORDER — SODIUM BICARBONATE 8.4 % IV SOLN
100.0000 meq | Freq: Once | INTRAVENOUS | Status: AC
  Administered 2022-02-12: 100 meq via INTRAVENOUS
  Filled 2022-02-12 (×2): qty 50

## 2022-02-12 MED ORDER — MIDAZOLAM HCL 2 MG/2ML IJ SOLN: 5.0000 mg | Freq: Once | INTRAMUSCULAR | Status: DC

## 2022-02-12 MED ORDER — SODIUM CHLORIDE 0.9 % IV SOLN: INTRAVENOUS | Status: DC | PRN

## 2022-02-12 MED ORDER — ETOMIDATE 2 MG/ML IV SOLN: 20.0000 mg | Freq: Once | INTRAVENOUS | Status: DC

## 2022-02-12 MED ORDER — FUROSEMIDE 10 MG/ML IJ SOLN
8.0000 mg/h | INTRAVENOUS | Status: DC
  Administered 2022-02-12 – 2022-02-14 (×3): 8 mg/h via INTRAVENOUS
  Filled 2022-02-12 (×4): qty 20

## 2022-02-12 MED ORDER — ALBUMIN HUMAN 25 % IV SOLN
50.0000 g | Freq: Four times a day (QID) | INTRAVENOUS | Status: AC
  Administered 2022-02-12 – 2022-02-13 (×3): 50 g via INTRAVENOUS
  Filled 2022-02-12 (×2): qty 200

## 2022-02-12 MED ORDER — FENTANYL CITRATE PF 50 MCG/ML IJ SOSY
100.0000 ug | PREFILLED_SYRINGE | Freq: Once | INTRAMUSCULAR | Status: AC
  Administered 2022-02-12: 100 ug via INTRAVENOUS

## 2022-02-12 MED ORDER — ROCURONIUM BROMIDE 50 MG/5ML IV SOLN
100.0000 mg | Freq: Once | INTRAVENOUS | Status: DC
  Filled 2022-02-12: qty 10

## 2022-02-12 MED ORDER — FENTANYL 2500MCG IN NS 250ML (10MCG/ML) PREMIX INFUSION
25.0000 ug/h | INTRAVENOUS | Status: DC
  Administered 2022-02-12: 75 ug/h via INTRAVENOUS
  Administered 2022-02-13 – 2022-02-15 (×2): 50 ug/h via INTRAVENOUS
  Administered 2022-02-17 – 2022-02-18 (×5): 250 ug/h via INTRAVENOUS
  Administered 2022-02-19: 25 ug/h via INTRAVENOUS
  Filled 2022-02-12 (×10): qty 250

## 2022-02-12 MED ORDER — ROCURONIUM BROMIDE 50 MG/5ML IV SOLN
1.0000 mg/kg | Freq: Once | INTRAVENOUS | Status: AC
  Administered 2022-02-12: 96 mg via INTRAVENOUS
  Filled 2022-02-12: qty 9.6

## 2022-02-12 MED ORDER — FUROSEMIDE 10 MG/ML IJ SOLN
120.0000 mg | Freq: Once | INTRAVENOUS | Status: AC
  Administered 2022-02-12: 120 mg via INTRAVENOUS
  Filled 2022-02-12: qty 10

## 2022-02-12 MED ORDER — LINEZOLID 600 MG/300ML IV SOLN
600.0000 mg | Freq: Two times a day (BID) | INTRAVENOUS | Status: DC
  Administered 2022-02-12 – 2022-02-14 (×4): 600 mg via INTRAVENOUS
  Filled 2022-02-12 (×5): qty 300

## 2022-02-12 MED ORDER — FENTANYL BOLUS VIA INFUSION
25.0000 ug | INTRAVENOUS | Status: DC | PRN
  Administered 2022-02-15 – 2022-02-16 (×5): 25 ug via INTRAVENOUS

## 2022-02-12 MED ORDER — NOREPINEPHRINE 4 MG/250ML-% IV SOLN
0.0000 ug/min | INTRAVENOUS | Status: DC
  Administered 2022-02-12: 2 ug/min via INTRAVENOUS
  Administered 2022-02-13 (×3): 40 ug/min via INTRAVENOUS
  Administered 2022-02-13: 30 ug/min via INTRAVENOUS
  Administered 2022-02-13: 19 ug/min via INTRAVENOUS
  Administered 2022-02-13: 40 ug/min via INTRAVENOUS
  Filled 2022-02-12 (×7): qty 250

## 2022-02-12 MED ORDER — SODIUM CHLORIDE 0.9 % IV SOLN
2.0000 g | INTRAVENOUS | Status: DC
  Administered 2022-02-12 – 2022-02-17 (×6): 2 g via INTRAVENOUS
  Filled 2022-02-12 (×6): qty 12.5

## 2022-02-12 MED ORDER — FENTANYL CITRATE PF 50 MCG/ML IJ SOSY
25.0000 ug | PREFILLED_SYRINGE | Freq: Once | INTRAMUSCULAR | Status: AC
  Administered 2022-02-12: 25 ug via INTRAVENOUS

## 2022-02-12 NOTE — Progress Notes (Addendum)
Dr Carlis Abbott and nurses at bedside voice concern of midline working.  Assessed with ultrasound.  Noted catheter in place in vein.  No outward signs of infiltrate noted, area soft.  No need at this time to dc current midline and start another PIV. Secure chat sent to dr Carlis Abbott, RN's aware.

## 2022-02-12 NOTE — Progress Notes (Signed)
Pt placed in prone position. ETT was taped and secured 22 '@lip'$ . Mepilex was placed on cheeks to prevent breakdown. Pts head was positioned to the left. No complications noted.

## 2022-02-12 NOTE — Progress Notes (Addendum)
Notified by RT of slow drop in saturations again to ~84%. ABG confirms low saturations. On PEEP 16, 100%. ABG    Component Value Date/Time   PHART 7.072 (LL) 02/12/2022 1539   PCO2ART 65.9 (HH) 02/12/2022 1539   PO2ART 73 (L) 02/12/2022 1539   HCO3 19.2 (L) 02/12/2022 1539   TCO2 21 (L) 02/12/2022 1539   ACIDBASEDEF 11.0 (H) 02/12/2022 1539   O2SAT 86 02/12/2022 1539      Prone positioning, heavy sedation, NMB ordered.  AM CBC with severe leukocytosis- linezolid and pip-tazo ordered.   EKG obtained by RN with TWI in II, III AVF. On admission EKG TWI was present in III only. Checking trop.  Additional 20 min cc time.  Julian Hy, DO 02/12/22 3:46 PM [facility]   Per family's request, Cardiology has been consulted, who will see her this afternoon.  Julian Hy, DO 02/12/22 4:22 PM [place] [facility]

## 2022-02-12 NOTE — Progress Notes (Signed)
RT called to assess pt for O2 desaturation. Upon arrival to room pts sats were 78%. RT removed pt from vent and bagged with 100% FIO2 and PEEP valve set at 15. Sats increased to 90%. Pt placed back on vent to give neb treatment. Sats started dropping to low 80's. Pt bagged again until MD came to bedside. Pt placed back on vent with increase in PEEP to 16. Sats are currently 93%. RT will obtain ABG in 1 hr.

## 2022-02-12 NOTE — Consult Note (Addendum)
Cardiology Consultation   Patient ID: Alyssa Ingram MRN: [id number]; DOB: 01-Jun-1945  Admit date: 01/19/2022 Date of Consult: 02/12/2022  PCP:  [name], MD   [place] [facility] Cardiologist:  Freada Bergeron, MD        Patient Profile:   Alyssa Ingram is a 76 y.o. female with a hx of HOCM, HTN, DM2, HLD, GERD, chronic HFpEF, anemia, morbid obesity, mild MR, mild-moderate MS, severe pulmonary HTN by echo 06/2021 who is being seen 02/12/2022 for the evaluation of CHF at the request of Dr. Carlis Abbott.  History of Present Illness:   Ms. Smart was a remote patient of Dr. Jacalyn Lefevre with known history of HOCM. She had a remote nuclear stress test in 2011 with normal EF and normal perfusion. She had an ETT in 2014 with poor exercise tolerance, nondiagnostic due to inability to complete exercise due to unsteadiness/fall risk. Holter in 2017 showed NSR with PACs, PVCs, and nonconducted PACs. Her last OV was in 2018 with plan for medication adjustment and 1 year follow-up. She was then lost to follow-up with cancellations/no-shows. She was readmitted 06/2021 when seen for acute on chronic diastolic CHF, acute respiratory failure, and lower extremity edema with possible cellulitis. Echo during that admission confirmed again concern for HCM with severe pulm HTN, mild MR, mild-moderate MS with medications adjusted. She did not return for scheduled follow-up on 07/05/21.  She has since been readmitted 01/14/2022 to present. Per notes, she had presented with multiple symptoms including weakness, malaise, abdominal discomfort, neck pain, and chronic leg/foot discomfort. She became worse at home and more altered so 911 was called and she lost consciousness. 911 called, and she had completely lost consciousness at that time. When EMS arrived, breathing was agonal and she was pulseless, CPR x 22 min with ROSC, [place] airway in the field and intubated upon arrival to the ED. She required  initiation of pressors and broad spectrum abs. 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 Per notes, she was initially waking up and folowing commands on 9/19 and extubated 9/20 but shortly after extubation required reintubation for upper airway edema. On 9/21 had decreased responsiveness with MRI brain showing multiple embolic/septic strokes, raising concern for endocarditis. 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 She also was previously on amiodarone during this admission but stopped due to bradycardia. She remains in sinus bradycardia with HR in the 50s. Anticoagulation is on hold given high risk of hemorrhagic conversion with brain findings. She also has marked thrombocytopenia. Today her propofol was weaned off to allow for neurologic exam but with noted vent dyssynchrony and hypoxia. CXR showed severe bilateral pulmonary edema. She is being proned and is receiving IV Lasix. She is making urine. Family has requested cardiology consultation. Primary team has engaged palliative team and discussed [place] with family who is hopeful for recovery. Family expresses frustration with communication and have many questions about the sequence of events to her decline. They are concerned about rushing into procedures. They request to present to be able to pray with  her before any procedures.   Most recent labs notable for Cr 2.61 (uptrending),  Hgb 7.7 (similarly this admission), leukocytosis of 24k, plt 48 (on the decline, HIT Ab pending). Labs closer to time of arrival showed troponin 94->332, elevated AST/ALT, lactic acid of 7.3.  Past Medical History:  Diagnosis Date   Abnormal MRI, spine 11/2011   ?discitis L5-S1, s/p CT bx 12/12/11   Allergic rhinitis, cause unspecified 01/31/2013   Arthritis    Asthma    Cervical cancer ([place]) 1980   Chronic diastolic heart failure (HCC)    Diabetic retinopathy ([place])    GERD (gastroesophageal reflux disease)    H/O cardiovascular stress test    Nuclear study in 2011 normal perfusion   HOCM (hypertrophic obstructive cardiomyopathy) ([place])    Hyperlipidemia    Hypertension    Kidney stones    Mitral regurgitation    Mitral stenosis    Obesity    Premature atrial contractions    PVC's (premature ventricular contractions)    Type II or unspecified type diabetes mellitus without mention of complication, uncontrolled 01/31/2013    Past Surgical History:  Procedure Laterality Date   ABDOMINAL HYSTERECTOMY     JOINT REPLACEMENT     right knee 2006   rotater cuff     right, 2005     Home Medications:  Prior to Admission medications   Medication Sig Start Date End Date Taking? Authorizing Provider  allopurinol (ZYLOPRIM) 100 MG tablet TAKE 1 TABLET BY MOUTH EVERY DAY 08/26/21  Yes [name], MD  ammonium lactate (AMLACTIN) 12 % cream APPLY TOPICALLY AS NEEDED FOR DRY SKIN. 05/16/18  Yes Trula Slade, DPM  aspirin 81 MG tablet Take 162 mg by mouth daily.    Yes [provider]  atorvastatin (LIPITOR) 80 MG tablet TAKE 1 TABLET BY MOUTH EVERY DAY 08/29/21  Yes [name], MD  carvedilol (COREG) 25 MG tablet Take 1 tablet (25 mg total) by mouth 2 (two) times daily with a meal. 07/15/21 01/22/2022 Yes [name], MD  Cholecalciferol (D3 ADULT PO) Take 1 tablet by mouth daily.   Yes [provider]  diltiazem (CARDIZEM CD) 180 MG 24 hr capsule Take 1 capsule (180  mg total) by mouth daily. 01/04/22 02/03/22 Yes [name], MD  DULoxetine (CYMBALTA) 60 MG capsule TAKE 1 CAPSULE BY MOUTH EVERY DAY 08/26/21  Yes [name], MD  furosemide (LASIX) 40 MG tablet Take 1 tablet (40 mg total) by mouth 2 (two) times daily. 07/15/21 01/14/2022 Yes [name], MD  glipiZIDE (GLUCOTROL XL) 2.5 MG 24 hr tablet Take 1 tablet (2.5 mg total) by mouth daily with breakfast. 07/28/21  Yes [name], MD  Multiple Vitamin (MULTIVITAMIN ADULT PO) Take 1 tablet by mouth daily.   Yes [provider]  pantoprazole (PROTONIX) 40 MG tablet TAKE 1 TABLET BY MOUTH EVERY DAY 08/29/21  Yes [name], MD  triamcinolone (NASACORT AQ) 55 MCG/ACT AERO nasal inhaler Place 2 sprays into the nose daily. Patient taking differently: Place 2 sprays into the nose daily as needed (rhinitis). 12/31/18  Yes [name], MD  albuterol (ACCUNEB) 0.63 MG/3ML nebulizer solution INHALE 1 VIAL BY MOUTH VIA NEBULIZATION EVERY 6 HOURS AS NEEDED FOR WHEEZING. Patient not taking: Reported on 02/06/2022 04/03/18   [name], MD  albuterol [facility] HFA) [phone] Base) MCG/ACT inhaler Inhale 2 puffs into the lungs 2 (two) times daily. Patient not taking: Reported on  01/22/2022 06/13/16   [name], MD  cetirizine (ZYRTEC) 10 MG tablet TAKE 1 TABLET BY MOUTH EVERY DAY AS NEEDED Patient not taking: Reported on 02/08/2022 11/17/20   [name], MD  Continuous Blood Gluc Receiver (DEXCOM G7 RECEIVER) DEVI Use as directed once daily E11.9 08/31/21   [name], MD  Continuous Blood Gluc Sensor (DEXCOM G7 SENSOR) MISC Use as directed every 10 days E11.9 12/09/21   [name], MD  Lancets MISC Use as directed three times daily E11.9 06/11/20   [name], MD  meclizine (ANTIVERT) 25 MG tablet TAKE 1 TABLET BY MOUTH THREE TIMES A DAY as needed Patient not taking: Reported on 12/30/2021 01/18/21   [name], MD  PFIZER COVID-19 [facility] BIVALENT injection  08/02/21   [provider]     Inpatient Medications: Scheduled Meds:  artificial tears  1 Application Both Eyes A0T   Chlorhexidine Gluconate Cloth  6 each Topical Daily   [START ON 02/13/2022] etomidate  20 mg Intravenous Once   [START ON 02/13/2022] fentaNYL (SUBLIMAZE) injection  200 mcg Intravenous Once   fentaNYL (SUBLIMAZE) injection  25 mcg Intravenous Once   free water  200 mL Per Tube Q8H   hydrocerin   Topical BID   insulin aspart  0-15 Units Subcutaneous Q4H   ipratropium-albuterol  3 mL Nebulization Q6H   lidocaine  2 patch Transdermal Q24H   [START ON 02/13/2022] midazolam  5 mg Intravenous Once   multivitamin  15 mL Per Tube Daily   mouth rinse  15 mL Mouth Rinse Q2H   pantoprazole  40 mg Per Tube Daily   sodium bicarbonate  650 mg Per Tube BID   sodium chloride flush  10-40 mL Intracatheter Q12H   Continuous Infusions:  albumin human     ampicillin (OMNIPEN) IV 2 g (02/12/22 1545)   ceFEPime (MAXIPIME) IV     feeding supplement (VITAL AF 1.2 CAL) 55 mL/hr at 02/12/22 1100   fentaNYL infusion INTRAVENOUS     furosemide 120 mg (02/12/22 1558)   furosemide (LASIX) 200 mg in dextrose 5 % 100 mL (2 mg/mL) infusion 8 mg/hr (02/12/22 1601)   linezolid (ZYVOX) IV     propofol (DIPRIVAN) infusion Stopped (02/12/22 0757)   PRN Meds: acetaminophen, albuterol, cisatracurium (NIMBEX) injection, docusate, fentaNYL, fentaNYL (SUBLIMAZE) injection, midazolam, ondansetron (ZOFRAN) IV, mouth rinse, polyethylene glycol, polyvinyl alcohol, sodium chloride flush  Allergies:    Allergies  Allergen Reactions   Sulfa Antibiotics Hives   Metformin And Related Other (See Comments)    intolerance   Prednisone Swelling    Social History:   Social History   Socioeconomic History   Marital status: Widowed    Spouse name: Not on file   Number of children: 5   Years of education: 14   Highest education level: Not on file  Occupational History   Occupation: Retired Optometrist  Tobacco Use   Smoking status:  Never   Smokeless tobacco: Never  Substance and Sexual Activity   Alcohol use: No    Alcohol/week: 0.0 standard drinks of alcohol   Drug use: No   Sexual activity: Not on file  Other Topics Concern   Not on file  Social History Narrative   Not on file   Social Determinants of Health   Financial Resource Strain: Not on file  Food Insecurity: Not on file  Transportation Needs: Not on file  Physical Activity: Not on file  Stress: Not on file  Social Connections: Not on file  Intimate Partner Violence: Not on file    Family History:   Family History  Problem Relation Age of Onset   Heart disease Father    Arthritis Other    Heart disease Other    Hypertension Other    Diabetes Other    Alcohol abuse Other    Arthritis Other    Cancer Other        Lung Cancer   Heart disease Other    Hypertension Other    Sudden death Other    Kidney disease Other    Mental illness Other    Diabetes Other    Colon cancer Neg Hx      ROS:  Unable to obtain  Physical Exam/Data:   Vitals:   02/12/22 1211 02/12/22 1349 02/12/22 1432 02/12/22 1546  BP:   (!) 101/55   Pulse:   (!) 55   Resp:   (!) 32   Temp: 98.2 F (36.8 C)     TempSrc: Oral     SpO2:  (!) 84% 91% (!) 86%  Weight:      Height:        Intake/Output Summary (Last 24 hours) at 02/12/2022 1647 Last data filed at 02/12/2022 1100 Gross per 24 hour  Intake 6608.54 ml  Output 265 ml  Net 6343.54 ml      02/12/2022    5:00 AM 02/11/2022    5:00 AM 02/09/2022    5:00 AM  Last 3 Weights  Weight (lbs) 211 lb 10.3 oz 205 lb 11 oz 194 lb 0.1 oz  Weight (kg) 96 kg 93.3 kg 88 kg     Body mass index is 41.33 kg/m.  Exam per MD. General: Ill appearing elderly female, obese habitus Head: Normocephalic, atraumatic, sclera non-icteric, no xanthomas, nares are without discharge. Intubated Neck: Negative for carotid bruits. JVP difficult to assess with habitus Lungs: Coarse rhonchorous BS on vent Heart: RRR with + SEM,  no rubs or gallops.  Abdomen: Soft, non-tender, rounded with normoactive bowel sounds. No rebound/guarding. Extremities: No clubbing or cyanosis. Markedly edematous with chronic venous stasis changes Neuro: Sedated. Not agitated. Psych:  Cannot assess.   EKG:  The EKG was personally reviewed and demonstrates:   Initial tracing NSR.  9/26 atrial fib 108bpm, no acute STT changes, LVH F/u tracing today, not scanned in Epic - SB 51bpm, nonspecific STTW changes with inferior TWI  Telemetry:  Telemetry was personally reviewed and demonstrates:  presently NSR  Relevant CV Studies: Outlined above Otherwise last TEE:    1. Left ventricular ejection fraction, by estimation, is 70 to 75%. The  left ventricle has hyperdynamic function. The left ventricle has no  regional wall motion abnormalities. There is severe concentric left  ventricular hypertrophy. Left ventricular  diastolic function could not be evaluated.   2. Right ventricular systolic function is normal. The right ventricular  size is normal. There is moderately elevated pulmonary artery systolic  pressure. The estimated right ventricular systolic pressure is 67.6 mmHg.   3. Left atrial size was severely dilated. No left atrial/left atrial  appendage thrombus was detected.   4. Right atrial size was mildly dilated.   5. There is at least one highly mobile echodensity attached to the middle  (P2) scallop of the posterior leaflet, measuring up to 19 mm in length,  consistent with a vegetation or large ruptured chorda(e). In addition,  there may be at least one other  mobile echodensity attached to  the anterior mitral leaflet. There is  systolic anterior motion of the mitral leaflet, but there is no  significant LV outflow obstruction. The mitral valve is degenerative.  Moderate to severe mitral valve regurgitation. Mild   mitral stenosis. The mean mitral valve gradient is 6.1 mmHg with average  heart rate of 104 bpm. Moderate to  severe mitral annular calcification.   6. There is a tiny mobile echodensity that probably represents a Lambl's  excrescence. Calcificatis prominent on the noncoronary cusp. The aortic  valve is normal in structure. There is mild calcification of the aortic  valve. There is mild thickening of  the aortic valve. Aortic valve regurgitation is not visualized. Aortic  valve sclerosis/calcification is present, without any evidence of aortic  stenosis.   7. There is Moderate (Grade III) protruding plaque involving the aortic  arch.   Comparison(s): Compared to the 01/24/2022 study, major findings are  unchanged. Compared to the 06/19/2021 study, the mitral regurgitation is  much more severe and the jet is now eccentric, directed anterolaterally.  The jet direction is consistent with  flail motion or perforation of the middle (P2) scallop of the posterior  mitral leaflet. The echo findings do not suggest that the MR is due to  systolic anterior motion on the mitral valve. This could represent primary  chordal rupture or endocarditis (the   clinical scenario and the impression that there is more than one  echodensity attached to the mitral valve supports a diagnosis of  endocarditis).    FINDINGS:  There is a large, highly mobile vegetation on the anterior mitral leaflet.  There is severe mitral annulus calcification and there is systolic  anterior motion due to HOCM.  There is significant mitral insufficiency, probably moderate to severe,  eccentric, anteriorly directed.  The left ventricle is severely hypertrophic and hyperdynamic.  There is aortic valve sclerosis without stenosis. There is a small mobile  echodensity on the aortic valve that likely represents a Lambl's  excresence, rather than a vegetation.  Prominent aortic arch atherosclerosis.   RECOMMENDATIONS:     Treatment for endocarditis. Follow MR with TTE.   Laboratory Data:  High Sensitivity Troponin:   Recent Labs   Lab 01/14/2022 0425 01/16/2022 0850  TROPONINIHS 94* 332*     Chemistry Recent Labs  Lab 02/10/22 0509 02/11/22 0501 02/11/22 1512 02/12/22 0842 02/12/22 1539  NA 143 134* 135 136 134*  K 4.0 4.4 4.9 4.7 4.8  CL 108 96* 102 99  --   CO2 23 19* 18* 18*  --   GLUCOSE 100* 150* 144* 151*  --   BUN 86* 90* 100* 107*  --   CREATININE 1.57* 1.82* 2.27* 2.61*  --   CALCIUM 8.8* 8.5* 7.7* 8.7*  --   MG 2.3 2.2  --  2.3  --   GFRNONAA 34* 28* 22* 18*  --   ANIONGAP 12 19* 15 19*  --     No results for input(s): "PROT", "ALBUMIN", "AST", "ALT", "ALKPHOS", "BILITOT" in the last 168 hours. Lipids  Recent Labs  Lab 02/06/22 0323 02/09/22 0444 02/12/22 0842  CHOL 119  --   --   TRIG 70   < > 135  HDL 34*  --   --   LDLCALC 71  --   --   CHOLHDL 3.5  --   --    < > = values in this interval not displayed.    Hematology Recent Labs  Lab 02/10/22 0509 02/11/22 0700  02/12/22 0842 02/12/22 1539  WBC 20.2* 19.8* 24.4*  --   RBC 2.72* 2.48* 2.71*  --   HGB 7.8* 7.3* 7.7* 9.2*  HCT 24.3* 22.3* 23.9* 27.0*  MCV 89.3 89.9 88.2  --   MCH 28.7 29.4 28.4  --   MCHC 32.1 32.7 32.2  --   RDW 22.9* 24.0* 24.9*  --   PLT 116* 59* 48*  --    Thyroid No results for input(s): "TSH", "FREET4" in the last 168 hours.  BNPNo results for input(s): "BNP", "PROBNP" in the last 168 hours.  DDimer No results for input(s): "DDIMER" in the last 168 hours.   Radiology/Studies:  DG CHEST PORT 1 VIEW  Result Date: 02/12/2022 CLINICAL DATA:  Hypoxia. EXAM: PORTABLE CHEST 1 VIEW COMPARISON:  02/12/2022 at 8:57 a.m. FINDINGS: Bilateral airspace opacities have significantly increased from the earlier study, rapid increased consistent with pulmonary edema. Cardiac silhouette is enlarged, partly obscured by the contiguous lung opacities. Presumed bilateral effusions. No pneumothorax. Endotracheal tube and enteric tube are stable in well positioned. IMPRESSION: 1. Significant interval worsening of lung  aeration consistent with progression of pulmonary edema. Electronically Signed   By: Lajean Manes M.D.   On: 02/12/2022 14:40   DG CHEST PORT 1 VIEW  Result Date: 02/12/2022 CLINICAL DATA:  Status post cardiac arrest, difficulty breathing EXAM: PORTABLE CHEST 1 VIEW COMPARISON:  Previous studies including the examination of 02/08/2022 FINDINGS: Tip of endotracheal tube is 3.1 cm above the carina. Enteric tube is noted traversing the esophagus. Transverse diameter of heart is increased. There is slight decrease in pulmonary vascular congestion. Still, there is prominence of central pulmonary vessels suggesting CHF there is prominence of interstitial markings in parahilar regions and lower lung fields suggesting mild interstitial edema. There is no new focal pulmonary consolidation. Small bilateral pleural effusions are seen, more so on the right side. There is no pneumothorax. IMPRESSION: Cardiomegaly. There is decrease in pulmonary vascular congestion. There is residual CHF and mild interstitial pulmonary edema. Small bilateral pleural effusions, more so on the right side. Electronically Signed   By: Elmer Picker M.D.   On: 02/12/2022 11:24   US RENAL  Result Date: 02/11/2022 CLINICAL DATA:  AKI. History of renal stone seen on CT 02/01/2022. EXAM: RENAL / URINARY TRACT ULTRASOUND COMPLETE COMPARISON:  CT abdomen pelvis 02/01/2022 FINDINGS: Right Kidney: Renal measurements: 10.6 x 4.6 x 4.5 cm = volume: 115 mL. Diffuse increased echogenicity of the renal cortex. No mass or hydronephrosis visualized. A 0.9 cm echogenic focus at the lower pole of the right kidney may represent a nonobstructive calyceal stone. Left Kidney: Renal measurements: 11.4 x 5.9 x 5.2 cm = volume: 182 mL. Diffuse increased echogenicity of the renal cortex. No hydronephrosis visualized. Multiple punctate echogenic foci are seen within the renal cortex, corresponding to CT findings, and are favored to represent vascular  calcifications given their peripheral location. A 0.6 cm echogenic focus located more centrally in the middle third of the left kidney may represent a nonobstructive calyceal stone. Similarly, a 1.3 cm echogenic focus in the lower pole of the left kidney may also represent a nonobstructive calyceal stone. A 2.8 cm partially exophytic anechoic cyst is noted at the lower pole of the left kidney. Bladder: Not visualized due to Foley catheter. Other: Partially visualized right pleural effusion. IMPRESSION: 1. No evidence of hydronephrosis as queried. 2. Diffuse increased echogenicity of the bilateral renal cortices, suggestive of medical renal disease. 3. Multiple punctate echogenic foci are seen within  the left renal cortex, corresponding to CT findings, and are favored to represent vascular calcifications given their peripheral location. 4. A 0.9 cm echogenic focus at the lower pole of the right kidney and 2 echogenic foci measuring up to 1.3 cm at the lower pole of the left kidney may represent nonobstructive calyceal stones. 5. Benign cortical cyst at the lower pole of the left kidney. No follow-up imaging is indicated for this finding. 6. Partially visualized right pleural effusion. Electronically Signed   By: Ileana Roup M.D.   On: 02/11/2022 16:02     Assessment and Plan:   1. PEA arrest with complex admission to include enterococcal bacteremia with MV endocarditis with septic emboli to brain, AKI with oliguria with rising Cr, hypervolemia, encephalopathy, VDRF, thrombocytopenia, anemia, hyponatremia, sacrum wound, PCCM managing delicately, very ill 2. Acute on chronic HFpEF 3. PAF earlier admission with bradycardia now off amiodarone 4. H/o HOCM (baseline, known) 5. Moderate-severe mitral regurgitation by TEE 01/2022 6. Prior severe pulmonary HTN  Medically complex patient. Patient seen with Dr. Johney Frame who also discussed case with Dr. Carlis Abbott. See attestation for comprehensive thoughts.  Risk  Assessment/Risk Scores:        New York Heart Association (NYHA) Functional Class Unable to assess at present  CHA2DS2-VASc Score = 9   This indicates a 12.2% annual risk of stroke. The patient's score is based upon: CHF History: 1 HTN History: 1 Diabetes History: 1 Stroke History: 2 Vascular Disease History: 1 (aortic atherosclerosis) Age Score: 2 Gender Score: 1    For questions or updates, please contact [place] Please consult [email or web address] for contact info under    Signed, Charlie Pitter, PA-C  02/12/2022 4:47 PM  Patient seen and examined and agree with Melina Copa, PA-C as detailed above.  In brief, the patient is a  76 y.o. female with a hx of HOCM, HTN, DM2, HLD, GERD, chronic HFpEF, anemia, morbid obesity, mild MR, mild-moderate MS, severe pulmonary HTN by echo 06/2021 who is being seen for the evaluation of acute on chronic diastolic HF for which Cardiology was consulted.  Patient has had a very complex hospital course. She initially presented after being found altered at home. By the time of EMS arrival, the patient was agonal and pulseless requiring CPR x23mn. She was intubated in the field. Course was further complicated by enterococcus bacteremia with MV endocarditis with septic emboli to the brain, paroxysmal Afib, acute renal failure, hypervolemia, thrombocytopenia, and anemia. Earlier during admission she was was responding to commands and an attempt was made to extubate but she failed and required re-intubation. MRI following re-intubation demonstrated the septic emboli as detailed above. She has not been responsive since that time.   Today, the patient developed worsening hypoxia after attempting to wean her propofol. She ultimately developed refractory hypoxia despite increasing PEEP. She was paralyzed and was being placed in the prone position on our arrival.  From a CV standpoint, she is grossly overloaded with rising Cr in the setting of multiorgan  dysfunction. She has been initiated on lasix gtt by CCM team. She had episode of Afib and was previously on amiodarone which was stopped due to bradycardia. Not on heparin due to septic emboli and thrombocytopenia.   Overall, prognosis is very poor with very limited options and concern she will not make neurologic recovery. She has been excellently managed by the critical care team. This was discussed at length with the patient's family who had several questions/concerns they wished to address.  We have no new recommendations. Given that the family is concerned that they are receiving different information from various providers, will sign off at this time. Please feel free to call back with question or concerns.  GEN: Intubated, sedated, anasarcic   Neck: ETT in place Cardiac: RRR, 2/6 systolic murmur Respiratory: Coarse vent sounds GI: Obese, soft MS: Anasarca Neuro:  Sedated Psych: Unable to assess  Plan: -Agree with the excellent care provided by CCM team -Continue lasix gtt -Not on nodal agents/amio due to bradycardia -Cannot tolerate AC due to septic emboli and thrombocytopenia -Overall prognosis is very poor; this was expressed to the family at length but they are still hopeful for full recovery -Given that the family is overwhelmed by seeing multiple different providers who are providing "different" information, we will sign-off at this time. Please feel free to call with questions or concern.  Over 32mn spent with the family discussing the patient's hospital course. Attempted to emphasize that the patient's prognosis is very poor although they continue to hope for full recovery.   CRITICAL CARE TIME: I have spent a total of 75 minutes with patient reviewing hospital notes, telemetry, EKGs, labs and examining the patient as well as establishing an assessment and plan that was discussed with the patient.  > 50% of time was spent in direct patient care. The patient is critically ill  with multi-organ system failure and requires high complexity decision making for assessment and support, frequent evaluation and titration of therapies, application of advanced monitoring technologies and extensive interpretation of multiple databases.   HGwyndolyn Kaufman MD CHenry

## 2022-02-12 NOTE — Progress Notes (Signed)
Critical ABG results given to Dr Carlis Abbott. Order to increase PEEP to 18 was given.

## 2022-02-12 NOTE — Progress Notes (Signed)
Completed head turn and arm position with the assistance of RN and RT.

## 2022-02-12 NOTE — IPAL (Signed)
  Interdisciplinary Goals of Care Family Meeting   Date carried out: 02/12/2022  Location of the meeting: Bedside  Member's involved: Physician, Bedside Registered Nurse, and Family Member or next of kin  Durable Power of Attorney or acting medical decision maker: 3 sons, daughter    Discussion: We discussed goals of care for [employer] .  I met with Ms. Azizi's children and daughter in law again this evening. We reviewed the events of the day and the complexity of her case. They understand that we are doing everything we can to try to progress her care, but despite these attempts she remains very ill. She is now proned, back on vasopressors, sedated and intermittently under NMB. Tracheostomy is no longer to be done tomorrow due to profound hypoxia. Cardiology has been consulted today and updated them and ID has been called to review antibiotics. We are trying to stimulate her kidneys maximally with a lasix drip due to the severity of her illness. Her prognosis remains guarded and they remain hopeful for a full recovery.  Code status: Full Code  Disposition: Continue current acute care  Time spent for the meeting: 25 min.    Julian Hy, DO  02/12/2022, 6:07 PM

## 2022-02-12 NOTE — Progress Notes (Signed)
eLink Physician-Brief Progress Note Patient Name: LEASHA GOLDBERGER DOB: 1945-10-20 MRN: [id number]   Date of Service  02/12/2022  HPI/Events of Note  PT noted to have only 41m UO so far during the shift.  No hypotesnsive episdoes, maitaining MAP >65 PT with anasarca, review of cumulative I/OS show pt to be net positive 18L   eICU Interventions  Placed order for lasix '40mg'$  IV Will monitor response.  Continue to monitor I/Os, daily weights         Annie Roseboom M DELA CRUZ 02/12/2022, 4:32 AM

## 2022-02-12 NOTE — Procedures (Signed)
Arterial Catheter Insertion Procedure Note  KOLLINS FENTER  [id number]  July 16, 1945  Date:02/12/22  Time:5:20 PM    Provider Performing: Esperanza Sheets T    Procedure: Insertion of Arterial Line [phone]) without US guidance  Indication(s) Blood pressure monitoring and/or need for frequent ABGs  Consent Risks of the procedure as well as the alternatives and risks of each were explained to the patient and/or caregiver.  Consent for the procedure was obtained and is signed in the bedside chart  Anesthesia None   Time Out Verified patient identification, verified procedure, site/side was marked, verified correct patient position, special equipment/implants available, medications/allergies/relevant history reviewed, required imaging and test results available.   Sterile Technique Maximal sterile technique including full sterile barrier drape, hand hygiene, sterile gown, sterile gloves, mask, hair covering, sterile ultrasound probe cover (if used).   Procedure Description Area of catheter insertion was cleaned with chlorhexidine and draped in sterile fashion. Without real-time ultrasound guidance an arterial catheter was placed into the left radial artery.  Appropriate arterial tracings confirmed on monitor.     Complications/Tolerance None; patient tolerated the procedure well.   EBL Minimal   Specimen(s) None

## 2022-02-12 NOTE — Progress Notes (Signed)
Propofol has been off today to allow best neurological exam.She has not yet woken up, but throughout the morning had increasing oxygen requirements with ongoing tachypnea. She developed refractory hypoxia requiring BVM; I was notified by RN & RT that they needed help at bedside to correct her hypoxia. She was able to correct into the 90s with BVM, but would desaturate when reconnected to the vent despite increase PEEP. CXR demonstrated severe bilateral pulm edema, but no pneumothorax. After discussing my concerns about her lungs and overall respiratory drive with her son, he understands why using NMB and heavy sedation is required to stabilize her. Initial dose given through midline, but she continued to breath stack on the vent. BVM and repeat dose of Rocuronium given via R wrist IV that was able to pull back blood. She was more synchronous with MV. With saturations 100%, she was transitioned back to MV-- 32/310/16/100%. Vt had to be decreased due to excessively high Pplat. ABG in 1h. Son at bedside and aware of treatments given.  UOP ~400-500 cc this shift after lasix dose this morning. Will bolus with an additional '120mg'$  now and start an infusion. With such refractory hypoxia, aggressive diuresis is necessary.  Additional cc time: 30 min.  Julian Hy, DO 02/12/22 2:45 PM [facility]

## 2022-02-12 NOTE — Progress Notes (Signed)
Patients family arguing and cursing at bedside with emotions and voices being raised. Resolution was made without interventions from staff and one family member exited room/department at this time.

## 2022-02-12 NOTE — Progress Notes (Signed)
eLink Physician-Brief Progress Note Patient Name: Alyssa Ingram DOB: 11/30/1945 MRN: [id number]   Date of Service  02/12/2022  HPI/Events of Note  ABG post proning shows improved pO2 which is now up to 300.  She remains significantly acidemic, with pH of 7.066  eICU Interventions  Will downtitrate FiO2 to 90% Placed order for 2amps NaHCO3 Will follow serial ABG.         [place] 02/12/2022, 10:20 PM   2:41 AM Reviewed most recent ABG.  pCO2 coming down slowly.  Maintain on current vent settings.  64moe amps bicarb ordered.

## 2022-02-12 NOTE — Progress Notes (Signed)
Turned pts head to left with the assistance of RN and RT x2.

## 2022-02-12 NOTE — Progress Notes (Signed)
Pharmacy Antibiotic Note  Alyssa Ingram is a 76 y.o. female admitted on 02/08/2022 after PEA arrest at home. Pt on Rocephin and Ampicillin for enterococcal endocarditis. Now expanding coverage to include HCAP. Pharmacy has been consulted for Cefepime dosing. Zyvox also started.   ID MD recommended continuing ampicillin and d/c Rocephin for now then using Zyvox and Cefepime.  Plan: Zyvox '600mg'$  IV q12h Cefepime 2gm IV q24h Ampicillin 2gm IV q8h Will continue to f/u renal function, micro data, and pt's clinical condition  Height: 5' (152.4 cm) Weight: 96 kg (211 lb 10.3 oz) IBW/kg (Calculated) : 45.5  Temp (24hrs), Avg:98.3 F (36.8 C), Min:97.9 F (36.6 C), Max:98.7 F (37.1 C)  Recent Labs  Lab 02/08/22 0359 02/09/22 0444 02/10/22 0509 02/11/22 0501 02/11/22 0700 02/11/22 1512 02/12/22 0842  WBC 19.2* 16.9* 20.2*  --  19.8*  --  24.4*  CREATININE 1.45* 1.42* 1.57* 1.82*  --  2.27* 2.61*    Estimated Creatinine Clearance: 19 mL/min (A) (by C-G formula based on SCr of 2.61 mg/dL (H)).    Allergies  Allergen Reactions   Sulfa Antibiotics Hives   Metformin And Related Other (See Comments)    intolerance   Prednisone Swelling    Antimicrobials this admission:  Azithro 9/18 >9/19 Vanco 9/18 > 9/19 Zosyn 9/18 >9/19  Unasyn 9/19>9/22 Ampicillin 9/22>>(11/1) Ceftriaxone 9/22>>10/1 Cefepime 10/1>> Zyvox 10/1>>   Microbiology results:  9/18 BCx x 2 > e faecalis (no vanc resistance detected)  9/18 MRSA - neg 9/20 bld x2: neg 9/23 Resp Cx > neg 10/1 resp MRSA PCR:   Thank you for allowing pharmacy to be a part of this patient's care.  Sherlon Handing, PharmD, BCPS Please see amion for complete clinical pharmacist phone list 02/12/2022 4:22 PM

## 2022-02-12 NOTE — Progress Notes (Signed)
ETT holder changed. Pt noted to have small cut above lip. Mepilex placed to prevent any further breakdown.

## 2022-02-12 NOTE — Progress Notes (Signed)
NAME:  Alyssa Ingram, MRN:  [id number], DOB:  1946-04-25, LOS: 79 ADMISSION DATE:  02/09/2022, CONSULTATION DATE:  01/29/2022 REFERRING MD:  Leonette Monarch - EDP CHIEF COMPLAINT:  PEA arrest  History of Present Illness:  76 year old woman who presented to [facility] ED 9/18 post-PEA arrest. PMHx significant for HTN, HLD, HFpEF, HOCM, asthma, T2DM, GERD, obesity.   History is provided by son who lives with the patient.   She has been feeling poorly, including weakness and malaise for several days.  Chronic leg and foot pain was worse than usual.  This was followed by several days of abdominal discomfort, though no diarrhea or nausea or vomiting.  She was incontinent of stool once which is unusual for her.  9/17 she also developed neck pain.  She usually sleeps laying flat and was able to sleep Saturday night. Since yesterday, they have seen her leaning over the sink and counter bracing herself.    She was feeling worse, becoming weaker and they had decided to take her to the ED, when she started to get worse and more altered.  911 called, and she had completely lost consciousness at that time. When EMS arrived, breathing was agonal and she was pulseless. CPR x 22 min with ROSC, King airway in the field.    Intubated on arrival to ED. Neurologically, she was doing enough to fight the tube that she did receive doses of sedation in ED. Required pressor initiation (Levophed) and broad-spectrum antibiotic coverage with Vanc/Zosyn.  On 9/19, patient was noted to be waking up and following commands. She was extubated 9/20; shortly after extubation she required reintubation for upper airway edema. On 9/21, noted to be less responsive. Underwent MRI Brain 9/22 which showed multiple embolic/septic strokes, raising concern for endocarditis. TEE 9/25 demonstrating highly mobile valvular vegetation on MV c/w endocarditis.  Pertinent Medical History:   Past Medical History:  Diagnosis Date   Abnormal MRI, spine 11/2011    ?discitis L5-S1, s/p CT bx 12/12/11   Allergic rhinitis, cause unspecified 01/31/2013   Arthritis    Asthma    Cervical cancer ([place]) 1980   Chronic diastolic heart failure (HCC)    Diabetic retinopathy ([place])    GERD (gastroesophageal reflux disease)    H/O cardiovascular stress test    Nuclear study in 2011 normal perfusion   HOCM (hypertrophic obstructive cardiomyopathy) ([place])    Echo 12/19/11: Severe LVH, EF 55-65%, dynamic obstruction, wall motion normal, grade 1 diastolic dysfunction, systolic anterior motion of the mitral valve with mild MS and mild MR, mild LAE, PASP 34   Hyperlipidemia    Hypertension    Kidney stones    Obesity    Type II or unspecified type diabetes mellitus without mention of complication, uncontrolled 01/31/2013   Significant Hospital Events: Including procedures, antibiotic start and stop dates in addition to other pertinent events   9/18 Admitted to ICU 9/19 waking up, following commands.  Weaning vasopressors. 9/20 Diuresed, passed SBT and extubated, but reintubated for upper airway edema. 9/21 less responsive on no sedation. MRI, EEG ordered. 9/22  MRI which showed multiple embolic/septic strokes. 9/25 Marginally responsive. TEE completed with +highly mobile MV vegetation c/w endocarditis. 9/26 Grossly unchanged neurologic exam. [place] conversation started with family 9/27 ongoing family discussions 9/28 failed SBT 2/2 significant resp distress, amio stopped given bradycardia  Interim History / Subjective:  No acute events overnight. Propofol off early this morning, has had frequent vent alarms since.   Objective:  Blood pressure 111/63, pulse Marland Kitchen)  48, temperature 98.7 F (37.1 C), temperature source Axillary, resp. rate (!) 25, height 5' (1.524 m), weight 96 kg, SpO2 94 %.    Vent Mode: PRVC FiO2 (%):  [40 %-50 %] 45 % Set Rate:  [26 bmp] 26 bmp Vt Set:  [360 mL] 360 mL PEEP:  [10 cmH20] 10 cmH20 Plateau Pressure:  [22 cmH20-28 cmH20] 26 cmH20    Intake/Output Summary (Last 24 hours) at 02/12/2022 0718 Last data filed at 02/12/2022 0700 Gross per 24 hour  Intake 7113.46 ml  Output 455 ml  Net 6658.46 ml    Filed Weights   02/09/22 0500 02/11/22 0500 02/12/22 0500  Weight: 88 kg 93.3 kg 96 kg   Physical Examination: General:  critically ill appearing woman lying in bed in NAD HEENT: [place]/AT, eyes anicteric Neuro: examined on no sedation-- RASS -5, no withdrawal in UE or response to trapezius squeeze. Withdrawing great toe to nailbed pressure. Cough with tracheal suctioning, no response to pharyngeal suctioning. Intact pupillary, corneal, and doll's eyes reflexes CV: bradycardic, reg rhyth PULM:  Tachypneic, breathing above the vent. Thick tan sputum. No rhonchi. GI: obese, soft, NT Extremities: brawny LE edema with chronic stasis dermatitis skin changes- similar to previous. ++edema in all extremities. 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  Hypervolemia   - AM labs pending -high dose lasix + albumin today - closely monitor renal function, strict I/os -renally dose meds, avoid nephrotoxic meds - foley catheter for strict  I/Os  Acute encephalopathy- due to septic cerebral infarcts above. Was awake and extubated after cardiac arrest. - minimize sedation as able; due to neurogenic breathing pattern has required sedation intermittently to prevent potential lung damage from severe vent dyssynchrony -family are hopeful for full recovery and want all supportive care measures   Acute respiratory failure with hypoxia Concern for developing VAP History of asthma, no current wheezing -CXR today -trach aspirate, MRSA nares collected today -may need to escalate antibiotics-- waiting on CBC & CXR since no fever -LTVV -VAP prevention protocol -PAD protocol for sedation; holding unless hemodynamically significant dyssynchrony or risk of lung damage -planning for tracheostomy Monday; family has requested they be able to come pray with her before this procedure -con't duonebs Q6h + albuterol PRN  Thrombocytopenia, likely 2/2 antibiotics or sepsis. 4T score=4, intermediate probability. -HIT ab pending -con't to monitor -hold chemical DVT prophylaxis due to bleeding risk  Anemia -transfuse for Hb <7 or hemodynamically significant bleeding  Hyponatremia - waiting on AM BMP, may need to further decrease FWF -avoid hypotonic solutions  DM2 with Hyperglycemia- stable -SSI PRN -goal BG 140-180   HOCM- not issue at present  Stage 1 DTI sacrum -wound care  Family wishes for all supportive care measures. See IPAL &  palliative care notes from 9/30.  Son updated at bedside this morning.   Best Practice: (right click and "Reselect all SmartList Selections" daily)   Diet/type: tube feeds DVT prophylaxis: SCD GI prophylaxis: PPI Lines: line holiday Foley:  Foley Code Status:  full code Last date of multidisciplinary goals of care discussion [9/30]   This patient is critically ill with multiple organ system failure which requires frequent high complexity decision making, assessment, support, evaluation, and  titration of therapies. This was completed through the application of advanced monitoring technologies and extensive interpretation of multiple databases. During this encounter critical care time was devoted to patient care services described in this note for 44 minutes.  Julian Hy, DO 02/12/22 8:39 AM [facility]

## 2022-02-12 DEATH — deceased

## 2022-02-13 ENCOUNTER — Inpatient Hospital Stay (HOSPITAL_COMMUNITY): Payer: Medicare Other

## 2022-02-13 DIAGNOSIS — B952 Enterococcus as the cause of diseases classified elsewhere: Secondary | ICD-10-CM | POA: Diagnosis not present

## 2022-02-13 DIAGNOSIS — I330 Acute and subacute infective endocarditis: Secondary | ICD-10-CM | POA: Diagnosis not present

## 2022-02-13 DIAGNOSIS — R579 Shock, unspecified: Secondary | ICD-10-CM

## 2022-02-13 DIAGNOSIS — R7881 Bacteremia: Secondary | ICD-10-CM | POA: Diagnosis not present

## 2022-02-13 DIAGNOSIS — J80 Acute respiratory distress syndrome: Secondary | ICD-10-CM | POA: Diagnosis not present

## 2022-02-13 DIAGNOSIS — I469 Cardiac arrest, cause unspecified: Secondary | ICD-10-CM | POA: Diagnosis not present

## 2022-02-13 LAB — POCT I-STAT 7, (LYTES, BLD GAS, ICA,H+H)
Acid-base deficit: 10 mmol/L — ABNORMAL HIGH (ref 0.0–2.0)
Acid-base deficit: 7 mmol/L — ABNORMAL HIGH (ref 0.0–2.0)
Acid-base deficit: 7 mmol/L — ABNORMAL HIGH (ref 0.0–2.0)
Bicarbonate: 19.7 mmol/L — ABNORMAL LOW (ref 20.0–28.0)
Bicarbonate: 23 mmol/L (ref 20.0–28.0)
Bicarbonate: 22 mmol/L (ref 20.0–28.0)
Calcium, Ion: 1.12 mmol/L — ABNORMAL LOW (ref 1.15–1.40)
Calcium, Ion: 1.09 mmol/L — ABNORMAL LOW (ref 1.15–1.40)
Calcium, Ion: 1.04 mmol/L — ABNORMAL LOW (ref 1.15–1.40)
HCT: 27 % — ABNORMAL LOW (ref 36.0–46.0)
HCT: 28 % — ABNORMAL LOW (ref 36.0–46.0)
HCT: 27 % — ABNORMAL LOW (ref 36.0–46.0)
Hemoglobin: 9.2 g/dL — ABNORMAL LOW (ref 12.0–15.0)
Hemoglobin: 9.5 g/dL — ABNORMAL LOW (ref 12.0–15.0)
Hemoglobin: 9.2 g/dL — ABNORMAL LOW (ref 12.0–15.0)
O2 Saturation: 95 %
O2 Saturation: 100 %
O2 Saturation: 100 %
Patient temperature: 98
Patient temperature: 99.6
Patient temperature: 96.4
Potassium: 5.2 mmol/L — ABNORMAL HIGH (ref 3.5–5.1)
Potassium: 5.2 mmol/L — ABNORMAL HIGH (ref 3.5–5.1)
Potassium: 4.8 mmol/L (ref 3.5–5.1)
Sodium: 132 mmol/L — ABNORMAL LOW (ref 135–145)
Sodium: 133 mmol/L — ABNORMAL LOW (ref 135–145)
Sodium: 132 mmol/L — ABNORMAL LOW (ref 135–145)
TCO2: 22 mmol/L (ref 22–32)
TCO2: 25 mmol/L (ref 22–32)
TCO2: 24 mmol/L (ref 22–32)
pCO2 arterial: 65 mmHg — ABNORMAL HIGH (ref 32–48)
pCO2 arterial: 78.9 mmHg (ref 32–48)
pCO2 arterial: 59 mmHg — ABNORMAL HIGH (ref 32–48)
pH, Arterial: 7.088 — CL (ref 7.35–7.45)
pH, Arterial: 7.076 — CL (ref 7.35–7.45)
pH, Arterial: 7.172 — CL (ref 7.35–7.45)
pO2, Arterial: 102 mmHg (ref 83–108)
pO2, Arterial: 292 mmHg — ABNORMAL HIGH (ref 83–108)
pO2, Arterial: 274 mmHg — ABNORMAL HIGH (ref 83–108)

## 2022-02-13 LAB — BASIC METABOLIC PANEL
Anion gap: 17 — ABNORMAL HIGH (ref 5–15)
Anion gap: 17 — ABNORMAL HIGH (ref 5–15)
BUN: 114 mg/dL — ABNORMAL HIGH (ref 8–23)
BUN: 118 mg/dL — ABNORMAL HIGH (ref 8–23)
CO2: 19 mmol/L — ABNORMAL LOW (ref 22–32)
CO2: 20 mmol/L — ABNORMAL LOW (ref 22–32)
Calcium: 8.4 mg/dL — ABNORMAL LOW (ref 8.9–10.3)
Calcium: 8.2 mg/dL — ABNORMAL LOW (ref 8.9–10.3)
Chloride: 99 mmol/L (ref 98–111)
Chloride: 97 mmol/L — ABNORMAL LOW (ref 98–111)
Creatinine, Ser: 3.15 mg/dL — ABNORMAL HIGH (ref 0.44–1.00)
Creatinine, Ser: 3.34 mg/dL — ABNORMAL HIGH (ref 0.44–1.00)
GFR, Estimated: 15 mL/min — ABNORMAL LOW (ref >60)
GFR, Estimated: 14 mL/min — ABNORMAL LOW (ref >60)
Glucose, Bld: 182 mg/dL — ABNORMAL HIGH (ref 70–99)
Glucose, Bld: 212 mg/dL — ABNORMAL HIGH (ref 70–99)
Potassium: 5.3 mmol/L — ABNORMAL HIGH (ref 3.5–5.1)
Potassium: 5.2 mmol/L — ABNORMAL HIGH (ref 3.5–5.1)
Sodium: 135 mmol/L (ref 135–145)
Sodium: 134 mmol/L — ABNORMAL LOW (ref 135–145)

## 2022-02-13 LAB — CBC WITH DIFFERENTIAL/PLATELET
Abs Immature Granulocytes: 2.03 10*3/uL — ABNORMAL HIGH (ref 0.00–0.07)
Basophils Absolute: 0.2 10*3/uL — ABNORMAL HIGH (ref 0.0–0.1)
Basophils Relative: 1 %
Eosinophils Absolute: 0.1 10*3/uL (ref 0.0–0.5)
Eosinophils Relative: 0 %
HCT: 25 % — ABNORMAL LOW (ref 36.0–46.0)
Hemoglobin: 7.5 g/dL — ABNORMAL LOW (ref 12.0–15.0)
Immature Granulocytes: 6 %
Lymphocytes Relative: 4 %
Lymphs Abs: 1.4 10*3/uL (ref 0.7–4.0)
MCH: 28 pg (ref 26.0–34.0)
MCHC: 30 g/dL (ref 30.0–36.0)
MCV: 93.3 fL (ref 80.0–100.0)
Monocytes Absolute: 1.4 10*3/uL — ABNORMAL HIGH (ref 0.1–1.0)
Monocytes Relative: 4 %
Neutro Abs: 31.5 10*3/uL — ABNORMAL HIGH (ref 1.7–7.7)
Neutrophils Relative %: 85 %
Platelets: 63 10*3/uL — ABNORMAL LOW (ref 150–400)
RBC: 2.68 MIL/uL — ABNORMAL LOW (ref 3.87–5.11)
RDW: 25.1 % — ABNORMAL HIGH (ref 11.5–15.5)
WBC: 36.7 10*3/uL — ABNORMAL HIGH (ref 4.0–10.5)
nRBC: 8.7 % — ABNORMAL HIGH (ref 0.0–0.2)

## 2022-02-13 LAB — LACTIC ACID, PLASMA: Lactic Acid, Venous: 1.1 mmol/L (ref 0.5–1.9)

## 2022-02-13 LAB — HEPATIC FUNCTION PANEL
ALT: 15 U/L (ref 0–44)
AST: 28 U/L (ref 15–41)
Albumin: 2.7 g/dL — ABNORMAL LOW (ref 3.5–5.0)
Alkaline Phosphatase: 79 U/L (ref 38–126)
Bilirubin, Direct: 0.5 mg/dL — ABNORMAL HIGH (ref 0.0–0.2)
Indirect Bilirubin: 0.8 mg/dL (ref 0.3–0.9)
Total Bilirubin: 1.3 mg/dL — ABNORMAL HIGH (ref 0.3–1.2)
Total Protein: 7.2 g/dL (ref 6.5–8.1)

## 2022-02-13 LAB — PROTIME-INR
INR: 1.4 — ABNORMAL HIGH (ref 0.8–1.2)
Prothrombin Time: 17.4 seconds — ABNORMAL HIGH (ref 11.4–15.2)

## 2022-02-13 LAB — MAGNESIUM: Magnesium: 2.6 mg/dL — ABNORMAL HIGH (ref 1.7–2.4)

## 2022-02-13 LAB — HEPARIN INDUCED PLATELET AB (HIT ANTIBODY): Heparin Induced Plt Ab: 1.868 OD — ABNORMAL HIGH (ref 0.000–0.400)

## 2022-02-13 LAB — GLUCOSE, CAPILLARY
Glucose-Capillary: 165 mg/dL — ABNORMAL HIGH (ref 70–99)
Glucose-Capillary: 173 mg/dL — ABNORMAL HIGH (ref 70–99)
Glucose-Capillary: 218 mg/dL — ABNORMAL HIGH (ref 70–99)
Glucose-Capillary: 153 mg/dL — ABNORMAL HIGH (ref 70–99)
Glucose-Capillary: 171 mg/dL — ABNORMAL HIGH (ref 70–99)

## 2022-02-13 LAB — APTT: aPTT: 39 seconds — ABNORMAL HIGH (ref 24–36)

## 2022-02-13 LAB — PHOSPHORUS: Phosphorus: >30 mg/dL — ABNORMAL HIGH (ref 2.5–4.6)

## 2022-02-13 MED ORDER — NOREPINEPHRINE 16 MG/250ML-% IV SOLN
0.0000 ug/min | INTRAVENOUS | Status: DC
  Administered 2022-02-13: 31 ug/min via INTRAVENOUS
  Administered 2022-02-13: 40 ug/min via INTRAVENOUS
  Administered 2022-02-14: 18 ug/min via INTRAVENOUS
  Administered 2022-02-14: 26.027 ug/min via INTRAVENOUS
  Administered 2022-02-15: 14 ug/min via INTRAVENOUS
  Administered 2022-02-16: 9 ug/min via INTRAVENOUS
  Administered 2022-02-17: 10 ug/min via INTRAVENOUS
  Administered 2022-02-19: 40 ug/min via INTRAVENOUS
  Administered 2022-02-19: 20 ug/min via INTRAVENOUS
  Filled 2022-02-13 (×10): qty 250

## 2022-02-13 MED ORDER — SODIUM BICARBONATE 650 MG PO TABS
1300.0000 mg | ORAL_TABLET | Freq: Three times a day (TID) | ORAL | Status: DC
  Administered 2022-02-13 – 2022-02-16 (×10): 1300 mg
  Filled 2022-02-13 (×10): qty 2

## 2022-02-13 MED ORDER — SODIUM BICARBONATE 8.4 % IV SOLN
100.0000 meq | Freq: Once | INTRAVENOUS | Status: AC
  Administered 2022-02-13: 100 meq via INTRAVENOUS
  Filled 2022-02-13: qty 50

## 2022-02-13 MED ORDER — "THROMBI-PAD 3""X3"" EX PADS"
1.0000 | MEDICATED_PAD | Freq: Once | CUTANEOUS | Status: DC
  Filled 2022-02-13: qty 1

## 2022-02-13 MED ORDER — VASOPRESSIN 20 UNITS/100 ML INFUSION FOR SHOCK
0.0000 [IU]/min | INTRAVENOUS | Status: DC
  Administered 2022-02-13 – 2022-02-19 (×17): 0.03 [IU]/min via INTRAVENOUS
  Filled 2022-02-13 (×16): qty 100

## 2022-02-13 MED ORDER — SODIUM BICARBONATE 8.4 % IV SOLN
100.0000 meq | Freq: Once | INTRAVENOUS | Status: AC
  Administered 2022-02-13: 100 meq via INTRAVENOUS
  Filled 2022-02-13: qty 100

## 2022-02-13 MED ORDER — HYDROCORTISONE SOD SUC (PF) 100 MG IJ SOLR
100.0000 mg | Freq: Three times a day (TID) | INTRAMUSCULAR | Status: AC
  Administered 2022-02-13 – 2022-02-17 (×15): 100 mg via INTRAVENOUS
  Filled 2022-02-13 (×16): qty 2

## 2022-02-13 MED ORDER — PHENTOLAMINE MESYLATE 5 MG IJ SOLR
5.0000 mg | Freq: Once | INTRAMUSCULAR | Status: AC
  Administered 2022-02-13: 5 mg via SUBCUTANEOUS
  Filled 2022-02-13: qty 5

## 2022-02-13 MED ORDER — PROSOURCE TF20 ENFIT COMPATIBL EN LIQD
60.0000 mL | Freq: Two times a day (BID) | ENTERAL | Status: DC
  Administered 2022-02-13 – 2022-02-19 (×13): 60 mL
  Filled 2022-02-13 (×13): qty 60

## 2022-02-13 MED ORDER — VITAL 1.5 CAL PO LIQD
1000.0000 mL | ORAL | Status: DC
  Administered 2022-02-13 – 2022-02-19 (×5): 1000 mL

## 2022-02-13 MED ORDER — PHENYLEPHRINE HCL-NACL 20-0.9 MG/250ML-% IV SOLN
0.0000 ug/min | INTRAVENOUS | Status: DC
  Administered 2022-02-13: 20 ug/min via INTRAVENOUS
  Filled 2022-02-13: qty 250

## 2022-02-13 MED ORDER — PATIROMER SORBITEX CALCIUM 8.4 G PO PACK
8.4000 g | PACK | Freq: Three times a day (TID) | ORAL | Status: DC
  Administered 2022-02-13 – 2022-02-14 (×2): 8.4 g via ORAL
  Filled 2022-02-13 (×5): qty 1

## 2022-02-13 NOTE — Progress Notes (Signed)
Initial Nutrition Assessment  DOCUMENTATION CODES:   Not applicable  INTERVENTION:   Tube Feeding via Cortrak: Continue TF at goal rate even when in Prone Position (Reverse Trendelenburg >10 degrees) Vital 1.5 at 40 ml/hr Pro-Source TF20 60 mL BID This provides 1600 kcals, 105 g of protein and 730 mL of free water  Additional calories from propofol  D/C Liquid MVI given worsening renal failure  NUTRITION DIAGNOSIS:   Inadequate oral intake related to inability to eat as evidenced by NPO status.  Being addressed via TF   GOAL:   Provide needs based on ASPEN/SCCM guidelines  Progressing  MONITOR:   Vent status, Labs, I & O's, Weight trends  REASON FOR ASSESSMENT:   Consult Enteral/tube feeding initiation and management  ASSESSMENT:   Pt with hx of GERD, HTN, HLD, CHF, DM type 2, and hx cervical cancer admitted after suffering cardiac arrest at home. Family began CPR prior to EMS arrival. 22 minutes before ROSC was obtained.  09/18 Admitted, intubated 09/21 Extubated, reintubated ~1 hour later, cortrak tube placed (gastric fundus) 09/22 MRI showed multiple embolic/septic strokes 95/63 TEE showed  large vegetation on anterior mitral leaflet 10/01 Prone for ?ARDS  Pt remains full code Pt remains on vent support, trach on hold due to medical instability Back on pressors-requiring levophed 40, vasopressin 0.03, neosynephrine. MAP >65 Worsening AKI, oliguric despite lasix gtt Noted mild hyperkalemia which resolved with treatment  Pt requiring prone positioning yesterday. TF placed on hold. Currently in Supine position but plan to return to Prone position at some point. TF can continue when prone as long as bed maintained in reverse trendelenburg position >10 degrees.   Labs: sodium 132 (L), potassium 4.8 (wdl), phosphorus >30, BUN 114, Creatinine 3.15 Meds: ss novolog, lasix gtt, sodium bicarb tablet  Diet Order:   Diet Order             Diet NPO time specified   Diet effective now                   EDUCATION NEEDS:   Not appropriate for education at this time  Skin:  Skin Assessment: Reviewed RN Assessment (sacrum stage 1)  Last BM:  10/01  Height:   Ht Readings from Last 1 Encounters:  02/09/22 5' (1.524 m)    Weight:   Wt Readings from Last 1 Encounters:  02/13/22 99.7 kg    Ideal Body Weight:  45.5 kg  BMI:  Body mass index is 42.93 kg/m.  Estimated Nutritional Needs:   Kcal:  1500-1700 kcal/d  Protein:  90-115 g/d  Fluid:  1.5L/d   Kerman Passey MS, RDN, LDN, CNSC Registered Dietitian 3 Clinical Nutrition RD Pager and On-Call Pager Number Located in [place]

## 2022-02-13 NOTE — Progress Notes (Signed)
eLink Physician-Brief Progress Note Patient Name: Alyssa Ingram DOB: [birth year] MRN: [id number]   Date of Service  02/13/2022  HPI/Events of Note  Pt on max dose levophed at this time  eICU Interventions  Will add vasopressin, stress dose steroids for now.  If persistently hypotensive, will plan to add phenylephrine as a third pressor.         [place] 02/13/2022, 4:14 AM

## 2022-02-13 NOTE — Progress Notes (Signed)
Patient's was arm noted to be purple. Pulses were present with doppler in the hand. CCM was present at the bedside & was made aware.

## 2022-02-13 NOTE — Progress Notes (Addendum)
VAST consult received to assess current midline and place USGIV for pressors. Upon arrival at bedside, patient in prone position. Patient's right arm assessed; extended dwell PIV in right anterior forearm; some swelling to entire extremity noted including fingers; line appears to be infusing without difficulty. Patient's left arm assessed; entire extremity with extreme edema, noting upper arm to be at least 3 times the size of her right arm. Midline without blood return. Advised ICU RN and PA that midline appears infiltrated and needs to be discontinued. Unable to assess PIV in lower left arm d/t edema and positioning of arm. Assessed patient's right arm utilizing ultrasound; unable to locate any vessels appropriate for USGIV. Unit RN and PA advised.

## 2022-02-13 NOTE — Procedures (Signed)
Central Venous Catheter Insertion Procedure Note  Alyssa Ingram  [id number]  [birth year]  Date:02/13/22  Time:10:07 AM   Provider Performing:Devony Mcgrady R Ivy Puryear   Procedure: Insertion of Non-tunneled Central Venous [phone]) with US guidance ([id number])   Indication(s) Medication administration  Consent Risks of the procedure as well as the alternatives and risks of each were explained to the patient and/or caregiver.  Consent for the procedure was obtained and is signed in the bedside chart  Anesthesia Topical only with 1% lidocaine   Timeout Verified patient identification, verified procedure, site/side was marked, verified correct patient position, special equipment/implants available, medications/allergies/relevant history reviewed, required imaging and test results available.  Sterile Technique Maximal sterile technique including full sterile barrier drape, hand hygiene, sterile gown, sterile gloves, mask, hair covering, sterile ultrasound probe cover (if used).  Procedure Description Area of catheter insertion was cleaned with chlorhexidine and draped in sterile fashion.  With real-time ultrasound guidance a central venous catheter was placed into the right internal jugular vein. Nonpulsatile blood flow and easy flushing noted in all ports.  The catheter was sutured in place and sterile dressing applied.  Complications/Tolerance None; patient tolerated the procedure well. Chest X-ray is ordered to verify placement for internal jugular or subclavian cannulation.   Chest x-ray is not ordered for femoral cannulation.  EBL Minimal  Specimen(s) None  Alyssa Carpen Jai Bear, PA-C

## 2022-02-13 NOTE — Progress Notes (Signed)
eLink Physician-Brief Progress Note Patient Name: Alyssa Ingram DOB: 04-10-1946 MRN: [id number]   Date of Service  02/13/2022  HPI/Events of Note  ABG resulted: 7.076/79/292 Pt proned, paralyzed On PRVC rate 35, TV 310 (33m/kg PBW), FiO2 90%, PEEP 18 Peak pressures 32, plateau pressures 29-30  eICU Interventions  PT on max RR, cannot go up further on TV as it would increase risk of barotrauma.  Would tolerate hypercapnea.  Ordered 1040m Bicarb push for now.   Overall prognosis poor.         ANSt. John0/06/2021, 6:59 AM

## 2022-02-13 NOTE — Progress Notes (Signed)
Completed head turn and arm position with the assistance of RN x2 and RT x2.

## 2022-02-13 NOTE — Procedures (Signed)
Arterial Catheter Insertion Procedure Note  ELMINA HENDEL  [id number]  [birth year]  Date:02/13/22  Time:9:05 PM    Provider Performing: Ulice Dash    Procedure: Insertion of Arterial Line [phone]) without US guidance  Indication(s) Blood pressure monitoring and/or need for frequent ABGs  Consent Unable to obtain consent due to emergent nature of procedure.  Anesthesia None   Time Out Verified patient identification, verified procedure, site/side was marked, verified correct patient position, special equipment/implants available, medications/allergies/relevant history reviewed, required imaging and test results available.   Sterile Technique Maximal sterile technique including full sterile barrier drape, hand hygiene, sterile gown, sterile gloves, mask, hair covering, sterile ultrasound probe cover (if used).   Procedure Description Area of catheter insertion was cleaned with chlorhexidine and draped in sterile fashion. Without real-time ultrasound guidance an arterial catheter was placed into the right radial artery.  Appropriate arterial tracings confirmed on monitor.     Complications/Tolerance None; patient tolerated the procedure well.   EBL Minimal   Specimen(s) None

## 2022-02-13 NOTE — Inpatient Diabetes Management (Addendum)
Inpatient Diabetes Program Recommendations  AACE/ADA: New Consensus Statement on Inpatient Glycemic Control (2015)  Target Ranges:  Prepandial:   less than 140 mg/dL      Peak postprandial:   less than 180 mg/dL (1-2 hours)      Critically ill patients:  140 - 180 mg/dL   Lab Results  Component Value Date   GLUCAP 218 (H) 02/13/2022   HGBA1C 7.2 (H) 01/21/2022    Review of Glycemic Control  Latest Reference Range & Units 02/12/22 23:51 02/13/22 04:10 02/13/22 08:19 02/13/22 12:00  Glucose-Capillary 70 - 99 mg/dL 189 (H) 165 (H) 173 (H) 218 (H)  (H): Data is abnormally high Diabetes history: Type 2 DM Outpatient Diabetes medications: Glipizide 2.5 mg QD Current orders for Inpatient glycemic control: Novolog 0-15 units Q4H Solucortef 100 mg Q8H  Inpatient Diabetes Program Recommendations:    If consistent with goals of care, consider adding Novolog 2 units Q4H for tube feed coverage (to be stopped or held in the event tube feeds held).   Thanks, Bronson Curb, MSN, RNC-OB Diabetes Coordinator [phone] (8a-5p)

## 2022-02-13 NOTE — Progress Notes (Signed)
NAME:  Alyssa Ingram, MRN:  [id number], DOB:  04/05/1946, LOS: 50 ADMISSION DATE:  01/27/2022, CONSULTATION DATE:  01/26/2022 REFERRING MD:  Leonette Monarch - EDP CHIEF COMPLAINT:  PEA arrest  History of Present Illness:  76 year old woman who presented to [facility] ED 9/18 post-PEA arrest. PMHx significant for HTN, HLD, HFpEF, HOCM, asthma, T2DM, GERD, obesity.   History is provided by son who lives with the patient.   She has been feeling poorly, including weakness and malaise for several days.  Chronic leg and foot pain was worse than usual.  This was followed by several days of abdominal discomfort, though no diarrhea or nausea or vomiting.  She was incontinent of stool once which is unusual for her.  9/17 she also developed neck pain.  She usually sleeps laying flat and was able to sleep Saturday night. Since yesterday, they have seen her leaning over the sink and counter bracing herself.    She was feeling worse, becoming weaker and they had decided to take her to the ED, when she started to get worse and more altered.  911 called, and she had completely lost consciousness at that time. When EMS arrived, breathing was agonal and she was pulseless. CPR x 22 min with ROSC, King airway in the field.    Intubated on arrival to ED. Neurologically, she was doing enough to fight the tube that she did receive doses of sedation in ED. Required pressor initiation (Levophed) and broad-spectrum antibiotic coverage with Vanc/Zosyn.  On 9/19, patient was noted to be waking up and following commands. She was extubated 9/20; shortly after extubation she required reintubation for upper airway edema. On 9/21, noted to be less responsive. Underwent MRI Brain 9/22 which showed multiple embolic/septic strokes, raising concern for endocarditis. TEE 9/25 demonstrating highly mobile valvular vegetation on MV c/w endocarditis.  Pertinent Medical History:   Past Medical History:  Diagnosis Date   Abnormal MRI, spine 11/2011    ?discitis L5-S1, s/p CT bx 12/12/11   Allergic rhinitis, cause unspecified 01/31/2013   Arthritis    Asthma    Cervical cancer ([place]) 1980   Chronic diastolic heart failure (HCC)    Diabetic retinopathy ([place])    GERD (gastroesophageal reflux disease)    H/O cardiovascular stress test    Nuclear study in 2011 normal perfusion   HOCM (hypertrophic obstructive cardiomyopathy) ([place])    Hyperlipidemia    Hypertension    Kidney stones    Mitral regurgitation    Mitral stenosis    Obesity    Premature atrial contractions    PVC's (premature ventricular contractions)    Type II or unspecified type diabetes mellitus without mention of complication, uncontrolled 01/31/2013   Significant Hospital Events: Including procedures, antibiotic start and stop dates in addition to other pertinent events   9/18 Admitted to ICU 9/19 waking up, following commands.  Weaning vasopressors. 9/20 Diuresed, passed SBT and extubated, but reintubated for upper airway edema. 9/21 less responsive on no sedation. MRI, EEG ordered. 9/22  MRI which showed multiple embolic/septic strokes. 9/25 Marginally responsive. TEE completed with +highly mobile MV vegetation c/w endocarditis. 9/26 Grossly unchanged neurologic exam. [place] conversation started with family 9/27 ongoing family discussions 9/28 failed SBT 2/2 significant resp distress, amio stopped given bradycardia 10/1 proned and paralyzed, IV's infiltrated, on three pressors, worsening renal failure  Interim History / Subjective:  Respiratory acidosis on max RR, on low tidal volume ventilation and proned/paralyzed, worsening renal failure   Objective:  Blood pressure (!) 77/49,  pulse 61, temperature 99.6 F (37.6 C), temperature source Axillary, resp. rate (!) 35, height 5' (1.524 m), weight 99.7 kg, SpO2 98 %.    Vent Mode: PRVC FiO2 (%):  [60 %-100 %] 90 % Set Rate:  [26 bmp-35 bmp] 35 bmp Vt Set:  [310 mL-360 mL] 310 mL PEEP:  [10 cmH20-18 cmH20] 18  cmH20 Plateau Pressure:  [20 cmH20-33 cmH20] 29 cmH20   Intake/Output Summary (Last 24 hours) at 02/13/2022 1009 Last data filed at 02/12/2022 1900 Gross per 24 hour  Intake 1092.69 ml  Output 470 ml  Net 622.69 ml    Filed Weights   02/11/22 0500 02/12/22 0500 02/13/22 0500  Weight: 93.3 kg 96 kg 99.7 kg    General:  critically ill F, intubated and sedated HEENT: MM pink/moist, dependent facial edema Neuro: examined on sedation, RASS-5, eyes slightly open, pupils equal CV: s1s2 bradycardic, no m/r/g PULM:  paralyzed and initially proned, no rhonchi or wheezing GI: soft, non-distedened Extremities: warm/dry, 2+  edema throughout  Skin: no rashes or lesions, chronic stasis dermatitis   Creatinine rising 3.15 Phos >30 Platelets 63 WBC 36 rising 500cc uop   US renal> no hydronephrosis, changes consistent with medical renal disease, peripheral stones, benign cortical cyst, R pleural effusion  Assessment & Plan:   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 Septic Shock -rapidly increasing pressor requirement, now on Levophed, Vaso and Neo, got 4 amps bicarb overnight  -check repeat ABG and lactic acid -continue stress dose steroids -CVC replaced -con't prolonged couse of antibiotics- ampicillin and high-dose ceftraixone until 11/1 for 6 week course; appreciate ID's management -PIVs; on line holiday currently  Paroxysmal Afib - AC on hold for 2 weeks given inflammatory milieu in CNS and high risk for hemorrhagic conversion; can resume 10/6 - amiodarone; con't holding Bblocker, remains bradycardic   HTN -d/c norvasc on pressors  Prolonged OHCA-  had initially recovered, was talking following commands prior to showering of emboli  -con't supportive care -sedation wean yesterday and was not responsive, too unstable for SBT/SAT today  AKI with oliguria, UOP started to drop off 9/29.  Hypervolemia   500cc UOP yesterday despite lasix  gtt Worsening creatinine  -concerning that overall picture of shock and organ failure is rapidly progressing, not a dialysis candidate  -renally dose meds, avoid nephrotoxic meds - foley catheter for strict I/Os  Acute encephalopathy-  due to septic cerebral infarcts above. Was awake and extubated after cardiac arrest. -continue sedation for proning for ARDS -family are hopeful for full recovery and want all supportive care measures   Acute respiratory failure with hypoxia Concern for developing VAP History of asthma, no current wheezing -CXR today -trach aspirate, MRSA nares collected today -may need to escalate antibiotics-- waiting on CBC & CXR since no fever -LTVV -VAP prevention protocol -PAD protocol for sedation -initial plan for trach today, not hypoxic and unstable -con't duonebs Q6h + albuterol PRN  Thrombocytopenia, likely 2/2 antibiotics or sepsis. 4T score=4, intermediate probability. -HIT ab pending -con't to monitor -hold chemical DVT prophylaxis due to bleeding risk  Anemia -transfuse for Hb <7 or hemodynamically significant bleeding  Hyponatremia resoived  DM2 with Hyperglycemia- stable -SSI PRN -goal BG 140-180   Stage 1 DTI sacrum -wound care    Long talk with family this morning, they want all measures, feel strongly that God may still heal pt.   Best Practice: (right click and "Reselect all SmartList Selections" daily)   Diet/type: tube feeds  DVT prophylaxis: SCD GI prophylaxis: PPI Lines: line holiday Foley:  Foley Code Status:  full code Last date of multidisciplinary goals of care discussion [9/30]   CRITICAL CARE Performed by: Otilio Carpen Dhanvin Szeto   Total critical care time: 50  minutes  Critical care time was exclusive of separately billable procedures and treating other patients.  Critical care was necessary to treat or prevent imminent or life-threatening deterioration.  Critical care was time spent personally by me on the  following activities: development of treatment plan with patient and/or surrogate as well as nursing, discussions with consultants, evaluation of patient's response to treatment, examination of patient, obtaining history from patient or surrogate, ordering and performing treatments and interventions, ordering and review of laboratory studies, ordering and review of radiographic studies, pulse oximetry and re-evaluation of patient's condition.   Otilio Carpen [place], PA-C [facility] See Amion for pager If no response to pager , please call [phone] until 7pm After 7:00 pm call Elink  [phone]

## 2022-02-13 NOTE — Progress Notes (Signed)
CCM notified due to A-line not drawing blood back, not reading correctly per RN & fingers turing more purple / black. RT still able to doppler pulses in the hand at this time. CCM will come and replace A-line per [place], [place].

## 2022-02-13 NOTE — Progress Notes (Signed)
Patient was supine at 08:40 without any complications. ETT was secured with commercial tube holder at 22 at the lip. Skin breakdown was noted above patient's upper lip when mepilex was removed. A new mepilex was placed over the patient's upper lip & RN was made aware.

## 2022-02-13 NOTE — Consult Note (Signed)
Reason for Consult: Acute kidney injury Referring Physician: June Leap, DO (CCM)  HPI:  76 year old woman with past medical history significant for type 2 diabetes mellitus, hypertension, obesity, congestive heart failure with preserved ejection fraction with HOCM, dyslipidemia, bronchial asthma, obesity and normal renal function at baseline (creatinine ranging 0.8-1.0 prior to admission).  She was brought into the emergency room by EMS after suffering PEA arrest with 22 minutes of CPR resulting in ROSC/King airway in the field.  Prior to that she had been feeling poorly with generalized weakness and malaise for several days, abdominal discomfort and worsening leg/back and neck pain.  Subsequent work-up revealed evidence of acute kidney injury with a creatinine of 1.5 on admission.  She underwent additional work-up that showed her suffering from enterococcal bacteremia and mitral valve endocarditis with septic emboli to the brain.  She has been on pressor dependent septic shock (pressors now weaned down to monotherapy with Levophed) and has evidence of multiorgan failure including anuric acute kidney injury with creatinine up to 3.1, potassium 5.3 and bicarbonate of 19 with azotemia/BUN 114.  The patient remains critically ill with multiple organ system failure and there have been extensive discussions with her family regarding the poor prognosis however, they would like to continue full scope of care without any de-escalation or consideration of comfort measures.  Past Medical History:  Diagnosis Date   Abnormal MRI, spine 11/2011   ?discitis L5-S1, s/p CT bx 12/12/11   Allergic rhinitis, cause unspecified 01/31/2013   Arthritis    Asthma    Cervical cancer ([place]) 1980   Chronic diastolic heart failure (HCC)    Diabetic retinopathy (HCC)    GERD (gastroesophageal reflux disease)    H/O cardiovascular stress test    Nuclear study in 2011 normal perfusion   HOCM (hypertrophic obstructive  cardiomyopathy) ([place])    Hyperlipidemia    Hypertension    Kidney stones    Mitral regurgitation    Mitral stenosis    Obesity    Premature atrial contractions    PVC's (premature ventricular contractions)    Type II or unspecified type diabetes mellitus without mention of complication, uncontrolled 01/31/2013    Past Surgical History:  Procedure Laterality Date   ABDOMINAL HYSTERECTOMY     JOINT REPLACEMENT     right knee 2006   rotater cuff     right, 2005    Family History  Problem Relation Age of Onset   Heart disease Father    Arthritis Other    Heart disease Other    Hypertension Other    Diabetes Other    Alcohol abuse Other    Arthritis Other    Cancer Other        Lung Cancer   Heart disease Other    Hypertension Other    Sudden death Other    Kidney disease Other    Mental illness Other    Diabetes Other    Colon cancer Neg Hx     Social History:  reports that she has never smoked. She has never used smokeless tobacco. She reports that she does not drink alcohol and does not use drugs.  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 Scheduled:  artificial tears  1 Application Both  Eyes Q8H   Chlorhexidine Gluconate Cloth  6 each Topical Daily   etomidate  20 mg Intravenous Once   feeding supplement (PROSource TF20)  60 mL Per Tube BID   fentaNYL (SUBLIMAZE) injection  200 mcg Intravenous Once   free water  200 mL Per Tube Q8H   hydrocerin   Topical BID   hydrocortisone sod succinate (SOLU-CORTEF) inj  100 mg Intravenous Q8H   insulin aspart  0-15 Units Subcutaneous Q4H   ipratropium-albuterol  3 mL Nebulization Q6H   lidocaine  2 patch Transdermal Q24H   midazolam  5 mg Intravenous Once   mouth rinse  15 mL Mouth Rinse Q2H    pantoprazole  40 mg Per Tube Daily   sodium bicarbonate  650 mg Per Tube BID   sodium chloride flush  10-40 mL Intracatheter Q12H   Thrombi-Pad  1 each Topical Once   Continuous:  sodium chloride     ampicillin (OMNIPEN) IV 2 g (02/13/22 1419)   ceFEPime (MAXIPIME) IV Stopped (02/12/22 1842)   feeding supplement (VITAL 1.5 CAL)     fentaNYL infusion INTRAVENOUS 50 mcg/hr (02/13/22 0500)   furosemide (LASIX) 200 mg in dextrose 5 % 100 mL (2 mg/mL) infusion 8 mg/hr (02/13/22 1610)   linezolid (ZYVOX) IV 600 mg (02/13/22 1234)   norepinephrine (LEVOPHED) Adult infusion 40 mcg/min (02/13/22 1207)   propofol (DIPRIVAN) infusion 20 mcg/kg/min (02/13/22 1422)   vasopressin 0.03 Units/min (02/13/22 1213)   RUE:AVWUJ/WJXBJYNW arterial line **AND** sodium chloride, acetaminophen, albuterol, cisatracurium (NIMBEX) injection, docusate, fentaNYL, fentaNYL (SUBLIMAZE) injection, midazolam, ondansetron (ZOFRAN) IV, mouth rinse, polyethylene glycol, polyvinyl alcohol, sodium chloride flush     Latest Ref Rng & Units 02/13/2022   11:45 AM 02/13/2022    6:50 AM 02/13/2022    4:23 AM  BMP  Glucose 70 - 99 mg/dL   182   BUN 8 - 23 mg/dL   114   Creatinine 0.44 - 1.00 mg/dL   3.15   Sodium 135 - 145 mmol/L 132  133  135   Potassium 3.5 - 5.1 mmol/L 4.8  5.2  5.3   Chloride 98 - 111 mmol/L   99   CO2 22 - 32 mmol/L   19   Calcium 8.9 - 10.3 mg/dL   8.4       Latest Ref Rng & Units 02/13/2022   11:45 AM 02/13/2022    6:50 AM 02/13/2022    4:23 AM  CBC  WBC 4.0 - 10.5 K/uL   36.7   Hemoglobin 12.0 - 15.0 g/dL 9.2  9.5  7.5   Hematocrit 36.0 - 46.0 % 27.0  28.0  25.0   Platelets 150 - 400 K/uL   63    Urinalysis    Component Value Date/Time   COLORURINE AMBER (A) 01/26/2022 0808   APPEARANCEUR CLOUDY (A) 02/05/2022 0808   LABSPEC 1.012 01/21/2022 0808   PHURINE 5.0 01/14/2022 0808   GLUCOSEU 150 (A) 02/08/2022 0808   GLUCOSEU NEGATIVE 09/28/2020 1549   HGBUR SMALL (A) 02/09/2022 0808    BILIRUBINUR NEGATIVE 02/09/2022 0808   KETONESUR NEGATIVE 01/13/2022 0808   PROTEINUR 100 (A) 01/27/2022 0808   UROBILINOGEN 0.2 09/28/2020 1549   NITRITE NEGATIVE 01/14/2022 0808   LEUKOCYTESUR NEGATIVE 01/14/2022 0808    DG CHEST PORT 1 VIEW  Result Date: 02/13/2022 CLINICAL DATA:  Encounter for central line placement. EXAM: PORTABLE CHEST 1 VIEW COMPARISON:  Prior chest radiographs 02/12/2022. FINDINGS: Interval placement of a right IJ approach central venous catheter  with tip terminating at the level of the superior cavoatrial junction. ET tube present with tip terminating at the level of the midthoracic trachea. An enteric tube passes below the level of the left hemidiaphragm and courses outside of the field of view, however, the tip of the tube is visualized at the expected level of the gastric fundus. Unchanged cardiomegaly. Aortic atherosclerosis. Aeration of the left upper lobe has improved. Diffuse bilateral airspace disease has otherwise not significantly changed. No evidence of pleural effusion or pneumothorax. Spondylosis and dextrocurvature of the thoracic spine. IMPRESSION: Interval placement of a right IJ approach central venous catheter with tip terminating at the level of the superior cavoatrial junction. Additional support apparatus, as described. Improved aeration of the left upper lobe. Diffuse bilateral airspace disease has otherwise not significantly changed. Cardiomegaly. Aortic Atherosclerosis (ICD10-I70.0). Electronically Signed   By: Kellie Simmering D.O.   On: 02/13/2022 10:29   DG CHEST PORT 1 VIEW  Result Date: 02/12/2022 CLINICAL DATA:  Hypoxia. EXAM: PORTABLE CHEST 1 VIEW COMPARISON:  02/12/2022 at 8:57 a.m. FINDINGS: Bilateral airspace opacities have significantly increased from the earlier study, rapid increased consistent with pulmonary edema. Cardiac silhouette is enlarged, partly obscured by the contiguous lung opacities. Presumed bilateral effusions. No pneumothorax.  Endotracheal tube and enteric tube are stable in well positioned. IMPRESSION: 1. Significant interval worsening of lung aeration consistent with progression of pulmonary edema. Electronically Signed   By: Lajean Manes M.D.   On: 02/12/2022 14:40   DG CHEST PORT 1 VIEW  Result Date: 02/12/2022 CLINICAL DATA:  Status post cardiac arrest, difficulty breathing EXAM: PORTABLE CHEST 1 VIEW COMPARISON:  Previous studies including the examination of 02/08/2022 FINDINGS: Tip of endotracheal tube is 3.1 cm above the carina. Enteric tube is noted traversing the esophagus. Transverse diameter of heart is increased. There is slight decrease in pulmonary vascular congestion. Still, there is prominence of central pulmonary vessels suggesting CHF there is prominence of interstitial markings in parahilar regions and lower lung fields suggesting mild interstitial edema. There is no new focal pulmonary consolidation. Small bilateral pleural effusions are seen, more so on the right side. There is no pneumothorax. IMPRESSION: Cardiomegaly. There is decrease in pulmonary vascular congestion. There is residual CHF and mild interstitial pulmonary edema. Small bilateral pleural effusions, more so on the right side. Electronically Signed   By: Elmer Picker M.D.   On: 02/12/2022 11:24    Renal ultrasound: No evidence of hydronephrosis.  Diffuse increased echogenicity of both cortices and multiple punctate echogenic foci seen within the left renal cortex favoring vascular calcification.  Benign cortical cyst also seen in the lower pole of the left kidney.  Review of Systems  Unable to perform ROS: Intubated   Blood pressure 101/63, pulse 65, temperature 97.7 F (36.5 C), temperature source Axillary, resp. rate (!) 35, height 5' (1.524 m), weight 99.7 kg, SpO2 90 %. Physical Exam Vitals and nursing note reviewed.  Constitutional:      Appearance: She is obese. She is ill-appearing and toxic-appearing.  HENT:     Head:  Atraumatic.     Comments: Intubated    Right Ear: External ear normal.     Left Ear: External ear normal.  Eyes:     Conjunctiva/sclera: Conjunctivae normal.  Cardiovascular:     Rate and Rhythm: Normal rate and regular rhythm.     Pulses: Normal pulses.     Heart sounds: Normal heart sounds.  Pulmonary:     Breath sounds: Normal breath sounds.  Comments: On ventilator Abdominal:     General: Bowel sounds are normal. There is distension.     Palpations: Abdomen is soft.     Tenderness: There is no abdominal tenderness. There is no guarding.  Musculoskeletal:     Right lower leg: Edema present.     Left lower leg: Edema present.     Comments: Trace bilateral lower extremity edema with focal areas of lichenification/stasis dermatitis  Skin:    General: Skin is warm and dry.     Assessment/Plan: 1.  Acute kidney injury: Oliguric/anuric.  Likely from ischemic ATN in the setting of sepsis/shock.  Renal ultrasound without evidence of obstruction and she has an indwelling Foley catheter for accurate urine collection.  Unfortunately, based on the burden of acute clinical comorbidities and limited role of renal replacement therapy, would not not be prudent to offer this treatment to her current level of care as I fear that it may hasten mortality and/or prolong her dependency on medical support without clinical improvement of her overall picture.  I will reassess follow-up labs from this afternoon. Avoid nephrotoxic medications including NSAIDs and iodinated intravenous contrast exposure unless the latter is absolutely indicated.  Preferred narcotic agents for pain control are hydromorphone, fentanyl, and methadone. Morphine should not be used. Avoid Baclofen and avoid oral sodium phosphate and magnesium citrate based laxatives / bowel preps. Continue strict Input and Output monitoring. Will monitor the patient closely with you and intervene or adjust therapy as indicated by changes in clinical  status/labs. Spoke to her children regarding the overall prognosis and my desire (which appears to echo their sentiments) to avoid renal replacement therapy.  I will empirically begin Veltassa for potassium binding along with oral sodium bicarbonate to limit additional volume loading. 2.  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  On stress dose steroids and antimicrobial therapy with ampicillin and high-dose ceftriaxone. 3.  Acute encephalopathy: Appears to be multifactorial including septic cerebral infarcts and possibly hypoxic injury from initial arrest. 4.  Acute hypoxic respiratory failure: With significant oxygenation/ventilation difficulties noted yesterday necessitating prone ventilation with marginal benefit.  Currently supine.   Maysin Carstens K. 02/13/2022, 4:33 PM

## 2022-02-14 DIAGNOSIS — J9601 Acute respiratory failure with hypoxia: Secondary | ICD-10-CM | POA: Diagnosis not present

## 2022-02-14 DIAGNOSIS — G9340 Encephalopathy, unspecified: Secondary | ICD-10-CM

## 2022-02-14 DIAGNOSIS — R7881 Bacteremia: Secondary | ICD-10-CM | POA: Diagnosis not present

## 2022-02-14 DIAGNOSIS — J80 Acute respiratory distress syndrome: Secondary | ICD-10-CM | POA: Diagnosis not present

## 2022-02-14 DIAGNOSIS — I469 Cardiac arrest, cause unspecified: Secondary | ICD-10-CM | POA: Diagnosis not present

## 2022-02-14 LAB — CBC WITH DIFFERENTIAL/PLATELET
Abs Immature Granulocytes: 1.1 10*3/uL — ABNORMAL HIGH (ref 0.00–0.07)
Basophils Absolute: 0.1 10*3/uL (ref 0.0–0.1)
Basophils Relative: 0 %
Eosinophils Absolute: 0 10*3/uL (ref 0.0–0.5)
Eosinophils Relative: 0 %
HCT: 20.8 % — ABNORMAL LOW (ref 36.0–46.0)
Hemoglobin: 6.8 g/dL — CL (ref 12.0–15.0)
Immature Granulocytes: 3 %
Lymphocytes Relative: 3 %
Lymphs Abs: 1.1 10*3/uL (ref 0.7–4.0)
MCH: 28.6 pg (ref 26.0–34.0)
MCHC: 32.7 g/dL (ref 30.0–36.0)
MCV: 87.4 fL (ref 80.0–100.0)
Monocytes Absolute: 1.1 10*3/uL — ABNORMAL HIGH (ref 0.1–1.0)
Monocytes Relative: 3 %
Neutro Abs: 32.4 10*3/uL — ABNORMAL HIGH (ref 1.7–7.7)
Neutrophils Relative %: 91 %
Platelets: 56 10*3/uL — ABNORMAL LOW (ref 150–400)
RBC: 2.38 MIL/uL — ABNORMAL LOW (ref 3.87–5.11)
RDW: 24.8 % — ABNORMAL HIGH (ref 11.5–15.5)
WBC: 35.7 10*3/uL — ABNORMAL HIGH (ref 4.0–10.5)
nRBC: 4.2 % — ABNORMAL HIGH (ref 0.0–0.2)

## 2022-02-14 LAB — GLUCOSE, CAPILLARY
Glucose-Capillary: 192 mg/dL — ABNORMAL HIGH (ref 70–99)
Glucose-Capillary: 148 mg/dL — ABNORMAL HIGH (ref 70–99)
Glucose-Capillary: 117 mg/dL — ABNORMAL HIGH (ref 70–99)
Glucose-Capillary: 154 mg/dL — ABNORMAL HIGH (ref 70–99)
Glucose-Capillary: 159 mg/dL — ABNORMAL HIGH (ref 70–99)
Glucose-Capillary: 194 mg/dL — ABNORMAL HIGH (ref 70–99)

## 2022-02-14 LAB — POCT I-STAT 7, (LYTES, BLD GAS, ICA,H+H)
Acid-base deficit: 8 mmol/L — ABNORMAL HIGH (ref 0.0–2.0)
Acid-base deficit: 8 mmol/L — ABNORMAL HIGH (ref 0.0–2.0)
Acid-base deficit: 9 mmol/L — ABNORMAL HIGH (ref 0.0–2.0)
Bicarbonate: 19.4 mmol/L — ABNORMAL LOW (ref 20.0–28.0)
Bicarbonate: 19.6 mmol/L — ABNORMAL LOW (ref 20.0–28.0)
Bicarbonate: 19.7 mmol/L — ABNORMAL LOW (ref 20.0–28.0)
Calcium, Ion: 1.03 mmol/L — ABNORMAL LOW (ref 1.15–1.40)
Calcium, Ion: 1.03 mmol/L — ABNORMAL LOW (ref 1.15–1.40)
Calcium, Ion: 1.04 mmol/L — ABNORMAL LOW (ref 1.15–1.40)
HCT: 25 % — ABNORMAL LOW (ref 36.0–46.0)
HCT: 25 % — ABNORMAL LOW (ref 36.0–46.0)
HCT: 26 % — ABNORMAL LOW (ref 36.0–46.0)
Hemoglobin: 8.5 g/dL — ABNORMAL LOW (ref 12.0–15.0)
Hemoglobin: 8.5 g/dL — ABNORMAL LOW (ref 12.0–15.0)
Hemoglobin: 8.8 g/dL — ABNORMAL LOW (ref 12.0–15.0)
O2 Saturation: 97 %
O2 Saturation: 98 %
O2 Saturation: 99 %
Patient temperature: 98.1
Patient temperature: 98.2
Patient temperature: 98.2
Potassium: 4.9 mmol/L (ref 3.5–5.1)
Potassium: 5.1 mmol/L (ref 3.5–5.1)
Potassium: 5.1 mmol/L (ref 3.5–5.1)
Sodium: 131 mmol/L — ABNORMAL LOW (ref 135–145)
Sodium: 131 mmol/L — ABNORMAL LOW (ref 135–145)
Sodium: 132 mmol/L — ABNORMAL LOW (ref 135–145)
TCO2: 21 mmol/L — ABNORMAL LOW (ref 22–32)
TCO2: 21 mmol/L — ABNORMAL LOW (ref 22–32)
TCO2: 21 mmol/L — ABNORMAL LOW (ref 22–32)
pCO2 arterial: 46.1 mmHg (ref 32–48)
pCO2 arterial: 49.8 mmHg — ABNORMAL HIGH (ref 32–48)
pCO2 arterial: 59.4 mmHg — ABNORMAL HIGH (ref 32–48)
pH, Arterial: 7.125 — CL (ref 7.35–7.45)
pH, Arterial: 7.205 — ABNORMAL LOW (ref 7.35–7.45)
pH, Arterial: 7.23 — ABNORMAL LOW (ref 7.35–7.45)
pO2, Arterial: 101 mmHg (ref 83–108)
pO2, Arterial: 122 mmHg — ABNORMAL HIGH (ref 83–108)
pO2, Arterial: 172 mmHg — ABNORMAL HIGH (ref 83–108)

## 2022-02-14 LAB — HEMOGLOBIN AND HEMATOCRIT, BLOOD
HCT: 21.6 % — ABNORMAL LOW (ref 36.0–46.0)
Hemoglobin: 7 g/dL — ABNORMAL LOW (ref 12.0–15.0)

## 2022-02-14 LAB — BASIC METABOLIC PANEL
Anion gap: 23 — ABNORMAL HIGH (ref 5–15)
BUN: 118 mg/dL — ABNORMAL HIGH (ref 8–23)
CO2: 18 mmol/L — ABNORMAL LOW (ref 22–32)
Calcium: 8.4 mg/dL — ABNORMAL LOW (ref 8.9–10.3)
Chloride: 93 mmol/L — ABNORMAL LOW (ref 98–111)
Creatinine, Ser: 3.33 mg/dL — ABNORMAL HIGH (ref 0.44–1.00)
GFR, Estimated: 14 mL/min — ABNORMAL LOW (ref >60)
Glucose, Bld: 152 mg/dL — ABNORMAL HIGH (ref 70–99)
Potassium: 5.2 mmol/L — ABNORMAL HIGH (ref 3.5–5.1)
Sodium: 134 mmol/L — ABNORMAL LOW (ref 135–145)

## 2022-02-14 LAB — CULTURE, RESPIRATORY W GRAM STAIN: Culture: NO GROWTH

## 2022-02-14 LAB — MAGNESIUM: Magnesium: 2.5 mg/dL — ABNORMAL HIGH (ref 1.7–2.4)

## 2022-02-14 LAB — PHOSPHORUS: Phosphorus: 11.7 mg/dL — ABNORMAL HIGH (ref 2.5–4.6)

## 2022-02-14 MED ORDER — PATIROMER SORBITEX CALCIUM 8.4 G PO PACK
8.4000 g | PACK | Freq: Three times a day (TID) | ORAL | Status: DC
  Administered 2022-02-14 – 2022-02-19 (×14): 8.4 g via ORAL
  Filled 2022-02-14 (×17): qty 1

## 2022-02-14 MED ORDER — LINEZOLID 600 MG/300ML IV SOLN
600.0000 mg | Freq: Two times a day (BID) | INTRAVENOUS | Status: AC
  Administered 2022-02-14: 600 mg via INTRAVENOUS
  Filled 2022-02-14: qty 300

## 2022-02-14 NOTE — IPAL (Signed)
  Interdisciplinary Goals of Care Family Meeting   Date carried out:: 02/14/2022  Location of the meeting: Bedside  Member's involved: Bedside Registered Nurse, Family Member or next of kin, and Other: Physician assistant  Durable Power of Attorney or acting medical decision maker: Pt's son Alyssa Ingram and daughter Alyssa Ingram   Discussion: We discussed goals of care for [employer] [name]  Her family feels strongly that God will heal her once all medical interventions have been exhausted and want every intervention and full code  Code status: Full Code  Disposition: Continue current acute care   Time spent for the meeting: 25 minutes  Alyssa Ingram 02/14/2022, 1:36 PM

## 2022-02-14 NOTE — Progress Notes (Signed)
Patient ID: Alyssa Ingram, female   DOB: 07/03/1945, 76 y.o.   MRN: [id number] [facility] Progress Note   Assessment/ Plan:   1.  Acute kidney injury: Oliguric/anuric in spite of furosemide drip, based on the input/output, would recommend discontinuation of furosemide drip at this time.  Likely from ischemic ATN in the setting of sepsis/shock.  Anuric overnight and now on scheduled Veltassa for hyperkalemia management as well as oral/intravenous sodium bicarbonate to help with metabolic compensation for respiratory acidosis.  BUN/creatinine essentially unchanged from overnight.  She does not have any acute indications for renal replacement therapy and yet again, I have impressed upon her son that CRRT would be ill advised as it will not make any improvement to her overall prognosis and indeed may hasten mortality. 2.  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  On stress dose steroids and antimicrobial therapy with ampicillin and high-dose ceftriaxone. 3.  Acute encephalopathy: Appears to be multifactorial including septic cerebral infarcts and possibly hypoxic injury from initial arrest. 4.  Acute hypoxic respiratory failure: Arterial blood gas from this morning shows mixed respiratory and metabolic acidosis.  Ventilator adjustments per CCM.  Subjective:   No acute events overnight   Objective:   BP (!) 111/51   Pulse 70   Temp 98.1 F (36.7 C) (Oral)   Resp (!) 37   Ht 5' (1.524 m)   Wt 100.6 kg   SpO2 97%   BMI 43.31 kg/m   Intake/Output Summary (Last 24 hours) at 02/14/2022 5852 Last data filed at 02/14/2022 0700 Gross per 24 hour  Intake 3398.17 ml  Output 1015 ml  Net 2383.17 ml   Weight change: 0.9 kg  Physical Exam: Gen: Appears comfortable on ventilator CVS: Pulse regular rhythm, normal rate, S1 and S2 normal Resp: Coarse breath sounds bilaterally, no  rales/wheeze Abd: Soft, obese, nontender Ext: 2+ upper extremity edema, lower extremity edema appreciated  Imaging: DG CHEST PORT 1 VIEW  Result Date: 02/13/2022 CLINICAL DATA:  Encounter for central line placement. EXAM: PORTABLE CHEST 1 VIEW COMPARISON:  Prior chest radiographs 02/12/2022. FINDINGS: Interval placement of a right IJ approach central venous catheter with tip terminating at the level of the superior cavoatrial junction. ET tube present with tip terminating at the level of the midthoracic trachea. An enteric tube passes below the level of the left hemidiaphragm and courses outside of the field of view, however, the tip of the tube is visualized at the expected level of the gastric fundus. Unchanged cardiomegaly. Aortic atherosclerosis. Aeration of the left upper lobe has improved. Diffuse bilateral airspace disease has otherwise not significantly changed. No evidence of pleural effusion or pneumothorax. Spondylosis and dextrocurvature of the thoracic spine. IMPRESSION: Interval placement of a right IJ approach central venous catheter with tip terminating at the level of the superior cavoatrial junction. Additional support apparatus, as described. Improved aeration of the left upper lobe. Diffuse bilateral airspace disease has otherwise not significantly changed. Cardiomegaly. Aortic Atherosclerosis (ICD10-I70.0). Electronically Signed   By: Kellie Simmering D.O.   On: 02/13/2022 10:29   DG CHEST PORT 1 VIEW  Result Date: 02/12/2022 CLINICAL DATA:  Hypoxia. EXAM: PORTABLE CHEST 1 VIEW COMPARISON:  02/12/2022 at 8:57 a.m. FINDINGS: Bilateral airspace opacities have significantly increased from the earlier study, rapid increased consistent with pulmonary edema. Cardiac silhouette is enlarged, partly obscured by the contiguous lung opacities. Presumed bilateral effusions. No pneumothorax. Endotracheal tube and enteric tube are  stable in well positioned. IMPRESSION: 1. Significant interval worsening  of lung aeration consistent with progression of pulmonary edema. Electronically Signed   By: Lajean Manes M.D.   On: 02/12/2022 14:40   DG CHEST PORT 1 VIEW  Result Date: 02/12/2022 CLINICAL DATA:  Status post cardiac arrest, difficulty breathing EXAM: PORTABLE CHEST 1 VIEW COMPARISON:  Previous studies including the examination of 02/08/2022 FINDINGS: Tip of endotracheal tube is 3.1 cm above the carina. Enteric tube is noted traversing the esophagus. Transverse diameter of heart is increased. There is slight decrease in pulmonary vascular congestion. Still, there is prominence of central pulmonary vessels suggesting CHF there is prominence of interstitial markings in parahilar regions and lower lung fields suggesting mild interstitial edema. There is no new focal pulmonary consolidation. Small bilateral pleural effusions are seen, more so on the right side. There is no pneumothorax. IMPRESSION: Cardiomegaly. There is decrease in pulmonary vascular congestion. There is residual CHF and mild interstitial pulmonary edema. Small bilateral pleural effusions, more so on the right side. Electronically Signed   By: Elmer Picker M.D.   On: 02/12/2022 11:24    Labs: BMET Recent Labs  Lab 02/08/22 0359 02/09/22 0444 02/10/22 0509 02/11/22 0501 02/11/22 1512 02/12/22 0842 02/12/22 1539 02/13/22 0423 02/13/22 0650 02/13/22 1145 02/13/22 1629 02/14/22 0056 02/14/22 0200 02/14/22 0338 02/14/22 0458  NA 150* 150* 143 134* 135 136   < > 135 133* 132* 134* 132* 131* 134* 131*  K 4.3 3.9 4.0 4.4 4.9 4.7   < > 5.3* 5.2* 4.8 5.2* 4.9 5.1 5.2* 5.1  CL 114* 113* 108 96* 102 99  --  99  --   --  97*  --   --  93*  --   CO2 '24 23 23 '$ 19* 18* 18*  --  19*  --   --  20*  --   --  18*  --   GLUCOSE 121* 123* 100* 150* 144* 151*  --  182*  --   --  212*  --   --  152*  --   BUN 80* 82* 86* 90* 100* 107*  --  114*  --   --  118*  --   --  118*  --   CREATININE 1.45* 1.42* 1.57* 1.82* 2.27* 2.61*  --  3.15*   --   --  3.34*  --   --  3.33*  --   CALCIUM 9.3 9.2 8.8* 8.5* 7.7* 8.7*  --  8.4*  --   --  8.2*  --   --  8.4*  --   PHOS 5.4* 5.1* 6.3* 7.5*  --  9.2*  --  >30.0*  --   --   --   --   --  11.7*  --    < > = values in this interval not displayed.   CBC Recent Labs  Lab 02/11/22 0700 02/12/22 0842 02/12/22 1539 02/13/22 0423 02/13/22 0650 02/14/22 0200 02/14/22 0338 02/14/22 0441 02/14/22 0458  WBC 19.8* 24.4*  --  36.7*  --   --  35.7*  --   --   NEUTROABS 15.9* 19.0*  --  31.5*  --   --  32.4*  --   --   HGB 7.3* 7.7*   < > 7.5*   < > 8.8* 6.8* 7.0* 8.5*  HCT 22.3* 23.9*   < > 25.0*   < > 26.0* 20.8* 21.6* 25.0*  MCV 89.9 88.2  --  93.3  --   --  87.4  --   --   PLT 59* 48*  --  63*  --   --  56*  --   --    < > = values in this interval not displayed.    Medications:     artificial tears  1 Application Both Eyes G9F   Chlorhexidine Gluconate Cloth  6 each Topical Daily   etomidate  20 mg Intravenous Once   feeding supplement (PROSource TF20)  60 mL Per Tube BID   fentaNYL (SUBLIMAZE) injection  200 mcg Intravenous Once   free water  200 mL Per Tube Q8H   hydrocerin   Topical BID   hydrocortisone sod succinate (SOLU-CORTEF) inj  100 mg Intravenous Q8H   insulin aspart  0-15 Units Subcutaneous Q4H   ipratropium-albuterol  3 mL Nebulization Q6H   lidocaine  2 patch Transdermal Q24H   midazolam  5 mg Intravenous Once   mouth rinse  15 mL Mouth Rinse Q2H   pantoprazole  40 mg Per Tube Daily   patiromer  8.4 g Oral Q8H   sodium bicarbonate  1,300 mg Per Tube TID   sodium chloride flush  10-40 mL Intracatheter Q12H   Thrombi-Pad  1 each Topical Once    Elmarie Shiley, MD 02/14/2022, 8:33 AM

## 2022-02-14 NOTE — Progress Notes (Addendum)
NAME:  Alyssa Ingram, MRN:  [id number], DOB:  1945/06/10, LOS: 31 ADMISSION DATE:  02/01/2022, CONSULTATION DATE:  01/16/2022 REFERRING MD:  Leonette Monarch - EDP CHIEF COMPLAINT:  PEA arrest  History of Present Illness:  76 year old woman who presented to [facility] ED 9/18 post-PEA arrest. PMHx significant for HTN, HLD, HFpEF, HOCM, asthma, T2DM, GERD, obesity.   History is provided by son who lives with the patient.   She has been feeling poorly, including weakness and malaise for several days.  Chronic leg and foot pain was worse than usual.  This was followed by several days of abdominal discomfort, though no diarrhea or nausea or vomiting.  She was incontinent of stool once which is unusual for her.  9/17 she also developed neck pain.  She usually sleeps laying flat and was able to sleep Saturday night. Since yesterday, they have seen her leaning over the sink and counter bracing herself.    She was feeling worse, becoming weaker and they had decided to take her to the ED, when she started to get worse and more altered.  911 called, and she had completely lost consciousness at that time. When EMS arrived, breathing was agonal and she was pulseless. CPR x 22 min with ROSC, [place] airway in the field.    Intubated on arrival to ED. Neurologically, she was doing enough to fight the tube that she did receive doses of sedation in ED. Required pressor initiation (Levophed) and broad-spectrum antibiotic coverage with Vanc/Zosyn.  On 9/19, patient was noted to be waking up and following commands. She was extubated 9/20; shortly after extubation she required reintubation for upper airway edema. On 9/21, noted to be less responsive. Underwent MRI Brain 9/22 which showed multiple embolic/septic strokes, raising concern for endocarditis. TEE 9/25 demonstrating highly mobile valvular vegetation on MV c/w endocarditis.  Pertinent Medical History:   Past Medical History:  Diagnosis Date   Abnormal MRI, spine 11/2011    ?discitis L5-S1, s/p CT bx 12/12/11   Allergic rhinitis, cause unspecified 01/31/2013   Arthritis    Asthma    Cervical cancer ([place]) 1980   Chronic diastolic heart failure (HCC)    Diabetic retinopathy ([place])    GERD (gastroesophageal reflux disease)    H/O cardiovascular stress test    Nuclear study in 2011 normal perfusion   HOCM (hypertrophic obstructive cardiomyopathy) ([place])    Hyperlipidemia    Hypertension    Kidney stones    Mitral regurgitation    Mitral stenosis    Obesity    Premature atrial contractions    PVC's (premature ventricular contractions)    Type II or unspecified type diabetes mellitus without mention of complication, uncontrolled 01/31/2013   Significant Hospital Events: Including procedures, antibiotic start and stop dates in addition to other pertinent events   9/18 Admitted to ICU 9/19 waking up, following commands.  Weaning vasopressors. 9/20 Diuresed, passed SBT and extubated, but reintubated for upper airway edema. 9/21 less responsive on no sedation. MRI, EEG ordered. 9/22  MRI which showed multiple embolic/septic strokes. 9/25 Marginally responsive. TEE completed with +highly mobile MV vegetation c/w endocarditis. 9/26 Grossly unchanged neurologic exam. [place] conversation started with family 9/27 ongoing family discussions 9/28 failed SBT 2/2 significant resp distress, amio stopped given bradycardia 10/2 proned and paralyzed, IV's infiltrated, on three pressors, worsening renal failure 10/3 supinated yesterday, oxygenation improved, pressor requirement decreased however developing oliguric renal failure  Interim History / Subjective:  No overnight events, remains on Vaso and Levophed 30mg No  significant UOP, not a dialysis candidate per nephrology  Objective:  Blood pressure (!) 111/51, pulse 70, temperature 98.1 F (36.7 C), temperature source Oral, resp. rate (!) 37, height 5' (1.524 m), weight 100.6 kg, SpO2 97 %.    Vent Mode: PRVC FiO2 (%):   [60 %-100 %] 60 % Set Rate:  [35 bmp] 35 bmp Vt Set:  [310 mL-360 mL] 360 mL PEEP:  [16 cmH20-18 cmH20] 16 cmH20 Plateau Pressure:  [31 cmH20-34 cmH20] 34 cmH20   Intake/Output Summary (Last 24 hours) at 02/14/2022 1104 Last data filed at 02/14/2022 0700 Gross per 24 hour  Intake 2751.81 ml  Output 1015 ml  Net 1736.81 ml    Filed Weights   02/12/22 0500 02/13/22 0500 02/14/22 0512  Weight: 96 kg 99.7 kg 100.6 kg    General:  critically ill F intubated and sedated HEENT: MM pink/moist, pupils equal, sluggishly responsive to light Neuro: examined on propofol 65mg and fentanyl, not overbreathing vent, minmal gag and corneals, does not withdraw to pain CV: s1s2 rrr, no m/r/g PULM:  mechanically ventilated, synchronous, no rhonchi or wheezing, equal chest rise GI: soft, bsx4 active  Extremities: warm/dry, edematous throughout Skin: no rashes or lesions   Creatinine 3 Hgb 6.8, but improved on repeat lab Platelets 56 WBC 35 rising Minimal UOP  UKorearenal> no hydronephrosis, changes consistent with medical renal disease, peripheral stones, benign cortical cyst, R pleural effusion  MRI brain>There are innumerable supra and infratentorial acute infarcts. 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  -stop Linezolid 10/4, d/w pharmacy -PIVs; on line holiday currently  Paroxysmal Afib - AC on hold given inflammatory milieu in CNS and high risk for hemorrhagic conversion; can resume 10/6 - amiodarone; con't holding Bblocker, remains  bradycardic   HTN -holding home meds   Prolonged OHCA-  had initially recovered, was talking following commands prior to showering of emboli  -con't supportive care -sedation wean yesterday and was not responsive, too unstable for SBT/SAT today  AKI with oliguria, UOP started to drop off 9/29.  Hypervolemia   Trial of Lasix gtt with minimal UOP 10/2 and 10/3 -appreciate nephrology consult, stopping lasix gtt -not a dialysis candidate, CRRT would further drop BP and not improve outcome -on oral bicarb -renally dose meds, avoid nephrotoxic meds - foley catheter for strict I/Os  Acute encephalopathy-  due to septic cerebral infarcts above. Was awake and extubated after cardiac arrest. -continue sedation for proning for ARDS -family are hopeful for full recovery and want all supportive care measures   Acute respiratory failure with hypoxia Concern for developing VAP History of asthma, no current wheezing -Linezolid started 10/1 for HCAP -respiratory cultures no growth, likely can de-escalate in AM -LTVV -VAP prevention protocol -PAD protocol for sedation -initial plan for trach this week, then became too unstable and hypoxic, hypoxia somewhat improved, though if renal failure does not improve, trach would not be indicated, not a dialysis candidate -con't duonebs Q6h + albuterol PRN  Thrombocytopenia, likely 2/2 antibiotics or sepsis. 4T score=4, intermediate probability. -HIT ab positive, discussed with pharmacy, SRA pending -hold chemical DVT prophylaxis due to bleeding  risk from embolic strokes  Anemia -transfuse for Hb <7 or hemodynamically significant bleeding    DM2 with Hyperglycemia- stable -SSI PRN -goal BG 140-180   Stage 1 DTI sacrum -wound care      Best Practice: (right click and "Reselect all SmartList Selections" daily)   Diet/type: tube feeds DVT prophylaxis: SCD GI prophylaxis: PPI Lines: line holiday Foley:  Foley Code Status:  full  code Last date of multidisciplinary goals of care discussion [10/2, family updated by Dr. Lamonte Sakai today]   CRITICAL CARE Performed by: Otilio Carpen Rebecca Motta   Total critical care time: 35  minutes  Critical care time was exclusive of separately billable procedures and treating other patients.  Critical care was necessary to treat or prevent imminent or life-threatening deterioration.  Critical care was time spent personally by me on the following activities: development of treatment plan with patient and/or surrogate as well as nursing, discussions with consultants, evaluation of patient's response to treatment, examination of patient, obtaining history from patient or surrogate, ordering and performing treatments and interventions, ordering and review of laboratory studies, ordering and review of radiographic studies, pulse oximetry and re-evaluation of patient's condition.   Otilio Carpen Jaiveon Suppes, PA-C  Pulmonary & Critical care See Amion for pager If no response to pager , please call [phone] until 7pm After 7:00 pm call Elink  [phone]?[place]mao

## 2022-02-14 NOTE — Plan of Care (Signed)
  Problem: Education: Goal: Knowledge of disease or condition will improve Outcome: Not Progressing Goal: Knowledge of secondary prevention will improve (SELECT ALL) Outcome: Not Progressing Goal: Knowledge of patient specific risk factors will improve (INDIVIDUALIZE FOR PATIENT) Outcome: Not Progressing   Problem: Safety: Goal: Non-violent Restraint(s) Outcome: Completed/Met

## 2022-02-14 NOTE — Progress Notes (Signed)
eLink Physician-Brief Progress Note Patient Name: Alyssa Ingram DOB: September 11, 1945 MRN: [id number]   Date of Service  02/14/2022  HPI/Events of Note  Patient with paroxysmal atrial fibrillation with a controlled ventricular response rate (heart rate 70- 80's without rate control,  per bedside RN she is occasionally becoming bradycardic into the 40's.  eICU Interventions  Will not treat atrial fib with negative chronotropic agents unless heart rate is consistently > 110.        Kerry Kass Taiylor Virden 02/14/2022, 9:50 PM

## 2022-02-14 NOTE — Progress Notes (Addendum)
eLink Physician-Brief Progress Note Patient Name: Alyssa Ingram DOB: [birth year] MRN: [id number]   Date of Service  02/14/2022  HPI/Events of Note  ABG 7.125/59.4/172 on PEEP 18, TV 310, Rate 35, 70%. Peak pressure 30s.  eICU Interventions  PEEP decreased to 16.  Continue titrating FiO2.  Increase tidal volume to 330 and monitor peak and plateau pressures.  If within acceptable range, would keep on higher tidal volume and repeat ABG.     Intervention Category Intermediate Interventions: Diagnostic test evaluation  [name] 02/14/2022, 1:21 AM  5:50 AM ABG 7.205/49.8/122 --> 7.230/46.1/101.  Repeat hgb at 7 <-- 8.87 with no signs of active bleeding as per RN.   K 5.2, crea 3.33.    Plan> Continue on current vent settings.  Hold off on blood transfusion.  Continue with veltassa as ordered.

## 2022-02-15 DIAGNOSIS — J80 Acute respiratory distress syndrome: Secondary | ICD-10-CM | POA: Diagnosis not present

## 2022-02-15 DIAGNOSIS — J9601 Acute respiratory failure with hypoxia: Secondary | ICD-10-CM | POA: Diagnosis not present

## 2022-02-15 DIAGNOSIS — R7881 Bacteremia: Secondary | ICD-10-CM | POA: Diagnosis not present

## 2022-02-15 DIAGNOSIS — R579 Shock, unspecified: Secondary | ICD-10-CM | POA: Diagnosis not present

## 2022-02-15 LAB — CBC WITH DIFFERENTIAL/PLATELET
Abs Immature Granulocytes: 0.41 10*3/uL — ABNORMAL HIGH (ref 0.00–0.07)
Abs Immature Granulocytes: 0.51 10*3/uL — ABNORMAL HIGH (ref 0.00–0.07)
Basophils Absolute: 0.1 10*3/uL (ref 0.0–0.1)
Basophils Absolute: 0.1 10*3/uL (ref 0.0–0.1)
Basophils Relative: 0 %
Basophils Relative: 0 %
Eosinophils Absolute: 0 10*3/uL (ref 0.0–0.5)
Eosinophils Absolute: 0 10*3/uL (ref 0.0–0.5)
Eosinophils Relative: 0 %
Eosinophils Relative: 0 %
HCT: 19.5 % — ABNORMAL LOW (ref 36.0–46.0)
HCT: 19 % — ABNORMAL LOW (ref 36.0–46.0)
Hemoglobin: 6.1 g/dL — CL (ref 12.0–15.0)
Hemoglobin: 6.1 g/dL — CL (ref 12.0–15.0)
Immature Granulocytes: 2 %
Immature Granulocytes: 2 %
Lymphocytes Relative: 3 %
Lymphocytes Relative: 2 %
Lymphs Abs: 0.8 10*3/uL (ref 0.7–4.0)
Lymphs Abs: 0.6 10*3/uL — ABNORMAL LOW (ref 0.7–4.0)
MCH: 27.7 pg (ref 26.0–34.0)
MCH: 28.2 pg (ref 26.0–34.0)
MCHC: 31.3 g/dL (ref 30.0–36.0)
MCHC: 32.1 g/dL (ref 30.0–36.0)
MCV: 88.6 fL (ref 80.0–100.0)
MCV: 88 fL (ref 80.0–100.0)
Monocytes Absolute: 1.1 10*3/uL — ABNORMAL HIGH (ref 0.1–1.0)
Monocytes Absolute: 1.1 10*3/uL — ABNORMAL HIGH (ref 0.1–1.0)
Monocytes Relative: 4 %
Monocytes Relative: 4 %
Neutro Abs: 25.9 10*3/uL — ABNORMAL HIGH (ref 1.7–7.7)
Neutro Abs: 26.1 10*3/uL — ABNORMAL HIGH (ref 1.7–7.7)
Neutrophils Relative %: 91 %
Neutrophils Relative %: 92 %
Platelets: 78 10*3/uL — ABNORMAL LOW (ref 150–400)
Platelets: 77 10*3/uL — ABNORMAL LOW (ref 150–400)
RBC: 2.2 MIL/uL — ABNORMAL LOW (ref 3.87–5.11)
RBC: 2.16 MIL/uL — ABNORMAL LOW (ref 3.87–5.11)
RDW: 25.7 % — ABNORMAL HIGH (ref 11.5–15.5)
RDW: 25.4 % — ABNORMAL HIGH (ref 11.5–15.5)
WBC: 28.2 10*3/uL — ABNORMAL HIGH (ref 4.0–10.5)
WBC: 28.4 10*3/uL — ABNORMAL HIGH (ref 4.0–10.5)
nRBC: 4.2 % — ABNORMAL HIGH (ref 0.0–0.2)
nRBC: 4.6 % — ABNORMAL HIGH (ref 0.0–0.2)

## 2022-02-15 LAB — GLUCOSE, CAPILLARY
Glucose-Capillary: 159 mg/dL — ABNORMAL HIGH (ref 70–99)
Glucose-Capillary: 202 mg/dL — ABNORMAL HIGH (ref 70–99)
Glucose-Capillary: 218 mg/dL — ABNORMAL HIGH (ref 70–99)
Glucose-Capillary: 219 mg/dL — ABNORMAL HIGH (ref 70–99)
Glucose-Capillary: 225 mg/dL — ABNORMAL HIGH (ref 70–99)
Glucose-Capillary: 226 mg/dL — ABNORMAL HIGH (ref 70–99)
Glucose-Capillary: 240 mg/dL — ABNORMAL HIGH (ref 70–99)

## 2022-02-15 LAB — POCT I-STAT 7, (LYTES, BLD GAS, ICA,H+H)
Acid-base deficit: 9 mmol/L — ABNORMAL HIGH (ref 0.0–2.0)
Bicarbonate: 17.5 mmol/L — ABNORMAL LOW (ref 20.0–28.0)
Calcium, Ion: 1.01 mmol/L — ABNORMAL LOW (ref 1.15–1.40)
HCT: 22 % — ABNORMAL LOW (ref 36.0–46.0)
Hemoglobin: 7.5 g/dL — ABNORMAL LOW (ref 12.0–15.0)
O2 Saturation: 97 %
Patient temperature: 98
Potassium: 4.6 mmol/L (ref 3.5–5.1)
Sodium: 129 mmol/L — ABNORMAL LOW (ref 135–145)
TCO2: 19 mmol/L — ABNORMAL LOW (ref 22–32)
pCO2 arterial: 39.4 mmHg (ref 32–48)
pH, Arterial: 7.255 — ABNORMAL LOW (ref 7.35–7.45)
pO2, Arterial: 102 mmHg (ref 83–108)

## 2022-02-15 LAB — MAGNESIUM
Magnesium: 2.4 mg/dL (ref 1.7–2.4)
Magnesium: 2.5 mg/dL — ABNORMAL HIGH (ref 1.7–2.4)

## 2022-02-15 LAB — HEMOGLOBIN AND HEMATOCRIT, BLOOD
HCT: 21.1 % — ABNORMAL LOW (ref 36.0–46.0)
Hemoglobin: 7.1 g/dL — ABNORMAL LOW (ref 12.0–15.0)

## 2022-02-15 LAB — TRIGLYCERIDES: Triglycerides: 239 mg/dL — ABNORMAL HIGH (ref <150)

## 2022-02-15 LAB — PREPARE RBC (CROSSMATCH)

## 2022-02-15 MED ORDER — CALCIUM GLUCONATE-NACL 1-0.675 GM/50ML-% IV SOLN
1.0000 g | Freq: Once | INTRAVENOUS | Status: AC
  Administered 2022-02-15: 1000 mg via INTRAVENOUS
  Filled 2022-02-15: qty 50

## 2022-02-15 MED ORDER — FUROSEMIDE 10 MG/ML IJ SOLN
80.0000 mg | Freq: Four times a day (QID) | INTRAMUSCULAR | Status: AC
  Administered 2022-02-15 (×2): 80 mg via INTRAVENOUS
  Filled 2022-02-15 (×2): qty 8

## 2022-02-15 MED ORDER — INSULIN ASPART 100 UNIT/ML IJ SOLN
4.0000 [IU] | INTRAMUSCULAR | Status: DC
  Administered 2022-02-15 – 2022-02-16 (×6): 4 [IU] via SUBCUTANEOUS

## 2022-02-15 MED ORDER — SODIUM CHLORIDE 0.9% IV SOLUTION: Freq: Once | INTRAVENOUS | Status: DC

## 2022-02-15 NOTE — Progress Notes (Signed)
eLink Physician-Brief Progress Note Patient Name: LASHAYE FISK DOB: Apr 08, 1946 MRN: [id number]   Date of Service  02/15/2022  HPI/Events of Note  Ca++  1.01, Hemoglobin 6.1 gm / dl (pending confirmation from CBC), results are from ABG.  eICU Interventions  Calcium gluconate 1 gm iv x 1.        Kerry Kass Jamarious Febo 02/15/2022, 5:05 AM

## 2022-02-15 NOTE — Progress Notes (Signed)
Patient ID: Alyssa Ingram, female   DOB: [birth year], 76 y.o.   MRN: [id number] [facility] Progress Note   Assessment/ Plan:   1.  Acute kidney injury: Oliguric/anuric in spite of furosemide drip, based on the input/output, would recommend discontinuation of furosemide drip at this time.  Likely from ischemic ATN in the setting of sepsis/shock.  Remains anuric and with worsening renal function/azotemia noted on labs. I have discussed my findings and the current situation/decision making with her son [name] (and daughter Ashby Dawes on the phone) that CRRT would be ill advised as it will not make any improvement to her overall prognosis and indeed may hasten mortality.  They inquire about potential elements that we can administer to her to promote regenerative medicine; I informed them that unfortunately these therapies are not validated. 2.  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  On stress dose steroids and antimicrobial therapy with ampicillin and high-dose ceftriaxone. 3.  Acute encephalopathy: Appears to be multifactorial including septic cerebral infarcts and possibly hypoxic injury from initial arrest. 4.  Acute hypoxic respiratory failure: Arterial blood gas from this morning shows mixed respiratory and metabolic acidosis.  Ventilator adjustments per CCM. 5.  Anemia: With acute hemoglobin drop overnight and getting PRBC transfusion.  Subjective:   With acute hemoglobin drop this morning prompting need for PRBC transfusion.   Objective:   BP 118/61   Pulse 89   Temp 97.8 F (36.6 C)   Resp (!) 35   Ht 5' (1.524 m)   Wt 105.1 kg   SpO2 99%   BMI 45.25 kg/m   Intake/Output Summary (Last 24 hours) at 02/15/2022 0911 Last data filed at 02/15/2022 0109 Gross per 24 hour  Intake 3163.57 ml  Output 16 ml  Net 3147.57 ml   Weight change: 4.5 kg  Physical Exam: Gen: Intubated,  sedated, appears comfortable CVS: Pulse regular rhythm, normal rate, S1 and S2 normal Resp: Coarse breath sounds bilaterally, no rales/wheeze Abd: Soft, obese, nontender Ext: 2+ upper extremity edema, lower extremity edema appreciated  Imaging: DG CHEST PORT 1 VIEW  Result Date: 02/13/2022 CLINICAL DATA:  Encounter for central line placement. EXAM: PORTABLE CHEST 1 VIEW COMPARISON:  Prior chest radiographs 02/12/2022. FINDINGS: Interval placement of a right IJ approach central venous catheter with tip terminating at the level of the superior cavoatrial junction. ET tube present with tip terminating at the level of the midthoracic trachea. An enteric tube passes below the level of the left hemidiaphragm and courses outside of the field of view, however, the tip of the tube is visualized at the expected level of the gastric fundus. Unchanged cardiomegaly. Aortic atherosclerosis. Aeration of the left upper lobe has improved. Diffuse bilateral airspace disease has otherwise not significantly changed. No evidence of pleural effusion or pneumothorax. Spondylosis and dextrocurvature of the thoracic spine. IMPRESSION: Interval placement of a right IJ approach central venous catheter with tip terminating at the level of the superior cavoatrial junction. Additional support apparatus, as described. Improved aeration of the left upper lobe. Diffuse bilateral airspace disease has otherwise not significantly changed. Cardiomegaly. Aortic Atherosclerosis (ICD10-I70.0). Electronically Signed   By: Kellie Simmering D.O.   On: 02/13/2022 10:29    Labs: [employer] Recent [employer] 02/09/22 0444 02/10/22 3235 02/11/22 0501 02/11/22 1512 02/12/22 5732 02/12/22 1539 02/13/22 0423 02/13/22 2025 02/13/22 1629 02/14/22 0056 02/14/22 0200 02/14/22 4270 02/14/22 0458 02/15/22 0341 02/15/22 0343  NA 150* 143  134* 135 136   < > 135   < > 134* 132* 131* 134* 131* 131* 129*  K 3.9 4.0 4.4 4.9 4.7   < > 5.3*   < > 5.2* 4.9 5.1  5.2* 5.1 4.7 4.6  CL 113* 108 96* 102 99  --  99  --  97*  --   --  93*  --  93*  --   CO2 23 23 19* 18* 18*  --  19*  --  20*  --   --  18*  --  16*  --   GLUCOSE 123* 100* 150* 144* 151*  --  182*  --  212*  --   --  152*  --  262*  --   BUN 82* 86* 90* 100* 107*  --  114*  --  118*  --   --  118*  --  135*  --   CREATININE 1.42* 1.57* 1.82* 2.27* 2.61*  --  3.15*  --  3.34*  --   --  3.33*  --  3.86*  --   CALCIUM 9.2 8.8* 8.5* 7.7* 8.7*  --  8.4*  --  8.2*  --   --  8.4*  --  8.1*  --   PHOS 5.1* 6.3* 7.5*  --  9.2*  --  >30.0*  --   --   --   --  11.7*  --   --   --    < > = values in this interval not displayed.   CBC Recent Labs  Lab 02/13/22 0423 02/13/22 0650 02/14/22 0338 02/14/22 0441 02/14/22 0458 02/15/22 0341 02/15/22 0343 02/15/22 0458  WBC 36.7*  --  35.7*  --   --  28.2*  --  28.4*  NEUTROABS 31.5*  --  32.4*  --   --  25.9*  --  26.1*  HGB 7.5*   < > 6.8*   < > 8.5* 6.1* 7.5* 6.1*  HCT 25.0*   < > 20.8*   < > 25.0* 19.5* 22.0* 19.0*  MCV 93.3  --  87.4  --   --  88.6  --  88.0  PLT 63*  --  56*  --   --  78*  --  77*   < > = values in this interval not displayed.    Medications:     sodium chloride   Intravenous Once   artificial tears  1 Application Both Eyes Q0H   Chlorhexidine Gluconate Cloth  6 each Topical Daily   etomidate  20 mg Intravenous Once   feeding supplement (PROSource TF20)  60 mL Per Tube BID   fentaNYL (SUBLIMAZE) injection  200 mcg Intravenous Once   hydrocerin   Topical BID   hydrocortisone sod succinate (SOLU-CORTEF) inj  100 mg Intravenous Q8H   insulin aspart  0-15 Units Subcutaneous Q4H   insulin aspart  4 Units Subcutaneous Q4H   ipratropium-albuterol  3 mL Nebulization Q6H   lidocaine  2 patch Transdermal Q24H   midazolam  5 mg Intravenous Once   mouth rinse  15 mL Mouth Rinse Q2H   pantoprazole  40 mg Per Tube Daily   patiromer  8.4 g Oral Q8H   sodium bicarbonate  1,300 mg Per Tube TID   sodium chloride flush  10-40 mL  Intracatheter Q12H   Thrombi-Pad  1 each Topical Once    Elmarie Shiley, MD 02/15/2022, 9:11 AM

## 2022-02-15 NOTE — Progress Notes (Signed)
eLink Physician-Brief Progress Note Patient Name: Alyssa Ingram DOB: 10/05/1945 MRN: [id number]   Date of Service  02/15/2022  HPI/Events of Note  Hemoglobin confirmed 6.1 gm  / dl. No evidence of active bleeding.  eICU Interventions  One unit of PRBC ordered transfused.        Kerry Kass Dejha King 02/15/2022, 6:12 AM

## 2022-02-15 NOTE — Progress Notes (Signed)
NAME:  JAMEA ROBICHEAUX, MRN:  [id number], DOB:  1945-06-09, LOS: 45 ADMISSION DATE:  01/19/2022, CONSULTATION DATE:  01/14/2022 REFERRING MD:  Leonette Monarch - EDP CHIEF COMPLAINT:  PEA arrest  History of Present Illness:  76 year old woman who presented to [facility] ED 9/18 post-PEA arrest. PMHx significant for HTN, HLD, HFpEF, HOCM, asthma, T2DM, GERD, obesity.   History is provided by son who lives with the patient.   She has been feeling poorly, including weakness and malaise for several days.  Chronic leg and foot pain was worse than usual.  This was followed by several days of abdominal discomfort, though no diarrhea or nausea or vomiting.  She was incontinent of stool once which is unusual for her.  9/17 she also developed neck pain.  She usually sleeps laying flat and was able to sleep Saturday night. Since yesterday, they have seen her leaning over the sink and counter bracing herself.    She was feeling worse, becoming weaker and they had decided to take her to the ED, when she started to get worse and more altered.  911 called, and she had completely lost consciousness at that time. When EMS arrived, breathing was agonal and she was pulseless. CPR x 22 min with ROSC, King airway in the field.    Intubated on arrival to ED. Neurologically, she was doing enough to fight the tube that she did receive doses of sedation in ED. Required pressor initiation (Levophed) and broad-spectrum antibiotic coverage with Vanc/Zosyn.  On 9/19, patient was noted to be waking up and following commands. She was extubated 9/20; shortly after extubation she required reintubation for upper airway edema. On 9/21, noted to be less responsive. Underwent MRI Brain 9/22 which showed multiple embolic/septic strokes, raising concern for endocarditis. TEE 9/25 demonstrating highly mobile valvular vegetation on MV c/w endocarditis.  Pertinent Medical History:   Past Medical History:  Diagnosis Date   Abnormal MRI, spine 11/2011    ?discitis L5-S1, s/p CT bx 12/12/11   Allergic rhinitis, cause unspecified 01/31/2013   Arthritis    Asthma    Cervical cancer ([place]) 1980   Chronic diastolic heart failure (HCC)    Diabetic retinopathy ([place])    GERD (gastroesophageal reflux disease)    H/O cardiovascular stress test    Nuclear study in 2011 normal perfusion   HOCM (hypertrophic obstructive cardiomyopathy) ([place])    Hyperlipidemia    Hypertension    Kidney stones    Mitral regurgitation    Mitral stenosis    Obesity    Premature atrial contractions    PVC's (premature ventricular contractions)    Type II or unspecified type diabetes mellitus without mention of complication, uncontrolled 01/31/2013   Significant Hospital Events: Including procedures, antibiotic start and stop dates in addition to other pertinent events   9/18 Admitted to ICU 9/19 waking up, following commands.  Weaning vasopressors. 9/20 Diuresed, passed SBT and extubated, but reintubated for upper airway edema. 9/21 less responsive on no sedation. MRI, EEG ordered. 9/22  MRI which showed multiple embolic/septic strokes. 9/25 Marginally responsive. TEE completed with +highly mobile MV vegetation c/w endocarditis. 9/26 Grossly unchanged neurologic exam. [place] conversation started with family 9/27 ongoing family discussions 9/28 failed SBT 2/2 significant resp distress, amio stopped given bradycardia 10/2 proned and paralyzed, IV's infiltrated, on three pressors, worsening renal failure 10/3 supinated yesterday, oxygenation improved, pressor requirement decreased however developing oliguric renal failure 10/4: Renal function worsening  Interim History / Subjective:  Unresponsive still on high-dose pressors  Objective:  Blood pressure (Abnormal) 117/58, pulse 83, temperature 98 F (36.7 C), resp. rate (Abnormal) 32, height 5' (1.524 m), weight 105.1 kg, SpO2 99 %.    Vent Mode: PRVC FiO2 (%):  [60 %] 60 % Set Rate:  [35 bmp] 35 bmp Vt Set:  [360  mL] 360 mL PEEP:  [16 cmH20] 16 cmH20 Plateau Pressure:  [33 cmH20-34 cmH20] 34 cmH20   Intake/Output Summary (Last 24 hours) at 02/15/2022 0737 Last data filed at 02/15/2022 7793 Gross per 24 hour  Intake 3339.26 ml  Output 16 ml  Net 3323.26 ml   Filed Weights   02/13/22 0500 02/14/22 0512 02/15/22 0345  Weight: 99.7 kg 100.6 kg 105.1 kg   General: Critically ill 76 year old female who remains in shock HEENT: Orally intubated mucous membranes moist Pulmonary: Diminished bilaterally Cardiac: Regular rate and rhythm Abdomen: Soft, Perc drain with bilious output Neuro: GCS 4, I can elicit cough with suction, pupils equal reactive GU minimal urine output Extremities warm generalized edema Assessment & Plan:   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  Hypervolemia   -seen by renal; not a dialysis candidate, CRRT would further drop BP and not improve outcome -acidosis and renal fxn cont to worsen Plan Cont VT bicarb Am labs Avoid nephrotoxins. Unfortunately nothing else to offer   Paroxysmal Afib - AC on hold given inflammatory milieu in CNS and high risk for hemorrhagic conversion; Plan Cont tele Holding bb and amio d/t bradycardia   Thrombocytopenia, likely 2/2 antibiotics or sepsis. 4T score=4, intermediate probability. -HIT ab positive, discussed with  pharmacy, SRA pending Plan Holding  chemical DVT prophylaxis d/t embolic strokes  Anemia Plan Trend  Trigger for xfusion < 7 (getting blood today for hgb 6.1)    DM2 with Hyperglycemia- stable Plan Ssi goal 140-180 Add TF coverage   Stage 1 DTI sacrum Plan Cont wound care     Best Practice: (right click and "Reselect all SmartList Selections" daily)   Diet/type: tube feeds DVT prophylaxis: SCD GI prophylaxis: PPI Lines: line holiday Foley:  Foley Code Status:  full code Last date of multidisciplinary goals of care discussion [10/2, family updated by Dr. Lamonte Sakai today] Son updated at bedside  CRITICAL CARE  32 minutes Erick Colace ACNP-BC [place] Pager # [phone] OR # [phone] if no answer

## 2022-02-16 DIAGNOSIS — I469 Cardiac arrest, cause unspecified: Secondary | ICD-10-CM | POA: Diagnosis not present

## 2022-02-16 DIAGNOSIS — J80 Acute respiratory distress syndrome: Secondary | ICD-10-CM | POA: Diagnosis not present

## 2022-02-16 DIAGNOSIS — N179 Acute kidney failure, unspecified: Secondary | ICD-10-CM | POA: Diagnosis not present

## 2022-02-16 DIAGNOSIS — R7881 Bacteremia: Secondary | ICD-10-CM | POA: Diagnosis not present

## 2022-02-16 DIAGNOSIS — J9601 Acute respiratory failure with hypoxia: Secondary | ICD-10-CM | POA: Diagnosis not present

## 2022-02-16 DIAGNOSIS — I330 Acute and subacute infective endocarditis: Secondary | ICD-10-CM | POA: Diagnosis not present

## 2022-02-16 LAB — BASIC METABOLIC PANEL
Anion gap: 22 — ABNORMAL HIGH (ref 5–15)
Anion gap: 21 — ABNORMAL HIGH (ref 5–15)
BUN: 135 mg/dL — ABNORMAL HIGH (ref 8–23)
BUN: 134 mg/dL — ABNORMAL HIGH (ref 8–23)
CO2: 16 mmol/L — ABNORMAL LOW (ref 22–32)
CO2: 17 mmol/L — ABNORMAL LOW (ref 22–32)
Calcium: 8.1 mg/dL — ABNORMAL LOW (ref 8.9–10.3)
Calcium: 7.8 mg/dL — ABNORMAL LOW (ref 8.9–10.3)
Chloride: 93 mmol/L — ABNORMAL LOW (ref 98–111)
Chloride: 92 mmol/L — ABNORMAL LOW (ref 98–111)
Creatinine, Ser: 3.86 mg/dL — ABNORMAL HIGH (ref 0.44–1.00)
Creatinine, Ser: 4.06 mg/dL — ABNORMAL HIGH (ref 0.44–1.00)
GFR, Estimated: 12 mL/min — ABNORMAL LOW (ref >60)
GFR, Estimated: 11 mL/min — ABNORMAL LOW (ref >60)
Glucose, Bld: 262 mg/dL — ABNORMAL HIGH (ref 70–99)
Glucose, Bld: 246 mg/dL — ABNORMAL HIGH (ref 70–99)
Potassium: 4.7 mmol/L (ref 3.5–5.1)
Potassium: 4.6 mmol/L (ref 3.5–5.1)
Sodium: 131 mmol/L — ABNORMAL LOW (ref 135–145)
Sodium: 130 mmol/L — ABNORMAL LOW (ref 135–145)

## 2022-02-16 LAB — CBC WITH DIFFERENTIAL/PLATELET
Abs Immature Granulocytes: 0.38 10*3/uL — ABNORMAL HIGH (ref 0.00–0.07)
Basophils Absolute: 0 10*3/uL (ref 0.0–0.1)
Basophils Relative: 0 %
Eosinophils Absolute: 0 10*3/uL (ref 0.0–0.5)
Eosinophils Relative: 0 %
HCT: 19.8 % — ABNORMAL LOW (ref 36.0–46.0)
Hemoglobin: 6.8 g/dL — CL (ref 12.0–15.0)
Immature Granulocytes: 2 %
Lymphocytes Relative: 2 %
Lymphs Abs: 0.5 10*3/uL — ABNORMAL LOW (ref 0.7–4.0)
MCH: 29.3 pg (ref 26.0–34.0)
MCHC: 34.3 g/dL (ref 30.0–36.0)
MCV: 85.3 fL (ref 80.0–100.0)
Monocytes Absolute: 1.1 10*3/uL — ABNORMAL HIGH (ref 0.1–1.0)
Monocytes Relative: 4 %
Neutro Abs: 22.6 10*3/uL — ABNORMAL HIGH (ref 1.7–7.7)
Neutrophils Relative %: 92 %
Platelets: 84 10*3/uL — ABNORMAL LOW (ref 150–400)
RBC: 2.32 MIL/uL — ABNORMAL LOW (ref 3.87–5.11)
RDW: 23.5 % — ABNORMAL HIGH (ref 11.5–15.5)
WBC: 24.5 10*3/uL — ABNORMAL HIGH (ref 4.0–10.5)
nRBC: 4.2 % — ABNORMAL HIGH (ref 0.0–0.2)

## 2022-02-16 LAB — HEMOGLOBIN AND HEMATOCRIT, BLOOD
HCT: 25.2 % — ABNORMAL LOW (ref 36.0–46.0)
Hemoglobin: 8.2 g/dL — ABNORMAL LOW (ref 12.0–15.0)

## 2022-02-16 LAB — COMPREHENSIVE METABOLIC PANEL
ALT: 14 U/L (ref 0–44)
AST: 16 U/L (ref 15–41)
Albumin: 2.5 g/dL — ABNORMAL LOW (ref 3.5–5.0)
Alkaline Phosphatase: 69 U/L (ref 38–126)
Anion gap: 23 — ABNORMAL HIGH (ref 5–15)
BUN: 144 mg/dL — ABNORMAL HIGH (ref 8–23)
CO2: 17 mmol/L — ABNORMAL LOW (ref 22–32)
Calcium: 8 mg/dL — ABNORMAL LOW (ref 8.9–10.3)
Chloride: 92 mmol/L — ABNORMAL LOW (ref 98–111)
Creatinine, Ser: 4.21 mg/dL — ABNORMAL HIGH (ref 0.44–1.00)
GFR, Estimated: 10 mL/min — ABNORMAL LOW (ref >60)
Glucose, Bld: 210 mg/dL — ABNORMAL HIGH (ref 70–99)
Potassium: 4.7 mmol/L (ref 3.5–5.1)
Sodium: 132 mmol/L — ABNORMAL LOW (ref 135–145)
Total Bilirubin: 1.1 mg/dL (ref 0.3–1.2)
Total Protein: 6.5 g/dL (ref 6.5–8.1)

## 2022-02-16 LAB — SEROTONIN RELEASE ASSAY (SRA)
SRA .2 IU/mL UFH Ser-aCnc: 93 % — ABNORMAL HIGH (ref 0–20)
SRA 100IU/mL UFH Ser-aCnc: 10 % (ref 0–20)

## 2022-02-16 LAB — OCCULT BLOOD X 1 CARD TO LAB, STOOL: Fecal Occult Bld: POSITIVE — AB

## 2022-02-16 LAB — GLUCOSE, CAPILLARY
Glucose-Capillary: 190 mg/dL — ABNORMAL HIGH (ref 70–99)
Glucose-Capillary: 188 mg/dL — ABNORMAL HIGH (ref 70–99)
Glucose-Capillary: 187 mg/dL — ABNORMAL HIGH (ref 70–99)
Glucose-Capillary: 128 mg/dL — ABNORMAL HIGH (ref 70–99)
Glucose-Capillary: 117 mg/dL — ABNORMAL HIGH (ref 70–99)

## 2022-02-16 LAB — PREPARE RBC (CROSSMATCH)

## 2022-02-16 MED ORDER — IPRATROPIUM-ALBUTEROL 0.5-2.5 (3) MG/3ML IN SOLN
3.0000 mL | RESPIRATORY_TRACT | Status: DC | PRN
  Administered 2022-02-17: 3 mL via RESPIRATORY_TRACT
  Filled 2022-02-16: qty 3

## 2022-02-16 MED ORDER — INSULIN ASPART 100 UNIT/ML IJ SOLN
9.0000 [IU] | INTRAMUSCULAR | Status: DC
  Administered 2022-02-16 – 2022-02-17 (×5): 9 [IU] via SUBCUTANEOUS

## 2022-02-16 MED ORDER — INSULIN ASPART 100 UNIT/ML IJ SOLN
8.0000 [IU] | INTRAMUSCULAR | Status: DC
  Administered 2022-02-16: 8 [IU] via SUBCUTANEOUS

## 2022-02-16 MED ORDER — SODIUM CHLORIDE 0.9% IV SOLUTION: Freq: Once | INTRAVENOUS | Status: AC

## 2022-02-16 NOTE — Progress Notes (Signed)
NAME:  Alyssa Ingram, MRN:  [id number], DOB:  1946-01-08, LOS: 20 ADMISSION DATE:  01/21/2022, CONSULTATION DATE:  02/03/2022 REFERRING MD:  Leonette Monarch - EDP CHIEF COMPLAINT:  PEA arrest  History of Present Illness:  76 year old woman who presented to [facility] ED 9/18 post-PEA arrest. PMHx significant for HTN, HLD, HFpEF, HOCM, asthma, T2DM, GERD, obesity.   History is provided by son who lives with the patient.   She has been feeling poorly, including weakness and malaise for several days.  Chronic leg and foot pain was worse than usual.  This was followed by several days of abdominal discomfort, though no diarrhea or nausea or vomiting.  She was incontinent of stool once which is unusual for her.  9/17 she also developed neck pain.  She usually sleeps laying flat and was able to sleep Saturday night. Since yesterday, they have seen her leaning over the sink and counter bracing herself.    She was feeling worse, becoming weaker and they had decided to take her to the ED, when she started to get worse and more altered.  911 called, and she had completely lost consciousness at that time. When EMS arrived, breathing was agonal and she was pulseless. CPR x 22 min with ROSC, King airway in the field.    Intubated on arrival to ED. Neurologically, she was doing enough to fight the tube that she did receive doses of sedation in ED. Required pressor initiation (Levophed) and broad-spectrum antibiotic coverage with Vanc/Zosyn.  On 9/19, patient was noted to be waking up and following commands. She was extubated 9/20; shortly after extubation she required reintubation for upper airway edema. On 9/21, noted to be less responsive. Underwent MRI Brain 9/22 which showed multiple embolic/septic strokes, raising concern for endocarditis. TEE 9/25 demonstrating highly mobile valvular vegetation on MV c/w endocarditis.  Pertinent Medical History:   Past Medical History:  Diagnosis Date   Abnormal MRI, spine 11/2011    ?discitis L5-S1, s/p CT bx 12/12/11   Allergic rhinitis, cause unspecified 01/31/2013   Arthritis    Asthma    Cervical cancer ([place]) 1980   Chronic diastolic heart failure (HCC)    Diabetic retinopathy ([place])    GERD (gastroesophageal reflux disease)    H/O cardiovascular stress test    Nuclear study in 2011 normal perfusion   HOCM (hypertrophic obstructive cardiomyopathy) ([place])    Hyperlipidemia    Hypertension    Kidney stones    Mitral regurgitation    Mitral stenosis    Obesity    Premature atrial contractions    PVC's (premature ventricular contractions)    Type II or unspecified type diabetes mellitus without mention of complication, uncontrolled 01/31/2013   Significant Hospital Events: Including procedures, antibiotic start and stop dates in addition to other pertinent events   9/18 Admitted to ICU 9/19 waking up, following commands.  Weaning vasopressors. 9/20 Diuresed, passed SBT and extubated, but reintubated for upper airway edema. 9/21 less responsive on no sedation. MRI, EEG ordered. 9/22  MRI which showed multiple embolic/septic strokes. 9/25 Marginally responsive. TEE completed with +highly mobile MV vegetation c/w endocarditis. 9/26 Grossly unchanged neurologic exam. [place] conversation started with family 9/27 ongoing family discussions 9/28 failed SBT 2/2 significant resp distress, amio stopped given bradycardia 10/2 proned and paralyzed, IV's infiltrated, on three pressors, worsening renal failure 10/3 supinated yesterday, oxygenation improved, pressor requirement decreased however developing oliguric renal failure 10/4: Renal function worsening. Completed Zyvox  10/5 renal function continues to decline  Interim History /  Subjective:  No changes.  She is having frequent episodes of bradycardia  Objective:  Blood pressure 119/60, pulse 95, temperature 98 F (36.7 C), temperature source Oral, resp. rate (Abnormal) 35, height 5' (1.524 m), weight 108.3 kg, SpO2  97 %.    Vent Mode: PRVC FiO2 (%):  [60 %] 60 % Set Rate:  [35 bmp] 35 bmp Vt Set:  [360 mL] 360 mL PEEP:  [14 cmH20] 14 cmH20 Plateau Pressure:  [22 cmH20-33 cmH20] 30 cmH20   Intake/Output Summary (Last 24 hours) at 02/16/2022 0754 Last data filed at 02/16/2022 0600 Gross per 24 hour  Intake 2501.81 ml  Output 900 ml  Net 1601.81 ml   Filed Weights   02/14/22 0512 02/15/22 0345 02/16/22 0449  Weight: 100.6 kg 105.1 kg 108.3 kg   General 76 year old female Patient currently minimally responsive on mechanical ventilator HEENT normocephalic atraumatic no jugular venous distention appreciated orally intubated Pulmonary: Coarse scattered rhonchi still on fairly high ventilatory support Cardiac: Regular rate and rhythm still pressor dependent Abdomen: Soft not tender positive bowel sounds GU: An uric Neuro: Unresponsive still has cough  Assessment & Plan:   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 Plan is to complete 6wk rx    Prolonged OHCA- c/b Acute metabolic encephalopathy 2/2 septic emboli and worsening uremia had initially recovered, was talking following commands prior to showering of emboli  Plan Supportive care    Acute respiratory failure with hypoxia c/b VAP (NOS) History of asthma, no current wheezing Plan Cont full vent support  VAP bundle PAD protocol  Cont BDs Not trach candidate   AKI with oliguria, UOP started to drop off 9/29.  Hypervolemia   -seen by renal; not a dialysis candidate, CRRT would further drop BP and not improve outcome -acidosis and renal fxn cont to worsen Plan Cont VT bicarb avoid nephrotoxins if able Am chem  [place] I&O  Paroxysmal Afib - AC on hold given inflammatory milieu in CNS and high risk for hemorrhagic conversion; Plan Holding bb due to hypotension  tele  Thrombocytopenia, likely 2/2 antibiotics or sepsis. 4T score=4, intermediate  probability. -HIT ab positive, discussed with pharmacy, SRA pending Plan Holding [facility] given embolic stroke   Anemia Plan Got blood 10/4, still < 7 another ordered.  Will back down on CBC trending to limit blood draws and limit xfusion    DM2 with Hyperglycemia- stable Plan Ssi goal 140-180  Inc TF coverage to 8 q 4  Stage 1 DTI sacrum Plan Wound care     Best Practice: (right click and "Reselect all SmartList Selections" daily)   Diet/type: tube feeds DVT prophylaxis: SCD GI prophylaxis: PPI Lines: line holiday Foley:  Foley Code Status:  full code Last date of multidisciplinary goals of care discussion [10/2, family updated by Dr. Lamonte Sakai today] Family updated at bedside  [place] ACNP-BC [place] Pager # [phone] OR # [phone] if no answer

## 2022-02-16 NOTE — Progress Notes (Signed)
Patient ID: [name], female   DOB: [birth year], 76 y.o.   MRN: [id number]    Progress Note from the Palliative Medicine Team at [facility]   Patient Name: Alyssa Ingram        Date: 02/16/2022 DOB: 01-30-1946  Age: 76 y.o. MRN#: [id number] Attending Physician: Collene Gobble, MD Primary Care Physician: [name], MD Admit Date: 01/27/2022   Medical records reviewed   Per intake H&P --> 68 year old woman who presented to [facility] ED 9/18 post-PEA arrest. PMHx significant for HTN, HLD, HFpEF, HOCM, asthma, T2DM, GERD, obesity. Patient s/p arrest and endured CPR x22 minutes. S/P two intubations. Patient has suffered multiple embolic/septic strokes, in the setting of endocarditis. Palliative care has been asked to get involved to further assist in goals of care conversations.  Initial palliative consult was completed on 02/10/2022.  Family is clear and goals are set.  Family is open to all offered and available medical interventions to prolong life.  This NP assessed patient at the bedside, no family at bedside .   Discussed case with CCM/Pete Kary Kos, NP.    CCM is working closely with family.  Ongoing conversations regarding the seriousness of current medical situation.  Patient with worsening renal function. Goals remain set and clearly stated by family.    Palliative medicine to sign off at this time.  Critical care will continue to be the main source of communication for the family.  CCM will reconsult [place] if needed.  Discussed with Marni Griffon NP  Total time spent on the unit was 25 minutes time in 11:00 time out 1125.  Greater than 50% of the time was spent in counseling and coordination of care.   Wadie Lessen NP  Palliative Medicine Team Team Phone # [phone] Pager [phone]

## 2022-02-16 NOTE — Progress Notes (Signed)
Patient ID: Alyssa Ingram, female   DOB: 06-Jul-1945, 76 y.o.   MRN: [id number] [facility] Progress Note   Assessment/ Plan:   1.  Acute kidney injury: Likely from ischemic ATN in the setting of sepsis/shock.  Remains anuric and with worsening renal function/azotemia noted on labs-no response to furosemide bolus overnight. I have discussed my findings and the current clinical status/decision making with her daughter Ashby Dawes and Audry Pili at bedside (and brother [name] on the phone) that CRRT would be ill advised as it will not make any improvement to her overall prognosis and indeed may hasten mortality.   2.  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  On stress dose steroids and antimicrobial therapy with ampicillin and high-dose ceftriaxone. 3.  Acute encephalopathy: Appears to be multifactorial including septic cerebral infarcts and possibly hypoxic injury from initial arrest. 4.  Acute hypoxic respiratory failure: Arterial blood gas from this morning shows mixed respiratory and metabolic acidosis.  Ventilator adjustments per CCM. 5.  Anemia: With acute hemoglobin drop overnight and she is status post PRBC transfusion.  Subjective:   Without acute events noted overnight   Objective:   BP 119/60   Pulse 95   Temp 98 F (36.7 C) (Oral)   Resp (!) 35   Ht 5' (1.524 m)   Wt 108.3 kg   SpO2 97%   BMI 46.63 kg/m   Intake/Output Summary (Last 24 hours) at 02/16/2022 0854 Last data filed at 02/16/2022 0600 Gross per 24 hour  Intake 2501.81 ml  Output 900 ml  Net 1601.81 ml   Weight change: 3.2 kg  Physical Exam: Gen: Intubated, sedated, appears comfortable CVS: Pulse regular rhythm, normal rate, S1 and S2 normal Resp: Coarse breath sounds bilaterally, no rales/wheeze Abd: Soft, obese, nontender Ext: 2+ upper extremity edema, lower extremity edema appreciated  Imaging: No results  found.  Labs: BMET Recent Labs  Lab 02/10/22 0509 02/11/22 0501 02/11/22 1512 02/12/22 0842 02/12/22 1539 02/13/22 0423 02/13/22 0650 02/13/22 1629 02/14/22 0056 02/14/22 0200 02/14/22 0338 02/14/22 0458 02/15/22 0341 02/15/22 0343 02/15/22 1440 02/16/22 0407  NA 143 134*   < > 136   < > 135   < > 134*   < > 131* 134* 131* 131* 129* 130* 132*  K 4.0 4.4   < > 4.7   < > 5.3*   < > 5.2*   < > 5.1 5.2* 5.1 4.7 4.6 4.6 4.7  CL 108 96*   < > 99  --  99  --  97*  --   --  93*  --  93*  --  92* 92*  CO2 23 19*   < > 18*  --  19*  --  20*  --   --  18*  --  16*  --  17* 17*  GLUCOSE 100* 150*   < > 151*  --  182*  --  212*  --   --  152*  --  262*  --  246* 210*  BUN 86* 90*   < > 107*  --  114*  --  118*  --   --  118*  --  135*  --  134* 144*  CREATININE 1.57* 1.82*   < > 2.61*  --  3.15*  --  3.34*  --   --  3.33*  --  3.86*  --  4.06* 4.21*  CALCIUM 8.8* 8.5*   < >  8.7*  --  8.4*  --  8.2*  --   --  8.4*  --  8.1*  --  7.8* 8.0*  PHOS 6.3* 7.5*  --  9.2*  --  >30.0*  --   --   --   --  11.7*  --   --   --   --   --    < > = values in this interval not displayed.   CBC Recent Labs  Lab 02/14/22 0338 02/14/22 0441 02/15/22 0341 02/15/22 0343 02/15/22 0458 02/15/22 1330 02/16/22 0407  WBC 35.7*  --  28.2*  --  28.4*  --  24.5*  NEUTROABS 32.4*  --  25.9*  --  26.1*  --  22.6*  HGB 6.8*   < > 6.1* 7.5* 6.1* 7.1* 6.8*  HCT 20.8*   < > 19.5* 22.0* 19.0* 21.1* 19.8*  MCV 87.4  --  88.6  --  88.0  --  85.3  PLT 56*  --  78*  --  77*  --  84*   < > = values in this interval not displayed.    Medications:     sodium chloride   Intravenous Once   artificial tears  1 Application Both Eyes T5T   Chlorhexidine Gluconate Cloth  6 each Topical Daily   etomidate  20 mg Intravenous Once   feeding supplement (PROSource TF20)  60 mL Per Tube BID   fentaNYL (SUBLIMAZE) injection  200 mcg Intravenous Once   hydrocerin   Topical BID   hydrocortisone sod succinate (SOLU-CORTEF) inj   100 mg Intravenous Q8H   insulin aspart  0-15 Units Subcutaneous Q4H   insulin aspart  8 Units Subcutaneous Q4H   ipratropium-albuterol  3 mL Nebulization Q6H   lidocaine  2 patch Transdermal Q24H   midazolam  5 mg Intravenous Once   mouth rinse  15 mL Mouth Rinse Q2H   pantoprazole  40 mg Per Tube Daily   patiromer  8.4 g Oral Q8H   sodium bicarbonate  1,300 mg Per Tube TID   sodium chloride flush  10-40 mL Intracatheter Q12H   Thrombi-Pad  1 each Topical Once    Elmarie Shiley, MD 02/16/2022, 8:54 AM

## 2022-02-16 NOTE — Progress Notes (Signed)
eLink Physician-Brief Progress Note Patient Name: Alyssa Ingram DOB: Jul 17, 1945 MRN: [id number]   Date of Service  02/16/2022  HPI/Events of Note  Hemoglobin 6.8 gm / dl, no overt evidence of active bleeding.  eICU Interventions  Verify that she is on PPI, Transfuse one unit of PRBC, stool occult blood test.        Kerry Kass Eilis Chestnutt 02/16/2022, 5:04 AM

## 2022-02-17 ENCOUNTER — Inpatient Hospital Stay (HOSPITAL_COMMUNITY): Payer: Medicare Other

## 2022-02-17 DIAGNOSIS — G9340 Encephalopathy, unspecified: Secondary | ICD-10-CM | POA: Diagnosis not present

## 2022-02-17 DIAGNOSIS — J9601 Acute respiratory failure with hypoxia: Secondary | ICD-10-CM | POA: Diagnosis not present

## 2022-02-17 DIAGNOSIS — I469 Cardiac arrest, cause unspecified: Secondary | ICD-10-CM | POA: Diagnosis not present

## 2022-02-17 DIAGNOSIS — R7881 Bacteremia: Secondary | ICD-10-CM | POA: Diagnosis not present

## 2022-02-17 LAB — POCT I-STAT 7, (LYTES, BLD GAS, ICA,H+H)
Acid-base deficit: 11 mmol/L — ABNORMAL HIGH (ref 0.0–2.0)
Acid-base deficit: 13 mmol/L — ABNORMAL HIGH (ref 0.0–2.0)
Acid-base deficit: 12 mmol/L — ABNORMAL HIGH (ref 0.0–2.0)
Bicarbonate: 17 mmol/L — ABNORMAL LOW (ref 20.0–28.0)
Bicarbonate: 15.8 mmol/L — ABNORMAL LOW (ref 20.0–28.0)
Bicarbonate: 17 mmol/L — ABNORMAL LOW (ref 20.0–28.0)
Calcium, Ion: 1.06 mmol/L — ABNORMAL LOW (ref 1.15–1.40)
Calcium, Ion: 1.08 mmol/L — ABNORMAL LOW (ref 1.15–1.40)
Calcium, Ion: 1.08 mmol/L — ABNORMAL LOW (ref 1.15–1.40)
HCT: 26 % — ABNORMAL LOW (ref 36.0–46.0)
HCT: 27 % — ABNORMAL LOW (ref 36.0–46.0)
HCT: 25 % — ABNORMAL LOW (ref 36.0–46.0)
Hemoglobin: 8.8 g/dL — ABNORMAL LOW (ref 12.0–15.0)
Hemoglobin: 9.2 g/dL — ABNORMAL LOW (ref 12.0–15.0)
Hemoglobin: 8.5 g/dL — ABNORMAL LOW (ref 12.0–15.0)
O2 Saturation: 93 %
O2 Saturation: 83 %
O2 Saturation: 87 %
Patient temperature: 37.6
Patient temperature: 37.6
Patient temperature: 37.6
Potassium: 4.7 mmol/L (ref 3.5–5.1)
Potassium: 4.8 mmol/L (ref 3.5–5.1)
Potassium: 4.7 mmol/L (ref 3.5–5.1)
Sodium: 131 mmol/L — ABNORMAL LOW (ref 135–145)
Sodium: 130 mmol/L — ABNORMAL LOW (ref 135–145)
Sodium: 130 mmol/L — ABNORMAL LOW (ref 135–145)
TCO2: 18 mmol/L — ABNORMAL LOW (ref 22–32)
TCO2: 17 mmol/L — ABNORMAL LOW (ref 22–32)
TCO2: 19 mmol/L — ABNORMAL LOW (ref 22–32)
pCO2 arterial: 49.7 mmHg — ABNORMAL HIGH (ref 32–48)
pCO2 arterial: 52.9 mmHg — ABNORMAL HIGH (ref 32–48)
pCO2 arterial: 55.9 mmHg — ABNORMAL HIGH (ref 32–48)
pH, Arterial: 7.145 — CL (ref 7.35–7.45)
pH, Arterial: 7.088 — CL (ref 7.35–7.45)
pH, Arterial: 7.095 — CL (ref 7.35–7.45)
pO2, Arterial: 88 mmHg (ref 83–108)
pO2, Arterial: 68 mmHg — ABNORMAL LOW (ref 83–108)
pO2, Arterial: 75 mmHg — ABNORMAL LOW (ref 83–108)

## 2022-02-17 LAB — BASIC METABOLIC PANEL
Anion gap: 23 — ABNORMAL HIGH (ref 5–15)
BUN: 153 mg/dL — ABNORMAL HIGH (ref 8–23)
CO2: 16 mmol/L — ABNORMAL LOW (ref 22–32)
Calcium: 8.4 mg/dL — ABNORMAL LOW (ref 8.9–10.3)
Chloride: 94 mmol/L — ABNORMAL LOW (ref 98–111)
Creatinine, Ser: 4.59 mg/dL — ABNORMAL HIGH (ref 0.44–1.00)
GFR, Estimated: 9 mL/min — ABNORMAL LOW (ref >60)
Glucose, Bld: 113 mg/dL — ABNORMAL HIGH (ref 70–99)
Potassium: 4.8 mmol/L (ref 3.5–5.1)
Sodium: 133 mmol/L — ABNORMAL LOW (ref 135–145)

## 2022-02-17 LAB — TYPE AND SCREEN
ABO/RH(D): O POS
Antibody Screen: NEGATIVE
Unit division: 0
Unit division: 0

## 2022-02-17 LAB — CBC WITH DIFFERENTIAL/PLATELET
Abs Immature Granulocytes: 0.5 10*3/uL — ABNORMAL HIGH (ref 0.00–0.07)
Basophils Absolute: 0 10*3/uL (ref 0.0–0.1)
Basophils Relative: 0 %
Eosinophils Absolute: 0 10*3/uL (ref 0.0–0.5)
Eosinophils Relative: 0 %
HCT: 23.4 % — ABNORMAL LOW (ref 36.0–46.0)
Hemoglobin: 7.6 g/dL — ABNORMAL LOW (ref 12.0–15.0)
Immature Granulocytes: 2 %
Lymphocytes Relative: 2 %
Lymphs Abs: 0.6 10*3/uL — ABNORMAL LOW (ref 0.7–4.0)
MCH: 28.5 pg (ref 26.0–34.0)
MCHC: 32.5 g/dL (ref 30.0–36.0)
MCV: 87.6 fL (ref 80.0–100.0)
Monocytes Absolute: 1.3 10*3/uL — ABNORMAL HIGH (ref 0.1–1.0)
Monocytes Relative: 5 %
Neutro Abs: 21.5 10*3/uL — ABNORMAL HIGH (ref 1.7–7.7)
Neutrophils Relative %: 91 %
Platelets: 86 10*3/uL — ABNORMAL LOW (ref 150–400)
RBC: 2.67 MIL/uL — ABNORMAL LOW (ref 3.87–5.11)
RDW: 23.1 % — ABNORMAL HIGH (ref 11.5–15.5)
WBC: 23.9 10*3/uL — ABNORMAL HIGH (ref 4.0–10.5)
nRBC: 7.2 % — ABNORMAL HIGH (ref 0.0–0.2)

## 2022-02-17 LAB — BPAM RBC
Blood Product Expiration Date: 202310312359
Blood Product Expiration Date: 202311032359
ISSUE DATE / TIME: 202310040801
ISSUE DATE / TIME: 202310050532
Unit Type and Rh: 5100
Unit Type and Rh: 5100

## 2022-02-17 LAB — GLUCOSE, CAPILLARY
Glucose-Capillary: 108 mg/dL — ABNORMAL HIGH (ref 70–99)
Glucose-Capillary: 120 mg/dL — ABNORMAL HIGH (ref 70–99)
Glucose-Capillary: 128 mg/dL — ABNORMAL HIGH (ref 70–99)
Glucose-Capillary: 130 mg/dL — ABNORMAL HIGH (ref 70–99)
Glucose-Capillary: 131 mg/dL — ABNORMAL HIGH (ref 70–99)
Glucose-Capillary: 139 mg/dL — ABNORMAL HIGH (ref 70–99)
Glucose-Capillary: 160 mg/dL — ABNORMAL HIGH (ref 70–99)

## 2022-02-17 LAB — HEMOGLOBIN AND HEMATOCRIT, BLOOD
HCT: 23.3 % — ABNORMAL LOW (ref 36.0–46.0)
Hemoglobin: 7.4 g/dL — ABNORMAL LOW (ref 12.0–15.0)

## 2022-02-17 MED ORDER — SODIUM BICARBONATE 650 MG PO TABS
2600.0000 mg | ORAL_TABLET | Freq: Three times a day (TID) | ORAL | Status: DC
  Administered 2022-02-17 – 2022-02-19 (×9): 2600 mg
  Filled 2022-02-17 (×7): qty 4

## 2022-02-17 MED ORDER — SODIUM BICARBONATE 8.4 % IV SOLN
100.0000 meq | Freq: Once | INTRAVENOUS | Status: AC
  Administered 2022-02-17: 100 meq via INTRAVENOUS
  Filled 2022-02-17: qty 50

## 2022-02-17 MED ORDER — INSULIN ASPART 100 UNIT/ML IJ SOLN
0.0000 [IU] | INTRAMUSCULAR | Status: DC
  Administered 2022-02-17: 2 [IU] via SUBCUTANEOUS
  Administered 2022-02-17 – 2022-02-19 (×4): 1 [IU] via SUBCUTANEOUS

## 2022-02-17 MED ORDER — INSULIN ASPART 100 UNIT/ML IJ SOLN
8.0000 [IU] | INTRAMUSCULAR | Status: DC
  Administered 2022-02-17 – 2022-02-19 (×7): 8 [IU] via SUBCUTANEOUS

## 2022-02-17 MED ORDER — FUROSEMIDE 10 MG/ML IJ SOLN
160.0000 mg | Freq: Once | INTRAVENOUS | Status: AC
  Administered 2022-02-17: 160 mg via INTRAVENOUS
  Filled 2022-02-17: qty 16

## 2022-02-17 NOTE — Progress Notes (Signed)
eLink Physician-Brief Progress Note Patient Name: Alyssa Ingram DOB: [birth year] MRN: [id number]   Date of Service  02/17/2022  HPI/Events of Note  ABG on 60%/PRVC 35/TV 360/P 10 = 7.145/49.7/88/17.0. Unable to increase PRVC rate. Already on NaHCO3 1300 mg per tube TID (next dose at 10 AM).  eICU Interventions  Plan: NaHCO3 100 meq IV now.  Repeat ABG at 12 noon.      Intervention Category Major Interventions: Acid-Base disturbance - evaluation and management;Respiratory failure - evaluation and management  Alyssa Ingram 02/17/2022, 5:55 AM

## 2022-02-17 NOTE — Progress Notes (Addendum)
eLink Physician-Brief Progress Note Patient Name: Alyssa Ingram DOB: 11-09-1945 MRN: [id number]   Date of Service  02/17/2022  HPI/Events of Note  Called d/t Sat dropping to 86% by oximetry on 75%/PRVC 35/TV 360/P 14.  There is not much to add at this point to improve the outcome. From Fenton Babcock's note today:  "Family updated at bedside, I have told them she will not survive. They continue to hold out hope. Unfortunately I think we will end up providing CPR for her which I believe is Futile. Will continue current care but I will be surprised if she survives day."  From Dr. Serita Grit (Nephrology) note today: "1.  Acute kidney injury: Likely from ischemic ATN in the setting of sepsis/shock.  Remains anuric and with worsening renal function/azotemia noted on labs.  I discussed labs, clinical findings and management/prognosis with her son [name] (and daughter Ashby Dawes on the phone).  Informed them that we would not offer dialysis/RRT as it will not make any improvement to her overall prognosis and indeed may hasten mortality." A portable CXR reveals progressive bilateral airspace disease with small bilateral pleural effusions. This could reflect edema or widespread pneumonia. BP = 132/54. Creatinine = 4.59. Repeat ABG on 75%/PRVC 35/TV 360/P 14 = 7.09/51/65/15.8. Patient is already pn NaHCO3 per tube.  eICU Interventions  Plan: Check CVP now.  Increase PEEP to 16.  Lasix 160 mg IV X 1.     Intervention Category Major Interventions: Hypoxemia - evaluation and management;Respiratory failure - evaluation and management  Sianni Cloninger Eugene 02/17/2022, 8:15 PM

## 2022-02-17 NOTE — Progress Notes (Signed)
Nurse notified Cardiology about Afib 120s at aprox 2100 02/16/22 Nurse notified eLink about labs/abg at aprox 0530 02/17/22

## 2022-02-17 NOTE — Progress Notes (Signed)
Patient ID: Alyssa Ingram, female   DOB: 1945/09/12, 76 y.o.   MRN: [id number] [facility] Progress Note   Assessment/ Plan:   1.  Acute kidney injury: Likely from ischemic ATN in the setting of sepsis/shock.  Remains anuric and with worsening renal function/azotemia noted on labs.  I discussed labs, clinical findings and management/prognosis with her son [name] (and daughter Ashby Dawes on the phone).  Informed them that we would not offer dialysis/RRT as it will not make any improvement to her overall prognosis and indeed may hasten mortality.   2.  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  On stress dose steroids and antimicrobial therapy with ampicillin and high-dose ceftriaxone. 3.  Acute encephalopathy: Appears to be multifactorial including septic cerebral infarcts and possibly hypoxic injury from initial arrest. 4.  Acute hypoxic respiratory failure: Arterial blood gas from this morning shows mixed respiratory and metabolic acidosis.  Ventilator adjustments per CCM. 5.  Anemia: With acute hemoglobin drop overnight and she is status post PRBC transfusion.  Subjective:   Without acute events noted overnight, acidotic on ABG this morning and got IV sodium bicarbonate.   Objective:   BP (!) 120/53   Pulse (!) 101   Temp 99.6 F (37.6 C) (Oral)   Resp (!) 35   Ht 5' (1.524 m)   Wt 112 kg   SpO2 95%   BMI 48.22 kg/m   Intake/Output Summary (Last 24 hours) at 02/17/2022 0813 Last data filed at 02/17/2022 0600 Gross per 24 hour  Intake 2299.95 ml  Output 438 ml  Net 1861.95 ml   Weight change: 3.7 kg  Physical Exam: Gen: Intubated, sedated  CVS: Pulse regular tachycardia, S1 and S2 normal Resp: Coarse breath sounds bilaterally, no rales/wheeze Abd: Soft, obese, nontender Ext: 2-3+ edema in upper and lower extremities with supportive ankle booties  Imaging: No results  found.  Labs: BMET Recent Labs  Lab 02/11/22 0501 02/11/22 1512 02/12/22 0842 02/12/22 1539 02/13/22 0423 02/13/22 0650 02/13/22 1629 02/14/22 0056 02/14/22 9211 02/14/22 0458 02/15/22 0341 02/15/22 0343 02/15/22 1440 02/16/22 0407 02/17/22 0355 02/17/22 0525  NA 134*   < > 136   < > 135   < > 134*   < > 134* 131* 131* 129* 130* 132* 133* 131*  K 4.4   < > 4.7   < > 5.3*   < > 5.2*   < > 5.2* 5.1 4.7 4.6 4.6 4.7 4.8 4.7  CL 96*   < > 99  --  99  --  97*  --  93*  --  93*  --  92* 92* 94*  --   CO2 19*   < > 18*  --  19*  --  20*  --  18*  --  16*  --  17* 17* 16*  --   GLUCOSE 150*   < > 151*  --  182*  --  212*  --  152*  --  262*  --  246* 210* 113*  --   BUN 90*   < > 107*  --  114*  --  118*  --  118*  --  135*  --  134* 144* 153*  --   CREATININE 1.82*   < > 2.61*  --  3.15*  --  3.34*  --  3.33*  --  3.86*  --  4.06* 4.21* 4.59*  --   CALCIUM 8.5*   < >  8.7*  --  8.4*  --  8.2*  --  8.4*  --  8.1*  --  7.8* 8.0* 8.4*  --   PHOS 7.5*  --  9.2*  --  >30.0*  --   --   --  11.7*  --   --   --   --   --   --   --    < > = values in this interval not displayed.   CBC Recent Labs  Lab 02/15/22 0341 02/15/22 0343 02/15/22 0458 02/15/22 1330 02/16/22 0407 02/16/22 1059 02/17/22 0355 02/17/22 0525  WBC 28.2*  --  28.4*  --  24.5*  --  23.9*  --   NEUTROABS 25.9*  --  26.1*  --  22.6*  --  21.5*  --   HGB 6.1*   < > 6.1*   < > 6.8* 8.2* 7.6* 8.8*  HCT 19.5*   < > 19.0*   < > 19.8* 25.2* 23.4* 26.0*  MCV 88.6  --  88.0  --  85.3  --  87.6  --   PLT 78*  --  77*  --  84*  --  86*  --    < > = values in this interval not displayed.    Medications:     sodium chloride   Intravenous Once   artificial tears  1 Application Both Eyes N6E   Chlorhexidine Gluconate Cloth  6 each Topical Daily   etomidate  20 mg Intravenous Once   feeding supplement (PROSource TF20)  60 mL Per Tube BID   fentaNYL (SUBLIMAZE) injection  200 mcg Intravenous Once   hydrocerin   Topical BID    hydrocortisone sod succinate (SOLU-CORTEF) inj  100 mg Intravenous Q8H   insulin aspart  0-15 Units Subcutaneous Q4H   insulin aspart  9 Units Subcutaneous Q4H   lidocaine  2 patch Transdermal Q24H   midazolam  5 mg Intravenous Once   mouth rinse  15 mL Mouth Rinse Q2H   pantoprazole  40 mg Per Tube Daily   patiromer  8.4 g Oral Q8H   sodium bicarbonate  1,300 mg Per Tube TID   sodium chloride flush  10-40 mL Intracatheter Q12H   Thrombi-Pad  1 each Topical Once    Elmarie Shiley, MD 02/17/2022, 8:13 AM

## 2022-02-17 NOTE — Progress Notes (Signed)
NAME:  Alyssa Ingram, MRN:  [id number], DOB:  Sep 20, 1945, LOS: 98 ADMISSION DATE:  01/17/2022, CONSULTATION DATE:  01/21/2022 REFERRING MD:  Leonette Monarch - EDP CHIEF COMPLAINT:  PEA arrest  History of Present Illness:  76 year old woman who presented to [facility] ED 9/18 post-PEA arrest. PMHx significant for HTN, HLD, HFpEF, HOCM, asthma, T2DM, GERD, obesity.   History is provided by son who lives with the patient.   She has been feeling poorly, including weakness and malaise for several days.  Chronic leg and foot pain was worse than usual.  This was followed by several days of abdominal discomfort, though no diarrhea or nausea or vomiting.  She was incontinent of stool once which is unusual for her.  9/17 she also developed neck pain.  She usually sleeps laying flat and was able to sleep Saturday night. Since yesterday, they have seen her leaning over the sink and counter bracing herself.    She was feeling worse, becoming weaker and they had decided to take her to the ED, when she started to get worse and more altered.  911 called, and she had completely lost consciousness at that time. When EMS arrived, breathing was agonal and she was pulseless. CPR x 22 min with ROSC, King airway in the field.    Intubated on arrival to ED. Neurologically, she was doing enough to fight the tube that she did receive doses of sedation in ED. Required pressor initiation (Levophed) and broad-spectrum antibiotic coverage with Vanc/Zosyn.  On 9/19, patient was noted to be waking up and following commands. She was extubated 9/20; shortly after extubation she required reintubation for upper airway edema. On 9/21, noted to be less responsive. Underwent MRI Brain 9/22 which showed multiple embolic/septic strokes, raising concern for endocarditis. TEE 9/25 demonstrating highly mobile valvular vegetation on MV c/w endocarditis.  Pertinent Medical History:   Past Medical History:  Diagnosis Date   Abnormal MRI, spine 11/2011    ?discitis L5-S1, s/p CT bx 12/12/11   Allergic rhinitis, cause unspecified 01/31/2013   Arthritis    Asthma    Cervical cancer ([place]) 1980   Chronic diastolic heart failure (HCC)    Diabetic retinopathy ([place])    GERD (gastroesophageal reflux disease)    H/O cardiovascular stress test    Nuclear study in 2011 normal perfusion   HOCM (hypertrophic obstructive cardiomyopathy) ([place])    Hyperlipidemia    Hypertension    Kidney stones    Mitral regurgitation    Mitral stenosis    Obesity    Premature atrial contractions    PVC's (premature ventricular contractions)    Type II or unspecified type diabetes mellitus without mention of complication, uncontrolled 01/31/2013   Significant Hospital Events: Including procedures, antibiotic start and stop dates in addition to other pertinent events   9/18 Admitted to ICU 9/19 waking up, following commands.  Weaning vasopressors. 9/20 Diuresed, passed SBT and extubated, but reintubated for upper airway edema. 9/21 less responsive on no sedation. MRI, EEG ordered. 9/22  MRI which showed multiple embolic/septic strokes. 9/25 Marginally responsive. TEE completed with +highly mobile MV vegetation c/w endocarditis. 9/26 Grossly unchanged neurologic exam. Kerr conversation started with family 9/27 ongoing family discussions 9/28 failed SBT 2/2 significant resp distress, amio stopped given bradycardia 10/2 proned and paralyzed, IV's infiltrated, on three pressors, worsening renal failure 10/3 supinated yesterday, oxygenation improved, pressor requirement decreased however developing oliguric renal failure 10/4: Renal function worsening. Completed Zyvox  10/5 renal function continues to decline  Interim History /  Subjective:  Worse today  Objective:  Blood pressure (Abnormal) 115/53, pulse (Abnormal) 104, temperature 99.6 F (37.6 C), temperature source Oral, resp. rate (Abnormal) 35, height 5' (1.524 m), weight 112 kg, SpO2 95 %.    Vent Mode:  PRVC FiO2 (%):  [60 %] 60 % Set Rate:  [35 bmp] 35 bmp Vt Set:  [360 mL] 360 mL PEEP:  [14 cmH20] 14 cmH20 Plateau Pressure:  [19 cmH20-30 cmH20] 30 cmH20   Intake/Output Summary (Last 24 hours) at 02/17/2022 0730 Last data filed at 02/17/2022 0600 Gross per 24 hour  Intake 2840.08 ml  Output 448 ml  Net 2392.08 ml   Filed Weights   02/15/22 0345 02/16/22 0449 02/17/22 0317  Weight: 105.1 kg 108.3 kg 112 kg  General unresponsive HENT NCAT orally intubated Pulm scattered rhonchi Card reg irreg w/ freq runs of SVT and bradycardia Abd soft Ext warm  Neuro unresponsive   Assessment & Plan:   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 Plan is to complete 6 weeks rx   Prolonged OHCA- c/b Acute metabolic encephalopathy 2/2 septic emboli and worsening uremia had initially recovered, was talking following commands prior to showering of emboli  Plan Supportive care    Acute respiratory failure with hypoxia c/b VAP (NOS) History of asthma, no current wheezing Plan Full vent support VAP bundle  PAD protocol  AKI with oliguria, UOP started to drop off 9/29, now w/ progressive anion gap metabolic acidosis Hypervolemia   -seen by renal; not a dialysis candidate, CRRT would further drop BP and not improve outcome -acidosis and renal fxn cont to worsen Plan Cont bicarb Renal dose meds am chem   Paroxysmal Afib - AC on hold given inflammatory milieu in CNS and high risk for hemorrhagic conversion; Plan Tele   Thrombocytopenia, likely 2/2 antibiotics or sepsis. 4T score=4, intermediate probability. -HIT ab positive, discussed with pharmacy, SRA pending Plan No AC given embolic stroke  Anemia Plan Transfused on 10/5 and 10/6 Ck cbc am Will ck CBC again Sunday in effort to limit blood draws     DM2 with Hyperglycemia- stable Plan Ssi goal 140-180 No change in TF coverage   Stage 1 DTI  sacrum Plan Wound care     Best Practice: (right click and "Reselect all SmartList Selections" daily)   Diet/type: tube feeds DVT prophylaxis: SCD GI prophylaxis: PPI Lines: line holiday Foley:  Foley Code Status:  full code Last date of multidisciplinary goals of care discussion [10/2, family updated by Dr. Lamonte Sakai today] Family updated at bedside, I have told them she will not survive. They continue to hold out hope. Unfortunately I think we will end up providing CPR for her which I believe is Futile. Will continue current care but I will be surprised if she survives day.   CRITICAL CARE 32 min   Erick Colace ACNP-BC [place] Pager # [phone] OR # [phone] if no answer

## 2022-02-17 NOTE — Progress Notes (Signed)
   02/17/22 1400  Clinical Encounter Type  Visited With Family  Visit Type Spiritual support;Follow-up  Referral From Chaplain  Consult/Referral To Chaplain  Spiritual Encounters  Spiritual Needs Emotional;Grief support;Prayer   Chaplain met with patient's son Montine Circle providing compassionate meaningful presence and reflective living, accommodated request for prayer consistent with their faith tradition.  [place], M.Min. [phone].

## 2022-02-18 LAB — COMPREHENSIVE METABOLIC PANEL
ALT: 15 U/L (ref 0–44)
AST: 16 U/L (ref 15–41)
Albumin: 2.4 g/dL — ABNORMAL LOW (ref 3.5–5.0)
Alkaline Phosphatase: 62 U/L (ref 38–126)
Anion gap: 23 — ABNORMAL HIGH (ref 5–15)
BUN: 168 mg/dL — ABNORMAL HIGH (ref 8–23)
CO2: 16 mmol/L — ABNORMAL LOW (ref 22–32)
Calcium: 8.5 mg/dL — ABNORMAL LOW (ref 8.9–10.3)
Chloride: 95 mmol/L — ABNORMAL LOW (ref 98–111)
Creatinine, Ser: 4.99 mg/dL — ABNORMAL HIGH (ref 0.44–1.00)
GFR, Estimated: 8 mL/min — ABNORMAL LOW (ref >60)
Glucose, Bld: 110 mg/dL — ABNORMAL HIGH (ref 70–99)
Potassium: 4.9 mmol/L (ref 3.5–5.1)
Sodium: 134 mmol/L — ABNORMAL LOW (ref 135–145)
Total Bilirubin: 1 mg/dL (ref 0.3–1.2)
Total Protein: 6.5 g/dL (ref 6.5–8.1)

## 2022-02-18 LAB — CBC WITH DIFFERENTIAL/PLATELET
Abs Immature Granulocytes: 0.43 10*3/uL — ABNORMAL HIGH (ref 0.00–0.07)
Basophils Absolute: 0 10*3/uL (ref 0.0–0.1)
Basophils Relative: 0 %
Eosinophils Absolute: 0 10*3/uL (ref 0.0–0.5)
Eosinophils Relative: 0 %
HCT: 22.1 % — ABNORMAL LOW (ref 36.0–46.0)
Hemoglobin: 7.2 g/dL — ABNORMAL LOW (ref 12.0–15.0)
Immature Granulocytes: 2 %
Lymphocytes Relative: 3 %
Lymphs Abs: 0.7 10*3/uL (ref 0.7–4.0)
MCH: 29.1 pg (ref 26.0–34.0)
MCHC: 32.6 g/dL (ref 30.0–36.0)
MCV: 89.5 fL (ref 80.0–100.0)
Monocytes Absolute: 1.6 10*3/uL — ABNORMAL HIGH (ref 0.1–1.0)
Monocytes Relative: 7 %
Neutro Abs: 20.9 10*3/uL — ABNORMAL HIGH (ref 1.7–7.7)
Neutrophils Relative %: 88 %
Platelets: 110 10*3/uL — ABNORMAL LOW (ref 150–400)
RBC: 2.47 MIL/uL — ABNORMAL LOW (ref 3.87–5.11)
RDW: 22.9 % — ABNORMAL HIGH (ref 11.5–15.5)
Smear Review: NORMAL
WBC: 23.7 10*3/uL — ABNORMAL HIGH (ref 4.0–10.5)
nRBC: 11.3 % — ABNORMAL HIGH (ref 0.0–0.2)

## 2022-02-18 LAB — GLUCOSE, CAPILLARY
Glucose-Capillary: 111 mg/dL — ABNORMAL HIGH (ref 70–99)
Glucose-Capillary: 98 mg/dL (ref 70–99)
Glucose-Capillary: 84 mg/dL (ref 70–99)
Glucose-Capillary: 104 mg/dL — ABNORMAL HIGH (ref 70–99)
Glucose-Capillary: 123 mg/dL — ABNORMAL HIGH (ref 70–99)

## 2022-02-18 LAB — POCT I-STAT 7, (LYTES, BLD GAS, ICA,H+H)
Acid-base deficit: 15 mmol/L — ABNORMAL HIGH (ref 0.0–2.0)
Bicarbonate: 14.7 mmol/L — ABNORMAL LOW (ref 20.0–28.0)
Calcium, Ion: 1.07 mmol/L — ABNORMAL LOW (ref 1.15–1.40)
HCT: 23 % — ABNORMAL LOW (ref 36.0–46.0)
Hemoglobin: 7.8 g/dL — ABNORMAL LOW (ref 12.0–15.0)
O2 Saturation: 71 %
Patient temperature: 100.2
Potassium: 5.2 mmol/L — ABNORMAL HIGH (ref 3.5–5.1)
Sodium: 128 mmol/L — ABNORMAL LOW (ref 135–145)
TCO2: 16 mmol/L — ABNORMAL LOW (ref 22–32)
pCO2 arterial: 54.4 mmHg — ABNORMAL HIGH (ref 32–48)
pH, Arterial: 7.045 — CL (ref 7.35–7.45)
pO2, Arterial: 57 mmHg — ABNORMAL LOW (ref 83–108)

## 2022-02-18 LAB — TRIGLYCERIDES: Triglycerides: 218 mg/dL — ABNORMAL HIGH (ref <150)

## 2022-02-18 MED ORDER — SODIUM BICARBONATE 8.4 % IV SOLN
INTRAVENOUS | Status: AC
  Administered 2022-02-18: 100 meq via INTRAVENOUS
  Filled 2022-02-18: qty 100

## 2022-02-18 MED ORDER — SODIUM CHLORIDE 0.9 % IV SOLN
2.0000 g | Freq: Two times a day (BID) | INTRAVENOUS | Status: DC
  Administered 2022-02-19 (×3): 2 g via INTRAVENOUS
  Filled 2022-02-18 (×3): qty 20

## 2022-02-18 NOTE — Progress Notes (Signed)
NAME:  Alyssa Ingram, MRN:  [id number], DOB:  [birth year], LOS: 88 ADMISSION DATE:  01/13/2022, CONSULTATION DATE:  01/29/2022 REFERRING MD:  Leonette Monarch - EDP CHIEF COMPLAINT:  PEA arrest  History of Present Illness:  76 year old woman who presented to [facility] ED 9/18 post-PEA arrest. PMHx significant for HTN, HLD, HFpEF, HOCM, asthma, T2DM, GERD, obesity.   History is provided by son who lives with the patient.   She has been feeling poorly, including weakness and malaise for several days.  Chronic leg and foot pain was worse than usual.  This was followed by several days of abdominal discomfort, though no diarrhea or nausea or vomiting.  She was incontinent of stool once which is unusual for her.  9/17 she also developed neck pain.  She usually sleeps laying flat and was able to sleep Saturday night. Since yesterday, they have seen her leaning over the sink and counter bracing herself.    She was feeling worse, becoming weaker and they had decided to take her to the ED, when she started to get worse and more altered.  911 called, and she had completely lost consciousness at that time. When EMS arrived, breathing was agonal and she was pulseless. CPR x 22 min with ROSC, King airway in the field.    Intubated on arrival to ED. Neurologically, she was doing enough to fight the tube that she did receive doses of sedation in ED. Required pressor initiation (Levophed) and broad-spectrum antibiotic coverage with Vanc/Zosyn.  On 9/19, patient was noted to be waking up and following commands. She was extubated 9/20; shortly after extubation she required reintubation for upper airway edema. On 9/21, noted to be less responsive. Underwent MRI Brain 9/22 which showed multiple embolic/septic strokes, raising concern for endocarditis. TEE 9/25 demonstrating highly mobile valvular vegetation on MV c/w endocarditis.  Pertinent Medical History:   Past Medical History:  Diagnosis Date   Abnormal MRI, spine 11/2011    ?discitis L5-S1, s/p CT bx 12/12/11   Allergic rhinitis, cause unspecified 01/31/2013   Arthritis    Asthma    Cervical cancer ([place]) 1980   Chronic diastolic heart failure (HCC)    Diabetic retinopathy ([place])    GERD (gastroesophageal reflux disease)    H/O cardiovascular stress test    Nuclear study in 2011 normal perfusion   HOCM (hypertrophic obstructive cardiomyopathy) ([place])    Hyperlipidemia    Hypertension    Kidney stones    Mitral regurgitation    Mitral stenosis    Obesity    Premature atrial contractions    PVC's (premature ventricular contractions)    Type II or unspecified type diabetes mellitus without mention of complication, uncontrolled 01/31/2013   Significant Hospital Events: Including procedures, antibiotic start and stop dates in addition to other pertinent events   9/18 Admitted to ICU 9/19 waking up, following commands.  Weaning vasopressors. 9/20 Diuresed, passed SBT and extubated, but reintubated for upper airway edema. 9/21 less responsive on no sedation. MRI, EEG ordered. 9/22  MRI which showed multiple embolic/septic strokes. 9/25 Marginally responsive. TEE completed with +highly mobile MV vegetation c/w endocarditis. 9/26 Grossly unchanged neurologic exam. [place] conversation started with family 9/27 ongoing family discussions 9/28 failed SBT 2/2 significant resp distress, amio stopped given bradycardia 10/2 proned and paralyzed, IV's infiltrated, on three pressors, worsening renal failure 10/3 supinated yesterday, oxygenation improved, pressor requirement decreased however developing oliguric renal failure 10/4: Renal function worsening. Completed Zyvox  10/5 renal function continues to decline  Interim History /  Subjective:   Condition remains unchanged. High pressor and ventilator requirements.   Objective:  Blood pressure (!) 109/49, pulse (!) 104, temperature (!) 100.6 F (38.1 C), temperature source Axillary, resp. rate (!) 35, height 5' (1.524  m), weight 120.8 kg, SpO2 90 %.    Vent Mode: PRVC FiO2 (%):  [60 %-100 %] 100 % Set Rate:  [35 bmp] 35 bmp Vt Set:  [360 mL] 360 mL PEEP:  [14 cmH20-16 cmH20] 16 cmH20 Plateau Pressure:  [30 cmH20-36 cmH20] 34 cmH20   Intake/Output Summary (Last 24 hours) at 02/18/2022 1531 Last data filed at 02/18/2022 1200 Gross per 24 hour  Intake 2250.72 ml  Output 975 ml  Net 1275.72 ml    Filed Weights   02/16/22 0449 02/17/22 0317 02/18/22 0500  Weight: 108.3 kg 112 kg 120.8 kg  General unresponsive HENT NCAT orally intubated Pulm scattered rhonchi Card reg irreg w/ freq runs of SVT and bradycardia Abd soft Ext warm  Neuro unresponsive   Assessment & Plan:   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 Plan is to complete 6 weeks rx   Prolonged OHCA- c/b Acute metabolic encephalopathy 2/2 septic emboli and worsening uremia had initially recovered, was talking following commands prior to showering of emboli  Plan Supportive care    Acute respiratory failure with hypoxia c/b VAP (NOS) History of asthma, no current wheezing Plan Full vent support VAP bundle  PAD protocol  AKI with oliguria, UOP started to drop off 9/29, now w/ progressive anion gap metabolic acidosis Hypervolemia   -seen by renal; not a dialysis candidate, CRRT would further drop BP and not improve outcome -acidosis and renal fxn cont to worsen Plan Cont bicarb Renal dose meds am chem   Paroxysmal Afib - AC on hold given inflammatory milieu in CNS and high risk for hemorrhagic conversion; Plan Tele  Thrombocytopenia, likely 2/2 antibiotics or sepsis. 4T score=4, intermediate probability. -HIT ab positive, discussed with pharmacy, SRA pending Plan No AC given embolic stroke  Anemia Plan Transfused on 10/5 and 10/6 Ck cbc am Will ck CBC again Sunday in effort to limit blood draws   DM2 with Hyperglycemia-  stable Plan Ssi goal 140-180 No change in TF coverage   Stage 1 DTI sacrum Plan Wound care   Best Practice: (right click and "Reselect all SmartList Selections" daily)   Diet/type: tube feeds DVT prophylaxis: SCD GI prophylaxis: PPI Lines: line holiday Foley:  Foley Code Status:  full code Last date of multidisciplinary goals of care discussion [10/7 family updated by Dr. Lynetta Mare today] Family updated at bedside, I have told them she will not survive. They continue to hold out hope. Unfortunately I think we will end up providing CPR for her which I believe is Futile. Will continue current care  CRITICAL CARE Performed by: Kipp Brood   Total critical care time: 35 minutes  Critical care time was exclusive of separately billable procedures and treating other patients.  Critical care was necessary to treat or prevent imminent or life-threatening deterioration.  Critical care was time spent personally by me on the following activities: development of treatment plan with patient and/or surrogate as well as nursing, discussions with consultants, evaluation of patient's response to treatment, examination of patient, obtaining history from patient or surrogate, ordering and performing treatments and interventions, ordering and review of laboratory studies, ordering and review of radiographic studies, pulse oximetry, re-evaluation of patient's condition and participation in multidisciplinary  rounds.  Kipp Brood, MD [facility] ICU Physician [place]  Pager: [phone] Mobile: [phone] After hours: [phone].

## 2022-02-18 NOTE — Progress Notes (Addendum)
eLink Physician-Brief Progress Note Patient Name: Alyssa Ingram DOB: 04-25-1946 MRN: [id number]   Date of Service  02/18/2022  HPI/Events of Note  Called d/t Sat dropping to 71% by oximetry. ABG on 100%/PRVC 35/TV 360/P 16 = 7.0/54/57/15.  From [place] Babcock's note yesterday:  "Family updated at bedside, I have told them she will not survive. They continue to hold out hope. Unfortunately I think we will end up providing CPR for her which I believe is Futile. Will continue current care but I will be surprised if she survives day."  From Dr. Serita Grit (Nephrology) note yesterday: "1.  Acute kidney injury: Likely from ischemic ATN in the setting of sepsis/shock.  Remains anuric and with worsening renal function/azotemia noted on labs.  I discussed labs, clinical findings and management/prognosis with her son [name] (and daughter Ashby Dawes on the phone).  Informed them that we would not offer dialysis/RRT as it will not make any improvement to her overall prognosis and indeed may hasten mortality." There is not much to add at this point to improve the outcome. Unable to push up PEEP d/t Ppeak = 38 and Pplat = 36. CVP = 27. The real treatment in this case would be CRRT for which she is not a candidate. We are currently at the absolute limit of being able to support this patient medically.   eICU Interventions  Plan: NaHCO3 100 meq IV now.  Bedside nurse to contact family to inform them of gravity of situation and that patient is a risk of coding at any time now that the patient is hypoxic with no good options for management.      Intervention Category Major Interventions: Acid-Base disturbance - evaluation and management;Hypoxemia - evaluation and management  Lysle Dingwall 02/18/2022, 8:55 PM

## 2022-02-18 NOTE — Progress Notes (Signed)
Patient ID: Alyssa Ingram, female   DOB: August 19, 1945, 76 y.o.   MRN: [id number] [facility] Progress Note   Assessment/ Plan:   1.  Acute kidney injury: Likely from ischemic ATN in the setting of sepsis/shock.  Remains anuric and with worsening renal function/azotemia noted on labs.  Prognosis remains poor with imminent risk for mortality with worsening azotemia/metabolic acidosis/anasarca.  I discussed my findings with her son Audry Pili at bedside and again reiterated why any modality of renal replacement therapy would not be beneficial to her overall prognosis. 2.  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  On antimicrobial therapy with cefepime and ampicillin with ongoing stress dose steroids. 3.  Acute encephalopathy: Appears to be multifactorial including septic cerebral infarcts and possibly hypoxic injury from initial arrest. 4.  Acute hypoxic respiratory failure: Arterial blood gas from this morning shows mixed respiratory and metabolic acidosis.  Ventilator adjustments per CCM. 5.  Anemia: With acute hemoglobin drop overnight and she is status post PRBC transfusion.  Subjective:   Oxygenation difficulties noted overnight with attempted diuretic therapy (furosemide 160 mg IV without notable urine response).  PEEP increased.  Objective:   BP (!) 117/50   Pulse (!) 103   Temp 99.4 F (37.4 C) (Axillary)   Resp (!) 35   Ht 5' (1.524 m)   Wt 120.8 kg   SpO2 92%   BMI 52.01 kg/m   Intake/Output Summary (Last 24 hours) at 02/18/2022 0815 Last data filed at 02/18/2022 0600 Gross per 24 hour  Intake 2007.15 ml  Output 940 ml  Net 1067.15 ml   Weight change: 8.8 kg  Physical Exam: Gen: Intubated, sedated, anasarca CVS: Pulse regular tachycardia, S1 and S2 normal Resp: Coarse breath sounds bilaterally, no rales/wheeze Abd: Soft, obese, nontender Ext: 2-3+ edema in upper and lower  extremities with supportive ankle booties  Imaging: DG CHEST PORT 1 VIEW  Result Date: 02/17/2022 CLINICAL DATA:  Enterococcal bacteremia, intubated EXAM: PORTABLE CHEST 1 VIEW COMPARISON:  02/13/2022 FINDINGS: Single frontal view of the chest demonstrates endotracheal tube overlying tracheal air column tip approximately 1.5 cm above carina. Enteric catheter passes below diaphragm tip excluded by collimation. Right internal jugular catheter tip projects over the right atrium. The cardiac silhouette is enlarged. There are worsening bilateral areas of airspace disease, as well as likely small bilateral pleural effusions. No pneumothorax. IMPRESSION: 1. Support devices as above. 2. Progressive bilateral airspace disease with small bilateral pleural effusions. This could reflect edema or widespread pneumonia. Electronically Signed   By: Randa Ngo M.D.   On: 02/17/2022 20:17    Labs: [employer] Recent Labs  Lab 02/12/22 0842 02/12/22 1539 02/13/22 0423 02/13/22 0650 02/13/22 1629 02/14/22 0056 02/14/22 0338 02/14/22 0458 02/15/22 0341 02/15/22 0343 02/15/22 1440 02/16/22 0407 02/17/22 0355 02/17/22 0525 02/17/22 2033 02/17/22 2323 02/18/22 0447  NA 136   < > 135   < > 134*   < > 134*   < > 131*   < > 130* 132* 133* 131* 130* 130* 134*  K 4.7   < > 5.3*   < > 5.2*   < > 5.2*   < > 4.7   < > 4.6 4.7 4.8 4.7 4.8 4.7 4.9  CL 99  --  99  --  97*  --  93*  --  93*  --  92* 92* 94*  --   --   --  95*  CO2  18*  --  19*  --  20*  --  18*  --  16*  --  17* 17* 16*  --   --   --  16*  GLUCOSE 151*  --  182*  --  212*  --  152*  --  262*  --  246* 210* 113*  --   --   --  110*  BUN 107*  --  114*  --  118*  --  118*  --  135*  --  134* 144* 153*  --   --   --  168*  CREATININE 2.61*  --  3.15*  --  3.34*  --  3.33*  --  3.86*  --  4.06* 4.21* 4.59*  --   --   --  4.99*  CALCIUM 8.7*  --  8.4*  --  8.2*  --  8.4*  --  8.1*  --  7.8* 8.0* 8.4*  --   --   --  8.5*  PHOS 9.2*  --  >30.0*  --   --   --   11.7*  --   --   --   --   --   --   --   --   --   --    < > = values in this interval not displayed.   CBC Recent Labs  Lab 02/15/22 0458 02/15/22 1330 02/16/22 0407 02/16/22 1059 02/17/22 0355 02/17/22 0525 02/17/22 1630 02/17/22 2033 02/17/22 2323 02/18/22 0447  WBC 28.4*  --  24.5*  --  23.9*  --   --   --   --  23.7*  NEUTROABS 26.1*  --  22.6*  --  21.5*  --   --   --   --  20.9*  HGB 6.1*   < > 6.8*   < > 7.6*   < > 7.4* 9.2* 8.5* 7.2*  HCT 19.0*   < > 19.8*   < > 23.4*   < > 23.3* 27.0* 25.0* 22.1*  MCV 88.0  --  85.3  --  87.6  --   --   --   --  89.5  PLT 77*  --  84*  --  86*  --   --   --   --  110*   < > = values in this interval not displayed.    Medications:     sodium chloride   Intravenous Once   artificial tears  1 Application Both Eyes N4O   Chlorhexidine Gluconate Cloth  6 each Topical Daily   etomidate  20 mg Intravenous Once   feeding supplement (PROSource TF20)  60 mL Per Tube BID   fentaNYL (SUBLIMAZE) injection  200 mcg Intravenous Once   hydrocerin   Topical BID   insulin aspart  0-9 Units Subcutaneous Q4H   insulin aspart  8 Units Subcutaneous Q4H   lidocaine  2 patch Transdermal Q24H   midazolam  5 mg Intravenous Once   mouth rinse  15 mL Mouth Rinse Q2H   pantoprazole  40 mg Per Tube Daily   patiromer  8.4 g Oral Q8H   sodium bicarbonate  2,600 mg Per Tube TID   sodium chloride flush  10-40 mL Intracatheter Q12H   Thrombi-Pad  1 each Topical Once    Elmarie Shiley, MD 02/18/2022, 8:15 AM

## 2022-02-18 NOTE — Progress Notes (Signed)
Pharmacy Antibiotic Note  Alyssa Ingram is a 76 y.o. female admitted on 02/09/2022 after PEA arrest at home. Pt on Rocephin and Ampicillin for enterococcal endocarditis. On 10/1 expanded coverage to include HCAP. Pharmacy has been consulted for Cefepime dosing. Zyvox also started 10/1 > 10.4.   Now s/p 7 days of cefepime, will transition back to ceftriaxone to complete endocarditis treatment (ends 11/1)  Plan: Ceftriaxone 2g q 12 hrs. Ampicillin 2gm IV q8h Will continue to f/u renal function, micro data, and pt's clinical condition  Height: 5' (152.4 cm) Weight: 120.8 kg (266 lb 5.1 oz) IBW/kg (Calculated) : 45.5  Temp (24hrs), Avg:99.9 F (37.7 C), Min:99.1 F (37.3 C), Max:100.6 F (38.1 C)  Recent Labs  Lab 02/13/22 1141 02/13/22 1629 02/15/22 0341 02/15/22 0458 02/15/22 1440 02/16/22 0407 02/17/22 0355 02/18/22 0447  WBC  --    < > 28.2* 28.4*  --  24.5* 23.9* 23.7*  CREATININE  --    < > 3.86*  --  4.06* 4.21* 4.59* 4.99*  LATICACIDVEN 1.1  --   --   --   --   --   --   --    < > = values in this interval not displayed.     Estimated Creatinine Clearance: 11.4 mL/min (A) (by C-G formula based on SCr of 4.99 mg/dL (H)).    Allergies  Allergen Reactions   Sulfa Antibiotics Hives   Heparin     HIT antibody + 10/2, SRA positive 10/5   Metformin And Related Other (See Comments)    intolerance   Prednisone Swelling    Antimicrobials this admission:  Azithro 9/18 >9/19 Vanco 9/18 > 9/19 Zosyn 9/18 >9/19  Unasyn 9/19>9/22 Ampicillin 9/22>> (11/1) Ceftriaxone 9/22>> 10/1; 10/7 > (11/1) Cefepime 10/1 > 10/7 Linezolid >>10/4  Microbiology results:  9/18 BCx x 2 > e faecalis (no vanc resistance detected)  9/18 MRSA - neg 9/20 bld x2: neg 9/23 Resp Cx > neg 10/1 resp > neg  Thank you for allowing pharmacy to be a part of this patient's care.  [place] Crane, Roylene Reason, BCCP Clinical Pharmacist  02/18/2022 2:32 PM   [facility] pharmacy phone numbers are  listed on [email or web address]

## 2022-02-18 NOTE — Progress Notes (Signed)
Family contacted regarding the gravity of pt condition. Blood ph, oxygen level and possibility of coding at any time discussed. Family states they will come to the bedside.

## 2022-02-19 DIAGNOSIS — I469 Cardiac arrest, cause unspecified: Secondary | ICD-10-CM | POA: Diagnosis not present

## 2022-02-19 LAB — CBC WITH DIFFERENTIAL/PLATELET
Abs Immature Granulocytes: 1.13 10*3/uL — ABNORMAL HIGH (ref 0.00–0.07)
Basophils Absolute: 0.1 10*3/uL (ref 0.0–0.1)
Basophils Relative: 0 %
Eosinophils Absolute: 0.1 10*3/uL (ref 0.0–0.5)
Eosinophils Relative: 0 %
HCT: 20.1 % — ABNORMAL LOW (ref 36.0–46.0)
Hemoglobin: 6.4 g/dL — CL (ref 12.0–15.0)
Immature Granulocytes: 5 %
Lymphocytes Relative: 5 %
Lymphs Abs: 1.2 10*3/uL (ref 0.7–4.0)
MCH: 29.4 pg (ref 26.0–34.0)
MCHC: 31.8 g/dL (ref 30.0–36.0)
MCV: 92.2 fL (ref 80.0–100.0)
Monocytes Absolute: 1.6 10*3/uL — ABNORMAL HIGH (ref 0.1–1.0)
Monocytes Relative: 7 %
Neutro Abs: 20.2 10*3/uL — ABNORMAL HIGH (ref 1.7–7.7)
Neutrophils Relative %: 83 %
Platelets: 97 10*3/uL — ABNORMAL LOW (ref 150–400)
RBC: 2.18 MIL/uL — ABNORMAL LOW (ref 3.87–5.11)
RDW: 23.4 % — ABNORMAL HIGH (ref 11.5–15.5)
WBC: 24.2 10*3/uL — ABNORMAL HIGH (ref 4.0–10.5)
nRBC: 19.6 % — ABNORMAL HIGH (ref 0.0–0.2)

## 2022-02-19 LAB — POCT I-STAT 7, (LYTES, BLD GAS, ICA,H+H)
Acid-base deficit: 19 mmol/L — ABNORMAL HIGH (ref 0.0–2.0)
Acid-base deficit: 25 mmol/L — ABNORMAL HIGH (ref 0.0–2.0)
Acid-base deficit: 26 mmol/L — ABNORMAL HIGH (ref 0.0–2.0)
Bicarbonate: 11.9 mmol/L — ABNORMAL LOW (ref 20.0–28.0)
Bicarbonate: 8.2 mmol/L — ABNORMAL LOW (ref 20.0–28.0)
Bicarbonate: 6.9 mmol/L — ABNORMAL LOW (ref 20.0–28.0)
Calcium, Ion: 1.04 mmol/L — ABNORMAL LOW (ref 1.15–1.40)
Calcium, Ion: 1.04 mmol/L — ABNORMAL LOW (ref 1.15–1.40)
Calcium, Ion: 1.02 mmol/L — ABNORMAL LOW (ref 1.15–1.40)
HCT: 22 % — ABNORMAL LOW (ref 36.0–46.0)
HCT: 20 % — ABNORMAL LOW (ref 36.0–46.0)
HCT: 17 % — ABNORMAL LOW (ref 36.0–46.0)
Hemoglobin: 7.5 g/dL — ABNORMAL LOW (ref 12.0–15.0)
Hemoglobin: 6.8 g/dL — CL (ref 12.0–15.0)
Hemoglobin: 5.8 g/dL — CL (ref 12.0–15.0)
O2 Saturation: 79 %
O2 Saturation: 70 %
O2 Saturation: 59 %
Patient temperature: 98
Patient temperature: 94.5
Potassium: 5.4 mmol/L — ABNORMAL HIGH (ref 3.5–5.1)
Potassium: 6.5 mmol/L (ref 3.5–5.1)
Potassium: 7.8 mmol/L (ref 3.5–5.1)
Sodium: 130 mmol/L — ABNORMAL LOW (ref 135–145)
Sodium: 129 mmol/L — ABNORMAL LOW (ref 135–145)
Sodium: 128 mmol/L — ABNORMAL LOW (ref 135–145)
TCO2: 14 mmol/L — ABNORMAL LOW (ref 22–32)
TCO2: 10 mmol/L — ABNORMAL LOW (ref 22–32)
TCO2: 8 mmol/L — ABNORMAL LOW (ref 22–32)
pCO2 arterial: 54.4 mmHg — ABNORMAL HIGH (ref 32–48)
pCO2 arterial: 54.3 mmHg — ABNORMAL HIGH (ref 32–48)
pCO2 arterial: 44.1 mmHg (ref 32–48)
pH, Arterial: 6.947 — CL (ref 7.35–7.45)
pH, Arterial: 6.786 — CL (ref 7.35–7.45)
pH, Arterial: 6.784 — CL (ref 7.35–7.45)
pO2, Arterial: 68 mmHg — ABNORMAL LOW (ref 83–108)
pO2, Arterial: 67 mmHg — ABNORMAL LOW (ref 83–108)
pO2, Arterial: 51 mmHg — ABNORMAL LOW (ref 83–108)

## 2022-02-19 LAB — PHOSPHORUS: Phosphorus: >30 mg/dL — ABNORMAL HIGH (ref 2.5–4.6)

## 2022-02-19 LAB — GLUCOSE, CAPILLARY
Glucose-Capillary: 103 mg/dL — ABNORMAL HIGH (ref 70–99)
Glucose-Capillary: 118 mg/dL — ABNORMAL HIGH (ref 70–99)
Glucose-Capillary: 130 mg/dL — ABNORMAL HIGH (ref 70–99)
Glucose-Capillary: 131 mg/dL — ABNORMAL HIGH (ref 70–99)
Glucose-Capillary: 39 mg/dL — CL (ref 70–99)
Glucose-Capillary: 56 mg/dL — ABNORMAL LOW (ref 70–99)
Glucose-Capillary: 65 mg/dL — ABNORMAL LOW (ref 70–99)
Glucose-Capillary: 75 mg/dL (ref 70–99)
Glucose-Capillary: 79 mg/dL (ref 70–99)
Glucose-Capillary: <10 mg/dL — CL (ref 70–99)

## 2022-02-19 LAB — BASIC METABOLIC PANEL
Anion gap: 31 — ABNORMAL HIGH (ref 5–15)
BUN: 172 mg/dL — ABNORMAL HIGH (ref 8–23)
CO2: 17 mmol/L — ABNORMAL LOW (ref 22–32)
Calcium: 8.4 mg/dL — ABNORMAL LOW (ref 8.9–10.3)
Chloride: 89 mmol/L — ABNORMAL LOW (ref 98–111)
Creatinine, Ser: 5.22 mg/dL — ABNORMAL HIGH (ref 0.44–1.00)
GFR, Estimated: 8 mL/min — ABNORMAL LOW (ref >60)
Glucose, Bld: 113 mg/dL — ABNORMAL HIGH (ref 70–99)
Potassium: 5.2 mmol/L — ABNORMAL HIGH (ref 3.5–5.1)
Sodium: 137 mmol/L (ref 135–145)

## 2022-02-19 LAB — MAGNESIUM: Magnesium: 2.7 mg/dL — ABNORMAL HIGH (ref 1.7–2.4)

## 2022-02-19 MED ORDER — SODIUM BICARBONATE 8.4 % IV SOLN
INTRAVENOUS | Status: DC
  Filled 2022-02-19: qty 1000

## 2022-02-19 MED ORDER — SODIUM ZIRCONIUM CYCLOSILICATE 10 G PO PACK: 10.0000 g | PACK | Freq: Once | ORAL | Status: DC

## 2022-02-19 MED ORDER — PATIROMER SORBITEX CALCIUM 8.4 G PO PACK
8.4000 g | PACK | Freq: Four times a day (QID) | ORAL | Status: DC
  Administered 2022-02-19 (×2): 8.4 g via ORAL
  Filled 2022-02-19 (×5): qty 1

## 2022-02-19 MED ORDER — DEXTROSE 50 % IV SOLN
INTRAVENOUS | Status: AC
  Administered 2022-02-19: 50 mL
  Filled 2022-02-19: qty 50

## 2022-02-19 MED ORDER — SODIUM BICARBONATE 8.4 % IV SOLN: 100.0000 meq | Freq: Once | INTRAVENOUS | Status: AC

## 2022-02-19 MED ORDER — DEXTROSE 50 % IV SOLN
INTRAVENOUS | Status: AC
  Filled 2022-02-19: qty 50

## 2022-02-19 MED ORDER — DEXTROSE 50 % IV SOLN
25.0000 g | INTRAVENOUS | Status: AC
  Administered 2022-02-19: 25 g via INTRAVENOUS

## 2022-02-19 MED ORDER — SODIUM BICARBONATE 8.4 % IV SOLN
INTRAVENOUS | Status: AC
  Filled 2022-02-19: qty 100

## 2022-02-19 MED ORDER — DEXTROSE 50 % IV SOLN: 50.0000 mL | Freq: Once | INTRAVENOUS | Status: DC

## 2022-02-19 MED ORDER — SODIUM BICARBONATE 8.4 % IV SOLN
INTRAVENOUS | Status: AC
  Filled 2022-02-19: qty 50

## 2022-02-19 MED ORDER — SODIUM BICARBONATE 8.4 % IV SOLN: 50.0000 meq | Freq: Once | INTRAVENOUS | Status: AC

## 2022-02-19 MED ORDER — INSULIN ASPART 100 UNIT/ML IV SOLN: 10.0000 [IU] | Freq: Once | INTRAVENOUS | Status: DC

## 2022-02-19 MED ORDER — SODIUM BICARBONATE 8.4 % IV SOLN
INTRAVENOUS | Status: AC
  Administered 2022-02-19: 100 meq via INTRAVENOUS
  Filled 2022-02-19: qty 100

## 2022-02-19 MED ORDER — SODIUM BICARBONATE 8.4 % IV SOLN
100.0000 meq | Freq: Once | INTRAVENOUS | Status: AC
  Administered 2022-02-19: 100 meq via INTRAVENOUS

## 2022-02-19 MED ORDER — SODIUM BICARBONATE 8.4 % IV SOLN
INTRAVENOUS | Status: AC
  Administered 2022-02-19: 50 meq via INTRAVENOUS
  Filled 2022-02-19: qty 50

## 2022-02-19 MED ORDER — SODIUM CHLORIDE 0.9 % IV SOLN
1.0000 g | Freq: Once | INTRAVENOUS | Status: DC
  Filled 2022-02-19: qty 10

## 2022-02-19 MED ORDER — SODIUM BICARBONATE 8.4 % IV SOLN
INTRAVENOUS | Status: AC
  Filled 2022-02-19: qty 200

## 2022-02-19 MED ORDER — SODIUM BICARBONATE 8.4 % IV SOLN: 50.0000 meq | Freq: Once | INTRAVENOUS | Status: DC

## 2022-02-20 LAB — CBC
HCT: 15.6 % — ABNORMAL LOW (ref 36.0–46.0)
Hemoglobin: 4.7 g/dL — CL (ref 12.0–15.0)
MCH: 29.4 pg (ref 26.0–34.0)
MCHC: 30.1 g/dL (ref 30.0–36.0)
MCV: 97.5 fL (ref 80.0–100.0)
Platelets: 66 10*3/uL — ABNORMAL LOW (ref 150–400)
RBC: 1.6 MIL/uL — ABNORMAL LOW (ref 3.87–5.11)
RDW: 24.3 % — ABNORMAL HIGH (ref 11.5–15.5)
WBC: 18.3 10*3/uL — ABNORMAL HIGH (ref 4.0–10.5)
nRBC: 35.1 % — ABNORMAL HIGH (ref 0.0–0.2)

## 2022-02-20 LAB — BASIC METABOLIC PANEL
Anion gap: 35 — ABNORMAL HIGH (ref 5–15)
BUN: 169 mg/dL — ABNORMAL HIGH (ref 8–23)
CO2: 17 mmol/L — ABNORMAL LOW (ref 22–32)
Calcium: 9.8 mg/dL (ref 8.9–10.3)
Chloride: 95 mmol/L — ABNORMAL LOW (ref 98–111)
Creatinine, Ser: 5.32 mg/dL — ABNORMAL HIGH (ref 0.44–1.00)
GFR, Estimated: 8 mL/min — ABNORMAL LOW (ref >60)
Glucose, Bld: 137 mg/dL — ABNORMAL HIGH (ref 70–99)
Potassium: >7.5 mmol/L (ref 3.5–5.1)
Sodium: 147 mmol/L — ABNORMAL HIGH (ref 135–145)

## 2022-02-20 LAB — LACTIC ACID, PLASMA: Lactic Acid, Venous: >9 mmol/L (ref 0.5–1.9)

## 2022-02-20 LAB — MAGNESIUM: Magnesium: 3.1 mg/dL — ABNORMAL HIGH (ref 1.7–2.4)

## 2022-02-20 NOTE — Progress Notes (Signed)
   Feb 24, 2022 2306  Clinical Encounter Type  Visited With Family  Visit Type Initial;Spiritual support;Code;Patient actively dying  Referral From Nurse  Consult/Referral To Chaplain   Chaplain responded to a code blue. Patient was under the care of the medical team until her death was called.  Family was present and supporting one another through the process. If a chaplain is requested someone will respond.   Danice Goltz [facility]  [phone]

## 2022-03-13 NOTE — Discharge Summary (Signed)
DEATH SUMMARY   Patient Details  Name: Alyssa Ingram MRN: [id number] DOB: 25-Feb-1946  Admission/Discharge Information   Admit Date:  Feb 03, 2022  Date of Death: Date of Death: 02/23/2022  Time of Death: Time of Death: 06/29/45  Length of Stay: 07-09-22  Referring Physician: [name], MD   Reason(s) for Hospitalization  Cardiac arrest  Diagnoses  Preliminary cause of death: Septic shock from enterococcal bacteremia.  Secondary Diagnoses (including complications and co-morbidities):  Principal Problem:   Enterococcal bacteremia Active Problems:   AKI (acute kidney injury) ([place])   Acute respiratory failure with hypoxia (HCC)   Pressure injury of skin   Septic embolism (HCC)   Acute bacterial endocarditis   Encephalopathy acute   On mechanically assisted ventilation (HCC)   Goals of care, counseling/discussion   ARDS (adult respiratory distress syndrome) ([place])   Shock ([place])   Brief Hospital Course (including significant findings, care, treatment, and services provided and events leading to death)  Alyssa Ingram is a 76 y.o. year old female who presented following a cardiac arrest at home with 22 min downtime (non-shockable rhythm).  She had been complaining of several days of generalized malaise.   She had a background history of HFpEF with HOCM physiology. Obesity, asthma, metabolic syndrome.   On presentation she required mechanical ventilation and was on vasopressors. She has bilateral lung infiltrates which were thought to be either pulmonary edema or aspiration pneumonia.  She was started on diuretics and Unasyn for aspiration pneumonia. Blood cultures were, surprisingly positive for enterococcus sensitive to ampicillin in one bottle only. There was no obvious source, this was felt to be potentially from aspiration gastric contents. A CT abdomen revealed no source of infection or abscess despite persistent leukocytosis. Echocardiogram showed normal LVSF and severe MAC and AV  calcification. No vegetations were noted at that time .   By this time pressor requirements had improved and the patient was able to follow commands. With diuresis, she eventually was able to pass SBT and extubation was attempted. Her voice was hoarse, so steroids were given and she was able to say a few words. She then became increasingly distressed and was reintubated that same day. Because she remained neurologically flat that evening a CT was performed which was negative, but given the lack of improvement by the next morning an MRI was done which demonstrated multiple acute infarcts with separate areas of microhemorrhage. Included in the differential were evolving infarcts from the arrest and septic emboli. Indeed, a subsequent transesophageal echocardiogram demonstrated a large vegetation on the anterior mitral leaflet. ID who has been following, further adjusted antibiotics to better cover enterococcal endocarditis. Nevertheless, the patient continued to worsen: she did not improve neurologically, she developed AKI and worsening shock. She would have been to sick to consider for surgery and was probably so even on admission given recent cardiac arrest. She was also deemed to be an unsuitable candidate for renal replacement therapy.  The family did have considerable difficulty accepting her continued decline and continued to hold out hope for a recovery and did not authorize any limitations in care. The patient continued to decline from refractory acidosis and hypoxia.  The feeling is that a vegetation of that size couldn't have developed while in hospital but rather she would have presented with endocarditis. Given that she was immediately started on antibiotics which covered enterococcus, she was at least partially treated until the diagnosis was made. It is doubtful that given her multiple medical problems and pre-hospital  cardiac arrest she would have been a suitable operative candidate. Given the size  of the vegetation which was likely initially obscured by the considerable calcification of the valvular system, would have needed time to develop and was undoubtedly present at the time of admission and was a high risk of embolization at any time.     Pertinent Labs and Studies  Significant Diagnostic Studies DG CHEST PORT 1 VIEW  Result Date: 02/17/2022 CLINICAL DATA:  Enterococcal bacteremia, intubated EXAM: PORTABLE CHEST 1 VIEW COMPARISON:  02/13/2022 FINDINGS: Single frontal view of the chest demonstrates endotracheal tube overlying tracheal air column tip approximately 1.5 cm above carina. Enteric catheter passes below diaphragm tip excluded by collimation. Right internal jugular catheter tip projects over the right atrium. The cardiac silhouette is enlarged. There are worsening bilateral areas of airspace disease, as well as likely small bilateral pleural effusions. No pneumothorax. IMPRESSION: 1. Support devices as above. 2. Progressive bilateral airspace disease with small bilateral pleural effusions. This could reflect edema or widespread pneumonia. Electronically Signed   By: Randa Ngo M.D.   On: 02/17/2022 20:17   DG CHEST PORT 1 VIEW  Result Date: 02/13/2022 CLINICAL DATA:  Encounter for central line placement. EXAM: PORTABLE CHEST 1 VIEW COMPARISON:  Prior chest radiographs 02/12/2022. FINDINGS: Interval placement of a right IJ approach central venous catheter with tip terminating at the level of the superior cavoatrial junction. ET tube present with tip terminating at the level of the midthoracic trachea. An enteric tube passes below the level of the left hemidiaphragm and courses outside of the field of view, however, the tip of the tube is visualized at the expected level of the gastric fundus. Unchanged cardiomegaly. Aortic atherosclerosis. Aeration of the left upper lobe has improved. Diffuse bilateral airspace disease has otherwise not significantly changed. No evidence of  pleural effusion or pneumothorax. Spondylosis and dextrocurvature of the thoracic spine. IMPRESSION: Interval placement of a right IJ approach central venous catheter with tip terminating at the level of the superior cavoatrial junction. Additional support apparatus, as described. Improved aeration of the left upper lobe. Diffuse bilateral airspace disease has otherwise not significantly changed. Cardiomegaly. Aortic Atherosclerosis (ICD10-I70.0). Electronically Signed   By: Kellie Simmering D.O.   On: 02/13/2022 10:29   DG CHEST PORT 1 VIEW  Result Date: 02/12/2022 CLINICAL DATA:  Hypoxia. EXAM: PORTABLE CHEST 1 VIEW COMPARISON:  02/12/2022 at 8:57 a.m. FINDINGS: Bilateral airspace opacities have significantly increased from the earlier study, rapid increased consistent with pulmonary edema. Cardiac silhouette is enlarged, partly obscured by the contiguous lung opacities. Presumed bilateral effusions. No pneumothorax. Endotracheal tube and enteric tube are stable in well positioned. IMPRESSION: 1. Significant interval worsening of lung aeration consistent with progression of pulmonary edema. Electronically Signed   By: Lajean Manes M.D.   On: 02/12/2022 14:40   DG CHEST PORT 1 VIEW  Result Date: 02/12/2022 CLINICAL DATA:  Status post cardiac arrest, difficulty breathing EXAM: PORTABLE CHEST 1 VIEW COMPARISON:  Previous studies including the examination of 02/08/2022 FINDINGS: Tip of endotracheal tube is 3.1 cm above the carina. Enteric tube is noted traversing the esophagus. Transverse diameter of heart is increased. There is slight decrease in pulmonary vascular congestion. Still, there is prominence of central pulmonary vessels suggesting CHF there is prominence of interstitial markings in parahilar regions and lower lung fields suggesting mild interstitial edema. There is no new focal pulmonary consolidation. Small bilateral pleural effusions are seen, more so on the right side. There is no pneumothorax.  IMPRESSION: Cardiomegaly. There is decrease in pulmonary vascular congestion. There is residual CHF and mild interstitial pulmonary edema. Small bilateral pleural effusions, more so on the right side. Electronically Signed   By: Elmer Picker M.D.   On: 02/12/2022 11:24   US RENAL  Result Date: 02/11/2022 CLINICAL DATA:  AKI. History of renal stone seen on CT 02/01/2022. EXAM: RENAL / URINARY TRACT ULTRASOUND COMPLETE COMPARISON:  CT abdomen pelvis 02/01/2022 FINDINGS: Right Kidney: Renal measurements: 10.6 x 4.6 x 4.5 cm = volume: 115 mL. Diffuse increased echogenicity of the renal cortex. No mass or hydronephrosis visualized. A 0.9 cm echogenic focus at the lower pole of the right kidney may represent a nonobstructive calyceal stone. Left Kidney: Renal measurements: 11.4 x 5.9 x 5.2 cm = volume: 182 mL. Diffuse increased echogenicity of the renal cortex. No hydronephrosis visualized. Multiple punctate echogenic foci are seen within the renal cortex, corresponding to CT findings, and are favored to represent vascular calcifications given their peripheral location. A 0.6 cm echogenic focus located more centrally in the middle third of the left kidney may represent a nonobstructive calyceal stone. Similarly, a 1.3 cm echogenic focus in the lower pole of the left kidney may also represent a nonobstructive calyceal stone. A 2.8 cm partially exophytic anechoic cyst is noted at the lower pole of the left kidney. Bladder: Not visualized due to Foley catheter. Other: Partially visualized right pleural effusion. IMPRESSION: 1. No evidence of hydronephrosis as queried. 2. Diffuse increased echogenicity of the bilateral renal cortices, suggestive of medical renal disease. 3. Multiple punctate echogenic foci are seen within the left renal cortex, corresponding to CT findings, and are favored to represent vascular calcifications given their peripheral location. 4. A 0.9 cm echogenic focus at the lower pole of the  right kidney and 2 echogenic foci measuring up to 1.3 cm at the lower pole of the left kidney may represent nonobstructive calyceal stones. 5. Benign cortical cyst at the lower pole of the left kidney. No follow-up imaging is indicated for this finding. 6. Partially visualized right pleural effusion. Electronically Signed   By: Ileana Roup M.D.   On: 02/11/2022 16:02    Microbiology No results found for this or any previous visit (from the past 240 hour(s)).  Lab Basic Metabolic Panel: No results for input(s): "NA", "K", "CL", "CO2", "GLUCOSE", "BUN", "CREATININE", "CALCIUM", "MG", "PHOS" in the last 168 hours. Liver Function Tests: No results for input(s): "AST", "ALT", "ALKPHOS", "BILITOT", "PROT", "ALBUMIN" in the last 168 hours. No results for input(s): "LIPASE", "AMYLASE" in the last 168 hours. No results for input(s): "AMMONIA" in the last 168 hours. CBC: No results for input(s): "WBC", "NEUTROABS", "HGB", "HCT", "MCV", "PLT" in the last 168 hours. Cardiac Enzymes: No results for input(s): "CKTOTAL", "CKMB", "CKMBINDEX", "TROPONINI" in the last 168 hours. Sepsis Labs: No results for input(s): "PROCALCITON", "WBC", "LATICACIDVEN" in the last 168 hours.  Procedures/Operations  Intubation, TEE, central line, arterial line   Tameem Pullara 03/13/2022, 1:46 PM

## 2022-03-15 NOTE — Progress Notes (Incomplete)
2237 patient BP dropping increased in levo per orders son at bedside 2240 called e-link orders to increase levo and to obtain labs medications given for labs increase in levo and vaso  2250 more medications given per orders to maintain hr BP and and arrhythmias  2345 PEA CPR started multiple family members present  2347.40 time of death  Oct 30, 20230  0003 called [facility] to report time of death

## 2022-03-15 NOTE — Progress Notes (Signed)
eLink Physician-Brief Progress Note Patient Name: Alyssa Ingram DOB: [birth year] MRN: [id number]   Date of Service  2022-03-10  HPI/Events of Note  Anemia - Hgb = 6.4. Unfortunately, the last CVP = 27. Patient is already hypoxic. Therefore, not able to transfuse at this time as transfusion with PRBC at this time would only further compromise our ability to oxygenate the patient.   eICU Interventions  Continue present management.      Intervention Category Major Interventions: Other:  Quention Mcneill Cornelia Copa 03-10-2022, 4:26 AM

## 2022-03-15 NOTE — Progress Notes (Signed)
This RT responded to code blue for this pt.  Rt arrived and RT, Chelsie, was BVM'ing pt bc pt was already intubated.  Compressions were not being delivered at this time. Soon after pt coded again, losing pulses, and compressions were restarted.  Several rounds were performed and pt continued to be bagged by RT, Chelsie while this RT and several other staff did compressions until the code was ended.  Pt has now expired and family is in room.  RT will wait for further instructions.

## 2022-03-15 NOTE — Progress Notes (Signed)
Patient ID: Alyssa Ingram, female   DOB: 1945-09-17, 76 y.o.   MRN: [id number] [facility] Progress Note   Assessment/ Plan:   1.  Acute kidney injury: Likely from ischemic ATN in the setting of sepsis/shock.  Remains anuric and with worsening renal function/azotemia noted on labs.  High risk for imminent mortality/catastrophic cardiac event with worsening acidosis/hypoxia in the next 24 hours.  Will increase [place] frequency to every 6 hours.  Not a candidate for RRT based on overall prognosis.  Discussed at length with son [name] at bedside-encouraged conversion to DNR status. 2.  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  On antimicrobial therapy with cefepime and ampicillin with ongoing stress dose steroids. 3.  Acute encephalopathy: Appears to be multifactorial including septic cerebral infarcts and possibly hypoxic injury from initial arrest. 4.  Acute hypoxic respiratory failure: Arterial blood gas from this morning shows mixed respiratory and metabolic acidosis.  Ventilator adjustments per CCM. 5.  Anemia: With acute hemoglobin drop overnight and she is status post PRBC transfusion.  Subjective:   Worsening hypoxia and metabolic acidosis noted overnight.  Objective:   BP (!) 92/47   Pulse 96   Temp 99.4 F (37.4 C) (Axillary)   Resp (!) 37   Ht 5' (1.524 m)   Wt 119.9 kg   SpO2 (!) 85%   BMI 51.62 kg/m   Intake/Output Summary (Last 24 hours) at 03/12/2022 0804 Last data filed at 2022-03-12 0600 Gross per 24 hour  Intake 2586.82 ml  Output 800 ml  Net 1786.82 ml   Weight change: -0.9 kg  Physical Exam: Gen: Intubated, sedated, anasarca CVS: Pulse regular tachycardia, S1 and S2 normal Resp: Coarse breath sounds bilaterally, no rales/wheeze Abd: Soft, obese, nontender Ext: 2-3+ anasarca with supportive ankle booties  Imaging: DG CHEST PORT 1 VIEW  Result Date:  02/17/2022 CLINICAL DATA:  Enterococcal bacteremia, intubated EXAM: PORTABLE CHEST 1 VIEW COMPARISON:  02/13/2022 FINDINGS: Single frontal view of the chest demonstrates endotracheal tube overlying tracheal air column tip approximately 1.5 cm above carina. Enteric catheter passes below diaphragm tip excluded by collimation. Right internal jugular catheter tip projects over the right atrium. The cardiac silhouette is enlarged. There are worsening bilateral areas of airspace disease, as well as likely small bilateral pleural effusions. No pneumothorax. IMPRESSION: 1. Support devices as above. 2. Progressive bilateral airspace disease with small bilateral pleural effusions. This could reflect edema or widespread pneumonia. Electronically Signed   By: Randa Ngo M.D.   On: 02/17/2022 20:17    Labs: [employer] Recent Labs  Lab 02/12/22 0842 02/12/22 1539 02/13/22 0423 02/13/22 0650 02/14/22 0338 02/14/22 0458 02/15/22 0341 02/15/22 0343 02/15/22 1440 02/16/22 0407 02/17/22 0355 02/17/22 0525 02/17/22 2033 02/17/22 2323 02/18/22 0447 02/18/22 2031 03-12-2022 0259 12-Mar-2022 0333  NA 136   < > 135   < > 134*   < > 131*   < > 130* 132* 133* 131* 130* 130* 134* 128* 130* 137  K 4.7   < > 5.3*   < > 5.2*   < > 4.7   < > 4.6 4.7 4.8 4.7 4.8 4.7 4.9 5.2* 5.4* 5.2*  CL 99  --  99   < > 93*  --  93*  --  92* 92* 94*  --   --   --  95*  --   --  89*  CO2 18*  --  19*   < >  18*  --  16*  --  17* 17* 16*  --   --   --  16*  --   --  17*  GLUCOSE 151*  --  182*   < > 152*  --  262*  --  246* 210* 113*  --   --   --  110*  --   --  113*  BUN 107*  --  114*   < > 118*  --  135*  --  134* 144* 153*  --   --   --  168*  --   --  172*  CREATININE 2.61*  --  3.15*   < > 3.33*  --  3.86*  --  4.06* 4.21* 4.59*  --   --   --  4.99*  --   --  5.22*  CALCIUM 8.7*  --  8.4*   < > 8.4*  --  8.1*  --  7.8* 8.0* 8.4*  --   --   --  8.5*  --   --  8.4*  PHOS 9.2*  --  >30.0*  --  11.7*  --   --   --   --   --   --   --    --   --   --   --   --  >30.0*   < > = values in this interval not displayed.   CBC Recent Labs  Lab 02/16/22 0407 02/16/22 1059 02/17/22 0355 02/17/22 0525 02/18/22 0447 02/18/22 2031 03/17/2022 0259 Mar 17, 2022 0334  WBC 24.5*  --  23.9*  --  23.7*  --   --  24.2*  NEUTROABS 22.6*  --  21.5*  --  20.9*  --   --  20.2*  HGB 6.8*   < > 7.6*   < > 7.2* 7.8* 7.5* 6.4*  HCT 19.8*   < > 23.4*   < > 22.1* 23.0* 22.0* 20.1*  MCV 85.3  --  87.6  --  89.5  --   --  92.2  PLT 84*  --  86*  --  110*  --   --  97*   < > = values in this interval not displayed.    Medications:     sodium chloride   Intravenous Once   artificial tears  1 Application Both Eyes H8N   Chlorhexidine Gluconate Cloth  6 each Topical Daily   etomidate  20 mg Intravenous Once   feeding supplement (PROSource TF20)  60 mL Per Tube BID   fentaNYL (SUBLIMAZE) injection  200 mcg Intravenous Once   hydrocerin   Topical BID   insulin aspart  0-9 Units Subcutaneous Q4H   insulin aspart  8 Units Subcutaneous Q4H   lidocaine  2 patch Transdermal Q24H   midazolam  5 mg Intravenous Once   mouth rinse  15 mL Mouth Rinse Q2H   pantoprazole  40 mg Per Tube Daily   patiromer  8.4 g Oral Q8H   sodium bicarbonate  2,600 mg Per Tube TID   sodium chloride flush  10-40 mL Intracatheter Q12H   Thrombi-Pad  1 each Topical Once    Elmarie Shiley, MD 2022/03/17, 8:04 AM

## 2022-03-15 NOTE — Progress Notes (Signed)
NAME:  Alyssa Ingram, MRN:  [id number], DOB:  October 29, 1945, LOS: 62 ADMISSION DATE:  02/01/2022, CONSULTATION DATE:  01/24/2022 REFERRING MD:  Leonette Monarch - EDP CHIEF COMPLAINT:  PEA arrest  History of Present Illness:  76 year old woman who presented to [facility] ED 9/18 post-PEA arrest. PMHx significant for HTN, HLD, HFpEF, HOCM, asthma, T2DM, GERD, obesity.   History is provided by son who lives with the patient.   She has been feeling poorly, including weakness and malaise for several days.  Chronic leg and foot pain was worse than usual.  This was followed by several days of abdominal discomfort, though no diarrhea or nausea or vomiting.  She was incontinent of stool once which is unusual for her.  9/17 she also developed neck pain.  She usually sleeps laying flat and was able to sleep Saturday night. Since yesterday, they have seen her leaning over the sink and counter bracing herself.    She was feeling worse, becoming weaker and they had decided to take her to the ED, when she started to get worse and more altered.  911 called, and she had completely lost consciousness at that time. When EMS arrived, breathing was agonal and she was pulseless. CPR x 22 min with ROSC, [place] airway in the field.    Intubated on arrival to ED. Neurologically, she was doing enough to fight the tube that she did receive doses of sedation in ED. Required pressor initiation (Levophed) and broad-spectrum antibiotic coverage with Vanc/Zosyn.  On 9/19, patient was noted to be waking up and following commands. She was extubated 9/20; shortly after extubation she required reintubation for upper airway edema. On 9/21, noted to be less responsive. Underwent MRI Brain 9/22 which showed multiple embolic/septic strokes, raising concern for endocarditis. TEE 9/25 demonstrating highly mobile valvular vegetation on MV c/w endocarditis.  Pertinent Medical History:   Past Medical History:  Diagnosis Date   Abnormal MRI, spine 11/2011    ?discitis L5-S1, s/p CT bx 12/12/11   Allergic rhinitis, cause unspecified 01/31/2013   Arthritis    Asthma    Cervical cancer ([place]) 1980   Chronic diastolic heart failure (HCC)    Diabetic retinopathy ([place])    GERD (gastroesophageal reflux disease)    H/O cardiovascular stress test    Nuclear study in 2011 normal perfusion   HOCM (hypertrophic obstructive cardiomyopathy) ([place])    Hyperlipidemia    Hypertension    Kidney stones    Mitral regurgitation    Mitral stenosis    Obesity    Premature atrial contractions    PVC's (premature ventricular contractions)    Type II or unspecified type diabetes mellitus without mention of complication, uncontrolled 01/31/2013   Significant Hospital Events: Including procedures, antibiotic start and stop dates in addition to other pertinent events   9/18 Admitted to ICU 9/19 waking up, following commands.  Weaning vasopressors. 9/20 Diuresed, passed SBT and extubated, but reintubated for upper airway edema. 9/21 less responsive on no sedation. MRI, EEG ordered. 9/22  MRI which showed multiple embolic/septic strokes. 9/25 Marginally responsive. TEE completed with +highly mobile MV vegetation c/w endocarditis. 9/26 Grossly unchanged neurologic exam. [place] conversation started with family 9/27 ongoing family discussions 9/28 failed SBT 2/2 significant resp distress, amio stopped given bradycardia 10/2 proned and paralyzed, IV's infiltrated, on three pressors, worsening renal failure 10/3 supinated yesterday, oxygenation improved, pressor requirement decreased however developing oliguric renal failure 10/4: Renal function worsening. Completed Zyvox  10/5 renal function continues to decline  Interim History /  Subjective:   Condition remains unchanged. High pressor and ventilator requirements.   Objective:  Blood pressure (!) 92/47, pulse (!) 118, temperature 98.3 F (36.8 C), resp. rate (!) 35, height 5' (1.524 m), weight 119.9 kg, SpO2 (!) 87  %.    Vent Mode: PRVC FiO2 (%):  [100 %] 100 % Set Rate:  [35 bmp] 35 bmp Vt Set:  [360 mL] 360 mL PEEP:  [16 cmH20] 16 cmH20 Plateau Pressure:  [34 cmH20-37 cmH20] 37 cmH20   Intake/Output Summary (Last 24 hours) at 2022/03/03 1549 Last data filed at 03-Mar-2022 1300 Gross per 24 hour  Intake 2464.95 ml  Output 1200 ml  Net 1264.95 ml    Filed Weights   02/17/22 0317 02/18/22 0500 03-03-2022 0545  Weight: 112 kg 120.8 kg 119.9 kg  General unresponsive HENT NCAT orally intubated Pulm scattered rhonchi Card reg irreg w/ freq runs of SVT and bradycardia Abd soft Ext warm generalized edema. 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 Plan is to complete 6 weeks rx Currently on maximum vasopressor support.    Prolonged OHCA- c/b Acute metabolic encephalopathy 2/2 septic emboli and worsening uremia had initially recovered, was talking following commands prior to showering of emboli  Plan Supportive care    Acute respiratory failure with hypoxia c/b VAP (NOS) History of asthma, no current wheezing Plan Full vent support VAP bundle  PAD protocol  AKI with oliguria, UOP started to drop off 9/29, now w/ progressive anion gap metabolic acidosis Hypervolemia   -seen by renal; not a dialysis candidate, CRRT would further drop BP and not improve outcome -acidosis and renal fxn cont to worsen Plan Cont bicarb Renal dose meds am chem   Paroxysmal Afib - AC on hold given inflammatory milieu in CNS and high risk for hemorrhagic conversion; Plan Tele  Thrombocytopenia, likely 2/2 antibiotics or sepsis. 4T score=4, intermediate probability. -HIT ab positive, discussed with pharmacy, SRA pending Plan No AC given embolic stroke  Anemia Plan Transfused on 10/5 and 10/6 Ck cbc am Will ck CBC again Sunday in effort to limit blood draws   DM2 with  Hyperglycemia- stable Plan Ssi goal 140-180 No change in TF coverage   Stage 1 DTI sacrum Plan Wound care   Best Practice: (right click and "Reselect all SmartList Selections" daily)   Diet/type: tube feeds DVT prophylaxis: SCD GI prophylaxis: PPI Lines: line holiday Foley:  Foley Code Status:  full code Last date of multidisciplinary goals of care discussion [10/9 family updated by Dr. Lynetta Mare today] Family updated at bedside, Spoke again to family and plain once again that the patient presented with endocarditis and the vegetation that was seen on TEE did not develop while in hospital and the initial transient improvement could still be explained by antibiotics.  They appear convinced that the trial of extubation was responsible for the embolization and while it is possible that changes intrathoracic pressure may have resulted in some of this, the large vegetation which was undoubtedly present on admission was noted out of ticking time bomb.  Her frailty has been such that she has never and even at her best time being a suitable surgical candidate.  They continue to hope for miracle.  CRITICAL CARE Performed by: Kipp Brood   Total critical care time: 35 minutes  Critical care time was exclusive of separately billable procedures and treating other patients.  Critical care  was necessary to treat or prevent imminent or life-threatening deterioration.  Critical care was time spent personally by me on the following activities: development of treatment plan with patient and/or surrogate as well as nursing, discussions with consultants, evaluation of patient's response to treatment, examination of patient, obtaining history from patient or surrogate, ordering and performing treatments and interventions, ordering and review of laboratory studies, ordering and review of radiographic studies, pulse oximetry, re-evaluation of patient's condition and participation in multidisciplinary  rounds.  Kipp Brood, MD [facility] ICU Physician [place]  Pager: [phone] Mobile: [phone] After hours: [phone].

## 2022-03-15 NOTE — Progress Notes (Signed)
eLink Physician-Brief Progress Note Patient Name: Alyssa Ingram DOB: 1945-06-01 MRN: [id number]   Date of Service  2022/03/01  HPI/Events of Note  Spoke with son, [name], and daughter via video and told them that we are at the extreme limits of our ability to support [name]. We are not able to oxygenate her in spite of aggressive mechanical ventilator management. I suggested that we could continue the present level of support; however, I recommended that we do not escalate care or provide ACLS support when she coded as such care would be futile. The family understands how sick she is and desires for the patient to remain a full code in the event that her heart stops.   eICU Interventions  Continue present management.      Intervention Category Major Interventions: End of life / care limitation discussion  Lysle Dingwall 03/01/2022, 3:15 AM

## 2022-03-15 NOTE — Progress Notes (Signed)
   02/24/22 1230  Clinical Encounter Type  Visited With Patient not available;Family;Health care provider  Visit Type Critical Care;Spiritual support;Follow-up  Referral From  (Rounding)  Consult/Referral To Chaplain Albertina Parr Morene Crocker)  Recommendations Rounding  Spiritual Encounters  Spiritual Needs Emotional;Prayer;Sacred text   While rounding Chaplain stopped to visit Patient's son/[name] and daughter/[name]. With families permission Chaplain provided family with a list of Scripture verses that they might want read to Alyssa Ingram. Shared in [place]. [address] [place], [place] [phone]

## 2022-03-15 NOTE — Progress Notes (Addendum)
08/13/2235 patient BP dropping increased in levo per orders son at bedside  2240 called e-link orders to increase levo and to obtain labs medications given for labs increase in levo and vaso   2250 more medications given per orders to maintain hr BP and and arrhythmias   August 12, 2329 PEA CPR started multiple family members present   2347.40 time of death family (daughter) demands "everyone get out now" all staff leave room and allow family to have time with there loved one.  10/9/20230  0003 called [facility] to report time of death by e-link  1214/08/13 family unsure if they want autopsy completed, family informed it would be at there expense. Patient washed and cleansed per family request no lines removed until family lets [place] know if they want autopsy completed.   66  family confirms they want autopsy completed  47 family signed paper work to have autopsy completed  19 Fingerprinting competed for family  71 family left bedside and patient transported to [place]

## 2022-03-15 NOTE — Progress Notes (Addendum)
eLink Physician-Brief Progress Note Patient Name: Alyssa Ingram DOB: [birth year] MRN: [id number]   Date of Service  Mar 18, 2022  HPI/Events of Note  Notified of hypotension with BP 84/50 on levophed at 79mg/min and vasopressin.   ABG drawn and pH  at 6.786, pCO2 54.3, pO2 67. K on istat at 7.8.  Pt's HR went down into the 30s.   eICU Interventions  Give 1098m NaHCo3. Give calcium gluconate 2g IV.  Start on NaHCO3 gtt.   Tried to speak to the patient's son who is in the room but he is awaiting other siblings to arrive in the hospital in 10-15 minutes. Will continue with temporizing measures.      Intervention Category Major Interventions: Shock - evaluation and management  VaElsie [id number]:03 PM

## 2022-03-15 NOTE — Code Documentation (Signed)
  Patient Name: Alyssa Ingram   MRN: [id number]   Date of Birth/ Sex: Jun 27, 1945 , female      Admission Date: 02/10/2022  Attending Provider: Kipp Brood, MD  Primary Diagnosis: Cardiac arrest [facility]) [I46.9]   Indication: Pt was in her usual state of health until this PM, when she was noted to be in cardiac arrest. Code blue was subsequently called. At the time of arrival on scene, ACLS protocol was underway.   Technical Description:  - CPR performance duration:  17 minutes  - Was defibrillation or cardioversion used? No   - Was external pacer placed? No  - Was patient intubated pre/post CPR? Yes   Medications Administered: Y = Yes; Blank = No Amiodarone    Atropine    Calcium  2  Epinephrine  5  Lidocaine    Magnesium    Norepinephrine    Phenylephrine    Sodium bicarbonate  4 during code  Vasopressin    Other    Post CPR evaluation:  - Final Status - Was patient successfully resuscitated ? No   Miscellaneous Information:  - Time of death: 23:48   PM  - Primary team notified?  Yes  - Family Notified? Yes     Romana Juniper, MD   02/20/2022, 12:00 AM

## 2022-03-15 NOTE — Progress Notes (Signed)
Cross covering ICU physician.   Called to bedside during active code. Code team at bedside and family en route. Pt with hyperkalemia to 7.8 shocky on levo at 80 and vaso 0.03. I instructed for these to be increased to 100 and 0.04 respectively. Temporization for hyperkalemia given as well as calcium. Pt profoundly acidotic with ph 6.7 she was given numerous amps of bicarb to help with her acidosis. She has already been deemed not a dialysis candidate.   Family arrived speaking fervently about litigation on the phone and raising voice with staff and myself as I was attempting to explain her critical state. I was interrupted and not allowed to speak. Pt went asystole despite our best efforts and high quality acls/cpr was immediately initiated (see code sheet for details). We continued code for over 15 mins without any return of circulation. Family was at doorway for the entirety. Time of death was called immediately a female family member yelled at staff and told us to "get out, all of you get out". I attempted to offer [place] to redress and clean pt prior to their attendance at bedside but was dismissed.   Staff exited the room to allow family to be with pt.  Unable to answer questions or explain much to family with the consistent interruptions and continued remarks at previous care/events that occurred during pt's hospitalizations per the family perspective.   Of note as well a particularly vocal family member appeared to be under the influence and was quite odoriferous of marijuana speaking about "miscommunication with the hospital staff we want her resuscitated"

## 2022-03-15 NOTE — Progress Notes (Signed)
eLink Physician-Brief Progress Note Patient Name: OMNI DUNSWORTH DOB: 1946-04-27 MRN: [id number]   Date of Service  2022-02-23  HPI/Events of Note  ABG on 100%/PRVC 35/TV 360/P 16 = 6.947/54.4/68/11.9. We are at the limit of out being able to support this patient with mechanical ventilation. There is not much to add at this point to improve the outcome. Unable to push up PEEP d/t Ppeak = 38 and Pplat = 36. CVP = 27. The real treatment in this case would be CRRT for which she is not felt to be a candidate.  eICU Interventions  Plan: NaHCO3 100 meq IV now.      Intervention Category Major Interventions: Respiratory failure - evaluation and management;Acid-Base disturbance - evaluation and management  Anoop Hemmer [place] 02-23-22, 3:26 AM

## 2022-03-15 DEATH — deceased

## 2022-03-22 MED FILL — Medication: Qty: 1 | Status: AC

## 2022-03-23 MED FILL — Epinephrine Soln Prefilled Syringe 0.1 MG/10ML (10 MCG/ML): INTRAVENOUS | Qty: 10 | Status: AC

## 2022-03-29 IMAGING — MG DIGITAL SCREENING BILAT W/ CAD
5 series · 5 of 5 positions shown · non-contrast
Comparison: Previous exam(s).

CLINICAL DATA: Screening.

EXAM:
DIGITAL SCREENING BILATERAL MAMMOGRAM WITH CAD

[L CC]
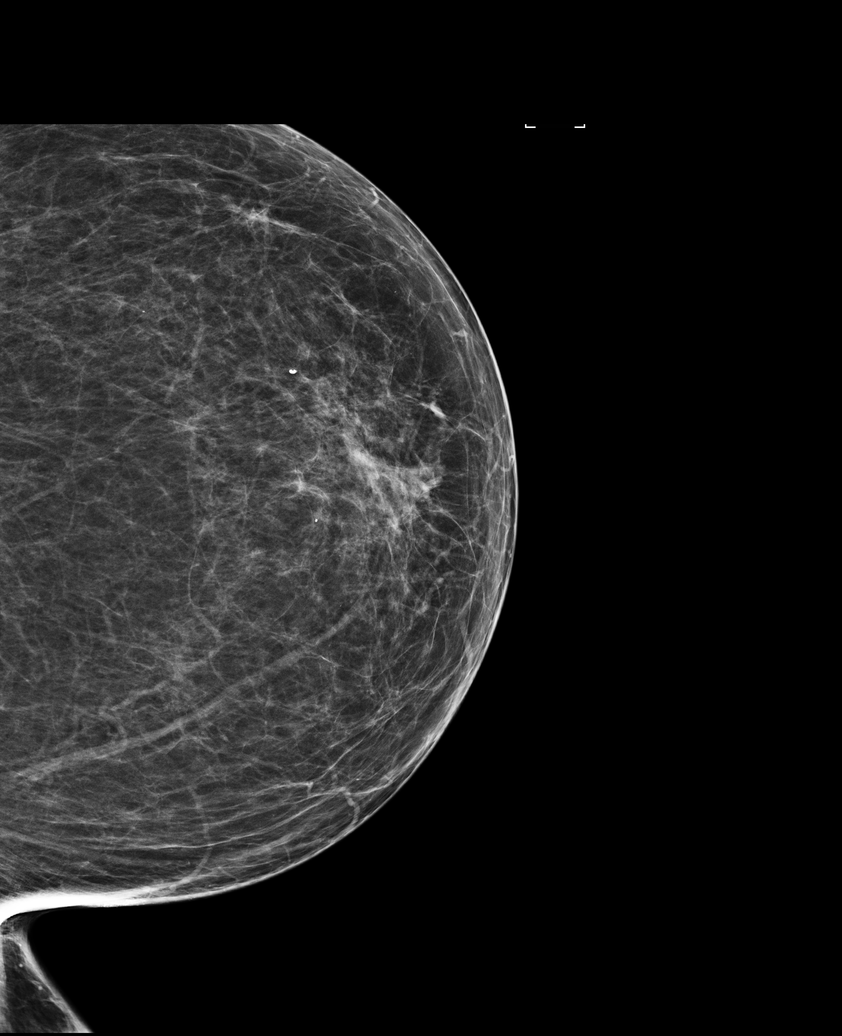

[L MLO]
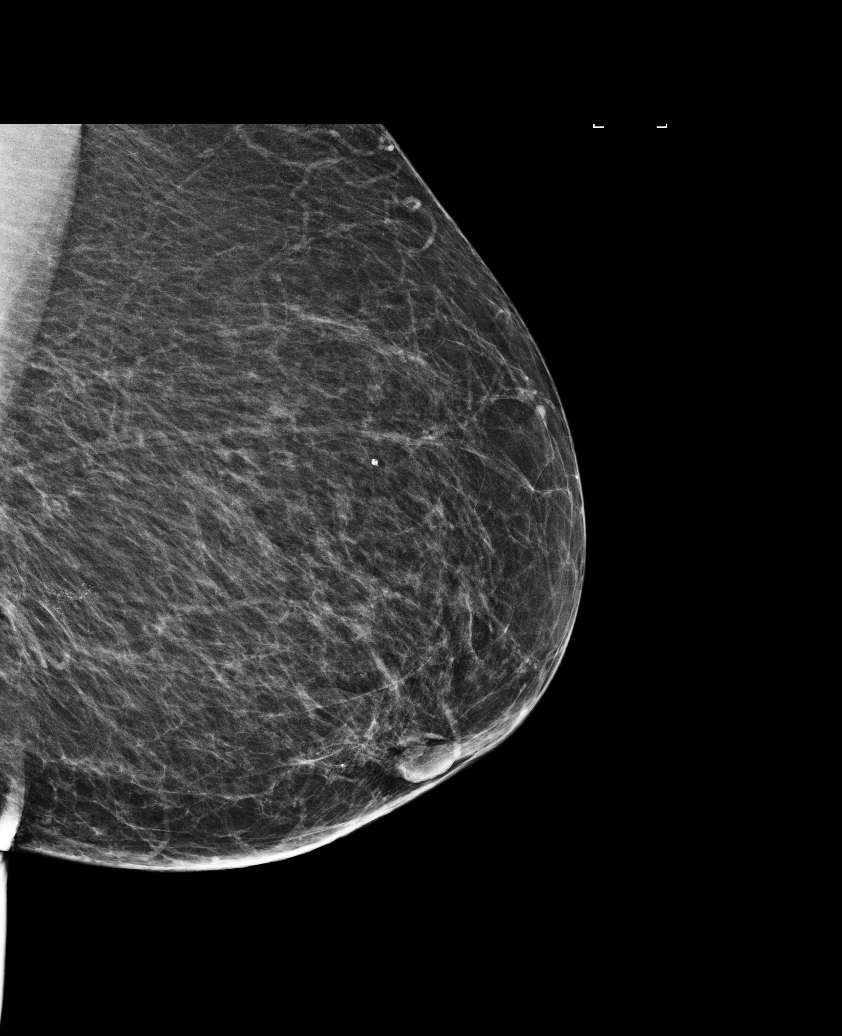

[R MLO (1 of 2)]
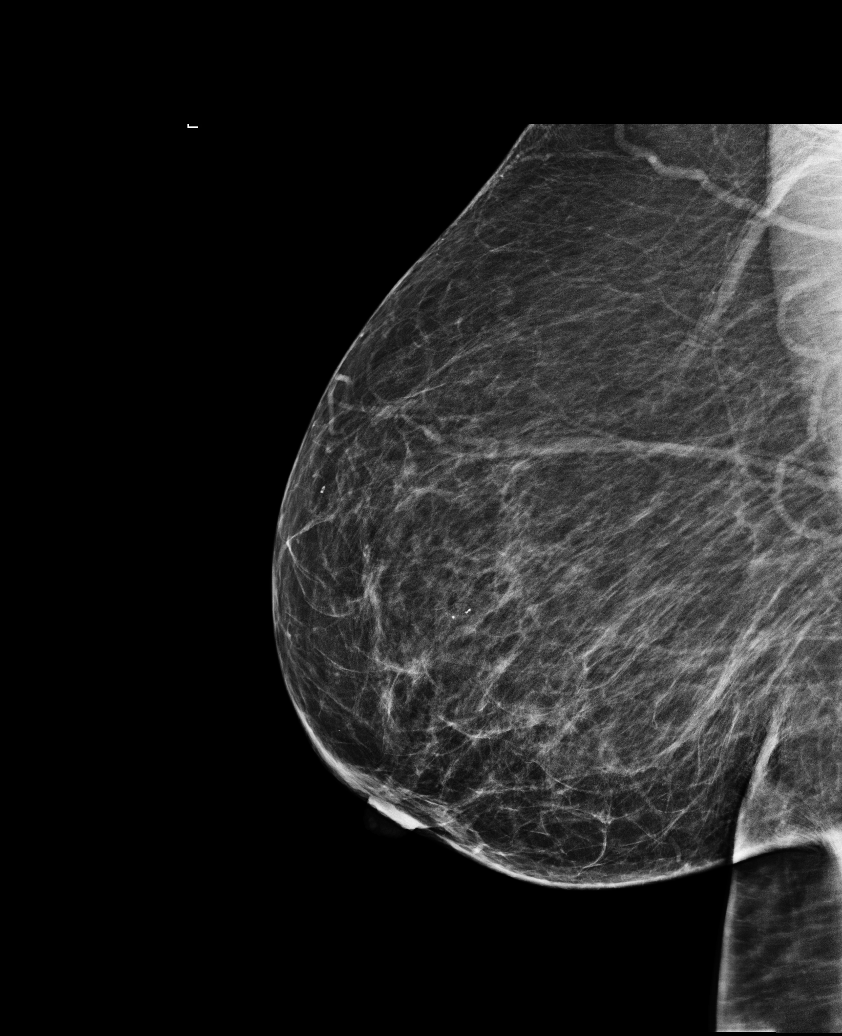

[R CC]
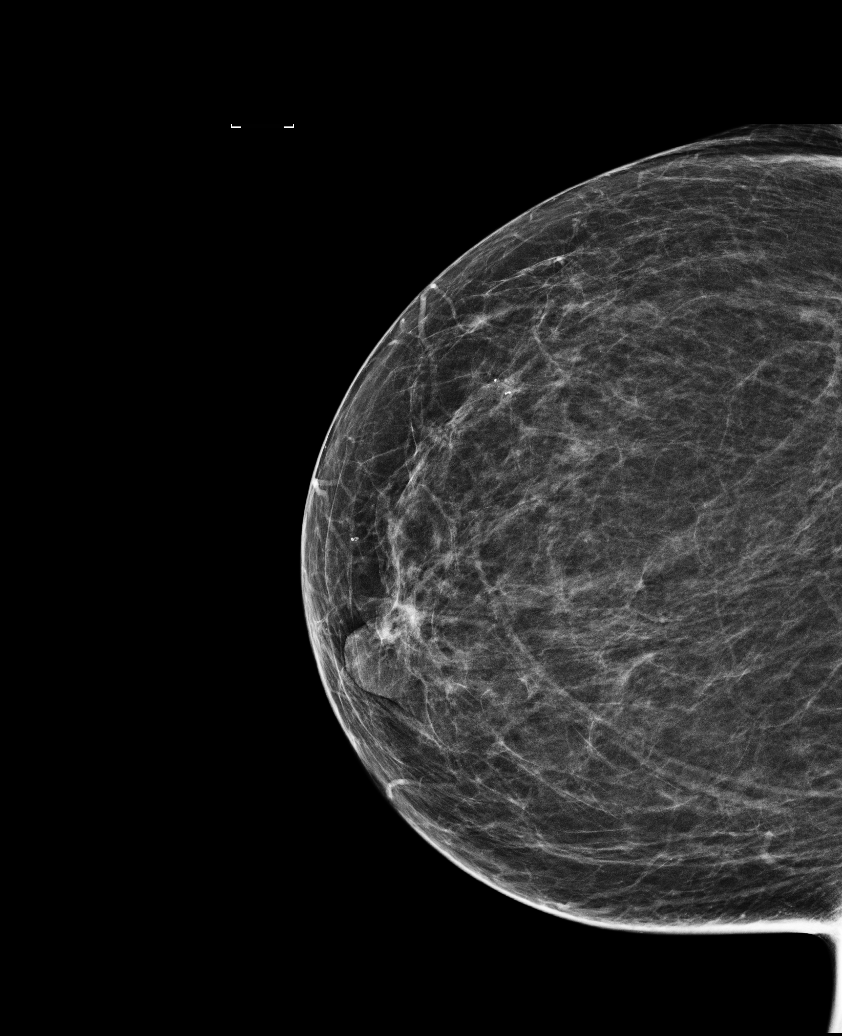

[R MLO (2 of 2)]
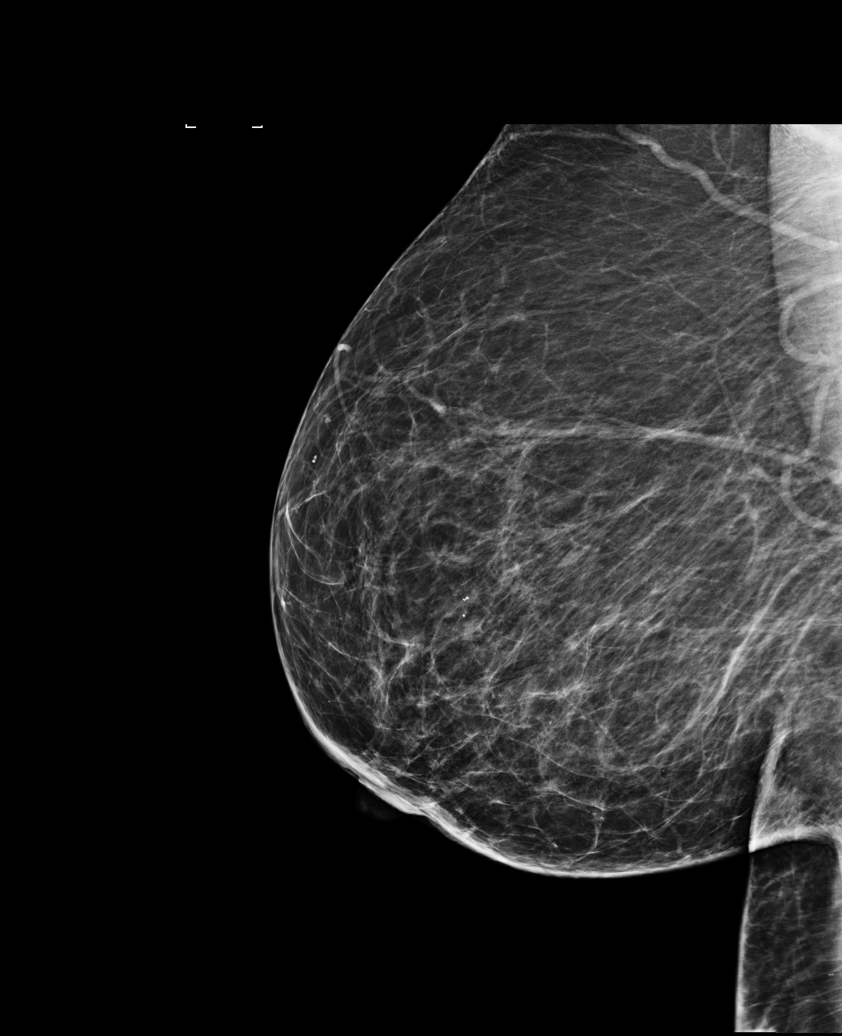

[5 of 5 positions shown; findings below may reference images not displayed]

ACR Breast Density Category b: There are scattered areas of
fibroglandular density.
FINDINGS: There are no findings suspicious for malignancy. Images were
processed with CAD.
IMPRESSION: No mammographic evidence of malignancy. A result letter of this
screening mammogram will be mailed directly to the patient.

RECOMMENDATION:
Screening mammogram in one year. (Code:AS-G-LCT)

BI-RADS CATEGORY  1: Negative.
